# Patient Record
Sex: Female | Born: 1945
Health system: Southern US, Community
[De-identification: ages and names within clinical notes are randomized; demographics above are authoritative.]

## PROBLEM LIST (undated history)

## (undated) DIAGNOSIS — K5790 Diverticulosis of intestine, part unspecified, without perforation or abscess without bleeding: Secondary | ICD-10-CM

## (undated) DIAGNOSIS — I10 Essential (primary) hypertension: Secondary | ICD-10-CM

## (undated) DIAGNOSIS — I219 Acute myocardial infarction, unspecified: Secondary | ICD-10-CM

## (undated) DIAGNOSIS — I639 Cerebral infarction, unspecified: Secondary | ICD-10-CM

## (undated) DIAGNOSIS — I739 Peripheral vascular disease, unspecified: Secondary | ICD-10-CM

## (undated) DIAGNOSIS — D509 Iron deficiency anemia, unspecified: Secondary | ICD-10-CM

## (undated) DIAGNOSIS — E785 Hyperlipidemia, unspecified: Secondary | ICD-10-CM

## (undated) DIAGNOSIS — M549 Dorsalgia, unspecified: Secondary | ICD-10-CM

## (undated) DIAGNOSIS — N182 Chronic kidney disease, stage 2 (mild): Secondary | ICD-10-CM

## (undated) DIAGNOSIS — Z9289 Personal history of other medical treatment: Secondary | ICD-10-CM

## (undated) DIAGNOSIS — G4700 Insomnia, unspecified: Secondary | ICD-10-CM

## (undated) DIAGNOSIS — N289 Disorder of kidney and ureter, unspecified: Secondary | ICD-10-CM

## (undated) DIAGNOSIS — I779 Disorder of arteries and arterioles, unspecified: Secondary | ICD-10-CM

## (undated) DIAGNOSIS — I5032 Chronic diastolic (congestive) heart failure: Secondary | ICD-10-CM

## (undated) DIAGNOSIS — K219 Gastro-esophageal reflux disease without esophagitis: Secondary | ICD-10-CM

## (undated) DIAGNOSIS — Z720 Tobacco use: Secondary | ICD-10-CM

## (undated) DIAGNOSIS — K648 Other hemorrhoids: Secondary | ICD-10-CM

## (undated) DIAGNOSIS — N183 Chronic kidney disease, stage 3 unspecified: Secondary | ICD-10-CM

## (undated) DIAGNOSIS — J45909 Unspecified asthma, uncomplicated: Secondary | ICD-10-CM

## (undated) DIAGNOSIS — K922 Gastrointestinal hemorrhage, unspecified: Secondary | ICD-10-CM

## (undated) DIAGNOSIS — I2510 Atherosclerotic heart disease of native coronary artery without angina pectoris: Secondary | ICD-10-CM

## (undated) DIAGNOSIS — G8929 Other chronic pain: Secondary | ICD-10-CM

## (undated) HISTORY — DX: Other hemorrhoids: K64.8

## (undated) HISTORY — DX: Dorsalgia, unspecified: M54.9

## (undated) HISTORY — DX: Hyperlipidemia, unspecified: E78.5

## (undated) HISTORY — DX: Other chronic pain: G89.29

## (undated) HISTORY — DX: Peripheral vascular disease, unspecified: I73.9

## (undated) HISTORY — DX: Diverticulosis of intestine, part unspecified, without perforation or abscess without bleeding: K57.90

## (undated) HISTORY — DX: Essential (primary) hypertension: I10

## (undated) HISTORY — DX: Iron deficiency anemia, unspecified: D50.9

## (undated) HISTORY — DX: Gastro-esophageal reflux disease without esophagitis: K21.9

## (undated) HISTORY — PX: TONSILLECTOMY: SUR1361

## (undated) HISTORY — DX: Insomnia, unspecified: G47.00

---

## 1973-04-02 DIAGNOSIS — Z87442 Personal history of urinary calculi: Secondary | ICD-10-CM

## 1973-04-02 HISTORY — PX: CHOLECYSTECTOMY: SHX55

## 1973-04-02 HISTORY — DX: Personal history of urinary calculi: Z87.442

## 1997-07-12 ENCOUNTER — Encounter: Admission: RE | Admit: 1997-07-12 | Discharge: 1997-07-12 | Payer: Self-pay | Admitting: Internal Medicine

## 1997-07-27 ENCOUNTER — Ambulatory Visit: Admission: RE | Admit: 1997-07-27 | Discharge: 1997-07-27 | Payer: Self-pay | Admitting: Internal Medicine

## 1997-08-25 ENCOUNTER — Encounter: Admission: RE | Admit: 1997-08-25 | Discharge: 1997-08-25 | Payer: Self-pay | Admitting: Hematology and Oncology

## 1997-09-03 ENCOUNTER — Other Ambulatory Visit: Admission: RE | Admit: 1997-09-03 | Discharge: 1997-09-03 | Payer: Self-pay | Admitting: Internal Medicine

## 2000-06-22 ENCOUNTER — Encounter: Payer: Self-pay | Admitting: Neurosurgery

## 2000-06-22 ENCOUNTER — Ambulatory Visit (HOSPITAL_COMMUNITY): Admission: RE | Admit: 2000-06-22 | Discharge: 2000-06-22 | Payer: Self-pay | Admitting: Neurosurgery

## 2004-05-28 ENCOUNTER — Emergency Department (HOSPITAL_COMMUNITY): Admission: EM | Admit: 2004-05-28 | Discharge: 2004-05-28 | Payer: Self-pay | Admitting: Emergency Medicine

## 2004-06-01 ENCOUNTER — Ambulatory Visit: Payer: Self-pay | Admitting: Family Medicine

## 2004-06-02 ENCOUNTER — Ambulatory Visit: Payer: Self-pay | Admitting: Family Medicine

## 2004-06-05 ENCOUNTER — Ambulatory Visit: Payer: Self-pay | Admitting: Family Medicine

## 2004-06-07 ENCOUNTER — Ambulatory Visit: Payer: Self-pay | Admitting: Family Medicine

## 2004-06-08 ENCOUNTER — Ambulatory Visit: Payer: Self-pay | Admitting: *Deleted

## 2004-06-28 ENCOUNTER — Ambulatory Visit: Payer: Self-pay | Admitting: Family Medicine

## 2004-07-13 ENCOUNTER — Ambulatory Visit: Payer: Self-pay | Admitting: Internal Medicine

## 2004-07-20 ENCOUNTER — Ambulatory Visit: Payer: Self-pay | Admitting: Family Medicine

## 2004-07-24 ENCOUNTER — Ambulatory Visit: Payer: Self-pay | Admitting: Family Medicine

## 2004-07-26 ENCOUNTER — Ambulatory Visit (HOSPITAL_COMMUNITY): Admission: RE | Admit: 2004-07-26 | Discharge: 2004-07-26 | Payer: Self-pay | Admitting: Internal Medicine

## 2004-08-18 ENCOUNTER — Ambulatory Visit: Payer: Self-pay | Admitting: Family Medicine

## 2005-05-17 ENCOUNTER — Ambulatory Visit: Payer: Self-pay | Admitting: Internal Medicine

## 2005-06-14 ENCOUNTER — Ambulatory Visit: Payer: Self-pay | Admitting: Family Medicine

## 2006-05-28 ENCOUNTER — Ambulatory Visit: Payer: Self-pay | Admitting: Internal Medicine

## 2006-06-11 ENCOUNTER — Ambulatory Visit: Payer: Self-pay | Admitting: Internal Medicine

## 2006-12-18 ENCOUNTER — Encounter (INDEPENDENT_AMBULATORY_CARE_PROVIDER_SITE_OTHER): Payer: Self-pay | Admitting: *Deleted

## 2007-02-26 ENCOUNTER — Ambulatory Visit: Payer: Self-pay | Admitting: Internal Medicine

## 2007-04-03 DIAGNOSIS — I219 Acute myocardial infarction, unspecified: Secondary | ICD-10-CM

## 2007-04-03 HISTORY — DX: Acute myocardial infarction, unspecified: I21.9

## 2007-08-05 ENCOUNTER — Inpatient Hospital Stay (HOSPITAL_COMMUNITY): Admission: EM | Admit: 2007-08-05 | Discharge: 2007-08-06 | Payer: Self-pay | Admitting: Emergency Medicine

## 2007-08-05 DIAGNOSIS — Z9861 Coronary angioplasty status: Secondary | ICD-10-CM

## 2007-08-05 DIAGNOSIS — I2510 Atherosclerotic heart disease of native coronary artery without angina pectoris: Secondary | ICD-10-CM

## 2007-08-05 HISTORY — DX: Atherosclerotic heart disease of native coronary artery without angina pectoris: I25.10

## 2007-08-05 HISTORY — PX: CORONARY ANGIOPLASTY WITH STENT PLACEMENT: SHX49

## 2007-09-09 ENCOUNTER — Encounter (INDEPENDENT_AMBULATORY_CARE_PROVIDER_SITE_OTHER): Payer: Self-pay | Admitting: Internal Medicine

## 2007-10-31 ENCOUNTER — Ambulatory Visit: Payer: Self-pay | Admitting: Internal Medicine

## 2007-10-31 DIAGNOSIS — J45909 Unspecified asthma, uncomplicated: Secondary | ICD-10-CM

## 2007-10-31 DIAGNOSIS — I10 Essential (primary) hypertension: Secondary | ICD-10-CM

## 2007-10-31 HISTORY — DX: Unspecified asthma, uncomplicated: J45.909

## 2007-11-11 ENCOUNTER — Encounter (INDEPENDENT_AMBULATORY_CARE_PROVIDER_SITE_OTHER): Payer: Self-pay | Admitting: Internal Medicine

## 2007-12-30 ENCOUNTER — Encounter (INDEPENDENT_AMBULATORY_CARE_PROVIDER_SITE_OTHER): Payer: Self-pay | Admitting: Internal Medicine

## 2007-12-30 ENCOUNTER — Ambulatory Visit: Payer: Self-pay | Admitting: Internal Medicine

## 2007-12-30 DIAGNOSIS — F329 Major depressive disorder, single episode, unspecified: Secondary | ICD-10-CM

## 2007-12-30 LAB — CONVERTED CEMR LAB
AST: 13 units/L (ref 0–37)
Albumin: 4.1 g/dL (ref 3.5–5.2)
BUN: 21 mg/dL (ref 6–23)
Basophils Absolute: 0 10*3/uL (ref 0.0–0.1)
Blood in Urine, dipstick: NEGATIVE
CO2: 20 meq/L (ref 19–32)
Chlamydia, DNA Probe: NEGATIVE
Chloride: 103 meq/L (ref 96–112)
GC Probe Amp, Genital: NEGATIVE
Glucose, Urine, Semiquant: NEGATIVE
HCT: 40.4 % (ref 36.0–46.0)
Hemoglobin: 12.9 g/dL (ref 12.0–15.0)
KOH Prep: NEGATIVE
Ketones, urine, test strip: NEGATIVE
Lymphs Abs: 2.9 10*3/uL (ref 0.7–4.0)
MCHC: 31.9 g/dL (ref 30.0–36.0)
Monocytes Absolute: 0.7 10*3/uL (ref 0.1–1.0)
Monocytes Relative: 8 % (ref 3–12)
Neutrophils Relative %: 59 % (ref 43–77)
RBC: 4.97 M/uL (ref 3.87–5.11)
Total Bilirubin: 0.7 mg/dL (ref 0.3–1.2)
Total CHOL/HDL Ratio: 3.2
Total Protein: 7.9 g/dL (ref 6.0–8.3)
Urobilinogen, UA: 0.2
VLDL: 23 mg/dL (ref 0–40)
pH: 5.5

## 2008-01-07 ENCOUNTER — Ambulatory Visit (HOSPITAL_COMMUNITY): Admission: RE | Admit: 2008-01-07 | Discharge: 2008-01-07 | Payer: Self-pay | Admitting: Internal Medicine

## 2008-01-11 ENCOUNTER — Encounter (INDEPENDENT_AMBULATORY_CARE_PROVIDER_SITE_OTHER): Payer: Self-pay | Admitting: Internal Medicine

## 2008-01-12 ENCOUNTER — Encounter (INDEPENDENT_AMBULATORY_CARE_PROVIDER_SITE_OTHER): Payer: Self-pay | Admitting: Internal Medicine

## 2008-01-26 ENCOUNTER — Encounter (INDEPENDENT_AMBULATORY_CARE_PROVIDER_SITE_OTHER): Payer: Self-pay | Admitting: Internal Medicine

## 2008-02-12 ENCOUNTER — Encounter (INDEPENDENT_AMBULATORY_CARE_PROVIDER_SITE_OTHER): Payer: Self-pay | Admitting: Internal Medicine

## 2008-02-19 ENCOUNTER — Encounter (INDEPENDENT_AMBULATORY_CARE_PROVIDER_SITE_OTHER): Payer: Self-pay | Admitting: Internal Medicine

## 2008-07-20 ENCOUNTER — Ambulatory Visit: Payer: Self-pay | Admitting: Internal Medicine

## 2008-07-20 DIAGNOSIS — R233 Spontaneous ecchymoses: Secondary | ICD-10-CM

## 2008-07-20 DIAGNOSIS — R1013 Epigastric pain: Secondary | ICD-10-CM

## 2008-07-30 LAB — CONVERTED CEMR LAB
ALT: 8 U/L (ref 0–35)
AST: 14 U/L (ref 0–37)
Albumin: 4.2 g/dL (ref 3.5–5.2)
Alkaline Phosphatase: 80 U/L (ref 39–117)
BUN: 29 mg/dL — ABNORMAL HIGH (ref 6–23)
Basophils Absolute: 0 K/uL (ref 0.0–0.1)
Basophils Relative: 0 % (ref 0–1)
CO2: 19 meq/L (ref 19–32)
Calcium: 9.4 mg/dL (ref 8.4–10.5)
Chloride: 109 meq/L (ref 96–112)
Cholesterol: 142 mg/dL (ref 0–200)
Creatinine, Ser: 1.47 mg/dL — ABNORMAL HIGH (ref 0.40–1.20)
Eosinophils Absolute: 0.2 K/uL (ref 0.0–0.7)
Eosinophils Relative: 3 % (ref 0–5)
Glucose, Bld: 95 mg/dL (ref 70–99)
HCT: 36.4 % (ref 36.0–46.0)
HDL: 44 mg/dL (ref >39)
Hemoglobin: 11.7 g/dL — ABNORMAL LOW (ref 12.0–15.0)
INR: 1.1 (ref 0.0–1.5)
LDL Cholesterol: 71 mg/dL (ref 0–99)
Lymphocytes Relative: 31 % (ref 12–46)
Lymphs Abs: 2.6 K/uL (ref 0.7–4.0)
MCHC: 32.1 g/dL (ref 30.0–36.0)
MCV: 82.5 fL (ref 78.0–100.0)
Monocytes Absolute: 0.6 K/uL (ref 0.1–1.0)
Monocytes Relative: 7 % (ref 3–12)
Neutro Abs: 5.1 K/uL (ref 1.7–7.7)
Neutrophils Relative %: 60 % (ref 43–77)
Platelets: 210 K/uL (ref 150–400)
Potassium: 5 meq/L (ref 3.5–5.3)
Prothrombin Time: 14.1 s (ref 11.6–15.2)
RBC: 4.41 M/uL (ref 3.87–5.11)
RDW: 16.7 % — ABNORMAL HIGH (ref 11.5–15.5)
Sodium: 141 meq/L (ref 135–145)
Total Bilirubin: 0.6 mg/dL (ref 0.3–1.2)
Total CHOL/HDL Ratio: 3.2
Total Protein: 7.9 g/dL (ref 6.0–8.3)
Triglycerides: 135 mg/dL (ref <150)
VLDL: 27 mg/dL (ref 0–40)
WBC: 8.5 10*3/microliter (ref 4.0–10.5)
aPTT: 36 s (ref 24–37)

## 2008-08-03 ENCOUNTER — Ambulatory Visit: Payer: Self-pay | Admitting: Internal Medicine

## 2008-08-09 LAB — CONVERTED CEMR LAB
BUN: 41 mg/dL — ABNORMAL HIGH (ref 6–23)
Calcium: 9.2 mg/dL (ref 8.4–10.5)
Creatinine, Ser: 1.72 mg/dL — ABNORMAL HIGH (ref 0.40–1.20)
Ferritin: 289 ng/mL (ref 10–291)
Potassium: 4.5 meq/L (ref 3.5–5.3)
Sodium: 140 meq/L (ref 135–145)
TIBC: 303 ug/dL (ref 250–470)
UIBC: 266 ug/dL

## 2008-08-10 ENCOUNTER — Ambulatory Visit: Payer: Self-pay | Admitting: Internal Medicine

## 2008-08-10 LAB — CONVERTED CEMR LAB
Glucose, Urine, Semiquant: NEGATIVE
Ketones, urine, test strip: NEGATIVE
Nitrite: NEGATIVE
WBC Urine, dipstick: NEGATIVE

## 2008-08-12 ENCOUNTER — Ambulatory Visit: Payer: Self-pay | Admitting: Internal Medicine

## 2008-08-13 ENCOUNTER — Encounter (INDEPENDENT_AMBULATORY_CARE_PROVIDER_SITE_OTHER): Payer: Self-pay | Admitting: Internal Medicine

## 2008-08-15 DIAGNOSIS — N182 Chronic kidney disease, stage 2 (mild): Secondary | ICD-10-CM | POA: Insufficient documentation

## 2008-08-17 ENCOUNTER — Ambulatory Visit: Payer: Self-pay | Admitting: Internal Medicine

## 2008-08-20 ENCOUNTER — Encounter: Payer: Self-pay | Admitting: Internal Medicine

## 2008-08-20 ENCOUNTER — Ambulatory Visit: Admission: RE | Admit: 2008-08-20 | Discharge: 2008-08-20 | Payer: Self-pay | Admitting: Internal Medicine

## 2008-08-20 ENCOUNTER — Ambulatory Visit: Payer: Self-pay | Admitting: Vascular Surgery

## 2008-08-20 ENCOUNTER — Ambulatory Visit (HOSPITAL_COMMUNITY): Admission: RE | Admit: 2008-08-20 | Discharge: 2008-08-20 | Payer: Self-pay | Admitting: Internal Medicine

## 2008-08-23 ENCOUNTER — Telehealth (INDEPENDENT_AMBULATORY_CARE_PROVIDER_SITE_OTHER): Payer: Self-pay | Admitting: Internal Medicine

## 2008-09-07 ENCOUNTER — Encounter (INDEPENDENT_AMBULATORY_CARE_PROVIDER_SITE_OTHER): Payer: Self-pay | Admitting: Internal Medicine

## 2008-09-15 ENCOUNTER — Encounter (INDEPENDENT_AMBULATORY_CARE_PROVIDER_SITE_OTHER): Payer: Self-pay | Admitting: Internal Medicine

## 2008-09-15 ENCOUNTER — Ambulatory Visit: Payer: Self-pay | Admitting: Internal Medicine

## 2008-09-23 ENCOUNTER — Ambulatory Visit: Payer: Self-pay | Admitting: Internal Medicine

## 2008-09-23 DIAGNOSIS — D509 Iron deficiency anemia, unspecified: Secondary | ICD-10-CM | POA: Insufficient documentation

## 2008-09-23 DIAGNOSIS — M549 Dorsalgia, unspecified: Secondary | ICD-10-CM

## 2008-09-23 DIAGNOSIS — G8929 Other chronic pain: Secondary | ICD-10-CM

## 2008-12-03 ENCOUNTER — Encounter (INDEPENDENT_AMBULATORY_CARE_PROVIDER_SITE_OTHER): Payer: Self-pay | Admitting: Family Medicine

## 2008-12-21 ENCOUNTER — Ambulatory Visit: Payer: Self-pay | Admitting: Internal Medicine

## 2008-12-21 DIAGNOSIS — R252 Cramp and spasm: Secondary | ICD-10-CM | POA: Insufficient documentation

## 2008-12-21 LAB — CONVERTED CEMR LAB
ALT: 9 units/L (ref 0–35)
BUN: 27 mg/dL — ABNORMAL HIGH (ref 6–23)
CO2: 22 meq/L (ref 19–32)
Calcium: 9.2 mg/dL (ref 8.4–10.5)
Creatinine, Ser: 1.58 mg/dL — ABNORMAL HIGH (ref 0.40–1.20)
Glucose, Bld: 79 mg/dL (ref 70–99)
Lymphs Abs: 2.7 10*3/uL (ref 0.7–4.0)
MCV: 84.4 fL (ref 78.0–100.0)
Monocytes Absolute: 0.7 10*3/uL (ref 0.1–1.0)
Monocytes Relative: 8 % (ref 3–12)
Neutro Abs: 4.8 10*3/uL (ref 1.7–7.7)
Sodium: 141 meq/L (ref 135–145)
Total Bilirubin: 0.6 mg/dL (ref 0.3–1.2)

## 2008-12-29 ENCOUNTER — Encounter (INDEPENDENT_AMBULATORY_CARE_PROVIDER_SITE_OTHER): Payer: Self-pay | Admitting: Internal Medicine

## 2009-01-11 ENCOUNTER — Encounter (INDEPENDENT_AMBULATORY_CARE_PROVIDER_SITE_OTHER): Payer: Self-pay | Admitting: Internal Medicine

## 2009-02-02 ENCOUNTER — Ambulatory Visit (HOSPITAL_COMMUNITY): Admission: RE | Admit: 2009-02-02 | Discharge: 2009-02-02 | Payer: Self-pay | Admitting: Internal Medicine

## 2009-02-04 ENCOUNTER — Ambulatory Visit: Payer: Self-pay | Admitting: Internal Medicine

## 2009-06-07 ENCOUNTER — Ambulatory Visit: Payer: Self-pay | Admitting: Internal Medicine

## 2009-06-07 DIAGNOSIS — E041 Nontoxic single thyroid nodule: Secondary | ICD-10-CM

## 2009-06-07 LAB — CONVERTED CEMR LAB
AST: 12 units/L (ref 0–37)
Alkaline Phosphatase: 73 units/L (ref 39–117)
Bilirubin Urine: NEGATIVE
Blood in Urine, dipstick: NEGATIVE
Calcium: 9.6 mg/dL (ref 8.4–10.5)
Chlamydia, DNA Probe: NEGATIVE
Cholesterol: 154 mg/dL (ref 0–200)
Eosinophils Absolute: 0.2 10*3/uL (ref 0.0–0.7)
Eosinophils Relative: 2 % (ref 0–5)
Glucose, Bld: 78 mg/dL (ref 70–99)
Glucose, Urine, Semiquant: NEGATIVE
HDL: 53 mg/dL (ref >39)
Hemoglobin: 12.7 g/dL (ref 12.0–15.0)
Ketones, urine, test strip: NEGATIVE
Lymphocytes Relative: 33 % (ref 12–46)
Monocytes Absolute: 0.7 10*3/uL (ref 0.1–1.0)
Monocytes Relative: 9 % (ref 3–12)
Neutro Abs: 4.2 10*3/uL (ref 1.7–7.7)
Neutrophils Relative %: 56 % (ref 43–77)
Platelets: 207 10*3/uL (ref 150–400)
Sodium: 142 meq/L (ref 135–145)
Total Bilirubin: 0.5 mg/dL (ref 0.3–1.2)
Total Protein: 7.7 g/dL (ref 6.0–8.3)
Urobilinogen, UA: 0.2
pH: 5

## 2009-06-10 ENCOUNTER — Ambulatory Visit (HOSPITAL_COMMUNITY): Admission: RE | Admit: 2009-06-10 | Discharge: 2009-06-10 | Payer: Self-pay | Admitting: Internal Medicine

## 2009-06-14 ENCOUNTER — Ambulatory Visit (HOSPITAL_COMMUNITY)
Admission: RE | Admit: 2009-06-14 | Discharge: 2009-06-14 | Payer: Self-pay | Source: Home / Self Care | Admitting: Internal Medicine

## 2009-06-26 ENCOUNTER — Encounter (INDEPENDENT_AMBULATORY_CARE_PROVIDER_SITE_OTHER): Payer: Self-pay | Admitting: Internal Medicine

## 2009-10-07 ENCOUNTER — Ambulatory Visit: Payer: Self-pay | Admitting: Internal Medicine

## 2009-10-07 DIAGNOSIS — G4700 Insomnia, unspecified: Secondary | ICD-10-CM

## 2010-01-10 ENCOUNTER — Ambulatory Visit: Payer: Self-pay | Admitting: Internal Medicine

## 2010-02-04 LAB — CONVERTED CEMR LAB
AST: 15 units/L (ref 0–37)
Albumin: 4.2 g/dL (ref 3.5–5.2)
Alkaline Phosphatase: 72 units/L (ref 39–117)
BUN: 25 mg/dL — ABNORMAL HIGH (ref 6–23)
Basophils Absolute: 0 10*3/uL (ref 0.0–0.1)
Basophils Relative: 0 % (ref 0–1)
CO2: 23 meq/L (ref 19–32)
Calcium: 9.9 mg/dL (ref 8.4–10.5)
Chloride: 106 meq/L (ref 96–112)
Creatinine, Ser: 1.63 mg/dL — ABNORMAL HIGH (ref 0.40–1.20)
Eosinophils Relative: 5 % (ref 0–5)
Glucose, Bld: 101 mg/dL — ABNORMAL HIGH (ref 70–99)
Lipase: 60 units/L (ref 0–75)
Lymphocytes Relative: 37 % (ref 12–46)
MCV: 85.6 fL (ref 78.0–100.0)
Monocytes Relative: 9 % (ref 3–12)
RDW: 16.3 % — ABNORMAL HIGH (ref 11.5–15.5)
Sodium: 141 meq/L (ref 135–145)
Total Bilirubin: 0.6 mg/dL (ref 0.3–1.2)
Total Protein: 7.5 g/dL (ref 6.0–8.3)

## 2010-03-14 ENCOUNTER — Telehealth (INDEPENDENT_AMBULATORY_CARE_PROVIDER_SITE_OTHER): Payer: Self-pay | Admitting: Internal Medicine

## 2010-03-14 ENCOUNTER — Ambulatory Visit: Payer: Self-pay | Admitting: Internal Medicine

## 2010-04-05 ENCOUNTER — Encounter (INDEPENDENT_AMBULATORY_CARE_PROVIDER_SITE_OTHER): Payer: Self-pay | Admitting: Internal Medicine

## 2010-04-11 ENCOUNTER — Ambulatory Visit (HOSPITAL_COMMUNITY)
Admission: RE | Admit: 2010-04-11 | Discharge: 2010-04-11 | Payer: Self-pay | Source: Home / Self Care | Attending: Gastroenterology | Admitting: Gastroenterology

## 2010-04-11 DIAGNOSIS — K648 Other hemorrhoids: Secondary | ICD-10-CM | POA: Insufficient documentation

## 2010-04-11 DIAGNOSIS — K5790 Diverticulosis of intestine, part unspecified, without perforation or abscess without bleeding: Secondary | ICD-10-CM

## 2010-04-11 DIAGNOSIS — K5730 Diverticulosis of large intestine without perforation or abscess without bleeding: Secondary | ICD-10-CM | POA: Insufficient documentation

## 2010-04-11 HISTORY — DX: Diverticulosis of intestine, part unspecified, without perforation or abscess without bleeding: K57.90

## 2010-04-11 HISTORY — DX: Other hemorrhoids: K64.8

## 2010-04-11 HISTORY — PX: COLONOSCOPY: SHX174

## 2010-05-04 NOTE — Assessment & Plan Note (Signed)
Summary: 2 MONTH FU ON EPIGASTIC PAIN//KT   Vital Signs:  Patient profile:   64 year old female Menstrual status:  postmenopausal Height:      60 inches Weight:      165 pounds BMI:     32.34 Temp:     97.0 degrees F oral Pulse rate:   72 / minute Pulse rhythm:   regular Resp:     18 per minute BP sitting:   118 / 80  (left arm) Cuff size:   regular  Vitals Entered By: [place] Shannon (March 14, 2010 8:22 AM) CC: f/u on gastric pain... Is Patient Diabetic? No Pain Assessment Patient in pain? no       Does patient need assistance? Functional Status Self care Ambulation Normal   CC:  f/u on gastric pain....  History of Present Illness: 1.  Epigastric pain:  resolved with increase in Famotidine.  Did stop smoking and elevated HOB.  Otherwise, no other dietary changes.    2.  Mild hyperglycemia:  pt. states she was not fasting at last lab visit.  Did have a piece of peppermint candy today.  3.  Family colon cancer and breast cancer hx:  pt. inquiring about whether she needs to be screened more than once yearly with mammogram.  Extensive family hx of breast ca--pt. not sure if genetic testing has been done.  Pt. has not been able to get colonoscopy secondary to expense.  4.  Cough and chest tightness:  Using Ventolin once daily in recent weeks.  Always seems congested.  Lot of posterior pharyngeal drainage, runny  nose.  No sneezing    Current Medications (verified): 1)  Toprol Xl 100 Mg  Xr24h-Tab (Metoprolol Succinate) .Marland Kitchen.. 1 1/2 Tabs By Mouth Daily 2)  Cozaar 100 Mg  Tabs (Losartan Potassium) .Marland Kitchen.. 1 Tab Daily 3)  Plavix 75 Mg  Tabs (Clopidogrel Bisulfate) .Marland Kitchen.. 1 Tab Daily 4)  Norvasc 5 Mg  Tabs (Amlodipine Besylate) .Marland Kitchen.. 1 Tab Daily 5)  Lipitor 20 Mg  Tabs (Atorvastatin Calcium) .Marland Kitchen.. 1 Tab Daily 6)  Hydrochlorothiazide 25 Mg  Tabs (Hydrochlorothiazide) .Marland Kitchen.. 1 Tab Daily 7)  Bayer Aspirin Ec Low Dose 81 Mg  Tbec (Aspirin) .Marland Kitchen.. 1 Tab Daily 8)  Advair Diskus 100-50  Mcg/dose  Misc (Fluticasone-Salmeterol) .Marland Kitchen.. 1 Inhalation Two Times A Day 9)  Ventolin Hfa 108 (90 Base) Mcg/act Aers (Albuterol Sulfate) .... 2 Puffs Every 6 Hours As Needed For Wheezing 10)  Wellbutrin Sr 150 Mg Xr12h-Tab (Bupropion Hcl) .Marland Kitchen.. 1 Tab By Mouth Q A.m. For 3 Days, Then 1 Tab By Mouth Two Times A Day 11)  Famotidine 40 Mg Tabs (Famotidine) .Marland Kitchen.. 1 Tab By Mouth Two Times A Day -Take 1/2 Hour Before Breakfast and Dinner 12)  Ferrous Sulfate 325 (65 Fe) Mg Tabs (Ferrous Sulfate) .Marland Kitchen.. 1 Tab By Mouth Daily 13)  Oyster Shell Calcium/d 500-200 Mg-Unit Tabs (Calcium-Vitamin D) .Marland Kitchen.. 1 Tab By Mouth Two Times A Day 14)  Trazodone Hcl 50 Mg Tabs (Trazodone Hcl) .Marland Kitchen.. 1-2 Tabs By Mouth At Bedtime For Sleep  Allergies (verified): No Known Drug Allergies  Family History: Mother, 5:  DM, hypertension, CAD, colon cancer at age 50.  Now diagnosed with bilateral breast cancer at 39.   Father, died 67:  pneumonia 7 Sisters:  1 with DM, hypertension, CAD.  Another sister with breast cancer at age 14.  Currently 26 5 Brothers: 1 Brother died from unknown cancer, age 75--last week. Son, 76:  Healthy Breast cancer in  maternal great aunt, age 16 +;  Maternal 1st cousin with breast cancer in 62s (postmenopausal), Sister, in her 59s and another sister at age 76 also with breast cancer.  Sisters and mother still living.  More distant family members died from breast cancer.  Physical Exam  General:  NAD Eyes:  No corneal or conjunctival inflammation noted. EOMI. Perrla. Funduscopic exam benign, without hemorrhages, exudates or papilledema. Vision grossly normal. Ears:  External ear exam shows no significant lesions or deformities.  Otoscopic examination reveals clear canals, tympanic membranes are intact bilaterally without bulging, retraction, inflammation or discharge. Hearing is grossly normal bilaterally. Nose:  Clear discharge, mild turbinate swelling. Mouth:  pharynx pink and moist.   Neck:  No  deformities, masses, or tenderness noted. Lungs:  Decreased BS throughout, but without wheeze or crackles Heart:  Normal rate and regular rhythm. S1 and S2 normal without gallop, murmur, click, rub or other extra sounds. Abdomen:  Bowel sounds positive,abdomen soft and non-tender without masses, organomegaly or hernias noted.   Impression & Recommendations:  Problem # 1:  TOBACCO ABUSE (ICD-305.1) Quit!  Problem # 2:  ADENOCARCINOMA, COLON, FAMILY HX (ICD-V16.0) Will check again with referrals to see if can at least get listed for very low cost colonoscopy. Pt. cannot afford the $1800 bill she states she has been quoted each time. States she tried to work out a Agricultural consultant, but GI office wanted up front.  Problem # 3:  EPIGASTRIC PAIN (ICD-789.06) Resolved with d/c of smoking and increase of Famotidine--continue  Problem # 4:  ASTHMA (ICD-493.90) Probable intermittent URI vs allergies--add Cetirizine. Her updated medication list for this problem includes:    Advair Diskus 100-50 Mcg/dose Misc (Fluticasone-salmeterol) .Marland Kitchen... 1 inhalation two times a day    Ventolin Hfa 108 (90 Base) Mcg/act Aers (Albuterol sulfate) .Marland Kitchen... 2 puffs every 6 hours as needed for wheezing  Problem # 5:  NEOPLASM, MALIGNANT, BREAST, FAMILY HX, MOTHER AND 2 SISTERS (ICD-V16.3) Pt. to check with sisters and mother to see if any genetic testing done.  Complete Medication List: 1)  Toprol Xl 100 Mg Xr24h-tab (Metoprolol succinate) .Marland Kitchen.. 1 1/2 tabs by mouth daily 2)  Cozaar 100 Mg Tabs (Losartan potassium) .Marland Kitchen.. 1 tab daily 3)  Plavix 75 Mg Tabs (Clopidogrel bisulfate) .Marland Kitchen.. 1 tab daily 4)  Norvasc 5 Mg Tabs (Amlodipine besylate) .Marland Kitchen.. 1 tab daily 5)  Lipitor 20 Mg Tabs (Atorvastatin calcium) .Marland Kitchen.. 1 tab daily 6)  Hydrochlorothiazide 25 Mg Tabs (Hydrochlorothiazide) .Marland Kitchen.. 1 tab daily 7)  Bayer Aspirin Ec Low Dose 81 Mg Tbec (Aspirin) .Marland Kitchen.. 1 tab daily 8)  Advair Diskus 100-50 Mcg/dose Misc (Fluticasone-salmeterol)  .Marland Kitchen.. 1 inhalation two times a day 9)  Ventolin Hfa 108 (90 Base) Mcg/act Aers (Albuterol sulfate) .... 2 puffs every 6 hours as needed for wheezing 10)  Wellbutrin Sr 150 Mg Xr12h-tab (Bupropion hcl) .Marland Kitchen.. 1 tab by mouth q a.m. for 3 days, then 1 tab by mouth two times a day 11)  Famotidine 40 Mg Tabs (Famotidine) .Marland Kitchen.. 1 tab by mouth two times a day -take 1/2 hour before breakfast and dinner 12)  Ferrous Sulfate 325 (65 Fe) Mg Tabs (Ferrous sulfate) .Marland Kitchen.. 1 tab by mouth daily 13)  Oyster Shell Calcium/d 500-200 Mg-unit Tabs (Calcium-vitamin d) .Marland Kitchen.. 1 tab by mouth two times a day 14)  Trazodone Hcl 50 Mg Tabs (Trazodone hcl) .Marland Kitchen.. 1-2 tabs by mouth at bedtime for sleep 15)  Cetirizine Hcl 10 Mg Tabs (Cetirizine hcl) .Marland Kitchen.. 1 tab by mouth  daily  Patient Instructions: 1)  CPP with Dr. Amil Amen after 06/08/10 Prescriptions: CETIRIZINE HCL 10 MG TABS (CETIRIZINE HCL) 1 tab by mouth daily  #30 x 11   Entered and Authorized by:   Mack Hook MD   Signed by:   Mack Hook MD on 03/14/2010   Method used:   Faxed to ...       [facility] (retail)       [place], [place]  [address]       Ph: [phone] [place]       Fax: [phone]   RxID:   [id number]    Orders Added: 1)  Est. Patient Level III [id number]  Appended Document: 2 MONTH FU ON EPIGASTIC PAIN//KT    Clinical Lists Changes  Observations: Added new observation of FAMILY HX: Mother, 61:  DM, hypertension, CAD, colon cancer at age 56.  Now diagnosed with bilateral breast cancer at 63.   Father, died 57:  pneumonia 7 Sisters:  1 with DM, hypertension, CAD.  Another sister with breast cancer at age 40.  Currently 58 5 Brothers: 1 Brother died from unknown cancer, age 50--last week. Son, 79:  Healthy Breast cancer in maternal great aunt, age 43 +;  Maternal 1st cousin with breast cancer in 61s (postmenopausal), Sister, in her 76s and another sister at age 61 also with  breast cancer.  Sisters and mother still living.  More distant family members died from breast cancer.  No family history of ovarian cancer per pt.  (03/14/2010 9:20)          Family History: Mother, 74:  DM, hypertension, CAD, colon cancer at age 42.  Now diagnosed with bilateral breast cancer at 64.   Father, died 43:  pneumonia 7 Sisters:  1 with DM, hypertension, CAD.  Another sister with breast cancer at age 50.  Currently 52 5 Brothers: 1 Brother died from unknown cancer, age 47--last week. Son, 31:  Healthy Breast cancer in maternal great aunt, age 12 +;  Maternal 1st cousin with breast cancer in 84s (postmenopausal), Sister, in her 60s and another sister at age 64 also with breast cancer.  Sisters and mother still living.  More distant family members died from breast cancer.  No family history of ovarian cancer per pt.

## 2010-05-04 NOTE — Letter (Signed)
Summary: Lipid Letter  [facility]  [address] [place], [place] [address]   Phone: [phone]  Fax: [phone]    06/26/2009  [address] [place], [place]  [address]  Dear Ms. Mckeen:  We have carefully reviewed your last lipid profile from 06/07/2009 and the results are noted below with a summary of recommendations for lipid management.    Cholesterol:       154     Goal: <200   HDL "good" Cholesterol:   53     Goal: >45   LDL "bad" Cholesterol:   80     Goal: <70   Triglycerides:       107     Goal: <150    With your heart history, would like to see your LDL less than 70--make sure you are doing everything you can with a healthy low fat diet and exercise.  Recommend rechecking your fasting cholesterol again in 4 months--please call to schedule that.  Your kidneys appear to be stable and the rest of you labs, stool cards were all okay.  Your pap showed some changes that cause [place] to check for the virus that can make changes to the cervix--the test for the virus on your pap was negative, so no need to worry about that--just recommend redoing your pap in a year.    TLC Diet (Therapeutic Lifestyle Change): Saturated Fats & Transfatty acids should be kept < 7% of total calories ***Reduce Saturated Fats Polyunstaurated Fat can be up to 10% of total calories Monounsaturated Fat Fat can be up to 20% of total calories Total Fat should be no greater than 25-35% of total calories Carbohydrates should be 50-60% of total calories Protein should be approximately 15% of total calories Fiber should be at least 20-30 grams a day ***Increased fiber may help lower LDL Total Cholesterol should be < 200mg /day Consider adding plant stanol/sterols to diet (example: Benacol spread) ***A higher intake of unsaturated fat may reduce Triglycerides and Increase HDL    Adjunctive Measures (may lower LIPIDS and reduce risk of Heart Attack) include: Aerobic Exercise (20-30  minutes 3-4 times a week) Limit Alcohol Consumption Weight Reduction Aspirin 75-81 mg a day by mouth (if not allergic or contraindicated) Dietary Fiber 20-30 grams a day by mouth     Current Medications: 1)    Toprol Xl 100 Mg  Xr24h-tab (Metoprolol succinate) .Marland Kitchen.. 1 1/2 tabs by mouth daily 2)    Cozaar 100 Mg  Tabs (Losartan potassium) .Marland Kitchen.. 1 tab daily 3)    Plavix 75 Mg  Tabs (Clopidogrel bisulfate) .Marland Kitchen.. 1 tab daily 4)    Norvasc 5 Mg  Tabs (Amlodipine besylate) .Marland Kitchen.. 1 tab daily 5)    Lipitor 20 Mg  Tabs (Atorvastatin calcium) .Marland Kitchen.. 1 tab daily 6)    Hydrochlorothiazide 25 Mg  Tabs (Hydrochlorothiazide) .Marland Kitchen.. 1 tab daily 7)    Bayer Aspirin Ec Low Dose 81 Mg  Tbec (Aspirin) .Marland Kitchen.. 1 tab daily 8)    Advair Diskus 100-50 Mcg/dose  Misc (Fluticasone-salmeterol) .Marland Kitchen.. 1 inhalation two times a day 9)    Ventolin Hfa 108 (90 Base) Mcg/act Aers (Albuterol sulfate) .... 2 puffs every 6 hours as needed for wheezing 10)    Wellbutrin Sr 150 Mg Xr12h-tab (Bupropion hcl) .Marland Kitchen.. 1 tab by mouth q a.m. for 3 days, then 1 tab by mouth two times a day 11)    Famotidine 40 Mg Tabs (Famotidine) .Marland Kitchen.. 1 tab by mouth q hs 12)  Ferrous Sulfate 325 (65 Fe) Mg Tabs (Ferrous sulfate) .Marland Kitchen.. 1 tab by mouth daily 13)    Oyster Shell Calcium/d 500-200 Mg-unit Tabs (Calcium-vitamin d) .Marland Kitchen.. 1 tab by mouth two times a day  If you have any questions, please call. We appreciate being able to work with you.   Sincerely,    [facility] Mack Hook MD

## 2010-05-04 NOTE — Progress Notes (Signed)
Summary: Office Visit//DEPRESSION SCREENING  Office Visit//DEPRESSION SCREENING   Imported By: Roland Earl 08/08/2009 15:01:23  _____________________________________________________________________  External Attachment:    Type:   Image     Comment:   External Document

## 2010-05-04 NOTE — Letter (Signed)
Summary: test order for//ULTRASOUND//APPT DATE & TIME  test order for//ULTRASOUND//APPT DATE & TIME   Imported By: Roland Earl 07/29/2009 15:07:25  _____________________________________________________________________  External Attachment:    Type:   Image     Comment:   External Document

## 2010-05-04 NOTE — Progress Notes (Signed)
Summary: GI referral /genetic counselor  Phone Note Outgoing Call   Summary of Call: Nora--this is the woman who told me we have tried to get her into GI several times for colonoscopy, but they are asking $1800 up front and unwilling to do a payment plan.  Can you see about getting her set up and counsel her as to what she should do if she runs into this again?  Thanks.  Another request:  can you check to see if [facility], [facility], or [facility] has a Dietitian that can see the pt. at low cost to go over her risk for breast cancer--3 first degree relatives and more extended family member.  You might also check with [facility] as they have had a Geneticist in the past and might know best where to look for someone.   Initial call taken by: Mack Hook MD,  March 14, 2010 9:11 AM  Follow-up for Phone Call        GI REFERRAL I SAW THAT SHE WAS REFERRAL TO DR Deatra Ina [facility] .  I SEND THE REFERRAL TO [facility] AND THEY CHARGE THE % THAT IS IN THE ORANGE CARD AND IF SHE RECEIVED A RECEIPT SHE NEED TO CALL AND SAID THAT SHE IS A HEALTH SERVE PT AND IN DECEMBER WE ARE ON CALL .Marland KitchenMaren Reamer  March 15, 2010 5:44 PM * GENETIC COUNSELOR  IN  [place] BUT THE PERSON THAT I NEED TO TALK TO  SHE WILL BE BACK ON MODAY 12-19 HER NAME IS GAILE [place] # [id number] I LVM TO RETURN MY CALL   Follow-up by: Maren Reamer,  March 17, 2010 12:34 PM  Additional Follow-up for Phone Call Additional follow up Details #1::        I TALK TO MS Sunol ME TO FAXED THE INFORMATION @ [phone] AND SHE WILL CONTACT THE PT AND FOR THE COST SHE DONT KNOW  HOW MUCH IT WILL BE.Marland KitchenMaren Reamer  March 20, 2010 9:05 AM

## 2010-05-04 NOTE — Letter (Signed)
Summary: *Referral Letter  [facility]  [address] [place], [place] [address]   Phone: [phone]  Fax: [phone]    06/07/2009  Thank you in advance for agreeing to see my patient:  Kayla Mcclure [address] [place], [place]  [address]  Phone: [phone]  Reason for Referral: colonoscopy--screen.  Pt. has been unable to afford when set up in past.  Mother with history of colon cancer diagnosed in her early 17s  Procedures Requested:   Current Medical Problems: 1)  THYROID NODULE, RIGHT (ICD-241.0) 2)  DEGENERATIVE JOINT DISEASE, SPINE--L/S AND THORACIC (ICD-721.90) 3)  TOBACCO ABUSE (ICD-305.1) 4)  MUSCLE CRAMPS, FOOT (ICD-729.82) 5)  ANEMIA (ICD-285.9) 6)  BACK PAIN, RIGHT (ICD-724.5) 7)  CLAUDICATION (ICD-443.9) 8)  RENAL INSUFFICIENCY (ICD-588.9) 9)  SPONTANEOUS ECCHYMOSES (ICD-782.7) 10)  EPIGASTRIC PAIN (ICD-789.06) 11)  ADENOCARCINOMA, COLON, FAMILY HX (ICD-V16.0) 12)  DEPRESSION (ICD-311) 13)  ENCOUNTER FOR LONG-TERM USE OF OTHER MEDICATIONS (ICD-V58.69) 14)  HEALTH MAINTENANCE EXAM (ICD-V70.0) 15)  ROUTINE GYNECOLOGICAL EXAMINATION (ICD-V72.31) 16)  MI (ICD-410.90) 17)  CAD (ICD-414.00) 18)  ASTHMA (ICD-493.90) 19)  ESSENTIAL HYPERTENSION (ICD-401.9)   Current Medications: 1)  TOPROL XL 100 MG  XR24H-TAB (METOPROLOL SUCCINATE) 1 1/2 tabs by mouth daily 2)  COZAAR 100 MG  TABS (LOSARTAN POTASSIUM) 1 tab daily 3)  PLAVIX 75 MG  TABS (CLOPIDOGREL BISULFATE) 1 tab daily 4)  NORVASC 5 MG  TABS (AMLODIPINE BESYLATE) 1 tab daily 5)  LIPITOR 20 MG  TABS (ATORVASTATIN CALCIUM) 1 tab daily 6)  HYDROCHLOROTHIAZIDE 25 MG  TABS (HYDROCHLOROTHIAZIDE) 1 tab daily 7)  BAYER ASPIRIN EC LOW DOSE 81 MG  TBEC (ASPIRIN) 1 tab daily 8)  ADVAIR DISKUS 100-50 MCG/DOSE  MISC (FLUTICASONE-SALMETEROL) 1 inhalation two times a day 9)  VENTOLIN HFA 108 (90 BASE) MCG/ACT AERS (ALBUTEROL SULFATE) 2 puffs every 6 hours as needed for wheezing 10)  WELLBUTRIN SR 150  MG XR12H-TAB (BUPROPION HCL) 1 tab by mouth q a.m. for 3 days, then 1 tab by mouth two times a day 11)  FAMOTIDINE 40 MG TABS (FAMOTIDINE) 1 tab by mouth q hs 12)  FERROUS SULFATE 325 (65 FE) MG TABS (FERROUS SULFATE) 1 tab by mouth daily 13)  OYSTER SHELL CALCIUM/D 500-200 MG-UNIT TABS (CALCIUM-VITAMIN D) 1 tab by mouth two times a day   Past Medical History: 1)  DEGENERATIVE JOINT DISEASE, SPINE--L/S AND THORACIC (ICD-721.90) 2)  TOBACCO ABUSE (ICD-305.1) 3)  MUSCLE CRAMPS, FOOT (ICD-729.82) 4)  ANEMIA (ICD-285.9) 5)  BACK PAIN, RIGHT (ICD-724.5) 6)  CLAUDICATION (ICD-443.9) 7)  RENAL INSUFFICIENCY (ICD-588.9) 8)  SPONTANEOUS ECCHYMOSES (ICD-782.7) 9)  EPIGASTRIC PAIN (ICD-789.06) 10)  ADENOCARCINOMA, COLON, FAMILY HX (ICD-V16.0) 11)  DEPRESSION (ICD-311) 12)  ENCOUNTER FOR LONG-TERM USE OF OTHER MEDICATIONS (ICD-V58.69) 13)  HEALTH MAINTENANCE EXAM (ICD-V70.0) 14)  ROUTINE GYNECOLOGICAL EXAMINATION (ICD-V72.31) 15)  MI (ICD-410.90) 16)  CAD (ICD-414.00) 17)  ASTHMA (ICD-493.90) 18)  ESSENTIAL HYPERTENSION (ICD-401.9)   Prior History of Blood Transfusions:   Pertinent Labs:    Thank you again for agreeing to see our patient; please contact us if you have any further questions or need additional information.  Sincerely,  Mack Hook MD

## 2010-05-04 NOTE — Assessment & Plan Note (Signed)
Summary: 3 MONTH FU////KT   Vital Signs:  Patient profile:   65 year old female Menstrual status:  postmenopausal Height:      60 inches Weight:      168 pounds BMI:     32.93 Temp:     98.1 degrees F oral Pulse rate:   76 / minute Pulse rhythm:   regular Resp:     18 per minute BP sitting:   118 / 70  (left arm) Cuff size:   regular  Vitals Entered By: [place] Shannon (January 10, 2010 8:49 AM)  O2 Flow:  Room air CC: three month f/u... refill on inhaler.... pt does want flu shot... Is Patient Diabetic? No Pain Assessment Patient in pain? no       Does patient need assistance? Functional Status Self care Ambulation Normal   CC:  three month f/u... refill on inhaler.... pt does want flu shot....  History of Present Illness: 1.  Insomnia:  Trazadone working well for her.  Getting 5-6 hours at night and awakening feeling fairly well rested.   2.  CAD:  no chest pain, staying active watching great grandchildren.    3.  Hypertension:  no problems with meds.  Has been controlleds.  Physically active daily as above  4.  Asthma:  No dyspnea, though does note some nightly congestion with change in weather.  Almost feels like she has the flu sometimes with weather changes. Has not had flu vaccine.  5.  Indigestion:  Has for past 1-2 months noted sharp pains in mid thoracic  back--always finds herself holding her right epigastrium with this.  Having discomfort 3-4 times daily for 10-15 minutes on a good day.  Has had gallbladder removed.  This is nothing like that discomfort.  Is taking Pepcid regularly, but not on empty stomach--takes before lunch each day.  Eats a lot of tomatoes, not much onions.  Smoking 3 cigarettes a day.  Drinks very little soda.  Drinks 1 cup coffee daily.  Taking Ibuprofen 400 mg maybe 4 times monthly.  No alcohol intake.  Does have HOB elevated.  No melena or hematochezia  Current Medications (verified): 1)  Toprol Xl 100 Mg  Xr24h-Tab (Metoprolol  Succinate) .Marland Kitchen.. 1 1/2 Tabs By Mouth Daily 2)  Cozaar 100 Mg  Tabs (Losartan Potassium) .Marland Kitchen.. 1 Tab Daily 3)  Plavix 75 Mg  Tabs (Clopidogrel Bisulfate) .Marland Kitchen.. 1 Tab Daily 4)  Norvasc 5 Mg  Tabs (Amlodipine Besylate) .Marland Kitchen.. 1 Tab Daily 5)  Lipitor 20 Mg  Tabs (Atorvastatin Calcium) .Marland Kitchen.. 1 Tab Daily 6)  Hydrochlorothiazide 25 Mg  Tabs (Hydrochlorothiazide) .Marland Kitchen.. 1 Tab Daily 7)  Bayer Aspirin Ec Low Dose 81 Mg  Tbec (Aspirin) .Marland Kitchen.. 1 Tab Daily 8)  Advair Diskus 100-50 Mcg/dose  Misc (Fluticasone-Salmeterol) .Marland Kitchen.. 1 Inhalation Two Times A Day 9)  Ventolin Hfa 108 (90 Base) Mcg/act Aers (Albuterol Sulfate) .... 2 Puffs Every 6 Hours As Needed For Wheezing 10)  Wellbutrin Sr 150 Mg Xr12h-Tab (Bupropion Hcl) .Marland Kitchen.. 1 Tab By Mouth Q A.m. For 3 Days, Then 1 Tab By Mouth Two Times A Day 11)  Famotidine 40 Mg Tabs (Famotidine) .Marland Kitchen.. 1 Tab By Mouth Q Hs 12)  Ferrous Sulfate 325 (65 Fe) Mg Tabs (Ferrous Sulfate) .Marland Kitchen.. 1 Tab By Mouth Daily 13)  Oyster Shell Calcium/d 500-200 Mg-Unit Tabs (Calcium-Vitamin D) .Marland Kitchen.. 1 Tab By Mouth Two Times A Day 14)  Trazodone Hcl 50 Mg Tabs (Trazodone Hcl) .Marland Kitchen.. 1-2 Tabs By Mouth At Bedtime For  Sleep  Allergies (verified): No Known Drug Allergies  Physical Exam  General:  NAD Lungs:  Normal respiratory effort, chest expands symmetrically. Lungs are clear to auscultation, no crackles or wheezes. Heart:  Normal rate and regular rhythm. S1 and S2 normal without gallop, murmur, click, rub or other extra sounds.  Radial pulses normal and equal. Abdomen:  soft, normal bowel sounds, no masses, no guarding, no rigidity, no rebound tenderness, no hepatomegaly, and no splenomegaly.  Mild to moderate tenderness in epigastrium and right upper quadrant.  Less so in LUQ   Impression & Recommendations:  Problem # 1:  INSOMNIA, CHRONIC (ICD-307.42) Much improved on Trazodone.  Problem # 2:  EPIGASTRIC PAIN (ICD-789.06)  PUD vs GERD vs pancreatic source--the latter unlikely by history. On  Plavix--so will just increase H2 blocker.  Orders: T-Comprehensive Metabolic Panel ([id number]) T-CBC w/Diff [phone]) T-Amylase [phone]) T-Lipase [phone])  Problem # 3:  CAD (ICD-414.00) Stable Her updated medication list for this problem includes:    Toprol Xl 100 Mg Xr24h-tab (Metoprolol succinate) .Marland Kitchen... 1 1/2 tabs by mouth daily    Cozaar 100 Mg Tabs (Losartan potassium) .Marland Kitchen... 1 tab daily    Plavix 75 Mg Tabs (Clopidogrel bisulfate) .Marland Kitchen... 1 tab daily    Norvasc 5 Mg Tabs (Amlodipine besylate) .Marland Kitchen... 1 tab daily    Hydrochlorothiazide 25 Mg Tabs (Hydrochlorothiazide) .Marland Kitchen... 1 tab daily    Bayer Aspirin Ec Low Dose 81 Mg Tbec (Aspirin) .Marland Kitchen... 1 tab daily  Problem # 4:  ASTHMA (ICD-493.90) Stable. Encouraged smoking cessation. Her updated medication list for this problem includes:    Advair Diskus 100-50 Mcg/dose Misc (Fluticasone-salmeterol) .Marland Kitchen... 1 inhalation two times a day    Ventolin Hfa 108 (90 Base) Mcg/act Aers (Albuterol sulfate) .Marland Kitchen... 2 puffs every 6 hours as needed for wheezing  Complete Medication List: 1)  Toprol Xl 100 Mg Xr24h-tab (Metoprolol succinate) .Marland Kitchen.. 1 1/2 tabs by mouth daily 2)  Cozaar 100 Mg Tabs (Losartan potassium) .Marland Kitchen.. 1 tab daily 3)  Plavix 75 Mg Tabs (Clopidogrel bisulfate) .Marland Kitchen.. 1 tab daily 4)  Norvasc 5 Mg Tabs (Amlodipine besylate) .Marland Kitchen.. 1 tab daily 5)  Lipitor 20 Mg Tabs (Atorvastatin calcium) .Marland Kitchen.. 1 tab daily 6)  Hydrochlorothiazide 25 Mg Tabs (Hydrochlorothiazide) .Marland Kitchen.. 1 tab daily 7)  Bayer Aspirin Ec Low Dose 81 Mg Tbec (Aspirin) .Marland Kitchen.. 1 tab daily 8)  Advair Diskus 100-50 Mcg/dose Misc (Fluticasone-salmeterol) .Marland Kitchen.. 1 inhalation two times a day 9)  Ventolin Hfa 108 (90 Base) Mcg/act Aers (Albuterol sulfate) .... 2 puffs every 6 hours as needed for wheezing 10)  Wellbutrin Sr 150 Mg Xr12h-tab (Bupropion hcl) .Marland Kitchen.. 1 tab by mouth q a.m. for 3 days, then 1 tab by mouth two times a day 11)  Famotidine 40 Mg Tabs (Famotidine) .Marland Kitchen.. 1 tab by  mouth two times a day -take 1/2 hour before breakfast and dinner 12)  Ferrous Sulfate 325 (65 Fe) Mg Tabs (Ferrous sulfate) .Marland Kitchen.. 1 tab by mouth daily 13)  Oyster Shell Calcium/d 500-200 Mg-unit Tabs (Calcium-vitamin d) .Marland Kitchen.. 1 tab by mouth two times a day 14)  Trazodone Hcl 50 Mg Tabs (Trazodone hcl) .Marland Kitchen.. 1-2 tabs by mouth at bedtime for sleep  Other Orders: Influenza Vaccine NON MCR [id number])  Patient Instructions: 1)  Avoid smoking and tomatoes, caffeine 2)  Follow up with Dr. Amil Amen in 2 months --epigastric pain. 3)  CPP with Dr. Amil Amen in March 2012 Prescriptions: VENTOLIN HFA 108 (90 BASE) MCG/ACT AERS (ALBUTEROL SULFATE) 2 puffs every 6 hours as needed  for wheezing  #1 x 1   Entered and Authorized by:   Mack Hook MD   Signed by:   Mack Hook MD on 01/10/2010   Method used:   Faxed to ...       [facility] (retail)       [place], [place]  [address]       Ph: [phone] [place]       Fax: [phone]   RxID:   [id number] TRAZODONE HCL 50 MG TABS (TRAZODONE HCL) 1-2 tabs by mouth at bedtime for sleep  #30 x 11   Entered and Authorized by:   Mack Hook MD   Signed by:   Mack Hook MD on 01/10/2010   Method used:   Faxed to ...       [facility] (retail)       [place], [place]  [address]       Ph: [phone]       Fax: [phone]   RxID:   [id number] FAMOTIDINE 40 MG TABS (FAMOTIDINE) 1 tab by mouth two times a day -take 1/2 hour before breakfast and dinner  #60 x 11   Entered and Authorized by:   Mack Hook MD   Signed by:   Mack Hook MD on 01/10/2010   Method used:   Faxed to ...       [facility] (retail)       [place], [place]  [address]       Ph: [phone] [place]       Fax: [phone]   RxID:   [id number]    Tetanus/Td  Immunization History:    Tetanus/Td # 1:  Tdap (12/21/2008)  Influenza Immunization History:    Influenza # 1:  Fluvax Non-MCR (01/10/2010)  Pneumovax Immunization History:    Pneumovax # 1:  Historical (04/02/2005)  Influenza Vaccine    Vaccine Type: Fluvax Non-MCR    Site: right deltoid    Mfr: GlaxoSmithKline    Dose: 0.5 ml    Route: IM    Given by: Rhunette Croft    Exp. Date: 09/30/2010    Lot #: [id number]    VIS given: 10/25/09 version given January 10, 2010.  Flu Vaccine Consent Questions    Do you have a history of severe allergic reactions to this vaccine? no    Any prior history of allergic reactions to egg and/or gelatin? no    Do you have a sensitivity to the preservative Thimersol? no    Do you have a past history of Guillan-Barre Syndrome? no    Do you currently have an acute febrile illness? no    Have you ever had a severe reaction to latex? no    Vaccine information given and explained to patient? yes    Are you currently pregnant? no

## 2010-05-04 NOTE — Letter (Signed)
Summary: [facility] /CONSULTATION  [facility] /CONSULTATION   Imported By: Roland Earl 04/06/2010 15:19:17  _____________________________________________________________________  External Attachment:    Type:   Image     Comment:   External Document

## 2010-05-04 NOTE — Assessment & Plan Note (Signed)
Summary: F/U WITH DR Abid Bolla IN 4 MONTHS//GK   Vital Signs:  Patient profile:   65 year old female Menstrual status:  postmenopausal Weight:      166 pounds Temp:     98.2 degrees F Pulse rate:   76 / minute Pulse rhythm:   regular Resp:     20 per minute BP sitting:   158 / 83  (left arm) Cuff size:   regular  Vitals Entered By: Shellia Carwin CMA (October 07, 2009 9:31 AM) CC: 4 month f/u insomnia, still having trouble with it. Is Patient Diabetic? No Pain Assessment Patient in pain? no       Does patient need assistance? Ambulation Normal   CC:  4 month f/u insomnia and still having trouble with it.Marland Kitchen  History of Present Illness: 1.  INsomnia:  has had problems all of life.  Worse in past 2 weeks.  No history of shift work.  Avoids going to bed before midnight as she awakens around 12:30 and then is up until 4-5 a.m.  Generally in bed a bit after midnight.  Generally is doing housework before bed.  Watches TV prior to that.  If awakens in night, lies in bed and watches TV, works a puzzle--sometimes reads.  Works out with calisthenics for about 30 minutes daily.  Does get outside for 2-3 hours daily watching the kids play--sometimes plays with them.  Coffee every morning.  Not much in way of chocolate.  Allergies (verified): No Known Drug Allergies  Physical Exam  General:  NAD   Impression & Recommendations:  Problem # 1:  INSOMNIA, CHRONIC (ICD-307.42) Start low dose Trazodone--to increase to 100 mg in 1 week if no improvement Discussed at length changes to sleep hygiene  Complete Medication List: 1)  Toprol Xl 100 Mg Xr24h-tab (Metoprolol succinate) .Marland Kitchen.. 1 1/2 tabs by mouth daily 2)  Cozaar 100 Mg Tabs (Losartan potassium) .Marland Kitchen.. 1 tab daily 3)  Plavix 75 Mg Tabs (Clopidogrel bisulfate) .Marland Kitchen.. 1 tab daily 4)  Norvasc 5 Mg Tabs (Amlodipine besylate) .Marland Kitchen.. 1 tab daily 5)  Lipitor 20 Mg Tabs (Atorvastatin calcium) .Marland Kitchen.. 1 tab daily 6)  Hydrochlorothiazide 25 Mg Tabs  (Hydrochlorothiazide) .Marland Kitchen.. 1 tab daily 7)  Bayer Aspirin Ec Low Dose 81 Mg Tbec (Aspirin) .Marland Kitchen.. 1 tab daily 8)  Advair Diskus 100-50 Mcg/dose Misc (Fluticasone-salmeterol) .Marland Kitchen.. 1 inhalation two times a day 9)  Ventolin Hfa 108 (90 Base) Mcg/act Aers (Albuterol sulfate) .... 2 puffs every 6 hours as needed for wheezing 10)  Wellbutrin Sr 150 Mg Xr12h-tab (Bupropion hcl) .Marland Kitchen.. 1 tab by mouth q a.m. for 3 days, then 1 tab by mouth two times a day 11)  Famotidine 40 Mg Tabs (Famotidine) .Marland Kitchen.. 1 tab by mouth q hs 12)  Ferrous Sulfate 325 (65 Fe) Mg Tabs (Ferrous sulfate) .Marland Kitchen.. 1 tab by mouth daily 13)  Oyster Shell Calcium/d 500-200 Mg-unit Tabs (Calcium-vitamin d) .Marland Kitchen.. 1 tab by mouth two times a day 14)  Trazodone Hcl 50 Mg Tabs (Trazodone hcl) .Marland Kitchen.. 1-2 tabs by mouth at bedtime for sleep  Patient Instructions: 1)  Follow up with Dr. Amil Amen in 3 months 2)  Regular bedtime and waking time 3)  Get up and read if unable to fall asleep in 15-20 minutes. 4)  Limit caffeine 5)  Get TV out of bedroom. 6)  No busy work prior to bedtime 7)  No puzzles or TV when cannot sleep. 8)  Go somewhere quiet and comfortable to read--Not bathroom  Prescriptions: TRAZODONE HCL 50 MG TABS (TRAZODONE HCL) 1-2 tabs by mouth at bedtime for sleep  #60 x 3   Entered and Authorized by:   Mack Hook MD   Signed by:   Mack Hook MD on 10/07/2009   Method used:   Faxed to ...       [facility] (retail)       [address] [place], [place]  [address]       Ph: [phone]       Fax: [phone]   RxID:   [id number]

## 2010-05-04 NOTE — Assessment & Plan Note (Signed)
Summary: 4 MONTH FU FOR CPP///KT   Vital Signs:  Patient profile:   64 year old female Menstrual status:  postmenopausal Weight:      163 pounds BMI:     31.95 Temp:     98.1 degrees F Pulse rate:   72 / minute Pulse rhythm:   regular Resp:     18 per minute BP sitting:   141 / 81  (left arm) Cuff size:   regular  Vitals Entered By: Shellia Carwin CMA (June 07, 2009 9:40 AM)  History of Present Illness: 65 yo female here for CPP.  Concerns:None  Habits & Providers  Alcohol-Tobacco-Diet     Alcohol drinks/day: rare     Tobacco Status: current     Cigarette Packs/Day: 3 cigarettes daily     Year Started: age 20  Exercise-Depression-Behavior     Drug Use: never  Allergies (verified): No Known Drug Allergies  Past History:  Past Medical History: DEGENERATIVE JOINT DISEASE, SPINE--L/S AND THORACIC (ICD-721.90) TOBACCO ABUSE (ICD-305.1) MUSCLE CRAMPS, FOOT (ICD-729.82) ANEMIA (ICD-285.9) BACK PAIN, RIGHT (ICD-724.5) CLAUDICATION (ICD-443.9) RENAL INSUFFICIENCY (ICD-588.9) SPONTANEOUS ECCHYMOSES (ICD-782.7) EPIGASTRIC PAIN (ICD-789.06) ADENOCARCINOMA, COLON, FAMILY HX (ICD-V16.0) DEPRESSION (ICD-311) ENCOUNTER FOR LONG-TERM USE OF OTHER MEDICATIONS (ICD-V58.69) HEALTH MAINTENANCE EXAM (ICD-V70.0) ROUTINE GYNECOLOGICAL EXAMINATION (ICD-V72.31) MI (ICD-410.90) CAD (ICD-414.00) ASTHMA (ICD-493.90) ESSENTIAL HYPERTENSION (ICD-401.9)  Past Surgical History: Reviewed history from 12/30/2007 and no changes required. 1. 1975:   Cholecystectomy 2.  08/05/07:  RCA stented with acute MI -- Dr. Melvern Banker  Family History: Mother, 58:  DM, hypertension, CAD, colon cancer at age 68 Father, died 18:  pneumonia 59 Sisters:  1 with DM, hypertension, CAD.  Another sister with breast cancer at age 23.  Currently 3 5 Brothers: 1 Brother died from unknown cancer, age 52--last week. 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 Eyes:  wears glasses.  Goes to Dr. Ricki Miller.. ENT:  Denies decreased hearing. CV:  Denies chest pain or discomfort, palpitations, and shortness of breath with exertion. Resp:  Denies cough and shortness of breath. GI:  Complains of constipation; denies abdominal pain, bloody stools, and diarrhea; Black stools--but takes iron.. GU:  Denies discharge, dysuria, and urinary frequency. MS:  Denies joint pain, joint redness, and joint swelling. Derm:  Denies lesion(s) and rash. Neuro:  Denies numbness, tingling, and weakness. Psych:  Denies anxiety, depression, and suicidal thoughts/plans.  Physical Exam  General:  Well-developed,well-nourished,in no acute distress; alert,appropriate and cooperative throughout examination Head:  Normocephalic and atraumatic without obvious abnormalities. No apparent alopecia or balding. Eyes:  No corneal or conjunctival inflammation noted. EOMI. Perrla. Funduscopic exam benign, without hemorrhages, exudates or papilledema. Vision grossly normal. Ears:  External ear exam shows no significant lesions or deformities.  Otoscopic examination reveals clear canals, tympanic membranes are intact bilaterally without bulging, retraction, inflammation or discharge. Hearing is grossly normal bilaterally. Nose:  External nasal examination shows no deformity or inflammation. Nasal mucosa are pink and moist without lesions or exudates. Mouth:  Oral mucosa and oropharynx without lesions or exudates.  upper bridge Neck:  No deformities,  or tenderness noted.  Right thyroid nodule Breasts:  No mass, nodules, thickening, , bulging, retraction, inflamation, nipple discharge or skin changes noted.    Mild diffuse tenderness of breasts Lungs:  Normal respiratory effort,  chest expands symmetrically. Lungs are clear to auscultation, no crackles or  wheezes. Heart:  Normal rate and regular rhythm. S1 and S2 normal without gallop, murmur, click, rub or other extra sounds. Abdomen:  Bowel sounds positive,abdomen soft and non-tender without masses, organomegaly or hernias noted. Scattered surgical scars Rectal:  No external abnormalities noted. Normal sphincter tone. No rectal masses or tenderness.  Heme negative dark brown-black stool Genitalia:  Pelvic Exam:        External: normal female genitalia without lesions or masses        Vagina: normal without lesions or masses Atrophic mucosa--a bit friable        Cervix: normal without lesions or masses        Adnexa: normal bimanual exam without masses or fullness        Uterus: normal by palpation        Pap smear: performed Msk:  No deformity or scoliosis noted of thoracic or lumbar spine.   Pulses:  R and L carotid,radial,femoral,dorsalis pedis and posterior tibial pulses are full and equal bilaterally Extremities:  No clubbing, cyanosis, edema, or deformity noted with normal full range of motion of all joints.   Neurologic:  No cranial nerve deficits noted. Station and gait are normal. Plantar reflexes are down-going bilaterally. DTRs are symmetrical throughout. Sensory, motor and coordinative functions appear intact. Skin:  Intact without suspicious lesions or rashes Cervical Nodes:  No lymphadenopathy noted Axillary Nodes:  No palpable lymphadenopathy Inguinal Nodes:  No significant adenopathy Psych:  Cognition and judgment appear intact. Alert and cooperative with normal attention span and concentration. No apparent delusions, illusions, hallucinations   Impression & Recommendations:  Problem # 1:  ROUTINE GYNECOLOGICAL EXAMINATION (ICD-V72.31) Mammogram scheduled Orders: [place] [id number]) UA Dipstick w/o Micro (manual) ([id number]) Pap Smear, Thin Prep ( Collection of) [id number]) T-HIV Antibody  (Reflex) [id number]) T- GC Chlamydia [id number]) T-Syphilis Test (RPR)  [id number])  Problem # 2:  HEALTH MAINTENANCE EXAM (ICD-V70.0)  Hemoccult cards x3--to return in 2 weeks. PHQ-9 filled out--scored a 5--this after visit--main concern was just poor sleep--will address at next visit. Needs colonoscopy with mother's history. Pt. will see if can better afford  Orders: Gastroenterology Referral (GI)  Problem # 3:  THYROID NODULE, RIGHT (ICD-241.0)  Orders: T-TSH [id number]) Ultrasound (Ultrasound)  Complete Medication List: 1)  Toprol Xl 100 Mg Xr24h-tab (Metoprolol succinate) .Marland Kitchen.. 1 1/2 tabs by mouth daily 2)  Cozaar 100 Mg Tabs (Losartan potassium) .Marland Kitchen.. 1 tab daily 3)  Plavix 75 Mg Tabs (Clopidogrel bisulfate) .Marland Kitchen.. 1 tab daily 4)  Norvasc 5 Mg Tabs (Amlodipine besylate) .Marland Kitchen.. 1 tab daily 5)  Lipitor 20 Mg Tabs (Atorvastatin calcium) .Marland Kitchen.. 1 tab daily 6)  Hydrochlorothiazide 25 Mg Tabs (Hydrochlorothiazide) .Marland Kitchen.. 1 tab daily 7)  Bayer Aspirin Ec Low Dose 81 Mg Tbec (Aspirin) .Marland Kitchen.. 1 tab daily 8)  Advair Diskus 100-50 Mcg/dose Misc (Fluticasone-salmeterol) .Marland Kitchen.. 1 inhalation two times a day 9)  Ventolin Hfa 108 (90 Base) Mcg/act Aers (Albuterol sulfate) .... 2 puffs every 6 hours as needed for wheezing 10)  Wellbutrin Sr 150 Mg Xr12h-tab (Bupropion hcl) .Marland Kitchen.. 1 tab by mouth q a.m. for 3 days, then 1 tab by mouth two times a day 11)  Famotidine 40 Mg Tabs (Famotidine) .Marland Kitchen.. 1 tab by mouth q hs 12)  Ferrous Sulfate 325 (65 Fe) Mg Tabs (Ferrous sulfate) .Marland Kitchen.. 1 tab by mouth daily 13)  Oyster Shell Calcium/d 500-200 Mg-unit Tabs (Calcium-vitamin d) .Marland Kitchen.. 1 tab by mouth two times  a day  Other Orders: T-Comprehensive Metabolic Panel ([id number]) T-CBC w/Diff [id number]) T-Lipid Profile [id number])  Patient Instructions: 1)  Follow up with Dr. Amil Amen in 4 months --discuss insomnia   Preventive Care Screening  Prior Values:    Pap Smear:  NEGATIVE FOR INTRAEPITHELIAL LESIONS OR MALIGNANCY. (12/30/2007)    Mammogram:  Normal (01/07/2008)    Last  Tetanus Booster:  Tdap (12/21/2008)    Last Flu Shot:  Fluvax 3+ (02/04/2009)    Last Pneumovax:  Historical (04/02/2005)     SBE:  Once monthly.  Last month, her breasts were very sore for about 1 week. Guaiac Cards:  Cannot recall when last done. Colonoscopy:  Has not done as has not had money to pay. Osteoprevention:  No dairy.  Some calisthenics at home with grandchildren.  Has never had a bone density.  Takes calcium with Vitamin D two times a day   Laboratory Results   Urine Tests    Routine Urinalysis   Glucose: negative   (Normal Range: Negative) Bilirubin: negative   (Normal Range: Negative) Ketone: negative   (Normal Range: Negative) Spec. Gravity: 1.025   (Normal Range: 1.003-1.035) Blood: negative   (Normal Range: Negative) pH: 5.0   (Normal Range: 5.0-8.0) Protein: negative   (Normal Range: Negative) Urobilinogen: 0.2   (Normal Range: 0-1) Nitrite: negative   (Normal Range: Negative) Leukocyte Esterace: negative   (Normal Range: Negative)       Appended Document: 4 MONTH FU FOR CPP///KT  Laboratory Results    Other Tests  Rapid HIV: negative    Appended Document: Hemmoccult cards    Clinical Lists Changes  Observations: Added new observation of HEMOCCULT 1: negative (06/21/2009 12:59) Added new observation of HEMOCCULT 3: negative (06/21/2009 12:59) Added new observation of HEMOCCULT 2: negative (06/21/2009 12:59)      Laboratory Results    Stool - Occult Blood Hemmoccult #1: negative Date: 06/21/2009 Hemoccult #2: negative Date: 06/21/2009 Hemoccult #3: negative Date: 06/21/2009  Kit Test Internal QC: Negative   (Normal Range: Negative)

## 2010-05-29 ENCOUNTER — Other Ambulatory Visit (HOSPITAL_COMMUNITY): Payer: Self-pay | Admitting: Internal Medicine

## 2010-05-29 DIAGNOSIS — Z1231 Encounter for screening mammogram for malignant neoplasm of breast: Secondary | ICD-10-CM

## 2010-06-13 ENCOUNTER — Encounter: Payer: Self-pay | Admitting: Internal Medicine

## 2010-06-13 ENCOUNTER — Other Ambulatory Visit: Payer: Self-pay | Admitting: Internal Medicine

## 2010-06-13 ENCOUNTER — Encounter (INDEPENDENT_AMBULATORY_CARE_PROVIDER_SITE_OTHER): Payer: Self-pay | Admitting: Internal Medicine

## 2010-06-13 LAB — CONVERTED CEMR LAB
Albumin: 4.3 g/dL (ref 3.5–5.2)
BUN: 27 mg/dL — ABNORMAL HIGH (ref 6–23)
Calcium: 9.4 mg/dL (ref 8.4–10.5)
Eosinophils Absolute: 0.2 10*3/uL (ref 0.0–0.7)
GC Probe Amp, Genital: NEGATIVE
HDL: 47 mg/dL (ref >39)
Hemoglobin: 12.7 g/dL (ref 12.0–15.0)
KOH Prep: NEGATIVE
MCHC: 31.6 g/dL (ref 30.0–36.0)
MCV: 84.5 fL (ref 78.0–100.0)
Monocytes Absolute: 0.7 10*3/uL (ref 0.1–1.0)
Platelets: 218 10*3/uL (ref 150–400)
Sodium: 138 meq/L (ref 135–145)
Total Protein: 7.8 g/dL (ref 6.0–8.3)
Triglycerides: 98 mg/dL (ref <150)
VLDL: 20 mg/dL (ref 0–40)

## 2010-06-16 ENCOUNTER — Ambulatory Visit (HOSPITAL_COMMUNITY)
Admission: RE | Admit: 2010-06-16 | Discharge: 2010-06-16 | Disposition: A | Discharge status: Home / Self Care | Payer: Self-pay | Source: Ambulatory Visit | Attending: Internal Medicine | Admitting: Internal Medicine

## 2010-06-16 DIAGNOSIS — Z1231 Encounter for screening mammogram for malignant neoplasm of breast: Secondary | ICD-10-CM | POA: Insufficient documentation

## 2010-06-20 NOTE — Progress Notes (Signed)
Summary: Office Visit//DEPRESSION SCREENING  Office Visit//DEPRESSION SCREENING   Imported By: Roland Earl 06/13/2010 15:22:23  _____________________________________________________________________  External Attachment:    Type:   Image     Comment:   External Document

## 2010-06-20 NOTE — Assessment & Plan Note (Signed)
Summary: 65 y/o CPP   Vital Signs:  Patient profile:   65 year old female Menstrual status:  postmenopausal Weight:      164.13 pounds O2 Sat:      96 % on Room air Temp:     98.6 degrees F oral Pulse rate:   82 / minute Pulse rhythm:   regular Resp:     18 per minute BP sitting:   122 / 74  (left arm) Cuff size:   regular  Vitals Entered By: Aquilla Solian CMA (June 13, 2010 9:18 AM)  O2 Flow:  Room air  Serial Vital Signs/Assessments:                                PEF    PreRx  PostRx Time      O2 Sat  O2 Type     L/min  L/min  L/min   By 9:18 AM   96  %   Room air                          Aquilla Solian CMA  Comments: 9:18 AM Peak Flow: 110 - 160 - 180 By: Aquilla Solian CMA   CC: 65 y/o CPP Is Patient Diabetic? No Pain Assessment Patient in pain? no       Does patient need assistance? Functional Status Self care Ambulation Normal  Vision Screening:Left eye w/o correction: 20 / 20-1 Right Eye w/o correction: 20 / 25-1 Both eyes w/o correction:  20/ 20        Vision Entered By: Aquilla Solian CMA (June 13, 2010 9:23 AM)   CC:  65 y/o CPP.  History of Present Illness: 65 yo female here for CPP.  Concerns:  1.  Tobacco Abuse:  Has slipped back to using.  Would like to try Chantix.    2.  Head cold starting yesterday.  Right nose stuffed up.  Sneezing.  Eyes watery as well.  No fever.  Not sure if allergies.  3.  Significant family hx of breast cancer:  pt. did speak with Genetic counseling at [facility] and they are working on getting pts younger sister, who has had breast cancer, in for testing.    Current Medications (verified): 1)  Toprol Xl 100 Mg  Xr24h-Tab (Metoprolol Succinate) .Marland Kitchen.. 1 1/2 Tabs By Mouth Daily 2)  Cozaar 100 Mg  Tabs (Losartan Potassium) .Marland Kitchen.. 1 Tab Daily 3)  Plavix 75 Mg  Tabs (Clopidogrel Bisulfate) .Marland Kitchen.. 1 Tab Daily 4)  Norvasc 5 Mg  Tabs (Amlodipine Besylate) .Marland Kitchen.. 1 Tab Daily 5)  Lipitor 20 Mg  Tabs (Atorvastatin Calcium) .Marland Kitchen.. 1 Tab  Daily 6)  Hydrochlorothiazide 25 Mg  Tabs (Hydrochlorothiazide) .Marland Kitchen.. 1 Tab Daily 7)  Bayer Aspirin Ec Low Dose 81 Mg  Tbec (Aspirin) .Marland Kitchen.. 1 Tab Daily 8)  Advair Diskus 100-50 Mcg/dose  Misc (Fluticasone-Salmeterol) .Marland Kitchen.. 1 Inhalation Two Times A Day 9)  Ventolin Hfa 108 (90 Base) Mcg/act Aers (Albuterol Sulfate) .... 2 Puffs Every 6 Hours As Needed For Wheezing 10)  Wellbutrin Sr 150 Mg Xr12h-Tab (Bupropion Hcl) .Marland Kitchen.. 1 Tab By Mouth Q A.m. For 3 Days, Then 1 Tab By Mouth Two Times A Day 11)  Famotidine 40 Mg Tabs (Famotidine) .Marland Kitchen.. 1 Tab By Mouth Two Times A Day -Take 1/2 Hour Before Breakfast and Dinner 12)  Ferrous Sulfate 325 (65 Fe) Mg Tabs (Ferrous Sulfate) .Marland KitchenMarland KitchenMarland Kitchen  1 Tab By Mouth Daily 13)  Oyster Shell Calcium/d 500-200 Mg-Unit Tabs (Calcium-Vitamin D) .Marland Kitchen.. 1 Tab By Mouth Two Times A Day 14)  Trazodone Hcl 50 Mg Tabs (Trazodone Hcl) .Marland Kitchen.. 1-2 Tabs By Mouth At Bedtime For Sleep 15)  Cetirizine Hcl 10 Mg Tabs (Cetirizine Hcl) .Marland Kitchen.. 1 Tab By Mouth Daily  Allergies (verified): No Known Drug Allergies  Past History:  Past Medical History: DIVERTICULOSIS OF COLON (ICD-562.10) INTERNAL HEMORRHOIDS (ICD-455.0) NEOPLASM, MALIGNANT, BREAST, FAMILY HX, MOTHER AND 2 SISTERS (ICD-V16.3) INSOMNIA, CHRONIC (ICD-307.42) THYROID NODULE, RIGHT (ICD-241.0) DEGENERATIVE JOINT DISEASE, SPINE--L/S AND THORACIC (ICD-721.90) TOBACCO ABUSE (ICD-305.1) MUSCLE CRAMPS, FOOT (ICD-729.82) ANEMIA (ICD-285.9) BACK PAIN, RIGHT (ICD-724.5) CLAUDICATION (ICD-443.9) RENAL INSUFFICIENCY (ICD-588.9) SPONTANEOUS ECCHYMOSES (ICD-782.7) EPIGASTRIC PAIN (ICD-789.06) ADENOCARCINOMA, COLON, FAMILY HX (ICD-V16.0) DEPRESSION (ICD-311) ENCOUNTER FOR LONG-TERM USE OF OTHER MEDICATIONS (ICD-V58.69) HEALTH MAINTENANCE EXAM (ICD-V70.0) ROUTINE GYNECOLOGICAL EXAMINATION (ICD-V72.31) MI (ICD-410.90) CAD (ICD-414.00) ASTHMA (ICD-493.90) ESSENTIAL HYPERTENSION (ICD-401.9)  Past Surgical History: Reviewed history from 12/30/2007  and no changes required. 1. 1975:   Cholecystectomy 2.  08/05/07:  RCA stented with acute MI -- Dr. Melvern Banker  Family History: Mother, 93:  DM, hypertension, CAD, colon cancer at age 35.  Now diagnosed with bilateral breast cancer at 7.   Father, died 36:  pneumonia 7 Sisters:  1 with DM, hypertension, CAD.  Another sister with breast cancer at age 90.  Currently 68.  Younger sister age 2 with hx of breast cancer in mid 27s. 15 Brothers: 23 Brother died from unknown cancer, age 65--last week.  Another brother, Leona Carry with history of aneurysm.   Son, 12:  Healthy Breast cancer in maternal great aunt, age 32 +;  Maternal 1st cousin with breast cancer in 33s (postmenopausal), Sister, in her 90s and another sister at age 86 also with breast cancer.  Sisters and mother still living.  More distant family members died from breast cancer.  No family history of ovarian cancer per pt. Has spoken with genetic counselor at [facility] of her sisters with the actual diagnosis of breast cancer (the counselor recommended the youngest sister)  needs to go so that the testing of rest of family is not so expensive.    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  Currently smoking 5 cigarettes daily Alcohol:  Rare Drugs:  no history  Review of Systems General:  Energy is good.. Eyes:  Denies blurring. ENT:  Denies decreased hearing. CV:  Denies chest pain or discomfort; Occasional dyspnea with physical activity. Resp:  See CV. GI:  Complains of constipation; denies abdominal pain, bloody stools, and dark tarry stools; Occasional constipation. Uses occasional suppository.   Has Miralax, but not using regularly.. GU:  Denies discharge, dysuria, and urinary frequency. MS:  Denies joint pain, joint redness, and joint swelling. Derm:  Denies lesion(s) and  rash. Neuro:  Denies numbness, tingling, and weakness. Psych:  Denies depression and suicidal thoughts/plans; PHQ9 scored 0.  Physical Exam  General:  Well-developed,well-nourished,in no acute distress; alert,appropriate and cooperative throughout examination Head:  Normocephalic and atraumatic without obvious abnormalities. No apparent alopecia or balding. Eyes:  No corneal or conjunctival inflammation noted. EOMI. Perrla. Funduscopic exam benign, without hemorrhages, exudates or papilledema. Vision grossly normal. Ears:  External ear exam shows no significant lesions or deformities.  Otoscopic examination reveals clear canals, tympanic membranes are intact bilaterally without bulging,  retraction, inflammation or discharge. Hearing is grossly normal bilaterally. Nose:  mildly swollen mucosa with clear discharge Mouth:  Oral mucosa and oropharynx without lesions or exudates.  dentures above. Neck:  No deformities, masses, or tenderness noted. Breasts:  No mass, nodules, thickening, tenderness, bulging, retraction, inflamation, nipple discharge or skin changes noted.   Lungs:  Normal respiratory effort, chest expands symmetrically. Lungs are clear to auscultation, no crackles or wheezes. Heart:  Normal rate and regular rhythm. S1 and S2 normal without gallop, murmur, click, rub or other extra sounds. Abdomen:  Bowel sounds positive,abdomen soft and non-tender without masses, organomegaly or hernias noted. Rectal:  Deferred. Genitalia:  Pelvic Exam:        External: normal female genitalia without lesions or masses        Vagina: normal without lesions or masses.  Some atrophy        Cervix: normal without lesions or masses        Adnexa: normal bimanual exam without masses or fullness        Uterus: normal by palpation        Pap smear: performed Msk:  No deformity or scoliosis noted of thoracic or lumbar spine.   Pulses:  R and L carotid,radial,femoral,dorsalis pedis and posterior tibial  pulses are full and equal bilaterally Extremities:  No clubbing, cyanosis, edema, or deformity noted with normal full range of motion of all joints.   Neurologic:  No cranial nerve deficits noted. Station and gait are normal. Plantar reflexes are down-going bilaterally. DTRs are symmetrical throughout. Sensory, motor and coordinative functions appear intact. Skin:  Intact without suspicious lesions or rashes Cervical Nodes:  No lymphadenopathy noted Axillary Nodes:  No palpable lymphadenopathy Inguinal Nodes:  No significant adenopathy Psych:  Cognition and judgment appear intact. Alert and cooperative with normal attention span and concentration. No apparent delusions, illusions, hallucinations   Impression & Recommendations:  Problem # 1:  ROUTINE GYNECOLOGICAL EXAMINATION (ICD-V72.31) Mammogram later in week. Orders: [place] [phone]) Pap Smear, Thin Prep ( Collection of) [id number]) UA Dipstick w/o Micro (automated)  (81003)  Problem # 2:  HEALTH MAINTENANCE EXAM (ICD-V70.0) Immunizations up to date. GI prevention up to date--no guaiac cards as recent colonoscopy without polyps  Problem # 3:  CAD (ICD-414.00) Stable Her updated medication list for this problem includes:    Toprol Xl 100 Mg Xr24h-tab (Metoprolol succinate) .Marland Kitchen... 1 1/2 tabs by mouth daily    Cozaar 100 Mg Tabs (Losartan potassium) .Marland Kitchen... 1 tab daily    Plavix 75 Mg Tabs (Clopidogrel bisulfate) .Marland Kitchen... 1 tab daily    Norvasc 5 Mg Tabs (Amlodipine besylate) .Marland Kitchen... 1 tab daily    Hydrochlorothiazide 25 Mg Tabs (Hydrochlorothiazide) .Marland Kitchen... 1 tab daily    Bayer Aspirin Ec Low Dose 81 Mg Tbec (Aspirin) .Marland Kitchen... 1 tab daily  Orders: T-Lipid Profile [id number])  Problem # 4:  ESSENTIAL HYPERTENSION (ICD-401.9) controlled Her updated medication list for this problem includes:    Toprol Xl 100 Mg Xr24h-tab (Metoprolol succinate) .Marland Kitchen... 1 1/2 tabs by mouth daily    Cozaar 100 Mg Tabs (Losartan potassium) .Marland Kitchen... 1 tab daily     Norvasc 5 Mg Tabs (Amlodipine besylate) .Marland Kitchen... 1 tab daily    Hydrochlorothiazide 25 Mg Tabs (Hydrochlorothiazide) .Marland Kitchen... 1 tab daily  Problem # 5:  TOBACCO ABUSE (ICD-305.1) Start Chantis--discussed to watch for depression, other psych changes and stop/call if noted. Her updated medication list for this problem includes:    Chantix 0.5 Mg Tabs (Varenicline tartrate) .Marland KitchenMarland KitchenMarland KitchenMarland Kitchen  1 tab by mouth daily for 3 days, then increase to 1 tab by mouth two times a day until day #8. day #8, start 1 mg by mouth two times a day    Chantix 1 Mg Tabs (Varenicline tartrate) .Marland Kitchen... 1 tab by mouth two times a day starting on day #8 of treatment--continue this dose  Complete Medication List: 1)  Toprol Xl 100 Mg Xr24h-tab (Metoprolol succinate) .Marland Kitchen.. 1 1/2 tabs by mouth daily 2)  Cozaar 100 Mg Tabs (Losartan potassium) .Marland Kitchen.. 1 tab daily 3)  Plavix 75 Mg Tabs (Clopidogrel bisulfate) .Marland Kitchen.. 1 tab daily 4)  Norvasc 5 Mg Tabs (Amlodipine besylate) .Marland Kitchen.. 1 tab daily 5)  Lipitor 20 Mg Tabs (Atorvastatin calcium) .Marland Kitchen.. 1 tab daily 6)  Hydrochlorothiazide 25 Mg Tabs (Hydrochlorothiazide) .Marland Kitchen.. 1 tab daily 7)  Bayer Aspirin Ec Low Dose 81 Mg Tbec (Aspirin) .Marland Kitchen.. 1 tab daily 8)  Advair Diskus 100-50 Mcg/dose Misc (Fluticasone-salmeterol) .Marland Kitchen.. 1 inhalation two times a day 9)  Ventolin Hfa 108 (90 Base) Mcg/act Aers (Albuterol sulfate) .... 2 puffs every 6 hours as needed for wheezing 10)  Famotidine 40 Mg Tabs (Famotidine) .Marland Kitchen.. 1 tab by mouth two times a day -take 1/2 hour before breakfast and dinner 11)  Ferrous Sulfate 325 (65 Fe) Mg Tabs (Ferrous sulfate) .Marland Kitchen.. 1 tab by mouth daily 12)  Oyster Shell Calcium/d 500-200 Mg-unit Tabs (Calcium-vitamin d) .Marland Kitchen.. 1 tab by mouth two times a day 13)  Trazodone Hcl 50 Mg Tabs (Trazodone hcl) .Marland Kitchen.. 1-2 tabs by mouth at bedtime for sleep 14)  Loratadine 10 Mg Tabs (Loratadine) .Marland Kitchen.. 1 tab by mouth daily 15)  Chantix 0.5 Mg Tabs (Varenicline tartrate) .Marland Kitchen.. 1 tab by mouth daily for 3 days, then increase  to 1 tab by mouth two times a day until day #8. day #8, start 1 mg by mouth two times a day 16)  Chantix 1 Mg Tabs (Varenicline tartrate) .Marland Kitchen.. 1 tab by mouth two times a day starting on day #8 of treatment--continue this dose  Other Orders: Peak Flow Rate (94150) Pulse Oximetry (single measurment) (94760) T- GC Chlamydia [id number]) T-Pap Smear, Thin Prep [id number]) T-Syphilis Test (RPR) [id number]) T-HIV Antibody  (Reflex) [id number]) T-CBC w/Diff LP:9351732) T-Comprehensive Metabolic Panel (A999333)  Patient Instructions: 1)  Follow up with Dr. Amil Amen in 3 months --smoking cessation Prescriptions: TRAZODONE HCL 50 MG TABS (TRAZODONE HCL) 1-2 tabs by mouth at bedtime for sleep  #30 x 11   Entered and Authorized by:   Mack Hook MD   Signed by:   Mack Hook MD on 06/13/2010   Method used:   Faxed to ...       [facility] (retail)       [place], [place]  [address]       Ph: [phone] [place]       Fax: [phone]   RxID:   [id number] OYSTER SHELL CALCIUM/D 500-200 MG-UNIT TABS (CALCIUM-VITAMIN D) 1 tab by mouth two times a day  #60 x 11   Entered and Authorized by:   Mack Hook MD   Signed by:   Mack Hook MD on 06/13/2010   Method used:   Faxed to ...       [facility] (retail)       [place], [place]  [address]       Ph: [phone]  Fax: [phone]   [id number] FERROUS SULFATE 325 (65 FE) MG TABS (FERROUS SULFATE) 1 tab by mouth daily  #30 x 6   Entered and Authorized by:   Mack Hook MD   Signed by:   Mack Hook MD on 06/13/2010   Method used:   Faxed to ...       [facility] (retail)       [place], [place]  [address]       Ph: [phone]       Fax: [phone]   RxID:   [id number] VENTOLIN HFA 108 (90 BASE)  MCG/ACT AERS (ALBUTEROL SULFATE) 2 puffs every 6 hours as needed for wheezing  #1 x 3   Entered and Authorized by:   Mack Hook MD   Signed by:   Mack Hook MD on 06/13/2010   Method used:   Faxed to ...       [facility] (retail)       [place], [place]  [address]       Ph: [phone]       Fax: [phone]   RxID:   [id number] FAMOTIDINE 40 MG TABS (FAMOTIDINE) 1 tab by mouth two times a day -take 1/2 hour before breakfast and dinner  #60 x 11   Entered and Authorized by:   Mack Hook MD   Signed by:   Mack Hook MD on 06/13/2010   Method used:   Faxed to ...       [facility] (retail)       [place], [place]  [address]       Ph: [phone] [place]       Fax: [phone]   RxID:   [id number] ADVAIR DISKUS 100-50 MCG/DOSE  MISC (FLUTICASONE-SALMETEROL) 1 inhalation two times a day  #1 x 11   Entered and Authorized by:   Mack Hook MD   Signed by:   Mack Hook MD on 06/13/2010   Method used:   Faxed to ...       [facility] (retail)       [place], [place]  [address]       Ph: [phone] [place]       Fax: [phone]   RxID:   [id number] HYDROCHLOROTHIAZIDE 25 MG  TABS (HYDROCHLOROTHIAZIDE) 1 tab daily  #30 x 11   Entered and Authorized by:   Mack Hook MD   Signed by:   Mack Hook MD on 06/13/2010   Method used:   Faxed to ...       [facility] (retail)       [place], [place]  [address]       Ph: [phone] [place]       Fax: [phone]   RxID:   [id number] LIPITOR 20 MG  TABS (ATORVASTATIN CALCIUM) 1 tab daily  #30 x 11   Entered and Authorized by:   Mack Hook MD   Signed by:   Mack Hook MD on 06/13/2010   Method used:   Faxed to ...        [facility] (retail)       [address]  [place], [place]  [address]       Ph: [phone] [place]       Fax: [phone]   RxID:   [id number] NORVASC 5 MG  TABS (AMLODIPINE BESYLATE) 1 tab daily  #30 x 11   Entered and Authorized by:   Mack Hook MD   Signed by:   Mack Hook MD on 06/13/2010   Method used:   Faxed to ...       [facility] (retail)       [place], [place]  [address]       Ph: [phone] [place]       Fax: [phone]   RxID:   [id number] PLAVIX 75 MG  TABS (CLOPIDOGREL BISULFATE) 1 tab daily  #30 x 11   Entered and Authorized by:   Mack Hook MD   Signed by:   Mack Hook MD on 06/13/2010   Method used:   Faxed to ...       [facility] (retail)       [place], [place]  [address]       Ph: [phone] [place]       Fax: [phone]   RxID:   [id number] COZAAR 100 MG  TABS (LOSARTAN POTASSIUM) 1 tab daily  #30 x 11   Entered and Authorized by:   Mack Hook MD   Signed by:   Mack Hook MD on 06/13/2010   Method used:   Faxed to ...       [facility] (retail)       [place], [place]  [address]       Ph: [phone] [place]       Fax: [phone]   RxID:   [id number] TOPROL XL 100 MG  XR24H-TAB (METOPROLOL SUCCINATE) 1 1/2 tabs by mouth daily  #45 x 11   Entered and Authorized by:   Mack Hook MD   Signed by:   Mack Hook MD on 06/13/2010   Method used:   Faxed to ...       [facility] (retail)       [place], [place]  [address]       Ph: [phone] [place]       Fax: [phone]   RxID:   [id number] LORATADINE 10 MG TABS (LORATADINE) 1 tab by mouth daily  #30 x 11   Entered and Authorized by:   Mack Hook MD    Signed by:   Mack Hook MD on 06/13/2010   Method used:   Faxed to ...       [facility] (retail)       [place],   [address]       Ph: [phone]       Fax: [phone]   RxID:   [id number] CHANTIX 1 MG TABS (VARENICLINE TARTRATE) 1 tab by mouth two times a day starting on Day #8 of treatment--continue this dose  #60 x 2   Entered and Authorized by:   Mack Hook MD   Signed by:   Mack Hook MD on 06/13/2010   Method used:   Faxed to .Marland KitchenMarland Kitchen  [facility] (retail)       [place], [place]  [address]       Ph: [phone]       Fax: [phone]   RxID:   [id number] CHANTIX 0.5 MG TABS (VARENICLINE TARTRATE) 1 tab by mouth daily for 3 days, then increase to 1 tab by mouth two times a day until day #8. Day #8, start 1 mg by mouth two times a day  #11 x 0   Entered and Authorized by:   Mack Hook MD   Signed by:   Mack Hook MD on 06/13/2010   Method used:   Faxed to ...       [facility] (retail)       [place], [place]  [address]       Ph: [phone] [place]       Fax: [phone]   RxID:   [id number]    Orders Added: 1)  Peak Flow Rate [94150] 2)  Pulse Oximetry (single measurment) [94760] 3)  Est. Patient age 80-64 [78] 4)  [facility] [phone] 5)  Pap Smear, Thin Prep ( Collection of) [id number] 6)  UA Dipstick w/o Micro (automated)  [81003] 7)  T- GC Chlamydia [id number] 8)  T-Pap Smear, Thin Prep [88142] 9)  T-Syphilis Test (RPR) [id number] 10)  T-HIV Antibody  (Reflex) [id number] 11)  T-CBC w/Diff AT:[id number] 12)  T-Comprehensive Metabolic Panel [id number] 13)  T-Lipid Profile [id number]    Preventive Care Screening  Colonoscopy:    Date:  04/11/2010    Results:  Internal Hemorrhoids, diverticulosis.  No polyps:   Dr. Watt Climes, [facility]--recommended repeat in 5 years.   Prior Values:    Pap Smear:  ATYPICAL SQUAMOUS CELLS OF UNDETERMINED SIGNIFICANCE (ASC-US). (06/07/2009)    Mammogram:  ASSESSMENT: Negative - BI-RADS 1^MM DIGITAL SCREENING (06/14/2009)    Last Tetanus Booster:  Tdap (12/21/2008)    Last Flu Shot:  Fluvax Non-MCR (01/10/2010)    Last Pneumovax:  Historical (04/02/2005)     Has appt. for mammogram on 06/15/10 SBE:  Regularly--no changes Guaiac Cards:  Performed last year--not in flow sheet. Osteoprevention:  Has never had a bone density.  No regular dairy intake.  Is taking Calcium with Vitamin D regularly.  No regular physical activity.     Laboratory Results    [place]et [place]/KOH Source: vaginal WBC/hpf: 1-5 Bacteria/hpf: 1+ Clue cells/hpf: none  Negative whiff Yeast/hpf: none Trichomonas/hpf: none    Appended Document: UA     Allergies: No Known Drug Allergies   Complete Medication List: 1)  Toprol Xl 100 Mg Xr24h-tab (Metoprolol succinate) .Marland Kitchen.. 1 1/2 tabs by mouth daily 2)  Cozaar 100 Mg Tabs (Losartan potassium) .Marland Kitchen.. 1 tab daily 3)  Plavix 75 Mg Tabs (Clopidogrel bisulfate) .Marland Kitchen.. 1 tab daily 4)  Norvasc 5 Mg Tabs (Amlodipine besylate) .Marland Kitchen.. 1 tab daily 5)  Lipitor 20 Mg Tabs (Atorvastatin calcium) .Marland Kitchen.. 1 tab daily 6)  Hydrochlorothiazide 25 Mg Tabs (Hydrochlorothiazide) .Marland Kitchen.. 1 tab daily 7)  Bayer Aspirin Ec Low Dose 81 Mg Tbec (Aspirin) .Marland Kitchen.. 1 tab daily 8)  Advair Diskus 100-50 Mcg/dose Misc (Fluticasone-salmeterol) .Marland Kitchen.. 1 inhalation two times a day 9)  Ventolin Hfa 108 (90 Base) Mcg/act Aers (Albuterol sulfate) .... 2 puffs every 6 hours as needed for wheezing 10)  Famotidine 40 Mg Tabs (Famotidine) .Marland Kitchen.. 1 tab by mouth two times a  day -take 1/2 hour before breakfast and dinner 11)  Ferrous Sulfate 325 (65 Fe) Mg Tabs (Ferrous sulfate) .Marland Kitchen.. 1 tab by mouth daily 12)  Oyster Shell Calcium/d 500-200 Mg-unit Tabs (Calcium-vitamin d) .Marland Kitchen.. 1 tab by mouth two times a  day 13)  Trazodone Hcl 50 Mg Tabs (Trazodone hcl) .Marland Kitchen.. 1-2 tabs by mouth at bedtime for sleep 14)  Loratadine 10 Mg Tabs (Loratadine) .Marland Kitchen.. 1 tab by mouth daily 15)  Chantix 0.5 Mg Tabs (Varenicline tartrate) .Marland Kitchen.. 1 tab by mouth daily for 3 days, then increase to 1 tab by mouth two times a day until day #8. day #8, start 1 mg by mouth two times a day 16)  Chantix 1 Mg Tabs (Varenicline tartrate) .Marland Kitchen.. 1 tab by mouth two times a day starting on day #8 of treatment--continue this dose      Laboratory Results   Urine Tests  Date/Time Received: June 13, 2010 10:38 AM   Routine Urinalysis   Color: lt. yellow Glucose: negative   (Normal Range: Negative) Bilirubin: negative   (Normal Range: Negative) Ketone: negative   (Normal Range: Negative) Spec. Gravity: 1.025   (Normal Range: 1.003-1.035) Blood: negative   (Normal Range: Negative) pH: 5.0   (Normal Range: 5.0-8.0) Protein: negative   (Normal Range: Negative) Urobilinogen: 0.2   (Normal Range: 0-1) Nitrite: negative   (Normal Range: Negative) Leukocyte Esterace: negative   (Normal Range: Negative)       Appended Document: Rapid HIV     Allergies: No Known Drug Allergies   Complete Medication List: 1)  Toprol Xl 100 Mg Xr24h-tab (Metoprolol succinate) .Marland Kitchen.. 1 1/2 tabs by mouth daily 2)  Cozaar 100 Mg Tabs (Losartan potassium) .Marland Kitchen.. 1 tab daily 3)  Plavix 75 Mg Tabs (Clopidogrel bisulfate) .Marland Kitchen.. 1 tab daily 4)  Norvasc 5 Mg Tabs (Amlodipine besylate) .Marland Kitchen.. 1 tab daily 5)  Lipitor 20 Mg Tabs (Atorvastatin calcium) .Marland Kitchen.. 1 tab daily 6)  Hydrochlorothiazide 25 Mg Tabs (Hydrochlorothiazide) .Marland Kitchen.. 1 tab daily 7)  Bayer Aspirin Ec Low Dose 81 Mg Tbec (Aspirin) .Marland Kitchen.. 1 tab daily 8)  Advair Diskus 100-50 Mcg/dose Misc (Fluticasone-salmeterol) .Marland Kitchen.. 1 inhalation two times a day 9)  Ventolin Hfa 108 (90 Base) Mcg/act Aers (Albuterol sulfate) .... 2 puffs every 6 hours as needed for wheezing 10)  Famotidine 40 Mg Tabs (Famotidine) .Marland Kitchen.. 1  tab by mouth two times a day -take 1/2 hour before breakfast and dinner 11)  Ferrous Sulfate 325 (65 Fe) Mg Tabs (Ferrous sulfate) .Marland Kitchen.. 1 tab by mouth daily 12)  Oyster Shell Calcium/d 500-200 Mg-unit Tabs (Calcium-vitamin d) .Marland Kitchen.. 1 tab by mouth two times a day 13)  Trazodone Hcl 50 Mg Tabs (Trazodone hcl) .Marland Kitchen.. 1-2 tabs by mouth at bedtime for sleep 14)  Loratadine 10 Mg Tabs (Loratadine) .Marland Kitchen.. 1 tab by mouth daily 15)  Chantix 0.5 Mg Tabs (Varenicline tartrate) .Marland Kitchen.. 1 tab by mouth daily for 3 days, then increase to 1 tab by mouth two times a day until day #8. day #8, start 1 mg by mouth two times a day 16)  Chantix 1 Mg Tabs (Varenicline tartrate) .Marland Kitchen.. 1 tab by mouth two times a day starting on day #8 of treatment--continue this dose      Laboratory Results  Date/Time Received: June 13, 2010 2:20 PM   Other Tests  Rapid HIV: negative

## 2010-06-27 ENCOUNTER — Encounter (INDEPENDENT_AMBULATORY_CARE_PROVIDER_SITE_OTHER): Payer: Self-pay | Admitting: Internal Medicine

## 2010-07-04 NOTE — Letter (Signed)
Summary: Lipid Letter  [facility]  [address] [place], [place] [address]   Phone: [phone]  Fax: [phone]    06/27/2010  Ladawna Radillo [place], [place]  [address]  Dear Ms. Gendreau:  We have carefully reviewed your last lipid profile from 06/13/2010 and the results are noted below with a summary of recommendations for lipid management.    Cholesterol:       149     Goal: <200   HDL "good" Cholesterol:   47     Goal: >45   LDL "bad" Cholesterol:   82     Goal: <70   Triglycerides:       98     Goal: <150    Your LDL or bad cholesterol needs to be below 70--please make sure you are taking your Lipitor regularly and work on diet and exercise if there is room for improvement.  Your STD evaluation was negative (good), your pap and mammogram were normal.  Your other blood tests were okay, except maybe you were a bit dehydrated (possibly from fasting) Would like you to make an appt. to recheck your fasting cholesterol in 4 months.    TLC Diet (Therapeutic Lifestyle Change): Saturated Fats & Transfatty acids should be kept < 7% of total calories ***Reduce Saturated Fats Polyunstaurated Fat can be up to 10% of total calories Monounsaturated Fat Fat can be up to 20% of total calories Total Fat should be no greater than 25-35% of total calories Carbohydrates should be 50-60% of total calories Protein should be approximately 15% of total calories Fiber should be at least 20-30 grams a day ***Increased fiber may help lower LDL Total Cholesterol should be < 200mg /day Consider adding plant stanol/sterols to diet (example: Benacol spread) ***A higher intake of unsaturated fat may reduce Triglycerides and Increase HDL    Adjunctive Measures (may lower LIPIDS and reduce risk of Heart Attack) include: Aerobic Exercise (20-30 minutes 3-4 times a week) Limit Alcohol Consumption Weight Reduction Aspirin 75-81 mg a day by mouth (if not allergic  or contraindicated) Dietary Fiber 20-30 grams a day by mouth     Current Medications: 1)    Toprol Xl 100 Mg  Xr24h-tab (Metoprolol succinate) .Marland Kitchen.. 1 1/2 tabs by mouth daily 2)    Cozaar 100 Mg  Tabs (Losartan potassium) .Marland Kitchen.. 1 tab daily 3)    Plavix 75 Mg  Tabs (Clopidogrel bisulfate) .Marland Kitchen.. 1 tab daily 4)    Norvasc 5 Mg  Tabs (Amlodipine besylate) .Marland Kitchen.. 1 tab daily 5)    Lipitor 20 Mg  Tabs (Atorvastatin calcium) .Marland Kitchen.. 1 tab daily 6)    Hydrochlorothiazide 25 Mg  Tabs (Hydrochlorothiazide) .Marland Kitchen.. 1 tab daily 7)    Bayer Aspirin Ec Low Dose 81 Mg  Tbec (Aspirin) .Marland Kitchen.. 1 tab daily 8)    Advair Diskus 100-50 Mcg/dose  Misc (Fluticasone-salmeterol) .Marland Kitchen.. 1 inhalation two times a day 9)    Ventolin Hfa 108 (90 Base) Mcg/act Aers (Albuterol sulfate) .... 2 puffs every 6 hours as needed for wheezing 10)    Famotidine 40 Mg Tabs (Famotidine) .Marland Kitchen.. 1 tab by mouth two times a day -take 1/2 hour before breakfast and dinner 11)    Ferrous Sulfate 325 (65 Fe) Mg Tabs (Ferrous sulfate) .Marland Kitchen.. 1 tab by mouth daily 12)    Oyster Shell Calcium/d 500-200 Mg-unit Tabs (Calcium-vitamin d) .Marland Kitchen.. 1 tab by mouth two times a day 13)    Trazodone Hcl 50  Mg Tabs (Trazodone hcl) .Marland Kitchen.. 1-2 tabs by mouth at bedtime for sleep 14)    Loratadine 10 Mg Tabs (Loratadine) .Marland Kitchen.. 1 tab by mouth daily 15)    Chantix 0.5 Mg Tabs (Varenicline tartrate) .Marland Kitchen.. 1 tab by mouth daily for 3 days, then increase to 1 tab by mouth two times a day until day #8. day #8, start 1 mg by mouth two times a day 16)    Chantix 1 Mg Tabs (Varenicline tartrate) .Marland Kitchen.. 1 tab by mouth two times a day starting on day #8 of treatment--continue this dose  If you have any questions, please call. We appreciate being able to work with you.   Sincerely,    [facility] Mack Hook MD

## 2010-08-15 NOTE — Discharge Summary (Signed)
NAME:  CALISE, BUTE NO.:  [id number]   MEDICAL RECORD NO.:  [id number]          PATIENT TYPE:  INP   LOCATION:  2912                         FACILITY:  [facility]   PHYSICIAN:  Cyndia Bent, N.P.     DATE OF BIRTH:  02-05-1946   DATE OF ADMISSION:  08/04/2007  DATE OF DISCHARGE:  08/06/2007                               DISCHARGE SUMMARY    [name] is a 65 year old African American female patient with a prior  medical history of hypertension, COPD, tobacco use, and obesity.  She  came to the emergency room with chest pain.  She was admitted to rule  out an MI.  Her initial troponin was elevated at 0.12.  It was decided  she should undergo cardiac catheterization, this was performed on Aug 05, 2007.  She has 60-70% EF.  She had a 75-90% mid-RCA lesion, single-  vessel disease.  Her LAD and circumflex were clear.  She underwent  PROMUS stenting by Dr. Myrtice Lauth with a 2.5 x 23 mm stent.  She was  given Plavix 600 mg in the lab, and the following day, she was doing  well.   Her labs showed a hemoglobin of 10.9, hematocrit 33.8, WBC 7.4, and  platelets 166.  Sodium was 139, potassium 3.9, BUN 7 and creatinine  1.06.  Her CK-MBs were not elevated, she was 227/2.0 and troponin was  0.01, #3 was 180/1.9 and troponin of 0.05.  Blood pressure was 151/61,  heart rate was 72.  She was seen by cardiac rehab and referred for  cardiac rehab phase II.  I did sign a form for Plavix __________  Program.  Other labs, total cholesterol was 170, LDL was 114, HDL was  35, triglycerides were 103.  AST was 14, ALT was 15, BNP was 42,  glycosylated hemoglobin was 6.1.  Her TSH was 2.394.   DISCHARGE MEDICATIONS:  1. Aspirin 81 mg 2 per day.  2. Toprol XL 100 mg daily.  3. Cozaar 100 mg daily.  4. Simvastatin 40 mg at bedtime.  5. Amlodipine 5 mg a day.  6. Plavix 75 mg a day.  She is not to stop that.  7. Nitroglycerin 1/150 under the tongue 5 minutes as needed for chest   pain.  8. Pepcid 20 mg daily.  She can buy that over-the-counter.   She will follow up with Dr. Melvern Banker on Aug 15, 2007, at 10:30.  She was  told not do any strenuous activity, lifting, pushing, pulling, or  extended walking for 1 week.  She should call our office if she has any  problems with her groin.  She will be on a low-sodium diet.  Also, her  chest x-ray showed no acute chest findings.   DISCHARGE DIAGNOSES:  1. Acute coronary syndrome.  2. Status post cardiac catheterization with single-vessel right      coronary artery disease, status post drug-eluting stents stenting      with a PROMUS stent to her mid-left anterior descending 2.5 x 23.  3. Normal ejection fraction of 60-70%.  4. Hypertension.  5. Hyperlipidemia.  6. Tobacco smoking.      Cyndia Bent, N.P.     BB/MEDQ  D:  08/06/2007  T:  08/07/2007  Job:  [id number]

## 2010-09-21 ENCOUNTER — Other Ambulatory Visit (HOSPITAL_COMMUNITY): Payer: Self-pay | Admitting: Radiology

## 2011-05-23 ENCOUNTER — Other Ambulatory Visit: Payer: Self-pay | Admitting: Internal Medicine

## 2011-05-23 DIAGNOSIS — R51 Headache: Secondary | ICD-10-CM

## 2011-05-23 DIAGNOSIS — R110 Nausea: Secondary | ICD-10-CM

## 2011-05-28 ENCOUNTER — Other Ambulatory Visit (HOSPITAL_COMMUNITY): Payer: Self-pay

## 2011-05-30 ENCOUNTER — Ambulatory Visit (HOSPITAL_COMMUNITY)
Admission: RE | Admit: 2011-05-30 | Discharge: 2011-05-30 | Disposition: A | Discharge status: Home / Self Care | Payer: Self-pay | Source: Ambulatory Visit | Attending: Internal Medicine | Admitting: Internal Medicine

## 2011-05-30 DIAGNOSIS — R110 Nausea: Secondary | ICD-10-CM | POA: Insufficient documentation

## 2011-05-30 DIAGNOSIS — R42 Dizziness and giddiness: Secondary | ICD-10-CM | POA: Insufficient documentation

## 2011-05-30 DIAGNOSIS — R51 Headache: Secondary | ICD-10-CM

## 2011-05-30 MED ORDER — IOHEXOL 300 MG/ML  SOLN
75.0000 mL | Freq: Once | INTRAMUSCULAR | Status: AC | PRN
  Administered 2011-05-30: 75 mL via INTRAVENOUS

## 2011-07-02 ENCOUNTER — Other Ambulatory Visit (HOSPITAL_COMMUNITY): Payer: Self-pay | Admitting: Family Medicine

## 2011-07-02 DIAGNOSIS — Z1231 Encounter for screening mammogram for malignant neoplasm of breast: Secondary | ICD-10-CM

## 2011-07-26 ENCOUNTER — Ambulatory Visit (HOSPITAL_COMMUNITY)
Admission: RE | Admit: 2011-07-26 | Discharge: 2011-07-26 | Disposition: A | Discharge status: Home / Self Care | Payer: Self-pay | Source: Ambulatory Visit | Attending: Family Medicine | Admitting: Family Medicine

## 2011-07-26 DIAGNOSIS — Z1231 Encounter for screening mammogram for malignant neoplasm of breast: Secondary | ICD-10-CM | POA: Insufficient documentation

## 2011-09-18 ENCOUNTER — Emergency Department (HOSPITAL_COMMUNITY): Payer: No Typology Code available for payment source

## 2011-09-18 ENCOUNTER — Emergency Department (HOSPITAL_COMMUNITY)
Admission: EM | Admit: 2011-09-18 | Discharge: 2011-09-19 | Disposition: A | Discharge status: Home / Self Care | Payer: No Typology Code available for payment source | Attending: Emergency Medicine | Admitting: Emergency Medicine

## 2011-09-18 ENCOUNTER — Encounter (HOSPITAL_COMMUNITY): Payer: Self-pay

## 2011-09-18 DIAGNOSIS — M542 Cervicalgia: Secondary | ICD-10-CM | POA: Insufficient documentation

## 2011-09-18 DIAGNOSIS — I252 Old myocardial infarction: Secondary | ICD-10-CM | POA: Insufficient documentation

## 2011-09-18 DIAGNOSIS — R51 Headache: Secondary | ICD-10-CM | POA: Insufficient documentation

## 2011-09-18 DIAGNOSIS — I658 Occlusion and stenosis of other precerebral arteries: Secondary | ICD-10-CM | POA: Insufficient documentation

## 2011-09-18 DIAGNOSIS — R079 Chest pain, unspecified: Secondary | ICD-10-CM | POA: Insufficient documentation

## 2011-09-18 DIAGNOSIS — M549 Dorsalgia, unspecified: Secondary | ICD-10-CM | POA: Insufficient documentation

## 2011-09-18 DIAGNOSIS — I6529 Occlusion and stenosis of unspecified carotid artery: Secondary | ICD-10-CM | POA: Insufficient documentation

## 2011-09-18 DIAGNOSIS — T1490XA Injury, unspecified, initial encounter: Secondary | ICD-10-CM | POA: Insufficient documentation

## 2011-09-18 DIAGNOSIS — R109 Unspecified abdominal pain: Secondary | ICD-10-CM | POA: Insufficient documentation

## 2011-09-18 DIAGNOSIS — I10 Essential (primary) hypertension: Secondary | ICD-10-CM | POA: Insufficient documentation

## 2011-09-18 HISTORY — DX: Unspecified asthma, uncomplicated: J45.909

## 2011-09-18 LAB — BASIC METABOLIC PANEL
CO2: 23 mEq/L (ref 19–32)
Calcium: 9.8 mg/dL (ref 8.4–10.5)
Chloride: 101 mEq/L (ref 96–112)
Creatinine, Ser: 1.07 mg/dL (ref 0.50–1.10)
Glucose, Bld: 95 mg/dL (ref 70–99)

## 2011-09-18 LAB — DIFFERENTIAL
Eosinophils Relative: 1 % (ref 0–5)
Lymphocytes Relative: 24 % (ref 12–46)
Lymphs Abs: 2.6 10*3/uL (ref 0.7–4.0)
Monocytes Absolute: 0.8 10*3/uL (ref 0.1–1.0)
Monocytes Relative: 7 % (ref 3–12)
Neutro Abs: 7.5 10*3/uL (ref 1.7–7.7)

## 2011-09-18 LAB — CBC
HCT: 41.9 % (ref 36.0–46.0)
Hemoglobin: 13.8 g/dL (ref 12.0–15.0)
MCV: 80.1 fL (ref 78.0–100.0)
RBC: 5.23 MIL/uL — ABNORMAL HIGH (ref 3.87–5.11)
RDW: 15.4 % (ref 11.5–15.5)
WBC: 11 10*3/uL — ABNORMAL HIGH (ref 4.0–10.5)

## 2011-09-18 LAB — APTT: aPTT: 26 seconds (ref 24–37)

## 2011-09-18 MED ORDER — MORPHINE SULFATE 4 MG/ML IJ SOLN
4.0000 mg | Freq: Once | INTRAMUSCULAR | Status: AC
  Administered 2011-09-18: 4 mg via INTRAVENOUS
  Filled 2011-09-18: qty 1

## 2011-09-18 MED ORDER — ONDANSETRON HCL 4 MG/2ML IJ SOLN
4.0000 mg | Freq: Once | INTRAMUSCULAR | Status: AC
  Administered 2011-09-18: 4 mg via INTRAVENOUS
  Filled 2011-09-18: qty 2

## 2011-09-18 MED ORDER — SODIUM CHLORIDE 0.9 % IV BOLUS (SEPSIS)
500.0000 mL | Freq: Once | INTRAVENOUS | Status: AC
  Administered 2011-09-18: 500 mL via INTRAVENOUS
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
## 2011-09-19 ENCOUNTER — Emergency Department (HOSPITAL_COMMUNITY): Payer: No Typology Code available for payment source

## 2011-09-19 ENCOUNTER — Encounter (HOSPITAL_COMMUNITY): Payer: Self-pay | Admitting: Radiology

## 2011-09-19 MED ORDER — IOHEXOL 300 MG/ML  SOLN
80.0000 mL | Freq: Once | INTRAMUSCULAR | Status: AC | PRN
  Administered 2011-09-19: 80 mL via INTRAVENOUS

## 2011-09-19 MED ORDER — CYCLOBENZAPRINE HCL 10 MG PO TABS: 5.0000 mg | ORAL_TABLET | Freq: Every evening | ORAL | Status: DC | PRN

## 2011-09-19 MED ORDER — OXYCODONE-ACETAMINOPHEN 5-325 MG PO TABS: 1.0000 | ORAL_TABLET | ORAL | Status: AC | PRN

## 2011-09-19 NOTE — ED Notes (Signed)
Pt returns from ct scan. 
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
## 2011-09-19 NOTE — ED Notes (Signed)
Pt waiting for ct scan. Pt informed of delay.

## 2011-09-19 NOTE — ED Notes (Signed)
Pt in ct scan  
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
## 2011-09-26 ENCOUNTER — Encounter (HOSPITAL_COMMUNITY): Payer: Self-pay | Admitting: *Deleted

## 2011-09-26 ENCOUNTER — Emergency Department (HOSPITAL_COMMUNITY)
Admission: EM | Admit: 2011-09-26 | Discharge: 2011-09-26 | Disposition: A | Discharge status: Home / Self Care | Payer: Medicare Other | Attending: Emergency Medicine | Admitting: Emergency Medicine

## 2011-09-26 ENCOUNTER — Emergency Department (HOSPITAL_COMMUNITY): Payer: Medicare Other

## 2011-09-26 DIAGNOSIS — F17200 Nicotine dependence, unspecified, uncomplicated: Secondary | ICD-10-CM | POA: Insufficient documentation

## 2011-09-26 DIAGNOSIS — J45909 Unspecified asthma, uncomplicated: Secondary | ICD-10-CM | POA: Insufficient documentation

## 2011-09-26 DIAGNOSIS — R0789 Other chest pain: Secondary | ICD-10-CM

## 2011-09-26 DIAGNOSIS — I252 Old myocardial infarction: Secondary | ICD-10-CM | POA: Insufficient documentation

## 2011-09-26 DIAGNOSIS — I10 Essential (primary) hypertension: Secondary | ICD-10-CM | POA: Insufficient documentation

## 2011-09-26 DIAGNOSIS — R071 Chest pain on breathing: Secondary | ICD-10-CM | POA: Insufficient documentation

## 2011-09-26 MED ORDER — MORPHINE SULFATE 4 MG/ML IJ SOLN
6.0000 mg | Freq: Once | INTRAMUSCULAR | Status: AC
  Administered 2011-09-26: 6 mg via INTRAMUSCULAR
  Filled 2011-09-26: qty 2

## 2011-09-26 MED ORDER — OXYCODONE-ACETAMINOPHEN 5-325 MG PO TABS: 1.0000 | ORAL_TABLET | ORAL | Status: AC | PRN

## 2011-09-26 MED ORDER — ONDANSETRON 8 MG PO TBDP
8.0000 mg | ORAL_TABLET | Freq: Once | ORAL | Status: AC
  Administered 2011-09-26: 8 mg via ORAL
  Filled 2011-09-26: qty 1

## 2011-09-26 NOTE — ED Provider Notes (Signed)
History/physical exam/procedure(s) were performed by non-physician practitioner and as supervising physician I was immediately available for consultation/collaboration. I have reviewed all notes and am in agreement with care and plan.   Shaune Pollack, MD 09/26/11 [phone]
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
## 2011-09-26 NOTE — Discharge Instructions (Signed)
Ibuprofen for pain. Percocet for severe pain as prescribed as needed. Try heating pads to the area. Incentive spirometer every 2 hrs.  Follow up with a primary care doctor. Return if symptoms are worsening.   Chest Wall Pain Chest wall pain is pain in or around the bones and muscles of your chest. It may take up to 6 weeks to get better. It may take longer if you must stay physically active in your work and activities.  CAUSES  Chest wall pain may happen on its own. However, it may be caused by:  A viral illness like the flu.   Injury.   Coughing.   Exercise.   Arthritis.   Fibromyalgia.   Shingles.  HOME CARE INSTRUCTIONS   Avoid overtiring physical activity. Try not to strain or perform activities that cause pain. This includes any activities using your chest or your abdominal and side muscles, especially if heavy weights are used.   Put ice on the sore area.   Put ice in a plastic bag.   Place a towel between your skin and the bag.   Leave the ice on for 15 to 20 minutes per hour while awake for the first 2 days.   Only take over-the-counter or prescription medicines for pain, discomfort, or fever as directed by your caregiver.  SEEK IMMEDIATE MEDICAL CARE IF:   Your pain increases, or you are very uncomfortable.   You have a fever.   Your chest pain becomes worse.   You have new, unexplained symptoms.   You have nausea or vomiting.   You feel sweaty or lightheaded.   You have a cough with phlegm (sputum), or you cough up blood.  MAKE SURE YOU:   Understand these instructions.   Will watch your condition.   Will get help right away if you are not doing well or get worse.  Document Released: 03/19/2005 Document Revised: 03/08/2011 Document Reviewed: 11/13/2010 [facility] Patient Information 2012 [place].  Rib Contusion A rib contusion (bruise) can occur by a blow to the chest or by a fall against a hard object. Usually these will be much better in a  couple weeks. If X-rays were taken today and there are no broken bones (fractures), the diagnosis of bruising is made. However, broken ribs may not show up for several days, or may be discovered later on a routine X-ray when signs of healing show up. If this happens to you, it does not mean that something was missed on the X-ray, but simply that it did not show up on the first X-rays. Earlier diagnosis will not usually change the treatment. HOME CARE INSTRUCTIONS   Avoid strenuous activity. Be careful during activities and avoid bumping the injured ribs. Activities that pull on the injured ribs and cause pain should be avoided, if possible.   For the first day or two, an ice pack used every 20 minutes while awake may be helpful. Put ice in a plastic bag and put a towel between the bag and the skin.   Eat a normal, well-balanced diet. Drink plenty of fluids to avoid constipation.   Take deep breaths several times a day to keep lungs free of infection. Try to cough several times a day. Splint the injured area with a pillow while coughing to ease pain. Coughing can help prevent pneumonia.   Wear a rib belt or binder only if told to do so by your caregiver. If you are wearing a rib belt or binder, you must  do the breathing exercises as directed by your caregiver. If not used properly, rib belts or binders restrict breathing which can lead to pneumonia.   Only take over-the-counter or prescription medicines for pain, discomfort, or fever as directed by your caregiver.  SEEK MEDICAL CARE IF:   You or your child has an oral temperature above 102 F (38.9 C).   Your baby is older than 3 months with a rectal temperature of 100.5 F (38.1 C) or higher for more than 1 day.   You develop a cough, with thick or bloody sputum.  SEEK IMMEDIATE MEDICAL CARE IF:   You have difficulty breathing.   You feel sick to your stomach (nausea), have vomiting or belly (abdominal) pain.   You have worsening pain,  not controlled with medications, or there is a change in the location of the pain.   You develop sweating or radiation of the pain into the arms, jaw or shoulders, or become light headed or faint.   You or your child has an oral temperature above 102 F (38.9 C), not controlled by medicine.   Your or your baby is older than 3 months with a rectal temperature of 102 F (38.9 C) or higher.   Your baby is 72 months old or younger with a rectal temperature of 100.4 F (38 C) or higher.  MAKE SURE YOU:   Understand these instructions.   Will watch your condition.   Will get help right away if you are not doing well or get worse.  Document Released: 12/12/2000 Document Revised: 03/08/2011 Document Reviewed: 11/05/2007 [facility] Patient Information 2012 [place].
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
## 2012-06-02 ENCOUNTER — Encounter (HOSPITAL_COMMUNITY): Payer: Self-pay | Admitting: *Deleted

## 2012-06-02 ENCOUNTER — Encounter (HOSPITAL_COMMUNITY): Payer: Self-pay | Admitting: Emergency Medicine

## 2012-06-02 ENCOUNTER — Emergency Department (HOSPITAL_COMMUNITY)
Admission: EM | Admit: 2012-06-02 | Discharge: 2012-06-02 | Disposition: A | Discharge status: Home / Self Care | Payer: Medicare Other | Source: Home / Self Care | Attending: Family Medicine | Admitting: Family Medicine

## 2012-06-02 ENCOUNTER — Observation Stay (HOSPITAL_COMMUNITY)
Admission: EM | Admit: 2012-06-02 | Discharge: 2012-06-03 | Disposition: A | Discharge status: Home / Self Care | Payer: Medicare Other | Attending: Internal Medicine | Admitting: Internal Medicine

## 2012-06-02 ENCOUNTER — Emergency Department (HOSPITAL_COMMUNITY): Payer: Medicare Other

## 2012-06-02 DIAGNOSIS — Z87891 Personal history of nicotine dependence: Secondary | ICD-10-CM | POA: Diagnosis present

## 2012-06-02 DIAGNOSIS — I252 Old myocardial infarction: Secondary | ICD-10-CM | POA: Insufficient documentation

## 2012-06-02 DIAGNOSIS — F17200 Nicotine dependence, unspecified, uncomplicated: Secondary | ICD-10-CM | POA: Insufficient documentation

## 2012-06-02 DIAGNOSIS — R110 Nausea: Secondary | ICD-10-CM | POA: Insufficient documentation

## 2012-06-02 DIAGNOSIS — R079 Chest pain, unspecified: Secondary | ICD-10-CM

## 2012-06-02 DIAGNOSIS — R0602 Shortness of breath: Secondary | ICD-10-CM | POA: Insufficient documentation

## 2012-06-02 DIAGNOSIS — I2510 Atherosclerotic heart disease of native coronary artery without angina pectoris: Secondary | ICD-10-CM

## 2012-06-02 DIAGNOSIS — I10 Essential (primary) hypertension: Secondary | ICD-10-CM

## 2012-06-02 DIAGNOSIS — Z9861 Coronary angioplasty status: Secondary | ICD-10-CM | POA: Insufficient documentation

## 2012-06-02 DIAGNOSIS — J45909 Unspecified asthma, uncomplicated: Secondary | ICD-10-CM

## 2012-06-02 HISTORY — DX: Atherosclerotic heart disease of native coronary artery without angina pectoris: I25.10

## 2012-06-02 LAB — COMPREHENSIVE METABOLIC PANEL
ALT: 9 U/L (ref 0–35)
Alkaline Phosphatase: 65 U/L (ref 39–117)
GFR calc Af Amer: 61 mL/min — ABNORMAL LOW (ref >90)
Glucose, Bld: 85 mg/dL (ref 70–99)
Potassium: 3.7 mEq/L (ref 3.5–5.1)
Sodium: 137 mEq/L (ref 135–145)
Total Protein: 7.5 g/dL (ref 6.0–8.3)

## 2012-06-02 LAB — CBC
Hemoglobin: 11.9 g/dL — ABNORMAL LOW (ref 12.0–15.0)
MCHC: 32.7 g/dL (ref 30.0–36.0)
RBC: 4.58 MIL/uL (ref 3.87–5.11)

## 2012-06-02 MED ORDER — ASPIRIN 325 MG PO TABS
325.0000 mg | ORAL_TABLET | ORAL | Status: AC
  Administered 2012-06-02: 325 mg via ORAL

## 2012-06-02 MED ORDER — HEPARIN SODIUM (PORCINE) 5000 UNIT/ML IJ SOLN
5000.0000 [IU] | Freq: Three times a day (TID) | INTRAMUSCULAR | Status: DC
  Administered 2012-06-03 (×2): 5000 [IU] via SUBCUTANEOUS
  Filled 2012-06-02 (×4): qty 1

## 2012-06-02 MED ORDER — NITROGLYCERIN 0.4 MG SL SUBL
0.4000 mg | SUBLINGUAL_TABLET | SUBLINGUAL | Status: AC | PRN
  Administered 2012-06-02 (×3): 0.4 mg via SUBLINGUAL

## 2012-06-02 MED ORDER — PANTOPRAZOLE SODIUM 40 MG IV SOLR
40.0000 mg | Freq: Every day | INTRAVENOUS | Status: DC
  Administered 2012-06-03: 40 mg via INTRAVENOUS
  Filled 2012-06-02 (×4): qty 40

## 2012-06-02 MED ORDER — FERROUS SULFATE 325 (65 FE) MG PO TABS
325.0000 mg | ORAL_TABLET | Freq: Every day | ORAL | Status: DC
  Administered 2012-06-03: 325 mg via ORAL
  Filled 2012-06-02 (×2): qty 1

## 2012-06-02 MED ORDER — MOMETASONE FURO-FORMOTEROL FUM 100-5 MCG/ACT IN AERO
2.0000 | INHALATION_SPRAY | Freq: Two times a day (BID) | RESPIRATORY_TRACT | Status: DC
  Administered 2012-06-03 (×2): 2 via RESPIRATORY_TRACT
  Filled 2012-06-02: qty 8.8

## 2012-06-02 MED ORDER — MORPHINE SULFATE 4 MG/ML IJ SOLN
4.0000 mg | Freq: Once | INTRAMUSCULAR | Status: AC
  Administered 2012-06-02: 4 mg via INTRAVENOUS
  Filled 2012-06-02: qty 1

## 2012-06-02 MED ORDER — SODIUM CHLORIDE 0.9 % IV SOLN
INTRAVENOUS | Status: DC
  Administered 2012-06-03: 01:00:00 via INTRAVENOUS

## 2012-06-02 MED ORDER — METOPROLOL SUCCINATE ER 50 MG PO TB24
150.0000 mg | ORAL_TABLET | Freq: Every day | ORAL | Status: DC
  Administered 2012-06-03: 150 mg via ORAL
  Filled 2012-06-02: qty 1

## 2012-06-02 MED ORDER — HYDROCHLOROTHIAZIDE 12.5 MG PO CAPS
12.5000 mg | ORAL_CAPSULE | Freq: Every day | ORAL | Status: DC
  Administered 2012-06-03: 12.5 mg via ORAL
  Filled 2012-06-02: qty 1

## 2012-06-02 MED ORDER — ASPIRIN 81 MG PO CHEW
CHEWABLE_TABLET | ORAL | Status: AC
  Filled 2012-06-02: qty 4

## 2012-06-02 MED ORDER — NITROGLYCERIN 0.4 MG SL SUBL
SUBLINGUAL_TABLET | SUBLINGUAL | Status: AC
  Filled 2012-06-02: qty 25

## 2012-06-02 MED ORDER — ACETAMINOPHEN 325 MG PO TABS: 650.0000 mg | ORAL_TABLET | Freq: Four times a day (QID) | ORAL | Status: DC | PRN

## 2012-06-02 MED ORDER — ALBUTEROL SULFATE HFA 108 (90 BASE) MCG/ACT IN AERS
2.0000 | INHALATION_SPRAY | RESPIRATORY_TRACT | Status: DC | PRN
  Filled 2012-06-02: qty 6.7

## 2012-06-02 MED ORDER — MORPHINE SULFATE 2 MG/ML IJ SOLN: 2.0000 mg | INTRAMUSCULAR | Status: DC | PRN

## 2012-06-02 MED ORDER — LOSARTAN POTASSIUM 25 MG PO TABS
25.0000 mg | ORAL_TABLET | Freq: Every day | ORAL | Status: DC
  Administered 2012-06-03: 25 mg via ORAL
  Filled 2012-06-02: qty 1

## 2012-06-02 MED ORDER — ATORVASTATIN CALCIUM 20 MG PO TABS
20.0000 mg | ORAL_TABLET | Freq: Every day | ORAL | Status: DC
Start: 2012-06-03 — End: 2012-06-03
  Administered 2012-06-03: 20 mg via ORAL
  Filled 2012-06-02: qty 1

## 2012-06-02 MED ORDER — SODIUM CHLORIDE 0.9 % IJ SOLN
3.0000 mL | Freq: Two times a day (BID) | INTRAMUSCULAR | Status: DC
  Administered 2012-06-03: 3 mL via INTRAVENOUS

## 2012-06-02 MED ORDER — ONDANSETRON HCL 4 MG/2ML IJ SOLN: 4.0000 mg | Freq: Four times a day (QID) | INTRAMUSCULAR | Status: DC | PRN

## 2012-06-02 MED ORDER — SUCRALFATE 1 G PO TABS
1.0000 g | ORAL_TABLET | Freq: Three times a day (TID) | ORAL | Status: DC
  Administered 2012-06-03 (×4): 1 g via ORAL
  Filled 2012-06-02 (×6): qty 1

## 2012-06-02 MED ORDER — ACETAMINOPHEN 650 MG RE SUPP: 650.0000 mg | Freq: Four times a day (QID) | RECTAL | Status: DC | PRN

## 2012-06-02 MED ORDER — NITROGLYCERIN 0.4 MG SL SUBL
0.4000 mg | SUBLINGUAL_TABLET | SUBLINGUAL | Status: DC | PRN
  Administered 2012-06-02: 0.4 mg via SUBLINGUAL

## 2012-06-02 MED ORDER — ASPIRIN EC 81 MG PO TBEC: 81.0000 mg | DELAYED_RELEASE_TABLET | Freq: Every day | ORAL | Status: DC

## 2012-06-02 MED ORDER — ONDANSETRON HCL 4 MG PO TABS: 4.0000 mg | ORAL_TABLET | Freq: Four times a day (QID) | ORAL | Status: DC | PRN

## 2012-06-02 MED ORDER — CLOPIDOGREL BISULFATE 75 MG PO TABS
75.0000 mg | ORAL_TABLET | Freq: Every day | ORAL | Status: DC
Start: 2012-06-03 — End: 2012-06-03
  Administered 2012-06-03: 75 mg via ORAL
  Filled 2012-06-02: qty 1

## 2012-06-02 MED ORDER — AMLODIPINE BESYLATE 10 MG PO TABS
10.0000 mg | ORAL_TABLET | Freq: Every day | ORAL | Status: DC
  Administered 2012-06-03: 10 mg via ORAL
  Filled 2012-06-02: qty 1

## 2012-06-02 MED ORDER — ALUM & MAG HYDROXIDE-SIMETH 200-200-20 MG/5ML PO SUSP: 30.0000 mL | Freq: Four times a day (QID) | ORAL | Status: DC | PRN

## 2012-06-02 MED ORDER — ASPIRIN EC 81 MG PO TBEC
81.0000 mg | DELAYED_RELEASE_TABLET | Freq: Every day | ORAL | Status: DC
  Administered 2012-06-03: 81 mg via ORAL
  Filled 2012-06-02: qty 1

## 2012-06-02 MED ORDER — POTASSIUM CHLORIDE CRYS ER 20 MEQ PO TBCR
20.0000 meq | EXTENDED_RELEASE_TABLET | Freq: Two times a day (BID) | ORAL | Status: DC
  Administered 2012-06-02 – 2012-06-03 (×2): 20 meq via ORAL
  Filled 2012-06-02 (×3): qty 1

## 2012-06-02 NOTE — ED Notes (Signed)
EKG done in Urgent Care given to Dr Wyvonnia Dusky.

## 2012-06-02 NOTE — ED Provider Notes (Signed)
Medical screening examination/treatment/procedure(s) were conducted as a shared visit with non-physician practitioner(s) and myself.  I personally evaluated the patient during the encounter  Intermittent L sided chest pain radiating to axilla and back. Similar to previous MI. Hx stent, has not seen cards in 3 years. Associated with nausea. EKG nonsichemic.   Ezequiel Essex, MD 06/02/12 2140

## 2012-06-02 NOTE — ED Notes (Addendum)
Pt to ED by [facility] from urgent care with c/o chest pain  And back pain onset 4 days. Pt has stent placed. Urgent Care given nitro x1 and ASA 325 mg. Per EMS BP 183/97, O2 98% on 2L.

## 2012-06-02 NOTE — ED Provider Notes (Signed)
Medical screening examination/treatment/procedure(s) were performed by resident physician or non-physician practitioner and as supervising physician I was immediately available for consultation/collaboration.   Pauline Good MD.   Billy Fischer, MD 06/02/12 [phone]

## 2012-06-02 NOTE — ED Notes (Signed)
**Note De-Identified Guinn Delarosa Obfuscation** Placed  On  Cardiac  Monitor  And  Nasal  o2  At  2  l / min

## 2012-06-02 NOTE — ED Provider Notes (Signed)
History     CSN: [id number]  Arrival date & time 06/02/12  1459   None     Chief Complaint  Patient presents with  . Chest Pain    (Consider location/radiation/quality/duration/timing/severity/associated sxs/prior treatment) HPI Comments: Pt with prior cardiac cath with back pain for 4 days, L chest pain last night that has now moved into substernal area. Pain is in different location than previous chest pain but feels similar in quality. Did not take aspirin today.   Patient is a 67 y.o. female presenting with chest pain. The history is provided by the patient.  Chest Pain Pain location:  Substernal area Pain quality: sharp   Pain radiates to:  Upper back and mid back Pain radiates to the back: yes   Pain severity:  Severe Onset quality:  Gradual Timing:  Constant Progression:  Worsening Chronicity:  New Context: at rest   Relieved by:  None tried Ineffective treatments:  None tried Associated symptoms: back pain   Associated symptoms: no diaphoresis, no fever, no palpitations and no shortness of breath   Risk factors: coronary artery disease, hypertension and smoking     Past Medical History  Diagnosis Date  . Asthma   . Hypertension   . MI (myocardial infarction)     Past Surgical History  Procedure Laterality Date  . Cardiac catheterization      History reviewed. No pertinent family history.  History  Substance Use Topics  . Smoking status: Current Every Day Smoker -- 0.50 packs/day  . Smokeless tobacco: Not on file  . Alcohol Use: Yes     Comment: social    OB History   Grav Para Term Preterm Abortions TAB SAB Ect Mult Living   1 1              Review of Systems  Constitutional: Negative for fever, chills and diaphoresis.  Respiratory: Negative for shortness of breath.   Cardiovascular: Positive for chest pain. Negative for palpitations.  Musculoskeletal: Positive for back pain.    Allergies  Review of patient's allergies indicates no known  allergies.  Home Medications   Current Outpatient Rx  Name  Route  Sig  Dispense  Refill  . HYDROCHLOROTHIAZIDE PO   Oral   Take by mouth.         . Losartan Potassium (COZAAR PO)   Oral   Take by mouth.         . Metoprolol Succinate (TOPROL XL PO)   Oral   Take by mouth.         . Potassium (POTASSIMIN PO)   Oral   Take by mouth.         Marland Kitchen albuterol (PROVENTIL HFA;VENTOLIN HFA) 108 (90 BASE) MCG/ACT inhaler   Inhalation   Inhale 2 puffs into the lungs every 4 (four) hours as needed. For shortness of pain         . aspirin EC 81 MG tablet   Oral   Take 81 mg by mouth daily.         . cyclobenzaprine (FLEXERIL) 10 MG tablet   Oral   Take 10 mg by mouth at bedtime.         . Fluticasone-Salmeterol (ADVAIR) 250-50 MCG/DOSE AEPB   Inhalation   Inhale 1 puff into the lungs every 12 (twelve) hours.           BP 182/90  Pulse 82  Temp(Src) 98.6 F (37 C) (Oral)  SpO2 99%  Physical Exam  Constitutional: She appears well-developed and well-nourished. She is cooperative. She does not appear ill. No distress.  Anxious appearing  Cardiovascular: Normal rate and regular rhythm.   Pulmonary/Chest: Effort normal and breath sounds normal. She exhibits no tenderness.  Musculoskeletal:       Thoracic back: She exhibits no tenderness, no bony tenderness, no edema, no deformity and no pain.  Neurological: She is alert.    ED Course  Procedures (including critical care time)  Labs Reviewed - No data to display No results found.   1. Chest pain       MDM  Pt with prior cardiac cath with back pain for 4 days, L chest pain last night that has now moved into substernal area. Pain is in different location than previous chest pain but feels similar in quality. Transfer to ER for further care. Given asa and nitrox1 in [place].   EKG NSR, 76bpm.         Carvel Getting, NP 06/02/12 1556

## 2012-06-02 NOTE — ED Provider Notes (Signed)
History     CSN: [id number]  Arrival date & time 06/02/12  1658   First MD Initiated Contact with Patient 06/02/12 1709      Chief Complaint  Patient presents with  . Chest Pain    (Consider location/radiation/quality/duration/timing/severity/associated sxs/prior treatment) HPI  Kayla Mcclure is a 67 y.o. female c/osubsternal chest pain starting approximally 6 hours ago, she had pain in her back starting 3 days ago this pain has changed in location moving from the left back to the left axilla to the left breast and then today to the left of area. She describes the pain as sharp, it is rated at 8/10 at worst, 5/10 right now. Patient was seen at urgent care and given full dose aspirin and sublingual nitroglycerin which improved the pain. She states that it is different from her prior heart attack in severity and length of symptoms but not in character. Pain is associated with nausea and GI upset. She denies emesis, diaphoresis,shortness of breath, vomiting, fever, syncope, recent immobilization, leg swelling. Patient's the pain is exacerbated when she bends down to pick up her grandchild and at this point she describes it as pressure-like and associated with shortness of breath.  She has no primary care she was a former [facility] patient.  RF: active daily smoker with extensive history, prior MI, hypertension Cath: 2009, 1 Stent RCA Cardiologost: Melvern Banker retired 3 years ago. SE PCP: [facility]  Past Medical History  Diagnosis Date  . Asthma   . Hypertension   . MI (myocardial infarction)     Past Surgical History  Procedure Laterality Date  . Cardiac catheterization      History reviewed. No pertinent family history.  History  Substance Use Topics  . Smoking status: Current Every Day Smoker -- 1.00 packs/day    Types: Cigarettes  . Smokeless tobacco: Not on file  . Alcohol Use: Yes     Comment: social    OB History   Grav Para Term Preterm Abortions TAB SAB Ect  Mult Living   1 1              Review of Systems  Constitutional: Negative for fever.  Respiratory: Positive for shortness of breath.   Cardiovascular: Positive for chest pain.  Gastrointestinal: Negative for nausea, vomiting, abdominal pain and diarrhea.  All other systems reviewed and are negative.    Allergies  Review of patient's allergies indicates no known allergies.  Home Medications   Current Outpatient Rx  Name  Route  Sig  Dispense  Refill  . albuterol (PROVENTIL HFA;VENTOLIN HFA) 108 (90 BASE) MCG/ACT inhaler   Inhalation   Inhale 2 puffs into the lungs every 4 (four) hours as needed. For shortness of pain         . amLODipine (NORVASC) 10 MG tablet   Oral   Take 10 mg by mouth daily.         Marland Kitchen aspirin EC 81 MG tablet   Oral   Take 81 mg by mouth daily.         Marland Kitchen atorvastatin (LIPITOR) 20 MG tablet   Oral   Take 20 mg by mouth daily.         . clopidogrel (PLAVIX) 75 MG tablet   Oral   Take 75 mg by mouth daily.         . ferrous sulfate 325 (65 FE) MG EC tablet   Oral   Take 325 mg by mouth daily with breakfast.         .  Fluticasone-Salmeterol (ADVAIR) 250-50 MCG/DOSE AEPB   Inhalation   Inhale 1 puff into the lungs every 12 (twelve) hours.         Marland Kitchen HYDROCHLOROTHIAZIDE PO   Oral   Take 12.5 mg by mouth daily.          Marland Kitchen losartan (COZAAR) 25 MG tablet   Oral   Take 25 mg by mouth daily.         . Metoprolol Succinate (TOPROL XL PO)   Oral   Take 150 mg by mouth daily.          . potassium chloride SA (K-DUR,KLOR-CON) 20 MEQ tablet   Oral   Take 20 mEq by mouth 2 (two) times daily.           BP 156/80  Pulse 80  Temp(Src) 98.7 F (37.1 C) (Oral)  SpO2 100%  Physical Exam  Nursing note and vitals reviewed. Constitutional: She is oriented to person, place, and time. She appears well-developed and well-nourished. No distress.  HENT:  Head: Normocephalic.  Mouth/Throat: Oropharynx is clear and moist.  Eyes:  Conjunctivae and EOM are normal. Pupils are equal, round, and reactive to light.  Neck: Normal range of motion. Neck supple. No JVD present.  Cardiovascular: Normal rate, regular rhythm and intact distal pulses.   Pulmonary/Chest: Effort normal and breath sounds normal. No stridor. No respiratory distress. She has no wheezes. She has no rales. She exhibits no tenderness.  CP non-reproducible  Abdominal: Soft. Bowel sounds are normal. She exhibits no distension and no mass. There is no tenderness. There is no rebound and no guarding.  Musculoskeletal: Normal range of motion. She exhibits no edema.  Neurological: She is alert and oriented to person, place, and time.  Psychiatric: She has a normal mood and affect.    ED Course  Procedures (including critical care time)  Labs Reviewed  CBC - Abnormal; Notable for the following:    Hemoglobin 11.9 (*)    RDW 16.2 (*)    All other components within normal limits  COMPREHENSIVE METABOLIC PANEL - Abnormal; Notable for the following:    Albumin 3.2 (*)    GFR calc non Af Amer 53 (*)    GFR calc Af Amer 61 (*)    All other components within normal limits  POCT I-STAT TROPONIN I   Dg Chest 2 View  06/02/2012  *RADIOLOGY REPORT*  Clinical Data: Chest pain.  CHEST - 2 VIEW  Comparison: PA and lateral chest 09/26/2011 and CT chest 09/19/2011.  Findings: There is mild scar or atelectasis in the right middle lobe.  Lungs otherwise clear.  Heart size normal.  No pneumothorax or pleural effusion.  IMPRESSION: No acute disease.   Original Report Authenticated By: Orlean Patten, M.D.      Date: 06/02/2012  Rate: 76  Rhythm: normal sinus rhythm  QRS Axis: normal  Intervals: normal  ST/T Wave abnormalities: normal  Conduction Disutrbances:none  Narrative Interpretation:   Old EKG Reviewed: unchanged  6:38 PM patient seen and examined at the bedside. She is resting comfortably. She states that her pain is moderately relieved with 3 sublingual  nitroglycerin to 3/10.  1. Chest pain   2. SOB (shortness of breath)       MDM   Kayla Mcclure is a 67 y.o. female with prior MI complaining of atypical chest pain onset today. EKG is nonischemic, blood work is unremarkable there is no anemia also troponin is negative. Chest x-ray is unremarkable.  I have a low suspicion for cardiac origin however patient has poor outpatient followup. [facility] cardiology will be consulted.  Consult from [facility] cardiologist Dr. Ellyn Hack appreciated: He does not feel at this is likely secondary to coronary issues however he will put the patient on the list for cardiology to evaluate in the a.m.  Internal medicine consult from teaching service Dr Wilhelmina Mcardle Appreciated: patient will be admitted for low-risk rule out to her service.  Monico Blitz, PA-C 06/02/12 2007

## 2012-06-02 NOTE — H&P (Signed)
Hospital Admission Note Date: 06/02/2012  Patient name: Kayla Mcclure Medical record number: [id number] Date of birth: Jun 01, 1945 Age: 67 y.o. Gender: female PCP: Dorna Mai, MD  Medical Service: Internal Medicine teaching Service   Attending physician:  Dr. Daryll Drown    1st Contact: Dr. Alice Rieger   Pager: [phone] 2nd Contact: Dr. Blaine Hamper    Pager: [phone] After 5 pm or weekends: 1st Contact:      Pager: [phone] 2nd Contact:      Pager: [phone]  Chief Complaint: Chest pain  History of Present Illness: 67 y.o. female with PMH HTN, CAD with MI and RCA stenting in '09, and current ~1ppd smoker presents to the ED with left sided chest pain. She c/o substernal chest pain radiating to and from her back, starting approximally 6 hours prior to presenting to the ED. The pain began in her back 2-3 days ago and then moved to the left back, then the left axilla and left breast, and then today to the left chest and mid-sternum. Her pain was exacerbated when she bent down today, and described it as a more pressure-type pain. She describes the pain as sharp, 8/10 at times, but denies pain after receiving IV Morphine in the ED. She was seen at urgent care prior to coming to the ED and was given aspirin 325 and sublingual nitroglycerin which did not improve the pain. She states that this pain is somewhat different from her prior MI in the duration but not in character. The pain is associated with nausea and GI upset. She states that her entire family, including herself, had a 24hr GI bug which began on Friday. She denies emesis, diaphoresis,shortness of breath, fever, syncope, recent immobilization, or peripheral edema.   Meds: Current Outpatient Rx  Name  Route  Sig  Dispense  Refill  . albuterol (PROVENTIL HFA;VENTOLIN HFA) 108 (90 BASE) MCG/ACT inhaler   Inhalation   Inhale 2 puffs into the lungs every 4 (four) hours as needed. For shortness of pain         . amLODipine (NORVASC) 10 MG tablet   Oral  Take 10 mg by mouth daily.         Marland Kitchen aspirin EC 81 MG tablet   Oral   Take 81 mg by mouth daily.         Marland Kitchen atorvastatin (LIPITOR) 20 MG tablet   Oral   Take 20 mg by mouth daily.         . clopidogrel (PLAVIX) 75 MG tablet   Oral   Take 75 mg by mouth daily.         . ferrous sulfate 325 (65 FE) MG EC tablet   Oral   Take 325 mg by mouth daily with breakfast.         . Fluticasone-Salmeterol (ADVAIR) 250-50 MCG/DOSE AEPB   Inhalation   Inhale 1 puff into the lungs every 12 (twelve) hours.         Marland Kitchen HYDROCHLOROTHIAZIDE PO   Oral   Take 12.5 mg by mouth daily.          Marland Kitchen losartan (COZAAR) 25 MG tablet   Oral   Take 25 mg by mouth daily.         . Metoprolol Succinate (TOPROL XL PO)   Oral   Take 150 mg by mouth daily.          . potassium chloride SA (K-DUR,KLOR-CON) 20 MEQ tablet   Oral   Take 20 mEq by mouth  2 (two) times daily.           Allergies: Allergies as of 06/02/2012  . (No Known Allergies)   Past Medical History  Diagnosis Date  . Asthma   . Hypertension   . MI (myocardial infarction)    Past Surgical History  Procedure Laterality Date  . Cardiac catheterization     History reviewed. No pertinent family history. History   Social History  . Marital Status: Married    Spouse Name: N/A    Number of Children: N/A  . Years of Education: N/A   Occupational History  . Not on file.   Social History Main Topics  . Smoking status: Current Every Day Smoker -- 1.00 packs/day    Types: Cigarettes  . Smokeless tobacco: Not on file  . Alcohol Use: Yes     Comment: social  . Drug Use: Not on file  . Sexually Active: Yes    Birth Control/ Protection: None   Other Topics Concern  . Not on file   Social History Narrative  . No narrative on file    Review of Systems: A 10 point ROS was performed; pertinent positives and negatives were noted in the HPI  Physical Exam: Blood pressure 125/67, pulse 70, temperature 98.6 F  (37 C), temperature source Oral, resp. rate 19, SpO2 100.00%. General: Alert, well-developed, and cooperative on examination.  Head: Normocephalic and atraumatic.  Eyes: Vision grossly intact, pupils equal, round, and reactive to light, no injection and anicteric.  Neck: Supple, full ROM Lungs: CTAB, normal respiratory effort, no accessory muscle use, no crackles, and no wheezes. Heart: Regular rate, regular rhythm, no murmur, no gallop, and no rub.  Abdomen: Soft, TTP in mid-epigastrium, non-distended, normal bowel sounds, no guarding, no rebound tenderness, no organomegaly.  Msk: No joint swelling, warmth, or erythema.  Extremities: 2+ pulses bilaterally. No cyanosis, clubbing, edema Neurologic: Alert & oriented X3, non-focal Skin: Turgor normal and no rashes. Chest pain not reproducible. Psych: Memory intact for recent and remote, normally interactive, good eye contact.   Lab results: Basic Metabolic Panel:  Recent Labs  06/02/12 1733  NA 137  K 3.7  CL 102  CO2 26  GLUCOSE 85  BUN 14  CREATININE 1.08  CALCIUM 9.3   Liver Function Tests:  Recent Labs  06/02/12 1733  AST 14  ALT 9  ALKPHOS 65  BILITOT 0.3  PROT 7.5  ALBUMIN 3.2*   CBC:  Recent Labs  06/02/12 1733  WBC 8.3  HGB 11.9*  HCT 36.4  MCV 79.5  PLT 197   Cardiac Enzymes:  Recent Labs  06/02/12 1948  TROPONINI <0.30    Imaging results:  Dg Chest 2 View  06/02/2012  *RADIOLOGY REPORT*  Clinical Data: Chest pain.  CHEST - 2 VIEW  Comparison: PA and lateral chest 09/26/2011 and CT chest 09/19/2011.  Findings: There is mild scar or atelectasis in the right middle lobe.  Lungs otherwise clear.  Heart size normal.  No pneumothorax or pleural effusion.  IMPRESSION: No acute disease.   Original Report Authenticated By: Orlean Patten, M.D.     Other results: EKG: normal EKG, normal sinus rhythm, unchanged from previous tracings.  Assessment & Plan by Problem: 67 y.o. female with PMH HTN, CAD  with MI and RCA stenting in '09, and current ~1ppd smoker presents to the ED with left sided chest pain.   1. Chest pain:  Pain present for 2-3 days, initially beginning in her left back, and now  substernal and in left chest with radiation to and from her back. Troponin negative x1, EKG normal sinus rhythm and unchanged from previous in June '13. Pain not relieved by ASA and Nitroglycerin at urgent Care, but was improved with Morphine. Dr. Ellyn Hack with [facility] was consulted in the ED and did not feel that this was ACS. He is to evaluate the pt in the morning. She does have a recent h/o of gastroenteritis, and is TTP in her mid-epigastrium. She does endorse a remote h/o reflux. At this time her sx seem more GI related than Cardiac.  - Admit to IMTS - Trending Troponin q6h x3 - AM EKG - F/u with Cardiology, appreciate recommendations - Protonix 40 IV daily - Carafate TID - Consider GI consult if symptoms worsen  2. HTN: She is on a number of blood pressure medications at home, and endorses compliance with all of her meds. On arrival to the ED, her pressures were significantly elevated, but have greatly improved. Will resume her home medications.  - Amlodipine 10mg  daily - HCTZ 12.5mg  daily - Losartan 25mg  daily - Toprol-XL 150mg  daily  3. CAD: H/o MI in '09 with RCA stenting. On Plavix and ASA at home. Her Cardiologist, Dr. Melvern Banker retired around 3ys ago, and she has not been followed by a Film/video editor since. Previous episodes of chest pain; last in the ED 09/2011, and her pain was thought to be musculoskeletal. Cardiology on board and will see the pt in the morning. Resuming home meds. - ASA 81mg  daily - Plavix 75mg  daily - F/u with Cards  4. Asthma: Pt taking Advair diskus and Albuterol, both as needed. Will resume the Albuterol PRN, but will hold the Advair as she is not taking it regularly. - Albuterol inhaler 2p q4-6h PRN  5. DVT PPx: Heparin  Dispo: Disposition is deferred at this  time, awaiting improvement of current medical problems. Anticipated discharge in approximately 1-2 day(s).   The patient does not have a current PCP Dorna Mai, MD), therefore will possibly not be requiring OPC follow-up after discharge.   The patient does not have transportation limitations that hinder transportation to clinic appointments.  Signed: Otho Bellows 06/02/2012, 10:03 PM

## 2012-06-02 NOTE — ED Notes (Signed)
Pt  Reports    Chest  /  Back  Pain     -  She reports  The  Back  Pain  For a  Few  Days     She reports    Chest  Pain  Started  Friday   She  Reports  A  History  Of  Cardiac  Stints     And  Heart  Disease       She   Rates  Her  Pain as  A  9  At this  Time    -   She  Ambulated  To room   With a  Steady  Fluid  Gait  Her  Skin is  Warm  And  Dry  She  Is  Alert  And  Oriented

## 2012-06-03 ENCOUNTER — Other Ambulatory Visit: Payer: Self-pay | Admitting: Cardiology

## 2012-06-03 ENCOUNTER — Encounter (HOSPITAL_COMMUNITY): Payer: Self-pay | Admitting: *Deleted

## 2012-06-03 DIAGNOSIS — R0789 Other chest pain: Secondary | ICD-10-CM

## 2012-06-03 LAB — CBC
MCH: 25.3 pg — ABNORMAL LOW (ref 26.0–34.0)
MCV: 80.2 fL (ref 78.0–100.0)
Platelets: 182 10*3/uL (ref 150–400)
RBC: 4.39 MIL/uL (ref 3.87–5.11)
RDW: 16.4 % — ABNORMAL HIGH (ref 11.5–15.5)

## 2012-06-03 LAB — TROPONIN I
Troponin I: <0.3 ng/mL (ref <0.30)
Troponin I: <0.3 ng/mL (ref <0.30)
Troponin I: <0.3 ng/mL (ref <0.30)

## 2012-06-03 LAB — COMPREHENSIVE METABOLIC PANEL
AST: 19 U/L (ref 0–37)
CO2: 24 mEq/L (ref 19–32)
Calcium: 8.6 mg/dL (ref 8.4–10.5)
Creatinine, Ser: 1.02 mg/dL (ref 0.50–1.10)
GFR calc non Af Amer: 56 mL/min — ABNORMAL LOW (ref >90)

## 2012-06-03 NOTE — Consult Note (Signed)
I have seen and evaluated the patient this PM along with Ellen Henri, PA. I agree with her findings, examination as well as impression recommendations.  Very pleasant 67 y/o AAF with h/o CAD- RCA PCI in 2009 (DES) by Dr. Melvern Banker -- no f/u after retirement.   Has been doing well since - but for the alst week or so has had Sx of epigastric-low center chest discomfort (with a bloating feeling - SOB bending over) that got better with walking.  She then noted some sharp pain radiating from the L mid-axillary line to the center of the chest -- also at rest & better with walking.  No Sx of dyspnea @ rest or exertion, no PND/othopnea.  No palpitations, syncope, near-syncope.  No melena, heamtochezia or hematuria.  Exam & ECG benign.  Impression:  Relatively atypical constellation of symptoms for possible cardiac etiology -- I suspect her epigastric Sx were indigestion/GERD related & the midaxillary pain is a different Sx altogether & more MSK in nature.    At this point, she has r/o for MI.  ECG normal.  No further Sx after being given Morphine. I feel that she is fine for d/c home from a cardiac standpoint -- we will arrange for an OP Treadmill Cardiolite ST @ [facility].  She can f/u with either myself or one of our Advanced Practitioners after the [facility].  Her BP & HR are stable -- no need to adjust.    Leonie Man, M.D., M.S. [facility] [place]. [place], [place]  [address]  [phone] Pager # [phone] 06/03/2012 5:12 PM

## 2012-06-03 NOTE — Consult Note (Signed)
Reason for Consult: Chest Pain  Referring Physician: [facility]  Kayla Mcclure is an 67 y.o. female.  HPI: The patient is a 67 y/o AAF with known CAD. She suffered an MI in 2009 and underwent PCI and stenting to her RCA w/ a DES. Her history is also significant for HTN, HLD and tobacco abuse (smokes 1ppd). She is a former patient of Dr. Melvern Banker and has not been followed by a cardiologist since he retired 3 years ago. She presented to the [facility] ER yesterday after 3 days of intermittent chest pain. She reports first noticing the chest pain on Friday while playing with her grandson. The pain was substernal and pressure-like. This pain was different from the angina she experienced with her MI in 2009, which was a sharp pain. The pain was intermittent. The episodes lasted roughly 5 mins each. The pain was non-positional, non-exertional, non-pleuritic. She also noted feeling SOB during episodes, while bending down/leaning forward. She continued to have intermittent pain on Saturday and Sunday. This pain, however was under her left arm/breast, which then radiated inward towards her sternum. She also endorses radiation to her back. This pain was more sharp, stabbing like pain. She reports nausea but no vomiting. No diaphoresis, palpations, syncope/presyncope. At it's worse, the pain has been 8/10. No aggravating/relieving factors. She took ibuprofen at home with no relief. She initially went to urgent care yesterday and was told to come to the ER. Her troponin's are negative x 3. Serial EKG's have been normal with no acute changes. CXR was also normal. She reports that she had been given SL NTG x 3 and morphine, and since then has had no further chest pain. She reports compliance with her home medications, which include ASA + Plavix.  Past Medical History  Diagnosis Date  . Asthma   . Hypertension   . MI (myocardial infarction)   . Smoker   . CAD (coronary artery disease)     Past Surgical History   Procedure Laterality Date  . Cardiac catheterization      History reviewed. No pertinent family history.  Social History:  reports that she has been smoking Cigarettes.  She has been smoking about 1.00 pack per day. She does not have any smokeless tobacco history on file. She reports that  drinks alcohol. Her drug history is not on file.  Allergies: No Known Allergies  Medications:  Prior to Admission:  Prescriptions prior to admission  Medication Sig Dispense Refill  . albuterol (PROVENTIL HFA;VENTOLIN HFA) 108 (90 BASE) MCG/ACT inhaler Inhale 2 puffs into the lungs every 4 (four) hours as needed. For shortness of pain      . amLODipine (NORVASC) 10 MG tablet Take 10 mg by mouth daily.      Marland Kitchen aspirin EC 81 MG tablet Take 81 mg by mouth daily.      Marland Kitchen atorvastatin (LIPITOR) 20 MG tablet Take 20 mg by mouth daily.      . clopidogrel (PLAVIX) 75 MG tablet Take 75 mg by mouth daily.      . ferrous sulfate 325 (65 FE) MG EC tablet Take 325 mg by mouth daily with breakfast.      . Fluticasone-Salmeterol (ADVAIR) 250-50 MCG/DOSE AEPB Inhale 1 puff into the lungs every 12 (twelve) hours.      Marland Kitchen HYDROCHLOROTHIAZIDE PO Take 12.5 mg by mouth daily.       Marland Kitchen losartan (COZAAR) 25 MG tablet Take 25 mg by mouth daily.      Marland Kitchen  Metoprolol Succinate (TOPROL XL PO) Take 150 mg by mouth daily.       . potassium chloride SA (K-DUR,KLOR-CON) 20 MEQ tablet Take 20 mEq by mouth 2 (two) times daily.        Results for orders placed during the hospital encounter of 06/02/12 (from the past 48 hour(s))  CBC     Status: Abnormal   Collection Time    06/02/12  5:33 PM      Result Value Range   WBC 8.3  4.0 - 10.5 K/uL   RBC 4.58  3.87 - 5.11 MIL/uL   Hemoglobin 11.9 (*) 12.0 - 15.0 g/dL   HCT 36.4  36.0 - 46.0 %   MCV 79.5  78.0 - 100.0 fL   MCH 26.0  26.0 - 34.0 pg   MCHC 32.7  30.0 - 36.0 g/dL   RDW 16.2 (*) 11.5 - 15.5 %   Platelets 197  150 - 400 K/uL  COMPREHENSIVE METABOLIC PANEL     Status:  Abnormal   Collection Time    06/02/12  5:33 PM      Result Value Range   Sodium 137  135 - 145 mEq/L   Potassium 3.7  3.5 - 5.1 mEq/L   Chloride 102  96 - 112 mEq/L   CO2 26  19 - 32 mEq/L   Glucose, Bld 85  70 - 99 mg/dL   BUN 14  6 - 23 mg/dL   Creatinine, Ser 1.08  0.50 - 1.10 mg/dL   Calcium 9.3  8.4 - 10.5 mg/dL   Total Protein 7.5  6.0 - 8.3 g/dL   Albumin 3.2 (*) 3.5 - 5.2 g/dL   AST 14  0 - 37 U/L   ALT 9  0 - 35 U/L   Alkaline Phosphatase 65  39 - 117 U/L   Total Bilirubin 0.3  0.3 - 1.2 mg/dL   GFR calc non Af Amer 53 (*) >90 mL/min   GFR calc Af Amer 61 (*) >90 mL/min   Comment:            The eGFR has been calculated     using the CKD EPI equation.     This calculation has not been     validated in all clinical     situations.     eGFR's persistently     <90 mL/min signify     possible Chronic Kidney Disease.  POCT I-STAT TROPONIN I     Status: None   Collection Time    06/02/12  6:02 PM      Result Value Range   Troponin i, poc 0.02  0.00 - 0.08 ng/mL   Comment 3            Comment: Due to the release kinetics of cTnI,     a negative result within the first hours     of the onset of symptoms does not rule out     myocardial infarction with certainty.     If myocardial infarction is still suspected,     repeat the test at appropriate intervals.  TROPONIN I     Status: None   Collection Time    06/02/12  7:48 PM      Result Value Range   Troponin I <0.30  <0.30 ng/mL   Comment:            Due to the release kinetics of cTnI,     a negative result within the first  hours     of the onset of symptoms does not rule out     myocardial infarction with certainty.     If myocardial infarction is still suspected,     repeat the test at appropriate intervals.  POCT I-STAT TROPONIN I     Status: None   Collection Time    06/02/12  9:59 PM      Result Value Range   Troponin i, poc 0.00  0.00 - 0.08 ng/mL   Comment 3            Comment: Due to the release  kinetics of cTnI,     a negative result within the first hours     of the onset of symptoms does not rule out     myocardial infarction with certainty.     If myocardial infarction is still suspected,     repeat the test at appropriate intervals.  TROPONIN I     Status: None   Collection Time    06/02/12 11:46 PM      Result Value Range   Troponin I <0.30  <0.30 ng/mL   Comment:            Due to the release kinetics of cTnI,     a negative result within the first hours     of the onset of symptoms does not rule out     myocardial infarction with certainty.     If myocardial infarction is still suspected,     repeat the test at appropriate intervals.  CBC     Status: Abnormal   Collection Time    06/03/12  5:40 AM      Result Value Range   WBC 6.6  4.0 - 10.5 K/uL   RBC 4.39  3.87 - 5.11 MIL/uL   Hemoglobin 11.1 (*) 12.0 - 15.0 g/dL   HCT 35.2 (*) 36.0 - 46.0 %   MCV 80.2  78.0 - 100.0 fL   MCH 25.3 (*) 26.0 - 34.0 pg   MCHC 31.5  30.0 - 36.0 g/dL   RDW 16.4 (*) 11.5 - 15.5 %   Platelets 182  150 - 400 K/uL  COMPREHENSIVE METABOLIC PANEL     Status: Abnormal   Collection Time    06/03/12  5:40 AM      Result Value Range   Sodium 140  135 - 145 mEq/L   Potassium 4.0  3.5 - 5.1 mEq/L   Chloride 107  96 - 112 mEq/L   CO2 24  19 - 32 mEq/L   Glucose, Bld 90  70 - 99 mg/dL   BUN 14  6 - 23 mg/dL   Creatinine, Ser 1.02  0.50 - 1.10 mg/dL   Calcium 8.6  8.4 - 10.5 mg/dL   Total Protein 6.8  6.0 - 8.3 g/dL   Albumin 2.8 (*) 3.5 - 5.2 g/dL   AST 19  0 - 37 U/L   ALT 12  0 - 35 U/L   Alkaline Phosphatase 65  39 - 117 U/L   Total Bilirubin 0.2 (*) 0.3 - 1.2 mg/dL   GFR calc non Af Amer 56 (*) >90 mL/min   GFR calc Af Amer 65 (*) >90 mL/min   Comment:            The eGFR has been calculated     using the CKD EPI equation.     This calculation has not been     validated in all  clinical     situations.     eGFR's persistently     <90 mL/min signify     possible Chronic  Kidney Disease.  TROPONIN I     Status: None   Collection Time    06/03/12  5:40 AM      Result Value Range   Troponin I <0.30  <0.30 ng/mL   Comment:            Due to the release kinetics of cTnI,     a negative result within the first hours     of the onset of symptoms does not rule out     myocardial infarction with certainty.     If myocardial infarction is still suspected,     repeat the test at appropriate intervals.  TROPONIN I     Status: None   Collection Time    06/03/12 11:01 AM      Result Value Range   Troponin I <0.30  <0.30 ng/mL   Comment:            Due to the release kinetics of cTnI,     a negative result within the first hours     of the onset of symptoms does not rule out     myocardial infarction with certainty.     If myocardial infarction is still suspected,     repeat the test at appropriate intervals.    Dg Chest 2 View  06/02/2012  *RADIOLOGY REPORT*  Clinical Data: Chest pain.  CHEST - 2 VIEW  Comparison: PA and lateral chest 09/26/2011 and CT chest 09/19/2011.  Findings: There is mild scar or atelectasis in the right middle lobe.  Lungs otherwise clear.  Heart size normal.  No pneumothorax or pleural effusion.  IMPRESSION: No acute disease.   Original Report Authenticated By: Orlean Patten, M.D.     Review of Systems  Constitutional: Negative for fever, chills, malaise/fatigue and diaphoresis.  HENT: Negative for congestion, sore throat and neck pain.   Respiratory: Positive for shortness of breath. Negative for cough and wheezing.   Cardiovascular: Positive for chest pain and orthopnea. Negative for palpitations, claudication, leg swelling and PND.  Gastrointestinal: Positive for nausea and abdominal pain. Negative for vomiting, blood in stool and melena.  Genitourinary: Negative for hematuria.  Musculoskeletal: Positive for back pain.  Neurological: Negative for dizziness, loss of consciousness and weakness.   Blood pressure 130/72, pulse 68,  temperature 98 F (36.7 C), temperature source Oral, resp. rate 18, height 4\' 11"  (1.499 m), weight 162 lb 0.6 oz (73.5 kg), SpO2 98.00%. Physical Exam  Constitutional: She is oriented to person, place, and time. She appears well-developed and well-nourished. No distress.  HENT:  Head: Normocephalic and atraumatic.  Eyes: Conjunctivae and EOM are normal. Pupils are equal, round, and reactive to light.  Neck: Normal range of motion. Neck supple. No JVD present.  Cardiovascular: Normal rate, regular rhythm, normal heart sounds and intact distal pulses.  Exam reveals no gallop and no friction rub.   No murmur heard. Respiratory: Effort normal and breath sounds normal. No stridor. No respiratory distress. She has no wheezes. She has no rales.  GI: Soft. Bowel sounds are normal. She exhibits no distension and no mass. There is no tenderness.  Musculoskeletal: She exhibits edema.  Lymphadenopathy:    She has no cervical adenopathy.  Neurological: She is alert and oriented to person, place, and time.  Skin: Skin is warm and dry. She is not diaphoretic.  Psychiatric: She has a normal mood and affect. Her behavior is normal.    Assessment/Plan: Principal Problem:   Chest pain Active Problems:   TOBACCO ABUSE   ESSENTIAL HYPERTENSION   CAD - MI in 2009 S/P PCI & stenting to RCA w/ DES EF of 60-70%   ASTHMA  Plan: She is currently CP free. She ruled out for MI with negative troponins x 3. Serial EKGs have been normal and have shown no acute changes. CXR was normal.  Considering his past history of MI, CAD and tobacco abuse, may need to consider NST, possibly as an OP.  She is on ASA, Plavix, a BB, ARB and statin. She will need to be assigned a new cardiologist as an outpatient. MD to follow with further recommendation.    SIMMONS, BRITTAINY 06/03/2012, 3:59 PM

## 2012-06-03 NOTE — Care Management Note (Unsigned)
    Page 1 of 1   06/03/2012     4:20:14 PM   CARE MANAGEMENT NOTE 06/03/2012  Patient:  Kayla Mcclure, Kayla Mcclure   Account Number:  [id number]  Date Initiated:  06/03/2012  Documentation initiated by:  AMERSON,JULIE  Subjective/Objective Assessment:   PT ADM ON 06/02/12 WITH CHEST PAIN.  PTA, PT RESIDES AT [facility].     Action/Plan:   WILL FOLLOW FOR HOME NEEDS AS PT PROGRESSES.   Anticipated DC Date:  06/04/2012   Anticipated DC Plan:  [place]  CM consult      Choice offered to / List presented to:             Status of service:  In process, will continue to follow Medicare Important Message given?   (If response is "NO", the following Medicare IM given date fields will be blank) Date Medicare IM given:   Date Additional Medicare IM given:    Discharge Disposition:    Per UR Regulation:  Reviewed for med. necessity/level of care/duration of stay  If discussed at [facility] of Stay Meetings, dates discussed:    Comments:

## 2012-06-03 NOTE — Clinical Documentation Improvement (Signed)
CHEST PAIN DOCUMENTATION CLARIFICATION QUERY  THIS DOCUMENT IS NOT A PERMANENT PART OF THE MEDICAL RECORD  TO RESPOND TO THE THIS QUERY, FOLLOW THE INSTRUCTIONS BELOW:  1. If needed, update documentation for the patient's encounter via the notes activity.  2. Access this query again and click edit on the [employer].  3. After updating, or not, click F2 to complete all highlighted (required) fields concerning your review. Select "additional documentation in the medical record" OR "no additional documentation provided".  4. Click Sign note button.  5. The deficiency will fall out of your In Basket *Please let us know if you are not able to complete this workflow by phone or e-mail (listed below).          06/03/12  Dear Dr. Cathren Laine Rolley Sims  In an effort to better capture your patient's severity of illness, reflect appropriate length of stay and utilization of resources, a review of the patient medical record has revealed the following indicators.    Based on your clinical judgment, please clarify and document in a progress note and/or discharge summary the clinical condition associated with the following supporting information:  In responding to this query please exercise your independent judgment.  The fact that a query is asked, does not imply that any particular answer is desired or expected.  Possible Clinical Conditions?   Based on your clinical judgment, please clarify cause of Chest Pain:  _______Unstable Angina  _______Gastroenteritis _______Musculoskeletal Chest Pain _______Pleuritic Chest Pain _______GERD _______Dressler's syndrome _______Chest wall pain _______Other Condition _______Cannot Clinically Determine    Supporting Information:  Signs & Symptoms: Patient with recent gastroenteritis, TTP in her mid-epigastrium, remote history of reflux noted per 3/03 progress notes. Symptoms seem more GI related than cardiac noted per 3/03 progress  notes.   Treatment: Protonix 40mg  IV daily and Carafate po TID noted per 3/03 progress notes.   You may use possible, probable, or suspect with inpatient documentation. possible, probable, suspected diagnoses MUST be documented at the time of discharge  Reviewed: additional documentation in the medical record  Thank You,  Jeannetta Ellis, RN,BSN,  Clinical Documentation Specialist:  Pager: [phone]  Phone: [place]

## 2012-06-03 NOTE — Discharge Summary (Signed)
Internal [facility] Discharge Note  Name: Kayla Mcclure MRN: [id number] DOB: 1945-07-05 67 y.o.  Date of Admission: 06/02/2012  4:58 PM Date of Discharge: 06/03/2012 Attending Physician: Janell Quiet, MD  Discharge Diagnosis: Principal Problem:   Chest pain Active Problems:   TOBACCO ABUSE   ESSENTIAL HYPERTENSION   CAD - MI in 2009 S/P PCI & stenting to RCA w/ DES EF of 60-70%   ASTHMA   Discharge Medications:   Medication List    TAKE these medications       albuterol 108 (90 BASE) MCG/ACT inhaler  Commonly known as:  PROVENTIL HFA;VENTOLIN HFA  Inhale 2 puffs into the lungs every 4 (four) hours as needed. For shortness of pain     amLODipine 10 MG tablet  Commonly known as:  NORVASC  Take 10 mg by mouth daily.     aspirin EC 81 MG tablet  Take 81 mg by mouth daily.     atorvastatin 20 MG tablet  Commonly known as:  LIPITOR  Take 20 mg by mouth daily.     clopidogrel 75 MG tablet  Commonly known as:  PLAVIX  Take 75 mg by mouth daily.     ferrous sulfate 325 (65 FE) MG EC tablet  Take 325 mg by mouth daily with breakfast.     Fluticasone-Salmeterol 250-50 MCG/DOSE Aepb  Commonly known as:  ADVAIR  Inhale 1 puff into the lungs every 12 (twelve) hours.     HYDROCHLOROTHIAZIDE PO  Take 12.5 mg by mouth daily.     losartan 25 MG tablet  Commonly known as:  COZAAR  Take 25 mg by mouth daily.     potassium chloride SA 20 MEQ tablet  Commonly known as:  K-DUR,KLOR-CON  Take 20 mEq by mouth 2 (two) times daily.     TOPROL XL PO  Take 150 mg by mouth daily.        Disposition and follow-up:   [name] was discharged from [facility] in stable condition.  At the hospital follow up visit please address  - Please evaluate patient's his symptoms of chest pain.  - Her blood pressure medications might need adjustment - The patient is unlikely to be following closely with her PCP and therefore, she should be  assisted to establish care at internal medicine clinic.  Follow-up Appointments:     Follow-up Information   Follow up with [name], MD On 06/09/2012. (INTERNAL MEDICINE OUTPATIENT CLINIC - Your appointment is on 06/09/2012 at 2:45PM)    Contact information:   Internal medicine center  [facility] Floor  [place] [address] [phone]       Follow up with [place]. ([place] office will call you to arrange stress test and follow-up )    Contact information:   [address] [place] [address] [phone]      Future Appointments Provider Department Dept Phone   06/09/2012 2:45 PM [name], MD [place] [phone]      Consultations:   cardiology  Procedures Performed:  Dg Chest 2 View  06/02/2012  *RADIOLOGY REPORT*  Clinical Data: Chest pain.  CHEST - 2 VIEW  Comparison: PA and lateral chest 09/26/2011 and CT chest 09/19/2011.  Findings: There is mild scar or atelectasis in the right middle lobe.  Lungs otherwise clear.  Heart size normal.  No pneumothorax or pleural  effusion.  IMPRESSION: No acute disease.   Original Report Authenticated By: Orlean Patten, M.D.      Admission HPI:  Chief Complaint: Chest pain  History of Present Illness:  67 y.o. female with PMH HTN, CAD with MI and RCA stenting in '09, and current ~1ppd smoker presents to the ED with left sided chest pain. She c/o substernal chest pain radiating to and from her back, starting approximally 6 hours prior to presenting to the ED. The pain began in her back 2-3 days ago and then moved to the left back, then the left axilla and left breast, and then today to the left chest and mid-sternum. Her pain was exacerbated when she bent down today, and described it as a more pressure-type pain. She describes the pain as sharp, 8/10 at times, but denies pain after receiving IV Morphine in the  ED. She was seen at urgent care prior to coming to the ED and was given aspirin 325 and sublingual nitroglycerin which did not improve the pain. She states that this pain is somewhat different from her prior MI in the duration but not in character. The pain is associated with nausea and GI upset. She states that her entire family, including herself, had a 24hr GI bug which began on Friday. She denies emesis, diaphoresis,shortness of breath, fever, syncope, recent immobilization, or peripheral edema.  Review of Systems:  A 10 point ROS was performed; pertinent positives and negatives were noted in the HPI  Physical Exam:  Blood pressure 125/67, pulse 70, temperature 98.6 F (37 C), temperature source Oral, resp. rate 19, SpO2 100.00%.  General: Alert, well-developed, and cooperative on examination.  Head: Normocephalic and atraumatic.  Eyes: Vision grossly intact, pupils equal, round, and reactive to light, no injection and anicteric.  Neck: Supple, full ROM Lungs: CTAB, normal respiratory effort, no accessory muscle use, no crackles, and no wheezes. Heart: Regular rate, regular rhythm, no murmur, no gallop, and no rub.  Abdomen: Soft, TTP in mid-epigastrium, non-distended, normal bowel sounds, no guarding, no rebound tenderness, no organomegaly.  Msk: No joint swelling, warmth, or erythema.  Extremities: 2+ pulses bilaterally. No cyanosis, clubbing, edema Neurologic: Alert & oriented X3, non-focal Skin: Turgor normal and no rashes. Chest pain not reproducible. Psych: Memory intact for recent and remote, normally interactive, good eye contact.      Hospital Course by problem list:  GERD Vs Acute Coronary Syndrome: [name] presented with chest pain for 2-3 days, initially beginning in her left back, and later substernal and in left chest with radiation to and from her back. Troponin negative x3, EKG normal sinus rhythm and unchanged from previous in June '13. Initially, her pain was not  relieved by ASA and Nitroglycerin at urgent Care, but was improved with Morphine. She does have a recent h/o of gastroenteritis, and is TTP in her mid-epigastrium, which pointed away from this being cardiac chest pain. Cardiology service was consulted and Dr. Ellyn Hack evaluated the patient. He felt that this chest pain was unlikely to be cardiac and most consistent with GERD/indigestion. It is also possible that this patient experienced a musculoskeletal pain even though she has significant risk factors for cardiovascular disease. He recommended outpatient treadmill stress test at [facility] cardiovascular. The patient was discharged after one day in a stable condition. Her symptoms had completely resolved. She will be contacted by Dr. Allison Quarry office for the cardiac stress test. She'll also have an outpatient hospital followup visit at the internal medicine clinic on 06/09/2012.  Hypertension: She is on a number of blood pressure medications at home, and endorses compliance with all of her meds. Her blood pressure in the ED, was initially elevated, but this improved with her home medications, which include amlodipine 10 mg once daily, HCTZ, 12.5 mg once daily, losartan 25 mg once daily, and Toprol extended release 150 mg once daily. Her blood pressure will be evaluated at her outpatient visit and changes in her medications can be made as needed.  Coronary artery disease: Ms. Proske had an acute MI in '09 with RCA , which required stenting. In the community, she is Plavix and ASA. Her Cardiologist, Dr. Melvern Banker retired in 2011, and she has not been followed by a Film/video editor since. She reported previous episodes of chest pain; last in the ED 09/2011, and her pain was thought to be musculoskeletal. As already noted, this history places, at an increased risk of recurrent acute coronary syndrome. She'll be evaluated with a stress test as outpatient. She was discharged on aspirin 80 mg once daily, Plavix, 75 mg  once daily. She was also encouraged to establish care with [facility] cardiology for outpatient care.  Asthma: She takes Advair diskus and Albuterol, both as needed, and her symptoms remained stable. During her admission, no changes were made in her medications, and she was discharged on the same.    Discharge Vitals:  BP 130/72  Pulse 68  Temp(Src) 98 F (36.7 C) (Oral)  Resp 18  Ht 4\' 11"  (1.499 m)  Wt 162 lb 0.6 oz (73.5 kg)  BMI 32.71 kg/m2  SpO2 98%  Discharge Labs:  Results for orders placed during the hospital encounter of 06/02/12 (from the past 24 hour(s))  POCT I-STAT TROPONIN I     Status: None   Collection Time    06/02/12  6:02 PM      Result Value Range   Troponin i, poc 0.02  0.00 - 0.08 ng/mL   Comment 3           TROPONIN I     Status: None   Collection Time    06/02/12  7:48 PM      Result Value Range   Troponin I <0.30  <0.30 ng/mL  POCT I-STAT TROPONIN I     Status: None   Collection Time    06/02/12  9:59 PM      Result Value Range   Troponin i, poc 0.00  0.00 - 0.08 ng/mL   Comment 3           TROPONIN I     Status: None   Collection Time    06/02/12 11:46 PM      Result Value Range   Troponin I <0.30  <0.30 ng/mL  CBC     Status: Abnormal   Collection Time    06/03/12  5:40 AM      Result Value Range   WBC 6.6  4.0 - 10.5 K/uL   RBC 4.39  3.87 - 5.11 MIL/uL   Hemoglobin 11.1 (*) 12.0 - 15.0 g/dL   HCT 35.2 (*) 36.0 - 46.0 %   MCV 80.2  78.0 - 100.0 fL   MCH 25.3 (*) 26.0 - 34.0 pg   MCHC 31.5  30.0 - 36.0 g/dL   RDW 16.4 (*) 11.5 - 15.5 %   Platelets 182  150 - 400 K/uL  COMPREHENSIVE METABOLIC PANEL     Status: Abnormal   Collection Time    06/03/12  5:40 AM  Result Value Range   Sodium 140  135 - 145 mEq/L   Potassium 4.0  3.5 - 5.1 mEq/L   Chloride 107  96 - 112 mEq/L   CO2 24  19 - 32 mEq/L   Glucose, Bld 90  70 - 99 mg/dL   BUN 14  6 - 23 mg/dL   Creatinine, Ser 1.02  0.50 - 1.10 mg/dL   Calcium 8.6  8.4 - 10.5 mg/dL    Total Protein 6.8  6.0 - 8.3 g/dL   Albumin 2.8 (*) 3.5 - 5.2 g/dL   AST 19  0 - 37 U/L   ALT 12  0 - 35 U/L   Alkaline Phosphatase 65  39 - 117 U/L   Total Bilirubin 0.2 (*) 0.3 - 1.2 mg/dL   GFR calc non Af Amer 56 (*) >90 mL/min   GFR calc Af Amer 65 (*) >90 mL/min  TROPONIN I     Status: None   Collection Time    06/03/12  5:40 AM      Result Value Range   Troponin I <0.30  <0.30 ng/mL  TROPONIN I     Status: None   Collection Time    06/03/12 11:01 AM      Result Value Range   Troponin I <0.30  <0.30 ng/mL    Signed: Jessee Avers 06/03/2012, 5:37 PM   Time Spent on Discharge: 30 minutes  Services Ordered on Discharge: None Equipment Ordered on Discharge: None

## 2012-06-03 NOTE — H&P (Addendum)
Internal Medicine Attending Admission Note Date: 06/03/2012  Patient name: Kayla Mcclure Medical record number: [id number] Date of birth: 1945-09-29 Age: 67 y.o. Gender: female  I saw and evaluated the patient. I reviewed the resident's note and I agree with the resident's findings and plan as documented in the resident's note.  Chief Complaint(s): Chest pain  History - key components related to admission: Patient is a 67 year old female with past medical history most significant for hypertension, coronary artery disease with drug-eluting stent in right coronary artery in 2009, current smoker who presented to [facility] with complaints of chest pain. Patient is fairly active and does her groceries and lawn mowing by herself. Patient was apparently well up until Sunday and did grocery shopping and mowing without any difficulty. Patient bent down to pick up her grandson when she noticed a tightness in the middle of her chest. The pain was like a tightness or pressure present in the middle of the chest, 8/10 at its worse, exacerbated by doing any movement of the arms and relieved with rest by itself. Patient did not try any medications. There were no associated shortness of breath or sweating. Patient denies any fever, chills, cough. She decided to come to urgent care to get herself checked out there she was given sublingual nitroglycerin and aspirin and was moved to [facility] cone. Patient complains of some nausea and GI upset over last few days and said that she has had many contacts in her family who have had stomach upset since last 4 days.  Patient continues to smoke and has not seen a cardiologist since last 5 years. [facility] but does not have a primary care physician at this time since its closure.  15 point review of systems is otherwise negative.     Physical Exam - key components related to admission:  Filed Vitals:   06/03/12 0003 06/03/12 0041 06/03/12 0418 06/03/12  1115  BP:  163/69 138/69   Pulse:  72 66   Temp:  97.8 F (36.6 C) 97.9 F (36.6 C)   TempSrc:  Oral Oral   Resp:  20 20   Height: 4\' 11"  (1.499 m)     Weight: 158 lb 11.7 oz (72 kg)  162 lb 0.6 oz (73.5 kg)   SpO2:  97% 99% 98%  Physical Exam: General: Vital signs reviewed and noted. Well-developed, well-nourished, in no acute distress; alert, appropriate and cooperative throughout examination.  Head: Normocephalic, atraumatic.  Eyes: PERRL, EOMI, No signs of anemia or jaundince.  Nose: Mucous membranes moist, not inflammed, nonerythematous.  Throat: Oropharynx nonerythematous, no exudate appreciated.   Neck: No deformities, masses, or tenderness noted.Supple, No carotid Bruits, no JVD.  Lungs:  Normal respiratory effort. Clear to auscultation BL without crackles or wheezes.  Heart: RRR. S1 and S2 normal without gallop, murmur, or rubs.  Abdomen:  BS normoactive. Soft, Nondistended, non-tender.  No masses or organomegaly.  Extremities: No pretibial edema.  Neurologic: A&O X3, CN II - XII are grossly intact. Motor strength is 5/5 in the all 4 extremities, Sensations intact to light touch, Cerebellar signs negative.  Skin: No visible rashes, scars.     Lab results:   Basic Metabolic Panel:  Recent Labs  06/02/12 1733 06/03/12 0540  NA 137 140  K 3.7 4.0  CL 102 107  CO2 26 24  GLUCOSE 85 90  BUN 14 14  CREATININE 1.08 1.02  CALCIUM 9.3 8.6   Liver Function Tests:  Recent  Labs  06/02/12 1733 06/03/12 0540  AST 14 19  ALT 9 12  ALKPHOS 65 65  BILITOT 0.3 0.2*  PROT 7.5 6.8  ALBUMIN 3.2* 2.8*   No results found for this basename: LIPASE, AMYLASE,  in the last 72 hours No results found for this basename: AMMONIA,  in the last 72 hours CBC:  Recent Labs  06/02/12 1733 06/03/12 0540  WBC 8.3 6.6  HGB 11.9* 11.1*  HCT 36.4 35.2*  MCV 79.5 80.2  PLT 197 182   Cardiac Enzymes:  Recent Labs  06/02/12 2346 06/03/12 0540 06/03/12 1101  TROPONINI <0.30  <0.30 <0.30   Imaging results:  Dg Chest 2 View  06/02/2012  *RADIOLOGY REPORT*  Clinical Data: Chest pain.  CHEST - 2 VIEW  Comparison: PA and lateral chest 09/26/2011 and CT chest 09/19/2011.  Findings: There is mild scar or atelectasis in the right middle lobe.  Lungs otherwise clear.  Heart size normal.  No pneumothorax or pleural effusion.  IMPRESSION: No acute disease.   Original Report Authenticated By: Orlean Patten, M.D.     Other results: EKG: 73 beats per minute, sinus rhythm, normal axis, nonspecific T wave changes noted in the lateral leads which are unchanged as compared to previous EKG done on 09/27/2011.  Assessment & Plan by Problem:  Principal Problem:   Chest pain likely musculoskeletal Active Problems:   TOBACCO ABUSE   ESSENTIAL HYPERTENSION   CAD   ASTHMA  Patient is a 67 year old female with past medical history most significant for hypertension, coronary artery disease status post MI in 2009 and current smoker who comes in with atypical chest pain. Patient's cardiac enzymes and EKG has been negative so far. Given that patient has history of coronary artery disease and other risk factors, she is a good candidate for stress test. Inpatient versus outpatient to be decided by cardiology at this time. Patient also needs a primary care physician to manage her chronic medical problems and I would set up an appointment with internal medicine clinic given [facility] and patient seems appropriate for the residency clinic. Risk stratification with fasting lipid panel, HbA1c, TSH and maximize medical therapy with aspirin, beta blockers, ACE inhibitors, statins and nitrate as needed upon discharge.  Janell Quiet MD Faculty-Internal Medicine Residency Program Pager [phone]

## 2012-06-03 NOTE — Progress Notes (Signed)
Subjective: The patient reports no symptoms. No chest pain, no shortness of breath, no dizziness. Her chest pain on admission, has completely resolved and she is looking forward to be discharged once cardiology has evaluated her.  Objective: Vital signs in last 24 hours: Filed Vitals:   06/03/12 0041 06/03/12 0418 06/03/12 1115 06/03/12 1329  BP: 163/69 138/69  130/72  Pulse: 72 66  68  Temp: 97.8 F (36.6 C) 97.9 F (36.6 C)  98 F (36.7 C)  TempSrc: Oral Oral  Oral  Resp: 20 20  18   Height:      Weight:  162 lb 0.6 oz (73.5 kg)    SpO2: 97% 99% 98% 98%   Weight change:   Intake/Output Summary (Last 24 hours) at 06/03/12 1738 Last data filed at 06/03/12 1100  Gross per 24 hour  Intake    240 ml  Output    300 ml  Net    -60 ml   Physical Exam:  General:  Vital signs reviewed and noted. Well-developed, well-nourished, in no acute distress; alert, appropriate and cooperative throughout examination.   Head:  Normocephalic, atraumatic.   Eyes:  PERRL, EOMI, No signs of anemia or jaundice.   Nose:  Mucous membranes moist, not inflammed, nonerythematous.   Throat:  Oropharynx nonerythematous, no exudate appreciated.   Neck:  No deformities, masses, or tenderness noted.Supple, No carotid Bruits, no JVD.   Lungs:  Normal respiratory effort. Clear to auscultation BL without crackles or wheezes.   Heart:  RRR. S1 and S2 normal without gallop, murmur, or rubs.   Abdomen:  BS normoactive. Soft, Nondistended, non-tender. No masses or organomegaly.   Extremities:  No pretibial edema.   Neurologic:  A&O X3, CN II - XII are grossly intact. Motor strength is 5/5 in the all 4 extremities, Sensations intact to light touch, Cerebellar signs negative.   Skin:  No visible rashes, scars.     Lab Results: Basic Metabolic Panel:  Recent Labs Lab 06/02/12 1733 06/03/12 0540  NA 137 140  K 3.7 4.0  CL 102 107  CO2 26 24  GLUCOSE 85 90  BUN 14 14  CREATININE 1.08 1.02  CALCIUM 9.3 8.6    Liver Function Tests:  Recent Labs Lab 06/02/12 1733 06/03/12 0540  AST 14 19  ALT 9 12  ALKPHOS 65 65  BILITOT 0.3 0.2*  PROT 7.5 6.8  ALBUMIN 3.2* 2.8*   CBC:  Recent Labs Lab 06/02/12 1733 06/03/12 0540  WBC 8.3 6.6  HGB 11.9* 11.1*  HCT 36.4 35.2*  MCV 79.5 80.2  PLT 197 182   Cardiac Enzymes:  Recent Labs Lab 06/02/12 2346 06/03/12 0540 06/03/12 1101  TROPONINI <0.30 <0.30 <0.30   Studies/Results: Dg Chest 2 View  06/02/2012  *RADIOLOGY REPORT*  Clinical Data: Chest pain.  CHEST - 2 VIEW  Comparison: PA and lateral chest 09/26/2011 and CT chest 09/19/2011.  Findings: There is mild scar or atelectasis in the right middle lobe.  Lungs otherwise clear.  Heart size normal.  No pneumothorax or pleural effusion.  IMPRESSION: No acute disease.   Original Report Authenticated By: Orlean Patten, M.D.    Medications: I have reviewed the patient's current medications. Scheduled Meds: . amLODipine  10 mg Oral Daily  . aspirin EC  81 mg Oral Daily  . atorvastatin  20 mg Oral Daily  . clopidogrel  75 mg Oral Daily  . ferrous sulfate  325 mg Oral Q breakfast  . heparin  5,000 Units Subcutaneous  Q8H  . hydrochlorothiazide  12.5 mg Oral Daily  . losartan  25 mg Oral Daily  . metoprolol succinate  150 mg Oral Daily  . mometasone-formoterol  2 puff Inhalation BID  . pantoprazole (PROTONIX) IV  40 mg Intravenous QHS  . potassium chloride SA  20 mEq Oral BID  . sodium chloride  3 mL Intravenous Q12H  . sucralfate  1 g Oral TID WC & HS   Continuous Infusions: . sodium chloride 125 mL/hr at 06/03/12 0046   PRN Meds:.acetaminophen, acetaminophen, albuterol, alum & mag hydroxide-simeth, morphine injection, ondansetron (ZOFRAN) IV, ondansetron Assessment/Plan:  GERD: Ms. Derks presented with chest pain for 2-3 days, initially beginning in her left back, and later substernal and in left chest with radiation to and from her back. Troponin negative x3, EKG normal sinus  rhythm and unchanged from previous in June '13. Initially, her pain was not relieved by ASA and Nitroglycerin at urgent Care, but was improved with Morphine. She does have a recent h/o of gastroenteritis, and is TTP in her mid-epigastrium, which pointed away from this being cardiac chest pain. Cardiology service was consulted and Dr. Ellyn Hack evaluated the patient. He felt that this chest pain was unlikely to be cardiac and most consistent with GERD/indigestion. It is also possible that this patient experienced a musculoskeletal pain even though she has significant risk factors for cardiovascular disease. He recommended outpatient treadmill stress test at [facility] cardiovascular. The patient was discharged after one day in a stable condition. Her symptoms had completely resolved. She will be contacted by Dr. Allison Quarry office for the cardiac stress test. She'll also have an outpatient hospital followup visit at the internal medicine clinic on 06/09/2012.   Hypertension: She is on a number of blood pressure medications at home, and endorses compliance with all of her meds. Her blood pressure in the ED, was initially elevated, but this improved with her home medications, which include amlodipine 10 mg once daily, HCTZ, 12.5 mg once daily, losartan 25 mg once daily, and Toprol extended release 150 mg once daily. Her blood pressure will be evaluated at her outpatient visit and changes in her medications can be made as needed.  Coronary artery disease: Ms. Rabanal had an acute MI in '09 with RCA , which required stenting. In the community, she is Plavix and ASA. Her Cardiologist, Dr. Melvern Banker retired in 2011, and she has not been followed by a Film/video editor since. She reported previous episodes of chest pain; last in the ED 09/2011, and her pain was thought to be musculoskeletal. As already noted, this history places, at an increased risk of recurrent acute coronary syndrome. She'll be evaluated with a stress test as  outpatient. She was discharged on aspirin 80 mg once daily, Plavix, 75 mg once daily. She was also encouraged to establish care with [facility] cardiology for outpatient care.  Asthma: She takes Advair diskus and Albuterol, both as needed, and her symptoms remained stable. During her admission, no changes were made in her medications, and she was discharged on the same.     Dispo: Disposition is deferred at this time, awaiting improvement of current medical problems.  Anticipated discharge in approximately 0 day(s). She'll be discharged today to follow up at outpatient clinic and cardiology outpatient.  The patient does have a current PCP Dorna Mai, MD), but will be requiring OPC follow-up after discharge for HFU.   The patient does not have transportation limitations that hinder transportation to clinic appointments.  .Services Needed at time of  discharge: Y = Yes, Blank = No PT:   OT:   RN:   Equipment:   Other:     LOS: 1 day   Jessee Avers 06/03/2012, 5:38 PM

## 2012-06-09 ENCOUNTER — Encounter: Payer: Self-pay | Admitting: Internal Medicine

## 2012-06-09 ENCOUNTER — Other Ambulatory Visit (HOSPITAL_COMMUNITY)
Admission: RE | Admit: 2012-06-09 | Discharge: 2012-06-09 | Disposition: A | Discharge status: Home / Self Care | Payer: Medicare Other | Source: Ambulatory Visit | Attending: Internal Medicine | Admitting: Internal Medicine

## 2012-06-09 ENCOUNTER — Ambulatory Visit (INDEPENDENT_AMBULATORY_CARE_PROVIDER_SITE_OTHER): Payer: Medicare Other | Admitting: Internal Medicine

## 2012-06-09 VITALS — BP 144/79 | HR 96 | Temp 98.2°F | Ht 60.0 in | Wt 155.7 lb

## 2012-06-09 DIAGNOSIS — Z124 Encounter for screening for malignant neoplasm of cervix: Secondary | ICD-10-CM

## 2012-06-09 DIAGNOSIS — Z1239 Encounter for other screening for malignant neoplasm of breast: Secondary | ICD-10-CM

## 2012-06-09 DIAGNOSIS — I10 Essential (primary) hypertension: Secondary | ICD-10-CM

## 2012-06-09 DIAGNOSIS — Z23 Encounter for immunization: Secondary | ICD-10-CM

## 2012-06-09 DIAGNOSIS — Z1151 Encounter for screening for human papillomavirus (HPV): Secondary | ICD-10-CM | POA: Insufficient documentation

## 2012-06-09 DIAGNOSIS — Z803 Family history of malignant neoplasm of breast: Secondary | ICD-10-CM

## 2012-06-09 DIAGNOSIS — I2510 Atherosclerotic heart disease of native coronary artery without angina pectoris: Secondary | ICD-10-CM

## 2012-06-09 DIAGNOSIS — F17200 Nicotine dependence, unspecified, uncomplicated: Secondary | ICD-10-CM

## 2012-06-09 MED ORDER — ATORVASTATIN CALCIUM 20 MG PO TABS: 40.0000 mg | ORAL_TABLET | Freq: Every day | ORAL | Status: DC

## 2012-06-09 NOTE — Assessment & Plan Note (Signed)
Assessment:  Progress toward smoking cessation:  smoking the same amount  Barriers to progress toward smoking cessation:  withdrawal symptoms  Plan:  Instruction/counseling given:  I counseled patient on the dangers of tobacco use and advised patient to stop smoking.  Medications to assist with smoking cessation:  none Patient agreed to the following self-care plans for smoking cessation:  call [facility] (1-800-QUIT-NOW)

## 2012-06-09 NOTE — Assessment & Plan Note (Addendum)
Stable. On optimal medical management with losartan, metoprolol, aspirin, and atorvastatin. She may benefit from a high intensity statin therapy with atorvastatin 80 mg. For now, I have instructed her to increase atorvastatin to 40 mg (2 tablets) daily. She is scheduled for a nuclear stress test later this week. - Increase atorvastatin 40 mg daily

## 2012-06-09 NOTE — Assessment & Plan Note (Signed)
BP Readings from Last 3 Encounters:  06/09/12 144/79  06/03/12 130/72  06/02/12 182/90    Lab Results  Component Value Date   NA 140 06/03/2012   K 4.0 06/03/2012   CREATININE 1.02 06/03/2012    Assessment:  Blood pressure control: mildly elevated  Progress toward BP goal:  unchanged  Plan:  Medications: continue amlodipine 10 mg daily, hydrochlorothiazide 12.5 mg daily, losartan 25 mg daily, and metoprolol succinate 150 mg daily  Educational resources provided: handout  Other plans: Provided patient with information about the DASH diet. At next appointment, consider increasing losartan from 25 to 50 mg daily.

## 2012-06-09 NOTE — Progress Notes (Signed)
Subjective:    Patient ID: Kayla Mcclure, female    DOB: 04/07/1945, 67 y.o.   MRN: [id number]  HPI:  This is a 67 year old woman with asthma, daily tobacco smoking, hypertension, coronary artery disease, and stage II chronic kidney disease; who presents to the clinic for hospital followup and to establish care. She was discharged from the hospital by the internal medicine teaching service after an admission for ACS rule out. She had presented with substernal chest pain. Her evaluation revealed no acute ischemia. Since leaving the hospital, she has felt well with no pain or anginal symptoms. She does say that for the past week she has been slightly dizzy and nauseated with position changes. She has not passed out or vomited. Review of systems was otherwise negative except for mild chronic back pain.   Review of Systems  Constitutional: Negative for fever, chills and fatigue.  HENT: Negative for hearing loss.   Eyes: Negative for visual disturbance.  Respiratory: Negative for cough and shortness of breath.   Cardiovascular: Negative for chest pain, palpitations and leg swelling.  Gastrointestinal: Positive for nausea. Negative for vomiting, abdominal pain, diarrhea, constipation and blood in stool.  Genitourinary: Negative for dysuria and hematuria.  Musculoskeletal: Positive for back pain. Negative for arthralgias.  Skin: Negative for rash.  Neurological: Positive for dizziness and light-headedness. Negative for syncope and headaches.  Hematological: Does not bruise/bleed easily.  Psychiatric/Behavioral: Negative for dysphoric mood.    Current Medications: 1. Albuterol MDI 2 puffs every 4 hours as needed 2. Amlodipine 10 mg daily 3. Aspirin 81 mg daily 4. Atorvastatin 20 mg daily 5. Clopidogrel 75 mg daily 6. Ferrous sulfate 325 mg with breakfast 7. Fluticasone-salmeterol 250-50 mcg per dose, one puff every 12 hours 8. Hydrochlorothiazide 12.5 mg daily 9. Losartan 25 mg daily 10.  Metoprolol succinate 150 mg daily 11. Potassium chloride 20 mEq twice a day   Allergies No Known Allergies   Past Medical History  Diagnosis Date  . Hypertension   . MI (myocardial infarction) 08/05/2007  . Smoker   . CAD 08/05/2007    MI 2009 - DES to RCA   . CKD, stage 2 08/15/2008  . Internal hemorrhoids 04/11/2010    Colonoscopy 04/11/2010   . Diverticulosis 04/11/2010    Colonoscopy 04/11/2010   . INSOMNIA, CHRONIC 10/07/2009  . ASTHMA 10/31/2007  . BACK PAIN, RIGHT 09/23/2008    Past Surgical History  Procedure Laterality Date  . Cardiac catheterization  08/05/2007    DES to RCA  . Colonoscopy  04/11/2010  . Cholecystectomy  1975    Family History  Problem Relation Age of Onset  . Diabetes Mother   . Heart disease Mother   . Diabetes Sister   . Heart attack Mother 48  . Heart attack Father 64  . Breast cancer Sister 55  . Breast cancer Mother 21  . Colon cancer Mother 48    Social History  . Marital Status: Married    Spouse Name: N/A    Number of Children: 1  . Years of Education: N/A   Social History Main Topics  . Smoking status: Current Every Day Smoker -- 1.00 packs/day    Types: Cigarettes  . Smokeless tobacco: Not on file  . Alcohol Use: Yes     Comment: social  . Drug Use: No  . Sexually Active: Not Currently -- Female partner(s)    Birth Control/ Protection: None   Social History Narrative   Married. Has one child.  Objective:   Physical Exam:  GENERAL: overweight; no acute distress HEAD: atraumatic, normocephalic EYES: pupils equal, round and reactive; sclera anicteric; normal conjunctiva EARS: canals patent and TMs normal bilaterally NOSE: normal nasal mucosa, no erythema or drainage MOUITH/THROAT: oropharynx clear, moist mucous membranes, pink gingiva NECK: supple, thyroid normal in size and without palpable nodules LYMPH: no cervical or supraclavicular lymphadenopathy LUNGS: diminished lung sounds in the lower fields, no rales  or wheezing, normal work of breathing HEART: normal rate and regular rhythm; normal S1 and S2 without S3 or S4; no murmurs, rubs, or clicks PULSES: radial 2+ and symmetric ABDOMEN: soft, non-tender, normal bowel sounds GU: normal external genitalia; normal-appearing vaginal vault and cervix; endocervical & exocervical Papanicolaou sample obtained; bimanual exam without abnormality SKIN: warm, dry, intact, normal turgor, scattered ecchymoses noted on abdomen from heparin injections, no other rashes EXTREMITIES: no peripheral edema, clubbing, or cyanosis PSYCH: patient is alert and oriented, mood and affect are normal and congruent, thought content is normal without delusions, thought process is linear, speech is normal and non-pressured, behavior is normal, judgement and insight are normal   Filed Vitals:   06/09/12 1452  BP: 144/79  Pulse: 96  Temp: 98.2 F (36.8 C)    BP Readings from Last 3 Encounters:  06/09/12 144/79  06/03/12 130/72  06/02/12 182/90        Assessment & Plan:    Cancer screening:  With a history of 2 first degree relatives with breast cancer, I think this patient would benefit from annual mammography rather than every other year. Since these relatives were both in their 110s, I don't think she needs genetic testing at this point; there is no history of ovarian cancer in the family. We are [facility] records from [facility], regarding her colonoscopy last year. Pelvic exam with Papanicolaou smear was performed today.  Vaccination:  She was vaccinated against influenza and pneumococcal today.

## 2012-06-09 NOTE — Patient Instructions (Addendum)
General Instructions:  1. Start taking 2 tablets of your cholesterol medicine ATORVASTATIN each day.  2. Pap smear, pneumonia shot, and flu shot today.  3. You will be due for your mammogram in April.  We will help you set this up.     Treatment Goals:  Goals (1 Years of Data) as of 06/09/12         As of Today 06/03/12 06/03/12 06/03/12 06/02/12     Blood Pressure    . Blood Pressure < 140/90  144/79 130/72 138/69 163/69 144/70     Lifestyle    . Quit smoking / using tobacco  No         Result Component    . LDL CALC < 100            Progress Toward Treatment Goals:  Treatment Goal 06/09/2012  Blood pressure unchanged  Stop smoking smoking the same amount    Self Care Goals & Plans:  Self Care Goal 06/09/2012  Manage my medications take my medicines as prescribed; refill my medications on time  Monitor my health keep track of my blood pressure  Eat healthy foods eat foods that are low in salt; eat smaller portions  Be physically active take a walk every day  Stop smoking call [facility] (1-800-QUIT-NOW)       Care Management & Community Referrals:  Referral 06/09/2012  Referrals made for care management support none needed

## 2012-06-11 ENCOUNTER — Encounter: Payer: Self-pay | Admitting: Internal Medicine

## 2012-06-13 ENCOUNTER — Ambulatory Visit (HOSPITAL_COMMUNITY)
Admission: RE | Admit: 2012-06-13 | Discharge: 2012-06-13 | Disposition: A | Discharge status: Home / Self Care | Payer: Medicare Other | Source: Ambulatory Visit | Attending: Cardiology | Admitting: Cardiology

## 2012-06-13 DIAGNOSIS — R0789 Other chest pain: Secondary | ICD-10-CM

## 2012-06-13 DIAGNOSIS — R079 Chest pain, unspecified: Secondary | ICD-10-CM | POA: Insufficient documentation

## 2012-06-13 MED ORDER — TECHNETIUM TC 99M SESTAMIBI GENERIC - CARDIOLITE
10.0000 | Freq: Once | INTRAVENOUS | Status: AC | PRN
  Administered 2012-06-13: 10 via INTRAVENOUS

## 2012-06-13 MED ORDER — TECHNETIUM TC 99M SESTAMIBI GENERIC - CARDIOLITE
30.0000 | Freq: Once | INTRAVENOUS | Status: AC | PRN
  Administered 2012-06-13: 30 via INTRAVENOUS

## 2012-06-13 NOTE — Procedures (Addendum)
[place] [address] [place] [address] [id number]  Cardiology Nuclear Med Study  Kayla Mcclure is a 67 y.o. female     MRN : [id number]     DOB: 1946-03-31  Procedure Date: 06/13/2012  Nuclear Med Background Indication for Stress Test:  Stent Patency and PTCA Patency History:  Asthma, COPD and CAD;MI-2009;STENT/PTCA-08/2007 Cardiac Risk Factors: Claudication, Hypertension, Lipids, Obesity and Smoker  Symptoms:  Chest Pain and SOB   Nuclear Pre-Procedure Caffeine/Decaff Intake:  10:00pm NPO After: 8:00am   IV Site: R Forearm  IV 0.9% NS with Angio Cath:  22g  Chest Size (in):  N/A IV Started by: Azucena Cecil, RN  Height: 5' (1.524 m)  Cup Size: D  BMI:  Body mass index is 31.64 kg/(m^2). Weight:  162 lb (73.483 kg)   Tech Comments:  N/A    Nuclear Med Study 1 or 2 day study: 1 day  Stress Test Type:  Stress  Order Authorizing Provider:  AMELIA WILSON,MD   Resting Radionuclide: Technetium 92m Sestamibi  Resting Radionuclide Dose: 9.9 mCi   Stress Radionuclide:  Technetium 76m Sestamibi  Stress Radionuclide Dose: 30.3 mCi           Stress Protocol Rest HR: 90 Stress HR:139  Rest BP:134/75 Stress BP: 202/61  Exercise Time (min): 5:40 METS: 6.20          Dose of Adenosine (mg):  n/a Dose of Lexiscan: n/a mg  Dose of Atropine (mg): n/a Dose of Dobutamine: n/a mcg/kg/min (at max HR)  Stress Test Technologist: Mellody Memos, CCT Nuclear Technologist: Otho Perl, CNMT   Rest Procedure:  Myocardial perfusion imaging was performed at rest 45 minutes following the intravenous administration of Technetium 84m Sestamibi. Stress Procedure:  The patient performed treadmill exercise using a Bruce  Protocol for 5 minutes and 40 seconds. The patient stopped due to right leg pain,shortness of breath and fatigue. Patient denied any chest pain.  There were significant ST-T wave changes.  Technetium 49m Sestamibi was  injected at peak exercise and myocardial perfusion imaging was performed after a brief delay.  Transient Ischemic Dilatation (Normal <1.22):  0.72 Lung/Heart Ratio (Normal <0.45):  0.23 QGS EDV:  36 ml QGS ESV:  7 ml LV Ejection Fraction: 81%  Signed by     Rest ECG: NSR with inferolateral ST changes  Stress ECG: More pronounced inferolateral STT changes compared to baseline  QPS Raw Data Images:  Normal; no motion artifact; normal heart/lung ratio. Stress Images:  Normal homogeneous uptake in all areas of the myocardium. Rest Images:  Normal homogeneous uptake in all areas of the myocardium. Subtraction (SDS):  Normal  Impression Exercise Capacity:  Fair exercise capacity. BP Response:  Hypertensive blood pressure response. Clinical Symptoms:  Mild shortness of breath without chest pain. ECG Impression:  0.5 - 1 mm ST depression with more pronunced T wave inversion suggestive of ischemia versus strain in baseline abnormal leads. Comparison with Prior Nuclear Study: No previous nuclear study performed  Overall Impression:  Low risk stress nuclear study demonstrating normal perfusion with ECG abnormalities.  LV Wall Motion:  NL LV Function, EF 81% NL Wall Motion   KELLY,Whitner A, MD  06/13/2012 1:01 PM

## 2012-06-26 ENCOUNTER — Other Ambulatory Visit (HOSPITAL_COMMUNITY): Payer: Self-pay | Admitting: Cardiology

## 2012-06-26 DIAGNOSIS — I70219 Atherosclerosis of native arteries of extremities with intermittent claudication, unspecified extremity: Secondary | ICD-10-CM

## 2012-07-04 ENCOUNTER — Ambulatory Visit (HOSPITAL_COMMUNITY)
Admission: RE | Admit: 2012-07-04 | Discharge: 2012-07-04 | Disposition: A | Discharge status: Home / Self Care | Payer: Medicare Other | Source: Ambulatory Visit | Attending: Cardiology | Admitting: Cardiology

## 2012-07-04 DIAGNOSIS — I70219 Atherosclerosis of native arteries of extremities with intermittent claudication, unspecified extremity: Secondary | ICD-10-CM | POA: Insufficient documentation

## 2012-07-04 NOTE — Progress Notes (Signed)
Lower extremity arterial complete. [place]

## 2012-07-28 ENCOUNTER — Ambulatory Visit (HOSPITAL_COMMUNITY): Payer: Medicare Other | Attending: Internal Medicine

## 2012-08-29 ENCOUNTER — Encounter: Payer: Medicare Other | Admitting: Internal Medicine

## 2012-11-07 ENCOUNTER — Encounter: Payer: Self-pay | Admitting: Cardiology

## 2012-11-24 ENCOUNTER — Encounter (HOSPITAL_COMMUNITY): Payer: Self-pay | Admitting: Emergency Medicine

## 2012-11-24 ENCOUNTER — Emergency Department (HOSPITAL_COMMUNITY): Payer: Medicare Other

## 2012-11-24 ENCOUNTER — Emergency Department (HOSPITAL_COMMUNITY)
Admission: EM | Admit: 2012-11-24 | Discharge: 2012-11-24 | Disposition: A | Discharge status: Home / Self Care | Payer: Medicare Other | Attending: Emergency Medicine | Admitting: Emergency Medicine

## 2012-11-24 DIAGNOSIS — Y929 Unspecified place or not applicable: Secondary | ICD-10-CM | POA: Insufficient documentation

## 2012-11-24 DIAGNOSIS — M25579 Pain in unspecified ankle and joints of unspecified foot: Secondary | ICD-10-CM | POA: Insufficient documentation

## 2012-11-24 DIAGNOSIS — Z8719 Personal history of other diseases of the digestive system: Secondary | ICD-10-CM | POA: Insufficient documentation

## 2012-11-24 DIAGNOSIS — M25519 Pain in unspecified shoulder: Secondary | ICD-10-CM | POA: Insufficient documentation

## 2012-11-24 DIAGNOSIS — I129 Hypertensive chronic kidney disease with stage 1 through stage 4 chronic kidney disease, or unspecified chronic kidney disease: Secondary | ICD-10-CM | POA: Insufficient documentation

## 2012-11-24 DIAGNOSIS — N182 Chronic kidney disease, stage 2 (mild): Secondary | ICD-10-CM | POA: Insufficient documentation

## 2012-11-24 DIAGNOSIS — R296 Repeated falls: Secondary | ICD-10-CM | POA: Insufficient documentation

## 2012-11-24 DIAGNOSIS — Y9301 Activity, walking, marching and hiking: Secondary | ICD-10-CM | POA: Insufficient documentation

## 2012-11-24 DIAGNOSIS — Z7982 Long term (current) use of aspirin: Secondary | ICD-10-CM | POA: Insufficient documentation

## 2012-11-24 DIAGNOSIS — M79609 Pain in unspecified limb: Secondary | ICD-10-CM | POA: Insufficient documentation

## 2012-11-24 DIAGNOSIS — Z7902 Long term (current) use of antithrombotics/antiplatelets: Secondary | ICD-10-CM | POA: Insufficient documentation

## 2012-11-24 DIAGNOSIS — I2510 Atherosclerotic heart disease of native coronary artery without angina pectoris: Secondary | ICD-10-CM | POA: Insufficient documentation

## 2012-11-24 DIAGNOSIS — G4700 Insomnia, unspecified: Secondary | ICD-10-CM | POA: Insufficient documentation

## 2012-11-24 DIAGNOSIS — Z79899 Other long term (current) drug therapy: Secondary | ICD-10-CM | POA: Insufficient documentation

## 2012-11-24 DIAGNOSIS — I252 Old myocardial infarction: Secondary | ICD-10-CM | POA: Insufficient documentation

## 2012-11-24 DIAGNOSIS — F17200 Nicotine dependence, unspecified, uncomplicated: Secondary | ICD-10-CM | POA: Insufficient documentation

## 2012-11-24 DIAGNOSIS — M25569 Pain in unspecified knee: Secondary | ICD-10-CM | POA: Insufficient documentation

## 2012-11-24 DIAGNOSIS — J45909 Unspecified asthma, uncomplicated: Secondary | ICD-10-CM | POA: Insufficient documentation

## 2012-11-24 DIAGNOSIS — M79604 Pain in right leg: Secondary | ICD-10-CM

## 2012-11-24 MED ORDER — HYDROCODONE-ACETAMINOPHEN 5-325 MG PO TABS: 2.0000 | ORAL_TABLET | Freq: Four times a day (QID) | ORAL | Status: DC | PRN

## 2012-11-24 MED ORDER — HYDROCODONE-ACETAMINOPHEN 5-325 MG PO TABS
2.0000 | ORAL_TABLET | Freq: Once | ORAL | Status: AC
  Administered 2012-11-24: 2 via ORAL
  Filled 2012-11-24: qty 2

## 2012-11-24 NOTE — Progress Notes (Signed)
Orthopedic Tech Progress Note Patient Details:  Kayla Mcclure 1946-02-28 [id number]  Ortho Devices Type of Ortho Device: ASO;Knee Sleeve Ortho Device/Splint Location: right lle Ortho Device/Splint Interventions: Application   Kayla Mcclure 11/24/2012, 8:45 PM

## 2012-11-24 NOTE — ED Provider Notes (Signed)
Medical screening examination/treatment/procedure(s) were performed by non-physician practitioner and as supervising physician I was immediately available for consultation/collaboration.   Julianne Rice, MD 11/24/12 2322

## 2012-11-24 NOTE — ED Notes (Signed)
Paged ortho and they are on the way to apply knee sleeve and ASO ankle.

## 2012-11-24 NOTE — ED Notes (Signed)
Pt states around 1p her knee gave out on her and she fell back on her leg and its been hurting since, all the way down to her toes.

## 2012-11-24 NOTE — ED Provider Notes (Signed)
CSN: [id number]     Arrival date & time 11/24/12  1801 History   First MD Initiated Contact with Patient 11/24/12 1936     Chief Complaint  Patient presents with  . Leg Injury   (Consider location/radiation/quality/duration/timing/severity/associated sxs/prior Treatment) HPI Comments: Patient presents emergency department with chief complaint of leg pain. She states that she was walking this morning, and while getting into the car, her knee gave out. She states that she fell forward onto the ground. She denies any head injury, loss of consciousness. She states that she had knee pain and ankle pain following a fall. She has not tried anything to alleviate her symptoms. She states that she has been reluctant to weight-bear on her leg. States the pain is moderate. She also complains of some radicular type pain in the right shoulder. He states that she is being treated with prednisone for this.  The history is provided by the patient. No language interpreter was used.    Past Medical History  Diagnosis Date  . Hypertension   . MI (myocardial infarction) 08/05/2007  . Smoker   . CAD 08/05/2007    MI 2009 - DES to RCA   . CKD, stage 2 08/15/2008  . Internal hemorrhoids 04/11/2010    Colonoscopy 04/11/2010   . Diverticulosis 04/11/2010    Colonoscopy 04/11/2010   . INSOMNIA, CHRONIC 10/07/2009  . ASTHMA 10/31/2007  . BACK PAIN, RIGHT 09/23/2008   Past Surgical History  Procedure Laterality Date  . Cardiac catheterization  08/05/2007    DES to RCA  . Colonoscopy  04/11/2010  . Cholecystectomy  1975   Family History  Problem Relation Age of Onset  . Diabetes Mother   . Heart disease Mother   . Diabetes Sister   . Heart attack Mother 75  . Heart attack Father 3  . Breast cancer Sister 27  . Breast cancer Mother 12  . Colon cancer Mother 57   History  Substance Use Topics  . Smoking status: Current Every Day Smoker -- 1.00 packs/day    Types: Cigarettes  . Smokeless tobacco: Not on  file  . Alcohol Use: Yes     Comment: social   OB History   Grav Para Term Preterm Abortions TAB SAB Ect Mult Living   1 1             Review of Systems  All other systems reviewed and are negative.    Allergies  Review of patient's allergies indicates no known allergies.  Home Medications   Current Outpatient Rx  Name  Route  Sig  Dispense  Refill  . albuterol (PROVENTIL HFA;VENTOLIN HFA) 108 (90 BASE) MCG/ACT inhaler   Inhalation   Inhale 2 puffs into the lungs every 4 (four) hours as needed. For shortness of pain         . amLODipine (NORVASC) 5 MG tablet   Oral   Take 5 mg by mouth daily.         Marland Kitchen aspirin EC 81 MG tablet   Oral   Take 81 mg by mouth daily.         Marland Kitchen atorvastatin (LIPITOR) 40 MG tablet   Oral   Take 40 mg by mouth daily.         . calcium-vitamin D (OSCAL WITH D) 500-200 MG-UNIT per tablet   Oral   Take 1 tablet by mouth daily.         . clopidogrel (PLAVIX) 75 MG tablet  Oral   Take 75 mg by mouth daily.         . famotidine (PEPCID) 40 MG tablet   Oral   Take 40 mg by mouth 2 (two) times daily.         . ferrous sulfate 325 (65 FE) MG EC tablet   Oral   Take 325 mg by mouth daily with breakfast.         . Fluticasone-Salmeterol (ADVAIR) 100-50 MCG/DOSE AEPB   Inhalation   Inhale 1 puff into the lungs 2 (two) times daily as needed (shortness of breath).         . hydrochlorothiazide (HYDRODIURIL) 25 MG tablet   Oral   Take 25 mg by mouth daily.         Marland Kitchen loratadine (CLARITIN) 10 MG tablet   Oral   Take 10 mg by mouth daily as needed for allergies.         Marland Kitchen losartan (COZAAR) 100 MG tablet   Oral   Take 100 mg by mouth daily.         . metoprolol succinate (TOPROL-XL) 100 MG 24 hr tablet   Oral   Take 150 mg by mouth daily. Take with or immediately following a meal.         . traZODone (DESYREL) 50 MG tablet   Oral   Take 50-100 mg by mouth at bedtime as needed for sleep.         Marland Kitchen  HYDROcodone-acetaminophen (NORCO/VICODIN) 5-325 MG per tablet   Oral   Take 2 tablets by mouth every 6 (six) hours as needed for pain.   15 tablet   0    BP 139/74  Pulse 83  Temp(Src) 98.3 F (36.8 C) (Oral)  Resp 18  SpO2 98% Physical Exam  Nursing note and vitals reviewed. Constitutional: She is oriented to person, place, and time. She appears well-developed and well-nourished.  HENT:  Head: Normocephalic and atraumatic.  Eyes: Conjunctivae and EOM are normal. Pupils are equal, round, and reactive to light.  Neck: Normal range of motion. Neck supple.  Cardiovascular: Normal rate, regular rhythm and intact distal pulses.  Exam reveals no gallop and no friction rub.   No murmur heard. Pulmonary/Chest: Effort normal and breath sounds normal. No respiratory distress. She has no wheezes. She has no rales. She exhibits no tenderness.  Abdominal: Soft. Bowel sounds are normal. She exhibits no distension and no mass. There is no tenderness. There is no rebound and no guarding.  Musculoskeletal: Normal range of motion. She exhibits no edema and no tenderness.  Right knee tenderness palpation over the lateral aspect, right ankle tender to palpation over lateral aspect, range of motion and strength deferred secondary to pain, no bony abnormalities or deformities  Neurological: She is alert and oriented to person, place, and time.  Skin: Skin is warm and dry.  Psychiatric: She has a normal mood and affect. Her behavior is normal. Judgment and thought content normal.    ED Course  Procedures (including critical care time) Labs Review Labs Reviewed - No data to display Imaging Review Dg Ankle Complete Right  11/24/2012   *RADIOLOGY REPORT*  Clinical Data: Right ankle pain after fall  RIGHT ANKLE - COMPLETE 3+ VIEW  Comparison: None.  Findings: No fracture or dislocation is noted.  Joint spaces are intact. No soft tissue abnormality is noted.  IMPRESSION: Normal right ankle.   Original  Report Authenticated By: Marijo Conception.,  M.D.   Dg  Knee Complete 4 Views Right  11/24/2012   CLINICAL DATA:  . Right knee pain.  EXAM: RIGHT KNEE - COMPLETE 4+ VIEW  COMPARISON:  None.  FINDINGS: No acute bony abnormality. Specifically, no fracture, subluxation, or dislocation. Soft tissues are intact. Joint spaces are maintained. Normal bone mineralization. No joint effusion.  IMPRESSION: Negative.   Electronically Signed   By: Rolm Baptise   On: 11/24/2012 19:10    MDM   1. Leg pain, right    2, right knee pain 3, right ankle pain  Patient with knee and ankle pain following a fall. No evidence of acute fracture. Will give the patient a knee brace, and ankle brace. Recommend getting a walker at medical supply. Patient understands and agrees with the plan. Plan is to followup with orthopedics or primary care. Patient is stable and ready for discharge. Additionally, will give the patient a short course of pain medicine.   Montine Circle, PA-C 11/24/12 2005

## 2012-11-24 NOTE — ED Notes (Signed)
PT states that "my leg gave out on me this morning". States that she was walking and right leg gave out on her, bent forward, and fell back onto leg. NO LOC. No other issues Hx of leg pain

## 2012-11-24 NOTE — ED Notes (Signed)
Radiology called to take pt to [facility] 7

## 2012-12-15 ENCOUNTER — Encounter: Payer: Self-pay | Admitting: Cardiology

## 2012-12-22 ENCOUNTER — Encounter: Payer: Self-pay | Admitting: Cardiology

## 2012-12-22 ENCOUNTER — Ambulatory Visit (INDEPENDENT_AMBULATORY_CARE_PROVIDER_SITE_OTHER): Payer: Medicare Other | Admitting: Cardiology

## 2012-12-22 VITALS — BP 138/72 | HR 79 | Ht 59.0 in | Wt 150.7 lb

## 2012-12-22 DIAGNOSIS — I739 Peripheral vascular disease, unspecified: Secondary | ICD-10-CM | POA: Insufficient documentation

## 2012-12-22 DIAGNOSIS — E669 Obesity, unspecified: Secondary | ICD-10-CM | POA: Insufficient documentation

## 2012-12-22 DIAGNOSIS — F17200 Nicotine dependence, unspecified, uncomplicated: Secondary | ICD-10-CM

## 2012-12-22 DIAGNOSIS — Z9861 Coronary angioplasty status: Secondary | ICD-10-CM

## 2012-12-22 DIAGNOSIS — I2510 Atherosclerotic heart disease of native coronary artery without angina pectoris: Secondary | ICD-10-CM

## 2012-12-22 DIAGNOSIS — I209 Angina pectoris, unspecified: Secondary | ICD-10-CM | POA: Insufficient documentation

## 2012-12-22 DIAGNOSIS — I10 Essential (primary) hypertension: Secondary | ICD-10-CM

## 2012-12-22 DIAGNOSIS — E785 Hyperlipidemia, unspecified: Secondary | ICD-10-CM

## 2012-12-22 MED ORDER — ISOSORBIDE MONONITRATE ER 30 MG PO TB24: 30.0000 mg | ORAL_TABLET | Freq: Every day | ORAL | Status: DC

## 2012-12-22 MED ORDER — NITROGLYCERIN 0.4 MG SL SUBL: 0.4000 mg | SUBLINGUAL_TABLET | SUBLINGUAL | Status: DC | PRN

## 2012-12-22 NOTE — Patient Instructions (Addendum)
Your physician wants you to follow-up in 2 month Dr Ellyn Hack. You will receive a reminder letter in the mail two months in advance. If you don't receive a letter, please call our office to schedule the follow-up appointment.  Start Imdur (Isosorbide MN) 30 MG one tablet daily

## 2012-12-22 NOTE — Progress Notes (Signed)
PCP: Kevan Ny, MD  Clinic Note: Chief Complaint  Patient presents with  . ROV 6 months    Fatigue, lightheadedness from time to time when bending over. Pt fell ~2 weeks ago and has some back pain.    HPI: Kayla Mcclure is a 67 y.o. female with a PMH below who presents today for six-month followup. She is a former patient of Dr. Melvern Banker, but was not seen since 2009 because she "slipped through the cracks. "She had non-STEMI back in 2009 and underwent PCI with a Promus DES for a severe RCA lesion. I first met her in March of this year when she came in as a followup from a recent hospital stay in March at which presented with chest pain. At that time she was having a tugging sensation in the left side of her chest that sounded relatively atypical in nature. This was evaluated with a Myoview that did not show any evidence of ischemia or infarction, but there were EKG changes consistent with ischemia. She only walked for 5 minutes 40 seconds reaching 6.2 METs.   She also complained of symptoms that were somewhat concerning for possible claudication. This is mostly heaviness in her legs. She was evaluated with Lower Extremity Arterial Dopplers with abnormal results noted below as well.  Interval History: Since that time she did okay, but notes in the last couple weeks to about a month she's been noticing an occasional sensation up underneath her left breast visit in the usual 8 did you say happens at rest after she is done something like mowing the lawn or his come back from taking out the garbage that was very heavy. It'll last for a few minutes, where she'll a sleep shirt hand against her rib cage to try to alleviate it. These episodes last several minutes. They're not necessarily exacerbated with exertion and no necessarily occur with exertion. She says it is somewhat similar to what she had in March but more similar to 2009 symptoms of angina. Did not associate with dyspnea and she does not  have dyspnea with exertion. She has occasional palpitations but they're not associated with these episodes either. Now the palpitations lead to any lightheadedness, dizziness or wooziness. She denies any syncope or near-syncope. No TIA or RCA symptoms. No PND, orthopnea or edema. She denies any melena, hematochezia or hematuria.  She does note a sense of leg heaviness and fatigued when she is walking around the grocery store, but not at rest. This isn't limiting her from walking, but is very uncomfortable.  She also notes that her lipids were recently checked by her PCP.  Past Medical History  Diagnosis Date  . History of Non-STEMI (non-ST elevated myocardial infarction) 08/05/2007    Hx of CAD with a PCI to the RCA Promus DES 2.5 mm x 23 mm (3.0 mm)  . Chest pain with minimal risk for cardiac etiology 06/03/2012    Treadmill Myoview:There were inferolateral ST changes that were more pronounced w/exercise, but study was essentially normal w/no evidence of ischemia, EF = 81%.   Marland Kitchen Dyspnea on exertion November 19,009    Echo: Is that  . Hypertension   . Dyslipidemia, goal LDL below 70   . Dyspnea on exertion 02/19/2008    ECHO 02/19/08   . Hypertension     Treadmill Myoview 06/03/12 There were inferolateral ST changes that were more pronounced w/exercise, but study was essentially normal w/no evidence of ischemia, EF = 81%.   . CKD,  stage 2 08/15/2008  . Chronic leg pain April 2014    Lower extremity arterial Dopplers BILATERAL CIAs: 50-69% diameter reduction.  BILATERAL CFAs: 50-69% diameter reduction.  . Cigarette smoker one half pack a day or less     Down to 7 cigarettes a day  . Internal hemorrhoids  04/11/2010    By colonoscopy  . Diverticulosis   04/11/2010     Colonoscopy  . Insomnia      chronic since 2000 11   . Asthma  10/31/2007  . Chronic back pain   . Iron deficiency anemia   . GERD (gastroesophageal reflux disease)     Prior Cardiac Evaluation and Past Surgical  History: Past Surgical History  Procedure Laterality Date  . Cardiac catheterization  08/05/2007    DES to RCA; EF 60-70%  . Colonoscopy  04/11/2010  . Cholecystectomy  1975  . Coronary angioplasty  08/2007    Promus DES 2.5 mm x 23 mm (3 mm) to RCA    No Known Allergies  Current Outpatient Prescriptions  Medication Sig Dispense Refill  . albuterol (PROVENTIL HFA;VENTOLIN HFA) 108 (90 BASE) MCG/ACT inhaler Inhale 2 puffs into the lungs every 4 (four) hours as needed. For shortness of pain      . amLODipine (NORVASC) 5 MG tablet Take 5 mg by mouth daily.      Marland Kitchen aspirin EC 81 MG tablet Take 81 mg by mouth daily.      Marland Kitchen atorvastatin (LIPITOR) 40 MG tablet Take 40 mg by mouth daily.      . calcium-vitamin D (OYSTER CALCIUM 500 + D) 500-200 MG-UNIT per tablet Take 1 tablet by mouth.      . clopidogrel (PLAVIX) 75 MG tablet Take 75 mg by mouth daily.      . famotidine (PEPCID) 40 MG tablet Take 40 mg by mouth 2 (two) times daily.      . ferrous sulfate 325 (65 FE) MG EC tablet Take 325 mg by mouth daily with breakfast.      . Fluticasone-Salmeterol (ADVAIR) 100-50 MCG/DOSE AEPB Inhale 1 puff into the lungs 2 (two) times daily as needed (shortness of breath).      . hydrochlorothiazide (HYDRODIURIL) 25 MG tablet Take 25 mg by mouth daily.      Marland Kitchen loratadine (CLARITIN) 10 MG tablet Take 10 mg by mouth daily as needed for allergies.      Marland Kitchen losartan (COZAAR) 100 MG tablet Take 100 mg by mouth daily.      . metoprolol succinate (TOPROL-XL) 100 MG 24 hr tablet Take 150 mg by mouth daily. Take with or immediately following a meal.      . traZODone (DESYREL) 50 MG tablet Take 50-100 mg by mouth at bedtime as needed for sleep.      Marland Kitchen HYDROcodone-acetaminophen (NORCO/VICODIN) 5-325 MG per tablet Take 2 tablets by mouth every 6 (six) hours as needed for pain.  15 tablet  0  . isosorbide mononitrate (IMDUR) 30 MG 24 hr tablet Take 1 tablet (30 mg total) by mouth daily.  30 tablet  11  . nitroGLYCERIN  (NITROSTAT) 0.4 MG SL tablet Place 1 tablet (0.4 mg total) under the tongue every 5 (five) minutes as needed for chest pain.  25 tablet  6   No current facility-administered medications for this visit.    History   Social History Narrative   Married. Has one child. Grandmother for a great-grandmother and 1.   No real exercise.   Currently smokes 7  or less alcohol today (sometimes she says they more chest burn out of the ashtray).   Denies alcohol consumption.         ROS: A comprehensive Review of Systems - Negative except Pertinent positives noted above. No notable GI or GU symptoms. No fevers chills or sweats. No significant weight changes. All other systems are negative  PHYSICAL EXAM BP 138/72  Pulse 79  Ht 4\' 11"  (1.499 m)  Wt 150 lb 11.2 oz (68.357 kg)  BMI 30.42 kg/m2 General appearance: alert, cooperative, appears stated age, no distress and mildly obese Neck: no adenopathy, no carotid bruit, no JVD and supple, symmetrical, trachea midline Lungs: clear to auscultation bilaterally, normal percussion bilaterally and Nonlabored, good air movement Heart: regular rate and rhythm, S1, S2 normal, no murmur, click, rub or gallop and normal apical impulse Abdomen: soft, non-tender; bowel sounds normal; no masses,  no organomegaly Extremities: extremities normal, atraumatic, no cyanosis or edema and venous stasis dermatitis noted Pulses: Bilateral radial pulses are 2+. Pedal pulses both plusand posterior tibial pulses are palpable, but trace to 1+. Neurologic: Alert and oriented X 3, normal strength and tone. Normal symmetric reflexes. Normal coordination and gait  [id number] today: Yes Rate: 79 , Rhythm:  NSR, normal ECG  Recent Labs:  she she actually just had her labs checked by her PCP, I do not have the records with me. Her Lipitor dose was increased to 40 mg based on those results.  ASSESSMENT / PLAN: Angina, class I I am somewhat concerned that these symptoms that she  is having is similar to her prior symptoms with a non-STEMI. While they don't occur with exertion, do occur after exertion. They also occur after eating.   She is on a reasonable antianginal regimen with amlodipine statin aspirin/Plavix, ARB, and metoprolol.  Plan: Add Imdur 30 mg daily as well as when necessary sublingual nitroglycerin. We'll reassess in roughly 2 months, if her symptoms are progressively worse than it occluded proceed with cardiac catheterization, if they improve I think continued optimization of her medical therapy is warranted. She had a stress test back in March which was nonischemic, but the symptoms have changed a bit and are more pronounced as well as more similar to her prior anginal equivalent. Out of low risk for referral for cardiac catheterization if her symptoms increase in class I and class 2-3 angina with her being on optimal medical therapy.   CAD S/P percutaneous coronary angioplasty -- Promus DES 2.5 mm x 20 mm postdilated to 3 mm Previously stable.  She remains on dual antiplatelet therapy along with beta blocker and ARB she also has additional antianginal effect of amlodipine. She is additionally on statin and increase dose.  Again I am concerned of her symptoms. Please see above for a process.  Essential hypertension, benign Borderline adequate blood pressure control on multiple medications. If blood pressure becomes more difficult to control, would consider switching from metoprolol succinate to carvedilol or Bystolic.  Dyslipidemia, goal LDL below 70 Recently increased dose of Lipitor from 20-40. She is due for recheck of labs from her primary physician. We await the results.  Obesity (BMI 30-39.9) I did spend a few minutes on direct importance of dietary modification and increased exercise. I also explained to her that with her also claudication symptoms and therefore true disease that this is the best way to increase collateral flow.  Smokes tobacco  daily No real change in her progress. She says she has cravings, to just withdraw. I  talked about the importance of smoking cessation both with cardiovascular disease including coronary disease and peripheral arterial disease which he both suffer from as well as COPD and cancer.  I discussed the potential of using Nicoderm patch or electronic cigarettes to avoid the withdrawal symptoms. I also reiterated to her primary care physician's recommendation to call [facility].   PAD (peripheral artery disease) - bilateral CIA & CFA 50-69%; Claudication For now I think her claudication symptoms will have to be on the back burner until we better address her possible coronary symptoms. It may become an issue with common femoral disease as far as getting access for cardiac catheterization. If we do proceed with cardiac catheterization, I would probably try to go for radial approach and do an abdominal aortic angiogram with bilateral iliofemoral runoff.    Orders Placed This Encounter  Procedures  . EKG 12-Lead   Meds ordered this encounter  Medications  . calcium-vitamin D (OYSTER CALCIUM 500 + D) 500-200 MG-UNIT per tablet    Sig: Take 1 tablet by mouth.  . isosorbide mononitrate (IMDUR) 30 MG 24 hr tablet    Sig: Take 1 tablet (30 mg total) by mouth daily.    Dispense:  30 tablet    Refill:  11  . nitroGLYCERIN (NITROSTAT) 0.4 MG SL tablet    Sig: Place 1 tablet (0.4 mg total) under the tongue every 5 (five) minutes as needed for chest pain.    Dispense:  25 tablet    Refill:  6    Followup: 2 months  Kayla Mcclure, M.D., M.S. [facility] [address] [place], [place]  [address]  [phone] Pager # [phone]

## 2012-12-23 DIAGNOSIS — E785 Hyperlipidemia, unspecified: Secondary | ICD-10-CM | POA: Insufficient documentation

## 2012-12-23 NOTE — Assessment & Plan Note (Signed)
I did spend a few minutes on direct importance of dietary modification and increased exercise. I also explained to her that with her also claudication symptoms and therefore true disease that this is the best way to increase collateral flow.

## 2012-12-23 NOTE — Assessment & Plan Note (Signed)
Borderline adequate blood pressure control on multiple medications. If blood pressure becomes more difficult to control, would consider switching from metoprolol succinate to carvedilol or Bystolic.

## 2012-12-23 NOTE — Assessment & Plan Note (Signed)
Recently increased dose of Lipitor from 20-40. She is due for recheck of labs from her primary physician. We await the results.

## 2012-12-23 NOTE — Assessment & Plan Note (Signed)
For now I think her claudication symptoms will have to be on the back burner until we better address her possible coronary symptoms. It may become an issue with common femoral disease as far as getting access for cardiac catheterization. If we do proceed with cardiac catheterization, I would probably try to go for radial approach and do an abdominal aortic angiogram with bilateral iliofemoral runoff.

## 2012-12-23 NOTE — Assessment & Plan Note (Signed)
I am somewhat concerned that these symptoms that she is having is similar to her prior symptoms with a non-STEMI. While they don't occur with exertion, do occur after exertion. They also occur after eating.   She is on a reasonable antianginal regimen with amlodipine statin aspirin/Plavix, ARB, and metoprolol.  Plan: Add Imdur 30 mg daily as well as when necessary sublingual nitroglycerin. We'll reassess in roughly 2 months, if her symptoms are progressively worse than it occluded proceed with cardiac catheterization, if they improve I think continued optimization of her medical therapy is warranted. She had a stress test back in March which was nonischemic, but the symptoms have changed a bit and are more pronounced as well as more similar to her prior anginal equivalent. Out of low risk for referral for cardiac catheterization if her symptoms increase in class I and class 2-3 angina with her being on optimal medical therapy.

## 2012-12-23 NOTE — Assessment & Plan Note (Signed)
No real change in her progress. She says she has cravings, to just withdraw. I talked about the importance of smoking cessation both with cardiovascular disease including coronary disease and peripheral arterial disease which he both suffer from as well as COPD and cancer.  I discussed the potential of using Nicoderm patch or electronic cigarettes to avoid the withdrawal symptoms. I also reiterated to her primary care physician's recommendation to call [facility].

## 2012-12-23 NOTE — Assessment & Plan Note (Signed)
Previously stable.  She remains on dual antiplatelet therapy along with beta blocker and ARB she also has additional antianginal effect of amlodipine. She is additionally on statin and increase dose.  Again I am concerned of her symptoms. Please see above for a process.

## 2013-03-03 ENCOUNTER — Encounter: Payer: Self-pay | Admitting: Cardiology

## 2013-03-03 ENCOUNTER — Ambulatory Visit (INDEPENDENT_AMBULATORY_CARE_PROVIDER_SITE_OTHER): Payer: Medicare Other | Admitting: Cardiology

## 2013-03-03 VITALS — BP 118/64 | HR 76 | Ht 60.0 in | Wt 150.3 lb

## 2013-03-03 DIAGNOSIS — Z9861 Coronary angioplasty status: Secondary | ICD-10-CM

## 2013-03-03 DIAGNOSIS — I2510 Atherosclerotic heart disease of native coronary artery without angina pectoris: Secondary | ICD-10-CM

## 2013-03-03 DIAGNOSIS — F17200 Nicotine dependence, unspecified, uncomplicated: Secondary | ICD-10-CM

## 2013-03-03 DIAGNOSIS — I739 Peripheral vascular disease, unspecified: Secondary | ICD-10-CM

## 2013-03-03 DIAGNOSIS — I10 Essential (primary) hypertension: Secondary | ICD-10-CM

## 2013-03-03 DIAGNOSIS — E785 Hyperlipidemia, unspecified: Secondary | ICD-10-CM

## 2013-03-03 DIAGNOSIS — N182 Chronic kidney disease, stage 2 (mild): Secondary | ICD-10-CM

## 2013-03-03 DIAGNOSIS — I209 Angina pectoris, unspecified: Secondary | ICD-10-CM

## 2013-03-03 NOTE — Patient Instructions (Signed)
Glad to see your chest pressure has improved.  YOur BP lookds good -- if you do get light headed, it is oK ot hold the Amlodipine or cut the Losartan in 1/2.  With your leg aching getting worse, I would like to repeat your leg artery ultrasounds to see if anything is worse. If stable, I will see you back in 6 months.  If they are worse, I will have your scheduled to see Dr.Berry to discuss possible procedures to open up the leg arteries.  Leonie Man, MD   Your physician has requested that you have a lower extremity arterial exercise duplex. During this test, exercise and ultrasound are used to evaluate arterial blood flow in the legs. Allow one hour for this exam. There are no restrictions or special instructions.  Your physician wants you to follow-up in: 6 months. You will receive a reminder letter in the mail two months in advance. If you don't receive a letter, please call our office to schedule the follow-up appointment.

## 2013-03-03 NOTE — Progress Notes (Signed)
PATIENT: Kayla Mcclure MRN: [id number]  DOB: 01-12-1946   DOV:03/06/2013 PCP: Kevan Ny, MD  Clinic Note: Chief Complaint  Patient presents with  . Follow-up    2 months:  Still c/o fatigue (legs become tired if she stands or walks too long).  No chest pain or edema.  Dr. Marlou Sa diagnosed her w/Diabetes in October along with stage IV kidney disease and referred to Nephrology but does not have an appt yet due to they are going to electronic records and are unable to schedule her until they get all the glitches out of their system.   HPI: Kayla Mcclure is a 67 y.o.  female with a PMH below who presents today for three-month followup.  She is a former patient of Dr. Janene Madeira, the LAD and non-STEMI in 2009 with a severe RCA lesion treated with a Promus DES stent. She was lost to followup until a hospital visit in March of this year. He presented that time the chest discomfort is relatively atypical. She did have a stress test with no scintigraphic evidence of ischemia but did have abnormal ECG changes consistent with ischemia.   She also has moderate peripheral vascular disease by Doppler from April of this year for what sounds like chronic claudication.  Interval History: Last saw her back in September, she was having what sounded like maybe class I angina. Restart her on Imdur with when necessary nitroglycerin. The plan was for return to reevaluate the status of her angina. It first last, she would say that her chest discomfort is much better. But she does still have exertional chest heaviness after walking beyond the point of where her legs bother her enough to stop. Her legs would usually start hurting her earlier than her chest. If she walks "a lot (roughly 1 mile) " she would definitely get some chest pressure and dyspnea. Usually she is not able to do that much walking because the pain in her legs prevents walking beyond about a quarter mile. She did note that this is an improvement of her  exertional chest pressure with the initiation of Imdur. She noticed that the left leg bothers her more than the right. In usually the symptoms go away with rest. She does have some nighttime leg cramping at with a nasal bone non-tingle. This actually improves with walking.  The remainder of Cardiovascular ROS: positive for - dyspnea on exertion, palpitations, rapid heart rate and exertional chest pressure & SOB - but improved negative for - edema, loss of consciousness, murmur, orthopnea, paroxysmal nocturnal dyspnea or shortness of breath: Occasional episodes of rapid HR - not irregular.  Additional cardiac review of systems: 1 episode of light headed & dizzy - near syncope; not associated with rapid HR.  Better with sitting down.  TIA/amaurosis fugax - no Melena - no, hematochezia no; hematuria - no; nosebleeds - no; claudication - no  Down to 6 cig/day -> planning on trying Gum / Patches. She was recently diagnosed by her PCP with early-onset diabetes and what looks like class II CKD   Past Medical History  Diagnosis Date  . History of Non-STEMI (non-ST elevated myocardial infarction) 08/05/2007    Hx of CAD with a PCI to the RCA Promus DES 2.5 mm x 23 mm (3.0 mm)  . Chest pain with minimal risk for cardiac etiology 06/03/2012    Treadmill Myoview:There were inferolateral ST changes that were more pronounced w/exercise, but study was essentially normal w/no evidence of ischemia, EF =  81%.   . Dyspnea on exertion November 19,009    Echo: Is that  . Hypertension   . Dyslipidemia, goal LDL below 70   . Dyspnea on exertion 02/19/2008    ECHO 02/19/08   . Hypertension     Treadmill Myoview 06/03/12 There were inferolateral ST changes that were more pronounced w/exercise, but study was essentially normal w/no evidence of ischemia, EF = 81%.   . CKD, stage 2 08/15/2008  . Chronic leg pain April 2014    Lower extremity arterial Dopplers BILATERAL CIAs: 50-69% diameter reduction.  BILATERAL  CFAs: 50-69% diameter reduction.  . Cigarette smoker one half pack a day or less     Down to 7 cigarettes a day  . Internal hemorrhoids  04/11/2010    By colonoscopy  . Diverticulosis   04/11/2010     Colonoscopy  . Insomnia      chronic since 2000 11   . Asthma  10/31/2007  . Chronic back pain   . Iron deficiency anemia   . GERD (gastroesophageal reflux disease)   DM, CKD 2-3 (Cr 1.56)  Prior Cardiac Evaluation and Past Surgical History: Past Surgical History  Procedure Laterality Date  . Cardiac catheterization  08/05/2007    DES to RCA; EF 60-70%  . Colonoscopy  04/11/2010  . Cholecystectomy  1975  . Coronary angioplasty  08/2007    Promus DES 2.5 mm x 23 mm (3 mm) to RCA     No Known Allergies  Current Outpatient Prescriptions  Medication Sig Dispense Refill  . albuterol (PROVENTIL HFA;VENTOLIN HFA) 108 (90 BASE) MCG/ACT inhaler Inhale 2 puffs into the lungs every 4 (four) hours as needed. For shortness of pain      . amLODipine (NORVASC) 5 MG tablet Take 5 mg by mouth daily.      Marland Kitchen aspirin EC 81 MG tablet Take 81 mg by mouth daily.      Marland Kitchen atorvastatin (LIPITOR) 40 MG tablet Take 40 mg by mouth daily.      . calcium-vitamin D (OYSTER CALCIUM 500 + D) 500-200 MG-UNIT per tablet Take 1 tablet by mouth.      . clopidogrel (PLAVIX) 75 MG tablet Take 75 mg by mouth daily.      . famotidine (PEPCID) 40 MG tablet Take 40 mg by mouth 2 (two) times daily.      . ferrous sulfate 325 (65 FE) MG EC tablet Take 325 mg by mouth daily with breakfast.      . Fluticasone-Salmeterol (ADVAIR) 100-50 MCG/DOSE AEPB Inhale 1 puff into the lungs 2 (two) times daily as needed (shortness of breath).      . hydrochlorothiazide (HYDRODIURIL) 25 MG tablet Take 25 mg by mouth daily.      Marland Kitchen HYDROcodone-acetaminophen (NORCO/VICODIN) 5-325 MG per tablet Take 2 tablets by mouth every 6 (six) hours as needed for pain.  15 tablet  0  . isosorbide mononitrate (IMDUR) 30 MG 24 hr tablet Take 1 tablet (30 mg  total) by mouth daily.  30 tablet  11  . loratadine (CLARITIN) 10 MG tablet Take 10 mg by mouth daily as needed for allergies.      Marland Kitchen losartan (COZAAR) 100 MG tablet Take 100 mg by mouth daily.      . metoprolol succinate (TOPROL-XL) 100 MG 24 hr tablet Take 150 mg by mouth daily. Take with or immediately following a meal.      . nitroGLYCERIN (NITROSTAT) 0.4 MG SL tablet Place 1 tablet (0.4 mg  total) under the tongue every 5 (five) minutes as needed for chest pain.  25 tablet  6  . traZODone (DESYREL) 50 MG tablet Take 50-100 mg by mouth at bedtime as needed for sleep.       No current facility-administered medications for this visit.    History   Social History Narrative   Married. Has one child. Grandmother for a great-grandmother and 1.   No real exercise.   Currently smokes 7 or less alcohol today (sometimes she says they more chest burn out of the ashtray).   Denies alcohol consumption.   ROS: A comprehensive Review of Systems - Negative except Symptoms are noted in history of present illness and intermittent episodes of feeling hot with sweating along with generalized fatigue.  PHYSICAL EXAM BP 118/64  Pulse 76  Ht 5' (1.524 m)  Wt 68.176 kg (150 lb 4.8 oz)  BMI 29.35 kg/m2 General appearance: alert and oriented x3, cooperative, appears stated age, no distress and mildly obese  Neck: no adenopathy, no carotid bruit, no JVD and supple, symmetrical, trachea midline  Lungs: clear to auscultation bilaterally, normal percussion bilaterally and Nonlabored, good air movement  Heart: regular rate and rhythm, S1, S2 normal, no murmur, click, rub or gallop and normal apical impulse  Abdomen: soft, non-tender; bowel sounds normal; no masses, no organomegaly  Extremities: extremities normal, atraumatic, no cyanosis or edema and venous stasis dermatitis noted  Pulses: Bilateral radial pulses are 2+. 1-2+ RLE, 1+ L DP, weaker LPT. Neurologic: Grossly intact  [id number] today:  No  Recent Labs: None  ASSESSMENT / PLAN:  Angina, class I Thankfully, her symptoms seem to have improved with the Imdur. I think for now, as her episodes are only exertional, and less limiting then what sounds like claudication, we'll simply continue to treat medically. She is on beta blocker, ARB along with amlodipine with statins and dual antiplatelet therapy.  Plan: Continue medical therapy for now. If her lower STEMI Dopplers proved to show progression, and there be consideration for lower extremity arterial angiography, I would very much consider coronary angiography at the same time in order to fully delineate her anatomy. If her symptoms progress or worsen now, I would have a low threshold to proceeding with cardiac catheterization.  For now I think she is stable for 6 month followup. If she has worsening symptoms she is to contact us. If her Dopplers show any abnormalities will have her see Dr. Gwenlyn Found in the interim.  CAD S/P percutaneous coronary angioplasty -- Promus DES 2.5 mm x 20 mm postdilated to 3 mm On full dose regimen as noted above. I do think that her overall symptoms have improved. She is on a very good regimen. Her stent was dilated up to 3 mm which would reduce the likelihood of significant in-stent restenosis. She has been on dual antiplatelet ever since was placed.  PAD (peripheral artery disease) - bilateral CIA & CFA 50-69%; Claudication At this point, her angina seems to be less pertinent that her claudication symptoms. He would now appear that her claudication is limiting her ability to exercise more notably than her angina. She did have moderate to moderately severe arterial disease on Dopplers earlier this year. I am concerned that her symptoms got worse especially on the left side. She may need invasive evaluation. Plan: Recheck followup LEA Dopplers, if progression is noted, will schedule to see Dr. Gwenlyn Found. If peripheral angiography is recommended, would do coronary  angiography at the same time.  Smokes tobacco  daily She is not really fully prepared to quit. Again I counseled her about her angina and claudication symptoms as being significant at the affected by her smoking. She understands this and knows she needs to quit, I do still think she has the will power this point in time to do so. Perhaps showing progression of disease in the peripheral vasculature will help pressure the direction to fully stop smoking. I did spend at least 4-5 minutes in direct counseling on the subject.  Dyslipidemia, goal LDL below 70 On increased dose of statin. Being monitored by PCP. Most recent labs from 2012 are noted in the computer and the relatively well-controlled. It unfortunately do not have labs since then. She says they're due to be checked by her PCP soon.  Claudication of left lower extremity > right Rechecking LEA Dopplers.  Anticipate referral to Dr. Gwenlyn Found for peripheral angiography.  I did recommend that she continue walking to the best of her bili. This will help both her cardiac and peripheral vascular standpoint.  Essential hypertension, benign Currently well-controlled on current medications. She is on high-dose ARB and beta blocker along with mid range amlodipine. She has had intermittent spells of dizziness. I told her if she feels a little dizzy, she can cut the amlodipine in half for one to 2 doses.  CKD, stage 2 This would need to be taken into consideration if she were to go for angiography. She would potentially need prolonged hydration pre-and post procedure.    Orders Placed This Encounter  Procedures  . Lower Extremity Arterial Duplex    Standing Status: Future     Number of Occurrences:      Standing Expiration Date: 03/03/2014    Order Specific Question:  Where should this test be performed:    Answer:  [facility]   No orders of the defined types were placed in this encounter.    Followup: ROV 6 months unless LEA dopplers  worse -- then see JB.  DAVID W. Ellyn Hack, M.D., M.S. [facility] [address] [place], [place]  [address]  [phone] Pager # [phone]

## 2013-03-06 ENCOUNTER — Encounter: Payer: Self-pay | Admitting: Cardiology

## 2013-03-06 DIAGNOSIS — I739 Peripheral vascular disease, unspecified: Secondary | ICD-10-CM | POA: Insufficient documentation

## 2013-03-06 NOTE — Assessment & Plan Note (Signed)
At this point, her angina seems to be less pertinent that her claudication symptoms. He would now appear that her claudication is limiting her ability to exercise more notably than her angina. She did have moderate to moderately severe arterial disease on Dopplers earlier this year. I am concerned that her symptoms got worse especially on the left side. She may need invasive evaluation. Plan: Recheck followup LEA Dopplers, if progression is noted, will schedule to see Dr. Gwenlyn Found. If peripheral angiography is recommended, would do coronary angiography at the same time.

## 2013-03-06 NOTE — Assessment & Plan Note (Signed)
She is not really fully prepared to quit. Again I counseled her about her angina and claudication symptoms as being significant at the affected by her smoking. She understands this and knows she needs to quit, I do still think she has the will power this point in time to do so. Perhaps showing progression of disease in the peripheral vasculature will help pressure the direction to fully stop smoking. I did spend at least 4-5 minutes in direct counseling on the subject.

## 2013-03-06 NOTE — Assessment & Plan Note (Signed)
On increased dose of statin. Being monitored by PCP. Most recent labs from 2012 are noted in the computer and the relatively well-controlled. It unfortunately do not have labs since then. She says they're due to be checked by her PCP soon.

## 2013-03-06 NOTE — Assessment & Plan Note (Signed)
On full dose regimen as noted above. I do think that her overall symptoms have improved. She is on a very good regimen. Her stent was dilated up to 3 mm which would reduce the likelihood of significant in-stent restenosis. She has been on dual antiplatelet ever since was placed.

## 2013-03-06 NOTE — Assessment & Plan Note (Addendum)
Rechecking LEA Dopplers.  Anticipate referral to Dr. Gwenlyn Found for peripheral angiography.  I did recommend that she continue walking to the best of her bili. This will help both her cardiac and peripheral vascular standpoint.

## 2013-03-06 NOTE — Assessment & Plan Note (Signed)
Currently well-controlled on current medications. She is on high-dose ARB and beta blocker along with mid range amlodipine. She has had intermittent spells of dizziness. I told her if she feels a little dizzy, she can cut the amlodipine in half for one to 2 doses.

## 2013-03-06 NOTE — Assessment & Plan Note (Signed)
Thankfully, her symptoms seem to have improved with the Imdur. I think for now, as her episodes are only exertional, and less limiting then what sounds like claudication, we'll simply continue to treat medically. She is on beta blocker, ARB along with amlodipine with statins and dual antiplatelet therapy.  Plan: Continue medical therapy for now. If her lower STEMI Dopplers proved to show progression, and there be consideration for lower extremity arterial angiography, I would very much consider coronary angiography at the same time in order to fully delineate her anatomy. If her symptoms progress or worsen now, I would have a low threshold to proceeding with cardiac catheterization.  For now I think she is stable for 6 month followup. If she has worsening symptoms she is to contact us. If her Dopplers show any abnormalities will have her see Dr. Gwenlyn Found in the interim.

## 2013-03-06 NOTE — Assessment & Plan Note (Signed)
This would need to be taken into consideration if she were to go for angiography. She would potentially need prolonged hydration pre-and post procedure.

## 2013-03-11 ENCOUNTER — Ambulatory Visit (HOSPITAL_COMMUNITY)
Admission: RE | Admit: 2013-03-11 | Discharge: 2013-03-11 | Disposition: A | Discharge status: Home / Self Care | Payer: Medicare Other | Source: Ambulatory Visit | Attending: Cardiovascular Disease | Admitting: Cardiovascular Disease

## 2013-03-11 DIAGNOSIS — I739 Peripheral vascular disease, unspecified: Secondary | ICD-10-CM | POA: Insufficient documentation

## 2013-03-11 NOTE — Progress Notes (Signed)
Lower Extremity Arterial Duplex Completed. °Brianna L Mazza,RVT °

## 2013-03-20 ENCOUNTER — Telehealth: Payer: Self-pay | Admitting: *Deleted

## 2013-03-20 NOTE — Telephone Encounter (Signed)
Message copied by Raiford Simmonds on Fri Mar 20, 2013 11:58 AM ------      Message from: Leonie Man      Created: Fri Mar 20, 2013 11:30 AM       Can we arrange an appt with Dr. Gwenlyn Found - to determine +/- LEA Angio.  If so, would consider Coronary Angio as well -- would need pre-hydration.            Leonie Man, MD       ------

## 2013-03-20 NOTE — Telephone Encounter (Signed)
Spoke to patient. Result given . Verbalized understanding Pt is aware an appointment will be made to see Dr Gwenlyn Found -possible [facility] /POSSIBLE CARDIAC CATH. Will route to scheduler -next available

## 2013-04-09 ENCOUNTER — Other Ambulatory Visit: Payer: Self-pay | Admitting: Nephrology

## 2013-04-09 DIAGNOSIS — N183 Chronic kidney disease, stage 3 unspecified: Secondary | ICD-10-CM

## 2013-04-17 ENCOUNTER — Ambulatory Visit (INDEPENDENT_AMBULATORY_CARE_PROVIDER_SITE_OTHER): Payer: Medicare Other | Admitting: Cardiovascular Disease

## 2013-04-17 ENCOUNTER — Encounter: Payer: Self-pay | Admitting: Cardiovascular Disease

## 2013-04-17 ENCOUNTER — Ambulatory Visit
Admission: RE | Admit: 2013-04-17 | Discharge: 2013-04-17 | Disposition: A | Discharge status: Home / Self Care | Payer: Medicare Other | Source: Ambulatory Visit | Attending: Nephrology | Admitting: Nephrology

## 2013-04-17 VITALS — BP 138/82 | HR 78 | Ht 59.0 in | Wt 150.1 lb

## 2013-04-17 DIAGNOSIS — N183 Chronic kidney disease, stage 3 unspecified: Secondary | ICD-10-CM

## 2013-04-17 DIAGNOSIS — I739 Peripheral vascular disease, unspecified: Secondary | ICD-10-CM

## 2013-04-17 DIAGNOSIS — R5383 Other fatigue: Secondary | ICD-10-CM

## 2013-04-17 DIAGNOSIS — D689 Coagulation defect, unspecified: Secondary | ICD-10-CM

## 2013-04-17 DIAGNOSIS — R5381 Other malaise: Secondary | ICD-10-CM

## 2013-04-17 DIAGNOSIS — R0989 Other specified symptoms and signs involving the circulatory and respiratory systems: Secondary | ICD-10-CM

## 2013-04-17 DIAGNOSIS — Z79899 Other long term (current) drug therapy: Secondary | ICD-10-CM

## 2013-04-17 DIAGNOSIS — Z01818 Encounter for other preprocedural examination: Secondary | ICD-10-CM

## 2013-04-17 NOTE — Patient Instructions (Signed)
Dr. Gwenlyn Found has ordered a peripheral angiogram to be done at [facility].  This procedure is going to look at the bloodflow in your lower extremities.  If Dr. Gwenlyn Found is able to open up the arteries, you will have to spend one night in the hospital.  If he is not able to open the arteries, you will be able to go home that same day.    After the procedure, you will not be allowed to drive for 3 days or push, pull, or lift anything greater than 10 lbs for one week.    You will be required to have bloodwork prior to your procedure.  Our scheduler will advise you on when this item needs to be done.    Left groin stick     Dr Gwenlyn Found has also ordered for a carotid doppler to be done.

## 2013-04-17 NOTE — Progress Notes (Signed)
04/17/2013 [name]   December 24, 1945  [id number]  Primary Physician Marlou Sa, ERIC, MD Primary Cardiologist: Lorretta Harp MD Renae Gloss   HPI:  Ms. Neuhart is a 68 year old African American female accompanied by her sister today. She was referred by Dr. Ellyn Hack. She has a history of CAD, hypertension, tobacco abuse and hyperlipidemia. She does have lifestyle limiting claudication with Dopplers that showed stenoses in the right common femoral and left iliac artery. She denies chest pain or shortness of breath.   Current Outpatient Prescriptions  Medication Sig Dispense Refill  . albuterol (PROVENTIL HFA;VENTOLIN HFA) 108 (90 BASE) MCG/ACT inhaler Inhale 2 puffs into the lungs every 4 (four) hours as needed. For shortness of pain      . amLODipine (NORVASC) 5 MG tablet Take 5 mg by mouth daily.      Marland Kitchen aspirin EC 81 MG tablet Take 81 mg by mouth daily.      Marland Kitchen atorvastatin (LIPITOR) 40 MG tablet Take 40 mg by mouth daily.      . calcium-vitamin D (OYSTER CALCIUM 500 + D) 500-200 MG-UNIT per tablet Take 1 tablet by mouth.      . clopidogrel (PLAVIX) 75 MG tablet Take 75 mg by mouth daily.      . famotidine (PEPCID) 40 MG tablet Take 40 mg by mouth 2 (two) times daily.      . ferrous sulfate 325 (65 FE) MG EC tablet Take 325 mg by mouth daily with breakfast.      . Fluticasone-Salmeterol (ADVAIR) 100-50 MCG/DOSE AEPB Inhale 1 puff into the lungs 2 (two) times daily as needed (shortness of breath).      . hydrochlorothiazide (HYDRODIURIL) 25 MG tablet Take 25 mg by mouth daily.      . isosorbide mononitrate (IMDUR) 30 MG 24 hr tablet Take 1 tablet (30 mg total) by mouth daily.  30 tablet  11  . loratadine (CLARITIN) 10 MG tablet Take 10 mg by mouth daily as needed for allergies.      . metoprolol succinate (TOPROL-XL) 100 MG 24 hr tablet Take 150 mg by mouth daily. Take with or immediately following a meal.      . nitroGLYCERIN (NITROSTAT) 0.4 MG SL tablet Place 1 tablet (0.4  mg total) under the tongue every 5 (five) minutes as needed for chest pain.  25 tablet  6  . traZODone (DESYREL) 50 MG tablet Take 50-100 mg by mouth at bedtime as needed for sleep.       No current facility-administered medications for this visit.    No Known Allergies  History   Social History  . Marital Status: Married    Spouse Name: N/A    Number of Children: 1  . Years of Education: N/A   Occupational History  .     Social History Main Topics  . Smoking status: Current Every Day Smoker -- 0.30 packs/day for 35 years    Types: Cigarettes  . Smokeless tobacco: Never Used  . Alcohol Use: No  . Drug Use: No  . Sexual Activity: Not Currently    Partners: Male    Birth Control/ Protection: None   Other Topics Concern  . Not on file   Social History Narrative   Married. Has one child. Grandmother for a great-grandmother and 1.   No real exercise.   Currently smokes 7 or less alcohol today (sometimes she says they more chest burn out of the ashtray).   Denies alcohol consumption.  Review of Systems: General: negative for chills, fever, night sweats or weight changes.  Cardiovascular: negative for chest pain, dyspnea on exertion, edema, orthopnea, palpitations, paroxysmal nocturnal dyspnea or shortness of breath Dermatological: negative for rash Respiratory: negative for cough or wheezing Urologic: negative for hematuria Abdominal: negative for nausea, vomiting, diarrhea, bright red blood per rectum, melena, or hematemesis Neurologic: negative for visual changes, syncope, or dizziness All other systems reviewed and are otherwise negative except as noted above.    Blood pressure 138/82, pulse 78, height 4\' 11"  (1.499 m), weight 150 lb 1.6 oz (68.085 kg).  General appearance: alert and no distress Neck: no adenopathy, no JVD, supple, symmetrical, trachea midline, thyroid not enlarged, symmetric, no tenderness/mass/nodules and soft left carotid bruit Lungs: clear to  auscultation bilaterally Heart: regular rate and rhythm, S1, S2 normal, no murmur, click, rub or gallop Extremities: extremities normal, atraumatic, no cyanosis or edema and 2+ femorals without bruits, 1+ pedal pulses  EKG not performed today  ASSESSMENT AND PLAN:   PAD (peripheral artery disease) - bilateral CIA & CFA 50-69%; Claudication The patient was referred to me by Dr. [name] for lifestyle limiting claudication. She does have a history of CAD status post non-STEMI in 2009 2 with RCA stenting by Dr. Megan Salon with a DES stent. Recent lower extremity arterial Doppler studies performed in our office 03/11/13 revealed a right ABI of 1 with high-frequency signal in her distal right common femoral artery and a left ABI 0.76 with high-grade disease in the left iliac artery. The plan is to perform angiography and potential endovascular therapy.      Lorretta Harp MD FACP,FACC,FAHA, [facility] 04/17/2013 1:59 PM

## 2013-04-17 NOTE — Assessment & Plan Note (Signed)
The patient was referred to me by Dr. [name] for lifestyle limiting claudication. She does have a history of CAD status post non-STEMI in 2009 2 with RCA stenting by Dr. Megan Salon with a DES stent. Recent lower extremity arterial Doppler studies performed in our office 03/11/13 revealed a right ABI of 1 with high-frequency signal in her distal right common femoral artery and a left ABI 0.76 with high-grade disease in the left iliac artery. The plan is to perform angiography and potential endovascular therapy.

## 2013-04-20 ENCOUNTER — Other Ambulatory Visit: Payer: Self-pay | Admitting: Nephrology

## 2013-04-22 ENCOUNTER — Ambulatory Visit (HOSPITAL_COMMUNITY)
Admission: RE | Admit: 2013-04-22 | Discharge: 2013-04-22 | Disposition: A | Discharge status: Home / Self Care | Payer: Medicare Other | Source: Ambulatory Visit | Attending: Cardiovascular Disease | Admitting: Cardiovascular Disease

## 2013-04-22 DIAGNOSIS — R0989 Other specified symptoms and signs involving the circulatory and respiratory systems: Secondary | ICD-10-CM | POA: Insufficient documentation

## 2013-04-22 NOTE — Progress Notes (Signed)
Carotid Duplex Completed. °Brianna L Mazza,RVT °

## 2013-04-24 ENCOUNTER — Encounter (HOSPITAL_COMMUNITY): Payer: Self-pay | Admitting: Pharmacy Technician

## 2013-04-28 LAB — APTT: aPTT: 34 seconds (ref 24–37)

## 2013-04-28 LAB — PROTIME-INR
INR: 1.01 (ref <1.50)
Prothrombin Time: 13.2 seconds (ref 11.6–15.2)

## 2013-04-29 ENCOUNTER — Telehealth: Payer: Self-pay | Admitting: *Deleted

## 2013-04-29 DIAGNOSIS — I6529 Occlusion and stenosis of unspecified carotid artery: Secondary | ICD-10-CM

## 2013-04-29 LAB — BASIC METABOLIC PANEL
BUN: 16 mg/dL (ref 6–23)
CO2: 25 mEq/L (ref 19–32)
CREATININE: 1.11 mg/dL — AB (ref 0.50–1.10)
Calcium: 9.4 mg/dL (ref 8.4–10.5)
Chloride: 105 mEq/L (ref 96–112)
GLUCOSE: 87 mg/dL (ref 70–99)
POTASSIUM: 4.5 meq/L (ref 3.5–5.3)
Sodium: 140 mEq/L (ref 135–145)

## 2013-04-29 LAB — CBC
HEMATOCRIT: 34.8 % — AB (ref 36.0–46.0)
HEMOGLOBIN: 11 g/dL — AB (ref 12.0–15.0)
MCH: 24.7 pg — AB (ref 26.0–34.0)
MCHC: 31.6 g/dL (ref 30.0–36.0)
MCV: 78 fL (ref 78.0–100.0)
Platelets: 267 10*3/uL (ref 150–400)
RBC: 4.46 MIL/uL (ref 3.87–5.11)
RDW: 17.4 % — ABNORMAL HIGH (ref 11.5–15.5)
WBC: 7.2 10*3/uL (ref 4.0–10.5)

## 2013-04-29 LAB — TSH: TSH: 2.161 u[IU]/mL (ref 0.350–4.500)

## 2013-04-29 NOTE — Telephone Encounter (Signed)
Order placed for repeat carotid doppler in 1 year 

## 2013-04-29 NOTE — Telephone Encounter (Signed)
Message copied by Chauncy Lean on Wed Apr 29, 2013 12:40 PM ------      Message from: Lorretta Harp      Created: Wed Apr 29, 2013  5:37 AM       Moderate RICA stenosis. Repeat 12 months ------

## 2013-05-04 ENCOUNTER — Ambulatory Visit (HOSPITAL_COMMUNITY)
Admission: RE | Admit: 2013-05-04 | Discharge: 2013-05-05 | Disposition: A | Discharge status: Home / Self Care | Payer: Medicare Other | Source: Ambulatory Visit | Attending: Cardiovascular Disease | Admitting: Cardiovascular Disease

## 2013-05-04 ENCOUNTER — Encounter (HOSPITAL_COMMUNITY)
Admission: RE | Disposition: A | Discharge status: Home / Self Care | Payer: Self-pay | Source: Ambulatory Visit | Attending: Cardiovascular Disease

## 2013-05-04 ENCOUNTER — Encounter (HOSPITAL_COMMUNITY): Payer: Self-pay | Admitting: General Practice

## 2013-05-04 DIAGNOSIS — D649 Anemia, unspecified: Secondary | ICD-10-CM | POA: Insufficient documentation

## 2013-05-04 DIAGNOSIS — I6529 Occlusion and stenosis of unspecified carotid artery: Secondary | ICD-10-CM | POA: Insufficient documentation

## 2013-05-04 DIAGNOSIS — N179 Acute kidney failure, unspecified: Secondary | ICD-10-CM | POA: Insufficient documentation

## 2013-05-04 DIAGNOSIS — I2510 Atherosclerotic heart disease of native coronary artery without angina pectoris: Secondary | ICD-10-CM | POA: Insufficient documentation

## 2013-05-04 DIAGNOSIS — I708 Atherosclerosis of other arteries: Secondary | ICD-10-CM | POA: Insufficient documentation

## 2013-05-04 DIAGNOSIS — J45909 Unspecified asthma, uncomplicated: Secondary | ICD-10-CM | POA: Insufficient documentation

## 2013-05-04 DIAGNOSIS — K219 Gastro-esophageal reflux disease without esophagitis: Secondary | ICD-10-CM | POA: Insufficient documentation

## 2013-05-04 DIAGNOSIS — Z01818 Encounter for other preprocedural examination: Secondary | ICD-10-CM

## 2013-05-04 DIAGNOSIS — M549 Dorsalgia, unspecified: Secondary | ICD-10-CM | POA: Insufficient documentation

## 2013-05-04 DIAGNOSIS — I10 Essential (primary) hypertension: Secondary | ICD-10-CM

## 2013-05-04 DIAGNOSIS — E785 Hyperlipidemia, unspecified: Secondary | ICD-10-CM | POA: Insufficient documentation

## 2013-05-04 DIAGNOSIS — I129 Hypertensive chronic kidney disease with stage 1 through stage 4 chronic kidney disease, or unspecified chronic kidney disease: Secondary | ICD-10-CM | POA: Insufficient documentation

## 2013-05-04 DIAGNOSIS — G8929 Other chronic pain: Secondary | ICD-10-CM | POA: Insufficient documentation

## 2013-05-04 DIAGNOSIS — I739 Peripheral vascular disease, unspecified: Secondary | ICD-10-CM | POA: Insufficient documentation

## 2013-05-04 DIAGNOSIS — Z9861 Coronary angioplasty status: Secondary | ICD-10-CM | POA: Insufficient documentation

## 2013-05-04 DIAGNOSIS — G4700 Insomnia, unspecified: Secondary | ICD-10-CM | POA: Insufficient documentation

## 2013-05-04 DIAGNOSIS — N182 Chronic kidney disease, stage 2 (mild): Secondary | ICD-10-CM | POA: Insufficient documentation

## 2013-05-04 DIAGNOSIS — I252 Old myocardial infarction: Secondary | ICD-10-CM | POA: Insufficient documentation

## 2013-05-04 DIAGNOSIS — I70219 Atherosclerosis of native arteries of extremities with intermittent claudication, unspecified extremity: Secondary | ICD-10-CM

## 2013-05-04 DIAGNOSIS — F17200 Nicotine dependence, unspecified, uncomplicated: Secondary | ICD-10-CM | POA: Insufficient documentation

## 2013-05-04 HISTORY — PX: ILIAC ARTERY STENT: SHX1786

## 2013-05-04 HISTORY — DX: Peripheral vascular disease, unspecified: I73.9

## 2013-05-04 HISTORY — DX: Disorder of arteries and arterioles, unspecified: I77.9

## 2013-05-04 HISTORY — DX: Tobacco use: Z72.0

## 2013-05-04 HISTORY — DX: Chronic kidney disease, stage 3 unspecified: N18.30

## 2013-05-04 HISTORY — DX: Chronic kidney disease, stage 2 (mild): N18.2

## 2013-05-04 HISTORY — PX: LOWER EXTREMITY ANGIOGRAM: SHX5508

## 2013-05-04 LAB — POCT ACTIVATED CLOTTING TIME
ACTIVATED CLOTTING TIME: 171 s
Activated Clotting Time: 238 seconds

## 2013-05-04 SURGERY — ANGIOGRAM, LOWER EXTREMITY
Anesthesia: LOCAL | Laterality: Left

## 2013-05-04 MED ORDER — METOPROLOL SUCCINATE ER 50 MG PO TB24
150.0000 mg | ORAL_TABLET | Freq: Every day | ORAL | Status: DC
  Administered 2013-05-04 – 2013-05-05 (×2): 150 mg via ORAL
  Filled 2013-05-04 (×2): qty 1

## 2013-05-04 MED ORDER — TRAZODONE HCL 50 MG PO TABS
50.0000 mg | ORAL_TABLET | Freq: Every evening | ORAL | Status: DC | PRN
  Filled 2013-05-04: qty 2

## 2013-05-04 MED ORDER — ISOSORBIDE MONONITRATE ER 30 MG PO TB24
30.0000 mg | ORAL_TABLET | Freq: Every day | ORAL | Status: DC
  Administered 2013-05-04 – 2013-05-05 (×2): 30 mg via ORAL
  Filled 2013-05-04 (×2): qty 1

## 2013-05-04 MED ORDER — CLOPIDOGREL BISULFATE 75 MG PO TABS
75.0000 mg | ORAL_TABLET | Freq: Every day | ORAL | Status: DC
  Administered 2013-05-05: 09:00:00 75 mg via ORAL
  Filled 2013-05-04: qty 1

## 2013-05-04 MED ORDER — MOMETASONE FURO-FORMOTEROL FUM 100-5 MCG/ACT IN AERO
2.0000 | INHALATION_SPRAY | Freq: Two times a day (BID) | RESPIRATORY_TRACT | Status: DC
  Administered 2013-05-04 – 2013-05-05 (×2): 2 via RESPIRATORY_TRACT
  Filled 2013-05-04: qty 8.8

## 2013-05-04 MED ORDER — AMLODIPINE BESYLATE 5 MG PO TABS
5.0000 mg | ORAL_TABLET | Freq: Every day | ORAL | Status: DC
  Administered 2013-05-04 – 2013-05-05 (×2): 5 mg via ORAL
  Filled 2013-05-04 (×2): qty 1

## 2013-05-04 MED ORDER — LIDOCAINE HCL (PF) 1 % IJ SOLN
INTRAMUSCULAR | Status: AC
  Filled 2013-05-04: qty 30

## 2013-05-04 MED ORDER — HYDROCHLOROTHIAZIDE 25 MG PO TABS
25.0000 mg | ORAL_TABLET | Freq: Every day | ORAL | Status: DC
  Administered 2013-05-04: 25 mg via ORAL
  Filled 2013-05-04 (×2): qty 1

## 2013-05-04 MED ORDER — ACETAMINOPHEN 325 MG PO TABS
650.0000 mg | ORAL_TABLET | ORAL | Status: DC | PRN
  Administered 2013-05-05: 04:00:00 650 mg via ORAL
  Filled 2013-05-04: qty 2

## 2013-05-04 MED ORDER — DIAZEPAM 5 MG PO TABS
5.0000 mg | ORAL_TABLET | ORAL | Status: AC
  Administered 2013-05-04: 5 mg via ORAL
  Filled 2013-05-04: qty 1

## 2013-05-04 MED ORDER — NITROGLYCERIN 0.2 MG/ML ON CALL CATH LAB
INTRAVENOUS | Status: AC
Start: 2013-05-04 — End: 2013-05-04
  Filled 2013-05-04: qty 1

## 2013-05-04 MED ORDER — NITROGLYCERIN 0.4 MG SL SUBL: 0.4000 mg | SUBLINGUAL_TABLET | SUBLINGUAL | Status: DC | PRN

## 2013-05-04 MED ORDER — ASPIRIN EC 325 MG PO TBEC: 325.0000 mg | DELAYED_RELEASE_TABLET | Freq: Every day | ORAL | Status: DC

## 2013-05-04 MED ORDER — SODIUM CHLORIDE 0.9 % IV SOLN
INTRAVENOUS | Status: AC
  Administered 2013-05-04: 15:00:00 via INTRAVENOUS

## 2013-05-04 MED ORDER — MIDAZOLAM HCL 2 MG/2ML IJ SOLN
INTRAMUSCULAR | Status: AC
  Filled 2013-05-04: qty 2

## 2013-05-04 MED ORDER — SODIUM CHLORIDE 0.9 % IJ SOLN
3.0000 mL | INTRAMUSCULAR | Status: DC | PRN
  Administered 2013-05-04: 3 mL via INTRAVENOUS

## 2013-05-04 MED ORDER — ALBUTEROL SULFATE (2.5 MG/3ML) 0.083% IN NEBU: 3.0000 mL | INHALATION_SOLUTION | RESPIRATORY_TRACT | Status: DC | PRN

## 2013-05-04 MED ORDER — ONDANSETRON HCL 4 MG/2ML IJ SOLN: 4.0000 mg | Freq: Four times a day (QID) | INTRAMUSCULAR | Status: DC | PRN

## 2013-05-04 MED ORDER — LORATADINE 10 MG PO TABS
10.0000 mg | ORAL_TABLET | Freq: Every day | ORAL | Status: DC | PRN
  Filled 2013-05-04: qty 1

## 2013-05-04 MED ORDER — SODIUM CHLORIDE 0.9 % IV SOLN
INTRAVENOUS | Status: DC
  Administered 2013-05-04: 11:00:00 via INTRAVENOUS

## 2013-05-04 MED ORDER — FAMOTIDINE 20 MG PO TABS
20.0000 mg | ORAL_TABLET | Freq: Two times a day (BID) | ORAL | Status: DC
  Administered 2013-05-04 – 2013-05-05 (×3): 20 mg via ORAL
  Filled 2013-05-04 (×4): qty 1

## 2013-05-04 MED ORDER — HYDRALAZINE HCL 20 MG/ML IJ SOLN
10.0000 mg | INTRAMUSCULAR | Status: DC | PRN
  Administered 2013-05-04: 16:00:00 10 mg via INTRAVENOUS
  Filled 2013-05-04: qty 1

## 2013-05-04 MED ORDER — FENTANYL CITRATE 0.05 MG/ML IJ SOLN
INTRAMUSCULAR | Status: AC
  Filled 2013-05-04: qty 2

## 2013-05-04 MED ORDER — ATORVASTATIN CALCIUM 40 MG PO TABS
40.0000 mg | ORAL_TABLET | Freq: Every evening | ORAL | Status: DC
  Administered 2013-05-04: 18:00:00 40 mg via ORAL
  Filled 2013-05-04 (×2): qty 1

## 2013-05-04 MED ORDER — ASPIRIN 81 MG PO CHEW: 81.0000 mg | CHEWABLE_TABLET | ORAL | Status: DC

## 2013-05-04 MED ORDER — ASPIRIN EC 81 MG PO TBEC
81.0000 mg | DELAYED_RELEASE_TABLET | Freq: Every day | ORAL | Status: DC
  Administered 2013-05-05: 81 mg via ORAL
  Filled 2013-05-04: qty 1

## 2013-05-04 MED ORDER — HEPARIN SODIUM (PORCINE) 1000 UNIT/ML IJ SOLN
INTRAMUSCULAR | Status: AC
  Filled 2013-05-04: qty 1

## 2013-05-04 MED ORDER — HEPARIN (PORCINE) IN NACL 2-0.9 UNIT/ML-% IJ SOLN
INTRAMUSCULAR | Status: AC
Start: 2013-05-04 — End: 2013-05-04
  Filled 2013-05-04: qty 1000

## 2013-05-04 MED ORDER — CLOPIDOGREL BISULFATE 75 MG PO TABS: 75.0000 mg | ORAL_TABLET | Freq: Every day | ORAL | Status: DC

## 2013-05-04 MED ORDER — FAMOTIDINE 40 MG PO TABS: 40.0000 mg | ORAL_TABLET | Freq: Two times a day (BID) | ORAL | Status: DC

## 2013-05-04 NOTE — CV Procedure (Addendum)
Kayla Mcclure is a 68 y.o. female    [id number] LOCATION:  FACILITY: [facility]  PHYSICIAN: Quay Burow, M.D. December 08, 1945   DATE OF PROCEDURE:  05/04/2013  DATE OF DISCHARGE:     PV Angiogram/Intervention    History obtained from chart review.Kayla Mcclure is a 68 year old African American female accompanied by Kayla Mcclure today. She was referred by Dr. Ellyn Hack. She has a history of CAD, hypertension, tobacco abuse and hyperlipidemia. She does have lifestyle limiting claudication with Dopplers that showed stenoses in the right common femoral and left iliac artery. She denies chest pain or shortness of breath.    PROCEDURE DESCRIPTION:   The patient was brought to the second floor [facility] lab in the postabsorptive state. She was premedicated with Valium 5 mg by mouth, IV Versed and fentanyl. Kayla left groin was prepped and shaved in usual sterile fashion. Xylocaine 1% was used for local anesthesia. A 5 French sheath was inserted into the left common femoral artery using standard Seldinger technique.a 5 French pigtail catheters placed in the mid mid abdominal aorta. Abdominal aortography, alert iliac angiography and bifemoral run was performed using bolus chase digital subtraction spelled table technique. Contralateral access obtained with a crossover catheter and then told to perform selective angiography of the distal right SFA profunda SFA trifurcation. Visipaque dye was used to the entirety of the case. Retrograde aortic pressure was monitored during the case.   HEMODYNAMICS:    AO SYSTOLIC/AO DIASTOLIC: [id number]   Angiographic Data:   1: Abdominal aortogram-renal arteries were normal. The distal abdominal aorta was normal as well.  2: Left lower extremity-70% distal left common iliac artery stenosis with a 50 mm gradient demonstrated after administration of intra-arterial nitroglycerin and pullback with an endhole catheter. There was three-vessel runoff.  3: Right lower  extremity-normal with three-vessel runoff  .  IMPRESSION:Kayla Mcclure has a physiologically significant distal left common iliac artery stenosis with lifestyle limiting claudication. We will proceed with PTA and stenting using a Nitinol self expanding stent.  Procedure Description:the 5 French sheath was exchanged over an 035 wire for a 6 French Bright Tip  Sheath.the patient received 5000 units of heparin intravenously with an ACT of 238. Total contrast administered the patient was 185 cc. The iliac artery was primarily stented 10 mm x 6 cm with an Abbott Absolute Pro  Nitinol self-expanding stent.postdilatation was performed with a 7 mm x 4 cm balloon at nominal pressures resulting in reduction of a 75% distal left common iliac artery stenosis to 0% residual. The patient tolerated the procedure well. The long 6 French sheath was then exchanged for a short 6 [place] sheath. The patient left the Cath Lab in stable condition.  Final Impression: successful PTA and stenting of a high-grade left common iliac artery stenosis with a Nitinol self  expanding stent. The patient was already on aspirin and Plavix. The sheath will be removed once the [facility] and 70 pressure will be held on the groin to achieve hemostasis. This will be hydrated overnight, discharged home in the morning. She'll obtain outpatient Doppler studies and will see me back after that.    Kayla Harp MD, [facility] 05/04/2013 2:27 PM

## 2013-05-04 NOTE — Progress Notes (Signed)
Site area: left groin  Site Prior to Removal:  Level 0  Pressure Applied For 20 MINUTES    Minutes Beginning at 1620  Manual:   yes  Patient Status During Pull:  stable  Post Pull Groin Site:  Level 0  Post Pull Instructions Given:  yes  Post Pull Pulses Present:  yes  Dressing Applied:  yes  Comments:

## 2013-05-05 ENCOUNTER — Other Ambulatory Visit: Payer: Self-pay | Admitting: Physician Assistant

## 2013-05-05 ENCOUNTER — Encounter (HOSPITAL_COMMUNITY): Payer: Self-pay | Admitting: Physician Assistant

## 2013-05-05 DIAGNOSIS — D649 Anemia, unspecified: Secondary | ICD-10-CM

## 2013-05-05 DIAGNOSIS — I739 Peripheral vascular disease, unspecified: Secondary | ICD-10-CM

## 2013-05-05 DIAGNOSIS — N182 Chronic kidney disease, stage 2 (mild): Secondary | ICD-10-CM

## 2013-05-05 DIAGNOSIS — F17200 Nicotine dependence, unspecified, uncomplicated: Secondary | ICD-10-CM

## 2013-05-05 DIAGNOSIS — I10 Essential (primary) hypertension: Secondary | ICD-10-CM

## 2013-05-05 DIAGNOSIS — D62 Acute posthemorrhagic anemia: Secondary | ICD-10-CM

## 2013-05-05 LAB — BASIC METABOLIC PANEL
BUN: 17 mg/dL (ref 6–23)
CO2: 19 meq/L (ref 19–32)
CREATININE: 1.4 mg/dL — AB (ref 0.50–1.10)
Calcium: 8.7 mg/dL (ref 8.4–10.5)
Chloride: 101 mEq/L (ref 96–112)
GFR calc Af Amer: 44 mL/min — ABNORMAL LOW (ref >90)
GFR calc non Af Amer: 38 mL/min — ABNORMAL LOW (ref >90)
GLUCOSE: 94 mg/dL (ref 70–99)
Potassium: 4.1 mEq/L (ref 3.7–5.3)
SODIUM: 135 meq/L — AB (ref 137–147)

## 2013-05-05 LAB — CBC
HCT: 29.7 % — ABNORMAL LOW (ref 36.0–46.0)
Hemoglobin: 9.5 g/dL — ABNORMAL LOW (ref 12.0–15.0)
MCH: 24.9 pg — AB (ref 26.0–34.0)
MCHC: 32 g/dL (ref 30.0–36.0)
MCV: 78 fL (ref 78.0–100.0)
Platelets: 196 10*3/uL (ref 150–400)
RBC: 3.81 MIL/uL — ABNORMAL LOW (ref 3.87–5.11)
RDW: 16.1 % — AB (ref 11.5–15.5)
WBC: 6 10*3/uL (ref 4.0–10.5)

## 2013-05-05 NOTE — Discharge Summary (Addendum)
Discharge Summary   Patient ID: Kayla Mcclure MRN: [id number], DOB/AGE: November 29, 1945 68 y.o. Admit date: 05/04/2013 D/C date:     05/05/2013  Primary Care Provider: Kevan Ny, MD Primary Cardiologist: Jenel Lucks - Gwenlyn Found  Primary Discharge Diagnoses:  1. Peripheral arterial disease  - s/p PTA and stenting of L CIA stenosis this admission 2. HTN, controlled 3. Tobacco abuse, ongoing 4. Hyperlipidemia 5. CAD with history of NSTEMI 2009 s/p RCA stenting, nonischemic perfusion by nuc 05/2012 6. Anemia (h/o IDA) 7. CKD stage II with AKI - HCTZ held, DC Cr 1.40 8. Carotid artery disease 50-69% RICA by outpt dopp 1/21, f/u 04/2014  Secondary Discharge Diagnoses:  1. Internal hemorrhoids by colonoscopy 04/2010 2. Diverticulosis by colonoscopy 04/2010 3. Chronic insomnia 4. Asthma 5. Chronic back pain 6. GERD  Hospital Course: Kayla Mcclure is a 68 y/o F with history of CAD (NSTEMI 2009 s/p DES to RCA), ongoing tobacco abuse, HL, HTN, recently dx CKD stage II, carotid artery disease (50-69% by dopp 04/22/13) who was recently referred to [facility] for evaluation of claudication. She was reporting lifestyle-limiting claudication with Dopplers that showed stenoses in the right common femoral and left iliac artery. She underwent PV angio on 05/04/13 which showed physiologically significant distal left common iliac artery stenosis with lifestyle limiting claudication. This was treated with PTA and stenting using a Nitinol self expanding stent. She remains on ASA and Plavix per prior regimen. She was hydrated overnight given CKD. Cr did elevate to 1.40 post-cath and Hgb 9.5 (outpt = 11 range) without any evidence for bleeding. She remains on oral iron. Given that she is feeling well, will recheck these in 2 days as outpatient. We held HCTZ in the meantime. If Cr has improved on Thurs, would consider resuming. BP was 115-124 this morning before medicines. We will plan outpatient dopplers then f/u with Dr. Gwenlyn Found. She  was asked to f/u PCP for her anemia as well. She was repeatedly counseled on tobacco cessation. Dr. Irish Lack has seen and examined the patient today and feels she is stable for discharge.  I have left a message on our office's scheduling voicemail requesting her OP dopplers, labs, & follow-up appointment, and our office will call the patient with these.   Discharge Vitals: Blood pressure 124/53, pulse 73, temperature 98.5 F (36.9 C), temperature source Oral, resp. rate 18, height 4\' 11"  (1.499 m), weight 151 lb 0.2 oz (68.5 kg), SpO2 94.00%.  Labs: Lab Results  Component Value Date   WBC 6.0 05/05/2013   HGB 9.5* 05/05/2013   HCT 29.7* 05/05/2013   MCV 78.0 05/05/2013   PLT 196 05/05/2013     Recent Labs Lab 05/05/13 0545  NA 135*  K 4.1  CL 101  CO2 19  BUN 17  CREATININE 1.40*  CALCIUM 8.7  GLUCOSE 94    Lab Results  Component Value Date   CHOL 149 06/13/2010   HDL 47 06/13/2010   LDLCALC 82 06/13/2010   TRIG 98 06/13/2010    Diagnostic Studies/Procedures   US Renal 04/17/2013   CLINICAL DATA:  Stage III chronic kidney disease.  EXAM: RENAL/URINARY TRACT ULTRASOUND COMPLETE  COMPARISON:  08/20/2008  FINDINGS: Right Kidney:  Length: 8.8 cm. Slight increased echogenicity of the renal parenchyma consistent with renal medical disease. No obstruction.  Left Kidney:  Length: 9.2 cm. Increased echogenicity of the renal parenchyma. 7 mm cyst on the lower pole. No obstruction.  Bladder:  Appears normal for degree of bladder distention.  IMPRESSION:  Echogenic renal parenchyma consistent with renal medical disease.   Electronically Signed   By: Rozetta Nunnery M.D.   On: 04/17/2013 15:45   PV angio 05/04/13 History obtained from chart review.Kayla Mcclure is a 68 year old African American female accompanied by her sister today. She was referred by Dr. Ellyn Hack. She has a history of CAD, hypertension, tobacco abuse and hyperlipidemia. She does have lifestyle limiting claudication with Dopplers that showed  stenoses in the right common femoral and left iliac artery. She denies chest pain or shortness of breath.  PROCEDURE DESCRIPTION:  The patient was brought to the second floor [facility] cath lab in the postabsorptive state. She was premedicated with Valium 5 mg by mouth, IV Versed and fentanyl. Her left groin  was prepped and shaved in usual sterile fashion. Xylocaine 1% was used for local anesthesia. A 5 French sheath was inserted into the left common femoral artery using standard Seldinger technique.a 5 French pigtail catheters placed in the mid mid abdominal aorta. Abdominal aortography, alert iliac angiography and bifemoral run was performed using bolus chase digital subtraction spelled table technique. Contralateral access obtained with a crossover catheter and then told to perform selective angiography of the distal right SFA profunda SFA trifurcation. Visipaque dye was used to the entirety of the case. Retrograde aortic pressure was monitored during the case.  HEMODYNAMICS:  AO SYSTOLIC/AO DIASTOLIC: [id number]  Angiographic Data:  1: Abdominal aortogram-renal arteries were normal. The distal abdominal aorta was normal as well.  2: Left lower extremity-70% distal left common iliac artery stenosis with a 50 mm gradient demonstrated after administration of intra-arterial nitroglycerin and pullback with an endhole catheter. There was three-vessel runoff.  3: Right lower extremity-normal with three-vessel runoff .  IMPRESSION:Ms. Arkell has a physiologically significant distal left common iliac artery stenosis with lifestyle limiting claudication. We will proceed with PTA and stenting using a Nitinol self expanding stent.  Procedure Description:the 5 French sheath was exchanged over an 035 wire for a 6 French Bright Tip Sheath.the patient received 5000 units of heparin intravenously with an ACT of 238. Total contrast administered the patient was 185 cc. The iliac artery was primarily stented 10 mm  x 6 cm with an Abbott Absolute Pro Nitinol self-expanding stent.postdilatation was performed with a 7 mm x 4 cm balloon at nominal pressures resulting in reduction of a 75% distal left common iliac artery stenosis to 0% residual. The patient tolerated the procedure well. The long 6 French sheath was then exchanged for a short 6 [place] sheath. The patient left the Cath Lab in stable condition.  Final Impression: successful PTA and stenting of a high-grade left common iliac artery stenosis with a Nitinol self expanding stent. The patient was already on aspirin and Plavix. The sheath will be removed once the [facility] and 70 pressure will be held on the groin to achieve hemostasis. This will be hydrated overnight, discharged home in the morning. She'll obtain outpatient Doppler studies and will see me back after that.  Lorretta Harp MD, [facility]   Discharge Medications     Medication List    STOP taking these medications       hydrochlorothiazide 25 MG tablet  Commonly known as:  HYDRODIURIL      TAKE these medications       albuterol 108 (90 BASE) MCG/ACT inhaler  Commonly known as:  PROVENTIL HFA;VENTOLIN HFA  Inhale 2 puffs into the lungs every 4 (four) hours as needed. For shortness of pain  amLODipine 5 MG tablet  Commonly known as:  NORVASC  Take 5 mg by mouth daily.     aspirin EC 81 MG tablet  Take 81 mg by mouth daily.     atorvastatin 40 MG tablet  Commonly known as:  LIPITOR  Take 40 mg by mouth daily.     clopidogrel 75 MG tablet  Commonly known as:  PLAVIX  Take 75 mg by mouth daily.     famotidine 40 MG tablet  Commonly known as:  PEPCID  Take 40 mg by mouth 2 (two) times daily.     ferrous sulfate 325 (65 FE) MG EC tablet  Take 325 mg by mouth daily with breakfast.     Fluticasone-Salmeterol 100-50 MCG/DOSE Aepb  Commonly known as:  ADVAIR  Inhale 1 puff into the lungs 2 (two) times daily as needed (shortness of breath).     isosorbide mononitrate 30  MG 24 hr tablet  Commonly known as:  IMDUR  Take 1 tablet (30 mg total) by mouth daily.     loratadine 10 MG tablet  Commonly known as:  CLARITIN  Take 10 mg by mouth daily as needed for allergies.     metoprolol succinate 100 MG 24 hr tablet  Commonly known as:  TOPROL-XL  Take 150 mg by mouth daily. Take with or immediately following a meal.     nitroGLYCERIN 0.4 MG SL tablet  Commonly known as:  NITROSTAT  Place 1 tablet (0.4 mg total) under the tongue every 5 (five) minutes as needed for chest pain.     OYSTER CALCIUM 500 + D 500-200 MG-UNIT per tablet  Generic drug:  calcium-vitamin D  Take 1 tablet by mouth daily with breakfast.     traZODone 50 MG tablet  Commonly known as:  DESYREL  Take 50-100 mg by mouth at bedtime as needed for sleep.        Disposition   The patient will be discharged in stable condition to home. Discharge Orders   Future Orders Complete By Expires   Diet - low sodium heart healthy  As directed    Discharge instructions  As directed    Comments:     Your HCTZ (hydrochlorothiazide) was held because of your kidney function. When you get your labs checked, make sure to ask if you can restart this medicine when they call you with the results.   Increase activity slowly  As directed    Scheduling Instructions:     No driving for 2 days. No lifting over 5 lbs for 1 week. No sexual activity for 1 week. Keep procedure site clean & dry. If you notice increased pain, swelling, bleeding or pus, call/return!  You may shower, but no soaking baths/hot tubs/pools for 1 week.     Follow-up Information   Follow up with Lorretta Harp, MD. (Our office will call you for your followup leg ultrasound and appointment. Please call the office if you have not heard from [place] within 3 days.)    Specialty:  Cardiology   Contact information:   [address] [place] [address] [phone]       Follow up with [facility]. (Please go to [facility] lab  on Thursday 05/07/13 (bottom floor of the building your heart doctor is in) to have kidney function and blood count rechecked. You may visit anytime between 8am-4pm.)    Contact information:   [address], [id number] [place], [place] [address] [phone] (Phone)  Follow up with Marlou Sa, ERIC, MD. (Please schedule an appointment with your primary care doctor to evaluate your anemia.)    Specialty:  Internal Medicine   Contact information:   [facility] [place] [address] [phone]         Duration of Discharge Encounter: 40 minutes including physician and PA time, going over medicines and issues with anemia and renal insufficiency.  Signed, Melina Copa PA-C 05/05/2013, 9:27 AM  I have examined the patient and reviewed assessment and plan and discussed with patient.  Agree with above as stated.   Labs later this week.  PO hydration.  Anemia at baseline.  Dopplers as scheduled.  No groin hematoma.  Palpable PT pulses bilaterally.  Yolandra Habig S.

## 2013-05-05 NOTE — Progress Notes (Signed)
    Patient: Kayla Mcclure / Admit Date: 05/04/2013 / Date of Encounter: 05/05/2013, 7:17 AM   Subjective  Feeling good. No CP, SOB. Denies bleeding including BRBPR, hematemesis, hematuria. No hematoma.  Objective   Telemetry: NSR - two brief runs of atrial tach, asymptomatic  Physical Exam: Blood pressure 115/51, pulse 89, temperature 99.5 F (37.5 C), temperature source Oral, resp. rate 16, height 4\' 11"  (1.499 m), weight 151 lb 0.2 oz (68.5 kg), SpO2 94.00%. General: Well developed, well nourished AAF in no acute distress. Head: Normocephalic, atraumatic, sclera non-icteric, no xanthomas, nares are without discharge. Neck: JVP not elevated. Lungs: Occ exp wheeze, no rales or rhonchi. Breathing is unlabored. Heart: RRR S1 S2 without murmurs, rubs, or gallops.  Abdomen: Soft, non-tender, non-distended with normoactive bowel sounds. No rebound/guarding. Extremities: No clubbing or cyanosis. No edema. Distal pedal pulses are 2+ and equal bilaterally. L groin without ecchymosis, hematoma or bruit Neuro: Alert and oriented X 3. Moves all extremities spontaneously. Psych:  Responds to questions appropriately with a normal affect.   Intake/Output Summary (Last 24 hours) at 05/05/13 0717 Last data filed at 05/05/13 0400  Gross per 24 hour  Intake   1440 ml  Output   1075 ml  Net    365 ml    Inpatient Medications:  . amLODipine  5 mg Oral Daily  . aspirin EC  81 mg Oral Daily  . atorvastatin  40 mg Oral QPM  . clopidogrel  75 mg Oral Q breakfast  . famotidine  20 mg Oral BID  . hydrochlorothiazide  25 mg Oral Daily  . isosorbide mononitrate  30 mg Oral Daily  . metoprolol succinate  150 mg Oral Daily  . mometasone-formoterol  2 puff Inhalation BID   Infusions:    Labs:  Recent Labs  05/05/13 0545  NA 135*  K 4.1  CL 101  CO2 19  GLUCOSE 94  BUN 17  CREATININE 1.40*  CALCIUM 8.7    Recent Labs  05/05/13 0545  WBC 6.0  HGB 9.5*  HCT 29.7*  MCV 78.0  PLT 196     Radiology/Studies:  PV angio this admission   Assessment and Plan  1. PVD - s/p PTA and stenting of L CIA stenosis. ASA & Plavix. Outpt dopplers and f/u with Dr. Gwenlyn Found. 2. HTN - controlled. 3. Tobacco abuse - counseled. 4. Hyperlipidemia - continue statin. 5. CAD with history of NSTEMI 2009 s/p RCA stenting, nonischemic nuc 05/2012 - no ischemic sx. 6. Anemia - downtrending Hgb but denies bleeding. ? Related to CKD. Advised to f/u PCP. Repeat CBC on Thurrs. 7. Acute renal insufficiency - recent outpt Cr's 1.02-1.11, recent renal US with medical renal disease and dx of CKD stage II by PCP - f/u BM[facility] on Thurs. 8. Carotid artery disease 50-69% RICA by outpt dopp 1/21, f/u 04/2014.  Signed, Melina Copa PA-C  I have examined the patient and reviewed assessment and plan and discussed with patient.  Agree with above as stated.  Plan d/c later today.  Malakhi Markwood S.

## 2013-05-05 NOTE — H&P (Deleted)
Note entered in error Emmogene Simson PA-C

## 2013-05-07 ENCOUNTER — Other Ambulatory Visit: Payer: Self-pay | Admitting: *Deleted

## 2013-05-07 ENCOUNTER — Other Ambulatory Visit: Payer: Self-pay | Admitting: Cardiovascular Disease

## 2013-05-07 ENCOUNTER — Telehealth: Payer: Self-pay | Admitting: Cardiovascular Disease

## 2013-05-07 DIAGNOSIS — D649 Anemia, unspecified: Secondary | ICD-10-CM

## 2013-05-07 DIAGNOSIS — Z79899 Other long term (current) drug therapy: Secondary | ICD-10-CM

## 2013-05-07 DIAGNOSIS — R899 Unspecified abnormal finding in specimens from other organs, systems and tissues: Secondary | ICD-10-CM

## 2013-05-07 DIAGNOSIS — N182 Chronic kidney disease, stage 2 (mild): Secondary | ICD-10-CM

## 2013-05-07 LAB — BASIC METABOLIC PANEL
BUN: 16 mg/dL (ref 6–23)
CHLORIDE: 103 meq/L (ref 96–112)
CO2: 23 mEq/L (ref 19–32)
Calcium: 9.1 mg/dL (ref 8.4–10.5)
Creat: 1.31 mg/dL — ABNORMAL HIGH (ref 0.50–1.10)
Glucose, Bld: 97 mg/dL (ref 70–99)
POTASSIUM: 3.9 meq/L (ref 3.5–5.3)
SODIUM: 136 meq/L (ref 135–145)

## 2013-05-07 LAB — CBC
HCT: 34.3 % — ABNORMAL LOW (ref 36.0–46.0)
Hemoglobin: 10.8 g/dL — ABNORMAL LOW (ref 12.0–15.0)
MCH: 24.7 pg — ABNORMAL LOW (ref 26.0–34.0)
MCHC: 31.5 g/dL (ref 30.0–36.0)
MCV: 78.5 fL (ref 78.0–100.0)
PLATELETS: 229 10*3/uL (ref 150–400)
RBC: 4.37 MIL/uL (ref 3.87–5.11)
RDW: 17 % — ABNORMAL HIGH (ref 11.5–15.5)
WBC: 5.8 10*3/uL (ref 4.0–10.5)

## 2013-05-07 NOTE — Telephone Encounter (Signed)
Already handled by Mariann Laster, CMA.

## 2013-05-12 ENCOUNTER — Encounter: Payer: Self-pay | Admitting: *Deleted

## 2013-05-14 ENCOUNTER — Ambulatory Visit (HOSPITAL_COMMUNITY)
Admission: RE | Admit: 2013-05-14 | Discharge: 2013-05-14 | Disposition: A | Discharge status: Home / Self Care | Payer: Medicare Other | Source: Ambulatory Visit | Attending: Internal Medicine | Admitting: Internal Medicine

## 2013-05-14 DIAGNOSIS — Z9889 Other specified postprocedural states: Secondary | ICD-10-CM | POA: Insufficient documentation

## 2013-05-14 DIAGNOSIS — I708 Atherosclerosis of other arteries: Secondary | ICD-10-CM | POA: Insufficient documentation

## 2013-05-14 DIAGNOSIS — I739 Peripheral vascular disease, unspecified: Secondary | ICD-10-CM

## 2013-05-14 NOTE — Progress Notes (Signed)
Left Lower Extremity Arterial Duplex Completed. °Brianna L Mazza,RVT °

## 2013-05-19 ENCOUNTER — Ambulatory Visit: Payer: Medicare Other | Admitting: Cardiovascular Disease

## 2013-05-31 ENCOUNTER — Encounter: Payer: Self-pay | Admitting: *Deleted

## 2013-05-31 ENCOUNTER — Telehealth: Payer: Self-pay | Admitting: *Deleted

## 2013-05-31 DIAGNOSIS — I739 Peripheral vascular disease, unspecified: Secondary | ICD-10-CM

## 2013-05-31 NOTE — Telephone Encounter (Signed)
Order placed for repeat lower extremity arterial doppler in 6 months  

## 2013-05-31 NOTE — Telephone Encounter (Signed)
Message copied by Chauncy Lean on Sun May 31, 2013 10:43 PM ------      Message from: Lorretta Harp      Created: Sat May 30, 2013 11:30 AM       Improved dopplers s/p LCIA intervention. Repeat in 6 months ------

## 2013-06-23 ENCOUNTER — Ambulatory Visit (INDEPENDENT_AMBULATORY_CARE_PROVIDER_SITE_OTHER): Payer: Medicare Other | Admitting: Cardiovascular Disease

## 2013-06-23 ENCOUNTER — Encounter: Payer: Self-pay | Admitting: Cardiovascular Disease

## 2013-06-23 VITALS — BP 169/83 | HR 81 | Ht 59.0 in | Wt 150.5 lb

## 2013-06-23 DIAGNOSIS — I739 Peripheral vascular disease, unspecified: Secondary | ICD-10-CM

## 2013-06-23 NOTE — Assessment & Plan Note (Signed)
Status post PTA and stenting of the left common and external iliac artery segment and also expanding stent (10 mm x 6 cm). Followup Dopplers performed 05/14/13 were entirely normal. The patient is clinically improved. We'll continue to follow this on a semiannual basis and I will see her back when necessary.

## 2013-06-23 NOTE — Progress Notes (Signed)
06/23/2013 Kayla Mcclure   1946/02/14  [id number]  Primary Physician Marlou Sa, ERIC, MD Primary Cardiologist: Lorretta Harp MD Renae Gloss   HPI:  Kayla Mcclure is a 68 year old African American female accompanied by her sister today. She was referred by Dr. Ellyn Hack. She has a history of CAD, hypertension, tobacco abuse and hyperlipidemia. She does have lifestyle limiting claudication with Dopplers that showed stenoses in the right common femoral and left iliac artery. She denies chest pain or shortness of breath. She underwent percutaneous revascularization of her left common and external iliac artery by myself on 05/04/13 with excellent graft and clinical result. For followup Dopplers performed today there were completely normal. She currently denies claudication.    Current Outpatient Prescriptions  Medication Sig Dispense Refill  . albuterol (PROVENTIL HFA;VENTOLIN HFA) 108 (90 BASE) MCG/ACT inhaler Inhale 2 puffs into the lungs every 4 (four) hours as needed. For shortness of pain      . amLODipine (NORVASC) 5 MG tablet Take 5 mg by mouth daily.      Marland Kitchen aspirin EC 81 MG tablet Take 81 mg by mouth daily.      Marland Kitchen atorvastatin (LIPITOR) 40 MG tablet Take 40 mg by mouth daily.      . calcium-vitamin D (OYSTER CALCIUM 500 + D) 500-200 MG-UNIT per tablet Take 1 tablet by mouth daily with breakfast.       . cloNIDine (CATAPRES) 0.1 MG tablet Take 1 tablet by mouth daily.      . clopidogrel (PLAVIX) 75 MG tablet Take 75 mg by mouth daily.      . famotidine (PEPCID) 40 MG tablet Take 40 mg by mouth 2 (two) times daily.      . ferrous sulfate 325 (65 FE) MG EC tablet Take 325 mg by mouth daily with breakfast.      . Fluticasone-Salmeterol (ADVAIR) 100-50 MCG/DOSE AEPB Inhale 1 puff into the lungs 2 (two) times daily as needed (shortness of breath).      . hydrOXYzine (ATARAX/VISTARIL) 50 MG tablet Take 50 mg by mouth at bedtime as needed.      . isosorbide mononitrate (IMDUR) 30 MG 24  hr tablet Take 1 tablet (30 mg total) by mouth daily.  30 tablet  11  . loratadine (CLARITIN) 10 MG tablet Take 10 mg by mouth daily as needed for allergies.      . metoprolol succinate (TOPROL-XL) 100 MG 24 hr tablet Take 150 mg by mouth daily. Take with or immediately following a meal.      . nitroGLYCERIN (NITROSTAT) 0.4 MG SL tablet Place 1 tablet (0.4 mg total) under the tongue every 5 (five) minutes as needed for chest pain.  25 tablet  6  . ranitidine (ZANTAC) 150 MG tablet Take 2 tablets by mouth 2 (two) times daily.       No current facility-administered medications for this visit.    No Known Allergies  History   Social History  . Marital Status: Married    Spouse Name: N/A    Number of Children: 1  . Years of Education: N/A   Occupational History  .     Social History Main Topics  . Smoking status: Current Every Day Smoker -- 0.30 packs/day for 40 years    Types: Cigarettes  . Smokeless tobacco: Never Used  . Alcohol Use: No  . Drug Use: No  . Sexual Activity: No   Other Topics Concern  . Not on file   Social  History Narrative   Married. Has one child. Grandmother for a great-grandmother and 1.   No real exercise.   Currently smokes 7 or less alcohol today (sometimes she says they more chest burn out of the ashtray).   Denies alcohol consumption.     Review of Systems: General: negative for chills, fever, night sweats or weight changes.  Cardiovascular: negative for chest pain, dyspnea on exertion, edema, orthopnea, palpitations, paroxysmal nocturnal dyspnea or shortness of breath Dermatological: negative for rash Respiratory: negative for cough or wheezing Urologic: negative for hematuria Abdominal: negative for nausea, vomiting, diarrhea, bright red blood per rectum, melena, or hematemesis Neurologic: negative for visual changes, syncope, or dizziness All other systems reviewed and are otherwise negative except as noted above.    Blood pressure 169/83,  pulse 81, height 4\' 11"  (1.499 m), weight 150 lb 8 oz (68.266 kg).  General appearance: alert and no distress Neck: no adenopathy, no JVD, supple, symmetrical, trachea midline, thyroid not enlarged, symmetric, no tenderness/mass/nodules and soft right carotid bruit Lungs: clear to auscultation bilaterally  EKG not performed today  ASSESSMENT AND PLAN:   PAD (peripheral artery disease) - bilateral CIA & CFA 50-69%; Claudication Status post PTA and stenting of the left common and external iliac artery segment and also expanding stent (10 mm x 6 cm). Followup Dopplers performed 05/14/13 were entirely normal. The patient is clinically improved. We'll continue to follow this on a semiannual basis and I will see her back when necessary.      Lorretta Harp MD FACP,FACC,FAHA, [facility] 06/23/2013 11:46 AM

## 2013-06-23 NOTE — Patient Instructions (Signed)
Follow up Dr Gwenlyn Found has needed and with Dr Ellyn Hack as routinely scheduled.

## 2013-07-17 ENCOUNTER — Other Ambulatory Visit (HOSPITAL_COMMUNITY): Payer: Self-pay | Admitting: *Deleted

## 2013-07-20 ENCOUNTER — Ambulatory Visit (HOSPITAL_COMMUNITY)
Admission: RE | Admit: 2013-07-20 | Discharge: 2013-07-20 | Disposition: A | Discharge status: Home / Self Care | Payer: Medicare Other | Source: Ambulatory Visit | Attending: Nephrology | Admitting: Nephrology

## 2013-07-20 DIAGNOSIS — Z5181 Encounter for therapeutic drug level monitoring: Secondary | ICD-10-CM | POA: Insufficient documentation

## 2013-07-20 DIAGNOSIS — D509 Iron deficiency anemia, unspecified: Secondary | ICD-10-CM | POA: Insufficient documentation

## 2013-07-20 MED ORDER — SODIUM CHLORIDE 0.9 % IV SOLN
510.0000 mg | INTRAVENOUS | Status: DC
  Administered 2013-07-20: 510 mg via INTRAVENOUS
  Filled 2013-07-20: qty 17

## 2013-07-20 NOTE — Discharge Instructions (Signed)

## 2013-07-21 ENCOUNTER — Other Ambulatory Visit: Payer: Self-pay | Admitting: Gastroenterology

## 2013-07-21 DIAGNOSIS — R109 Unspecified abdominal pain: Secondary | ICD-10-CM

## 2013-07-21 DIAGNOSIS — D649 Anemia, unspecified: Secondary | ICD-10-CM

## 2013-07-27 ENCOUNTER — Encounter (HOSPITAL_COMMUNITY)
Admission: RE | Admit: 2013-07-27 | Discharge: 2013-07-27 | Disposition: A | Discharge status: Home / Self Care | Payer: Medicare Other | Source: Ambulatory Visit | Attending: Nephrology | Admitting: Nephrology

## 2013-07-27 DIAGNOSIS — D631 Anemia in chronic kidney disease: Secondary | ICD-10-CM | POA: Diagnosis present

## 2013-07-27 DIAGNOSIS — N183 Chronic kidney disease, stage 3 unspecified: Secondary | ICD-10-CM | POA: Insufficient documentation

## 2013-07-27 DIAGNOSIS — N039 Chronic nephritic syndrome with unspecified morphologic changes: Principal | ICD-10-CM

## 2013-07-27 MED ORDER — SODIUM CHLORIDE 0.9 % IV SOLN
510.0000 mg | INTRAVENOUS | Status: DC
  Administered 2013-07-27: 510 mg via INTRAVENOUS
  Filled 2013-07-27: qty 17

## 2013-07-29 ENCOUNTER — Ambulatory Visit
Admission: RE | Admit: 2013-07-29 | Discharge: 2013-07-29 | Disposition: A | Discharge status: Home / Self Care | Payer: Medicare Other | Source: Ambulatory Visit | Attending: Gastroenterology | Admitting: Gastroenterology

## 2013-07-29 DIAGNOSIS — R109 Unspecified abdominal pain: Secondary | ICD-10-CM

## 2013-07-29 DIAGNOSIS — D649 Anemia, unspecified: Secondary | ICD-10-CM

## 2013-07-29 MED ORDER — IOHEXOL 300 MG/ML  SOLN
80.0000 mL | Freq: Once | INTRAMUSCULAR | Status: AC | PRN
  Administered 2013-07-29: 80 mL via INTRAVENOUS

## 2013-08-07 ENCOUNTER — Telehealth: Payer: Self-pay | Admitting: *Deleted

## 2013-08-07 NOTE — Telephone Encounter (Signed)
FAXED [facility] FOR ENDOSCOPY 08/14/13 CAN STOP ASA NOW. Plavix 5 days  Prior to procedure; if polyps removed may restart 2-3 days per Dr Ellyn Hack

## 2013-08-14 ENCOUNTER — Other Ambulatory Visit: Payer: Self-pay | Admitting: Gastroenterology

## 2013-09-11 ENCOUNTER — Encounter (HOSPITAL_COMMUNITY): Payer: Self-pay | Admitting: *Deleted

## 2013-09-11 ENCOUNTER — Emergency Department (HOSPITAL_COMMUNITY): Admission: EM | Admit: 2013-09-11 | Payer: Medicare Other | Source: Home / Self Care

## 2013-09-11 ENCOUNTER — Inpatient Hospital Stay (HOSPITAL_COMMUNITY)
Admission: AD | Admit: 2013-09-11 | Discharge: 2013-09-14 | DRG: 378 | Disposition: A | Discharge status: Home / Self Care | Payer: Medicare Other | Source: Ambulatory Visit | Attending: Internal Medicine | Admitting: Internal Medicine

## 2013-09-11 DIAGNOSIS — N182 Chronic kidney disease, stage 2 (mild): Secondary | ICD-10-CM

## 2013-09-11 DIAGNOSIS — N183 Chronic kidney disease, stage 3 unspecified: Secondary | ICD-10-CM | POA: Diagnosis present

## 2013-09-11 DIAGNOSIS — J45909 Unspecified asthma, uncomplicated: Secondary | ICD-10-CM | POA: Diagnosis present

## 2013-09-11 DIAGNOSIS — K648 Other hemorrhoids: Secondary | ICD-10-CM

## 2013-09-11 DIAGNOSIS — G8929 Other chronic pain: Secondary | ICD-10-CM

## 2013-09-11 DIAGNOSIS — Z803 Family history of malignant neoplasm of breast: Secondary | ICD-10-CM

## 2013-09-11 DIAGNOSIS — I129 Hypertensive chronic kidney disease with stage 1 through stage 4 chronic kidney disease, or unspecified chronic kidney disease: Secondary | ICD-10-CM | POA: Diagnosis present

## 2013-09-11 DIAGNOSIS — Z8249 Family history of ischemic heart disease and other diseases of the circulatory system: Secondary | ICD-10-CM

## 2013-09-11 DIAGNOSIS — I209 Angina pectoris, unspecified: Secondary | ICD-10-CM

## 2013-09-11 DIAGNOSIS — K922 Gastrointestinal hemorrhage, unspecified: Secondary | ICD-10-CM

## 2013-09-11 DIAGNOSIS — Z7982 Long term (current) use of aspirin: Secondary | ICD-10-CM

## 2013-09-11 DIAGNOSIS — F17200 Nicotine dependence, unspecified, uncomplicated: Secondary | ICD-10-CM | POA: Diagnosis present

## 2013-09-11 DIAGNOSIS — K31811 Angiodysplasia of stomach and duodenum with bleeding: Principal | ICD-10-CM | POA: Diagnosis present

## 2013-09-11 DIAGNOSIS — Z800 Family history of malignant neoplasm of digestive organs: Secondary | ICD-10-CM

## 2013-09-11 DIAGNOSIS — G4700 Insomnia, unspecified: Secondary | ICD-10-CM

## 2013-09-11 DIAGNOSIS — Z833 Family history of diabetes mellitus: Secondary | ICD-10-CM

## 2013-09-11 DIAGNOSIS — Z23 Encounter for immunization: Secondary | ICD-10-CM

## 2013-09-11 DIAGNOSIS — D509 Iron deficiency anemia, unspecified: Secondary | ICD-10-CM | POA: Diagnosis present

## 2013-09-11 DIAGNOSIS — D649 Anemia, unspecified: Secondary | ICD-10-CM | POA: Diagnosis present

## 2013-09-11 DIAGNOSIS — K2970 Gastritis, unspecified, without bleeding: Secondary | ICD-10-CM | POA: Diagnosis present

## 2013-09-11 DIAGNOSIS — Z9861 Coronary angioplasty status: Secondary | ICD-10-CM

## 2013-09-11 DIAGNOSIS — K219 Gastro-esophageal reflux disease without esophagitis: Secondary | ICD-10-CM | POA: Diagnosis present

## 2013-09-11 DIAGNOSIS — M549 Dorsalgia, unspecified: Secondary | ICD-10-CM

## 2013-09-11 DIAGNOSIS — N179 Acute kidney failure, unspecified: Secondary | ICD-10-CM | POA: Diagnosis present

## 2013-09-11 DIAGNOSIS — K5730 Diverticulosis of large intestine without perforation or abscess without bleeding: Secondary | ICD-10-CM

## 2013-09-11 DIAGNOSIS — I6529 Occlusion and stenosis of unspecified carotid artery: Secondary | ICD-10-CM | POA: Diagnosis present

## 2013-09-11 DIAGNOSIS — E785 Hyperlipidemia, unspecified: Secondary | ICD-10-CM | POA: Diagnosis present

## 2013-09-11 DIAGNOSIS — I252 Old myocardial infarction: Secondary | ICD-10-CM

## 2013-09-11 DIAGNOSIS — I2510 Atherosclerotic heart disease of native coronary artery without angina pectoris: Secondary | ICD-10-CM | POA: Diagnosis present

## 2013-09-11 DIAGNOSIS — D62 Acute posthemorrhagic anemia: Secondary | ICD-10-CM

## 2013-09-11 DIAGNOSIS — K2990 Gastroduodenitis, unspecified, without bleeding: Secondary | ICD-10-CM

## 2013-09-11 DIAGNOSIS — E669 Obesity, unspecified: Secondary | ICD-10-CM

## 2013-09-11 DIAGNOSIS — Z7902 Long term (current) use of antithrombotics/antiplatelets: Secondary | ICD-10-CM

## 2013-09-11 DIAGNOSIS — B3781 Candidal esophagitis: Secondary | ICD-10-CM | POA: Diagnosis present

## 2013-09-11 DIAGNOSIS — I10 Essential (primary) hypertension: Secondary | ICD-10-CM

## 2013-09-11 DIAGNOSIS — I739 Peripheral vascular disease, unspecified: Secondary | ICD-10-CM | POA: Diagnosis present

## 2013-09-11 LAB — CBC
HCT: 23.3 % — ABNORMAL LOW (ref 36.0–46.0)
Hemoglobin: 7.2 g/dL — ABNORMAL LOW (ref 12.0–15.0)
MCH: 27.3 pg (ref 26.0–34.0)
MCHC: 30.9 g/dL (ref 30.0–36.0)
MCV: 88.3 fL (ref 78.0–100.0)
Platelets: 284 10*3/uL (ref 150–400)
RBC: 2.64 MIL/uL — ABNORMAL LOW (ref 3.87–5.11)
RDW: 21.7 % — AB (ref 11.5–15.5)
WBC: 11.2 10*3/uL — ABNORMAL HIGH (ref 4.0–10.5)

## 2013-09-11 LAB — COMPREHENSIVE METABOLIC PANEL
ALBUMIN: 3.8 g/dL (ref 3.5–5.2)
ALK PHOS: 69 U/L (ref 39–117)
ALT: 9 U/L (ref 0–35)
AST: 15 U/L (ref 0–37)
BUN: 33 mg/dL — AB (ref 6–23)
CO2: 21 mEq/L (ref 19–32)
Calcium: 9.2 mg/dL (ref 8.4–10.5)
Chloride: 102 mEq/L (ref 96–112)
Creatinine, Ser: 1.59 mg/dL — ABNORMAL HIGH (ref 0.50–1.10)
GFR calc Af Amer: 38 mL/min — ABNORMAL LOW (ref >90)
GFR calc non Af Amer: 33 mL/min — ABNORMAL LOW (ref >90)
Glucose, Bld: 116 mg/dL — ABNORMAL HIGH (ref 70–99)
POTASSIUM: 4.2 meq/L (ref 3.7–5.3)
SODIUM: 140 meq/L (ref 137–147)
Total Bilirubin: 0.2 mg/dL — ABNORMAL LOW (ref 0.3–1.2)
Total Protein: 7.9 g/dL (ref 6.0–8.3)

## 2013-09-11 LAB — ABO/RH: ABO/RH(D): A POS

## 2013-09-11 LAB — PREPARE RBC (CROSSMATCH)

## 2013-09-11 MED ORDER — MOMETASONE FURO-FORMOTEROL FUM 100-5 MCG/ACT IN AERO
2.0000 | INHALATION_SPRAY | Freq: Two times a day (BID) | RESPIRATORY_TRACT | Status: DC
  Administered 2013-09-11 – 2013-09-13 (×5): 2 via RESPIRATORY_TRACT
  Filled 2013-09-11: qty 8.8

## 2013-09-11 MED ORDER — AMLODIPINE BESYLATE 5 MG PO TABS
5.0000 mg | ORAL_TABLET | Freq: Every day | ORAL | Status: DC
  Administered 2013-09-11 – 2013-09-14 (×4): 5 mg via ORAL
  Filled 2013-09-11 (×4): qty 1

## 2013-09-11 MED ORDER — ONDANSETRON HCL 4 MG PO TABS: 4.0000 mg | ORAL_TABLET | Freq: Four times a day (QID) | ORAL | Status: DC | PRN

## 2013-09-11 MED ORDER — FERROUS SULFATE 325 (65 FE) MG PO TBEC: 325.0000 mg | DELAYED_RELEASE_TABLET | Freq: Every day | ORAL | Status: DC

## 2013-09-11 MED ORDER — MORPHINE SULFATE 2 MG/ML IJ SOLN: 2.0000 mg | INTRAMUSCULAR | Status: DC | PRN

## 2013-09-11 MED ORDER — SODIUM CHLORIDE 0.9 % IJ SOLN
3.0000 mL | Freq: Two times a day (BID) | INTRAMUSCULAR | Status: DC
  Administered 2013-09-11 – 2013-09-13 (×5): 3 mL via INTRAVENOUS

## 2013-09-11 MED ORDER — METOPROLOL SUCCINATE ER 50 MG PO TB24
150.0000 mg | ORAL_TABLET | Freq: Every day | ORAL | Status: DC
  Administered 2013-09-11 – 2013-09-14 (×4): 150 mg via ORAL
  Filled 2013-09-11 (×5): qty 1

## 2013-09-11 MED ORDER — DIPHENHYDRAMINE HCL 50 MG/ML IJ SOLN
12.5000 mg | Freq: Once | INTRAMUSCULAR | Status: AC
  Administered 2013-09-11: 12.5 mg via INTRAVENOUS
  Filled 2013-09-11: qty 0.25

## 2013-09-11 MED ORDER — ACETAMINOPHEN 325 MG PO TABS: 650.0000 mg | ORAL_TABLET | Freq: Four times a day (QID) | ORAL | Status: DC | PRN

## 2013-09-11 MED ORDER — FERROUS SULFATE 325 (65 FE) MG PO TABS
325.0000 mg | ORAL_TABLET | Freq: Every day | ORAL | Status: DC
  Administered 2013-09-12 – 2013-09-14 (×3): 325 mg via ORAL
  Filled 2013-09-11 (×4): qty 1

## 2013-09-11 MED ORDER — PNEUMOCOCCAL VAC POLYVALENT 25 MCG/0.5ML IJ INJ
0.5000 mL | INJECTION | INTRAMUSCULAR | Status: AC
  Administered 2013-09-12: 0.5 mL via INTRAMUSCULAR
  Filled 2013-09-11: qty 0.5

## 2013-09-11 MED ORDER — ACETAMINOPHEN 650 MG RE SUPP: 650.0000 mg | Freq: Four times a day (QID) | RECTAL | Status: DC | PRN

## 2013-09-11 MED ORDER — CLONIDINE HCL 0.1 MG PO TABS
0.1000 mg | ORAL_TABLET | Freq: Every day | ORAL | Status: DC
  Administered 2013-09-11 – 2013-09-14 (×4): 0.1 mg via ORAL
  Filled 2013-09-11 (×4): qty 1

## 2013-09-11 MED ORDER — ATORVASTATIN CALCIUM 40 MG PO TABS
40.0000 mg | ORAL_TABLET | Freq: Every day | ORAL | Status: DC
  Administered 2013-09-11 – 2013-09-13 (×3): 40 mg via ORAL
  Filled 2013-09-11 (×4): qty 1

## 2013-09-11 MED ORDER — SODIUM CHLORIDE 0.9 % IV SOLN
INTRAVENOUS | Status: DC
  Administered 2013-09-12: 01:00:00 via INTRAVENOUS

## 2013-09-11 MED ORDER — ONDANSETRON HCL 4 MG/2ML IJ SOLN: 4.0000 mg | Freq: Four times a day (QID) | INTRAMUSCULAR | Status: DC | PRN

## 2013-09-11 MED ORDER — ISOSORBIDE MONONITRATE ER 30 MG PO TB24
30.0000 mg | ORAL_TABLET | Freq: Every day | ORAL | Status: DC
  Administered 2013-09-11 – 2013-09-14 (×4): 30 mg via ORAL
  Filled 2013-09-11 (×4): qty 1

## 2013-09-11 NOTE — Progress Notes (Signed)
NURSING PROGRESS NOTE  ANNALYN BINZ BY:[id number] Admission Data: 09/11/2013 5:24 PM Attending Provider: Donne Hazel, MD [id number], ERIC, MD Code Status: Full  Santasia D Tao is a 68 y.o. female patient admitted from ED:  -No acute distress noted.  -No complaints of shortness of breath.  -No complaints of chest pain.   Blood pressure 123/74, pulse 95, temperature 99 F (37.2 C), temperature source Oral, resp. rate 18, height 4\' 11"  (1.499 m), weight 68.584 kg (151 lb 3.2 oz), SpO2 100.00%.   Allergies:  Review of patient's allergies indicates no known allergies.  Past Medical History:   has a past medical history of Hypertension; Dyslipidemia, goal LDL below 70; Internal hemorrhoids ( 04/11/2010); Diverticulosis (  04/11/2010 ); Insomnia; Asthma ( 10/31/2007); Chronic back pain; Iron deficiency anemia; GERD (gastroesophageal reflux disease); Peripheral arterial disease; CAD (coronary artery disease) (08/05/2007); CKD (chronic kidney disease), stage II; Tobacco abuse; and Carotid artery disease.  Past Surgical History:   has past surgical history that includes Colonoscopy (04/11/2010); Iliac vein angioplasty / stenting (Left, 05/04/2013); Tonsillectomy (1960's); Cholecystectomy (1975); and Coronary angioplasty with stent (08/05/2007).  Social History:   reports that she has been smoking Cigarettes.  She has a 12 pack-year smoking history. She has never used smokeless tobacco. She reports that she does not drink alcohol or use illicit drugs.  Skin: Intact  Patient/Family orientated to room. Information packet given to patient/family. Admission inpatient armband information verified with patient/family to include name and date of birth and placed on patient arm. Side rails up x 2, fall assessment and education completed with patient/family. Patient/family able to verbalize understanding of risk associated with falls and verbalized understanding to call for assistance before getting out of  bed. Call light within reach. Patient/family able to voice and demonstrate understanding of unit orientation instructions.    Will continue to evaluate and treat per MD orders.

## 2013-09-11 NOTE — Progress Notes (Signed)
Pt to receive one unit of PRBC tonight - pt state she received blood 50 years ago and had hives - on call physician paged to see if they want to pre-medicate prior to blood administration.

## 2013-09-11 NOTE — H&P (Addendum)
[facility] History and Physical  [name] [id number] DOB: 1945-10-23 DOA: 09/11/2013  Referring physician: [facility] PCP: Kevan Ny, MD  Specialists: Dr. Watt Climes (GI)  Chief Complaint: Anemia  HPI: Kayla Mcclure is a 68 y.o. female  With a hx notable for CAD s/p stenting and on chronic asa/plavix, HTN, iron deficiency aniemia, CKD who presents as a direct admission from clinic after being found to be anemic with hgb of 7.5 ( was over 10 four months ago). Pt denies BRBPR. Pt does have black appearing stools, but has been on chronic iron replacement. Pt denies abd pain but does report mild nausea, sweats, and dizziness. Pt has been directly admitted for further work up.  Of note, pt reports having outpt EGD done by Dr. Watt Climes for work up of iron deficiency anemia. Pt reports that "he found something" but could not clarify further.  Review of Systems:  Per above, the remainder of the 10pt ros reviewed and are neg  Past Medical History  Diagnosis Date  . Hypertension     a. Normal renal arteries by PV angio 05/2013.  Marland Kitchen Dyslipidemia, goal LDL below 70   . Internal hemorrhoids  04/11/2010    By colonoscopy  . Diverticulosis   04/11/2010     Colonoscopy  . Insomnia   . Asthma  10/31/2007  . Chronic back pain   . Iron deficiency anemia   . GERD (gastroesophageal reflux disease)   . Peripheral arterial disease     a. s/p PTA and stenting of L CIA stenosis 05/2013.  Marland Kitchen CAD (coronary artery disease) 08/05/2007    a. NSTEMI 2009 s/p PCI to the RCA Promus DES 2.5 mm x 23 mm (3.0 mm). b. Myoview 05/2012: inferolateral ST changes that were more pronounced w/exercise, but study was essentially normal w/no evidence of ischemia, EF = 81%.   . CKD (chronic kidney disease), stage II   . Tobacco abuse   . Carotid artery disease     a. 50-69% RICA by outpt dopp 04/22/13, f/u due 04/2014.   Past Surgical History  Procedure Laterality Date  . Colonoscopy  04/11/2010  . Iliac  vein angioplasty / stenting Left 05/04/2013    common/notes 05/04/2013  . Tonsillectomy  1960's  . Cholecystectomy  1975  . Coronary angioplasty with stent placement  08/05/2007    Promus DES 2.5 mm x 23 mm (3 mm)  to RCA; EF 60-70%   Social History:  reports that she has been smoking Cigarettes.  She has a 12 pack-year smoking history. She has never used smokeless tobacco. She reports that she does not drink alcohol or use illicit drugs.  where does patient live--home, ALF, SNF? and with whom if at home?  Can patient participate in ADLs?  No Known Allergies  Family History  Problem Relation Age of Onset  . Diabetes Mother   . Heart disease Mother   . Diabetes Sister   . Heart attack Mother 51  . Heart attack Father 87  . Breast cancer Sister 17  . Breast cancer Mother 63  . Colon cancer Mother 54    (be sure to complete)  Prior to Admission medications   Medication Sig Start Date End Date Taking? Authorizing Provider  albuterol (PROVENTIL HFA;VENTOLIN HFA) 108 (90 BASE) MCG/ACT inhaler Inhale 2 puffs into the lungs every 4 (four) hours as needed. For shortness of pain    Historical Provider, MD  amLODipine (NORVASC) 5 MG tablet Take 5 mg by mouth daily.  Historical Provider, MD  aspirin EC 81 MG tablet Take 81 mg by mouth daily.    Historical Provider, MD  atorvastatin (LIPITOR) 40 MG tablet Take 40 mg by mouth daily.    Historical Provider, MD  calcium-vitamin D (OYSTER CALCIUM 500 + D) 500-200 MG-UNIT per tablet Take 1 tablet by mouth daily with breakfast.     Historical Provider, MD  cloNIDine (CATAPRES) 0.1 MG tablet Take 1 tablet by mouth daily. 05/22/13   Historical Provider, MD  clopidogrel (PLAVIX) 75 MG tablet Take 75 mg by mouth daily.    Historical Provider, MD  famotidine (PEPCID) 40 MG tablet Take 40 mg by mouth 2 (two) times daily.    Historical Provider, MD  ferrous sulfate 325 (65 FE) MG EC tablet Take 325 mg by mouth daily with breakfast.    Historical Provider, MD   Fluticasone-Salmeterol (ADVAIR) 100-50 MCG/DOSE AEPB Inhale 1 puff into the lungs 2 (two) times daily as needed (shortness of breath).    Historical Provider, MD  hydrOXYzine (ATARAX/VISTARIL) 50 MG tablet Take 50 mg by mouth at bedtime as needed.    Historical Provider, MD  isosorbide mononitrate (IMDUR) 30 MG 24 hr tablet Take 1 tablet (30 mg total) by mouth daily. 12/22/12   Leonie Man, MD  loratadine (CLARITIN) 10 MG tablet Take 10 mg by mouth daily as needed for allergies.    Historical Provider, MD  metoprolol succinate (TOPROL-XL) 100 MG 24 hr tablet Take 150 mg by mouth daily. Take with or immediately following a meal.    Historical Provider, MD  nitroGLYCERIN (NITROSTAT) 0.4 MG SL tablet Place 1 tablet (0.4 mg total) under the tongue every 5 (five) minutes as needed for chest pain. 12/22/12   Leonie Man, MD  ranitidine (ZANTAC) 150 MG tablet Take 2 tablets by mouth 2 (two) times daily. 05/22/13   Historical Provider, MD   Physical Exam: Filed Vitals:   09/11/13 1444  BP: 123/74  Pulse: 95  Temp: 99 F (37.2 C)  TempSrc: Oral  Resp: 18  Height: 4\' 11"  (1.499 m)  Weight: 151 lb 3.2 oz (68.584 kg)  SpO2: 100%     General:  Awake, in nad  Eyes: PERRL B  ENT: membranes moist, dentition fair  Neck: trachea midline, neck supple  Cardiovascular: regular, s1, s2  Respiratory: normal resp effort, no wheezing  Abdomen: soft, obese  Skin: normal skin turgor, no abnormal skin lesions seen  Musculoskeletal: perfused, no clubbing  Psychiatric: mood/affect normal// no auditory/visual hallucinations  Neurologic: cn2-12 grossly intact, strength/sensation intact  Labs on Admission:  Basic Metabolic Panel: No results found for this basename: NA, K, CL, CO2, GLUCOSE, BUN, CREATININE, CALCIUM, MG, PHOS,  in the last 168 hours Liver Function Tests: No results found for this basename: AST, ALT, ALKPHOS, BILITOT, PROT, ALBUMIN,  in the last 168 hours No results found for  this basename: LIPASE, AMYLASE,  in the last 168 hours No results found for this basename: AMMONIA,  in the last 168 hours CBC: No results found for this basename: WBC, NEUTROABS, HGB, HCT, MCV, PLT,  in the last 168 hours Cardiac Enzymes: No results found for this basename: CKTOTAL, CKMB, CKMBINDEX, TROPONINI,  in the last 168 hours  BNP (last 3 results) No results found for this basename: PROBNP,  in the last 8760 hours CBG: No results found for this basename: GLUCAP,  in the last 168 hours  Radiological Exams on Admission: No results found.   Assessment/Plan Active Problems:  Iron deficiency anemia   Essential hypertension, benign   CAD S/P percutaneous coronary angioplasty -- Promus DES 2.5 mm x 20 mm postdilated to 3 mm   Asthma   CKD, stage 2   Anemia   1. Anemia 1. Reportedly hgb 7.5 today in the office 2. Will repeat CBC 3. If hgb remains low, will transfuse 1 unit prbc's especially given hx of heart disease 4. Will heme check stools and if pos, would consider GI consult (Dr. Watt Climes is primary GI) 5. Admit to med/tele 6. Hold asa and plavix for now 7. Will obtain outpt records from EGD that was done one month ago 2. CAD 1. Stable and asymptomatic 2. Holding antiplatelets as per above 3. CKD 1. Will follow renal panel 2. Primary nephrologist is Dr. Justin Mend 4. HTN 1. BP stable 2. Cont home meds 5. DVT prophylaxis 1. scd's  Code Status: Full (must indicate code status--if unknown or must be presumed, indicate so) Family Communication: Pt and family in room (indicate person spoken with, if applicable, with phone number if by telephone) Disposition Plan: pending (indicate anticipated LOS)  Time spent: 76min  Shemeka Wardle, [facility] Pager [phone]  If 7PM-7AM, please contact night-coverage [email or web address] Password [facility] 09/11/2013, 3:49 PM

## 2013-09-12 LAB — RETICULOCYTES
RBC.: 2.95 MIL/uL — ABNORMAL LOW (ref 3.87–5.11)
Retic Count, Absolute: 180 10*3/uL (ref 19.0–186.0)
Retic Ct Pct: 6.1 % — ABNORMAL HIGH (ref 0.4–3.1)

## 2013-09-12 LAB — COMPREHENSIVE METABOLIC PANEL
ALT: 7 U/L (ref 0–35)
AST: 13 U/L (ref 0–37)
Albumin: 3 g/dL — ABNORMAL LOW (ref 3.5–5.2)
Alkaline Phosphatase: 55 U/L (ref 39–117)
BUN: 29 mg/dL — ABNORMAL HIGH (ref 6–23)
CALCIUM: 8.6 mg/dL (ref 8.4–10.5)
CO2: 21 mEq/L (ref 19–32)
Chloride: 105 mEq/L (ref 96–112)
Creatinine, Ser: 1.52 mg/dL — ABNORMAL HIGH (ref 0.50–1.10)
GFR calc non Af Amer: 34 mL/min — ABNORMAL LOW (ref >90)
GFR, EST AFRICAN AMERICAN: 40 mL/min — AB (ref >90)
GLUCOSE: 143 mg/dL — AB (ref 70–99)
Potassium: 4.2 mEq/L (ref 3.7–5.3)
Sodium: 139 mEq/L (ref 137–147)
TOTAL PROTEIN: 6.2 g/dL (ref 6.0–8.3)
Total Bilirubin: 0.4 mg/dL (ref 0.3–1.2)

## 2013-09-12 LAB — CBC
HEMATOCRIT: 22.7 % — AB (ref 36.0–46.0)
Hemoglobin: 7.2 g/dL — ABNORMAL LOW (ref 12.0–15.0)
MCH: 27.8 pg (ref 26.0–34.0)
MCHC: 31.7 g/dL (ref 30.0–36.0)
MCV: 87.6 fL (ref 78.0–100.0)
Platelets: 223 10*3/uL (ref 150–400)
RBC: 2.59 MIL/uL — ABNORMAL LOW (ref 3.87–5.11)
RDW: 19.2 % — AB (ref 11.5–15.5)
WBC: 7.6 10*3/uL (ref 4.0–10.5)

## 2013-09-12 LAB — PREPARE RBC (CROSSMATCH)

## 2013-09-12 LAB — IRON AND TIBC
IRON: 182 ug/dL — AB (ref 42–135)
Saturation Ratios: 59 % — ABNORMAL HIGH (ref 20–55)
TIBC: 308 ug/dL (ref 250–470)
UIBC: 126 ug/dL (ref 125–400)

## 2013-09-12 LAB — HEMOGLOBIN AND HEMATOCRIT, BLOOD
HCT: 30.2 % — ABNORMAL LOW (ref 36.0–46.0)
Hemoglobin: 9.6 g/dL — ABNORMAL LOW (ref 12.0–15.0)

## 2013-09-12 LAB — FERRITIN: Ferritin: 236 ng/mL (ref 10–291)

## 2013-09-12 LAB — LACTATE DEHYDROGENASE: LDH: 229 U/L (ref 94–250)

## 2013-09-12 LAB — VITAMIN B12: Vitamin B-12: 348 pg/mL (ref 211–911)

## 2013-09-12 LAB — FOLATE: Folate: 7.3 ng/mL

## 2013-09-12 MED ORDER — PANTOPRAZOLE SODIUM 40 MG PO TBEC
40.0000 mg | DELAYED_RELEASE_TABLET | Freq: Two times a day (BID) | ORAL | Status: DC
Start: 2013-09-12 — End: 2013-09-14
  Administered 2013-09-12 – 2013-09-14 (×5): 40 mg via ORAL
  Filled 2013-09-12 (×5): qty 1

## 2013-09-12 MED ORDER — FUROSEMIDE 10 MG/ML IJ SOLN
INTRAMUSCULAR | Status: AC
  Administered 2013-09-12: 10 mg via INTRAVENOUS
  Filled 2013-09-12: qty 4

## 2013-09-12 MED ORDER — ADULT MULTIVITAMIN W/MINERALS CH
1.0000 | ORAL_TABLET | Freq: Every day | ORAL | Status: DC
  Administered 2013-09-12 – 2013-09-14 (×3): 1 via ORAL
  Filled 2013-09-12 (×3): qty 1

## 2013-09-12 MED ORDER — FUROSEMIDE 10 MG/ML IJ SOLN
10.0000 mg | Freq: Once | INTRAMUSCULAR | Status: AC
  Administered 2013-09-12: 10 mg via INTRAVENOUS

## 2013-09-12 NOTE — Progress Notes (Addendum)
On call physician notified of pt's hgb 7.2 post transfusion. Hgb 7.2 prior to 1 unit PRBC. On call physician state will let the team know in the AM.

## 2013-09-12 NOTE — Progress Notes (Signed)
Maintenance paged per pt request - toilet seat is not fastened securely.

## 2013-09-12 NOTE — Progress Notes (Signed)
INITIAL NUTRITION ASSESSMENT  DOCUMENTATION CODES Per approved criteria  -Obesity Unspecified   INTERVENTION: Encourage adequate healthful PO intake Provide Multivitamin with minerals daily RD to continue to monitor and provide further intervention as needed  NUTRITION DIAGNOSIS: Inadequate oral intake related to poor appetite as evidenced by pt's report of eating very little for the past month.   Goal: Pt to meet >/= 90% of their estimated nutrition needs   Monitor:  PO intake, weight trend, labs  Reason for Assessment: Malnutrition Screening Tool  68 y.o. female  Admitting Dx: <principal problem not specified>  ASSESSMENT: 68 y.o. female with a hx notable for CAD s/p stenting and on chronic asa/plavix, HTN, iron deficiency aniemia, CKD who presents as a direct admission from clinic after being found to be anemic with hgb of 7.5 ( was over 10 four months ago).  Pt reports that she has had a poor appetite for the past month. She has only been eating a few spoonfuls at each meal and sometimes has gone 3 days without eating. She reports eating better since admission although reports she only ate a few bites at lunch. Per nursing notes pt ate 60% of breakfast and 50% of lunch. Pt reports that her son is bringing her some food/snacks from home. Pt denies weight loss stating she has been weighing 147 lbs. Per weight history below pt's weight has been stable at 150 lbs for the past 9 months. Discussed healthful weight loss and encouraged pt to continue eating 3 balanced meal daily to ensure she is getting adequate nutrition.  Height: Ht Readings from Last 1 Encounters:  09/11/13 4\' 11"  (1.499 m)    Weight: Wt Readings from Last 1 Encounters:  09/11/13 151 lb 3.2 oz (68.584 kg)    Ideal Body Weight: 98 lbs  % Ideal Body Weight: 154%  Wt Readings from Last 10 Encounters:  09/11/13 151 lb 3.2 oz (68.584 kg)  07/27/13 150 lb (68.04 kg)  07/20/13 150 lb (68.04 kg)  06/23/13 150  lb 8 oz (68.266 kg)  05/05/13 151 lb 0.2 oz (68.5 kg)  05/05/13 151 lb 0.2 oz (68.5 kg)  04/17/13 150 lb 1.6 oz (68.085 kg)  03/03/13 150 lb 4.8 oz (68.176 kg)  12/22/12 150 lb 11.2 oz (68.357 kg)  06/13/12 162 lb (73.483 kg)    Usual Body Weight: 147 lbs  % Usual Body Weight: 103%  BMI:  Body mass index is 30.52 kg/(m^2). (obese)  Estimated Nutritional Needs: Kcal: 1350-1600 Protein: 70-80 grams Fluid: 1.4-1.6 L/day  Skin: intact  Diet Order: Cardiac  EDUCATION NEEDS: -No education needs identified at this time   Intake/Output Summary (Last 24 hours) at 09/12/13 1443 Last data filed at 09/12/13 1418  Gross per 24 hour  Intake 1722.5 ml  Output      0 ml  Net 1722.5 ml    Last BM: 6/11  Labs:   Recent Labs Lab 09/11/13 1600 09/12/13 0445  NA 140 139  K 4.2 4.2  CL 102 105  CO2 21 21  BUN 33* 29*  CREATININE 1.59* 1.52*  CALCIUM 9.2 8.6  GLUCOSE 116* 143*    CBG (last 3)  No results found for this basename: GLUCAP,  in the last 72 hours  Scheduled Meds: . amLODipine  5 mg Oral Daily  . atorvastatin  40 mg Oral q1800  . cloNIDine  0.1 mg Oral Daily  . ferrous sulfate  325 mg Oral Q breakfast  . isosorbide mononitrate  30 mg Oral  Daily  . metoprolol succinate  150 mg Oral Q breakfast  . mometasone-formoterol  2 puff Inhalation BID  . pantoprazole  40 mg Oral BID  . sodium chloride  3 mL Intravenous Q12H    Continuous Infusions:   Past Medical History  Diagnosis Date  . Hypertension     a. Normal renal arteries by PV angio 05/2013.  Marland Kitchen Dyslipidemia, goal LDL below 70   . Internal hemorrhoids  04/11/2010    By colonoscopy  . Diverticulosis   04/11/2010     Colonoscopy  . Insomnia   . Asthma  10/31/2007  . Chronic back pain   . Iron deficiency anemia   . GERD (gastroesophageal reflux disease)   . Peripheral arterial disease     a. s/p PTA and stenting of L CIA stenosis 05/2013.  Marland Kitchen CAD (coronary artery disease) 08/05/2007    a. NSTEMI  2009 s/p PCI to the RCA Promus DES 2.5 mm x 23 mm (3.0 mm). b. Myoview 05/2012: inferolateral ST changes that were more pronounced w/exercise, but study was essentially normal w/no evidence of ischemia, EF = 81%.   . CKD (chronic kidney disease), stage II   . Tobacco abuse   . Carotid artery disease     a. 50-69% RICA by outpt dopp 04/22/13, f/u due 04/2014.    Past Surgical History  Procedure Laterality Date  . Colonoscopy  04/11/2010  . Iliac vein angioplasty / stenting Left 05/04/2013    common/notes 05/04/2013  . Tonsillectomy  1960's  . Cholecystectomy  1975  . Coronary angioplasty with stent placement  08/05/2007    Promus DES 2.5 mm x 23 mm (3 mm)  to RCA; EF 60-70%    Pryor Ochoa RD, LDN Inpatient Clinical Dietitian Pager: [phone] After Hours Pager: [phone]

## 2013-09-12 NOTE — Progress Notes (Addendum)
Patient Demographics  Kayla Mcclure, is a 68 y.o. female, DOB - 08/11/1945, [id number]  Admit date - 09/11/2013   Admitting Physician Charlynne Cousins, MD  Outpatient Primary MD for the patient is DEAN, ERIC, MD  LOS - 1   No chief complaint on file.          Subjective:   Kayla Mcclure today has, No headache, No chest pain, mild epigastric discomfort but no pain - No Nausea, No new weakness tingling or numbness, No Cough - SOB.      Assessment & Plan    1. Anemia found on blood work in the clinic. Recent EGD showed gastritis by Dr. Daisey Must done 4 weeks ago, will place her on PPI, check stat anemia panel, transfuse 1 unit of packed RBC, monitor H&H, check stool for occult blood. Discussed with GI physician Dr. Michail Sermon who advises outpatient workup if no acute bleed. Will monitor H&H.     2. Acute renal failure on chronic kidney disease stage III baseline creatinine is 1.2. Due to anemia, transfuse, avoid nephrotoxins and monitor BMP in the morning.     3. CAD. Chest pain-free and stable, antiplatelets held upon admission agree. Monitor. Continue beta blocker, Imdur, statin for secondary prevention. If no acute bleeding is found and H&H remains stable we will resume antiplatelet medications with PPI. She has a recent peripheral stent discussed with Dr. Irish Lack (notolin stent feb 2015), okay to hold antiplatelet agents for now.      4. Hypertension. Continue beta blocker, Imdur, clonidine and Norvasc. Monitor blood pressure and adjust as needed.     5. History of gastritis continue PPI.      Code Status: Full  Family Communication: None present  Disposition Plan: Home   Procedures recent EGD confirmed with Dr. Michail Sermon shows gastritis   Consults  discussed  with either GI physician Dr. Michail Sermon over the phone outpatient workup   Medications  Scheduled Meds: . amLODipine  5 mg Oral Daily  . atorvastatin  40 mg Oral q1800  . cloNIDine  0.1 mg Oral Daily  . ferrous sulfate  325 mg Oral Q breakfast  . furosemide  10 mg Intravenous Once  . isosorbide mononitrate  30 mg Oral Daily  . metoprolol succinate  150 mg Oral Q breakfast  . mometasone-formoterol  2 puff Inhalation BID  . pantoprazole  40 mg Oral BID  . pneumococcal 23 valent vaccine  0.5 mL Intramuscular Tomorrow-1000  . sodium chloride  3 mL Intravenous Q12H   Continuous Infusions:  PRN Meds:.acetaminophen, acetaminophen, morphine injection, ondansetron (ZOFRAN) IV, ondansetron  DVT Prophylaxis    SCDs    Lab Results  Component Value Date   PLT 223 09/12/2013    Antibiotics    Anti-infectives   None          Objective:   Filed Vitals:   09/12/13 0005 09/12/13 0059 09/12/13 0418 09/12/13 0920  BP: 93/52 94/57 97/60  105/64  Pulse: 79 81 63 71  Temp: 98.4 F (36.9 C) 98.4 F (36.9 C) 98.8 F (37.1 C) 99 F (37.2 C)  TempSrc: Oral Oral Oral Oral  Resp: 14 14 14 18   Height:      Weight:      SpO2: 97%  97% 98% 100%    Wt Readings from Last 3 Encounters:  09/11/13 68.584 kg (151 lb 3.2 oz)  07/27/13 68.04 kg (150 lb)  07/20/13 68.04 kg (150 lb)     Intake/Output Summary (Last 24 hours) at 09/12/13 0935 Last data filed at 09/12/13 0600  Gross per 24 hour  Intake    720 ml  Output      0 ml  Net    720 ml     Physical Exam  Awake Alert, Oriented X 3, No new F.N deficits, Normal affect [place].AT,PERRAL Supple Neck,No JVD, No cervical lymphadenopathy appriciated.  Symmetrical Chest wall movement, Good air movement bilaterally, CTAB RRR,No Gallops,Rubs or new Murmurs, No Parasternal Heave +ve B.Sounds, Abd Soft, No tenderness, No organomegaly appriciated, No rebound - guarding or rigidity. No Cyanosis, Clubbing or edema, No new Rash or bruise       Data Review   Micro Results No results found for this or any previous visit (from the past 240 hour(s)).  Radiology Reports No results found.  CBC  Recent Labs Lab 09/11/13 1600 09/12/13 0445  WBC 11.2* 7.6  HGB 7.2* 7.2*  HCT 23.3* 22.7*  PLT 284 223  MCV 88.3 87.6  MCH 27.3 27.8  MCHC 30.9 31.7  RDW 21.7* 19.2*    Chemistries   Recent Labs Lab 09/11/13 1600 09/12/13 0445  NA 140 139  K 4.2 4.2  CL 102 105  CO2 21 21  GLUCOSE 116* 143*  BUN 33* 29*  CREATININE 1.59* 1.52*  CALCIUM 9.2 8.6  AST 15 13  ALT 9 7  ALKPHOS 69 55  BILITOT 0.2* 0.4   ------------------------------------------------------------------------------------------------------------------ estimated creatinine clearance is 30.3 ml/min (by C-G formula based on Cr of 1.52). ------------------------------------------------------------------------------------------------------------------ No results found for this basename: HGBA1C,  in the last 72 hours ------------------------------------------------------------------------------------------------------------------ No results found for this basename: CHOL, HDL, LDLCALC, TRIG, CHOLHDL, LDLDIRECT,  in the last 72 hours ------------------------------------------------------------------------------------------------------------------ No results found for this basename: TSH, T4TOTAL, FREET3, T3FREE, THYROIDAB,  in the last 72 hours ------------------------------------------------------------------------------------------------------------------ No results found for this basename: VITAMINB12, FOLATE, FERRITIN, TIBC, IRON, RETICCTPCT,  in the last 72 hours  Coagulation profile No results found for this basename: INR, PROTIME,  in the last 168 hours  No results found for this basename: DDIMER,  in the last 72 hours  Cardiac Enzymes No results found for this basename: CK, CKMB, TROPONINI, MYOGLOBIN,  in the last 168  hours ------------------------------------------------------------------------------------------------------------------ No components found with this basename: POCBNP,      Time Spent in minutes   35   Lala Lund K M.D on 09/12/2013 at 9:35 AM  Between 7am to 7pm - Pager - [phone]  After 7pm go to [email or web address] - password [email or web address]  And look for the night coverage person covering for me after hours  [facility] Office  [phone]   **Disclaimer: This note may have been dictated with voice recognition software. Similar sounding words can inadvertently be transcribed and this note may contain transcription errors which may not have been corrected upon publication of note.**

## 2013-09-13 ENCOUNTER — Encounter (HOSPITAL_COMMUNITY)
Admission: AD | Disposition: A | Discharge status: Home / Self Care | Payer: Self-pay | Source: Ambulatory Visit | Attending: Internal Medicine

## 2013-09-13 ENCOUNTER — Encounter (HOSPITAL_COMMUNITY): Payer: Self-pay

## 2013-09-13 DIAGNOSIS — I739 Peripheral vascular disease, unspecified: Secondary | ICD-10-CM

## 2013-09-13 DIAGNOSIS — K922 Gastrointestinal hemorrhage, unspecified: Secondary | ICD-10-CM

## 2013-09-13 DIAGNOSIS — D62 Acute posthemorrhagic anemia: Secondary | ICD-10-CM

## 2013-09-13 HISTORY — PX: ESOPHAGOGASTRODUODENOSCOPY: SHX5428

## 2013-09-13 LAB — CBC
HCT: 32.2 % — ABNORMAL LOW (ref 36.0–46.0)
HEMOGLOBIN: 10.3 g/dL — AB (ref 12.0–15.0)
MCH: 27.8 pg (ref 26.0–34.0)
MCHC: 32 g/dL (ref 30.0–36.0)
MCV: 86.8 fL (ref 78.0–100.0)
Platelets: 241 10*3/uL (ref 150–400)
RBC: 3.71 MIL/uL — AB (ref 3.87–5.11)
RDW: 18.5 % — ABNORMAL HIGH (ref 11.5–15.5)
WBC: 7.9 10*3/uL (ref 4.0–10.5)

## 2013-09-13 LAB — TYPE AND SCREEN
ABO/RH(D): A POS
Antibody Screen: NEGATIVE
Unit division: 0
Unit division: 0

## 2013-09-13 LAB — OCCULT BLOOD X 1 CARD TO LAB, STOOL: Fecal Occult Bld: POSITIVE — AB

## 2013-09-13 SURGERY — EGD (ESOPHAGOGASTRODUODENOSCOPY)
Anesthesia: Moderate Sedation

## 2013-09-13 MED ORDER — MIDAZOLAM HCL 5 MG/ML IJ SOLN
INTRAMUSCULAR | Status: AC
Start: 2013-09-13 — End: 2013-09-13
  Filled 2013-09-13: qty 2

## 2013-09-13 MED ORDER — FENTANYL CITRATE 0.05 MG/ML IJ SOLN
INTRAMUSCULAR | Status: AC
  Filled 2013-09-13: qty 2

## 2013-09-13 MED ORDER — MIDAZOLAM HCL 10 MG/2ML IJ SOLN
INTRAMUSCULAR | Status: DC | PRN
  Administered 2013-09-13: 2 mg via INTRAVENOUS

## 2013-09-13 MED ORDER — SODIUM CHLORIDE 0.9 % IV SOLN
INTRAVENOUS | Status: DC
  Administered 2013-09-13: 13:00:00 via INTRAVENOUS

## 2013-09-13 MED ORDER — FENTANYL CITRATE 0.05 MG/ML IJ SOLN
INTRAMUSCULAR | Status: DC | PRN
  Administered 2013-09-13: 25 ug via INTRAVENOUS

## 2013-09-13 MED ORDER — BUTAMBEN-TETRACAINE-BENZOCAINE 2-2-14 % EX AERO
INHALATION_SPRAY | CUTANEOUS | Status: DC | PRN
  Administered 2013-09-13: 2 via TOPICAL

## 2013-09-13 NOTE — H&P (View-Only) (Signed)
Referring Provider: Dr. Sloan Leiter Primary Care Physician:  Dr. Chapman Fitch Primary Gastroenterologist:  Dr. Watt Climes  Reason for Consultation:  Anemia  HPI: Kayla Mcclure is a 68 y.o. female with a history of iron deficiency anemia who last saw Dr. Watt Climes in the office in April and an EGD on 08/14/13 showed showed acute gastritis with biopsies consistent with chronic inflammation and ulcerated gastric mucosa without H. Pylori, dysplasia, or malignancy. She was sent to the ER by her PCP, Dr. Chapman Fitch due to a Hgb of 7.5 (10.2 on 07/16/13). She has been feeling weak, dizzy for the past 2 weeks. Chronically has black stools on iron supplementation. Denies abdominal pain/N/V/heartburn. Denies NSAIDs. Last colonoscopy in 2012 that showed internal and external hemorrhoids and left-sided diverticulosis. Hgb 7.2 on admit has increased to 9.6 after 2 U PRBCs. Denies hematochezia, hematemesis. Prior to admit felt very weak and lightheaded and reports having to hold onto the wall because of her weakness the day she came to the hospital. Was on ASA/Plavix prior to admit.    Past Medical History  Diagnosis Date  . Hypertension     a. Normal renal arteries by PV angio 05/2013.  Marland Kitchen Dyslipidemia, goal LDL below 70   . Internal hemorrhoids  04/11/2010    By colonoscopy  . Diverticulosis   04/11/2010     Colonoscopy  . Insomnia   . Asthma  10/31/2007  . Chronic back pain   . Iron deficiency anemia   . GERD (gastroesophageal reflux disease)   . Peripheral arterial disease     a. s/p PTA and stenting of L CIA stenosis 05/2013.  Marland Kitchen CAD (coronary artery disease) 08/05/2007    a. NSTEMI 2009 s/p PCI to the RCA Promus DES 2.5 mm x 23 mm (3.0 mm). b. Myoview 05/2012: inferolateral ST changes that were more pronounced w/exercise, but study was essentially normal w/no evidence of ischemia, EF = 81%.   . CKD (chronic kidney disease), stage II   . Tobacco abuse   . Carotid artery disease     a. 50-69% RICA by outpt dopp 04/22/13, f/u  due 04/2014.    Past Surgical History  Procedure Laterality Date  . Colonoscopy  04/11/2010  . Iliac vein angioplasty / stenting Left 05/04/2013    common/notes 05/04/2013  . Tonsillectomy  1960's  . Cholecystectomy  1975  . Coronary angioplasty with stent placement  08/05/2007    Promus DES 2.5 mm x 23 mm (3 mm)  to RCA; EF 60-70%    Prior to Admission medications   Medication Sig Start Date End Date Taking? Authorizing Provider  albuterol (PROVENTIL HFA;VENTOLIN HFA) 108 (90 BASE) MCG/ACT inhaler Inhale 2 puffs into the lungs every 4 (four) hours as needed. For shortness of breath   Yes Historical Provider, MD  amLODipine (NORVASC) 5 MG tablet Take 5 mg by mouth daily.   Yes Historical Provider, MD  aspirin EC 81 MG tablet Take 81 mg by mouth daily.   Yes Historical Provider, MD  atorvastatin (LIPITOR) 40 MG tablet Take 40 mg by mouth daily.   Yes Historical Provider, MD  calcium-vitamin D (OYSTER CALCIUM 500 + D) 500-200 MG-UNIT per tablet Take 1 tablet by mouth daily with breakfast.    Yes Historical Provider, MD  cloNIDine (CATAPRES) 0.1 MG tablet Take 1 tablet by mouth daily. 05/22/13  Yes Historical Provider, MD  clopidogrel (PLAVIX) 75 MG tablet Take 75 mg by mouth daily.   Yes Historical Provider, MD  famotidine (PEPCID) 40 MG  tablet Take 40 mg by mouth 2 (two) times daily.   Yes Historical Provider, MD  ferrous sulfate 325 (65 FE) MG EC tablet Take 325 mg by mouth daily with breakfast.   Yes Historical Provider, MD  Fluticasone-Salmeterol (ADVAIR) 100-50 MCG/DOSE AEPB Inhale 1 puff into the lungs 2 (two) times daily as needed (shortness of breath).   Yes Historical Provider, MD  hydrOXYzine (ATARAX/VISTARIL) 50 MG tablet Take 50 mg by mouth at bedtime as needed.   Yes Historical Provider, MD  isosorbide mononitrate (IMDUR) 30 MG 24 hr tablet Take 1 tablet (30 mg total) by mouth daily. 12/22/12  Yes Leonie Man, MD  loratadine (CLARITIN) 10 MG tablet Take 10 mg by mouth daily as  needed for allergies.   Yes Historical Provider, MD  metoprolol succinate (TOPROL-XL) 100 MG 24 hr tablet Take 150 mg by mouth daily. Take with or immediately following a meal.   Yes Historical Provider, MD  nitroGLYCERIN (NITROSTAT) 0.4 MG SL tablet Place 1 tablet (0.4 mg total) under the tongue every 5 (five) minutes as needed for chest pain. 12/22/12  Yes Leonie Man, MD  ranitidine (ZANTAC) 150 MG tablet Take 2 tablets by mouth 2 (two) times daily as needed for heartburn.  05/22/13  Yes Historical Provider, MD    Scheduled Meds: . amLODipine  5 mg Oral Daily  . atorvastatin  40 mg Oral q1800  . cloNIDine  0.1 mg Oral Daily  . ferrous sulfate  325 mg Oral Q breakfast  . isosorbide mononitrate  30 mg Oral Daily  . metoprolol succinate  150 mg Oral Q breakfast  . mometasone-formoterol  2 puff Inhalation BID  . multivitamin with minerals  1 tablet Oral Daily  . pantoprazole  40 mg Oral BID  . sodium chloride  3 mL Intravenous Q12H   Continuous Infusions:  PRN Meds:.acetaminophen, acetaminophen, morphine injection, ondansetron (ZOFRAN) IV, ondansetron  Allergies as of 09/11/2013  . (No Known Allergies)    Family History  Problem Relation Age of Onset  . Diabetes Mother   . Heart disease Mother   . Diabetes Sister   . Heart attack Mother 14  . Heart attack Father 73  . Breast cancer Sister 57  . Breast cancer Mother 60  . Colon cancer Mother 36    History   Social History  . Marital Status: Married    Spouse Name: N/A    Number of Children: 1  . Years of Education: N/A   Occupational History  .     Social History Main Topics  . Smoking status: Former Smoker    Types: Cigarettes    Quit date: 05/04/2013  . Smokeless tobacco: Never Used  . Alcohol Use: Yes     Comment: occasionally 3 times a year  . Drug Use: No  . Sexual Activity: No   Other Topics Concern  . Not on file   Social History Narrative   Married. Has one child. Grandmother for a  great-grandmother and 1.   No real exercise.   Currently smokes 7 or less alcohol today (sometimes she says they more chest burn out of the ashtray).   Denies alcohol consumption.    Review of Systems: All negative except as stated above in HPI.  Physical Exam: Vital signs: Filed Vitals:   09/13/13 1110  BP: 128/65  Pulse: 69  Temp: 98.1  Resp: 16   Last BM Date: 09/13/13 General:   Well-developed, well-nourished, pleasant and cooperative in NAD HEENT:  anicteric, no oral lesions Neck: supple, nontender Lungs:  Clear throughout to auscultation.   No wheezes, crackles, or rhonchi. No acute distress. Heart:  Regular rate and rhythm; no murmurs, clicks, rubs,  or gallops. Abdomen: periumbilical tenderness with minimal guarding, soft, nondistended, +BS  Rectal:  Deferred Ext: no edema Neuro: alert and oriented  GI:  Lab Results:  Recent Labs  09/11/13 1600 09/12/13 0445 09/12/13 1440 09/13/13 0814  WBC 11.2* 7.6  --  7.9  HGB 7.2* 7.2* 9.6* 10.3*  HCT 23.3* 22.7* 30.2* 32.2*  PLT 284 223  --  241   BMET  Recent Labs  09/11/13 1600 09/12/13 0445  NA 140 139  K 4.2 4.2  CL 102 105  CO2 21 21  GLUCOSE 116* 143*  BUN 33* 29*  CREATININE 1.59* 1.52*  CALCIUM 9.2 8.6   LFT  Recent Labs  09/12/13 0445  PROT 6.2  ALBUMIN 3.0*  AST 13  ALT 7  ALKPHOS 55  BILITOT 0.4   PT/INR No results found for this basename: LABPROT, INR,  in the last 72 hours   Studies/Results: No results found.  Impression/Plan: 68 yo with symptomatic anemia and heme positive stools. EGD last month showing gastritis. Her black stools are likely due to her iron pills but due to her 2 weeks of weakness and dizziness in the setting of worsening anemia will do a repeat EGD. If EGD unrevealing and Hgb remains stable, then will recommend outpt f/u with Dr. Watt Climes as previously scheduled (appt already on 09/18/13) and he can decide on the timing of a repeat colonoscopy at that time. NPO. EGD  today.    LOS: 2 days   [place] C.  09/13/2013, 12:07 PM

## 2013-09-13 NOTE — Interval H&P Note (Signed)
History and Physical Interval Note:  09/13/2013 1:26 PM  Kayla Mcclure  has presented today for surgery, with the diagnosis of anemia, possible GI bleed  The various methods of treatment have been discussed with the patient and family. After consideration of risks, benefits and other options for treatment, the patient has consented to  Procedure(s): ESOPHAGOGASTRODUODENOSCOPY (EGD) (N/A) as a surgical intervention .  The patient's history has been reviewed, patient examined, no change in status, stable for surgery.  I have reviewed the patient's chart and labs.  Questions were answered to the patient's satisfaction.     [place] C.

## 2013-09-13 NOTE — Consult Note (Signed)
Referring Provider: Dr. Sloan Leiter Primary Care Physician:  Dr. Chapman Fitch Primary Gastroenterologist:  Dr. Watt Climes  Reason for Consultation:  Anemia  HPI: Kayla Mcclure is a 68 y.o. female with a history of iron deficiency anemia who last saw Dr. Watt Climes in the office in April and an EGD on 08/14/13 showed showed acute gastritis with biopsies consistent with chronic inflammation and ulcerated gastric mucosa without H. Pylori, dysplasia, or malignancy. She was sent to the ER by her PCP, Dr. Chapman Fitch due to a Hgb of 7.5 (10.2 on 07/16/13). She has been feeling weak, dizzy for the past 2 weeks. Chronically has black stools on iron supplementation. Denies abdominal pain/N/V/heartburn. Denies NSAIDs. Last colonoscopy in 2012 that showed internal and external hemorrhoids and left-sided diverticulosis. Hgb 7.2 on admit has increased to 9.6 after 2 U PRBCs. Denies hematochezia, hematemesis. Prior to admit felt very weak and lightheaded and reports having to hold onto the wall because of her weakness the day she came to the hospital. Was on ASA/Plavix prior to admit.    Past Medical History  Diagnosis Date  . Hypertension     a. Normal renal arteries by PV angio 05/2013.  Marland Kitchen Dyslipidemia, goal LDL below 70   . Internal hemorrhoids  04/11/2010    By colonoscopy  . Diverticulosis   04/11/2010     Colonoscopy  . Insomnia   . Asthma  10/31/2007  . Chronic back pain   . Iron deficiency anemia   . GERD (gastroesophageal reflux disease)   . Peripheral arterial disease     a. s/p PTA and stenting of L CIA stenosis 05/2013.  Marland Kitchen CAD (coronary artery disease) 08/05/2007    a. NSTEMI 2009 s/p PCI to the RCA Promus DES 2.5 mm x 23 mm (3.0 mm). b. Myoview 05/2012: inferolateral ST changes that were more pronounced w/exercise, but study was essentially normal w/no evidence of ischemia, EF = 81%.   . CKD (chronic kidney disease), stage II   . Tobacco abuse   . Carotid artery disease     a. 50-69% RICA by outpt dopp 04/22/13, f/u  due 04/2014.    Past Surgical History  Procedure Laterality Date  . Colonoscopy  04/11/2010  . Iliac vein angioplasty / stenting Left 05/04/2013    common/notes 05/04/2013  . Tonsillectomy  1960's  . Cholecystectomy  1975  . Coronary angioplasty with stent placement  08/05/2007    Promus DES 2.5 mm x 23 mm (3 mm)  to RCA; EF 60-70%    Prior to Admission medications   Medication Sig Start Date End Date Taking? Authorizing Provider  albuterol (PROVENTIL HFA;VENTOLIN HFA) 108 (90 BASE) MCG/ACT inhaler Inhale 2 puffs into the lungs every 4 (four) hours as needed. For shortness of breath   Yes Historical Provider, MD  amLODipine (NORVASC) 5 MG tablet Take 5 mg by mouth daily.   Yes Historical Provider, MD  aspirin EC 81 MG tablet Take 81 mg by mouth daily.   Yes Historical Provider, MD  atorvastatin (LIPITOR) 40 MG tablet Take 40 mg by mouth daily.   Yes Historical Provider, MD  calcium-vitamin D (OYSTER CALCIUM 500 + D) 500-200 MG-UNIT per tablet Take 1 tablet by mouth daily with breakfast.    Yes Historical Provider, MD  cloNIDine (CATAPRES) 0.1 MG tablet Take 1 tablet by mouth daily. 05/22/13  Yes Historical Provider, MD  clopidogrel (PLAVIX) 75 MG tablet Take 75 mg by mouth daily.   Yes Historical Provider, MD  famotidine (PEPCID) 40 MG  tablet Take 40 mg by mouth 2 (two) times daily.   Yes Historical Provider, MD  ferrous sulfate 325 (65 FE) MG EC tablet Take 325 mg by mouth daily with breakfast.   Yes Historical Provider, MD  Fluticasone-Salmeterol (ADVAIR) 100-50 MCG/DOSE AEPB Inhale 1 puff into the lungs 2 (two) times daily as needed (shortness of breath).   Yes Historical Provider, MD  hydrOXYzine (ATARAX/VISTARIL) 50 MG tablet Take 50 mg by mouth at bedtime as needed.   Yes Historical Provider, MD  isosorbide mononitrate (IMDUR) 30 MG 24 hr tablet Take 1 tablet (30 mg total) by mouth daily. 12/22/12  Yes Leonie Man, MD  loratadine (CLARITIN) 10 MG tablet Take 10 mg by mouth daily as  needed for allergies.   Yes Historical Provider, MD  metoprolol succinate (TOPROL-XL) 100 MG 24 hr tablet Take 150 mg by mouth daily. Take with or immediately following a meal.   Yes Historical Provider, MD  nitroGLYCERIN (NITROSTAT) 0.4 MG SL tablet Place 1 tablet (0.4 mg total) under the tongue every 5 (five) minutes as needed for chest pain. 12/22/12  Yes Leonie Man, MD  ranitidine (ZANTAC) 150 MG tablet Take 2 tablets by mouth 2 (two) times daily as needed for heartburn.  05/22/13  Yes Historical Provider, MD    Scheduled Meds: . amLODipine  5 mg Oral Daily  . atorvastatin  40 mg Oral q1800  . cloNIDine  0.1 mg Oral Daily  . ferrous sulfate  325 mg Oral Q breakfast  . isosorbide mononitrate  30 mg Oral Daily  . metoprolol succinate  150 mg Oral Q breakfast  . mometasone-formoterol  2 puff Inhalation BID  . multivitamin with minerals  1 tablet Oral Daily  . pantoprazole  40 mg Oral BID  . sodium chloride  3 mL Intravenous Q12H   Continuous Infusions:  PRN Meds:.acetaminophen, acetaminophen, morphine injection, ondansetron (ZOFRAN) IV, ondansetron  Allergies as of 09/11/2013  . (No Known Allergies)    Family History  Problem Relation Age of Onset  . Diabetes Mother   . Heart disease Mother   . Diabetes Sister   . Heart attack Mother 67  . Heart attack Father 41  . Breast cancer Sister 37  . Breast cancer Mother 66  . Colon cancer Mother 52    History   Social History  . Marital Status: Married    Spouse Name: N/A    Number of Children: 1  . Years of Education: N/A   Occupational History  .     Social History Main Topics  . Smoking status: Former Smoker    Types: Cigarettes    Quit date: 05/04/2013  . Smokeless tobacco: Never Used  . Alcohol Use: Yes     Comment: occasionally 3 times a year  . Drug Use: No  . Sexual Activity: No   Other Topics Concern  . Not on file   Social History Narrative   Married. Has one child. Grandmother for a  great-grandmother and 1.   No real exercise.   Currently smokes 7 or less alcohol today (sometimes she says they more chest burn out of the ashtray).   Denies alcohol consumption.    Review of Systems: All negative except as stated above in HPI.  Physical Exam: Vital signs: Filed Vitals:   09/13/13 1110  BP: 128/65  Pulse: 69  Temp: 98.1  Resp: 16   Last BM Date: 09/13/13 General:   Well-developed, well-nourished, pleasant and cooperative in NAD HEENT:  anicteric, no oral lesions Neck: supple, nontender Lungs:  Clear throughout to auscultation.   No wheezes, crackles, or rhonchi. No acute distress. Heart:  Regular rate and rhythm; no murmurs, clicks, rubs,  or gallops. Abdomen: periumbilical tenderness with minimal guarding, soft, nondistended, +BS  Rectal:  Deferred Ext: no edema Neuro: alert and oriented  GI:  Lab Results:  Recent Labs  09/11/13 1600 09/12/13 0445 09/12/13 1440 09/13/13 0814  WBC 11.2* 7.6  --  7.9  HGB 7.2* 7.2* 9.6* 10.3*  HCT 23.3* 22.7* 30.2* 32.2*  PLT 284 223  --  241   BMET  Recent Labs  09/11/13 1600 09/12/13 0445  NA 140 139  K 4.2 4.2  CL 102 105  CO2 21 21  GLUCOSE 116* 143*  BUN 33* 29*  CREATININE 1.59* 1.52*  CALCIUM 9.2 8.6   LFT  Recent Labs  09/12/13 0445  PROT 6.2  ALBUMIN 3.0*  AST 13  ALT 7  ALKPHOS 55  BILITOT 0.4   PT/INR No results found for this basename: LABPROT, INR,  in the last 72 hours   Studies/Results: No results found.  Impression/Plan: 68 yo with symptomatic anemia and heme positive stools. EGD last month showing gastritis. Her black stools are likely due to her iron pills but due to her 2 weeks of weakness and dizziness in the setting of worsening anemia will do a repeat EGD. If EGD unrevealing and Hgb remains stable, then will recommend outpt f/u with Dr. Watt Climes as previously scheduled (appt already on 09/18/13) and he can decide on the timing of a repeat colonoscopy at that time. NPO. EGD  today.    LOS: 2 days   [place] C.  09/13/2013, 12:07 PM

## 2013-09-13 NOTE — Brief Op Note (Signed)
Small nonbleeding gastric AVM noted - fulgurated with argon plasma coagulation. No active bleeding seen. Minimal Candida esophagitis. See endopro note for details. Clears. Watch H/H overnight and if stable consider d/c tomorrow with f/u with Kayla Mcclure.

## 2013-09-13 NOTE — Progress Notes (Signed)
PATIENT DETAILS Name: Kayla Mcclure Age: 68 y.o. Sex: female Date of Birth: 10/03/1945 Admit Date: 09/11/2013 Admitting Physician Charlynne Cousins, MD [id number], ERIC, MD  Subjective: No major issues this am-had "black" stools this am.   Assessment/Plan: Active Problems: Anemia -suspect recent blood loss-subacute blood loss -admitted and transfused 1 unit of PRBC, Hb stable post transfusion -since passing black stools (on Fe supplementation)-that is guiac positive- I have asked Dr Michail Sermon to evaluate for need for endoscopic procedures prior to discharge. Apparently had a outpatient EGD done 4 weeks back  Suspected UGI bleed -see above -c/w PPI -await GI eval  Mild Acute on Chronic Renal Failure stage 3 -likely secondary to above -monitor lytes periodically  CAD - Chest pain-free and stable, antiplatelets held upon admission agree. Monitor. Continue beta blocker, Imdur, statin for secondary prevention. If no acute bleeding is found and H&H remains stable we will resume antiplatelet medications with PPI. She has a recent peripheral stent, Dr Candiss Norse discussed with Dr. Irish Lack (notolin stent feb 2015), okay to hold antiplatelet agents for now.   Hypertension -Continue beta blocker, Imdur, clonidine and Norvasc. Monitor blood pressure and adjust as needed.    History of gastritis  -continue PPI.  Disposition: Remain inpatient  DVT Prophylaxis:  SCD's  Code Status: Full code   Family Communication None  Procedures:  None  CONSULTS:  GI  Time spent 40 minutes-which includes 50% of the time with face-to-face with patient/ family and coordinating care related to the above assessment and plan.    MEDICATIONS: Scheduled Meds: . amLODipine  5 mg Oral Daily  . atorvastatin  40 mg Oral q1800  . cloNIDine  0.1 mg Oral Daily  . ferrous sulfate  325 mg Oral Q breakfast  . isosorbide mononitrate  30 mg Oral Daily  . metoprolol succinate  150 mg Oral Q  breakfast  . mometasone-formoterol  2 puff Inhalation BID  . multivitamin with minerals  1 tablet Oral Daily  . pantoprazole  40 mg Oral BID  . sodium chloride  3 mL Intravenous Q12H   Continuous Infusions:  PRN Meds:.acetaminophen, acetaminophen, morphine injection, ondansetron (ZOFRAN) IV, ondansetron  Antibiotics: Anti-infectives   None       PHYSICAL EXAM: Vital signs in last 24 hours: Filed Vitals:   09/13/13 0558 09/13/13 0830 09/13/13 1033 09/13/13 1110  BP: 119/69 134/71  128/65  Pulse: 66 69    Temp: 98.1 F (36.7 C)     TempSrc: Oral     Resp: 16     Height:      Weight:      SpO2: 98%  100%     Weight change:  Filed Weights   09/11/13 1444  Weight: 68.584 kg (151 lb 3.2 oz)   Body mass index is 30.52 kg/(m^2).   Gen Exam: Awake and alert with clear speech.   Neck: Supple, No JVD.   Chest: B/L Clear.   CVS: S1 S2 Regular, no murmurs.  Abdomen: soft, BS +, non tender, non distended.  Extremities: no edema, lower extremities warm to touch. Neurologic: Non Focal.   Skin: No Rash.   Wounds: N/A.   Intake/Output from previous day:  Intake/Output Summary (Last 24 hours) at 09/13/13 1124 Last data filed at 09/13/13 0500  Gross per 24 hour  Intake 1256.5 ml  Output      0 ml  Net 1256.5 ml     LAB RESULTS: CBC  Recent Labs Lab  09/11/13 1600 09/12/13 0445 09/12/13 1440 09/13/13 0814  WBC 11.2* 7.6  --  7.9  HGB 7.2* 7.2* 9.6* 10.3*  HCT 23.3* 22.7* 30.2* 32.2*  PLT 284 223  --  241  MCV 88.3 87.6  --  86.8  MCH 27.3 27.8  --  27.8  MCHC 30.9 31.7  --  32.0  RDW 21.7* 19.2*  --  18.5*    Chemistries   Recent Labs Lab 09/11/13 1600 09/12/13 0445  NA 140 139  K 4.2 4.2  CL 102 105  CO2 21 21  GLUCOSE 116* 143*  BUN 33* 29*  CREATININE 1.59* 1.52*  CALCIUM 9.2 8.6    CBG: No results found for this basename: GLUCAP,  in the last 168 hours  GFR Estimated Creatinine Clearance: 30.3 ml/min (by C-G formula based on Cr of  1.52).  Coagulation profile No results found for this basename: INR, PROTIME,  in the last 168 hours  Cardiac Enzymes No results found for this basename: CK, CKMB, TROPONINI, MYOGLOBIN,  in the last 168 hours  No components found with this basename: POCBNP,  No results found for this basename: DDIMER,  in the last 72 hours No results found for this basename: HGBA1C,  in the last 72 hours No results found for this basename: CHOL, HDL, LDLCALC, TRIG, CHOLHDL, LDLDIRECT,  in the last 72 hours No results found for this basename: TSH, T4TOTAL, FREET3, T3FREE, THYROIDAB,  in the last 72 hours  Recent Labs  09/12/13 1015  VITAMINB12 348  FOLATE 7.3  FERRITIN 236  TIBC 308  IRON 182*  RETICCTPCT 6.1*   No results found for this basename: LIPASE, AMYLASE,  in the last 72 hours  Urine Studies No results found for this basename: UACOL, UAPR, USPG, UPH, UTP, UGL, UKET, UBIL, UHGB, UNIT, UROB, ULEU, UEPI, UWBC, URBC, UBAC, CAST, CRYS, UCOM, BILUA,  in the last 72 hours  MICROBIOLOGY: No results found for this or any previous visit (from the past 240 hour(s)).  RADIOLOGY STUDIES/RESULTS: No results found.  Oren Binet, MD  [facility] Pager:[phone]  If 7PM-7AM, please contact night-coverage [email or web address] Password [email or web address] 09/13/2013, 11:24 AM   LOS: 2 days   **Disclaimer: This note may have been dictated with voice recognition software. Similar sounding words can inadvertently be transcribed and this note may contain transcription errors which may not have been corrected upon publication of note.**

## 2013-09-13 NOTE — Progress Notes (Signed)
Please note-rectal exam-black stool in rectal vault, guiac positive. Per patient, she had frequent black stools a few days prior to admission.Spoke with Dr Michail Sermon, he will evaluate, keep NPO.

## 2013-09-13 NOTE — Op Note (Signed)
[facility] [place], [address]   ENDOSCOPY PROCEDURE REPORT  PATIENT: Kayla Mcclure, Kayla Mcclure  MR#: [id number] BIRTHDATE: [birth year] , 68  yrs. old GENDER: Female  ENDOSCOPIST: Wilford Corner, MD REFERRED [id number] team  PROCEDURE DATE:  09/13/2013 PROCEDURE:   EGD w/ ablation ASA CLASS:   Class III INDICATIONS:Anemia.   Heme positive stool. MEDICATIONS: Fentanyl 37.5 mcg IV, Versed 3 mg IV, and Cetacaine spray x 2  TOPICAL ANESTHETIC:  DESCRIPTION OF PROCEDURE:   After the risks benefits and alternatives of the procedure were thoroughly explained, informed consent was obtained.  The Pentax Gastroscope [id number]  endoscope was introduced through the mouth and advanced to the second portion of the duodenum , limited by Without limitations.   The instrument was slowly withdrawn as the mucosa was fully examined.     FINDINGS: The endoscope was inserted into the oropharynx and esophagus was intubated.  The gastroesophageal junction was noted to be 38 cm from the incisors.   Endoscope was advanced into the stomach, which revealed clear fluid. Mild edema noted in the antrum. A small nonbleeding AVM was seen in the antrum.  The endoscope was advanced to the duodenal bulb and second portion of duodenum which were unremarkable.  The endoscope was withdrawn back into the stomach and retroflexion revealed a normal proximal stomach. Argon plasma coagulation successfully used to fulgurate the AVM. Minimal white plaques scattered in the mid and proximal esophagus consistent with Candida esophagitis.  COMPLICATIONS: None  ENDOSCOPIC IMPRESSION:     Gastric AVM - s/p APC (see above) Minimal Candida esophagitis (treat as outpt with Nystatin)  RECOMMENDATIONS: Clears and advance; Watch H/H and if stable f/u as outpt   REPEAT EXAM: N/A  _______________________________ Wilford Corner, MD eSigned:  Wilford Corner, MD 09/13/2013 4:45  PM    CC:  PATIENT NAME:  Shizu, Sterr MR#: [id number]

## 2013-09-14 ENCOUNTER — Encounter (HOSPITAL_COMMUNITY): Payer: Self-pay | Admitting: Gastroenterology

## 2013-09-14 DIAGNOSIS — E785 Hyperlipidemia, unspecified: Secondary | ICD-10-CM

## 2013-09-14 LAB — CBC
HCT: 30.7 % — ABNORMAL LOW (ref 36.0–46.0)
Hemoglobin: 9.8 g/dL — ABNORMAL LOW (ref 12.0–15.0)
MCH: 28.1 pg (ref 26.0–34.0)
MCHC: 31.9 g/dL (ref 30.0–36.0)
MCV: 88 fL (ref 78.0–100.0)
PLATELETS: 228 10*3/uL (ref 150–400)
RBC: 3.49 MIL/uL — ABNORMAL LOW (ref 3.87–5.11)
RDW: 18.5 % — ABNORMAL HIGH (ref 11.5–15.5)
WBC: 7.1 10*3/uL (ref 4.0–10.5)

## 2013-09-14 LAB — HAPTOGLOBIN: Haptoglobin: 226 mg/dL — ABNORMAL HIGH (ref 45–215)

## 2013-09-14 MED ORDER — FLUCONAZOLE 100 MG PO TABS: 100.0000 mg | ORAL_TABLET | Freq: Every day | ORAL | Status: DC

## 2013-09-14 MED ORDER — PANTOPRAZOLE SODIUM 40 MG PO TBEC: 40.0000 mg | DELAYED_RELEASE_TABLET | Freq: Every day | ORAL | Status: DC

## 2013-09-14 NOTE — Discharge Summary (Addendum)
PATIENT DETAILS Name: Kayla Mcclure Age: 68 y.o. Sex: female Date of Birth: 1945/10/24 MRN: [id number]. Admit Date: 09/11/2013 Admitting Physician: Charlynne Cousins, MD [id number], ERIC, MD  Recommendations for Outpatient Follow-up:  1. Keep appt with Dr Watt Climes on 6/19-for further evaluation and consideration of Colonoscopy 2. Please recheck CBC in 2 weeks.  PRIMARY DISCHARGE DIAGNOSIS:  Active Problems:   Iron deficiency anemia   Essential hypertension, benign   CAD S/P percutaneous coronary angioplasty -- Promus DES 2.5 mm x 20 mm postdilated to 3 mm   Asthma   CKD, stage 2   Anemia      PAST MEDICAL HISTORY: Past Medical History  Diagnosis Date  . Hypertension     a. Normal renal arteries by PV angio 05/2013.  Marland Kitchen Dyslipidemia, goal LDL below 70   . Internal hemorrhoids  04/11/2010    By colonoscopy  . Diverticulosis   04/11/2010     Colonoscopy  . Insomnia   . Asthma  10/31/2007  . Chronic back pain   . Iron deficiency anemia   . GERD (gastroesophageal reflux disease)   . Peripheral arterial disease     a. s/p PTA and stenting of L CIA stenosis 05/2013.  Marland Kitchen CAD (coronary artery disease) 08/05/2007    a. NSTEMI 2009 s/p PCI to the RCA Promus DES 2.5 mm x 23 mm (3.0 mm). b. Myoview 05/2012: inferolateral ST changes that were more pronounced w/exercise, but study was essentially normal w/no evidence of ischemia, EF = 81%.   . CKD (chronic kidney disease), stage II   . Tobacco abuse   . Carotid artery disease     a. 50-69% RICA by outpt dopp 04/22/13, f/u due 04/2014.    DISCHARGE MEDICATIONS:   Medication List    STOP taking these medications       aspirin EC 81 MG tablet     clopidogrel 75 MG tablet  Commonly known as:  PLAVIX     famotidine 40 MG tablet  Commonly known as:  PEPCID      TAKE these medications       albuterol 108 (90 BASE) MCG/ACT inhaler  Commonly known as:  PROVENTIL HFA;VENTOLIN HFA  Inhale 2 puffs into the lungs every 4 (four) hours  as needed. For shortness of breath     amLODipine 5 MG tablet  Commonly known as:  NORVASC  Take 5 mg by mouth daily.     atorvastatin 40 MG tablet  Commonly known as:  LIPITOR  Take 40 mg by mouth daily.     cloNIDine 0.1 MG tablet  Commonly known as:  CATAPRES  Take 1 tablet by mouth daily.     ferrous sulfate 325 (65 FE) MG EC tablet  Take 325 mg by mouth daily with breakfast.     fluconazole 100 MG tablet  Commonly known as:  DIFLUCAN  Take 1 tablet (100 mg total) by mouth daily.     Fluticasone-Salmeterol 100-50 MCG/DOSE Aepb  Commonly known as:  ADVAIR  Inhale 1 puff into the lungs 2 (two) times daily as needed (shortness of breath).     hydrOXYzine 50 MG tablet  Commonly known as:  ATARAX/VISTARIL  Take 50 mg by mouth at bedtime as needed.     isosorbide mononitrate 30 MG 24 hr tablet  Commonly known as:  IMDUR  Take 1 tablet (30 mg total) by mouth daily.     loratadine 10 MG tablet  Commonly known as:  CLARITIN  Take  10 mg by mouth daily as needed for allergies.     metoprolol succinate 100 MG 24 hr tablet  Commonly known as:  TOPROL-XL  Take 150 mg by mouth daily. Take with or immediately following a meal.     nitroGLYCERIN 0.4 MG SL tablet  Commonly known as:  NITROSTAT  Place 1 tablet (0.4 mg total) under the tongue every 5 (five) minutes as needed for chest pain.     OYSTER CALCIUM 500 + D 500-200 MG-UNIT per tablet  Generic drug:  calcium-vitamin D  Take 1 tablet by mouth daily with breakfast.     pantoprazole 40 MG tablet  Commonly known as:  PROTONIX  Take 1 tablet (40 mg total) by mouth daily.     ranitidine 150 MG tablet  Commonly known as:  ZANTAC  Take 2 tablets by mouth 2 (two) times daily as needed for heartburn.        ALLERGIES:  No Known Allergies  BRIEF HPI:  See H&P, Labs, Consult and Test reports for all details in brief, patient was admitted for evaluation of anemia  CONSULTATIONS:   GI  PERTINENT RADIOLOGIC STUDIES: No  results found.   PERTINENT LAB RESULTS: CBC:  Recent Labs  09/13/13 0814 09/14/13 0558  WBC 7.9 7.1  HGB 10.3* 9.8*  HCT 32.2* 30.7*  PLT 241 228   CMET CMP     Component Value Date/Time   NA 139 09/12/2013 0445   K 4.2 09/12/2013 0445   CL 105 09/12/2013 0445   CO2 21 09/12/2013 0445   GLUCOSE 143* 09/12/2013 0445   BUN 29* 09/12/2013 0445   CREATININE 1.52* 09/12/2013 0445   CREATININE 1.31* 05/07/2013 0947   CALCIUM 8.6 09/12/2013 0445   PROT 6.2 09/12/2013 0445   ALBUMIN 3.0* 09/12/2013 0445   AST 13 09/12/2013 0445   ALT 7 09/12/2013 0445   ALKPHOS 55 09/12/2013 0445   BILITOT 0.4 09/12/2013 0445   GFRNONAA 34* 09/12/2013 0445   GFRAA 40* 09/12/2013 0445    GFR Estimated Creatinine Clearance: 30.3 ml/min (by C-G formula based on Cr of 1.52). No results found for this basename: LIPASE, AMYLASE,  in the last 72 hours No results found for this basename: CKTOTAL, CKMB, CKMBINDEX, TROPONINI,  in the last 72 hours No components found with this basename: POCBNP,  No results found for this basename: DDIMER,  in the last 72 hours No results found for this basename: HGBA1C,  in the last 72 hours No results found for this basename: CHOL, HDL, LDLCALC, TRIG, CHOLHDL, LDLDIRECT,  in the last 72 hours No results found for this basename: TSH, T4TOTAL, FREET3, T3FREE, THYROIDAB,  in the last 72 hours  Recent Labs  09/12/13 1015  VITAMINB12 348  FOLATE 7.3  FERRITIN 236  TIBC 308  IRON 182*  RETICCTPCT 6.1*   Coags: No results found for this basename: PT, INR,  in the last 72 hours Microbiology: No results found for this or any previous visit (from the past 240 hour(s)).   BRIEF HOSPITAL COURSE:  Anemia  -suspect recent blood loss-subacute blood loss  -admitted and transfused 1 unit of PRBC, Hb stable post transfusion  -since passing black stools (on Fe supplementation)-that was guiac positive- seen by GI, EGD done on 6/14 which showed non bleeding AVM's that was  fulgarated.Patient has appt with Dr Watt Climes on 09/18/13 for further evaluation, she has been asked to keep this appt.  Suspected UGI bleed  -see above  -c/w PPI on discharge  Mild Acute on Chronic Renal Failure stage 3  -likely secondary to above  -monitor lytes periodically   CAD  - Chest pain-free and stable, antiplatelets held upon admission agree. Monitor. Continue beta blocker, Imdur, statin for secondary prevention. If no acute bleeding is found and H&H remains stable we will resume antiplatelet medications with PPI. She has a recent peripheral stent, Dr Candiss Norse discussed with Dr. Irish Lack (notolin stent feb 2015), okay to hold antiplatelet agents for now. Spoke with Dr Michail Sermon, ok to resume Aspirin 81 mg, but to hold Plavix for a week or so. Have asked Cardiology about recommendations since the PCI was done just 4 months back.  Hypertension  -Continue beta blocker, Imdur, clonidine and Norvasc.    History of gastritis  -continue PPI.  Candida Esophagitis -will treat with Fluconazole-this was seen on EGD.   TODAY-DAY OF DISCHARGE:  Subjective:   Kayla Mcclure today has no headache,no chest abdominal pain,no new weakness tingling or numbness, feels much better wants to go home today.   Objective:   Blood pressure 121/66, pulse 61, temperature 98.3 F (36.8 C), temperature source Oral, resp. rate 18, height 4\' 11"  (1.499 m), weight 68.584 kg (151 lb 3.2 oz), SpO2 100.00%.  Intake/Output Summary (Last 24 hours) at 09/14/13 1101 Last data filed at 09/14/13 0959  Gross per 24 hour  Intake    593 ml  Output      0 ml  Net    593 ml   Filed Weights   09/11/13 1444  Weight: 68.584 kg (151 lb 3.2 oz)    Exam Awake Alert, Oriented *3, No new F.N deficits, Normal affect [place].AT,PERRAL Supple Neck,No JVD, No cervical lymphadenopathy appriciated.  Symmetrical Chest wall movement, Good air movement bilaterally, CTAB RRR,No Gallops,Rubs or new Murmurs, No Parasternal Heave +ve  B.Sounds, Abd Soft, Non tender, No organomegaly appriciated, No rebound -guarding or rigidity. No Cyanosis, Clubbing or edema, No new Rash or bruise  DISCHARGE CONDITION: Stable  DISPOSITION: Home  DISCHARGE INSTRUCTIONS:    Activity:  As tolerated   Diet recommendation: Heart Healthy diet  Discharge Instructions   Call MD for:  difficulty breathing, headache or visual disturbances    Complete by:  As directed      Call MD for:  extreme fatigue    Complete by:  As directed      Diet - low sodium heart healthy    Complete by:  As directed      Increase activity slowly    Complete by:  As directed            Follow-up Information   Follow up with Marlou Sa, ERIC, MD. Schedule an appointment as soon as possible for a visit in 1 week.   Specialty:  Internal Medicine   Contact information:   [facility] [place] [address] [phone]       Follow up with [facility], MD On 09/18/2013. (keep your appt)    Specialty:  Gastroenterology   Contact information:   [id number] [place] [address] [phone]       Follow up with Lorretta Harp, MD. Schedule an appointment as soon as possible for a visit in 2 weeks.   Specialty:  Cardiology   Contact information:   [address] [place] [address] [place] [address] [phone]         Total Time spent on discharge equals 45 minutes.  SignedOren Binet 09/14/2013 11:01 AM  **Disclaimer:  This note may have been dictated with voice recognition software. Similar sounding words can inadvertently be transcribed and this note may contain transcription errors which may not have been corrected upon publication of note.**

## 2013-09-14 NOTE — Progress Notes (Signed)
Patient ID: Kayla Mcclure, female   DOB: 08/04/1945, 68 y.o.   MRN: [id number] [facility] Gastroenterology Progress Note  Kayla Mcclure 68 y.o. 15-Apr-1945   Subjective: Feels good. Sitting in chair. Sister in room. Tolerating diet.  Objective: Vital signs in last 24 hours: Filed Vitals:   09/14/13 0758  BP: 121/66  Pulse: 61  Temp: 98.3  Resp: 18    Physical Exam: Gen: alert, no acute distress  Lab Results:  Recent Labs  09/11/13 1600 09/12/13 0445  NA 140 139  K 4.2 4.2  CL 102 105  CO2 21 21  GLUCOSE 116* 143*  BUN 33* 29*  CREATININE 1.59* 1.52*  CALCIUM 9.2 8.6    Recent Labs  09/11/13 1600 09/12/13 0445  AST 15 13  ALT 9 7  ALKPHOS 69 55  BILITOT 0.2* 0.4  PROT 7.9 6.2  ALBUMIN 3.8 3.0*    Recent Labs  09/13/13 0814 09/14/13 0558  WBC 7.9 7.1  HGB 10.3* 9.8*  HCT 32.2* 30.7*  MCV 86.8 88.0  PLT 241 228   No results found for this basename: LABPROT, INR,  in the last 72 hours    Assessment/Plan: 68 yo with anemia and black stools on iron but gastric AVM likely contributed to anemia. S/P APC of AVM. Doing well. Would recommend holding Plavix 1 week if ok with cardiology and ok to continue baby Aspirin. Patient should keep f/u with Dr. Watt Climes for Friday and he can decide on timing of a repeat colonoscopy. Ok to go home from GI standpoint. Will sign off. Call if questions.   [place] C. 09/14/2013, 11:00 AM

## 2013-09-14 NOTE — Consult Note (Signed)
CARDIOLOGY CONSULT NOTE       Patient ID: Kayla Mcclure MRN: [id number] DOB/AGE: 11/07/1945 68 y.o.  Admit date: 09/11/2013 Referring Physician:  Lovena Neighbours Primary Physician: Kevan Ny, MD Primary Cardiologist:  Harding/ Berr Reason for Consultation:  Management of anticoagulation  Active Problems:   Iron deficiency anemia   Essential hypertension, benign   CAD S/P percutaneous coronary angioplasty -- Promus DES 2.5 mm x 20 mm postdilated to 3 mm   Asthma   CKD, stage 2   Anemia   HPI:   68 yo black female with history of PVD and CAD.  CAD has been stable.  DES to LAD back in 2009.  Nonischemic myovue 3/14 with EF 81%   Not clear why she has been maintained on DAT.  She has not had any chest pain.  She was admitted  With tarry stools and GI bleed.  Endoscopy showed small gastric AVM that was fulgarated and mild esophagitis.  Hct is stable around 30.  Stools have cleared.  She also has PVD with previous  Stenting of the left iliac 2/15 by Dr Gwenlyn Found.  Successful with relief of claudication and normal ABI;s  CRF;s  HTN and elevated lipids on Rx  Has carotid disease with 50-60% RICA due for f/u duplex 1/16  No history of TIA or CVA.  She indicates being ready for d/c with outpatient GI f/u Dr Watt Climes.    ROS All other systems reviewed and negative except as noted above  Past Medical History  Diagnosis Date  . Hypertension     a. Normal renal arteries by PV angio 05/2013.  Marland Kitchen Dyslipidemia, goal LDL below 70   . Internal hemorrhoids  04/11/2010    By colonoscopy  . Diverticulosis   04/11/2010     Colonoscopy  . Insomnia   . Asthma  10/31/2007  . Chronic back pain   . Iron deficiency anemia   . GERD (gastroesophageal reflux disease)   . Peripheral arterial disease     a. s/p PTA and stenting of L CIA stenosis 05/2013.  Marland Kitchen CAD (coronary artery disease) 08/05/2007    a. NSTEMI 2009 s/p PCI to the RCA Promus DES 2.5 mm x 23 mm (3.0 mm). b. Myoview 05/2012: inferolateral ST changes that  were more pronounced w/exercise, but study was essentially normal w/no evidence of ischemia, EF = 81%.   . CKD (chronic kidney disease), stage II   . Tobacco abuse   . Carotid artery disease     a. 50-69% RICA by outpt dopp 04/22/13, f/u due 04/2014.    Family History  Problem Relation Age of Onset  . Diabetes Mother   . Heart disease Mother   . Diabetes Sister   . Heart attack Mother 2  . Heart attack Father 41  . Breast cancer Sister 44  . Breast cancer Mother 82  . Colon cancer Mother 73    History   Social History  . Marital Status: Married    Spouse Name: N/A    Number of Children: 1  . Years of Education: N/A   Occupational History  .     Social History Main Topics  . Smoking status: Former Smoker    Types: Cigarettes    Quit date: 05/04/2013  . Smokeless tobacco: Never Used  . Alcohol Use: Yes     Comment: occasionally 3 times a year  . Drug Use: No  . Sexual Activity: No   Other Topics Concern  . Not on file  Social History Narrative   Married. Has one child. Grandmother for a great-grandmother and 1.   No real exercise.   Currently smokes 7 or less alcohol today (sometimes she says they more chest burn out of the ashtray).   Denies alcohol consumption.    Past Surgical History  Procedure Laterality Date  . Colonoscopy  04/11/2010  . Iliac vein angioplasty / stenting Left 05/04/2013    common/notes 05/04/2013  . Tonsillectomy  1960's  . Cholecystectomy  1975  . Coronary angioplasty with stent placement  08/05/2007    Promus DES 2.5 mm x 23 mm (3 mm)  to RCA; EF 60-70%  . Esophagogastroduodenoscopy N/A 09/13/2013    Procedure: ESOPHAGOGASTRODUODENOSCOPY (EGD);  Surgeon: Lear Ng, MD;  Location: [facility] ENDOSCOPY;  Service: Endoscopy;  Laterality: N/A;     . amLODipine  5 mg Oral Daily  . atorvastatin  40 mg Oral q1800  . cloNIDine  0.1 mg Oral Daily  . ferrous sulfate  325 mg Oral Q breakfast  . isosorbide mononitrate  30 mg Oral Daily  .  metoprolol succinate  150 mg Oral Q breakfast  . mometasone-formoterol  2 puff Inhalation BID  . multivitamin with minerals  1 tablet Oral Daily  . pantoprazole  40 mg Oral BID  . sodium chloride  3 mL Intravenous Q12H      Physical Exam: Blood pressure 120/72, pulse 61, temperature 98.3 F (36.8 C), temperature source Oral, resp. rate 18, height 4\' 11"  (1.499 m), weight 151 lb 3.2 oz (68.584 kg), SpO2 100.00%.    Affect appropriate Elderly black female HEENT: normal Neck supple with no adenopathy JVP normal no bruits no thyromegaly Lungs clear with no wheezing and good diaphragmatic motion Heart:  S1/S2 no murmur, no rub, gallop or click PMI normal Abdomen: benighn, BS positve, no tenderness, no AAA no bruit.  No HSM or HJR Distal pulses intact with left femoral  bruits No edema Neuro non-focal Skin warm and dry No muscular weakness   Labs:   Lab Results  Component Value Date   WBC 7.1 09/14/2013   HGB 9.8* 09/14/2013   HCT 30.7* 09/14/2013   MCV 88.0 09/14/2013   PLT 228 09/14/2013    Recent Labs Lab 09/12/13 0445  NA 139  K 4.2  CL 105  CO2 21  BUN 29*  CREATININE 1.52*  CALCIUM 8.6  PROT 6.2  BILITOT 0.4  ALKPHOS 55  ALT 7  AST 13  GLUCOSE 143*   Lab Results  Component Value Date   TROPONINI <0.30 06/03/2012    Lab Results  Component Value Date   CHOL 149 06/13/2010   CHOL 154 06/07/2009   CHOL 142 07/20/2008   Lab Results  Component Value Date   HDL 47 06/13/2010   HDL 53 06/07/2009   HDL 44 07/20/2008   Lab Results  Component Value Date   LDLCALC 82 06/13/2010   LDLCALC 80 06/07/2009   LDLCALC 71 07/20/2008   Lab Results  Component Value Date   TRIG 98 06/13/2010   TRIG 107 06/07/2009   TRIG 135 07/20/2008   Lab Results  Component Value Date   CHOLHDL 3.2 Ratio 06/13/2010   CHOLHDL 2.9 Ratio 06/07/2009   CHOLHDL 3.2 Ratio 07/20/2008   No results found for this basename: LDLDIRECT      Radiology: No results found.  EKG:  SR rate 79  normal   ASSESSMENT AND PLAN:  CAD:  No angina  Large DES stent to LAD in  2009  Non-ischemic myovue 3/14  D/C with ASA 81mg   And ok to stop plavix  Would hold until  Sees Dr Ellyn Hack in f/u.  Continue beta blocker and nitrates PVD:  Stable relief of claudication with stenting left iliac.  ABI's ok  F/u Dr Gwenlyn Found Carotid:  50-60%  RICA  No bruit on exam ASA  F/u duplex 1/16 HTN:  Stable continue current meds Chol:  Continue statin    Signed: Jenkins Rouge 09/14/2013, 12:38 PM

## 2013-09-14 NOTE — Care Management Note (Signed)
    Page 1 of 1   09/14/2013     3:08:33 PM CARE MANAGEMENT NOTE 09/14/2013  Patient:  Kayla Mcclure, Kayla Mcclure   Account Number:  [id number]  Date Initiated:  09/14/2013  Documentation initiated by:  Tomi Bamberger  Subjective/Objective Assessment:   dx anemia, poss gib  admit- lives with spouse, pta indep.     Action/Plan:   Anticipated DC Date:  09/14/2013   Anticipated DC Plan:  [place]  CM consult      Choice offered to / List presented to:             Status of service:  Completed, signed off Medicare Important Message given?  YES (If response is "NO", the following Medicare IM given date fields will be blank) Date Medicare IM given:  09/14/2013 Date Additional Medicare IM given:    Discharge Disposition:  HOME/SELF CARE  Per UR Regulation:  Reviewed for med. necessity/level of care/duration of stay  If discussed at [place] of Stay Meetings, dates discussed:    Comments:  09/14/13 [place], BSN [phone] patient for dc today, pta indep.  Additional IM given.

## 2013-09-14 NOTE — Progress Notes (Signed)
NURSING PROGRESS NOTE  Kayla Mcclure [id number] Discharge Data: 09/14/2013 2:14 PM Attending Provider: Jonetta Osgood, MD [id number], ERIC, MD     Kayla Mcclure to be D/C'd Home per MD order.  Discussed with the patient the After Visit Summary and all questions fully answered. All IV's discontinued with no bleeding noted. All belongings returned to patient for patient to take home.   Last Vital Signs:  Blood pressure 101/64, pulse 57, temperature 97.6 F (36.4 C), temperature source Oral, resp. rate 16, height 4\' 11"  (1.499 m), weight 68.584 kg (151 lb 3.2 oz), SpO2 95.00%.  Discharge Medication List   Medication List    STOP taking these medications       aspirin EC 81 MG tablet     clopidogrel 75 MG tablet  Commonly known as:  PLAVIX     famotidine 40 MG tablet  Commonly known as:  PEPCID      TAKE these medications       albuterol 108 (90 BASE) MCG/ACT inhaler  Commonly known as:  PROVENTIL HFA;VENTOLIN HFA  Inhale 2 puffs into the lungs every 4 (four) hours as needed. For shortness of breath     amLODipine 5 MG tablet  Commonly known as:  NORVASC  Take 5 mg by mouth daily.     atorvastatin 40 MG tablet  Commonly known as:  LIPITOR  Take 40 mg by mouth daily.     cloNIDine 0.1 MG tablet  Commonly known as:  CATAPRES  Take 1 tablet by mouth daily.     ferrous sulfate 325 (65 FE) MG EC tablet  Take 325 mg by mouth daily with breakfast.     fluconazole 100 MG tablet  Commonly known as:  DIFLUCAN  Take 1 tablet (100 mg total) by mouth daily.     Fluticasone-Salmeterol 100-50 MCG/DOSE Aepb  Commonly known as:  ADVAIR  Inhale 1 puff into the lungs 2 (two) times daily as needed (shortness of breath).     hydrOXYzine 50 MG tablet  Commonly known as:  ATARAX/VISTARIL  Take 50 mg by mouth at bedtime as needed.     isosorbide mononitrate 30 MG 24 hr tablet  Commonly known as:  IMDUR  Take 1 tablet (30 mg total) by mouth daily.     loratadine 10 MG tablet   Commonly known as:  CLARITIN  Take 10 mg by mouth daily as needed for allergies.     metoprolol succinate 100 MG 24 hr tablet  Commonly known as:  TOPROL-XL  Take 150 mg by mouth daily. Take with or immediately following a meal.     nitroGLYCERIN 0.4 MG SL tablet  Commonly known as:  NITROSTAT  Place 1 tablet (0.4 mg total) under the tongue every 5 (five) minutes as needed for chest pain.     OYSTER CALCIUM 500 + D 500-200 MG-UNIT per tablet  Generic drug:  calcium-vitamin D  Take 1 tablet by mouth daily with breakfast.     pantoprazole 40 MG tablet  Commonly known as:  PROTONIX  Take 1 tablet (40 mg total) by mouth daily.     ranitidine 150 MG tablet  Commonly known as:  ZANTAC  Take 2 tablets by mouth 2 (two) times daily as needed for heartburn.         Kayla Renshaw, RN

## 2013-09-23 ENCOUNTER — Encounter: Payer: Self-pay | Admitting: Cardiology

## 2013-09-23 ENCOUNTER — Ambulatory Visit (INDEPENDENT_AMBULATORY_CARE_PROVIDER_SITE_OTHER): Payer: Medicare Other | Admitting: Cardiology

## 2013-09-23 VITALS — BP 114/70 | HR 64 | Ht 59.0 in

## 2013-09-23 DIAGNOSIS — F17200 Nicotine dependence, unspecified, uncomplicated: Secondary | ICD-10-CM

## 2013-09-23 DIAGNOSIS — I739 Peripheral vascular disease, unspecified: Secondary | ICD-10-CM

## 2013-09-23 DIAGNOSIS — N182 Chronic kidney disease, stage 2 (mild): Secondary | ICD-10-CM

## 2013-09-23 DIAGNOSIS — E785 Hyperlipidemia, unspecified: Secondary | ICD-10-CM

## 2013-09-23 DIAGNOSIS — K5730 Diverticulosis of large intestine without perforation or abscess without bleeding: Secondary | ICD-10-CM

## 2013-09-23 DIAGNOSIS — I209 Angina pectoris, unspecified: Secondary | ICD-10-CM

## 2013-09-23 DIAGNOSIS — D500 Iron deficiency anemia secondary to blood loss (chronic): Secondary | ICD-10-CM

## 2013-09-23 DIAGNOSIS — Z9861 Coronary angioplasty status: Secondary | ICD-10-CM

## 2013-09-23 DIAGNOSIS — E669 Obesity, unspecified: Secondary | ICD-10-CM

## 2013-09-23 DIAGNOSIS — I10 Essential (primary) hypertension: Secondary | ICD-10-CM

## 2013-09-23 DIAGNOSIS — K2901 Acute gastritis with bleeding: Secondary | ICD-10-CM

## 2013-09-23 DIAGNOSIS — D509 Iron deficiency anemia, unspecified: Secondary | ICD-10-CM

## 2013-09-23 DIAGNOSIS — R0989 Other specified symptoms and signs involving the circulatory and respiratory systems: Secondary | ICD-10-CM

## 2013-09-23 DIAGNOSIS — I2510 Atherosclerotic heart disease of native coronary artery without angina pectoris: Secondary | ICD-10-CM

## 2013-09-23 NOTE — Patient Instructions (Addendum)
Your physician has requested that you have a carotid duplex. This test is an ultrasound of the carotid arteries in your neck. It looks at blood flow through these arteries that supply the brain with blood. Allow one hour for this exam. There are no restrictions or special instructions. Schedule appointment will be in Jan 2016     Your physician wants you to follow-up in [place]. You will receive a reminder letter in the mail two months in advance. If you don't receive a letter, please call our office to schedule the follow-up appointment.

## 2013-09-27 ENCOUNTER — Encounter: Payer: Self-pay | Admitting: Cardiology

## 2013-09-27 DIAGNOSIS — K922 Gastrointestinal hemorrhage, unspecified: Secondary | ICD-10-CM | POA: Insufficient documentation

## 2013-09-27 NOTE — Assessment & Plan Note (Signed)
I think the addition of PAD to her CAD finaly drove her to quit smoking.  She is now ~4 months out from quitting. I congratulated her.Marland Kitchen

## 2013-09-27 NOTE — Assessment & Plan Note (Signed)
S/p vessel sclerosing.  Hgb has been stable -- due for follow up labs.  If OK with GI - would restart 81mg  ASA (if not - that is fine). Wait 6 weeks - if stable,restart Plavx alone -- is on DAPT for PAD, CAD; with mild recurrence in L leg pain - would defer to Dr. Gwenlyn Found re need for Plavix long term.

## 2013-09-27 NOTE — Assessment & Plan Note (Signed)
No angina or CHF symptoms.   On stable regimen of high dose Toprol along with Imdur.  On statin. Recent Myoview was Normal

## 2013-09-27 NOTE — Assessment & Plan Note (Signed)
From GI Bleed -- if Hgb continues to fall - stop Plavix for good.

## 2013-09-27 NOTE — Assessment & Plan Note (Signed)
Again re-iterated the importance of increasing exercise activities & dietary modification.

## 2013-09-27 NOTE — Assessment & Plan Note (Signed)
Stable on Imdur. Has not needed PRN NTG.

## 2013-09-27 NOTE — Assessment & Plan Note (Signed)
Stable. Also monitored by Nephrologist.

## 2013-09-27 NOTE — Progress Notes (Signed)
PCP: Antony Blackbird, MD  Clinic Note: Chief Complaint  Patient presents with  . 6 month visit    post hosp 09/11/13- stopped plavix at that time ?if she need to restart,--no chest pain , no sob , slight edema, right leg numbness, left leg pain   HPI: Kayla Mcclure is a 68 y.o. female with a Cardiovascular Problem List below who presents today for post hospital follow-up for GI bleed ~09/11/2013 --- 2 vessels sclerosed by Dr. Michail Sermon (GI). Per Cardiology c/s -- Plavix & ASA stopped. She has CAD s/p NSTEMI in 2009 - PCI to RCA as well as PAD s/p L CIA-EIA stent in 05/2013. EGD in 07/2013 - Dr. Watt Climes - acute gastritis; PCP noted drop in Hgb to 7.5 --> to ER (noted dark stools & profound weakness / dizziness). 2 Units PRBC. D/c ASA/Plavix.  Interval History: She presents today relatively stable from a Cardiac standpoint -- no rest or exertional angina or dyspnea; no palpitations, syncope/near-syncope or TIA/Amaurosis Fugax symptoms.  She has not had any further dizziness since her transfusion.  She can tell the difference from supplemental Iron caused dark stool & her previous melenotic stools.  No hematochezia, hematemesis, or epistaxis.  She dose note L sided leg pain since discharge & R foot numbness. No real claudication besides intermterittent L calf pain, but more of a resting pain in the L foot.  Past Medical History  Diagnosis Date  . Essential hypertension     a. Normal renal arteries by PV angio 05/2013.  Marland Kitchen Dyslipidemia, goal LDL below 70   . Internal hemorrhoids  04/11/2010    By colonoscopy  . Diverticulosis   04/11/2010     Colonoscopy  . Insomnia   . Asthma  10/31/2007  . Chronic back pain   . Iron deficiency anemia   . GERD (gastroesophageal reflux disease)   . Peripheral arterial disease     a. s/p PTA and stenting of L CIA stenosis 05/2013.  Marland Kitchen CAD (coronary artery disease) 08/05/2007    a) NSTEMI 2009: PCI to the RCA Promus DES 2.5 mm x 23 mm (3.0 mm). b) Myoview 05/2012:  inferolateral ST changes that were more pronounced w/exercise, Imaging w/o Infarct or Ischemia, EF = 81%.   . CKD (chronic kidney disease), stage II   . Tobacco abuse     Quit in 05/2013  . Carotid artery disease     a. 50-69% RICA by outpt dopp 04/22/13, f/u due 04/2014.    Prior Cardiac Evaluation and Past Surgical History: Past Surgical History  Procedure Laterality Date  . Colonoscopy  04/11/2010  . Iliac artery stent Left 05/04/2013    L CIA-EIA -- Dr. Gwenlyn Found  . Coronary angioplasty with stent placement  08/05/2007    Promus DES 2.5 mm x 23 mm (3 mm)  to RCA; EF 60-70%  . Esophagogastroduodenoscopy N/A 09/13/2013    Procedure: ESOPHAGOGASTRODUODENOSCOPY (EGD);  Surgeon: Lear Ng, MD;  Location: [facility] ENDOSCOPY;  Service: Endoscopy;  Laterality: N/A;   MEDICATIONS AND ALLERGIES REVIEWED IN EPIC No Change in Social and Family History  ROS: A comprehensive Review of Systems - Negative except symptoms noted in HPI  PHYSICAL EXAM BP 114/70  Pulse 64  Ht 4\' 11"  (1.499 m) General appearance: alert and oriented x3, cooperative, appears stated age, no distress and mildly obese  Neck: no adenopathy, Soft R Carotid bruit, no JVD and supple, symmetrical, trachea midline  Lungs: CTAB, normal percussion bilaterally and Nonlabored, good air movement  Heart: regular rate and rhythm, S1, S2 normal, no murmur, click, rub or gallop and normal apical impulse  Abdomen: soft, non-tender; bowel sounds normal; no masses, no organomegaly  Extremities: extremities normal, atraumatic, no cyanosis or edema and venous stasis dermatitis noted  Pulses: Bilateral radial pulses are 2+. 1-2+ BLE DP & PT.  Neurologic: Grossly intact   Adult ECG Report  Rate: 64 ;  Rhythm: normal sinus rhythm - Normal EKG   Recent Labs 6/15:   Hgb 9.8  ASSESSMENT / PLAN: GI bleed S/p vessel sclerosing.  Hgb has been stable -- due for follow up labs.  If OK with GI - would restart 81mg  ASA (if not - that is  fine). Wait 6 weeks - if stable,restart Plavx alone -- is on DAPT for PAD, CAD; with mild recurrence in L leg pain - would defer to Dr. Gwenlyn Found re need for Plavix long term.    Iron deficiency anemia From GI Bleed -- if Hgb continues to fall - stop Plavix for good.  CAD S/P percutaneous coronary angioplasty -- Promus DES 2.5 mm x 20 mm postdilated to 3 mm No angina or CHF symptoms.   On stable regimen of high dose Toprol along with Imdur.  On statin. Recent Myoview was Normal   Angina, class I Stable on Imdur. Has not needed PRN NTG.  PAD (peripheral artery disease) - bilateral CIA & CFA 50-69%; Claudication Post PTA dopplers with excellent flow.  I doubt that her leg pain is related to re-stenosis.   I am not sure of the duration ofd Plavix recommended  Essential hypertension, benign Stable. Also monitored by Nephrologist.  Dyslipidemia, goal LDL below 70 Followed by her PCP.  Would like to have results for records  Smokes tobacco daily I think the addition of PAD to her CAD finaly drove her to quit smoking.  She is now ~4 months out from quitting. I congratulated her..  Obesity (BMI 30-39.9) Again re-iterated the importance of increasing exercise activities & dietary modification.    Orders Placed This Encounter  Procedures  . EKG 12-Lead   Meds ordered this encounter  Medications  . hydrochlorothiazide (HYDRODIURIL) 25 MG tablet    Sig: Take 25 mg by mouth daily.  Marland Kitchen DISCONTD: traZODone (DESYREL) 50 MG tablet    Sig:     Followup: 7 months  DAVID W. Ellyn Hack, M.D., M.S. Interventional Cardiologist [facility]

## 2013-09-27 NOTE — Assessment & Plan Note (Signed)
Post PTA dopplers with excellent flow.  I doubt that her leg pain is related to re-stenosis.   I am not sure of the duration ofd Plavix recommended

## 2013-09-27 NOTE — Assessment & Plan Note (Signed)
Followed by her PCP.  Would like to have results for records

## 2013-12-01 ENCOUNTER — Telehealth (HOSPITAL_COMMUNITY): Payer: Self-pay | Admitting: *Deleted

## 2013-12-14 ENCOUNTER — Other Ambulatory Visit: Payer: Self-pay | Admitting: Gastroenterology

## 2013-12-30 ENCOUNTER — Other Ambulatory Visit: Payer: Self-pay | Admitting: Family Medicine

## 2013-12-30 DIAGNOSIS — R1031 Right lower quadrant pain: Secondary | ICD-10-CM

## 2014-02-01 ENCOUNTER — Encounter: Payer: Self-pay | Admitting: Cardiology

## 2014-02-04 DIAGNOSIS — Z87891 Personal history of nicotine dependence: Secondary | ICD-10-CM | POA: Insufficient documentation

## 2014-03-11 ENCOUNTER — Encounter (HOSPITAL_COMMUNITY): Payer: Self-pay | Admitting: Cardiovascular Disease

## 2014-03-15 ENCOUNTER — Other Ambulatory Visit: Payer: Self-pay | Admitting: Family Medicine

## 2014-03-15 DIAGNOSIS — E2839 Other primary ovarian failure: Secondary | ICD-10-CM

## 2014-03-15 DIAGNOSIS — Z1231 Encounter for screening mammogram for malignant neoplasm of breast: Secondary | ICD-10-CM

## 2014-04-07 ENCOUNTER — Telehealth (HOSPITAL_COMMUNITY): Payer: Self-pay | Admitting: *Deleted

## 2014-04-16 ENCOUNTER — Ambulatory Visit
Admission: RE | Admit: 2014-04-16 | Discharge: 2014-04-16 | Disposition: A | Discharge status: Home / Self Care | Payer: Medicare Other | Source: Ambulatory Visit | Attending: Family Medicine | Admitting: Family Medicine

## 2014-04-16 DIAGNOSIS — E2839 Other primary ovarian failure: Secondary | ICD-10-CM

## 2014-04-16 DIAGNOSIS — Z1231 Encounter for screening mammogram for malignant neoplasm of breast: Secondary | ICD-10-CM

## 2014-04-30 ENCOUNTER — Other Ambulatory Visit (HOSPITAL_COMMUNITY): Payer: Self-pay | Admitting: Cardiovascular Disease

## 2014-04-30 DIAGNOSIS — I739 Peripheral vascular disease, unspecified: Secondary | ICD-10-CM

## 2014-04-30 DIAGNOSIS — R0989 Other specified symptoms and signs involving the circulatory and respiratory systems: Secondary | ICD-10-CM

## 2014-05-04 ENCOUNTER — Encounter (HOSPITAL_COMMUNITY): Payer: Medicare Other

## 2014-05-06 ENCOUNTER — Ambulatory Visit (HOSPITAL_COMMUNITY)
Admission: RE | Admit: 2014-05-06 | Discharge: 2014-05-06 | Disposition: A | Discharge status: Home / Self Care | Payer: Medicare Other | Source: Ambulatory Visit | Attending: Internal Medicine | Admitting: Internal Medicine

## 2014-05-06 DIAGNOSIS — I739 Peripheral vascular disease, unspecified: Secondary | ICD-10-CM | POA: Diagnosis not present

## 2014-05-06 DIAGNOSIS — R0989 Other specified symptoms and signs involving the circulatory and respiratory systems: Secondary | ICD-10-CM | POA: Insufficient documentation

## 2014-05-06 NOTE — Progress Notes (Signed)
Carotid Duplex Completed. °Brianna L Mazza,RVT °

## 2014-05-06 NOTE — Progress Notes (Signed)
Lower Extremity Arterial Duplex Completed. °Brianna L Mazza,RVT °

## 2014-05-12 ENCOUNTER — Encounter: Payer: Self-pay | Admitting: *Deleted

## 2014-09-16 ENCOUNTER — Encounter: Payer: Self-pay | Admitting: Cardiology

## 2014-09-27 ENCOUNTER — Encounter: Payer: Self-pay | Admitting: Cardiology

## 2014-09-27 NOTE — Progress Notes (Signed)
Quick Note:  Recent Labs: 6/10 Na+ 138, K+ 4.3, Cl- 104, HCO3- 28 , BUN 14, Cr 1.07, Glu 93, Ca2+ 9.4;  CBC: H/H 10.7/33.7,  TC 188, TG 70, HDL 63, LDL 111  - Hgb stable from last year - HDL looks good, but LDL went up --> will need more aggressive Rx given CAD-CABG.  DH HARDING, DAVID W, MD  Please fwd to PCP: Antony Blackbird, MD   ______

## 2015-07-02 HISTORY — PX: OTHER SURGICAL HISTORY: SHX169

## 2015-07-08 ENCOUNTER — Ambulatory Visit (INDEPENDENT_AMBULATORY_CARE_PROVIDER_SITE_OTHER): Payer: Medicare Other | Admitting: Cardiovascular Disease

## 2015-07-08 ENCOUNTER — Encounter: Payer: Self-pay | Admitting: Cardiovascular Disease

## 2015-07-08 VITALS — BP 142/76 | HR 69 | Ht 59.0 in | Wt 142.0 lb

## 2015-07-08 DIAGNOSIS — I779 Disorder of arteries and arterioles, unspecified: Secondary | ICD-10-CM | POA: Diagnosis not present

## 2015-07-08 DIAGNOSIS — I209 Angina pectoris, unspecified: Secondary | ICD-10-CM

## 2015-07-08 DIAGNOSIS — I739 Peripheral vascular disease, unspecified: Secondary | ICD-10-CM

## 2015-07-08 MED ORDER — ISOSORBIDE MONONITRATE ER 60 MG PO TB24: 60.0000 mg | ORAL_TABLET | Freq: Every day | ORAL | Status: DC

## 2015-07-08 NOTE — Patient Instructions (Signed)
Medication Instructions:  Your physician has recommended you make the following change in your medication:  1) INCREASE Imdur to 60 mg by mouth ONCE daily   Labwork: none  Testing/Procedures: Your physician has requested that you have a lower extremity arterial doppler- During this test, ultrasound is used to evaluate arterial blood flow in the legs. Allow approximately one hour for this exam.   Your physician has requested that you have a carotid duplex. This test is an ultrasound of the carotid arteries in your neck. It looks at blood flow through these arteries that supply the brain with blood. Allow one hour for this exam. There are no restrictions or special instructions.   Follow-Up: Your physician wants you to follow-up in: 12 months with Dr. Gwenlyn Found. You will receive a reminder letter in the mail two months in advance. If you don't receive a letter, please call our office to schedule the follow-up appointment.   Any Other Special Instructions Will Be Listed Below (If Applicable).     If you need a refill on your cardiac medications before your next appointment, please call your pharmacy.

## 2015-07-08 NOTE — Progress Notes (Signed)
07/08/2015 Kayla Mcclure   15-Apr-1945  [id number]  Primary Physician FULP, CAMMIE, MD Primary Cardiologist: Lorretta Harp MD Renae Gloss   HPI:  Kayla Mcclure is a 70 year old African American female accompanied by her sister today. She was referred by Dr. Ellyn Hack. I last saw her in the office 06/23/13. She has a history of CAD, hypertension, tobacco abuse and hyperlipidemia. She does have lifestyle limiting claudication with Dopplers that showed stenoses in the right common femoral and left iliac artery. She denies chest pain or shortness of breath. She underwent percutaneous revascularization of her left common and external iliac artery by myself on 05/04/13 with excellent angiographic and clinical result. Her postprocedure Dopplers performed several days later were entirely normal. She also has moderate bilateral internal carotid artery stenosis. She stopped smoking a year ago. She's had recurrent claudication bilaterally as well as chest pain radiating to her jaw.  Current Outpatient Prescriptions  Medication Sig Dispense Refill  . albuterol (PROVENTIL HFA;VENTOLIN HFA) 108 (90 BASE) MCG/ACT inhaler Inhale 2 puffs into the lungs every 4 (four) hours as needed. For shortness of breath    . amLODipine (NORVASC) 5 MG tablet Take 5 mg by mouth daily.    Marland Kitchen atorvastatin (LIPITOR) 40 MG tablet Take 40 mg by mouth daily.    . ferrous sulfate 325 (65 FE) MG EC tablet Take 325 mg by mouth daily with breakfast.    . Fluticasone-Salmeterol (ADVAIR) 100-50 MCG/DOSE AEPB Inhale 1 puff into the lungs 2 (two) times daily as needed (shortness of breath).    . hydrochlorothiazide (HYDRODIURIL) 25 MG tablet Take 25 mg by mouth daily.    . isosorbide mononitrate (IMDUR) 30 MG 24 hr tablet Take 1 tablet (30 mg total) by mouth daily. 30 tablet 11  . loratadine (CLARITIN) 10 MG tablet Take 10 mg by mouth daily as needed for allergies.    . metoprolol succinate (TOPROL-XL) 100 MG 24 hr tablet Take 150  mg by mouth daily. Take with or immediately following a meal.    . nitroGLYCERIN (NITROSTAT) 0.4 MG SL tablet Place 1 tablet (0.4 mg total) under the tongue every 5 (five) minutes as needed for chest pain. 25 tablet 6  . pantoprazole (PROTONIX) 40 MG tablet Take 1 tablet (40 mg total) by mouth daily. 30 tablet 0   No current facility-administered medications for this visit.    No Known Allergies  Social History   Social History  . Marital Status: Married    Spouse Name: N/A  . Number of Children: 1  . Years of Education: N/A   Occupational History  .     Social History Main Topics  . Smoking status: Former Smoker    Types: Cigarettes    Quit date: 05/04/2013  . Smokeless tobacco: Never Used  . Alcohol Use: Yes     Comment: occasionally 3 times a year  . Drug Use: No  . Sexual Activity: No   Other Topics Concern  . Not on file   Social History Narrative   Married. Has one child. Grandmother for a great-grandmother and 1.   No real exercise.   Denies alcohol consumption.   Quit smoking inf Feb 2015 -- after L Iliac Stent     Review of Systems: General: negative for chills, fever, night sweats or weight changes.  Cardiovascular: negative for chest pain, dyspnea on exertion, edema, orthopnea, palpitations, paroxysmal nocturnal dyspnea or shortness of breath Dermatological: negative for rash Respiratory: negative for cough or  wheezing Urologic: negative for hematuria Abdominal: negative for nausea, vomiting, diarrhea, bright red blood per rectum, melena, or hematemesis Neurologic: negative for visual changes, syncope, or dizziness All other systems reviewed and are otherwise negative except as noted above.    Blood pressure 142/76, pulse 69, height 4\' 11"  (1.499 m), weight 142 lb (64.411 kg).  General appearance: alert and no distress Neck: no adenopathy, no JVD, supple, symmetrical, trachea midline, thyroid not enlarged, symmetric, no tenderness/mass/nodules and  bilateral carotid bruits Lungs: clear to auscultation bilaterally Heart: regular rate and rhythm, S1, S2 normal, no murmur, click, rub or gallop Extremities: extremities normal, atraumatic, no cyanosis or edema  EKG normal sinus rhythm of 69 without ST or T-wave changes. I personally reviewed his EKG  ASSESSMENT AND PLAN:   PAD (peripheral artery disease) - bilateral CIA & CFA 50-69%; Claudication History of peripheral arterial disease status post left common and external iliac artery stenting by myself 05/04/13 with lower extremity arterial Doppler studies performed 2 days later that were entirely normal. She enjoyed symptomatically for approximately urinalysis had recurrent symptoms in both legs. I'm going to get lower extremity arterial Doppler studies on her to further evaluate  Bilateral carotid artery disease (HCC) History of bilateral internal carotid artery stenosis by duplex ultrasound last checked 05/06/14. She does have loud bruits bilaterally. We will recheck carotid Doppler studies      Lorretta Harp MD [facility], [facility] 07/08/2015 4:27 PM

## 2015-07-08 NOTE — Addendum Note (Signed)
Addended by: Newt Minion on: 07/08/2015 04:55 PM   Modules accepted: Orders

## 2015-07-08 NOTE — Assessment & Plan Note (Signed)
History of peripheral arterial disease status post left common and external iliac artery stenting by myself 05/04/13 with lower extremity arterial Doppler studies performed 2 days later that were entirely normal. She enjoyed symptomatically for approximately urinalysis had recurrent symptoms in both legs. I'm going to get lower extremity arterial Doppler studies on her to further evaluate

## 2015-07-08 NOTE — Assessment & Plan Note (Signed)
History of bilateral internal carotid artery stenosis by duplex ultrasound last checked 05/06/14. She does have loud bruits bilaterally. We will recheck carotid Doppler studies

## 2015-07-12 ENCOUNTER — Other Ambulatory Visit: Payer: Self-pay | Admitting: Cardiovascular Disease

## 2015-07-12 DIAGNOSIS — I209 Angina pectoris, unspecified: Secondary | ICD-10-CM

## 2015-07-12 DIAGNOSIS — I779 Disorder of arteries and arterioles, unspecified: Secondary | ICD-10-CM

## 2015-07-12 DIAGNOSIS — I739 Peripheral vascular disease, unspecified: Secondary | ICD-10-CM

## 2015-07-13 ENCOUNTER — Ambulatory Visit (INDEPENDENT_AMBULATORY_CARE_PROVIDER_SITE_OTHER): Payer: Medicare Other | Admitting: Cardiology

## 2015-07-13 ENCOUNTER — Encounter: Payer: Self-pay | Admitting: Cardiology

## 2015-07-13 ENCOUNTER — Other Ambulatory Visit: Payer: Self-pay | Admitting: *Deleted

## 2015-07-13 VITALS — BP 162/72 | HR 85 | Ht 59.0 in | Wt 140.6 lb

## 2015-07-13 DIAGNOSIS — I2510 Atherosclerotic heart disease of native coronary artery without angina pectoris: Secondary | ICD-10-CM

## 2015-07-13 DIAGNOSIS — I10 Essential (primary) hypertension: Secondary | ICD-10-CM | POA: Diagnosis not present

## 2015-07-13 DIAGNOSIS — E785 Hyperlipidemia, unspecified: Secondary | ICD-10-CM

## 2015-07-13 DIAGNOSIS — I209 Angina pectoris, unspecified: Secondary | ICD-10-CM | POA: Diagnosis not present

## 2015-07-13 DIAGNOSIS — I779 Disorder of arteries and arterioles, unspecified: Secondary | ICD-10-CM

## 2015-07-13 DIAGNOSIS — Z9861 Coronary angioplasty status: Secondary | ICD-10-CM

## 2015-07-13 DIAGNOSIS — R42 Dizziness and giddiness: Secondary | ICD-10-CM | POA: Insufficient documentation

## 2015-07-13 DIAGNOSIS — I739 Peripheral vascular disease, unspecified: Secondary | ICD-10-CM

## 2015-07-13 LAB — CBC WITH DIFFERENTIAL/PLATELET
BASOS PCT: 0 %
Basophils Absolute: 0 cells/uL (ref 0–200)
EOS ABS: 148 {cells}/uL (ref 15–500)
Eosinophils Relative: 2 %
HEMATOCRIT: 22.2 % — AB (ref 35.0–45.0)
Hemoglobin: 6.4 g/dL — CL (ref 11.7–15.5)
LYMPHS PCT: 23 %
Lymphs Abs: 1702 cells/uL (ref 850–3900)
MCH: 19.9 pg — ABNORMAL LOW (ref 27.0–33.0)
MCHC: 28.8 g/dL — ABNORMAL LOW (ref 32.0–36.0)
MCV: 69.2 fL — ABNORMAL LOW (ref 80.0–100.0)
Monocytes Absolute: 888 cells/uL (ref 200–950)
Monocytes Relative: 12 %
Neutro Abs: 4662 cells/uL (ref 1500–7800)
Neutrophils Relative %: 63 %
Platelets: 310 10*3/uL (ref 140–400)
RBC: 3.21 MIL/uL — AB (ref 3.80–5.10)
RDW: 19.2 % — AB (ref 11.0–15.0)
WBC: 7.4 10*3/uL (ref 3.8–10.8)

## 2015-07-13 NOTE — Assessment & Plan Note (Signed)
She does have a little release symptoms, therefore allow her blood pressure stays stable until we see her cardiac catheterization. She has some carotid disease which would leave her susceptible to orthostatic symptoms.

## 2015-07-13 NOTE — Assessment & Plan Note (Signed)
Blood pressure is elevated today. I think she is under a lot of stress. On my check it was little bit lower. Would likely increase amlodipine to 10 mg if her pressures are still elevated in the Cath Lab.

## 2015-07-13 NOTE — Assessment & Plan Note (Signed)
History of DES to the RCA. Now with recurrent anginal symptoms that is progressive in nature at least class IV current.  She had a Myoview in the past that was negative, now symptomatic. On high-dose Toprol, Imdur, statin, calcium channel blocker.  With persistent symptoms, will plan is to schedule urgent cardiac catheterization with possible PCI.

## 2015-07-13 NOTE — Assessment & Plan Note (Signed)
Now having anginal type chest pains with minimal exertion simply walking across the room. She has known coronary disease status post PCI - I'm concerned with the progressive nature of her symptoms No improvement with increased dose of Imdur. She is on nitrate on beta blocker and calcium channel blocker.  Plan: Schedule left heart catheterization with coronary angiography and possible PCI.

## 2015-07-13 NOTE — Patient Instructions (Addendum)
Medication Instructions:  Your physician recommends that you continue on your current medications as directed. Please refer to the Current Medication list given to you today.  Labwork: Pt/inr/bmet/cbc/ptt AT [facility] ON FIRST FLOOR  Testing/Procedures: Your physician has requested that you have a cardiac catheterization. Cardiac catheterization is used to diagnose and/or treat various heart conditions. Doctors may recommend this procedure for a number of different reasons. The most common reason is to evaluate chest pain. Chest pain can be a symptom of coronary artery disease (CAD), and cardiac catheterization can show whether plaque is narrowing or blocking your heart's arteries. This procedure is also used to evaluate the valves, as well as measure the blood flow and oxygen levels in different parts of your heart. For further information please visit [email or web address]. Please follow instruction sheet, as given. WITH DR Ellyn Hack  Follow-Up: Your physician recommends that you schedule a follow-up appointment in: [place] physician wants you to follow-up in: [place] will receive a reminder letter in the mail two months in advance. If you don't receive a letter, please call our office to schedule the follow-up appointment.  If you need a refill on your cardiac medications before your next appointment, please call your pharmacy.

## 2015-07-13 NOTE — Progress Notes (Signed)
PCP: Antony Blackbird, MD  Clinic Note: Chief Complaint  Patient presents with  . Follow-up  . Shortness of Breath    LIGHTHEADED/SOB  . Edema    EDEMA IN RIGHT LEG/PAIN AND CRAMPING IN LEGS  . Chest Pain    PRESSURE    HPI: JOSSETTE RISSO is a 70 y.o. female with a PMH below who presents today for ~2 yr f/u for CAD-PCI. She has CAD s/p NSTEMI in 2009 - PCI to RCA as well as PAD s/p L CIA-EIA stent in 05/2013.  Now back on Plavix following GI Bleed.   Jakelin D Leazer was last seen in June 2015. She quit smoking last year. Has successfully made it through multiple year.  Recent Hospitalizations: none  Studies Reviewed: LEA dopplers - followed by Dr. Gwenlyn Found (just seen on 4/7)  Interval History: Darlene presents today for much delayed follow-up. She actually just saw Dr. Gwenlyn Found, and really has not had much lately claudication symptoms, however the main concern she has a day is an over the last couple months she's been having progressively worsening episodes of exertional chest discomfort radiating to her jaw. These episodes last maybe 2-4 minutes and are relieved with rest. They're now happening with simply walking from the waiting area back to the clinic room. Is associated with dyspnea. She describes it as a 6 pressure and squeezing type sensation along with a sharp component. It does go to her jaw and left arm a little bit. She doesn't have any sensation of nausea, but she does have a sensation of rapid heartbeat or hard heartbeat when this occurs. It usually resolves spontaneously, she has not taken nitroglycerin. Caryl Comes which he saw Dr. Gwenlyn Found last week, he increased her Imdur from 30 mg to 60 mg, and she is still not noted any benefit. She does state that when her heart feels like is beating really hard and fast that she feels a little bit dizzy and lightheaded, but has not actually had a syncopal episode.  She really has not been doing any exercise over the last several weeks because of  this worsening symptom. He has not had any symptoms at rest.  He denies any heart failure symptoms of PND, orthopnea or edema. She has had some near syncope type symptoms, but no syncope or TIA/amaurosis fugax symptoms. No melena, hematochezia, hematuria, or epstaxis. Not able to assess claudication because of her limited activity level. Had not had claudication 3 months ago.  ROS: A comprehensive was performed. Review of Systems  Constitutional: Negative for fever, chills and malaise/fatigue.  HENT: Negative for congestion and nosebleeds.   Respiratory: Positive for shortness of breath (On exertion). Negative for cough and wheezing.   Cardiovascular: Positive for chest pain and palpitations. Negative for claudication and leg swelling.  Gastrointestinal: Negative for blood in stool and melena.  Genitourinary: Negative for hematuria.  Musculoskeletal: Negative for myalgias, joint pain (Right knee has been swelling and stiff, painful) and falls.  Neurological: Positive for dizziness (Some positional dizziness). Negative for headaches.  Endo/Heme/Allergies: Does not bruise/bleed easily.    Past Medical History  Diagnosis Date  . Essential hypertension     a. Normal renal arteries by PV angio 05/2013.  Marland Kitchen Dyslipidemia, goal LDL below 70   . Internal hemorrhoids  04/11/2010    By colonoscopy  . Diverticulosis   04/11/2010     Colonoscopy  . Insomnia   . Asthma  10/31/2007  . Chronic back pain   . Iron deficiency anemia   .  GERD (gastroesophageal reflux disease)   . Peripheral arterial disease ([place])     a. s/p PTA and stenting of L CIA stenosis 05/2013.  Marland Kitchen CAD (coronary artery disease) 08/05/2007    a) NSTEMI 2009: PCI to the RCA Promus DES 2.5 mm x 23 mm (3.0 mm). b) Myoview 05/2012: inferolateral ST changes that were more pronounced w/exercise, Imaging w/o Infarct or Ischemia, EF = 81%.   . CKD (chronic kidney disease), stage II   . Tobacco abuse     Quit in 05/2013  . Carotid artery  disease ([place])     a. 50-69% RICA by outpt dopp 04/22/13, f/u due 04/2014.    Past Surgical History  Procedure Laterality Date  . Colonoscopy  04/11/2010  . Iliac artery stent Left 05/04/2013    L CIA-EIA -- Dr. Gwenlyn Found  . Tonsillectomy  1960's  . Cholecystectomy  1975  . Coronary angioplasty with stent placement  08/05/2007    Promus DES 2.5 mm x 23 mm (3 mm)  to RCA; EF 60-70%  . Esophagogastroduodenoscopy N/A 09/13/2013    Procedure: ESOPHAGOGASTRODUODENOSCOPY (EGD);  Surgeon: Lear Ng, MD;  Location: [facility] ENDOSCOPY;  Service: Endoscopy;  Laterality: N/A;  . Lower extremity angiogram Bilateral 05/04/2013    Procedure: LOWER EXTREMITY ANGIOGRAM;  Surgeon: Lorretta Harp, MD;  Location: [facility] CATH LAB;  Service: Cardiovascular;  Laterality: Bilateral;    Prior to Admission medications   Medication Sig Start Date End Date Taking? Authorizing Provider  albuterol (PROVENTIL HFA;VENTOLIN HFA) 108 (90 BASE) MCG/ACT inhaler Inhale 2 puffs into the lungs every 4 (four) hours as needed. For shortness of breath   Yes Historical Provider, MD  amLODipine (NORVASC) 5 MG tablet Take 5 mg by mouth daily.   Yes Historical Provider, MD  atorvastatin (LIPITOR) 40 MG tablet Take 40 mg by mouth daily.   Yes Historical Provider, MD  ferrous sulfate 325 (65 FE) MG EC tablet Take 325 mg by mouth daily with breakfast.   Yes Historical Provider, MD  Fluticasone-Salmeterol (ADVAIR) 100-50 MCG/DOSE AEPB Inhale 1 puff into the lungs 2 (two) times daily as needed (shortness of breath).   Yes Historical Provider, MD  hydrochlorothiazide (HYDRODIURIL) 25 MG tablet Take 25 mg by mouth daily. 08/17/13  Yes Historical Provider, MD  isosorbide mononitrate (IMDUR) 60 MG 24 hr tablet Take 1 tablet (60 mg total) by mouth daily. 07/08/15  Yes Lorretta Harp, MD  loratadine (CLARITIN) 10 MG tablet Take 10 mg by mouth daily as needed for allergies.   Yes Historical Provider, MD  metoprolol succinate (TOPROL-XL) 100 MG 24 hr  tablet Take 150 mg by mouth daily. Take with or immediately following a meal.   Yes Historical Provider, MD  nitroGLYCERIN (NITROSTAT) 0.4 MG SL tablet Place 1 tablet (0.4 mg total) under the tongue every 5 (five) minutes as needed for chest pain. 12/22/12  Yes Leonie Man, MD  pantoprazole (PROTONIX) 40 MG tablet Take 1 tablet (40 mg total) by mouth daily. 09/14/13  Yes Shanker Kristeen Mans, MD    No Known Allergies   Social History   Social History  . Marital Status: Married    Spouse Name: N/A  . Number of Children: 1  . Years of Education: N/A   Occupational History  .     Social History Main Topics  . Smoking status: Former Smoker    Types: Cigarettes    Quit date: 05/04/2013  . Smokeless tobacco: Never Used  . Alcohol Use: Yes  Comment: occasionally 3 times a year  . Drug Use: No  . Sexual Activity: No   Other Topics Concern  . None   Social History Narrative   Married. Has one child. Grandmother for a great-grandmother and 1.   No real exercise.   Denies alcohol consumption.   Quit smoking inf Feb 2015 -- after L Iliac Stent   family history includes Breast cancer (age of onset: 80) in her sister; Breast cancer (age of onset: 37) in her mother; Colon cancer (age of onset: 48) in her mother; Diabetes in her mother and sister; Heart attack (age of onset: 59) in her father; Heart attack (age of onset: 27) in her mother; Heart disease in her mother.   Wt Readings from Last 3 Encounters:  07/13/15 140 lb 9.6 oz (63.776 kg)  07/08/15 142 lb (64.411 kg)  09/11/13 151 lb 3.2 oz (68.584 kg)    PHYSICAL EXAM BP 162/72 mmHg  Pulse 85  Ht 4\' 11"  (1.499 m)  Wt 140 lb 9.6 oz (63.776 kg)  BMI 28.38 kg/m2 General appearance: alert, cooperative, appears stated age, no distress and Borderline obese HEENT: [place]/AT, EOMI, MMM, anicteric sclera Neck: no adenopathy, R >L carotid bruit and no JVD Lungs: clear to auscultation bilaterally, normal percussion bilaterally and  non-labored Heart: regular rate and rhythm, S1, S2 normal, no murmur, click, rub or gallop; nondisplaced PMI Abdomen: soft, non-tender; bowel sounds normal; no masses,  no organomegaly;  Extremities: extremities normal, atraumatic, no cyanosis, and edema is minimal Pulses: 2+ and symmetric; Skin: mobility and turgor normal  Neurologic: Mental status: Alert, oriented, thought content appropriate Cranial nerves: normal (II-XII grossly intact)    Adult ECG Report  Rate: 71 ;  Rhythm: normal sinus rhythm and Normal axis, intervals and durations. Borderline LVH voltage. Nonspecific ST-T wave changes. Otherwise stable;   Narrative Interpretation: Stable EKG   Other studies Reviewed: Additional studies/ records that were reviewed today include:  Recent Labs:  None available  ASSESSMENT / PLAN: Problem List Items Addressed This Visit    Orthostatic dizziness (Chronic)    She does have a little release symptoms, therefore allow her blood pressure stays stable until we see her cardiac catheterization. She has some carotid disease which would leave her susceptible to orthostatic symptoms.      Essential hypertension, benign (Chronic)    Blood pressure is elevated today. I think she is under a lot of stress. On my check it was little bit lower. Would likely increase amlodipine to 10 mg if her pressures are still elevated in the Cath Lab.      Relevant Medications   aspirin 81 MG tablet   Dyslipidemia, goal LDL below 70 (Chronic)    On statin. Followed by PCP. She says last check was good.      Relevant Medications   aspirin 81 MG tablet   CAD S/P percutaneous coronary angioplasty -- Promus DES 2.5 mm x 20 mm postdilated to 3 mm (Chronic)    History of DES to the RCA. Now with recurrent anginal symptoms that is progressive in nature at least class IV current.  She had a Myoview in the past that was negative, now symptomatic. On high-dose Toprol, Imdur, statin, calcium channel  blocker.  With persistent symptoms, will plan is to schedule urgent cardiac catheterization with possible PCI.      Relevant Medications   aspirin 81 MG tablet   Bilateral carotid artery disease (HCC)   Relevant Medications   aspirin 81 MG  tablet   Angina, class IV ([place]) - Primary    Now having anginal type chest pains with minimal exertion simply walking across the room. She has known coronary disease status post PCI - I'm concerned with the progressive nature of her symptoms No improvement with increased dose of Imdur. She is on nitrate on beta blocker and calcium channel blocker.  Plan: Schedule left heart catheterization with coronary angiography and possible PCI.      Relevant Medications   aspirin 81 MG tablet   Other Relevant Orders   EKG 12-Lead (Completed)   PTT   INR/PT   CBC with Differential/Platelet   Basic metabolic panel      Current medicines are reviewed at length with the patient today. (+/- concerns) n/a The following changes have been made: none at present. Use PRN NTG if able  Studies Ordered:   Orders Placed This Encounter  Procedures  . PTT  . INR/PT  . CBC with Differential/Platelet  . Basic metabolic panel  . EKG 12-Lead   Follow-Up: Your physician recommends that you schedule a follow-up appointment in: [place] physician wants you to follow-up in: 3 MONTH OV    Joren Rehm, Leonie Green, M.D., M.S. Interventional Cardiologist   Pager # [phone] Phone # [phone] [address] [place], [place] [address]

## 2015-07-13 NOTE — Assessment & Plan Note (Signed)
On statin. Followed by PCP. She says last check was good.

## 2015-07-14 ENCOUNTER — Observation Stay (HOSPITAL_COMMUNITY)
Admission: EM | Admit: 2015-07-14 | Discharge: 2015-07-15 | Disposition: A | Discharge status: Home / Self Care | Payer: Medicare Other | Attending: Internal Medicine | Admitting: Internal Medicine

## 2015-07-14 ENCOUNTER — Other Ambulatory Visit: Payer: Self-pay

## 2015-07-14 ENCOUNTER — Telehealth: Payer: Self-pay | Admitting: Cardiology

## 2015-07-14 ENCOUNTER — Emergency Department (HOSPITAL_COMMUNITY): Payer: Medicare Other

## 2015-07-14 ENCOUNTER — Encounter (HOSPITAL_COMMUNITY): Payer: Self-pay | Admitting: *Deleted

## 2015-07-14 ENCOUNTER — Encounter (HOSPITAL_COMMUNITY)
Admission: EM | Disposition: A | Discharge status: Home / Self Care | Payer: Self-pay | Source: Home / Self Care | Attending: Emergency Medicine

## 2015-07-14 DIAGNOSIS — N183 Chronic kidney disease, stage 3 (moderate): Secondary | ICD-10-CM | POA: Diagnosis not present

## 2015-07-14 DIAGNOSIS — Z7902 Long term (current) use of antithrombotics/antiplatelets: Secondary | ICD-10-CM | POA: Insufficient documentation

## 2015-07-14 DIAGNOSIS — K922 Gastrointestinal hemorrhage, unspecified: Secondary | ICD-10-CM | POA: Diagnosis present

## 2015-07-14 DIAGNOSIS — I998 Other disorder of circulatory system: Secondary | ICD-10-CM | POA: Insufficient documentation

## 2015-07-14 DIAGNOSIS — Z7982 Long term (current) use of aspirin: Secondary | ICD-10-CM | POA: Diagnosis not present

## 2015-07-14 DIAGNOSIS — J45909 Unspecified asthma, uncomplicated: Secondary | ICD-10-CM | POA: Diagnosis not present

## 2015-07-14 DIAGNOSIS — Z87891 Personal history of nicotine dependence: Secondary | ICD-10-CM | POA: Diagnosis not present

## 2015-07-14 DIAGNOSIS — I2510 Atherosclerotic heart disease of native coronary artery without angina pectoris: Secondary | ICD-10-CM | POA: Diagnosis not present

## 2015-07-14 DIAGNOSIS — K449 Diaphragmatic hernia without obstruction or gangrene: Secondary | ICD-10-CM | POA: Diagnosis not present

## 2015-07-14 DIAGNOSIS — D649 Anemia, unspecified: Secondary | ICD-10-CM

## 2015-07-14 DIAGNOSIS — I10 Essential (primary) hypertension: Secondary | ICD-10-CM | POA: Diagnosis not present

## 2015-07-14 DIAGNOSIS — D500 Iron deficiency anemia secondary to blood loss (chronic): Principal | ICD-10-CM | POA: Insufficient documentation

## 2015-07-14 DIAGNOSIS — K31811 Angiodysplasia of stomach and duodenum with bleeding: Secondary | ICD-10-CM | POA: Insufficient documentation

## 2015-07-14 DIAGNOSIS — D509 Iron deficiency anemia, unspecified: Secondary | ICD-10-CM | POA: Diagnosis present

## 2015-07-14 DIAGNOSIS — Z955 Presence of coronary angioplasty implant and graft: Secondary | ICD-10-CM | POA: Diagnosis not present

## 2015-07-14 DIAGNOSIS — I129 Hypertensive chronic kidney disease with stage 1 through stage 4 chronic kidney disease, or unspecified chronic kidney disease: Secondary | ICD-10-CM | POA: Diagnosis not present

## 2015-07-14 DIAGNOSIS — E785 Hyperlipidemia, unspecified: Secondary | ICD-10-CM | POA: Diagnosis not present

## 2015-07-14 DIAGNOSIS — Z79899 Other long term (current) drug therapy: Secondary | ICD-10-CM | POA: Insufficient documentation

## 2015-07-14 DIAGNOSIS — Z9861 Coronary angioplasty status: Secondary | ICD-10-CM

## 2015-07-14 HISTORY — PX: ESOPHAGOGASTRODUODENOSCOPY: SHX5428

## 2015-07-14 HISTORY — DX: Disorder of kidney and ureter, unspecified: N28.9

## 2015-07-14 LAB — IRON AND TIBC
IRON: 13 ug/dL — AB (ref 28–170)
Saturation Ratios: 3 % — ABNORMAL LOW (ref 10.4–31.8)
TIBC: 514 ug/dL — ABNORMAL HIGH (ref 250–450)
UIBC: 501 ug/dL

## 2015-07-14 LAB — CBC
HCT: 22.2 % — ABNORMAL LOW (ref 36.0–46.0)
HEMATOCRIT: 27.6 % — AB (ref 36.0–46.0)
HEMOGLOBIN: 8.1 g/dL — AB (ref 12.0–15.0)
Hemoglobin: 6.2 g/dL — CL (ref 12.0–15.0)
MCH: 19.6 pg — ABNORMAL LOW (ref 26.0–34.0)
MCH: 22 pg — AB (ref 26.0–34.0)
MCHC: 27.9 g/dL — ABNORMAL LOW (ref 30.0–36.0)
MCHC: 29.3 g/dL — AB (ref 30.0–36.0)
MCV: 70 fL — AB (ref 78.0–100.0)
MCV: 75 fL — AB (ref 78.0–100.0)
PLATELETS: 292 10*3/uL (ref 150–400)
Platelets: 235 10*3/uL (ref 150–400)
RBC: 3.17 MIL/uL — AB (ref 3.87–5.11)
RBC: 3.68 MIL/uL — AB (ref 3.87–5.11)
RDW: 17.7 % — ABNORMAL HIGH (ref 11.5–15.5)
RDW: 18.1 % — ABNORMAL HIGH (ref 11.5–15.5)
WBC: 7.8 10*3/uL (ref 4.0–10.5)
WBC: 7.1 10*3/uL (ref 4.0–10.5)

## 2015-07-14 LAB — RETICULOCYTES
RBC.: 3.2 MIL/uL — AB (ref 3.87–5.11)
RETIC COUNT ABSOLUTE: 86.4 10*3/uL (ref 19.0–186.0)
Retic Ct Pct: 2.7 % (ref 0.4–3.1)

## 2015-07-14 LAB — PROTIME-INR
INR: 1.05 (ref <1.50)
INR: 1.1 (ref 0.00–1.49)
Prothrombin Time: 13.8 seconds (ref 11.6–15.2)
Prothrombin Time: 14.4 seconds (ref 11.6–15.2)

## 2015-07-14 LAB — FERRITIN: Ferritin: 3 ng/mL — ABNORMAL LOW (ref 11–307)

## 2015-07-14 LAB — COMPREHENSIVE METABOLIC PANEL
ALT: 11 U/L — AB (ref 14–54)
AST: 20 U/L (ref 15–41)
Albumin: 3.5 g/dL (ref 3.5–5.0)
Alkaline Phosphatase: 72 U/L (ref 38–126)
Anion gap: 12 (ref 5–15)
BUN: 17 mg/dL (ref 6–20)
CALCIUM: 9.1 mg/dL (ref 8.9–10.3)
CO2: 21 mmol/L — ABNORMAL LOW (ref 22–32)
CREATININE: 1.31 mg/dL — AB (ref 0.44–1.00)
Chloride: 107 mmol/L (ref 101–111)
GFR, EST AFRICAN AMERICAN: 47 mL/min — AB (ref >60)
GFR, EST NON AFRICAN AMERICAN: 41 mL/min — AB (ref >60)
Glucose, Bld: 102 mg/dL — ABNORMAL HIGH (ref 65–99)
Potassium: 4.5 mmol/L (ref 3.5–5.1)
Sodium: 140 mmol/L (ref 135–145)
Total Bilirubin: 0.5 mg/dL (ref 0.3–1.2)
Total Protein: 7.8 g/dL (ref 6.5–8.1)

## 2015-07-14 LAB — VITAMIN B12: Vitamin B-12: 258 pg/mL (ref 180–914)

## 2015-07-14 LAB — POC OCCULT BLOOD, ED: Fecal Occult Bld: POSITIVE — AB

## 2015-07-14 LAB — TROPONIN I
Troponin I: <0.03 ng/mL (ref <0.031)
Troponin I: <0.03 ng/mL (ref <0.031)

## 2015-07-14 LAB — PREPARE RBC (CROSSMATCH)

## 2015-07-14 LAB — FOLATE: Folate: 9.7 ng/mL (ref >5.9)

## 2015-07-14 LAB — APTT: APTT: 27 s (ref 24–37)

## 2015-07-14 SURGERY — EGD (ESOPHAGOGASTRODUODENOSCOPY)
Anesthesia: Moderate Sedation

## 2015-07-14 MED ORDER — IOPAMIDOL (ISOVUE-300) INJECTION 61%
INTRAVENOUS | Status: AC
  Administered 2015-07-15: 100 mL
  Filled 2015-07-14: qty 100

## 2015-07-14 MED ORDER — FENTANYL CITRATE (PF) 100 MCG/2ML IJ SOLN
INTRAMUSCULAR | Status: DC | PRN
  Administered 2015-07-14: 25 ug via INTRAVENOUS
  Administered 2015-07-14: 12.5 ug via INTRAVENOUS
  Administered 2015-07-14 (×2): 25 ug via INTRAVENOUS

## 2015-07-14 MED ORDER — ALBUTEROL SULFATE (2.5 MG/3ML) 0.083% IN NEBU: 2.5000 mg | INHALATION_SOLUTION | RESPIRATORY_TRACT | Status: DC | PRN

## 2015-07-14 MED ORDER — ONDANSETRON HCL 4 MG/2ML IJ SOLN
4.0000 mg | Freq: Four times a day (QID) | INTRAMUSCULAR | Status: DC | PRN
  Administered 2015-07-14: 4 mg via INTRAVENOUS
  Filled 2015-07-14: qty 2

## 2015-07-14 MED ORDER — DIPHENHYDRAMINE HCL 50 MG/ML IJ SOLN
INTRAMUSCULAR | Status: AC
  Filled 2015-07-14: qty 1

## 2015-07-14 MED ORDER — PANTOPRAZOLE SODIUM 40 MG IV SOLR
40.0000 mg | Freq: Once | INTRAVENOUS | Status: AC
  Administered 2015-07-14: 40 mg via INTRAVENOUS
  Filled 2015-07-14: qty 40

## 2015-07-14 MED ORDER — AMLODIPINE BESYLATE 5 MG PO TABS
5.0000 mg | ORAL_TABLET | Freq: Every day | ORAL | Status: DC
  Administered 2015-07-14 – 2015-07-15 (×2): 5 mg via ORAL
  Filled 2015-07-14 (×2): qty 1

## 2015-07-14 MED ORDER — MIDAZOLAM HCL 10 MG/2ML IJ SOLN
INTRAMUSCULAR | Status: DC | PRN
  Administered 2015-07-14: 1 mg via INTRAVENOUS
  Administered 2015-07-14 (×2): 2 mg via INTRAVENOUS
  Administered 2015-07-14: 1 mg via INTRAVENOUS

## 2015-07-14 MED ORDER — SODIUM CHLORIDE 0.9 % IV SOLN
Freq: Once | INTRAVENOUS | Status: AC
  Administered 2015-07-14: 12:00:00 via INTRAVENOUS

## 2015-07-14 MED ORDER — FENTANYL CITRATE (PF) 100 MCG/2ML IJ SOLN
INTRAMUSCULAR | Status: AC
  Filled 2015-07-14: qty 2

## 2015-07-14 MED ORDER — METOPROLOL SUCCINATE ER 50 MG PO TB24
150.0000 mg | ORAL_TABLET | Freq: Every day | ORAL | Status: DC
  Administered 2015-07-14 – 2015-07-15 (×2): 150 mg via ORAL
  Filled 2015-07-14 (×2): qty 1

## 2015-07-14 MED ORDER — DIATRIZOATE MEGLUMINE & SODIUM 66-10 % PO SOLN
ORAL | Status: AC
  Administered 2015-07-14: 21:00:00
  Filled 2015-07-14: qty 30

## 2015-07-14 MED ORDER — BOOST / RESOURCE BREEZE PO LIQD
1.0000 | Freq: Three times a day (TID) | ORAL | Status: DC
  Administered 2015-07-14 – 2015-07-15 (×3): 1 via ORAL
  Filled 2015-07-14: qty 1

## 2015-07-14 MED ORDER — ATORVASTATIN CALCIUM 40 MG PO TABS
40.0000 mg | ORAL_TABLET | Freq: Every day | ORAL | Status: DC
  Administered 2015-07-14: 40 mg via ORAL
  Filled 2015-07-14: qty 1

## 2015-07-14 MED ORDER — SODIUM CHLORIDE 0.9 % IV SOLN: INTRAVENOUS | Status: DC

## 2015-07-14 MED ORDER — ISOSORBIDE MONONITRATE ER 60 MG PO TB24
60.0000 mg | ORAL_TABLET | Freq: Every day | ORAL | Status: DC
  Administered 2015-07-14 – 2015-07-15 (×2): 60 mg via ORAL
  Filled 2015-07-14 (×2): qty 1

## 2015-07-14 MED ORDER — PANTOPRAZOLE SODIUM 40 MG IV SOLR
40.0000 mg | Freq: Two times a day (BID) | INTRAVENOUS | Status: DC
  Administered 2015-07-14 – 2015-07-15 (×2): 40 mg via INTRAVENOUS
  Filled 2015-07-14 (×2): qty 40

## 2015-07-14 MED ORDER — BUTAMBEN-TETRACAINE-BENZOCAINE 2-2-14 % EX AERO
INHALATION_SPRAY | CUTANEOUS | Status: DC | PRN
  Administered 2015-07-14: 2 via TOPICAL

## 2015-07-14 MED ORDER — ALBUTEROL SULFATE HFA 108 (90 BASE) MCG/ACT IN AERS: 2.0000 | INHALATION_SPRAY | RESPIRATORY_TRACT | Status: DC | PRN

## 2015-07-14 MED ORDER — MOMETASONE FURO-FORMOTEROL FUM 100-5 MCG/ACT IN AERO
2.0000 | INHALATION_SPRAY | Freq: Two times a day (BID) | RESPIRATORY_TRACT | Status: DC
  Administered 2015-07-15: 2 via RESPIRATORY_TRACT
  Filled 2015-07-14: qty 8.8

## 2015-07-14 MED ORDER — MIDAZOLAM HCL 5 MG/ML IJ SOLN
INTRAMUSCULAR | Status: AC
  Filled 2015-07-14: qty 2

## 2015-07-14 MED ORDER — LORATADINE 10 MG PO TABS: 10.0000 mg | ORAL_TABLET | Freq: Every day | ORAL | Status: DC | PRN

## 2015-07-14 NOTE — H&P (Signed)
[facility] History and Physical  Kayla Mcclure [id number] DOB: 08/19/1945 DOA: 07/14/2015  Referring physician: Emergency Department PCP: Antony Blackbird, MD   CHIEF COMPLAINT:  anemia                 HPI: Kayla Mcclure is a 70 y.o. female with a PMH not limited to asthma ,CAD / stenting 2009, CKD, HTN, carotid artery disease, and PAD s/p revascularization of left common and external iliac artery February 2015. Patient seen by Cardiologist  Yesterday for progressive exertional chest discomfort. Cardiac cath was scheduled but then labs returned showing hgb of 6.2.. She is on plavix and asa. No blood in stools / black stools. Cardiology sent her to ED for evaluation. In 2015 patient had APC of a gastric AVM (EGD for heme positive stool). No abdominal pain or nausea. Patient was involuntarily lost about 30 pounds over last 6 months. States she is up to date on colonoscopy.   Patient complains of heavy feeling in RLE when she walks. Scheduled for doppler study of RLE and carotids later this month.   ED workup:   brown, heme positive stool.  hgb 6.2, MCV 70 Trop <0.03 Ferritin 3 TIBC 514  EKG:    Sinus rhythm Atrial premature complex Borderline right axis deviation Baseline wander in lead(s) V2 V3 V4 V5 V6 Partial missing lead(s): V2 V3 V4 V5 V6                    Medications  pantoprazole (PROTONIX) injection 40 mg (40 mg Intravenous Given 07/14/15 1104)  0.9 %  sodium chloride infusion ( Intravenous New Bag/Given 07/14/15 1150)    Review of Systems  Constitutional: Positive for weight loss.  HENT: Negative.   Eyes: Negative.   Respiratory: Negative.   Cardiovascular: Positive for chest pain and claudication.  Gastrointestinal: Negative.   Genitourinary: Negative.   Musculoskeletal: Negative.   Skin: Negative.   Neurological: Negative.   Endo/Heme/Allergies: Negative.   Psychiatric/Behavioral: Negative.     Past Medical History  Diagnosis Date  . Essential  hypertension     a. Normal renal arteries by PV angio 05/2013.  Marland Kitchen Dyslipidemia, goal LDL below 70   . Internal hemorrhoids  04/11/2010    By colonoscopy  . Diverticulosis   04/11/2010     Colonoscopy  . Insomnia   . Asthma  10/31/2007  . Chronic back pain   . Iron deficiency anemia   . GERD (gastroesophageal reflux disease)   . Peripheral arterial disease ([place])     a. s/p PTA and stenting of L CIA stenosis 05/2013.  Marland Kitchen CAD (coronary artery disease) 08/05/2007    a) NSTEMI 2009: PCI to the RCA Promus DES 2.5 mm x 23 mm (3.0 mm). b) Myoview 05/2012: inferolateral ST changes that were more pronounced w/exercise, Imaging w/o Infarct or Ischemia, EF = 81%.   . CKD (chronic kidney disease), stage II   . Tobacco abuse     Quit in 05/2013  . Carotid artery disease ([place])     a. 50-69% RICA by outpt dopp 04/22/13, f/u due 04/2014.   Past Surgical History  Procedure Laterality Date  . Colonoscopy  04/11/2010  . Iliac artery stent Left 05/04/2013    L CIA-EIA -- Dr. Gwenlyn Found  . Tonsillectomy  1960's  . Cholecystectomy  1975  . Coronary angioplasty with stent placement  08/05/2007    Promus DES 2.5 mm x 23 mm (3 mm)  to RCA; EF 60-70%  .  Esophagogastroduodenoscopy N/A 09/13/2013    Procedure: ESOPHAGOGASTRODUODENOSCOPY (EGD);  Surgeon: Lear Ng, MD;  Location: [facility] ENDOSCOPY;  Service: Endoscopy;  Laterality: N/A;  . Lower extremity angiogram Bilateral 05/04/2013    Procedure: LOWER EXTREMITY ANGIOGRAM;  Surgeon: Lorretta Harp, MD;  Location: [facility] CATH LAB;  Service: Cardiovascular;  Laterality: Bilateral;    SOCIAL HISTORY:  reports that she quit smoking about 2 years ago. Her smoking use included Cigarettes. She has never used smokeless tobacco. She reports that she drinks alcohol. She reports that she does not use illicit drugs. Lives:   With husband      Assistive devices:   None needed for ambulation.   No Known Allergies  Family History  Problem Relation Age of Onset  . Diabetes  Mother   . Heart disease Mother   . Heart attack Mother 85  . Breast cancer Mother 25  . Colon cancer Mother 52  . Diabetes Sister   . Heart attack Father 63  . Breast cancer Sister 39    Prior to Admission medications   Medication Sig Start Date End Date Taking? Authorizing Provider  albuterol (PROVENTIL HFA;VENTOLIN HFA) 108 (90 BASE) MCG/ACT inhaler Inhale 2 puffs into the lungs every 4 (four) hours as needed. For shortness of breath   Yes Historical Provider, MD  amLODipine (NORVASC) 5 MG tablet Take 5 mg by mouth daily.   Yes Historical Provider, MD  aspirin 81 MG tablet Take 81 mg by mouth daily.   Yes Historical Provider, MD  atorvastatin (LIPITOR) 40 MG tablet Take 40 mg by mouth daily.   Yes Historical Provider, MD  clopidogrel (PLAVIX) 75 MG tablet Take 75 mg by mouth daily.   Yes Historical Provider, MD  ferrous sulfate 325 (65 FE) MG EC tablet Take 325 mg by mouth daily with breakfast.   Yes Historical Provider, MD  Fluticasone-Salmeterol (ADVAIR) 100-50 MCG/DOSE AEPB Inhale 1 puff into the lungs 2 (two) times daily as needed (shortness of breath).   Yes Historical Provider, MD  hydrochlorothiazide (HYDRODIURIL) 25 MG tablet Take 25 mg by mouth daily. 08/17/13  Yes Historical Provider, MD  isosorbide mononitrate (IMDUR) 60 MG 24 hr tablet Take 1 tablet (60 mg total) by mouth daily. 07/08/15  Yes Lorretta Harp, MD  loratadine (CLARITIN) 10 MG tablet Take 10 mg by mouth daily as needed for allergies.   Yes Historical Provider, MD  metoprolol succinate (TOPROL-XL) 100 MG 24 hr tablet Take 150 mg by mouth daily. Take with or immediately following a meal.   Yes Historical Provider, MD  nitroGLYCERIN (NITROSTAT) 0.4 MG SL tablet Place 1 tablet (0.4 mg total) under the tongue every 5 (five) minutes as needed for chest pain. 12/22/12  Yes Leonie Man, MD  pantoprazole (PROTONIX) 40 MG tablet Take 1 tablet (40 mg total) by mouth daily. 09/14/13  Yes Shanker Kristeen Mans, MD   PHYSICAL  EXAM: Filed Vitals:   07/14/15 1145 07/14/15 1150 07/14/15 1209 07/14/15 1215  BP: 118/57 118/57 140/63 140/63  Pulse: 75 81 81 84  Temp:  99 F (37.2 C) 99 F (37.2 C)   TempSrc:  Oral Oral   Resp: 13 17 17 19   Height:      Weight:      SpO2: 100% 100% 100% 100%    Wt Readings from Last 3 Encounters:  07/14/15 63.504 kg (140 lb)  07/13/15 63.776 kg (140 lb 9.6 oz)  07/08/15 64.411 kg (142 lb)    General:  Pleasant  black  female. Appears calm and comfortable Eyes: PER, normal lids, irises & conjunctiva ENT: grossly normal hearing, lips & tongue Neck: no LAD, no masses. Right carotid bruit. Cardiovascular: RRR, no murmurs. No LE edema.  Respiratory: Respirations even and unlabored. Normal respiratory effort. Lungs CTA bilaterally, no wheezes / rales .   Abdomen: soft, non-distended, non-tender, active bowel sounds. No obvious masses.  Skin: no rash seen on limited exam Musculoskeletal: grossly normal tone BUE/BLE Psychiatric: grossly normal mood and affect, speech fluent and appropriate Neurologic: grossly non-focal.         LABS ON ADMISSION:    Basic Metabolic Panel:  Recent Labs Lab 07/14/15 0944  NA 140  K 4.5  CL 107  CO2 21*  GLUCOSE 102*  BUN 17  CREATININE 1.31*  CALCIUM 9.1   Liver Function Tests:  Recent Labs Lab 07/14/15 0944  AST 20  ALT 11*  ALKPHOS 72  BILITOT 0.5  PROT 7.8  ALBUMIN 3.5    CBC:  Recent Labs Lab 07/13/15 1135 07/14/15 0944  WBC 7.4 7.8  NEUTROABS 4662  --   HGB 6.4* 6.2*  HCT 22.2* 22.2*  MCV 69.2* 70.0*  PLT 310 292   CREATININE: 1.31 mg/dL ABNORMAL (07/14/15 0944) Estimated creatinine clearance - 33.3 mL/min  Radiological Exams on Admission: Dg Chest 2 View  07/14/2015  CLINICAL DATA:  Shortness of breath, dizziness EXAM: CHEST  2 VIEW COMPARISON:  06/02/2012 FINDINGS: Cardiomediastinal silhouette is stable. No acute infiltrate or pleural effusion. No pulmonary edema. Stable degenerative changes mid and  lower thoracic spine. IMPRESSION: No active cardiopulmonary disease. Electronically Signed   By: Lahoma Crocker M.D.   On: 07/14/2015 11:22    ASSESSMENT / PLAN    Severe iron deficiency, ferritin of 3. Brown hemoccult positive stools on plavix / asa. Hgb 6.2. Baseline not really known as last hgb in Epic in 2015 was 9.8 and that was following a blood transfusion for an upper GI bleed. . -admit to Observation - telemetry  -Getting PRBC now x 2 units -Holding plavix / Asa for now -EDP has consulted [facility] - patient known to them. Hx of gastric AVM 2015  -BID PPI -Keep NPO until seen by GI.  -Repeat CBC this evening following transfusion and in am. -b12, folate normal    Exertional chest pain, relieved with rest. Some radiation into jaw. Most likely secondary to profound anemia. HEART score 4. Trop < 0.03. -monitor on telemetry -cycle troponin -continue Imdur, toprol, Norvasc   Hypertension. Controlled.  -Continue anti-hypertensives as listed above.  CAD / stenting 2009.  -holding plavix and asa for now for anemia / heme + stools -continue Imdur, Toprol, Norvasc, statin.   CKD, stage 3. Cr 1.31, baseline 1.5  PAD and carotid artery disease, s/p PTA and stenting of left common iliac artery 2015.  Now with heaviness in RLE with ambulation. Patient scheduled for carotid dopplers and RLE ABIs later this month -hold plavix / asa for now given anemia / heme+ stools  Hyperlipidemia -continue home statin  Asthma.Stable -continue home inhalers   CONSULTANTS:    [facility] - Patient known to them.   Code Status: full code DVT Prophylaxis: SCDs  Family Communication:  Patient alert, oriented and understands plan of care.  Disposition Plan: Discharge to home in 24-48 hours   Time spent: 60 minutes Tye Savoy  NP [facility] Pager [phone]

## 2015-07-14 NOTE — Progress Notes (Signed)
Patient went for for a upper endoscopy today.  First unit of blood has been completed but patient went down for endo.  Will give second unit when patient returns

## 2015-07-14 NOTE — ED Notes (Signed)
Patient here with increasing weakness for the past month. Here for low hemoglobin, no pain, no shortness of breath, history of anemia

## 2015-07-14 NOTE — ED Notes (Signed)
Patient transported to X-ray 

## 2015-07-14 NOTE — Consult Note (Signed)
Reason for Consult:Guaiac positive anemia Referring Physician: hospital team  Kayla Mcclure is an 70 y.o. female.  HPI: Patient well-known to me from previous workup and her hospital computer chart and our office computer chart was reviewed and she does not have any GI complaints and has not seen any blood but has not had a hemoglobin some time but she's been having increased dyspnea on exertion for about a month and is on aspirin and Plavix. No additional nonsteroidals and her mother had colon cancer but no other GI problems run in the family and she has no other complaints  Past Medical History  Diagnosis Date  . Essential hypertension     a. Normal renal arteries by PV angio 05/2013.  Marland Kitchen Dyslipidemia, goal LDL below 70   . Internal hemorrhoids  04/11/2010    By colonoscopy  . Diverticulosis   04/11/2010     Colonoscopy  . Insomnia   . Asthma  10/31/2007  . Chronic back pain   . Iron deficiency anemia   . GERD (gastroesophageal reflux disease)   . Peripheral arterial disease ([place])     a. s/p PTA and stenting of L CIA stenosis 05/2013.  Marland Kitchen CAD (coronary artery disease) 08/05/2007    a) NSTEMI 2009: PCI to the RCA Promus DES 2.5 mm x 23 mm (3.0 mm). b) Myoview 05/2012: inferolateral ST changes that were more pronounced w/exercise, Imaging w/o Infarct or Ischemia, EF = 81%.   . CKD (chronic kidney disease), stage II   . Tobacco abuse     Quit in 05/2013  . Carotid artery disease ([place])     a. 50-69% RICA by outpt dopp 04/22/13, f/u due 04/2014.    Past Surgical History  Procedure Laterality Date  . Colonoscopy  04/11/2010  . Iliac artery stent Left 05/04/2013    L CIA-EIA -- Dr. Gwenlyn Found  . Tonsillectomy  1960's  . Cholecystectomy  1975  . Coronary angioplasty with stent placement  08/05/2007    Promus DES 2.5 mm x 23 mm (3 mm)  to RCA; EF 60-70%  . Esophagogastroduodenoscopy N/A 09/13/2013    Procedure: ESOPHAGOGASTRODUODENOSCOPY (EGD);  Surgeon: Lear Ng, MD;  Location: [facility]  ENDOSCOPY;  Service: Endoscopy;  Laterality: N/A;  . Lower extremity angiogram Bilateral 05/04/2013    Procedure: LOWER EXTREMITY ANGIOGRAM;  Surgeon: Lorretta Harp, MD;  Location: [facility] CATH LAB;  Service: Cardiovascular;  Laterality: Bilateral;    Family History  Problem Relation Age of Onset  . Diabetes Mother   . Heart disease Mother   . Heart attack Mother 74  . Breast cancer Mother 33  . Colon cancer Mother 40  . Diabetes Sister   . Heart attack Father 44  . Breast cancer Sister 60    Social History:  reports that she quit smoking about 2 years ago. Her smoking use included Cigarettes. She has never used smokeless tobacco. She reports that she drinks alcohol. She reports that she does not use illicit drugs.  Allergies: No Known Allergies  Medications: I have reviewed the patient's current medications.  Results for orders placed or performed during the hospital encounter of 07/14/15 (from the past 48 hour(s))  Comprehensive metabolic panel     Status: Abnormal   Collection Time: 07/14/15  9:44 AM  Result Value Ref Range   Sodium 140 135 - 145 mmol/L   Potassium 4.5 3.5 - 5.1 mmol/L   Chloride 107 101 - 111 mmol/L   CO2 21 (L) 22 - 32  mmol/L   Glucose, Bld 102 (H) 65 - 99 mg/dL   BUN 17 6 - 20 mg/dL   Creatinine, Ser 1.31 (H) 0.44 - 1.00 mg/dL   Calcium 9.1 8.9 - 10.3 mg/dL   Total Protein 7.8 6.5 - 8.1 g/dL   Albumin 3.5 3.5 - 5.0 g/dL   AST 20 15 - 41 U/L   ALT 11 (L) 14 - 54 U/L   Alkaline Phosphatase 72 38 - 126 U/L   Total Bilirubin 0.5 0.3 - 1.2 mg/dL   GFR calc non Af Amer 41 (L) >60 mL/min   GFR calc Af Amer 47 (L) >60 mL/min    Comment: (NOTE) The eGFR has been calculated using the CKD EPI equation. This calculation has not been validated in all clinical situations. eGFR's persistently <60 mL/min signify possible Chronic Kidney Disease.    Anion gap 12 5 - 15  CBC     Status: Abnormal   Collection Time: 07/14/15  9:44 AM  Result Value Ref Range   WBC  7.8 4.0 - 10.5 K/uL   RBC 3.17 (L) 3.87 - 5.11 MIL/uL   Hemoglobin 6.2 (LL) 12.0 - 15.0 g/dL    Comment: REPEATED TO VERIFY SPECIMEN CHECKED FOR CLOTS CRITICAL RESULT CALLED TO, READ BACK BY AND VERIFIED WITH: CCARLAN,RN AT 1032 07/14/15 BY K BARR    HCT 22.2 (L) 36.0 - 46.0 %   MCV 70.0 (L) 78.0 - 100.0 fL   MCH 19.6 (L) 26.0 - 34.0 pg   MCHC 27.9 (L) 30.0 - 36.0 g/dL   RDW 17.7 (H) 11.5 - 15.5 %   Platelets 292 150 - 400 K/uL  Protime-INR - (order if Patient is taking Coumadin / Warfarin)     Status: None   Collection Time: 07/14/15  9:44 AM  Result Value Ref Range   Prothrombin Time 14.4 11.6 - 15.2 seconds   INR 1.10 0.00 - 1.49  Type and screen [place]     Status: None (Preliminary result)   Collection Time: 07/14/15  9:46 AM  Result Value Ref Range   ABO/RH(D) A POS    Antibody Screen NEG    Sample Expiration 07/17/2015    Unit Number [id number]    Blood Component Type RED CELLS,LR    Unit division 00    Status of Unit ISSUED    Transfusion Status OK TO TRANSFUSE    Crossmatch Result Compatible    Unit Number [id number]    Blood Component Type RED CELLS,LR    Unit division 00    Status of Unit ALLOCATED    Transfusion Status OK TO TRANSFUSE    Crossmatch Result Compatible   Troponin I     Status: None   Collection Time: 07/14/15 10:22 AM  Result Value Ref Range   Troponin I <0.03 <0.031 ng/mL    Comment:        NO INDICATION OF MYOCARDIAL INJURY.   Vitamin B12     Status: None   Collection Time: 07/14/15 10:22 AM  Result Value Ref Range   Vitamin B-12 258 180 - 914 pg/mL    Comment: (NOTE) This assay is not validated for testing neonatal or myeloproliferative syndrome specimens for Vitamin B12 levels.   Folate     Status: None   Collection Time: 07/14/15 10:22 AM  Result Value Ref Range   Folate 9.7 >5.9 ng/mL  Iron and TIBC     Status: Abnormal   Collection Time: 07/14/15 10:22 AM  Result  Value Ref Range   Iron 13 (L) 28 -  170 ug/dL   TIBC 514 (H) 250 - 450 ug/dL   Saturation Ratios 3 (L) 10.4 - 31.8 %   UIBC 501 ug/dL  Ferritin     Status: Abnormal   Collection Time: 07/14/15 10:22 AM  Result Value Ref Range   Ferritin 3 (L) 11 - 307 ng/mL  Reticulocytes     Status: Abnormal   Collection Time: 07/14/15 10:22 AM  Result Value Ref Range   Retic Ct Pct 2.7 0.4 - 3.1 %   RBC. 3.20 (L) 3.87 - 5.11 MIL/uL   Retic Count, Manual 86.4 19.0 - 186.0 K/uL  POC occult blood, ED Provider will collect     Status: Abnormal   Collection Time: 07/14/15 10:46 AM  Result Value Ref Range   Fecal Occult Bld POSITIVE (A) NEGATIVE  Prepare RBC     Status: None   Collection Time: 07/14/15 11:01 AM  Result Value Ref Range   Order Confirmation ORDER PROCESSED BY BLOOD BANK     Dg Chest 2 View  07/14/2015  CLINICAL DATA:  Shortness of breath, dizziness EXAM: CHEST  2 VIEW COMPARISON:  06/02/2012 FINDINGS: Cardiomediastinal silhouette is stable. No acute infiltrate or pleural effusion. No pulmonary edema. Stable degenerative changes mid and lower thoracic spine. IMPRESSION: No active cardiopulmonary disease. Electronically Signed   By: Lahoma Crocker M.D.   On: 07/14/2015 11:22    ROSnegative except above Blood pressure 191/68, pulse 81, temperature 98 F (36.7 C), temperature source Oral, resp. rate 17, height '4\' 11"'  (1.499 m), weight 63.1 kg (139 lb 1.8 oz), SpO2 100 %. Physical ExamVital signs stable afebrile no acute distress exam please see preassessment evaluationlabs reviewed  Assessment/Plan: Iron deficiency guaiac positivity anemia in patient on aspirin and Plavix  Plan: We'll proceed with an endoscopy which was discussed with the patient with further workup and plans pending those findings  [place] E 07/14/2015, 3:35 PM

## 2015-07-14 NOTE — Op Note (Signed)
[facility] Patient Name: Kayla Mcclure Procedure Date : 07/14/2015 MRN: [id number] Attending MD: Clarene Essex , MD Date of Birth: Jan 21, 1946 CSN: [id number] Age: 70 Admit Type: Inpatient Procedure:                Upper GI endoscopy Indications:              Iron deficiency anemia secondary to chronic blood                            loss Providers:                Clarene Essex, MD, Dortha Schwalbe, RN, Cherylynn Ridges, Technician, Ralene Bathe, Technician Referring MD:              Medicines:                Fentanyl 80 micrograms IV, Midazolam 6 mg IV Complications:            No immediate complications. Estimated Blood Loss:     Estimated blood loss: none. Procedure:                Pre-Anesthesia Assessment:                           - Prior to the procedure, a History and Physical                            was performed, and patient medications and                            allergies were reviewed. The patient's tolerance of                            previous anesthesia was also reviewed. The risks                            and benefits of the procedure and the sedation                            options and risks were discussed with the patient.                            All questions were answered, and informed consent                            was obtained. Prior Anticoagulants: The patient has                            taken Plavix (clopidogrel), last dose was 1 day                            prior to procedure. ASA Grade Assessment: II - A  patient with mild systemic disease. After reviewing                            the risks and benefits, the patient was deemed in                            satisfactory condition to undergo the procedure.                           After obtaining informed consent, the endoscope was                            passed under direct vision. Throughout the      procedure, the patient's blood pressure, pulse, and                            oxygen saturations were monitored continuously. The                            [id number] scope was introduced through the                            mouth, and advanced to the third part of duodenum.                            The upper GI endoscopy was accomplished without                            difficulty. The patient tolerated the procedure                            fairly well. Scope In: Scope Out: Findings:      The larynx was normal.      A small hiatal hernia was present.      A single small no bleeding angioectasia was found in the gastric antrum.       Fulguration to ablate the lesion to prevent bleeding by argon beam at 1       liter/minute and 30 watts was successful. Estimated blood loss: none.      A few small angioectasias with stigmata of recent bleeding were found in       the third portion of the duodenum. Coagulation for hemostasis using       argon plasma at 1 liter/minute and 30 watts was successful. Estimated       blood loss: none.      The exam was otherwise without abnormality.the picture mechanism was       broken and although the pictures were normal on the endoscopy screening       they had a abnormal red hue on the report Impression:               - Normal larynx.                           - Small hiatal hernia.                           -  A single non-bleeding angioectasia in the                            stomach. Treated with argon beam coagulation.                           - A few recently bleeding angioectasias in the                            duodenum. Treated with argon plasma coagulation                            (APC).                           - The examination was otherwise normal.                           - No specimens collected. Moderate Sedation:      Moderate (conscious) sedation was administered by the endoscopy nurse       and supervised by  the endoscopist. The following parameters were       monitored: oxygen saturation, heart rate, blood pressure, respiratory       rate, EKG, adequacy of pulmonary ventilation, and response to care. Recommendation:           - Patient has a contact number available for                            emergencies. The signs and symptoms of potential                            delayed complications were discussed with the                            patient. Return to normal activities tomorrow.                            Written discharge instructions were provided to the                            patient.                           - Clear liquid diet today.                           - No ibuprofen, naproxen, or other non-steroidal                            anti-inflammatory drugs ong-term- No aspirin plavix                            for 3 days if okay with cardiology.                           - Return to GI  clinic PRN.                           - Telephone GI clinic if symptomatic PRN.                           - Perform CT scan (computed tomography) of the                            abdomen/pelvis at appointment to be scheduled. Procedure Code(s):        --- Professional ---                           [phone], Esophagogastroduodenoscopy, flexible,                            transoral; with control of bleeding, any method Diagnosis Code(s):        --- Professional ---                           K44.9, Diaphragmatic hernia without obstruction or                            gangrene                           K31.811, Angiodysplasia of stomach and duodenum                            with bleeding                           D50.0, Iron deficiency anemia secondary to blood                            loss (chronic) CPT copyright 2016 [employer]. All rights reserved. The codes documented in this report are preliminary and upon coder review may  be revised to meet current compliance  requirements. Clarene Essex, MD Clarene Essex, MD 07/14/2015 4:14:42 PM This report has been signed electronically. Number of Addenda: 0

## 2015-07-14 NOTE — Telephone Encounter (Signed)
Received a call from [facility] at [facility] Patients Hgb was 6.4 Discussed with Dr Ellyn Hack and will have patient go to ED at [facility], cancel cardiac cath for now Advised patient, verbalized understanding Called and spoke with Hassan Rowan RN at ED and notified patient would be coming, cancelled cath

## 2015-07-14 NOTE — ED Provider Notes (Signed)
CSN: [id number]     Arrival date & time 07/14/15  0935 History   First MD Initiated Contact with Patient 07/14/15 1006     Chief Complaint  Patient presents with  . low hemoglobin      (Consider location/radiation/quality/duration/timing/severity/associated sxs/prior Treatment) HPI Comments: Patient presents with a one-month history of worsening weakness, fatigue, dyspnea on exertion with chest pain on exertion. She saw her cardiologist yesterday and was being scheduled for another catheterization. She was found to have a hemoglobin of 6.4. She denies any ongoing rectal bleeding or dark stools. Denies any nausea or vomiting. There is some left-sided crampy abdominal pain. Reports history of ulcers in the past but states this has resolved. She takes aspirin and Plavix but no other blood thinners. She is on iron. She endorses generalized fatigue and weakness with lightheadedness and dyspnea with exertion and feels her heart pounding when she gets up and walks around.  The history is provided by the patient.    Past Medical History  Diagnosis Date  . Essential hypertension     a. Normal renal arteries by PV angio 05/2013.  Marland Kitchen Dyslipidemia, goal LDL below 70   . Internal hemorrhoids  04/11/2010    By colonoscopy  . Diverticulosis   04/11/2010     Colonoscopy  . Insomnia   . Asthma  10/31/2007  . Chronic back pain   . Iron deficiency anemia   . GERD (gastroesophageal reflux disease)   . Peripheral arterial disease ([place])     a. s/p PTA and stenting of L CIA stenosis 05/2013.  Marland Kitchen CAD (coronary artery disease) 08/05/2007    a) NSTEMI 2009: PCI to the RCA Promus DES 2.5 mm x 23 mm (3.0 mm). b) Myoview 05/2012: inferolateral ST changes that were more pronounced w/exercise, Imaging w/o Infarct or Ischemia, EF = 81%.   . CKD (chronic kidney disease), stage II   . Tobacco abuse     Quit in 05/2013  . Carotid artery disease ([place])     a. 50-69% RICA by outpt dopp 04/22/13, f/u due 04/2014.   Past  Surgical History  Procedure Laterality Date  . Colonoscopy  04/11/2010  . Iliac artery stent Left 05/04/2013    L CIA-EIA -- Dr. Gwenlyn Found  . Tonsillectomy  1960's  . Cholecystectomy  1975  . Coronary angioplasty with stent placement  08/05/2007    Promus DES 2.5 mm x 23 mm (3 mm)  to RCA; EF 60-70%  . Esophagogastroduodenoscopy N/A 09/13/2013    Procedure: ESOPHAGOGASTRODUODENOSCOPY (EGD);  Surgeon: Lear Ng, MD;  Location: [facility] ENDOSCOPY;  Service: Endoscopy;  Laterality: N/A;  . Lower extremity angiogram Bilateral 05/04/2013    Procedure: LOWER EXTREMITY ANGIOGRAM;  Surgeon: Lorretta Harp, MD;  Location: [facility] CATH LAB;  Service: Cardiovascular;  Laterality: Bilateral;   Family History  Problem Relation Age of Onset  . Diabetes Mother   . Heart disease Mother   . Heart attack Mother 2  . Breast cancer Mother 52  . Colon cancer Mother 52  . Diabetes Sister   . Heart attack Father 110  . Breast cancer Sister 64   Social History  Substance Use Topics  . Smoking status: Former Smoker    Types: Cigarettes    Quit date: 05/04/2013  . Smokeless tobacco: Never Used  . Alcohol Use: Yes     Comment: occasionally 3 times a year   OB History    Gravida Para Term Preterm AB TAB SAB Ectopic Multiple Living  1 1             Review of Systems  Constitutional: Positive for activity change, appetite change and fatigue. Negative for fever.  HENT: Positive for congestion. Negative for rhinorrhea.   Eyes: Negative for visual disturbance.  Respiratory: Positive for chest tightness and shortness of breath. Negative for cough.   Cardiovascular: Negative for chest pain.  Gastrointestinal: Negative for nausea, vomiting, abdominal pain and blood in stool.  Genitourinary: Negative for dysuria, hematuria, vaginal bleeding and vaginal discharge.  Musculoskeletal: Negative for myalgias and arthralgias.  Skin: Negative for rash.  Neurological: Positive for dizziness, weakness and  light-headedness. Negative for headaches.   A complete 10 system review of systems was obtained and all systems are negative except as noted in the HPI and PMH.     Allergies  Review of patient's allergies indicates no known allergies.  Home Medications   Prior to Admission medications   Medication Sig Start Date End Date Taking? Authorizing Provider  albuterol (PROVENTIL HFA;VENTOLIN HFA) 108 (90 BASE) MCG/ACT inhaler Inhale 2 puffs into the lungs every 4 (four) hours as needed. For shortness of breath   Yes Historical Provider, MD  amLODipine (NORVASC) 5 MG tablet Take 5 mg by mouth daily.   Yes Historical Provider, MD  aspirin 81 MG tablet Take 81 mg by mouth daily.   Yes Historical Provider, MD  atorvastatin (LIPITOR) 40 MG tablet Take 40 mg by mouth daily.   Yes Historical Provider, MD  clopidogrel (PLAVIX) 75 MG tablet Take 75 mg by mouth daily.   Yes Historical Provider, MD  ferrous sulfate 325 (65 FE) MG EC tablet Take 325 mg by mouth daily with breakfast.   Yes Historical Provider, MD  Fluticasone-Salmeterol (ADVAIR) 100-50 MCG/DOSE AEPB Inhale 1 puff into the lungs 2 (two) times daily as needed (shortness of breath).   Yes Historical Provider, MD  hydrochlorothiazide (HYDRODIURIL) 25 MG tablet Take 25 mg by mouth daily. 08/17/13  Yes Historical Provider, MD  isosorbide mononitrate (IMDUR) 60 MG 24 hr tablet Take 1 tablet (60 mg total) by mouth daily. 07/08/15  Yes Lorretta Harp, MD  loratadine (CLARITIN) 10 MG tablet Take 10 mg by mouth daily as needed for allergies.   Yes Historical Provider, MD  metoprolol succinate (TOPROL-XL) 100 MG 24 hr tablet Take 150 mg by mouth daily. Take with or immediately following a meal.   Yes Historical Provider, MD  nitroGLYCERIN (NITROSTAT) 0.4 MG SL tablet Place 1 tablet (0.4 mg total) under the tongue every 5 (five) minutes as needed for chest pain. 12/22/12  Yes Leonie Man, MD  pantoprazole (PROTONIX) 40 MG tablet Take 1 tablet (40 mg  total) by mouth daily. 09/14/13  Yes Shanker Kristeen Mans, MD   BP 110/68 mmHg  Pulse 115  Temp(Src) 98 F (36.7 C) (Oral)  Resp 12  Ht 4\' 11"  (1.499 m)  Wt 139 lb 1.8 oz (63.1 kg)  BMI 28.08 kg/m2  SpO2 95% Physical Exam  Constitutional: She is oriented to person, place, and time. She appears well-developed and well-nourished. No distress.  Pale appearing  HENT:  Head: Normocephalic and atraumatic.  Mouth/Throat: Oropharynx is clear and moist. No oropharyngeal exudate.  Eyes: Conjunctivae and EOM are normal. Pupils are equal, round, and reactive to light.  pallor  Neck: Normal range of motion. Neck supple.  No meningismus.  Cardiovascular: Normal rate, regular rhythm, normal heart sounds and intact distal pulses.   No murmur heard. Pulmonary/Chest: Effort normal and breath  sounds normal. No respiratory distress.  Abdominal: Soft. There is tenderness. There is no rebound and no guarding.  Mild LLQ tenderness  Genitourinary:  No gross blood, no hemorrhoids  Musculoskeletal: Normal range of motion. She exhibits no edema or tenderness.  Neurological: She is alert and oriented to person, place, and time. No cranial nerve deficit. She exhibits normal muscle tone. Coordination normal.  No ataxia on finger to nose bilaterally. No pronator drift. 5/5 strength throughout. CN 2-12 intact.Equal grip strength. Sensation intact.   Skin: Skin is warm.  Psychiatric: She has a normal mood and affect. Her behavior is normal.  Nursing note and vitals reviewed.   ED Course  Procedures (including critical care time) Labs Review Labs Reviewed  COMPREHENSIVE METABOLIC PANEL - Abnormal; Notable for the following:    CO2 21 (*)    Glucose, Bld 102 (*)    Creatinine, Ser 1.31 (*)    ALT 11 (*)    GFR calc non Af Amer 41 (*)    GFR calc Af Amer 47 (*)    All other components within normal limits  CBC - Abnormal; Notable for the following:    RBC 3.17 (*)    Hemoglobin 6.2 (*)    HCT 22.2 (*)     MCV 70.0 (*)    MCH 19.6 (*)    MCHC 27.9 (*)    RDW 17.7 (*)    All other components within normal limits  IRON AND TIBC - Abnormal; Notable for the following:    Iron 13 (*)    TIBC 514 (*)    Saturation Ratios 3 (*)    All other components within normal limits  FERRITIN - Abnormal; Notable for the following:    Ferritin 3 (*)    All other components within normal limits  RETICULOCYTES - Abnormal; Notable for the following:    RBC. 3.20 (*)    All other components within normal limits  POC OCCULT BLOOD, ED - Abnormal; Notable for the following:    Fecal Occult Bld POSITIVE (*)    All other components within normal limits  PROTIME-INR  TROPONIN I  VITAMIN B12  FOLATE  TROPONIN I  TROPONIN I  TYPE AND SCREEN  PREPARE RBC (CROSSMATCH)    Imaging Review Dg Chest 2 View  07/14/2015  CLINICAL DATA:  Shortness of breath, dizziness EXAM: CHEST  2 VIEW COMPARISON:  06/02/2012 FINDINGS: Cardiomediastinal silhouette is stable. No acute infiltrate or pleural effusion. No pulmonary edema. Stable degenerative changes mid and lower thoracic spine. IMPRESSION: No active cardiopulmonary disease. Electronically Signed   By: Lahoma Crocker M.D.   On: 07/14/2015 11:22   I have personally reviewed and evaluated these images and lab results as part of my medical decision-making.   EKG Interpretation   Date/Time:  Thursday July 14 2015 12:16:19 EDT Ventricular Rate:  79 PR Interval:  138 QRS Duration: 87 QT Interval:  384 QTC Calculation: 440 R Axis:   84 Text Interpretation:  Sinus rhythm Atrial premature complex Borderline  right axis deviation Baseline wander in lead(s) V2 V3 V4 V5 V6 Partial  missing lead(s): V2 V3 V4 V5 V6 Artifact T waves now upright Confirmed by  Hasty [id number]) on 07/14/2015 12:19:01 PM      MDM   Final diagnoses:  Symptomatic anemia   Patient sent by cardiologist for anemia. Denies any blood in the stool. She endorses generalized weakness with  dyspnea on exertion, dizziness and intermittent chest pain.  Hemoglobin  6.2. This is significantly decreased from her previous values in the 10 range. Hemoccult is positive.  IV PPI, blood transfusion  D/w Dr. Watt Climes of [facility], he will consult.  Admission d/w Nevin Bloodgood NP.  CRITICAL CARE Performed by: Ezequiel Essex Total critical care time: 45 minutes Critical care time was exclusive of separately billable procedures and treating other patients. Critical care was necessary to treat or prevent imminent or life-threatening deterioration. Critical care was time spent personally by me on the following activities: development of treatment plan with patient and/or surrogate as well as nursing, discussions with consultants, evaluation of patient's response to treatment, examination of patient, obtaining history from patient or surrogate, ordering and performing treatments and interventions, ordering and review of laboratory studies, ordering and review of radiographic studies, pulse oximetry and re-evaluation of patient's condition.  Ezequiel Essex, MD 07/14/15 [phone]

## 2015-07-14 NOTE — Progress Notes (Signed)
Quick Note:  Hgb is very low - will ask her to go to Hospital for transfusion & evaluation.. May need to post-pone cath.  Leonie Man, MD  ______

## 2015-07-14 NOTE — ED Notes (Signed)
MD at bedside. 

## 2015-07-15 ENCOUNTER — Encounter (HOSPITAL_COMMUNITY): Payer: Self-pay | Admitting: Radiology

## 2015-07-15 ENCOUNTER — Observation Stay (HOSPITAL_COMMUNITY): Payer: Medicare Other

## 2015-07-15 DIAGNOSIS — I10 Essential (primary) hypertension: Secondary | ICD-10-CM

## 2015-07-15 DIAGNOSIS — D509 Iron deficiency anemia, unspecified: Secondary | ICD-10-CM

## 2015-07-15 DIAGNOSIS — J45909 Unspecified asthma, uncomplicated: Secondary | ICD-10-CM | POA: Diagnosis not present

## 2015-07-15 DIAGNOSIS — I2510 Atherosclerotic heart disease of native coronary artery without angina pectoris: Secondary | ICD-10-CM | POA: Diagnosis not present

## 2015-07-15 DIAGNOSIS — Z9861 Coronary angioplasty status: Secondary | ICD-10-CM

## 2015-07-15 DIAGNOSIS — K2901 Acute gastritis with bleeding: Secondary | ICD-10-CM | POA: Diagnosis not present

## 2015-07-15 LAB — TROPONIN I: Troponin I: <0.03 ng/mL (ref <0.031)

## 2015-07-15 LAB — CBC
HEMATOCRIT: 25.4 % — AB (ref 36.0–46.0)
Hemoglobin: 7.7 g/dL — ABNORMAL LOW (ref 12.0–15.0)
MCH: 22.3 pg — ABNORMAL LOW (ref 26.0–34.0)
MCHC: 30.3 g/dL (ref 30.0–36.0)
MCV: 73.4 fL — AB (ref 78.0–100.0)
Platelets: 238 10*3/uL (ref 150–400)
RBC: 3.46 MIL/uL — ABNORMAL LOW (ref 3.87–5.11)
RDW: 18.1 % — AB (ref 11.5–15.5)
WBC: 7 10*3/uL (ref 4.0–10.5)

## 2015-07-15 LAB — HEMOGLOBIN AND HEMATOCRIT, BLOOD
HEMATOCRIT: 29.4 % — AB (ref 36.0–46.0)
Hemoglobin: 8.8 g/dL — ABNORMAL LOW (ref 12.0–15.0)

## 2015-07-15 LAB — PREPARE RBC (CROSSMATCH)

## 2015-07-15 MED ORDER — FERROUS GLUCONATE 324 (38 FE) MG PO TABS: 324.0000 mg | ORAL_TABLET | Freq: Two times a day (BID) | ORAL | Status: DC

## 2015-07-15 MED ORDER — FERROUS GLUCONATE 324 (38 FE) MG PO TABS
324.0000 mg | ORAL_TABLET | Freq: Two times a day (BID) | ORAL | Status: DC
  Administered 2015-07-15: 324 mg via ORAL
  Filled 2015-07-15: qty 1

## 2015-07-15 MED ORDER — SODIUM CHLORIDE 0.9 % IV SOLN
Freq: Once | INTRAVENOUS | Status: AC
  Administered 2015-07-15: 09:00:00 via INTRAVENOUS

## 2015-07-15 NOTE — Care Management Obs Status (Signed)
[place] NOTIFICATION   Patient Details  Name: AILETH FOO MRN: [id number] Date of Birth: [birth year]   Medicare Observation Status Notification Given:       Sharin Mons, RN 07/15/2015, 2:12 PM

## 2015-07-15 NOTE — Progress Notes (Signed)
Kayla Mcclure to be D/C'd Home per MD order.  Discussed with the patient and all questions fully answered.  VSS, Skin clean, dry and intact without evidence of skin break down, no evidence of skin tears noted. IV catheter discontinued intact. Site without signs and symptoms of complications. Dressing and pressure applied.  An After Visit Summary was printed and given to the patient. Patient received prescription.  D/c education completed with patient/family including follow up instructions, medication list, d/c activities limitations if indicated, with other d/c instructions as indicated by MD - patient able to verbalize understanding, all questions fully answered.   Patient instructed to return to ED, call 911, or call MD for any changes in condition.   Patient escorted via [place], and D/C home via private auto.  Malcolm Metro 07/15/2015 2:16 PM

## 2015-07-15 NOTE — Progress Notes (Signed)
Kayla Mcclure 9:47 AM  Subjective: Patient without signs of bleeding and no post endoscopy problems and no new complaints and we reviewed her CT and endoscopic findings with her  Objective: Vital signs stable afebrile no acute distress abdomen is soft nontender hemoglobin stable CT okay  Assessment: Probable bleeding from AVMs and aspirin and Plavix  Plan: Okay to advance diet and I will see back in the office in a week or 2 to probably set up a capsule endoscopy if okay with insurance if not may need a colonoscopy first which we discussed and will need to discuss with cardiology the bare minimum of blood thinners she requires and I have recommended continuing iron and more frequent hemoglobin checks  Kayla Mcclure E  Pager [phone] After 5PM or if no answer call [phone]

## 2015-07-15 NOTE — Discharge Summary (Signed)
Physician Discharge Summary   Patient ID: Kayla Mcclure MRN: [id number] DOB/AGE: 1945-11-25 70 y.o.  Admit date: 07/14/2015 Discharge date: 07/15/2015  Primary Care Physician:  Antony Blackbird, MD  Discharge Diagnoses:    . GI bleed Secondary to gastric and duodenal angiectasia . Asthma . Iron deficiency anemia . Essential hypertension, benign  Consults:   Gastroenterology, Dr Watt Climes  Recommendations for Outpatient Follow-up:  1. Please note aspirin and Plavix are currently on hold until further recommendations from patient's cardiology and gastroenterology follow-ups recommendations. 2. Please repeat CBC/BMET at next visit   DIET: Heart healthy diet    Allergies:  No Known Allergies   DISCHARGE MEDICATIONS: Current Discharge Medication List    START taking these medications   Details  ferrous gluconate (FERGON) 324 MG tablet Take 1 tablet (324 mg total) by mouth 2 (two) times daily with a meal. Qty: 60 tablet, Refills: 3      CONTINUE these medications which have NOT CHANGED   Details  albuterol (PROVENTIL HFA;VENTOLIN HFA) 108 (90 BASE) MCG/ACT inhaler Inhale 2 puffs into the lungs every 4 (four) hours as needed. For shortness of breath    amLODipine (NORVASC) 5 MG tablet Take 5 mg by mouth daily.    atorvastatin (LIPITOR) 40 MG tablet Take 40 mg by mouth daily.    Fluticasone-Salmeterol (ADVAIR) 100-50 MCG/DOSE AEPB Inhale 1 puff into the lungs 2 (two) times daily as needed (shortness of breath).    isosorbide mononitrate (IMDUR) 60 MG 24 hr tablet Take 1 tablet (60 mg total) by mouth daily. Qty: 30 tablet, Refills: 11   Associated Diagnoses: Angina, class I (HCC)    loratadine (CLARITIN) 10 MG tablet Take 10 mg by mouth daily as needed for allergies.    metoprolol succinate (TOPROL-XL) 100 MG 24 hr tablet Take 150 mg by mouth daily. Take with or immediately following a meal.    nitroGLYCERIN (NITROSTAT) 0.4 MG SL tablet Place 1 tablet (0.4 mg total) under  the tongue every 5 (five) minutes as needed for chest pain. Qty: 25 tablet, Refills: 6   Associated Diagnoses: Angina, class I (HCC)    pantoprazole (PROTONIX) 40 MG tablet Take 1 tablet (40 mg total) by mouth daily. Qty: 30 tablet, Refills: 0      STOP taking these medications     aspirin 81 MG tablet      clopidogrel (PLAVIX) 75 MG tablet      ferrous sulfate 325 (65 FE) MG EC tablet      hydrochlorothiazide (HYDRODIURIL) 25 MG tablet          Brief H and P: For complete details please refer to admission H and P, but in briefDarline D Mcclure is a 70 y.o. female with a PMH not limited to asthma ,CAD / stenting 2009, CKD, HTN, carotid artery disease, and PAD s/p revascularization of left common and external iliac artery February 2015. Patient was seen by her cardiologist a day before the admission for  progressive exertional chest discomfort. Cardiac cath was scheduled but then labs returned showing hgb of 6.2. She is on plavix and asa. Patient was sent by cardiology to ED for further evaluation of anemia. In 2015 patient had APC of a gastric AVM (EGD for heme positive stool). No abdominal pain or nausea.   Hospital Course:  Severe iron deficiency, upper GI bleed hemoglobin 6.2 at the time of admission with known history of gastric AVM - Anemia panel showed severe iron deficiency with ferritin of 3, FOBT positive. -  Patient was transfused 3 packed RBCs during this admission - Aspirin and Plavix were held and GI was consulted. - Patient underwent endoscopy which showed a single nonbleeding angiectasia in the stomach and a few recently bleeding angiectasias is in the duodenum, treated with argon plasma coagulation -Aspirin and Plavix have currently been placed on hold until follow-up appointment with cardiology and GI. Repeat hemoglobin after transfusion 8.8 at the time of discharge  Exertional chest pain, relieved with rest. Some radiation into jaw. Most likely secondary to profound  anemia. -Troponins remained negative, patient was continued on Imdur, Toprol, Norvasc.  Hypertension. Controlled.  -Continue anti-hypertensives as listed above.  CAD / stenting 2009.  -holding plavix and asa for now for upper GI bleed/ anemia / heme + stools -continue Imdur, Toprol, Norvasc, statin.   CKD, stage 3. Cr 1.31, baseline 1.5  PAD and carotid artery disease, s/p PTA and stenting of left common iliac artery 2015. Now with heaviness in RLE with ambulation. Patient scheduled for carotid dopplers and RLE ABIs later this month -hold plavix / asa for now given anemia / heme+ stools. Patient to follow with cardiology for further recommendations.  Hyperlipidemia -continue home statin  Asthma.Stable -continue home inhalers   Day of Discharge BP 105/49 mmHg  Pulse 88  Temp(Src) 98.3 F (36.8 C) (Oral)  Resp 20  Ht 4\' 11"  (1.499 m)  Wt 63.1 kg (139 lb 1.8 oz)  BMI 28.08 kg/m2  SpO2 95%  Physical Exam: General: Alert and awake oriented x3 not in any acute distress. HEENT: anicteric sclera, pupils reactive to light and accommodation CVS: S1-S2 clear no murmur rubs or gallops Chest: clear to auscultation bilaterally, no wheezing rales or rhonchi Abdomen: soft nontender, nondistended, normal bowel sounds Extremities: no cyanosis, clubbing or edema noted bilaterally Neuro: Cranial nerves II-XII intact, no focal neurological deficits   The results of significant diagnostics from this hospitalization (including imaging, microbiology, ancillary and laboratory) are listed below for reference.    LAB RESULTS: Basic Metabolic Panel:  Recent Labs Lab 07/14/15 0944  NA 140  K 4.5  CL 107  CO2 21*  GLUCOSE 102*  BUN 17  CREATININE 1.31*  CALCIUM 9.1   Liver Function Tests:  Recent Labs Lab 07/14/15 0944  AST 20  ALT 11*  ALKPHOS 72  BILITOT 0.5  PROT 7.8  ALBUMIN 3.5   No results for input(s): LIPASE, AMYLASE in the last 168 hours. No results for  input(s): AMMONIA in the last 168 hours. CBC:  Recent Labs Lab 07/13/15 1135  07/14/15 2226 07/15/15 0417  WBC 7.4  < > 7.1 7.0  NEUTROABS 4662  --   --   --   HGB 6.4*  < > 8.1* 7.7*  HCT 22.2*  < > 27.6* 25.4*  MCV 69.2*  < > 75.0* 73.4*  PLT 310  < > 235 238  < > = values in this interval not displayed. Cardiac Enzymes:  Recent Labs Lab 07/14/15 1651 07/14/15 2226  TROPONINI <0.03 <0.03   BNP: Invalid input(s): POCBNP CBG: No results for input(s): GLUCAP in the last 168 hours.  Significant Diagnostic Studies:  Dg Chest 2 View  07/14/2015  CLINICAL DATA:  Shortness of breath, dizziness EXAM: CHEST  2 VIEW COMPARISON:  06/02/2012 FINDINGS: Cardiomediastinal silhouette is stable. No acute infiltrate or pleural effusion. No pulmonary edema. Stable degenerative changes mid and lower thoracic spine. IMPRESSION: No active cardiopulmonary disease. Electronically Signed   By: Lahoma Crocker M.D.   On: 07/14/2015 11:22  Ct Abdomen Pelvis W Contrast  07/15/2015  CLINICAL DATA:  Anemia. EXAM: CT ABDOMEN AND PELVIS WITH CONTRAST TECHNIQUE: Multidetector CT imaging of the abdomen and pelvis was performed using the standard protocol following bolus administration of intravenous contrast. CONTRAST:  100 ISOVUE-300 IOPAMIDOL (ISOVUE-300) INJECTION 61% COMPARISON:  07/29/2013 FINDINGS: Lower chest:  Unremarkable. Hepatobiliary: No focal abnormality within the liver parenchyma. Gallbladder surgically absent. No intrahepatic or extrahepatic biliary dilation. Pancreas: No focal mass lesion. No dilatation of the main duct. No intraparenchymal cyst. No peripancreatic edema. Spleen: No splenomegaly. No focal mass lesion. Adrenals/Urinary Tract: No adrenal nodule or mass. 6 mm low-density lesion upper pole left kidney was about 4 mm previously and is probably a tiny cortical cyst. Right kidney is unremarkable. No evidence for hydroureter. The urinary bladder appears normal for the degree of distention.  Stomach/Bowel: Stomach is nondistended. No gastric wall thickening. No evidence of outlet obstruction. Duodenum is normally positioned as is the ligament of Treitz. No small bowel wall thickening. No small bowel dilatation. The terminal ileum is normal. The appendix is normal. Diverticular changes are noted in the left colon without evidence of diverticulitis. Vascular/Lymphatic: There is abdominal aortic atherosclerosis without aneurysm. All small lymph nodes are seen in the retroperitoneal space, as before. No abdominal lymphadenopathy. No pelvic sidewall lymphadenopathy. Reproductive: Calcified fibroids are seen in the uterus. There is no adnexal mass. Other: No intraperitoneal free fluid. Musculoskeletal: Bone windows reveal no worrisome lytic or sclerotic osseous lesions. IMPRESSION: 1. No definite CT findings to explain the patient's history of anemia. Minimal left colonic diverticulosis without acute abnormality in the abdomen or pelvis. 2. Status post cholecystectomy. 3. Uterine fibroid changes. 4. Abdominal aortic atherosclerosis. Electronically Signed   By: Misty Stanley M.D.   On: 07/15/2015 08:27    2D ECHO:   Disposition and Follow-up: Discharge Instructions    Diet - low sodium heart healthy    Complete by:  As directed      Discharge instructions    Complete by:  As directed   Please continue to hold aspirin and Plavix until further conditions from your  gastroenterologist and cardiologist     Increase activity slowly    Complete by:  As directed             DISPOSITION: HOME    DISCHARGE FOLLOW-UP Follow-up Information    Follow up with FULP, CAMMIE, MD. Schedule an appointment as soon as possible for a visit in 2 weeks.   Specialty:  Family Medicine   Why:  for hospital follow-up   Contact information:   [id number] [place] [address] [phone]       Follow up with [facility] E, MD. Schedule an appointment as soon as possible for a visit in 2 weeks.    Specialty:  Gastroenterology   Why:  for hospital follow-up   Contact information:   [address] [place] [address] [phone]       Follow up with [facility], Leonie Green, MD. Schedule an appointment as soon as possible for a visit in 10 days.   Specialty:  Cardiology   Why:  for hospital follow-up   Contact information:   [address] [place] [address] [phone]        Time spent on Discharge: 28MINS   Signed:   Willena Jeancharles M.D. [facility] 07/15/2015, 1:13 PM Pager: [phone]

## 2015-07-15 NOTE — Progress Notes (Signed)
BRIEF NUTRITION NOTE;  Pt seen for MST. Pt BMI is categorized as overweight. Pt weight has been stable for past few years. Pt  Has a good appetite, no N/V, and does not take nutritional supplements at home. Pt seems to be meeting her nutritional needs and has no underlying issues that warrant a nutrition intervention at this time. Please consult if further nutritional issues arise.  Geoffery Lyons, [place] [facility] Dietetic Intern Pager [phone]

## 2015-07-16 LAB — TYPE AND SCREEN
ABO/RH(D): A POS
Antibody Screen: NEGATIVE
UNIT DIVISION: 0
UNIT DIVISION: 0
Unit division: 0

## 2015-07-18 ENCOUNTER — Ambulatory Visit (HOSPITAL_COMMUNITY): Admission: RE | Admit: 2015-07-18 | Payer: Medicare Other | Source: Ambulatory Visit | Admitting: Cardiology

## 2015-07-18 ENCOUNTER — Encounter (HOSPITAL_COMMUNITY): Admission: RE | Payer: Self-pay | Source: Ambulatory Visit

## 2015-07-18 SURGERY — LEFT HEART CATH AND CORONARY ANGIOGRAPHY

## 2015-07-19 ENCOUNTER — Encounter (HOSPITAL_COMMUNITY): Payer: Self-pay | Admitting: Gastroenterology

## 2015-07-22 ENCOUNTER — Other Ambulatory Visit: Payer: Self-pay | Admitting: Cardiovascular Disease

## 2015-07-22 DIAGNOSIS — I739 Peripheral vascular disease, unspecified: Secondary | ICD-10-CM

## 2015-07-22 DIAGNOSIS — I209 Angina pectoris, unspecified: Secondary | ICD-10-CM

## 2015-07-22 DIAGNOSIS — I779 Disorder of arteries and arterioles, unspecified: Secondary | ICD-10-CM

## 2015-07-25 ENCOUNTER — Ambulatory Visit (HOSPITAL_COMMUNITY)
Admission: RE | Admit: 2015-07-25 | Discharge: 2015-07-25 | Disposition: A | Discharge status: Home / Self Care | Payer: Medicare Other | Source: Ambulatory Visit | Attending: Cardiovascular Disease | Admitting: Cardiovascular Disease

## 2015-07-25 DIAGNOSIS — I6523 Occlusion and stenosis of bilateral carotid arteries: Secondary | ICD-10-CM | POA: Diagnosis not present

## 2015-07-25 DIAGNOSIS — I779 Disorder of arteries and arterioles, unspecified: Secondary | ICD-10-CM | POA: Diagnosis not present

## 2015-07-25 DIAGNOSIS — Z720 Tobacco use: Secondary | ICD-10-CM | POA: Insufficient documentation

## 2015-07-25 DIAGNOSIS — I2510 Atherosclerotic heart disease of native coronary artery without angina pectoris: Secondary | ICD-10-CM | POA: Diagnosis not present

## 2015-07-25 DIAGNOSIS — K219 Gastro-esophageal reflux disease without esophagitis: Secondary | ICD-10-CM | POA: Diagnosis not present

## 2015-07-25 DIAGNOSIS — I708 Atherosclerosis of other arteries: Secondary | ICD-10-CM | POA: Diagnosis not present

## 2015-07-25 DIAGNOSIS — I209 Angina pectoris, unspecified: Secondary | ICD-10-CM | POA: Insufficient documentation

## 2015-07-25 DIAGNOSIS — I700 Atherosclerosis of aorta: Secondary | ICD-10-CM | POA: Diagnosis not present

## 2015-07-25 DIAGNOSIS — I739 Peripheral vascular disease, unspecified: Secondary | ICD-10-CM

## 2015-07-29 ENCOUNTER — Telehealth: Payer: Self-pay | Admitting: *Deleted

## 2015-07-29 DIAGNOSIS — I779 Disorder of arteries and arterioles, unspecified: Secondary | ICD-10-CM

## 2015-07-29 DIAGNOSIS — I739 Peripheral vascular disease, unspecified: Secondary | ICD-10-CM

## 2015-07-29 NOTE — Telephone Encounter (Signed)
-----   Message from Lorretta Harp, MD sent at 07/28/2015  9:55 AM EDT ----- No change from prior study. Repeat in 12 months.

## 2015-08-01 ENCOUNTER — Ambulatory Visit (INDEPENDENT_AMBULATORY_CARE_PROVIDER_SITE_OTHER): Payer: Medicare Other | Admitting: Physician Assistant

## 2015-08-01 ENCOUNTER — Encounter: Payer: Self-pay | Admitting: Physician Assistant

## 2015-08-01 VITALS — BP 150/68 | HR 76 | Ht 59.0 in | Wt 139.0 lb

## 2015-08-01 DIAGNOSIS — I209 Angina pectoris, unspecified: Secondary | ICD-10-CM

## 2015-08-01 DIAGNOSIS — I2510 Atherosclerotic heart disease of native coronary artery without angina pectoris: Secondary | ICD-10-CM

## 2015-08-01 DIAGNOSIS — I10 Essential (primary) hypertension: Secondary | ICD-10-CM | POA: Diagnosis not present

## 2015-08-01 DIAGNOSIS — I739 Peripheral vascular disease, unspecified: Secondary | ICD-10-CM | POA: Diagnosis not present

## 2015-08-01 DIAGNOSIS — Z9861 Coronary angioplasty status: Secondary | ICD-10-CM

## 2015-08-01 DIAGNOSIS — E785 Hyperlipidemia, unspecified: Secondary | ICD-10-CM

## 2015-08-01 MED ORDER — ISOSORBIDE MONONITRATE ER 60 MG PO TB24: 90.0000 mg | ORAL_TABLET | Freq: Every day | ORAL | Status: DC

## 2015-08-01 NOTE — Patient Instructions (Signed)
Your physician recommends that you schedule a follow-up appointment in: 1 Month with Dr Ellyn Hack  Your physician has recommended you make the following change in your medication: Increase Isosorbide to 90 mg daily

## 2015-08-01 NOTE — Progress Notes (Signed)
Patient ID: Kayla Mcclure, female   DOB: [birth year], 70 y.o.   MRN: [id number]    Date:  08/01/2015   ID:  Kayla Mcclure, DOB Aug 11, 1945, MRN [id number]  PCP:  Antony Blackbird, MD  Primary Cardiologist:  Ellyn Hack   Chief Complaint  Patient presents with  . Follow-up    Cramping in feet and legs, occ dizziness & lightheadedness last week     History of Present Illness: Kayla Mcclure is a 70 y.o. female with past medical history listed below. She was seen 07/13/2015 for follow-up of progressively worsening episodes of exertional chest discomfort radiating to her jaw. These episodes last maybe 2-4 minutes and are relieved with rest. They're now happening with simply walking from the waiting area back to the clinic room. Is associated with dyspnea. She describes it as a 6 pressure and squeezing type sensation along with a sharp component. It does go to her jaw and left arm a little bit. She doesn't have any sensation of nausea, but she does have a sensation of rapid heartbeat or hard heartbeat when this occurs. It usually resolves spontaneously, she has not taken nitroglycerin. She saw Dr. Gwenlyn Found the week before seeing Dr. Ellyn Hack and he increased her Imdur from 30 mg to 60 mg, and she is still not noted any benefit.   Patient presents today for follow-up. She was supposed to undergo left heart catheterization however, her hemoglobin was as low as 6.2 she was admitted for evaluation.  She had an EGD by Dr. Marcellina Millin on that she had probable bleeding from AVMs. Aspirin Plavix was discontinued. He is potentially going to follow-up with a capsule endoscopy and/or colonoscopy.  Patient reports about a month ago she had an episode of more severe chest pain under her left breast with diaphoresis and tingling in her fingers and in her left arm. At that time she was going to take some nitroglycerin however she was worried about moving and the pain resolved on its own. Since then she's only had very short sharp  episodes upon her left breast. At the most is 3 out of 10 in intensity. They just last a few seconds.  The patient currently denies nausea, vomiting, fever, chest pain, shortness of breath, orthopnea, dizziness, PND, cough, congestion, abdominal pain, hematochezia, melena, lower extremity edema, claudication.  Wt Readings from Last 3 Encounters:  08/01/15 139 lb (63.05 kg)  07/14/15 139 lb 1.8 oz (63.1 kg)  07/13/15 140 lb 9.6 oz (63.776 kg)     Past Medical History  Diagnosis Date  . Essential hypertension     a. Normal renal arteries by PV angio 05/2013.  Marland Kitchen Dyslipidemia, goal LDL below 70   . Internal hemorrhoids  04/11/2010    By colonoscopy  . Diverticulosis   04/11/2010     Colonoscopy  . Insomnia   . Asthma  10/31/2007  . Chronic back pain   . Iron deficiency anemia   . GERD (gastroesophageal reflux disease)   . Peripheral arterial disease ([place])     a. s/p PTA and stenting of L CIA stenosis 05/2013.  Marland Kitchen CAD (coronary artery disease) 08/05/2007    a) NSTEMI 2009: PCI to the RCA Promus DES 2.5 mm x 23 mm (3.0 mm). b) Myoview 05/2012: inferolateral ST changes that were more pronounced w/exercise, Imaging w/o Infarct or Ischemia, EF = 81%.   . CKD (chronic kidney disease), stage II   . Tobacco abuse     Quit in 05/2013  . Carotid  artery disease ([place])     a. 50-69% RICA by outpt dopp 04/22/13, f/u due 04/2014.  Marland Kitchen Renal insufficiency     Current Outpatient Prescriptions  Medication Sig Dispense Refill  . albuterol (PROVENTIL HFA;VENTOLIN HFA) 108 (90 BASE) MCG/ACT inhaler Inhale 2 puffs into the lungs every 4 (four) hours as needed. For shortness of breath    . amLODipine (NORVASC) 5 MG tablet Take 5 mg by mouth daily.    Marland Kitchen atorvastatin (LIPITOR) 40 MG tablet Take 40 mg by mouth daily.    . ferrous gluconate (FERGON) 324 MG tablet Take 1 tablet (324 mg total) by mouth 2 (two) times daily with a meal. 60 tablet 3  . Fluticasone-Salmeterol (ADVAIR) 100-50 MCG/DOSE AEPB Inhale 1 puff  into the lungs 2 (two) times daily as needed (shortness of breath).    . isosorbide mononitrate (IMDUR) 60 MG 24 hr tablet Take 1 tablet (60 mg total) by mouth daily. 30 tablet 11  . loratadine (CLARITIN) 10 MG tablet Take 10 mg by mouth daily as needed for allergies.    . metoprolol succinate (TOPROL-XL) 100 MG 24 hr tablet Take 150 mg by mouth daily. Take with or immediately following a meal.    . nitroGLYCERIN (NITROSTAT) 0.4 MG SL tablet Place 1 tablet (0.4 mg total) under the tongue every 5 (five) minutes as needed for chest pain. 25 tablet 6  . pantoprazole (PROTONIX) 40 MG tablet Take 1 tablet (40 mg total) by mouth daily. 30 tablet 0   No current facility-administered medications for this visit.    Allergies:   No Known Allergies  Social History:  The patient  reports that she quit smoking about 2 years ago. Her smoking use included Cigarettes. She has never used smokeless tobacco. She reports that she drinks alcohol. She reports that she does not use illicit drugs.   Family history:   Family History  Problem Relation Age of Onset  . Diabetes Mother   . Heart disease Mother   . Heart attack Mother 30  . Breast cancer Mother 17  . Colon cancer Mother 48  . Diabetes Sister   . Heart attack Father 83  . Breast cancer Sister 31    ROS:  Please see the history of present illness.  All other systems reviewed and negative.   PHYSICAL EXAM: VS:  BP 150/68 mmHg  Pulse 76  Ht 4\' 11"  (1.499 m)  Wt 139 lb (63.05 kg)  BMI 28.06 kg/m2 Well nourished, well developed, in no acute distress HEENT: Pupils are equal round react to light accommodation extraocular movements are intact. Conjunctiva are pale Neck: no JVDNo cervical lymphadenopathy. Cardiac: Regular rate and rhythm without murmurs rubs or gallops. Lungs:  clear to auscultation bilaterally, no wheezing, rhonchi or rales Abd: soft, nontender, positive bowel sounds all quadrants, no hepatosplenomegaly Ext: Slow capillary refill.   no lower extremity edema.  2+ radial and dorsalis pedis pulses. Skin: warm and dry Neuro:  Grossly normal   ASSESSMENT AND PLAN:  Problem List Items Addressed This Visit    PAD (peripheral artery disease) - bilateral CIA & CFA 50-69%; Claudication (Chronic)   Essential hypertension, benign (Chronic)   Dyslipidemia, goal LDL below 70 (Chronic)   CAD S/P percutaneous coronary angioplasty -- Promus DES 2.5 mm x 20 mm postdilated to 3 mm (Chronic)   Angina, class IV ([place])    Other Visit Diagnoses    Angina, class I ([place])    -  Primary  Frequency of angina seems to have diminished however, she still having some a few times per week. I have increase her Imdur further to 90 mg. Her blood pressure will tolerate it. She is still off of her aspirin and Plavix. She'll follow-up with Dr. Francene Castle May 10. I'll arrange for follow-up with Dr. Ellyn Hack in one month and hopefully will have some information regarding her GI issues. Continue her on Lipitor metoprolol 150 mg daily protonic's and Norvasc 5 mg.

## 2015-08-08 ENCOUNTER — Other Ambulatory Visit: Payer: Self-pay

## 2015-08-08 DIAGNOSIS — I209 Angina pectoris, unspecified: Secondary | ICD-10-CM

## 2015-08-08 MED ORDER — ISOSORBIDE MONONITRATE ER 60 MG PO TB24: 90.0000 mg | ORAL_TABLET | Freq: Every day | ORAL | Status: DC

## 2015-09-01 ENCOUNTER — Encounter: Payer: Self-pay | Admitting: Cardiology

## 2015-09-01 ENCOUNTER — Ambulatory Visit (INDEPENDENT_AMBULATORY_CARE_PROVIDER_SITE_OTHER): Payer: Medicare Other | Admitting: Cardiology

## 2015-09-01 VITALS — BP 130/70 | HR 70 | Ht 59.0 in | Wt 137.8 lb

## 2015-09-01 DIAGNOSIS — R0609 Other forms of dyspnea: Secondary | ICD-10-CM

## 2015-09-01 DIAGNOSIS — E785 Hyperlipidemia, unspecified: Secondary | ICD-10-CM

## 2015-09-01 DIAGNOSIS — I739 Peripheral vascular disease, unspecified: Secondary | ICD-10-CM

## 2015-09-01 DIAGNOSIS — I2510 Atherosclerotic heart disease of native coronary artery without angina pectoris: Secondary | ICD-10-CM

## 2015-09-01 DIAGNOSIS — D509 Iron deficiency anemia, unspecified: Secondary | ICD-10-CM

## 2015-09-01 DIAGNOSIS — R079 Chest pain, unspecified: Secondary | ICD-10-CM | POA: Diagnosis not present

## 2015-09-01 DIAGNOSIS — Z9861 Coronary angioplasty status: Secondary | ICD-10-CM

## 2015-09-01 DIAGNOSIS — I10 Essential (primary) hypertension: Secondary | ICD-10-CM

## 2015-09-01 LAB — CBC
HEMATOCRIT: 37.1 % (ref 35.0–45.0)
HEMOGLOBIN: 11.3 g/dL — AB (ref 11.7–15.5)
MCH: 23.7 pg — AB (ref 27.0–33.0)
MCHC: 30.5 g/dL — ABNORMAL LOW (ref 32.0–36.0)
MCV: 77.8 fL — AB (ref 80.0–100.0)
PLATELETS: 256 10*3/uL (ref 140–400)
RBC: 4.77 MIL/uL (ref 3.80–5.10)
RDW: 22.9 % — ABNORMAL HIGH (ref 11.0–15.0)
WBC: 7.7 10*3/uL (ref 3.8–10.8)

## 2015-09-01 NOTE — Patient Instructions (Signed)
Your physician has requested that you have an echocardiogram at [facility]. Echocardiography is a painless test that uses sound waves to create images of your heart. It provides your doctor with information about the size and shape of your heart and how well your heart's chambers and valves are working. This procedure takes approximately one hour. There are no restrictions for this procedure.    Your physician has requested that you have en exercise stress myoview AT [place]. [address]. For further information please visit [email or web address]. Please follow instruction sheet, as given.  PLEASE HAVE LABS DONE-CBC  Your physician recommends that you schedule a follow-up appointment in July 2017 WITH DR [facility].  If you need a refill on your cardiac medications before your next appointment, please call your pharmacy.

## 2015-09-01 NOTE — Progress Notes (Signed)
PCP: Antony Blackbird, MD  Clinic Note: Chief Complaint  Patient presents with  . Follow-up    Plan catheterization was discontinued secondary to anemia.  . Coronary Artery Disease    Atypical anginal type symptoms    HPI: Kayla Mcclure is a 70 y.o. female with a PMH below who presents today for 1 month follow-up of CAD, angina and anemia.  Kateline D Zoltowski was last seen on May 1 by Tarri Fuller, PA-C. That touches action noticing cramping in her legs and feet. When I saw her back in April she was noticing progressively worsening episodes of exertional chest pain radiating to the jaw, relieved with strep rest. The plan was for heart catheterization, but this was postponed due to anemia and requiring transfusion. She had an EGD that showed possible bleeding AVMs and aspirin/Plavix were discontinued. The plan was next to do a capsule endoscopy. When she saw Gaspar Bidding she had noted another episode of severe left-sided chest pain that resolved prior to taking nitroglycerin. Is very short sharp pain episodes on the left wrist since.  Recent Hospitalizations: April 13-14, 2017 admitted for GI bleed secondary to gastric and duodenal angiectasia..  This hospitalization was in lieu of her coming in for catheterization. She was transfused. Aspirin and Plavix were stopped.  Studies Reviewed:  Carotid Dopplers and lower extremity arterial Dopplers - per Dr. Gwenlyn Found, both are stable with plans for annual follow-up  Interval History: [name] presents today really not noticing the same amount of discomfort she was before. She describes it as sharp pain underneath her left breast as well as left arm pain with numbness. This can happen at rest and with exertion. She also notes that she gets lightheaded and dizzy at times oftentimes with certain motion and movement. This is not however associated with chest discomfort. She also denies any significant rapid irregular heartbeats. She is not having heart failure  symptoms of PND, orthopnea or edema. She really doesn't describe having the chest discomfort with exertion either. She does however note some exertional dyspnea.   No palpitations, syncope/near syncope, or TIA/amaurosis fugax symptoms. No melena, hematochezia, hematuria, or epstaxis. No claudication.  She is scheduled to have capsule endoscopy done by GI medicine. Continues to be off of aspirin and Plavix until this is done.  ROS: A comprehensive was performed. Review of Systems  Constitutional: Negative for malaise/fatigue.  HENT: Negative for congestion and nosebleeds.   Respiratory: Negative for cough, shortness of breath and wheezing.   Gastrointestinal: Negative for blood in stool and melena.  Genitourinary: Negative for hematuria.  Musculoskeletal: Positive for joint pain. Negative for myalgias and falls.  Neurological: Positive for dizziness and headaches.  Endo/Heme/Allergies: Does not bruise/bleed easily.  Psychiatric/Behavioral: Negative for depression and memory loss. The patient is nervous/anxious (She is anxious about all these issues with her anemia and chest discomfort etc.). The patient does not have insomnia.   All other systems reviewed and are negative.    Past Medical History  Diagnosis Date  . Essential hypertension     a. Normal renal arteries by PV angio 05/2013.  Marland Kitchen Dyslipidemia, goal LDL below 70   . Internal hemorrhoids  04/11/2010    By colonoscopy  . Diverticulosis   04/11/2010     Colonoscopy  . Insomnia   . Asthma  10/31/2007  . Chronic back pain   . Iron deficiency anemia   . GERD (gastroesophageal reflux disease)   . Peripheral arterial disease ([place])     a. s/p  PTA and stenting of L CIA stenosis 05/2013.  Marland Kitchen CAD (coronary artery disease) 08/05/2007    a) NSTEMI 2009: PCI to the RCA Promus DES 2.5 mm x 23 mm (3.0 mm). b) Myoview 05/2012: inferolateral ST changes that were more pronounced w/exercise, Imaging w/o Infarct or Ischemia, EF = 81%.   . CKD  (chronic kidney disease), stage II   . Tobacco abuse     Quit in 05/2013  . Carotid artery disease ([place])     a. 50-69% RICA by outpt dopp 04/22/13, f/u due 04/2014.  Marland Kitchen Renal insufficiency     Past Surgical History  Procedure Laterality Date  . Colonoscopy  04/11/2010  . Iliac artery stent Left 05/04/2013    L CIA-EIA -- Dr. Gwenlyn Found  . Tonsillectomy  1960's  . Cholecystectomy  1975  . Coronary angioplasty with stent placement  08/05/2007    Promus DES 2.5 mm x 23 mm (3 mm)  to RCA; EF 60-70%  . Esophagogastroduodenoscopy N/A 09/13/2013    Procedure: ESOPHAGOGASTRODUODENOSCOPY (EGD);  Surgeon: Lear Ng, MD;  Location: [facility] ENDOSCOPY;  Service: Endoscopy;  Laterality: N/A;  . Lower extremity angiogram Bilateral 05/04/2013    Procedure: LOWER EXTREMITY ANGIOGRAM;  Surgeon: Lorretta Harp, MD;  Location[facility] CATH LAB;  Service: Cardiovascular;  Laterality: Bilateral;  . Esophagogastroduodenoscopy N/A 07/14/2015    Procedure: ESOPHAGOGASTRODUODENOSCOPY (EGD);  Surgeon: Clarene Essex, MD;  Location: [facility] ENDOSCOPY;  Service: Endoscopy;  Laterality: N/A;   Prior to Admission medications   Medication Sig Start Date End Date Taking? Authorizing Provider  albuterol (PROVENTIL HFA;VENTOLIN HFA) 108 (90 BASE) MCG/ACT inhaler Inhale 2 puffs into the lungs every 4 (four) hours as needed. For shortness of breath   Yes Historical Provider, MD  amLODipine (NORVASC) 5 MG tablet Take 5 mg by mouth daily.   Yes Historical Provider, MD  atorvastatin (LIPITOR) 40 MG tablet Take 40 mg by mouth daily.   Yes Historical Provider, MD  ferrous gluconate (FERGON) 324 MG tablet Take 1 tablet (324 mg total) by mouth 2 (two) times daily with a meal. 07/15/15  Yes Ripudeep K Rai, MD  Fluticasone-Salmeterol (ADVAIR) 100-50 MCG/DOSE AEPB Inhale 1 puff into the lungs 2 (two) times daily as needed (shortness of breath).   Yes Historical Provider, MD  isosorbide mononitrate (IMDUR) 60 MG 24 hr tablet Take 1.5 tablets (90 mg total)  by mouth daily. 08/08/15  Yes Brett Canales, PA-C  loratadine (CLARITIN) 10 MG tablet Take 10 mg by mouth daily as needed for allergies.   Yes Historical Provider, MD  metoprolol succinate (TOPROL-XL) 100 MG 24 hr tablet Take 150 mg by mouth daily. Take with or immediately following a meal.   Yes Historical Provider, MD  nitroGLYCERIN (NITROSTAT) 0.4 MG SL tablet Place 1 tablet (0.4 mg total) under the tongue every 5 (five) minutes as needed for chest pain. 12/22/12  Yes Leonie Man, MD  pantoprazole (PROTONIX) 40 MG tablet Take 1 tablet (40 mg total) by mouth daily. 09/14/13  Yes Shanker Kristeen Mans, MD   No Known Allergies   Social History   Social History  . Marital Status: Married    Spouse Name: N/A  . Number of Children: 1  . Years of Education: N/A   Occupational History  .     Social History Main Topics  . Smoking status: Former Smoker    Types: Cigarettes    Quit date: 05/04/2013  . Smokeless tobacco: Never Used  . Alcohol Use: Yes  Comment: occasionally 3 times a year  . Drug Use: No  . Sexual Activity: No   Other Topics Concern  . None   Social History Narrative   Married. Has one child. Grandmother for a great-grandmother and 1.   No real exercise.   Denies alcohol consumption.   Quit smoking inf Feb 2015 -- after L Iliac Stent    Family History  Problem Relation Age of Onset  . Diabetes Mother   . Heart disease Mother   . Heart attack Mother 64  . Breast cancer Mother 49  . Colon cancer Mother 27  . Diabetes Sister   . Heart attack Father 75  . Breast cancer Sister 14    Wt Readings from Last 3 Encounters:  09/01/15 137 lb 12.8 oz (62.506 kg)  08/01/15 139 lb (63.05 kg)  07/14/15 139 lb 1.8 oz (63.1 kg)    PHYSICAL EXAM BP 130/70 mmHg  Pulse 70  Ht 4\' 11"  (1.499 m)  Wt 137 lb 12.8 oz (62.506 kg)  BMI 27.82 kg/m2  SpO2 99% General appearance: alert, cooperative, appears stated age, no distress and Borderline obese HEENT: [place]/AT, EOMI, MMM,  anicteric sclera Neck: no adenopathy, R >L carotid bruit and no JVD Lungs: clear to auscultation bilaterally, normal percussion bilaterally and non-labored Heart: regular rate and rhythm, S1, S2 normal, no murmur, click, rub or gallop; nondisplaced PMI Abdomen: soft, non-tender; bowel sounds normal; no masses, no organomegaly;  Extremities: extremities normal, atraumatic, no cyanosis, and edema is minimal Pulses: 2+ and symmetric; Skin: mobility and turgor normal  Neurologic: Mental status: Alert, oriented, thought content appropriate Cranial nerves: normal (II-XII grossly intact)   Adult ECG Report Not checked  Other studies Reviewed: Additional studies/ records that were reviewed today include:  Recent Labs:    -- Most recent hemoglobin was 8.8   ASSESSMENT / PLAN: Problem List Items Addressed This Visit    PAD (peripheral artery disease) - bilateral CIA & CFA 50-69%; Claudication (Chronic)   Relevant Orders   Myocardial Perfusion Imaging   CBC (Completed)   ECHOCARDIOGRAM COMPLETE   Iron deficiency anemia    From GI bleed. No longer on aspirin or Plavix. Has capsule endoscopy pending. Check CBC to ensure that her hemoglobin is not dropping having been on iron.  Would try to avoid invasive treatment with PCI until we can be assured that her bleeding has stopped.      Essential hypertension, benign (Chronic)   Relevant Orders   Myocardial Perfusion Imaging   CBC (Completed)   ECHOCARDIOGRAM COMPLETE   Dyslipidemia, goal LDL below 70 (Chronic)    On stable dose of Lipitor. Labs are followed by PCP.      Relevant Orders   Myocardial Perfusion Imaging   CBC (Completed)   ECHOCARDIOGRAM COMPLETE   DOE (dyspnea on exertion)    With the anemia and chest discomfort episodes, I'm concerned that some of her symptoms may relate to potentially heart failure. He has not had an evaluation of her cardiac function and valvular function in some time. Plan: 2-D echocardiogram and  Myoview stress test      Relevant Orders   Myocardial Perfusion Imaging   CBC (Completed)   ECHOCARDIOGRAM COMPLETE   Chest pain with moderate risk for cardiac etiology - Primary    She still having these strange chest discomfort symptoms. It's not clear to me that these are significant for a cardiac standpoint. However when I had seen her previously she was clearly having symptoms that  sound like last for angina. This seems to have improved since her transfusion, and may have been related to anemia.  As it stands, with her recent GI bleed she is currently off of aspirin and Plavix. This would potentially preclude any stents until this is resolved. As result, I'm reluctant to do anything invasive to evaluate her discomfort until she can be back on aspirin and Plavix.  Plan for now will be to check a Lexiscan Myoview to assess if there is indeed significant ischemia that would warrant catheterization.      Relevant Orders   Myocardial Perfusion Imaging   CBC (Completed)   ECHOCARDIOGRAM COMPLETE   CAD S/P percutaneous coronary angioplasty -- Promus DES 2.5 mm x 20 mm postdilated to 3 mm (Chronic)    I was very concerned about recurrent anginal symptoms when I last saw her and plan catheterization. She was then found to have significant anemia requiring transfer lesion. Now she seems to be doing better. Plan for now would be to do noninvasive evaluation in order to steer [place] the right direction. She is on stable dose of beta blocker, amlodipine and Imdur along with statin.       Relevant Orders   Myocardial Perfusion Imaging   CBC (Completed)   ECHOCARDIOGRAM COMPLETE      Current medicines are reviewed at length with the patient today. (+/- concerns) None The following changes have been made: None Studies Ordered:   Orders Placed This Encounter  Procedures  . CBC  . Myocardial Perfusion Imaging  . ECHOCARDIOGRAM COMPLETE   Follow-up - July 2017 WITH DR [facility].   [name], M.D., M.S. Interventional Cardiologist   Pager # [phone] Phone # [phone] [address] [place], [place] [address]

## 2015-09-03 ENCOUNTER — Encounter: Payer: Self-pay | Admitting: Cardiology

## 2015-09-03 DIAGNOSIS — R0609 Other forms of dyspnea: Secondary | ICD-10-CM | POA: Insufficient documentation

## 2015-09-03 DIAGNOSIS — R0600 Dyspnea, unspecified: Secondary | ICD-10-CM | POA: Insufficient documentation

## 2015-09-03 DIAGNOSIS — R079 Chest pain, unspecified: Secondary | ICD-10-CM | POA: Insufficient documentation

## 2015-09-03 NOTE — Assessment & Plan Note (Addendum)
She still having these strange chest discomfort symptoms. It's not clear to me that these are significant for a cardiac standpoint. However when I had seen her previously she was clearly having symptoms that sound like last for angina. This seems to have improved since her transfusion, and may have been related to anemia. - She is on beta blocker and calcium channel blocker and Imdur.  As it stands, with her recent GI bleed she is currently off of aspirin and Plavix. This would potentially preclude any stents until this is resolved. As result, I'm reluctant to do anything invasive to evaluate her discomfort until she can be back on aspirin and Plavix.  Plan for now will be to check a Lexiscan Myoview to assess if there is indeed significant ischemia that would warrant catheterization.

## 2015-09-03 NOTE — Assessment & Plan Note (Signed)
On stable dose of Lipitor. Labs are followed by PCP.

## 2015-09-03 NOTE — Assessment & Plan Note (Signed)
From GI bleed. No longer on aspirin or Plavix. Has capsule endoscopy pending. Check CBC to ensure that her hemoglobin is not dropping having been on iron.  Would try to avoid invasive treatment with PCI until we can be assured that her bleeding has stopped.

## 2015-09-03 NOTE — Assessment & Plan Note (Signed)
I was very concerned about recurrent anginal symptoms when I last saw her and plan catheterization. She was then found to have significant anemia requiring transfer lesion. Now she seems to be doing better. Plan for now would be to do noninvasive evaluation in order to steer [place] the right direction. She is on stable dose of beta blocker, amlodipine and Imdur along with statin.

## 2015-09-03 NOTE — Assessment & Plan Note (Signed)
With the anemia and chest discomfort episodes, I'm concerned that some of her symptoms may relate to potentially heart failure. He has not had an evaluation of her cardiac function and valvular function in some time. Plan: 2-D echocardiogram and Myoview stress test

## 2015-09-06 ENCOUNTER — Telehealth (HOSPITAL_COMMUNITY): Payer: Self-pay

## 2015-09-06 NOTE — Telephone Encounter (Signed)
Encounter complete. 

## 2015-09-07 ENCOUNTER — Ambulatory Visit (HOSPITAL_COMMUNITY)
Admission: RE | Admit: 2015-09-07 | Discharge: 2015-09-07 | Disposition: A | Discharge status: Home / Self Care | Payer: Medicare Other | Source: Ambulatory Visit | Attending: Cardiovascular Disease | Admitting: Cardiovascular Disease

## 2015-09-07 DIAGNOSIS — I10 Essential (primary) hypertension: Secondary | ICD-10-CM | POA: Diagnosis not present

## 2015-09-07 DIAGNOSIS — E785 Hyperlipidemia, unspecified: Secondary | ICD-10-CM | POA: Diagnosis not present

## 2015-09-07 DIAGNOSIS — I779 Disorder of arteries and arterioles, unspecified: Secondary | ICD-10-CM | POA: Diagnosis not present

## 2015-09-07 DIAGNOSIS — E663 Overweight: Secondary | ICD-10-CM | POA: Insufficient documentation

## 2015-09-07 DIAGNOSIS — I2510 Atherosclerotic heart disease of native coronary artery without angina pectoris: Secondary | ICD-10-CM | POA: Diagnosis not present

## 2015-09-07 DIAGNOSIS — R079 Chest pain, unspecified: Secondary | ICD-10-CM | POA: Diagnosis not present

## 2015-09-07 DIAGNOSIS — Z6827 Body mass index (BMI) 27.0-27.9, adult: Secondary | ICD-10-CM | POA: Insufficient documentation

## 2015-09-07 DIAGNOSIS — I739 Peripheral vascular disease, unspecified: Secondary | ICD-10-CM | POA: Diagnosis not present

## 2015-09-07 DIAGNOSIS — Z8249 Family history of ischemic heart disease and other diseases of the circulatory system: Secondary | ICD-10-CM | POA: Insufficient documentation

## 2015-09-07 DIAGNOSIS — Z9861 Coronary angioplasty status: Secondary | ICD-10-CM | POA: Insufficient documentation

## 2015-09-07 DIAGNOSIS — Z87891 Personal history of nicotine dependence: Secondary | ICD-10-CM | POA: Insufficient documentation

## 2015-09-07 DIAGNOSIS — R0609 Other forms of dyspnea: Secondary | ICD-10-CM | POA: Diagnosis not present

## 2015-09-07 DIAGNOSIS — R42 Dizziness and giddiness: Secondary | ICD-10-CM | POA: Diagnosis not present

## 2015-09-07 HISTORY — PX: NM MYOVIEW LTD: HXRAD82

## 2015-09-07 LAB — MYOCARDIAL PERFUSION IMAGING
CHL CUP MPHR: 152 {beats}/min
CHL CUP NUCLEAR SDS: 2
CHL CUP RESTING HR STRESS: 71 {beats}/min
CHL RATE OF PERCEIVED EXERTION: 17
CSEPED: 4 min
CSEPEDS: 53 s
Estimated workload: 6.8 METS
LV sys vol: 20 mL
LVDIAVOL: 61 mL (ref 46–106)
Peak HR: 131 {beats}/min
Percent HR: 86 %
SRS: 0
SSS: 2
TID: 1.13

## 2015-09-07 MED ORDER — TECHNETIUM TC 99M TETROFOSMIN IV KIT
31.5000 | PACK | Freq: Once | INTRAVENOUS | Status: AC | PRN
  Administered 2015-09-07: 31.5 via INTRAVENOUS
  Filled 2015-09-07: qty 32

## 2015-09-07 MED ORDER — TECHNETIUM TC 99M TETROFOSMIN IV KIT
10.6000 | PACK | Freq: Once | INTRAVENOUS | Status: AC | PRN
  Administered 2015-09-07: 10.6 via INTRAVENOUS
  Filled 2015-09-07: qty 11

## 2015-09-21 ENCOUNTER — Ambulatory Visit (HOSPITAL_COMMUNITY): Payer: Medicare Other | Attending: Cardiovascular Disease

## 2015-09-21 ENCOUNTER — Other Ambulatory Visit: Payer: Self-pay

## 2015-09-21 DIAGNOSIS — R079 Chest pain, unspecified: Secondary | ICD-10-CM | POA: Diagnosis not present

## 2015-09-21 DIAGNOSIS — I739 Peripheral vascular disease, unspecified: Secondary | ICD-10-CM | POA: Insufficient documentation

## 2015-09-21 DIAGNOSIS — I2510 Atherosclerotic heart disease of native coronary artery without angina pectoris: Secondary | ICD-10-CM | POA: Insufficient documentation

## 2015-09-21 DIAGNOSIS — R0609 Other forms of dyspnea: Secondary | ICD-10-CM | POA: Insufficient documentation

## 2015-09-21 DIAGNOSIS — I129 Hypertensive chronic kidney disease with stage 1 through stage 4 chronic kidney disease, or unspecified chronic kidney disease: Secondary | ICD-10-CM | POA: Insufficient documentation

## 2015-09-21 DIAGNOSIS — I351 Nonrheumatic aortic (valve) insufficiency: Secondary | ICD-10-CM | POA: Insufficient documentation

## 2015-09-21 DIAGNOSIS — I059 Rheumatic mitral valve disease, unspecified: Secondary | ICD-10-CM | POA: Insufficient documentation

## 2015-09-21 DIAGNOSIS — Z9861 Coronary angioplasty status: Secondary | ICD-10-CM | POA: Diagnosis not present

## 2015-09-21 DIAGNOSIS — N182 Chronic kidney disease, stage 2 (mild): Secondary | ICD-10-CM | POA: Insufficient documentation

## 2015-09-21 DIAGNOSIS — E785 Hyperlipidemia, unspecified: Secondary | ICD-10-CM | POA: Diagnosis not present

## 2015-09-21 DIAGNOSIS — Z87891 Personal history of nicotine dependence: Secondary | ICD-10-CM | POA: Insufficient documentation

## 2015-09-21 DIAGNOSIS — I10 Essential (primary) hypertension: Secondary | ICD-10-CM

## 2015-09-21 HISTORY — PX: TRANSTHORACIC ECHOCARDIOGRAM: SHX275

## 2015-09-21 LAB — ECHOCARDIOGRAM COMPLETE
AVPHT: 370 ms
CHL CUP MV DEC (S): 197
E/e' ratio: 13.46
EWDT: 197 ms
FS: 37 % (ref 28–44)
IV/PV OW: 1.15
LA ID, A-P, ES: 28 mm
LA vol index: 19.8 mL/m2
LA vol: 32.2 mL
LADIAMINDEX: 1.72 cm/m2
LAVOLA4C: 38.4 mL
LEFT ATRIUM END SYS DIAM: 28 mm
LV PW d: 7.79 mm — AB (ref 0.6–1.1)
LVEEAVG: 13.46
LVEEMED: 13.46
LVELAT: 7.8 cm/s
LVOT VTI: 26 cm
LVOT area: 2.84 cm2
LVOT diameter: 19 mm
LVOT peak grad rest: 6 mmHg
LVOTPV: 123 cm/s
LVOTSV: 74 mL
MV Peak grad: 4 mmHg
MV pk A vel: 113 m/s
MVPKEVEL: 105 m/s
RV LATERAL S' VELOCITY: 13.4 cm/s
TAPSE: 15.7 mm
TDI e' lateral: 7.8
TDI e' medial: 6.3

## 2015-09-22 NOTE — Progress Notes (Signed)
Quick Note:  Echo results: Good news: Essentially normal echocardiogram and normal pump function and normal valve function.  EF: 60-65 %. Only grade 1 diastolic dysfunction. Insetting of significant hypertension, this could contribute to dyspnea. - Goal in treatment there is reducing blood pressure. No regional wall motion abnormalities = No signs to suggest heart attack.[name], MD  Please forward [phone] Chapman Fitch, MD ______

## 2015-11-03 ENCOUNTER — Encounter: Payer: Self-pay | Admitting: Cardiology

## 2015-11-03 ENCOUNTER — Ambulatory Visit (INDEPENDENT_AMBULATORY_CARE_PROVIDER_SITE_OTHER): Payer: Medicare Other | Admitting: Cardiology

## 2015-11-03 VITALS — BP 122/67 | HR 88 | Ht 59.0 in | Wt 142.6 lb

## 2015-11-03 DIAGNOSIS — I10 Essential (primary) hypertension: Secondary | ICD-10-CM | POA: Diagnosis not present

## 2015-11-03 DIAGNOSIS — I739 Peripheral vascular disease, unspecified: Secondary | ICD-10-CM | POA: Diagnosis not present

## 2015-11-03 DIAGNOSIS — E785 Hyperlipidemia, unspecified: Secondary | ICD-10-CM

## 2015-11-03 DIAGNOSIS — I2510 Atherosclerotic heart disease of native coronary artery without angina pectoris: Secondary | ICD-10-CM

## 2015-11-03 DIAGNOSIS — E669 Obesity, unspecified: Secondary | ICD-10-CM

## 2015-11-03 DIAGNOSIS — R079 Chest pain, unspecified: Secondary | ICD-10-CM

## 2015-11-03 DIAGNOSIS — Z9861 Coronary angioplasty status: Secondary | ICD-10-CM

## 2015-11-03 NOTE — Progress Notes (Signed)
PCP: Antony Blackbird, MD  Clinic Note: Chief Complaint  Patient presents with  . Follow-up    July 28th patient experience sob, chest pulling and tightness. along with tightness in the neck . lightheaded and dizziness, with a lost of balance.  Edema in ankles, no other Sx sense epsiode occured.  . Coronary Artery Disease    Reevaluation of chest pain    HPI: Kayla Mcclure is a 70 y.o. female with a PMH below who presents today for 1 month follow-up of CAD, angina and anemia.  Kayla Mcclure was last seen on May 1 by Tarri Fuller, PA-C. -- cramping in her legs and feet. When I saw her back in April she was noticing progressively worsening episodes of exertional chest pain radiating to the jaw, relieved with rest. The plan was for heart catheterization, but this was postponed due to anemia and requiring transfusion. She had an EGD that showed possible bleeding AVMs and asepirin/Plavix wre discontinued. The plan was next to do a capsule endoscopy. When she saw Gaspar Bidding she had noted another episode of severe left-sided chest pain that resolved prior to taking nitroglycerin. Is very short sharp pain episodes on the left wrist since.  Recent Hospitalizations: April 13-14, 2017 admitted for GI bleed secondary to gastric and duodenal angiectasia..  This hospitalization was in lieu of her coming in for catheterization. She was transfused. Aspirin and Plavix were stopped.We therefore decided to check a Myoview stress test to evaluate for potential ischemia instead of cardiac catheterization.  Studies Reviewed: -- Myoview and echo updated in [facility] Carotid Dopplers and lower extremity arterial Dopplers - per Dr. Gwenlyn Found, both are stable with plans for annual follow-up  Myoview 6/7  The left ventricular ejection fraction is hyperdynamic (>65%).  Nuclear stress EF: 67%.  There was no ST segment deviation noted during stress.  The study is normal.  This is a low risk study.    Echo 09/21/15: EF:  60-65 %.  Gr 1 DD. No regional wall motion abnormalities, normal valves  Interval History: Kayla Mcclure presents today Doing fairly well. She had one episode of sensation of pulling and tightness in her chest and neck back on July 28. This is associated with some dyspnea. But since then she really has not had any further symptoms. Nothing that was like her symptoms from April timeframe. She does have a little bit unsteady bat balance and gait has had some lightheadedness and dizziness but nothing suggestive of syncope or near syncope. This is all associated with a single episode but it only lasted maybe a minute.. Occasionally she has some pain in her back that radiates to the front, but it doesn't feel similar to her angina. This one episode was a little bit concerning for her, and she just felt like she was hot and flushed, also little bit dazed during this episode. She was concerned about potentially being a TIA. But she is not had any further episodes.   She denies any significant rapid irregular heartbeats /palpitations.. She is not having heart failure symptoms of PND, orthopnea. She does have some mild ankle edema. She really doesn't describe having the chest discomfort with exertion either. She does however note some exertional dyspnea.   Otherwise she has some dizziness, she denied any syncope/near syncope, or amaurosis fugax symptoms. No melena, hematochezia, hematuria, or epstaxis. No claudication.  She supposedly had a capsule endoscopy done by GI medicine. Continues to be off of aspirin and Plavix until this is has been  reviewed with her. I've asked her to ask GI if she can restart at least aspirin if not Plavix. I don't need both.  ROS: A comprehensive was performed. Review of Systems  Constitutional: Negative for malaise/fatigue.  HENT: Negative for congestion and nosebleeds.   Eyes: Negative.   Respiratory: Negative for cough, shortness of breath and wheezing.   Cardiovascular:        Per history of present illness  Gastrointestinal: Negative for blood in stool, heartburn, melena and vomiting.  Genitourinary: Negative for hematuria.  Musculoskeletal: Positive for joint pain. Negative for falls and myalgias.  Neurological: Positive for dizziness and headaches. Negative for weakness.  Endo/Heme/Allergies: Does not bruise/bleed easily.  Psychiatric/Behavioral: Negative for depression and memory loss. The patient is nervous/anxious (Happy to hear about the results the stress test, still anxious about this recent episode.). The patient does not have insomnia.   All other systems reviewed and are negative.   Past Medical History:  Diagnosis Date  . Asthma  10/31/2007  . CAD (coronary artery disease) 08/05/2007   a) NSTEMI 2009: PCI to the RCA Promus DES 2.5 mm x 23 mm (3.0 mm). b) Myoview June 2017: Nonspecific ST changes. EF greater than 65%. LOW RISK, normal study. No ischemia or infarction.  . Carotid artery disease ([place])    a. 50-69% RICA by outpt dopp 04/22/13, f/u due 04/2014.  Marland Kitchen Chronic back pain   . CKD (chronic kidney disease), stage II   . Diverticulosis   04/11/2010    Colonoscopy  . Dyslipidemia, goal LDL below 70   . Essential hypertension    a. Normal renal arteries by PV angio 05/2013.  Marland Kitchen GERD (gastroesophageal reflux disease)   . Insomnia   . Internal hemorrhoids  04/11/2010   By colonoscopy  . Iron deficiency anemia   . Peripheral arterial disease ([place])    a. s/p PTA and stenting of L CIA stenosis 05/2013.  Marland Kitchen Renal insufficiency   . Tobacco abuse    Quit in 05/2013    Past Surgical History:  Procedure Laterality Date  . CHOLECYSTECTOMY  1975  . COLONOSCOPY  04/11/2010  . CORONARY ANGIOPLASTY WITH STENT PLACEMENT  08/05/2007   Promus DES 2.5 mm x 23 mm (3 mm)  to RCA; EF 60-70%  . ESOPHAGOGASTRODUODENOSCOPY N/A 09/13/2013   Procedure: ESOPHAGOGASTRODUODENOSCOPY (EGD);  Surgeon: Lear Ng, MD;  Location: [facility] ENDOSCOPY;  Service: Endoscopy;   Laterality: N/A;  . ESOPHAGOGASTRODUODENOSCOPY N/A 07/14/2015   Procedure: ESOPHAGOGASTRODUODENOSCOPY (EGD);  Surgeon: Clarene Essex, MD;  Location: [facility] ENDOSCOPY;  Service: Endoscopy;  Laterality: N/A;  . ILIAC ARTERY STENT Left 05/04/2013   L CIA-EIA -- Dr. Gwenlyn Found  . LOWER EXTREMITY ANGIOGRAM Bilateral 05/04/2013   Procedure: LOWER EXTREMITY ANGIOGRAM;  Surgeon: Lorretta Harp, MD;  Location: [facility] CATH LAB;  Service: Cardiovascular;  Laterality: Bilateral;  . NM MYOVIEW LTD  09/07/2015   EF > 65%. Nonspecific ST changes but no ischemic changes. No ischemia or infarction. LOW RISK  . TONSILLECTOMY  1960's  . TRANSTHORACIC ECHOCARDIOGRAM  09/21/2015   EF 60 deficits 5%. GR 1 DD. No regional wall motion analysis. No valve disease.    Prior to Admission medications   Medication Sig Start Date End Date Taking? Authorizing Provider  albuterol (PROVENTIL HFA;VENTOLIN HFA) 108 (90 BASE) MCG/ACT inhaler Inhale 2 puffs into the lungs every 4 (four) hours as needed. For shortness of breath   Yes Historical Provider, MD  amLODipine (NORVASC) 5 MG tablet Take 5 mg by mouth daily.  Yes Historical Provider, MD  atorvastatin (LIPITOR) 40 MG tablet Take 40 mg by mouth daily.   Yes Historical Provider, MD  ferrous gluconate (FERGON) 324 MG tablet Take 1 tablet (324 mg total) by mouth 2 (two) times daily with a meal. 07/15/15  Yes Ripudeep K Rai, MD  Fluticasone-Salmeterol (ADVAIR) 100-50 MCG/DOSE AEPB Inhale 1 puff into the lungs 2 (two) times daily as needed (shortness of breath).   Yes Historical Provider, MD  isosorbide mononitrate (IMDUR) 60 MG 24 hr tablet Take 1.5 tablets (90 mg total) by mouth daily. 08/08/15  Yes Brett Canales, PA-C  loratadine (CLARITIN) 10 MG tablet Take 10 mg by mouth daily as needed for allergies.   Yes Historical Provider, MD  metoprolol succinate (TOPROL-XL) 100 MG 24 hr tablet Take 150 mg by mouth daily. Take with or immediately following a meal.   Yes Historical Provider, MD    nitroGLYCERIN (NITROSTAT) 0.4 MG SL tablet Place 1 tablet (0.4 mg total) under the tongue every 5 (five) minutes as needed for chest pain. 12/22/12  Yes Leonie Man, MD  pantoprazole (PROTONIX) 40 MG tablet Take 1 tablet (40 mg total) by mouth daily. 09/14/13  Yes Shanker Kristeen Mans, MD   No Known Allergies   Social History   Social History  . Marital status: Married    Spouse name: N/A  . Number of children: 1  . Years of education: N/A   Occupational History  .  Retired   Social History Main Topics  . Smoking status: Former Smoker    Types: Cigarettes    Quit date: 05/04/2013  . Smokeless tobacco: Never Used  . Alcohol use Yes     Comment: occasionally 3 times a year  . Drug use: No  . Sexual activity: No   Other Topics Concern  . None   Social History Narrative   Married. Has one child. Grandmother for a great-grandmother and 1.   No real exercise.   Denies alcohol consumption.   Quit smoking inf Feb 2015 -- after L Iliac Stent    Family History  Problem Relation Age of Onset  . Diabetes Mother   . Heart disease Mother   . Heart attack Mother 12  . Breast cancer Mother 90  . Colon cancer Mother 72  . Heart attack Father 49  . Diabetes Sister   . Breast cancer Sister 77    Wt Readings from Last 3 Encounters:  11/03/15 142 lb 9.6 oz (64.7 kg)  09/07/15 137 lb (62.1 kg)  09/01/15 137 lb 12.8 oz (62.5 kg)    PHYSICAL EXAM BP 122/67   Pulse 88   Ht 4\' 11"  (1.499 m)   Wt 142 lb 9.6 oz (64.7 kg)   BMI 28.80 kg/m  General appearance: alert, cooperative, appears stated age, no distress and Borderline obese HEENT: [place]/AT, EOMI, MMM, anicteric sclera Neck: no adenopathy, R >L carotid bruit and no JVD Lungs: clear to auscultation bilaterally, normal percussion bilaterally and non-labored Heart: regular rate and rhythm, S1, S2 normal, no murmur, click, rub or gallop; nondisplaced PMI Abdomen: soft, non-tender; bowel sounds normal; no masses, no organomegaly;   Extremities: extremities normal, atraumatic, no cyanosis, and edema is minimal Pulses: 2+ and symmetric; Skin: mobility and turgor normal  Neurologic: Mental status: Alert, oriented, thought content appropriate Cranial nerves: normal (II-XII grossly intact)   Adult ECG Report Not checked  Other studies Reviewed: Additional studies/ records that were reviewed today include:  Recent Labs:    --  Most recent hemoglobin was 11.3    ASSESSMENT / PLAN:  Her unusual episode several days ago did not sound like a classic anginal episode, but there was concern for possible TIA from her, and I think is not unreasonable. I talked about it with her in detail and suggested that she should go to the hospital of these episodes recur. This is another reason that I would like for her to discuss hopefully getting her back on either aspirin and/or Plavix after discussing with her GI doctor.  Problem List Items Addressed This Visit    PAD (peripheral artery disease) - bilateral CIA & CFA 50-69%; Claudication (Chronic)    Followed by Dr. Gwenlyn Found. Recent Dopplers are stable.      Obesity (BMI 30-39.9) (Chronic)    The patient understands the need to lose weight with diet and exercise. We have discussed specific strategies for this.      Essential hypertension, benign (Chronic)    Overall, much better today. We plan to increase her amlodipine to 10 mg, but she never did. Otherwise she is on her high dose Toprol and Imdur. Not on ACE inhibitor/ARB, as I chose to use amlodipine for antianginal effect.      Dyslipidemia, goal LDL below 70 (Chronic)    On stable dose of statin. Monitored by PCP.      Chest pain with low risk for cardiac etiology    Thankfully, her Myoview is pretty much nonischemic I wonder if the discomfort she was having back in April was simply related to demand ischemia from anemia.  She had at one other episode which is still concerning. Continue to follow those symptoms. If she  still has symptoms in the future, would potentially consider skipping stress testing or cardiac catheterization. Continue Imdur for now.      CAD S/P percutaneous coronary angioplasty -- Promus DES 2.5 mm x 20 mm postdilated to 3 mm (Chronic)    A little less concerned about anginal symptoms now. I had planned cardiac catheterization in the past, but symptoms have significantly improved after transfusion. Her hemoglobin is now stable. She is on beta blocker, amlodipine and Imdur along with statin. However she is not on aspirin and Plavix now. I would like for her to at least be on aspirin if not Plavix. I think Plavix may be preferable. Vascular her to review this with her Neurologist When She Sees an Shortly. Hopefully We Can Reinitiate Antiplatelet Therapy.       Other Visit Diagnoses    Coronary artery disease involving native coronary artery of native heart without angina pectoris    -  Primary   Relevant Orders   EKG 12-Lead (Completed)      Current medicines are reviewed at length with the patient today. (+/- concerns) None The following changes have been made: None Studies Ordered:   Orders Placed This Encounter  Procedures  . EKG 12-Lead   Follow-up - 6 months WITH DR HARDING.  DISCUSS WITH YOUR GI DOCTOR ABOUT STARTING ASPIRIN OR PLAVIX FOR POSSIBLE TIA SYMPTOMS   [name], M.D., M.S. Interventional Cardiologist   Pager # [phone] Phone # [phone] [address] [place], [place] [address]

## 2015-11-03 NOTE — Patient Instructions (Signed)
Your physician wants you to follow-up in: [place] will receive a reminder letter in the mail two months in advance. If you don't receive a letter, please call our office to schedule the follow-up appointment.   DISCUSS WITH YOUR GI DOCTOR ABOUT STARTING ASPIRIN OR PLAVIX FOR POSSIBLE TIA SYMPTOMS

## 2015-11-05 ENCOUNTER — Encounter: Payer: Self-pay | Admitting: Cardiology

## 2015-11-05 NOTE — Assessment & Plan Note (Signed)
Overall, much better today. We plan to increase her amlodipine to 10 mg, but she never did. Otherwise she is on her high dose Toprol and Imdur. Not on ACE inhibitor/ARB, as I chose to use amlodipine for antianginal effect.

## 2015-11-05 NOTE — Assessment & Plan Note (Signed)
Thankfully, her Myoview is pretty much nonischemic I wonder if the discomfort she was having back in April was simply related to demand ischemia from anemia.  She had at one other episode which is still concerning. Continue to follow those symptoms. If she still has symptoms in the future, would potentially consider skipping stress testing or cardiac catheterization. Continue Imdur for now.

## 2015-11-05 NOTE — Assessment & Plan Note (Signed)
A little less concerned about anginal symptoms now. I had planned cardiac catheterization in the past, but symptoms have significantly improved after transfusion. Her hemoglobin is now stable. She is on beta blocker, amlodipine and Imdur along with statin. However she is not on aspirin and Plavix now. I would like for her to at least be on aspirin if not Plavix. I think Plavix may be preferable. Vascular her to review this with her Neurologist When She Sees an Shortly. Hopefully We Can Reinitiate Antiplatelet Therapy.

## 2015-11-05 NOTE — Assessment & Plan Note (Signed)
On stable dose of statin. Monitored by PCP.

## 2015-11-05 NOTE — Assessment & Plan Note (Signed)
The patient understands the need to lose weight with diet and exercise. We have discussed specific strategies for this.  

## 2015-11-05 NOTE — Assessment & Plan Note (Signed)
Followed by Dr. Gwenlyn Found. Recent Dopplers are stable.

## 2015-11-30 HISTORY — PX: OTHER SURGICAL HISTORY: SHX169

## 2015-11-30 HISTORY — PX: COLONOSCOPY: SHX174

## 2015-12-02 DIAGNOSIS — K922 Gastrointestinal hemorrhage, unspecified: Secondary | ICD-10-CM

## 2015-12-02 DIAGNOSIS — Z9289 Personal history of other medical treatment: Secondary | ICD-10-CM

## 2015-12-02 HISTORY — DX: Gastrointestinal hemorrhage, unspecified: K92.2

## 2015-12-02 HISTORY — DX: Personal history of other medical treatment: Z92.89

## 2015-12-14 HISTORY — PX: UPPER GI ENDOSCOPY: SHX6162

## 2015-12-19 ENCOUNTER — Inpatient Hospital Stay (HOSPITAL_COMMUNITY)
Admission: EM | Admit: 2015-12-19 | Discharge: 2015-12-21 | DRG: 378 | Disposition: A | Discharge status: Home / Self Care | Payer: Medicare Other | Attending: Internal Medicine | Admitting: Internal Medicine

## 2015-12-19 ENCOUNTER — Encounter (HOSPITAL_COMMUNITY): Payer: Self-pay

## 2015-12-19 DIAGNOSIS — Z79899 Other long term (current) drug therapy: Secondary | ICD-10-CM | POA: Diagnosis not present

## 2015-12-19 DIAGNOSIS — N183 Chronic kidney disease, stage 3 (moderate): Secondary | ICD-10-CM | POA: Diagnosis present

## 2015-12-19 DIAGNOSIS — I252 Old myocardial infarction: Secondary | ICD-10-CM

## 2015-12-19 DIAGNOSIS — D509 Iron deficiency anemia, unspecified: Secondary | ICD-10-CM | POA: Diagnosis present

## 2015-12-19 DIAGNOSIS — Z833 Family history of diabetes mellitus: Secondary | ICD-10-CM | POA: Diagnosis not present

## 2015-12-19 DIAGNOSIS — D649 Anemia, unspecified: Secondary | ICD-10-CM | POA: Diagnosis present

## 2015-12-19 DIAGNOSIS — I129 Hypertensive chronic kidney disease with stage 1 through stage 4 chronic kidney disease, or unspecified chronic kidney disease: Secondary | ICD-10-CM | POA: Diagnosis present

## 2015-12-19 DIAGNOSIS — Z955 Presence of coronary angioplasty implant and graft: Secondary | ICD-10-CM

## 2015-12-19 DIAGNOSIS — M549 Dorsalgia, unspecified: Secondary | ICD-10-CM | POA: Diagnosis present

## 2015-12-19 DIAGNOSIS — J4520 Mild intermittent asthma, uncomplicated: Secondary | ICD-10-CM | POA: Diagnosis not present

## 2015-12-19 DIAGNOSIS — D62 Acute posthemorrhagic anemia: Secondary | ICD-10-CM | POA: Diagnosis present

## 2015-12-19 DIAGNOSIS — K2901 Acute gastritis with bleeding: Secondary | ICD-10-CM | POA: Diagnosis not present

## 2015-12-19 DIAGNOSIS — K922 Gastrointestinal hemorrhage, unspecified: Secondary | ICD-10-CM | POA: Diagnosis present

## 2015-12-19 DIAGNOSIS — J45909 Unspecified asthma, uncomplicated: Secondary | ICD-10-CM | POA: Diagnosis present

## 2015-12-19 DIAGNOSIS — E785 Hyperlipidemia, unspecified: Secondary | ICD-10-CM | POA: Diagnosis present

## 2015-12-19 DIAGNOSIS — I2510 Atherosclerotic heart disease of native coronary artery without angina pectoris: Secondary | ICD-10-CM | POA: Diagnosis present

## 2015-12-19 DIAGNOSIS — K5520 Angiodysplasia of colon without hemorrhage: Secondary | ICD-10-CM | POA: Diagnosis present

## 2015-12-19 DIAGNOSIS — Z87891 Personal history of nicotine dependence: Secondary | ICD-10-CM | POA: Diagnosis not present

## 2015-12-19 DIAGNOSIS — Z6828 Body mass index (BMI) 28.0-28.9, adult: Secondary | ICD-10-CM

## 2015-12-19 DIAGNOSIS — I739 Peripheral vascular disease, unspecified: Secondary | ICD-10-CM

## 2015-12-19 DIAGNOSIS — Z8249 Family history of ischemic heart disease and other diseases of the circulatory system: Secondary | ICD-10-CM | POA: Diagnosis not present

## 2015-12-19 DIAGNOSIS — G8929 Other chronic pain: Secondary | ICD-10-CM | POA: Diagnosis present

## 2015-12-19 DIAGNOSIS — R0602 Shortness of breath: Secondary | ICD-10-CM | POA: Diagnosis present

## 2015-12-19 DIAGNOSIS — K219 Gastro-esophageal reflux disease without esophagitis: Secondary | ICD-10-CM | POA: Diagnosis present

## 2015-12-19 DIAGNOSIS — Z9049 Acquired absence of other specified parts of digestive tract: Secondary | ICD-10-CM | POA: Diagnosis not present

## 2015-12-19 DIAGNOSIS — Z7902 Long term (current) use of antithrombotics/antiplatelets: Secondary | ICD-10-CM

## 2015-12-19 DIAGNOSIS — Z9861 Coronary angioplasty status: Secondary | ICD-10-CM

## 2015-12-19 HISTORY — DX: Gastrointestinal hemorrhage, unspecified: K92.2

## 2015-12-19 HISTORY — DX: Acute myocardial infarction, unspecified: I21.9

## 2015-12-19 LAB — COMPREHENSIVE METABOLIC PANEL
ALBUMIN: 3.5 g/dL (ref 3.5–5.0)
ALK PHOS: 47 U/L (ref 38–126)
ALT: 10 U/L — AB (ref 14–54)
AST: 16 U/L (ref 15–41)
Anion gap: 7 (ref 5–15)
BILIRUBIN TOTAL: 0.6 mg/dL (ref 0.3–1.2)
BUN: 15 mg/dL (ref 6–20)
CALCIUM: 8.9 mg/dL (ref 8.9–10.3)
CO2: 21 mmol/L — AB (ref 22–32)
Chloride: 110 mmol/L (ref 101–111)
Creatinine, Ser: 1.29 mg/dL — ABNORMAL HIGH (ref 0.44–1.00)
GFR calc Af Amer: 48 mL/min — ABNORMAL LOW (ref >60)
GFR calc non Af Amer: 41 mL/min — ABNORMAL LOW (ref >60)
GLUCOSE: 99 mg/dL (ref 65–99)
Potassium: 4.5 mmol/L (ref 3.5–5.1)
SODIUM: 138 mmol/L (ref 135–145)
TOTAL PROTEIN: 6.9 g/dL (ref 6.5–8.1)

## 2015-12-19 LAB — RETICULOCYTES
RBC.: 1.87 MIL/uL — ABNORMAL LOW (ref 3.87–5.11)
Retic Count, Absolute: 228.1 10*3/uL — ABNORMAL HIGH (ref 19.0–186.0)
Retic Ct Pct: 12.2 % — ABNORMAL HIGH (ref 0.4–3.1)

## 2015-12-19 LAB — IRON AND TIBC
Iron: 16 ug/dL — ABNORMAL LOW (ref 28–170)
SATURATION RATIOS: 4 % — AB (ref 10.4–31.8)
TIBC: 413 ug/dL (ref 250–450)
UIBC: 397 ug/dL

## 2015-12-19 LAB — CBC
HCT: 19.4 % — ABNORMAL LOW (ref 36.0–46.0)
Hemoglobin: 5.4 g/dL — CL (ref 12.0–15.0)
MCH: 24.1 pg — AB (ref 26.0–34.0)
MCHC: 27.8 g/dL — AB (ref 30.0–36.0)
MCV: 86.6 fL (ref 78.0–100.0)
PLATELETS: 261 10*3/uL (ref 150–400)
RBC: 2.24 MIL/uL — AB (ref 3.87–5.11)
RDW: 21.3 % — ABNORMAL HIGH (ref 11.5–15.5)
WBC: 7.6 10*3/uL (ref 4.0–10.5)

## 2015-12-19 LAB — FERRITIN: Ferritin: 19 ng/mL (ref 11–307)

## 2015-12-19 LAB — PREPARE RBC (CROSSMATCH)

## 2015-12-19 LAB — FOLATE: FOLATE: 9.1 ng/mL (ref >5.9)

## 2015-12-19 LAB — VITAMIN B12: VITAMIN B 12: 230 pg/mL (ref 180–914)

## 2015-12-19 LAB — POC OCCULT BLOOD, ED: Fecal Occult Bld: POSITIVE — AB

## 2015-12-19 MED ORDER — PANTOPRAZOLE SODIUM 40 MG PO TBEC
40.0000 mg | DELAYED_RELEASE_TABLET | Freq: Every day | ORAL | Status: DC
  Administered 2015-12-19 – 2015-12-21 (×3): 40 mg via ORAL
  Filled 2015-12-19 (×3): qty 1

## 2015-12-19 MED ORDER — PROMETHAZINE HCL 25 MG PO TABS: 12.5000 mg | ORAL_TABLET | Freq: Four times a day (QID) | ORAL | Status: DC | PRN

## 2015-12-19 MED ORDER — MOMETASONE FURO-FORMOTEROL FUM 100-5 MCG/ACT IN AERO
2.0000 | INHALATION_SPRAY | Freq: Two times a day (BID) | RESPIRATORY_TRACT | Status: DC
  Administered 2015-12-20 – 2015-12-21 (×3): 2 via RESPIRATORY_TRACT
  Filled 2015-12-19: qty 8.8

## 2015-12-19 MED ORDER — SODIUM CHLORIDE 0.9 % IV SOLN
250.0000 mL | INTRAVENOUS | Status: DC | PRN
  Administered 2015-12-20: 250 mL via INTRAVENOUS

## 2015-12-19 MED ORDER — ACETAMINOPHEN 650 MG RE SUPP: 650.0000 mg | Freq: Four times a day (QID) | RECTAL | Status: DC | PRN

## 2015-12-19 MED ORDER — AMLODIPINE BESYLATE 5 MG PO TABS
5.0000 mg | ORAL_TABLET | Freq: Every day | ORAL | Status: DC
  Administered 2015-12-20 – 2015-12-21 (×2): 5 mg via ORAL
  Filled 2015-12-19 (×2): qty 1

## 2015-12-19 MED ORDER — CLOPIDOGREL BISULFATE 75 MG PO TABS
75.0000 mg | ORAL_TABLET | Freq: Every day | ORAL | Status: DC
  Filled 2015-12-19: qty 1

## 2015-12-19 MED ORDER — SODIUM CHLORIDE 0.9% FLUSH: 3.0000 mL | INTRAVENOUS | Status: DC | PRN

## 2015-12-19 MED ORDER — ISOSORBIDE MONONITRATE ER 60 MG PO TB24
90.0000 mg | ORAL_TABLET | Freq: Every day | ORAL | Status: DC
  Administered 2015-12-20 – 2015-12-21 (×2): 90 mg via ORAL
  Filled 2015-12-19 (×2): qty 1

## 2015-12-19 MED ORDER — ACETAMINOPHEN 325 MG PO TABS
650.0000 mg | ORAL_TABLET | Freq: Four times a day (QID) | ORAL | Status: DC | PRN
  Administered 2015-12-19 – 2015-12-21 (×2): 650 mg via ORAL
  Filled 2015-12-19 (×2): qty 2

## 2015-12-19 MED ORDER — SODIUM CHLORIDE 0.9 % IV SOLN
Freq: Once | INTRAVENOUS | Status: AC
  Administered 2015-12-19: 20:00:00 via INTRAVENOUS

## 2015-12-19 MED ORDER — FERROUS GLUCONATE 324 (38 FE) MG PO TABS
324.0000 mg | ORAL_TABLET | Freq: Three times a day (TID) | ORAL | Status: DC
  Administered 2015-12-19 – 2015-12-20 (×3): 324 mg via ORAL
  Filled 2015-12-19 (×7): qty 1

## 2015-12-19 MED ORDER — SODIUM CHLORIDE 0.9% FLUSH
3.0000 mL | Freq: Two times a day (BID) | INTRAVENOUS | Status: DC
  Administered 2015-12-20 (×2): 3 mL via INTRAVENOUS

## 2015-12-19 MED ORDER — NITROGLYCERIN 0.4 MG SL SUBL: 0.4000 mg | SUBLINGUAL_TABLET | SUBLINGUAL | Status: DC | PRN

## 2015-12-19 MED ORDER — SODIUM CHLORIDE 0.9% FLUSH
3.0000 mL | Freq: Two times a day (BID) | INTRAVENOUS | Status: DC
  Administered 2015-12-20: 3 mL via INTRAVENOUS

## 2015-12-19 MED ORDER — ATORVASTATIN CALCIUM 40 MG PO TABS
40.0000 mg | ORAL_TABLET | Freq: Every day | ORAL | Status: DC
  Administered 2015-12-19 – 2015-12-21 (×3): 40 mg via ORAL
  Filled 2015-12-19 (×4): qty 1

## 2015-12-19 MED ORDER — METOPROLOL SUCCINATE ER 50 MG PO TB24
150.0000 mg | ORAL_TABLET | Freq: Every day | ORAL | Status: DC
  Administered 2015-12-20 – 2015-12-21 (×2): 150 mg via ORAL
  Filled 2015-12-19 (×2): qty 1

## 2015-12-19 NOTE — ED Triage Notes (Signed)
Pt c/o SOB and dizziness since last Thursday pt noticed dark stools yesterday has a history of needing a blood transfusion due to a lower GI bleed

## 2015-12-19 NOTE — Progress Notes (Signed)
Pt arrived to unit via stretcher. VSS. Pt alert and oriented x4. Placed on tele with no issues. Husband at bedside. Waiting for blood bank to transfuse blood. Will report to night shift and continue to monitor pt closely. Leanne Chang, RN

## 2015-12-19 NOTE — ED Notes (Signed)
Attempted to call report to 6E. Phone number left for accepting nurse to call back

## 2015-12-19 NOTE — ED Notes (Signed)
PT signs blood administration consent at this time. This nurse witnessed Dr. Dayna Barker discuss risks and benefits of procedure with PT.

## 2015-12-19 NOTE — ED Notes (Signed)
Linda in blood bank confirms that they are still working on blood testing. PT taken an additional warm blanket.

## 2015-12-19 NOTE — ED Notes (Signed)
Accepting nurse is in a contact room and will call back when she exits.

## 2015-12-19 NOTE — ED Notes (Signed)
Lab calls to report that blood will be delayed due to additional testing. Dr. Dayna Barker made aware.

## 2015-12-19 NOTE — ED Notes (Signed)
Busy signal on 6E phone

## 2015-12-19 NOTE — H&P (Signed)
History and Physical    Kayla Mcclure LPF:[id number] DOB: [birth year] DOA: 12/19/2015  PCP: Antony Blackbird, MD Patient coming from: home  Chief Complaint: sob and dizziness.   HPI: [name] is a 70 y.o. female with medical history significant of asthma, CAD, CKD, HLD, HTN, GERD, anemia, PAD, presenting w/ 4 day h/o SOB, intermittent dizziness and dark stools. Dark stool started after patient had GI procedure 5 days ago. Patient initially stated this was an EGD however in discussing the case with patient's gastroenterologist, the equal [facility], it was noted that patient had a capsule endoscopy performed. Patient's symptoms were initially intermittent but are now constant and getting worse. Denies any LOC, vertigo, neck stiffness, headache, fever, chest pain, palpitations, diarrhea, hematochezia, bright red blood per rectum, hematemesis. Patient tried anything for her symptoms. Denies any NSAID use.    ED Course: objective findings outlined below.   Review of Systems: As per HPI otherwise 10 point review of systems negative.   Ambulatory Status:No restrictions  Past Medical History:  Diagnosis Date  . Asthma  10/31/2007  . CAD (coronary artery disease) 08/05/2007   a) NSTEMI 2009: PCI to the RCA Promus DES 2.5 mm x 23 mm (3.0 mm). b) Myoview June 2017: Nonspecific ST changes. EF greater than 65%. LOW RISK, normal study. No ischemia or infarction.  . Carotid artery disease ([place])    a. 50-69% RICA by outpt dopp 04/22/13, f/u due 04/2014.  Marland Kitchen Chronic back pain   . CKD (chronic kidney disease), stage II   . Diverticulosis   04/11/2010    Colonoscopy  . Dyslipidemia, goal LDL below 70   . Essential hypertension    a. Normal renal arteries by PV angio 05/2013.  Marland Kitchen GERD (gastroesophageal reflux disease)   . Insomnia   . Internal hemorrhoids  04/11/2010   By colonoscopy  . Iron deficiency anemia   . Peripheral arterial disease ([place])    a. s/p PTA and stenting of L CIA stenosis 05/2013.   Marland Kitchen Renal insufficiency   . Tobacco abuse    Quit in 05/2013    Past Surgical History:  Procedure Laterality Date  . CHOLECYSTECTOMY  1975  . COLONOSCOPY  04/11/2010  . CORONARY ANGIOPLASTY WITH STENT PLACEMENT  08/05/2007   Promus DES 2.5 mm x 23 mm (3 mm)  to RCA; EF 60-70%  . ESOPHAGOGASTRODUODENOSCOPY N/A 09/13/2013   Procedure: ESOPHAGOGASTRODUODENOSCOPY (EGD);  Surgeon: Lear Ng, MD;  Location: [facility] ENDOSCOPY;  Service: Endoscopy;  Laterality: N/A;  . ESOPHAGOGASTRODUODENOSCOPY N/A 07/14/2015   Procedure: ESOPHAGOGASTRODUODENOSCOPY (EGD);  Surgeon: Clarene Essex, MD;  Location: [facility] ENDOSCOPY;  Service: Endoscopy;  Laterality: N/A;  . ILIAC ARTERY STENT Left 05/04/2013   L CIA-EIA -- Dr. Gwenlyn Found  . LOWER EXTREMITY ANGIOGRAM Bilateral 05/04/2013   Procedure: LOWER EXTREMITY ANGIOGRAM;  Surgeon: Lorretta Harp, MD;  Location: [facility] CATH LAB;  Service: Cardiovascular;  Laterality: Bilateral;  . NM MYOVIEW LTD  09/07/2015   EF > 65%. Nonspecific ST changes but no ischemic changes. No ischemia or infarction. LOW RISK  . TONSILLECTOMY  1960's  . TRANSTHORACIC ECHOCARDIOGRAM  09/21/2015   EF 60 deficits 5%. GR 1 DD. No regional wall motion analysis. No valve disease.    Social History   Social History  . Marital status: Married    Spouse name: N/A  . Number of children: 1  . Years of education: N/A   Occupational History  .  Retired   Social History Main Topics  . Smoking  status: Former Smoker    Types: Cigarettes    Quit date: 05/04/2013  . Smokeless tobacco: Never Used  . Alcohol use Yes     Comment: occasionally 3 times a year  . Drug use: No  . Sexual activity: No   Other Topics Concern  . Not on file   Social History Narrative   Married. Has one child. Grandmother for a great-grandmother and 1.   No real exercise.   Denies alcohol consumption.   Quit smoking inf Feb 2015 -- after L Iliac Stent    No Known Allergies  Family History  Problem Relation Age of Onset   . Diabetes Mother   . Heart disease Mother   . Heart attack Mother 83  . Breast cancer Mother 70  . Colon cancer Mother 61  . Heart attack Father 71  . Diabetes Sister   . Breast cancer Sister 57    Prior to Admission medications   Medication Sig Start Date End Date Taking? Authorizing Provider  albuterol (PROVENTIL HFA;VENTOLIN HFA) 108 (90 BASE) MCG/ACT inhaler Inhale 2 puffs into the lungs every 4 (four) hours as needed. For shortness of breath   Yes Historical Provider, MD  amLODipine (NORVASC) 5 MG tablet Take 5 mg by mouth daily.   Yes Historical Provider, MD  atorvastatin (LIPITOR) 40 MG tablet Take 40 mg by mouth daily.   Yes Historical Provider, MD  clopidogrel (PLAVIX) 75 MG tablet Take 75 mg by mouth daily.   Yes Historical Provider, MD  ferrous gluconate (FERGON) 324 MG tablet Take 1 tablet (324 mg total) by mouth 2 (two) times daily with a meal. Patient taking differently: Take 324 mg by mouth 3 (three) times daily.  07/15/15  Yes Ripudeep Krystal Eaton, MD  Fluticasone-Salmeterol (ADVAIR) 100-50 MCG/DOSE AEPB Inhale 1 puff into the lungs 2 (two) times daily as needed (shortness of breath).   Yes Historical Provider, MD  isosorbide mononitrate (IMDUR) 60 MG 24 hr tablet Take 1.5 tablets (90 mg total) by mouth daily. 08/08/15  Yes Brett Canales, PA-C  loratadine (CLARITIN) 10 MG tablet Take 10 mg by mouth daily as needed for allergies.   Yes Historical Provider, MD  metoprolol succinate (TOPROL-XL) 100 MG 24 hr tablet Take 150 mg by mouth daily. Take with or immediately following a meal.   Yes Historical Provider, MD  nitroGLYCERIN (NITROSTAT) 0.4 MG SL tablet Place 1 tablet (0.4 mg total) under the tongue every 5 (five) minutes as needed for chest pain. 12/22/12  Yes Leonie Man, MD  pantoprazole (PROTONIX) 40 MG tablet Take 1 tablet (40 mg total) by mouth daily. 09/14/13  Yes Jonetta Osgood, MD    Physical Exam: Vitals:   12/19/15 1515 12/19/15 1545 12/19/15 1600 12/19/15 1630    BP: (!) 122/51 131/61 (!) 115/52 (!) 108/49  Pulse: 85 86 84 86  Resp: 20 20 17 13   Temp:      TempSrc:      SpO2: 100% 97% 100% 99%  Weight:      Height:         General:  Appears calm and comfortable Eyes:  PERRL, EOMI, normal lids, iris ENT:  grossly normal hearing, lips & tongue, mmm Neck:  no LAD, masses or thyromegaly Cardiovascular:  RRR, no m/r/g. No LE edema.  Respiratory:  CTA bilaterally, no w/r/r. Normal respiratory effort. Abdomen:  soft, ntnd, NABS Skin:  no rash or induration seen on limited exam Musculoskeletal:  grossly normal tone BUE/BLE, good  ROM, no bony abnormality Psychiatric:  grossly normal mood and affect, speech fluent and appropriate, AOx3 Neurologic:  CN 2-12 grossly intact, moves all extremities in coordinated fashion, sensation intact  Labs on Admission: I have personally reviewed following labs and imaging studies  CBC:  Recent Labs Lab 12/19/15 1051  WBC 7.6  HGB 5.4*  HCT 19.4*  MCV 86.6  PLT 793   Basic Metabolic Panel:  Recent Labs Lab 12/19/15 1051  NA 138  K 4.5  CL 110  CO2 21*  GLUCOSE 99  BUN 15  CREATININE 1.29*  CALCIUM 8.9   GFR: Estimated Creatinine Clearance: 33.3 mL/min (by C-G formula based on SCr of 1.29 mg/dL (H)). Liver Function Tests:  Recent Labs Lab 12/19/15 1051  AST 16  ALT 10*  ALKPHOS 47  BILITOT 0.6  PROT 6.9  ALBUMIN 3.5   No results for input(s): LIPASE, AMYLASE in the last 168 hours. No results for input(s): AMMONIA in the last 168 hours. Coagulation Profile: No results for input(s): INR, PROTIME in the last 168 hours. Cardiac Enzymes: No results for input(s): CKTOTAL, CKMB, CKMBINDEX, TROPONINI in the last 168 hours. BNP (last 3 results) No results for input(s): PROBNP in the last 8760 hours. HbA1C: No results for input(s): HGBA1C in the last 72 hours. CBG: No results for input(s): GLUCAP in the last 168 hours. Lipid Profile: No results for input(s): CHOL, HDL, LDLCALC, TRIG,  CHOLHDL, LDLDIRECT in the last 72 hours. Thyroid Function Tests: No results for input(s): TSH, T4TOTAL, FREET4, T3FREE, THYROIDAB in the last 72 hours. Anemia Panel:  Recent Labs  12/19/15 1235  VITAMINB12 230  FOLATE 9.1  FERRITIN 19  TIBC 413  IRON 16*  RETICCTPCT 12.2*   Urine analysis:    Component Value Date/Time   COLORURINE lt. yellow 12/30/2007 1014   APPEARANCEUR Clear 12/30/2007 1014   LABSPEC 1.025 06/07/2009 0925   PHURINE 5.0 06/07/2009 0925   HGBUR negative 06/07/2009 0925   BILIRUBINUR negative 06/07/2009 0925   UROBILINOGEN 0.2 06/07/2009 0925   NITRITE negative 06/07/2009 0925    Creatinine Clearance: Estimated Creatinine Clearance: 33.3 mL/min (by C-G formula based on SCr of 1.29 mg/dL (H)).  Sepsis Labs: @LABRCNTIP (procalcitonin:4,lacticidven:4) )No results found for this or any previous visit (from the past 240 hour(s)).   Radiological Exams on Admission: No results found.  EKG: Independently reviewed.   Assessment/Plan Active Problems:   CAD S/P percutaneous coronary angioplasty -- Promus DES 2.5 mm x 20 mm postdilated to 3 mm   Asthma   PAD (peripheral artery disease) - bilateral CIA & CFA 50-69%; Claudication   GI bleed   Symptomatic anemia   GI bleed: chronic intermittent problem for pt. Hgb 5.4. FOBT +. Discussed case with [facility] who has followed the patient for some time. Of note patient had colonoscopy in August 2017 showing diverticulosis, upper endoscopy in April 2017 showing angiodysplasia, capsular endoscopy showing jejunal AVM. Recommending small bowel enteroscopy. - Transfusion as below - Regular diet until 08/19/15 then change to clear liquid diet then NPO after midnight on 12/20/15 - Follow-up recommendations per GI - small bowel enteroscopy planned for 12/21/2015  Symptomatic anemia: likely from GI bleed. Hgb 5.4. 2 units PRBC ordered by EDP. Hemodynamically stable.  - 2 Units PRBC - f/u Post transfusion labs.  - CBC in  am  CAD/Stent/PVD:  - continue ASA, Plavix, Statin - Defer stopping plavix to GI  HTN: - Imdur, norvasc, Metop  GERD; - continue ppi  asthma: - continue Advair  DVT prophylaxis: SCD  Code Status: full  Family Communication: son  Disposition Plan: pending improvement and possible enteroscopy by GI  Consults called: GI - [facility]  Admission status: inpt    Tamer Baughman J MD [facility]  If 7PM-7AM, please contact night-coverage [email or web address] Password [email or web address]  12/19/2015, 5:13 PM

## 2015-12-19 NOTE — ED Notes (Signed)
MD at bedside. 

## 2015-12-19 NOTE — ED Provider Notes (Signed)
[facility] DEPT Provider Note   CSN: [id number] Arrival date & time: 12/19/15  1029     History   Chief Complaint Chief Complaint  Patient presents with  . Shortness of Breath    pt c/o SOB and dissiness since last Thursday     HPI Kayla Mcclure is a 70 y.o. female.   Shortness of Breath  This is a recurrent problem. The current episode started more than 2 days ago. The problem has not changed since onset.Associated symptoms include chest pain. Pertinent negatives include no fever, no rhinorrhea, no cough and no syncope. It is unknown what precipitated the problem. She has tried nothing for the symptoms. The treatment provided no relief. She has had prior ED visits.    Past Medical History:  Diagnosis Date  . Asthma  10/31/2007  . CAD (coronary artery disease) 08/05/2007   a) NSTEMI 2009: PCI to the RCA Promus DES 2.5 mm x 23 mm (3.0 mm). b) Myoview June 2017: Nonspecific ST changes. EF greater than 65%. LOW RISK, normal study. No ischemia or infarction.  . Carotid artery disease ([place])    a. 50-69% RICA by outpt dopp 04/22/13, f/u due 04/2014.  Marland Kitchen Chronic back pain   . CKD (chronic kidney disease), stage II   . Diverticulosis   04/11/2010    Colonoscopy  . Dyslipidemia, goal LDL below 70   . Essential hypertension    a. Normal renal arteries by PV angio 05/2013.  Marland Kitchen GERD (gastroesophageal reflux disease)   . Insomnia   . Internal hemorrhoids  04/11/2010   By colonoscopy  . Iron deficiency anemia   . Peripheral arterial disease ([place])    a. s/p PTA and stenting of L CIA stenosis 05/2013.  Marland Kitchen Renal insufficiency   . Tobacco abuse    Quit in 05/2013    Patient Active Problem List   Diagnosis Date Noted  . Chest pain with low risk for cardiac etiology 09/03/2015  . DOE (dyspnea on exertion) 09/03/2015  . Orthostatic dizziness 07/13/2015  . Bilateral carotid artery disease ([place]) 07/08/2015  . GI bleed 09/27/2013  . Anemia 09/11/2013  . Claudication ([place]) 05/04/2013    . Claudication of left lower extremity > right 03/06/2013  . Dyslipidemia, goal LDL below 70 12/23/2012  . PAD (peripheral artery disease) - bilateral CIA & CFA 50-69%; Claudication 12/22/2012  . Obesity (BMI 30-39.9) 12/22/2012  . Internal hemorrhoids 04/11/2010  . Diverticulosis 04/11/2010  . Chronic insomnia 10/07/2009  . Smokes tobacco daily 02/04/2009  . Iron deficiency anemia 09/23/2008  . Chronic back pain 09/23/2008  . CKD, stage 2 08/15/2008  . Essential hypertension, benign 10/31/2007  . Asthma 10/31/2007  . CAD S/P percutaneous coronary angioplasty -- Promus DES 2.5 mm x 20 mm postdilated to 3 mm 08/05/2007    Past Surgical History:  Procedure Laterality Date  . CHOLECYSTECTOMY  1975  . COLONOSCOPY  04/11/2010  . CORONARY ANGIOPLASTY WITH STENT PLACEMENT  08/05/2007   Promus DES 2.5 mm x 23 mm (3 mm)  to RCA; EF 60-70%  . ESOPHAGOGASTRODUODENOSCOPY N/A 09/13/2013   Procedure: ESOPHAGOGASTRODUODENOSCOPY (EGD);  Surgeon: Lear Ng, MD;  Location: [facility] ENDOSCOPY;  Service: Endoscopy;  Laterality: N/A;  . ESOPHAGOGASTRODUODENOSCOPY N/A 07/14/2015   Procedure: ESOPHAGOGASTRODUODENOSCOPY (EGD);  Surgeon: Clarene Essex, MD;  Location[facility] ENDOSCOPY;  Service: Endoscopy;  Laterality: N/A;  . ILIAC ARTERY STENT Left 05/04/2013   L CIA-EIA -- Dr. Gwenlyn Found  . LOWER EXTREMITY ANGIOGRAM Bilateral 05/04/2013   Procedure: LOWER EXTREMITY  ANGIOGRAM;  Surgeon: Lorretta Harp, MD;  Location: [facility] CATH LAB;  Service: Cardiovascular;  Laterality: Bilateral;  . NM MYOVIEW LTD  09/07/2015   EF > 65%. Nonspecific ST changes but no ischemic changes. No ischemia or infarction. LOW RISK  . TONSILLECTOMY  1960's  . TRANSTHORACIC ECHOCARDIOGRAM  09/21/2015   EF 60 deficits 5%. GR 1 DD. No regional wall motion analysis. No valve disease.    OB History    Gravida Para Term Preterm AB Living   1 1           SAB TAB Ectopic Multiple Live Births                   Home Medications    Prior to  Admission medications   Medication Sig Start Date End Date Taking? Authorizing Provider  albuterol (PROVENTIL HFA;VENTOLIN HFA) 108 (90 BASE) MCG/ACT inhaler Inhale 2 puffs into the lungs every 4 (four) hours as needed. For shortness of breath    Historical Provider, MD  amLODipine (NORVASC) 5 MG tablet Take 5 mg by mouth daily.    Historical Provider, MD  atorvastatin (LIPITOR) 40 MG tablet Take 40 mg by mouth daily.    Historical Provider, MD  ferrous gluconate (FERGON) 324 MG tablet Take 1 tablet (324 mg total) by mouth 2 (two) times daily with a meal. 07/15/15   Ripudeep K Rai, MD  Fluticasone-Salmeterol (ADVAIR) 100-50 MCG/DOSE AEPB Inhale 1 puff into the lungs 2 (two) times daily as needed (shortness of breath).    Historical Provider, MD  isosorbide mononitrate (IMDUR) 60 MG 24 hr tablet Take 1.5 tablets (90 mg total) by mouth daily. 08/08/15   Brett Canales, PA-C  loratadine (CLARITIN) 10 MG tablet Take 10 mg by mouth daily as needed for allergies.    Historical Provider, MD  metoprolol succinate (TOPROL-XL) 100 MG 24 hr tablet Take 150 mg by mouth daily. Take with or immediately following a meal.    Historical Provider, MD  nitroGLYCERIN (NITROSTAT) 0.4 MG SL tablet Place 1 tablet (0.4 mg total) under the tongue every 5 (five) minutes as needed for chest pain. 12/22/12   Leonie Man, MD  pantoprazole (PROTONIX) 40 MG tablet Take 1 tablet (40 mg total) by mouth daily. 09/14/13   Shanker Kristeen Mans, MD    Family History Family History  Problem Relation Age of Onset  . Diabetes Mother   . Heart disease Mother   . Heart attack Mother 6  . Breast cancer Mother 47  . Colon cancer Mother 69  . Heart attack Father 80  . Diabetes Sister   . Breast cancer Sister 19    Social History Social History  Substance Use Topics  . Smoking status: Former Smoker    Types: Cigarettes    Quit date: 05/04/2013  . Smokeless tobacco: Never Used  . Alcohol use Yes     Comment: occasionally 3 times a  year     Allergies   Review of patient's allergies indicates no known allergies.   Review of Systems Review of Systems  Constitutional: Negative for fever.  HENT: Negative for rhinorrhea.   Respiratory: Positive for shortness of breath. Negative for cough.   Cardiovascular: Positive for chest pain. Negative for syncope.  Neurological: Positive for dizziness and light-headedness.  All other systems reviewed and are negative.    Physical Exam Updated Vital Signs BP 139/59 (BP Location: Right Arm)   Pulse 88   Temp 98 F (36.7 C) (  Oral)   Resp 18   Ht 4\' 11"  (1.499 m)   Wt 139 lb (63 kg)   SpO2 100%   BMI 28.07 kg/m   Physical Exam  Constitutional: She is oriented to person, place, and time. She appears well-developed and well-nourished. No distress.  HENT:  Head: Normocephalic and atraumatic.  Eyes: Conjunctivae are normal.  Neck: Neck supple.  Cardiovascular: Normal rate and regular rhythm.   No murmur heard. Pulmonary/Chest: Effort normal and breath sounds normal. No respiratory distress.  Abdominal: Soft. There is no tenderness.  Musculoskeletal: She exhibits no edema or deformity.  Neurological: She is alert and oriented to person, place, and time.  Skin: Skin is warm and dry.  Psychiatric: She has a normal mood and affect.  Nursing note and vitals reviewed.    ED Treatments / Results  Labs (all labs ordered are listed, but only abnormal results are displayed) Labs Reviewed  CBC - Abnormal; Notable for the following:       Result Value   RBC 2.24 (*)    Hemoglobin 5.4 (*)    HCT 19.4 (*)    MCH 24.1 (*)    MCHC 27.8 (*)    RDW 21.3 (*)    All other components within normal limits  COMPREHENSIVE METABOLIC PANEL  OCCULT BLOOD X 1 CARD TO LAB, STOOL  POC OCCULT BLOOD, ED  TYPE AND SCREEN  PREPARE RBC (CROSSMATCH)    EKG  EKG Interpretation None       Radiology No results found.  Procedures Procedures (including critical care  time)  CRITICAL CARE Performed by: Merrily Pew Total critical care time: 30 minutes Critical care time was exclusive of separately billable procedures and treating other patients. Critical care was necessary to treat or prevent imminent or life-threatening deterioration. Critical care was time spent personally by me on the following activities: development of treatment plan with patient and/or surrogate as well as nursing, discussions with consultants, evaluation of patient's response to treatment, examination of patient, obtaining history from patient or surrogate, ordering and performing treatments and interventions, ordering and review of laboratory studies, ordering and review of radiographic studies, pulse oximetry and re-evaluation of patient's condition.  33Medications Ordered in ED Medications  clopidogrel (PLAVIX) tablet 75 mg (not administered)  isosorbide mononitrate (IMDUR) 24 hr tablet 90 mg (not administered)  ferrous gluconate (FERGON) tablet 324 mg (324 mg Oral Given 12/19/15 2124)  pantoprazole (PROTONIX) EC tablet 40 mg (40 mg Oral Given 12/19/15 2125)  amLODipine (NORVASC) tablet 5 mg (not administered)  atorvastatin (LIPITOR) tablet 40 mg (40 mg Oral Given 12/19/15 2125)  nitroGLYCERIN (NITROSTAT) SL tablet 0.4 mg (not administered)  mometasone-formoterol (DULERA) 100-5 MCG/ACT inhaler 2 puff (2 puffs Inhalation Given 12/20/15 0702)  metoprolol succinate (TOPROL-XL) 24 hr tablet 150 mg (not administered)  sodium chloride flush (NS) 0.9 % injection 3 mL (3 mLs Intravenous Not Given 12/19/15 2200)  sodium chloride flush (NS) 0.9 % injection 3 mL (3 mLs Intravenous Not Given 12/19/15 2200)  sodium chloride flush (NS) 0.9 % injection 3 mL (not administered)  0.9 %  sodium chloride infusion (250 mLs Intravenous New Bag/Given 12/20/15 0312)  acetaminophen (TYLENOL) tablet 650 mg (650 mg Oral Given 12/19/15 2037)    Or  acetaminophen (TYLENOL) suppository 650 mg ( Rectal See  Alternative 12/19/15 2037)  promethazine (PHENERGAN) tablet 12.5 mg (not administered)  0.9 %  sodium chloride infusion ( Intravenous New Bag/Given 12/19/15 2025)     Initial Impression / Assessment and Plan /  ED Course  I have reviewed the triage vital signs and the nursing notes.  Pertinent labs & imaging results that were available during my care of the patient were reviewed by me and considered in my medical decision making (see chart for details).  Clinical Course    70 yo F w/ symptomatic acute blood loss anemia likely 2/2 GI bleed. History of same without a known cause. Exam otherwise benign. Just in case, anemia panel added on. Patient consented and blood ordered, will admit for further management.   Final Clinical Impressions(s) / ED Diagnoses   Final diagnoses:  Acute blood loss anemia    New Prescriptions New Prescriptions   No medications on file     Merrily Pew, MD 12/20/15 [phone]

## 2015-12-19 NOTE — ED Notes (Signed)
Nira Conn, RN accepts report. She reports PT is going to bed 11 and not bed 15

## 2015-12-20 ENCOUNTER — Encounter (HOSPITAL_COMMUNITY): Payer: Self-pay | Admitting: General Practice

## 2015-12-20 DIAGNOSIS — D62 Acute posthemorrhagic anemia: Secondary | ICD-10-CM | POA: Diagnosis present

## 2015-12-20 DIAGNOSIS — K2901 Acute gastritis with bleeding: Secondary | ICD-10-CM

## 2015-12-20 DIAGNOSIS — J4520 Mild intermittent asthma, uncomplicated: Secondary | ICD-10-CM

## 2015-12-20 LAB — COMPREHENSIVE METABOLIC PANEL
ALK PHOS: 41 U/L (ref 38–126)
ALT: 8 U/L — AB (ref 14–54)
AST: 15 U/L (ref 15–41)
Albumin: 2.9 g/dL — ABNORMAL LOW (ref 3.5–5.0)
Anion gap: 6 (ref 5–15)
BUN: 17 mg/dL (ref 6–20)
CALCIUM: 8.5 mg/dL — AB (ref 8.9–10.3)
CHLORIDE: 110 mmol/L (ref 101–111)
CO2: 23 mmol/L (ref 22–32)
CREATININE: 1.26 mg/dL — AB (ref 0.44–1.00)
GFR calc non Af Amer: 42 mL/min — ABNORMAL LOW (ref >60)
GFR, EST AFRICAN AMERICAN: 49 mL/min — AB (ref >60)
Glucose, Bld: 95 mg/dL (ref 65–99)
Potassium: 4.3 mmol/L (ref 3.5–5.1)
SODIUM: 139 mmol/L (ref 135–145)
Total Bilirubin: 1 mg/dL (ref 0.3–1.2)
Total Protein: 5.9 g/dL — ABNORMAL LOW (ref 6.5–8.1)

## 2015-12-20 LAB — CBC
HCT: 22.2 % — ABNORMAL LOW (ref 36.0–46.0)
HCT: 30.6 % — ABNORMAL LOW (ref 36.0–46.0)
HEMOGLOBIN: 6.7 g/dL — AB (ref 12.0–15.0)
Hemoglobin: 9.2 g/dL — ABNORMAL LOW (ref 12.0–15.0)
MCH: 26.1 pg (ref 26.0–34.0)
MCH: 24.3 pg — AB (ref 26.0–34.0)
MCHC: 30.2 g/dL (ref 30.0–36.0)
MCHC: 30.1 g/dL (ref 30.0–36.0)
MCV: 86.4 fL (ref 78.0–100.0)
MCV: 81 fL (ref 78.0–100.0)
PLATELETS: 213 10*3/uL (ref 150–400)
PLATELETS: 232 10*3/uL (ref 150–400)
RBC: 2.57 MIL/uL — AB (ref 3.87–5.11)
RBC: 3.78 MIL/uL — ABNORMAL LOW (ref 3.87–5.11)
RDW: 18.8 % — ABNORMAL HIGH (ref 11.5–15.5)
RDW: 21.8 % — AB (ref 11.5–15.5)
WBC: 6.3 10*3/uL (ref 4.0–10.5)
WBC: 5.5 10*3/uL (ref 4.0–10.5)

## 2015-12-20 LAB — PREPARE RBC (CROSSMATCH)

## 2015-12-20 MED ORDER — SODIUM CHLORIDE 0.9 % IV SOLN: Freq: Once | INTRAVENOUS | Status: DC

## 2015-12-20 NOTE — Progress Notes (Signed)
[facility] PROGRESS NOTE  Kayla Mcclure SWN:[id number] DOB: 12-Nov-1945 DOA: 12/19/2015   PCP: Antony Blackbird, MD     Assessment/Plan: Active Problems:   CAD S/P percutaneous coronary angioplasty -- Promus DES 2.5 mm x 20 mm postdilated to 3 mm   Asthma   PAD (peripheral artery disease) - bilateral CIA & CFA 50-69%; Claudication   GI bleed   Symptomatic anemia   Acute blood loss anemia    70 year old female who was admitted to the hospital with severe anemia. She has been undergoing evaluation for anemia as an outpatient by Dr. Watt Climes. She had an EGD in April of this year which showed a small hiatal hernia and a small nonbleeding angiodysplasia in the stomach and duodenum.  Colonoscopy in August of this year showing diverticulosis of the sigmoid and descending colon. She had a capsule endoscopy on 12/14/2015 which was read as evidence of active bleeding in the jejunum presumably from AVM.  Her hemoglobin 3 months ago was 11.3. She was admitted to the hospital yesterday with a hemoglobin of 5.4. She has been transfused 2 unit of blood and it came up to 6.7. According to Dr.Magod  she is scheduled for small bowel enteroscopy with APC of any angiodysplastic lesions that were seen or might be bleeding.   Assessment and plan GI bleed: chronic intermittent problem for pt. Hgb 5.4. FOBT +.seen by [facility]  , Dr. Penelope Coop -Continue transfusions, try to get hemoglobin above 8.0. Discontinue Plavix Soft diet Needs to be off Plavix for 5-7 days, - small bowel enteroscopy as outpatient Discharge tomorrow if hemoglobin greater than 8.0   Symptomatic anemia: likely from GI bleed. Hgb 5.4. 2 units PRBC ordered by EDP. Hemodynamically stable.  - 2 Units PRBC We'll order another unit of packed red blood cells     CAD / stenting 2009. Peripheral vascular disease -holding plavix and asa for now for upper GI bleed/ anemia / heme + stools May be resumed after GI procedure as an  outpatient  PAD and carotid artery disease, s/p PTA and stenting of left common iliac artery 2015.   HTN: - Imdur, norvasc, Metop  GERD; - continue ppi  asthma: - continue Advair   DVT prophylaxsis SCDs  Code Status:  Full code     Family Communication: Discussed in detail with the patient, all imaging results, lab results explained to the patient   Disposition Plan:   Anticipate discharge tomorrow      Consultants:  None  Procedures:  None  Antibiotics: Anti-infectives    None         HPI/Subjective: Denies any active GI bleeding   Objective: Vitals:   12/20/15 0331 12/20/15 0407 12/20/15 0727 12/20/15 1016  BP: (!) 125/58 (!) 124/54 (!) 142/56 (!) 136/58  Pulse: 73 74 78 78  Resp:      Temp: 98.6 F (37 C) 98.3 F (36.8 C) 98.7 F (37.1 C) 98.4 F (36.9 C)  TempSrc: Oral Oral Oral Oral  SpO2: 97% 99% 98% 98%  Weight:      Height:        Intake/Output Summary (Last 24 hours) at 12/20/15 1031 Last data filed at 12/20/15 0830  Gross per 24 hour  Intake             1174 ml  Output              650 ml  Net  524 ml    Exam:  Examination:  General exam: Appears calm and comfortable  Respiratory system: Clear to auscultation. Respiratory effort normal. Cardiovascular system: S1 & S2 heard, RRR. No JVD, murmurs, rubs, gallops or clicks. No pedal edema. Gastrointestinal system: Abdomen is nondistended, soft and nontender. No organomegaly or masses felt. Normal bowel sounds heard. Central nervous system: Alert and oriented. No focal neurological deficits. Extremities: Symmetric 5 x 5 power. Skin: No rashes, lesions or ulcers Psychiatry: Judgement and insight appear normal. Mood & affect appropriate.     Data Reviewed: I have personally reviewed following labs and imaging studies  Micro Results No results found for this or any previous visit (from the past 240 hour(s)).  Radiology Reports No results found.    CBC  Recent Labs Lab 12/19/15 1051 12/20/15 0333  WBC 7.6 6.3  HGB 5.4* 6.7*  HCT 19.4* 22.2*  PLT 261 213  MCV 86.6 86.4  MCH 24.1* 26.1  MCHC 27.8* 30.2  RDW 21.3* 18.8*    Chemistries   Recent Labs Lab 12/19/15 1051 12/20/15 0333  NA 138 139  K 4.5 4.3  CL 110 110  CO2 21* 23  GLUCOSE 99 95  BUN 15 17  CREATININE 1.29* 1.26*  CALCIUM 8.9 8.5*  AST 16 15  ALT 10* 8*  ALKPHOS 47 41  BILITOT 0.6 1.0   ------------------------------------------------------------------------------------------------------------------ estimated creatinine clearance is 34.1 mL/min (by C-G formula based on SCr of 1.26 mg/dL (H)). ------------------------------------------------------------------------------------------------------------------ No results for input(s): HGBA1C in the last 72 hours. ------------------------------------------------------------------------------------------------------------------ No results for input(s): CHOL, HDL, LDLCALC, TRIG, CHOLHDL, LDLDIRECT in the last 72 hours. ------------------------------------------------------------------------------------------------------------------ No results for input(s): TSH, T4TOTAL, T3FREE, THYROIDAB in the last 72 hours.  Invalid input(s): FREET3 ------------------------------------------------------------------------------------------------------------------  Recent Labs  12/19/15 1235  VITAMINB12 230  FOLATE 9.1  FERRITIN 19  TIBC 413  IRON 16*  RETICCTPCT 12.2*    Coagulation profile No results for input(s): INR, PROTIME in the last 168 hours.  No results for input(s): DDIMER in the last 72 hours.  Cardiac Enzymes No results for input(s): CKMB, TROPONINI, MYOGLOBIN in the last 168 hours.  Invalid input(s): CK ------------------------------------------------------------------------------------------------------------------ Invalid input(s): POCBNP   CBG: No results for input(s): GLUCAP in the  last 168 hours.     Studies: No results found.    No results found for: HGBA1C Lab Results  Component Value Date   LDLCALC 82 06/13/2010   CREATININE 1.26 (H) 12/20/2015       Scheduled Meds: . amLODipine  5 mg Oral Daily  . atorvastatin  40 mg Oral Daily  . ferrous gluconate  324 mg Oral TID WC  . isosorbide mononitrate  90 mg Oral Daily  . metoprolol succinate  150 mg Oral Daily  . mometasone-formoterol  2 puff Inhalation BID  . pantoprazole  40 mg Oral Daily  . sodium chloride flush  3 mL Intravenous Q12H  . sodium chloride flush  3 mL Intravenous Q12H   Continuous Infusions:    LOS: 1 day    Time spent: >30 MINS    [facility]  [facility] Pager [phone]. If 7PM-7AM, please contact night-coverage at [email or web address], password [facility] 12/20/2015, 10:31 AM  LOS: 1 day

## 2015-12-20 NOTE — Progress Notes (Signed)
Pt second unit of blood has arrived from [place]. To be admin as previously ordered. Meryl Crutch, RN

## 2015-12-20 NOTE — Consult Note (Signed)
Subjective:   HPI  The patient is a 70 year old female who was admitted to the hospital with severe anemia. She has been undergoing evaluation for anemia as an outpatient by Dr. Watt Climes. She had an EGD in April of this year which showed a small hiatal hernia and a small nonbleeding angiodysplasia in the stomach and duodenum. Showed a colonoscopy in August of this year showing diverticulosis of the sigmoid and descending colon. She had a capsule endoscopy on 12/14/2015 which was read as evidence of active bleeding in the jejunum presumably from AVM. Her hemoglobin 3 months ago was 11.3. She was admitted to the hospital yesterday with a hemoglobin of 5.4. She has been transfused 2 unit of blood and it came up to 6.7. In speaking to Dr.Magod the next 5 was to do a small bowel enteroscopy with APC of any angiodysplastic lesions that were seen or might be bleeding. The patient is on Plavix. She is having to get her blood from [place] according to what she tells me because of antibodies.   Review of Systems Feels weak. No chest pain today  Past Medical History:  Diagnosis Date  . Asthma  10/31/2007  . CAD (coronary artery disease) 08/05/2007   a) NSTEMI 2009: PCI to the RCA Promus DES 2.5 mm x 23 mm (3.0 mm). b) Myoview June 2017: Nonspecific ST changes. EF greater than 65%. LOW RISK, normal study. No ischemia or infarction.  . Carotid artery disease ([place])    a. 50-69% RICA by outpt dopp 04/22/13, f/u due 04/2014.  Marland Kitchen Chronic back pain   . CKD (chronic kidney disease), stage II   . Diverticulosis   04/11/2010    Colonoscopy  . Dyslipidemia, goal LDL below 70   . Essential hypertension    a. Normal renal arteries by PV angio 05/2013.  Marland Kitchen GERD (gastroesophageal reflux disease)   . Insomnia   . Internal hemorrhoids  04/11/2010   By colonoscopy  . Iron deficiency anemia   . Peripheral arterial disease ([place])    a. s/p PTA and stenting of L CIA stenosis 05/2013.  Marland Kitchen Renal insufficiency   . Tobacco abuse     Quit in 05/2013   Past Surgical History:  Procedure Laterality Date  . CHOLECYSTECTOMY  1975  . COLONOSCOPY  04/11/2010  . CORONARY ANGIOPLASTY WITH STENT PLACEMENT  08/05/2007   Promus DES 2.5 mm x 23 mm (3 mm)  to RCA; EF 60-70%  . ESOPHAGOGASTRODUODENOSCOPY N/A 09/13/2013   Procedure: ESOPHAGOGASTRODUODENOSCOPY (EGD);  Surgeon: Lear Ng, MD;  Location[facility] ENDOSCOPY;  Service: Endoscopy;  Laterality: N/A;  . ESOPHAGOGASTRODUODENOSCOPY N/A 07/14/2015   Procedure: ESOPHAGOGASTRODUODENOSCOPY (EGD);  Surgeon: Clarene Essex, MD;  Location: [facility] ENDOSCOPY;  Service: Endoscopy;  Laterality: N/A;  . ILIAC ARTERY STENT Left 05/04/2013   L CIA-EIA -- Dr. Gwenlyn Found  . LOWER EXTREMITY ANGIOGRAM Bilateral 05/04/2013   Procedure: LOWER EXTREMITY ANGIOGRAM;  Surgeon: Lorretta Harp, MD;  Location: [facility] CATH LAB;  Service: Cardiovascular;  Laterality: Bilateral;  . NM MYOVIEW LTD  09/07/2015   EF > 65%. Nonspecific ST changes but no ischemic changes. No ischemia or infarction. LOW RISK  . TONSILLECTOMY  1960's  . TRANSTHORACIC ECHOCARDIOGRAM  09/21/2015   EF 60 deficits 5%. GR 1 DD. No regional wall motion analysis. No valve disease.   Social History   Social History  . Marital status: Married    Spouse name: N/A  . Number of children: 1  . Years of education: N/A   Occupational  History  .  Retired   Social History Main Topics  . Smoking status: Former Smoker    Types: Cigarettes    Quit date: 05/04/2013  . Smokeless tobacco: Never Used  . Alcohol use Yes     Comment: occasionally 3 times a year  . Drug use: No  . Sexual activity: No   Other Topics Concern  . Not on file   Social History Narrative   Married. Has one child. Grandmother for a great-grandmother and 1.   No real exercise.   Denies alcohol consumption.   Quit smoking inf Feb 2015 -- after L Iliac Stent   family history includes Breast cancer (age of onset: 76) in her sister; Breast cancer (age of onset: 52) in her  mother; Colon cancer (age of onset: 94) in her mother; Diabetes in her mother and sister; Heart attack (age of onset: 43) in her father; Heart attack (age of onset: 80) in her mother; Heart disease in her mother.  Current Facility-Administered Medications:  .  0.9 %  sodium chloride infusion, 250 mL, Intravenous, PRN, Waldemar Dickens, MD, Last Rate: 10 mL/hr at 12/20/15 0312, 250 mL at 12/20/15 6812 .  acetaminophen (TYLENOL) tablet 650 mg, 650 mg, Oral, Q6H PRN, 650 mg at 12/19/15 2037 **OR** acetaminophen (TYLENOL) suppository 650 mg, 650 mg, Rectal, Q6H PRN, Waldemar Dickens, MD .  amLODipine (NORVASC) tablet 5 mg, 5 mg, Oral, Daily, Waldemar Dickens, MD .  atorvastatin (LIPITOR) tablet 40 mg, 40 mg, Oral, Daily, Waldemar Dickens, MD, 40 mg at 12/19/15 2125 .  clopidogrel (PLAVIX) tablet 75 mg, 75 mg, Oral, Daily, Waldemar Dickens, MD .  ferrous gluconate [facility]) tablet 324 mg, 324 mg, Oral, TID WC, Waldemar Dickens, MD, 324 mg at 12/19/15 2124 .  isosorbide mononitrate (IMDUR) 24 hr tablet 90 mg, 90 mg, Oral, Daily, Waldemar Dickens, MD .  metoprolol succinate (TOPROL-XL) 24 hr tablet 150 mg, 150 mg, Oral, Daily, Waldemar Dickens, MD .  mometasone-formoterol [facility]) 100-5 MCG/ACT inhaler 2 puff, 2 puff, Inhalation, BID, Waldemar Dickens, MD, 2 puff at 12/20/15 [phone] .  nitroGLYCERIN (NITROSTAT) SL tablet 0.4 mg, 0.4 mg, Sublingual, Q5 min PRN, Waldemar Dickens, MD .  pantoprazole (PROTONIX) EC tablet 40 mg, 40 mg, Oral, Daily, Waldemar Dickens, MD, 40 mg at 12/19/15 2125 .  promethazine (PHENERGAN) tablet 12.5 mg, 12.5 mg, Oral, Q6H PRN, Waldemar Dickens, MD .  sodium chloride flush (NS) 0.9 % injection 3 mL, 3 mL, Intravenous, Q12H, Waldemar Dickens, MD .  sodium chloride flush (NS) 0.9 % injection 3 mL, 3 mL, Intravenous, Q12H, Waldemar Dickens, MD .  sodium chloride flush (NS) 0.9 % injection 3 mL, 3 mL, Intravenous, PRN, Waldemar Dickens, MD No Known Allergies   Objective:     BP (!) 142/56   Pulse  78   Temp 98.7 F (37.1 C) (Oral)   Resp 16   Ht 4\' 11"  (1.499 m)   Wt 63 kg (139 lb)   SpO2 98%   BMI 28.07 kg/m   No acute distress  Heart regular rhythm  Lungs clear  Abdomen soft and nontender  Laboratory No components found for: D1    Assessment:     Chronic GI blood loss presumably from angiodysplasia in the small bowel.      Plan:     Transfuse blood as needed to get her hemoglobin up to an acceptable level. She will need a  small bowel enteroscopy with argon plasma coagulation of any lesions that are seen. I would recommend however before doing this that she be off Plavix for 5-7 days. In discussing this with Dr Watt Climes he had planned on doing this as an outpatient next week. Provided that she remains stable I think this would be reasonable. I would recommend stopping her Plavix now, getting her blood count up to an acceptable level, and if she remains stable discharging her in having her follow-up for outpatient small bowel enteroscopy with APC. Lab Results  Component Value Date   HGB 6.7 (LL) 12/20/2015   HGB 5.4 (LL) 12/19/2015   HGB 11.3 (L) 09/01/2015   HCT 22.2 (L) 12/20/2015   HCT 19.4 (L) 12/19/2015   HCT 37.1 09/01/2015   ALKPHOS 41 12/20/2015   ALKPHOS 47 12/19/2015   ALKPHOS 72 07/14/2015   AST 15 12/20/2015   AST 16 12/19/2015   AST 20 07/14/2015   ALT 8 (L) 12/20/2015   ALT 10 (L) 12/19/2015   ALT 11 (L) 07/14/2015   AMYLASE 51 01/10/2010

## 2015-12-21 ENCOUNTER — Encounter (HOSPITAL_COMMUNITY): Payer: Self-pay

## 2015-12-21 ENCOUNTER — Other Ambulatory Visit: Payer: Self-pay | Admitting: Gastroenterology

## 2015-12-21 DIAGNOSIS — K922 Gastrointestinal hemorrhage, unspecified: Principal | ICD-10-CM

## 2015-12-21 DIAGNOSIS — D62 Acute posthemorrhagic anemia: Secondary | ICD-10-CM

## 2015-12-21 DIAGNOSIS — D649 Anemia, unspecified: Secondary | ICD-10-CM

## 2015-12-21 DIAGNOSIS — Z9861 Coronary angioplasty status: Secondary | ICD-10-CM

## 2015-12-21 DIAGNOSIS — I2510 Atherosclerotic heart disease of native coronary artery without angina pectoris: Secondary | ICD-10-CM

## 2015-12-21 LAB — COMPREHENSIVE METABOLIC PANEL
ALBUMIN: 3 g/dL — AB (ref 3.5–5.0)
ALT: 9 U/L — ABNORMAL LOW (ref 14–54)
ANION GAP: 6 (ref 5–15)
AST: 17 U/L (ref 15–41)
Alkaline Phosphatase: 46 U/L (ref 38–126)
BUN: 15 mg/dL (ref 6–20)
CALCIUM: 8.8 mg/dL — AB (ref 8.9–10.3)
CHLORIDE: 111 mmol/L (ref 101–111)
CO2: 22 mmol/L (ref 22–32)
Creatinine, Ser: 1.32 mg/dL — ABNORMAL HIGH (ref 0.44–1.00)
GFR calc non Af Amer: 40 mL/min — ABNORMAL LOW (ref >60)
GFR, EST AFRICAN AMERICAN: 47 mL/min — AB (ref >60)
GLUCOSE: 87 mg/dL (ref 65–99)
POTASSIUM: 4.5 mmol/L (ref 3.5–5.1)
SODIUM: 139 mmol/L (ref 135–145)
Total Bilirubin: 0.9 mg/dL (ref 0.3–1.2)
Total Protein: 5.9 g/dL — ABNORMAL LOW (ref 6.5–8.1)

## 2015-12-21 LAB — CBC
HCT: 32.8 % — ABNORMAL LOW (ref 36.0–46.0)
Hemoglobin: 10 g/dL — ABNORMAL LOW (ref 12.0–15.0)
MCH: 25 pg — AB (ref 26.0–34.0)
MCHC: 30.5 g/dL (ref 30.0–36.0)
MCV: 82 fL (ref 78.0–100.0)
PLATELETS: 202 10*3/uL (ref 150–400)
RBC: 4 MIL/uL (ref 3.87–5.11)
RDW: 21.7 % — AB (ref 11.5–15.5)
WBC: 5.7 10*3/uL (ref 4.0–10.5)

## 2015-12-21 NOTE — Progress Notes (Signed)
No specific complaints. Discussed the plan with the patient. She will be set up for outpatient enteroscopy with APC probably next week with Dr. Watt Climes. She will hold Plavix and aspirin. Dr. Watt Climes office will contact

## 2015-12-21 NOTE — Discharge Summary (Signed)
Physician Discharge Summary  Kayla Mcclure ZOX:[id number] DOB: 10-13-1945 DOA: 12/19/2015  PCP: Antony Blackbird, MD  Admit date: 12/19/2015 Discharge date: 12/21/2015  Admitted From: Home Disposition:  Home  Recommendations for Outpatient Follow-up:  1. Follow up with PCP and GI ( Dr Watt Climes ) in 1-2 weeks. Please repeat hemoglobin in next 2-3 days. 2. Patient is off both aspirin and Plavix until workup completed as outpatient (GI enteroscopy)   Home Health: None Equipment/Devices:None   Discharge Condition: Stable CODE STATUS: Full code Diet recommendation: Heart healthy    Discharge Diagnoses:  Principal Problem:   Acute blood loss anemia   Active Problems:   CAD S/P percutaneous coronary angioplasty -- Promus DES 2.5 mm x 20 mm postdilated to 3 mm   Asthma   PAD (peripheral artery disease) - bilateral CIA & CFA 50-69%; Claudication   GI bleed   Symptomatic anemia  Brief narrative/history of present illness Please refer to admission H&P for details, in brief, 70 year old female with history of coronary artery disease, peripheral arterial disease, history of anemia with workup done as outpatient by Dr. Watt Climes (EGD in April 2017 showing small hiatal hernia and small nonbleeding angiodysplasia in the stomach and duodenum; colonoscopy in August 2017 showed diverticulosis of the sigmoid and descending colon). She then had capsule endoscopy on 9/13 which was read as evidence of acute bleeding in the jejunum presumably from AVM. Patient's hemoglobin 3 months back was 11.3. Patient presented with symptomatically anemia with hemoglobin of 5.4. Admitted to hospitalist service for blood transfusion and GI consulted.  Hospital course Symptomatic anemia due to GI bleed No definite source of GI bleed. Recent capsule endoscopy findings as above. She was transfused total 4 units PRBC with improvement in hemoglobin to 9.7. Patient is asymptomatic. Seen by [facility]. She is scheduled for small  bowel enteroscopy with APC next week with Dr. Watt Climes. Patient taken off aspirin and Plavix until workup done as outpatient. Patient instructed to avoid NSAIDs at all times. Needs outpatient hemoglobin monitored in the next 2-3 days.  Coronary artery disease with stenting in 2009 Aspirin and Plavix will be held given symptomatically anemia with GI bleed and pending outpatient procedure next week. Continue statin.  PAD and carotid artery disease History of PTA and stenting of left common iliac artery in 2015  Hypertension Stable. Resume metoprolol, Imdur and Norvasc.  GERD Continue PPI  Asthma Continue Advair  Chronic kidney disease stage II-III Stable at baseline.   Stable for discharge home with outpatient follow-up.  Family communication: None at bedside  Consults: [facility]   Discharge Instructions     Medication List    STOP taking these medications   clopidogrel 75 MG tablet Commonly known as:  PLAVIX     TAKE these medications   albuterol 108 (90 Base) MCG/ACT inhaler Commonly known as:  PROVENTIL HFA;VENTOLIN HFA Inhale 2 puffs into the lungs every 4 (four) hours as needed. For shortness of breath   amLODipine 5 MG tablet Commonly known as:  NORVASC Take 5 mg by mouth daily.   atorvastatin 40 MG tablet Commonly known as:  LIPITOR Take 40 mg by mouth daily.   ferrous gluconate 324 MG tablet Commonly known as:  FERGON Take 1 tablet (324 mg total) by mouth 2 (two) times daily with a meal. What changed:  when to take this   Fluticasone-Salmeterol 100-50 MCG/DOSE Aepb Commonly known as:  ADVAIR Inhale 1 puff into the lungs 2 (two) times daily as needed (shortness of breath).  isosorbide mononitrate 60 MG 24 hr tablet Commonly known as:  IMDUR Take 1.5 tablets (90 mg total) by mouth daily.   loratadine 10 MG tablet Commonly known as:  CLARITIN Take 10 mg by mouth daily as needed for allergies.   metoprolol succinate 100 MG 24 hr tablet Commonly  known as:  TOPROL-XL Take 150 mg by mouth daily. Take with or immediately following a meal.   nitroGLYCERIN 0.4 MG SL tablet Commonly known as:  NITROSTAT Place 1 tablet (0.4 mg total) under the tongue every 5 (five) minutes as needed for chest pain.   pantoprazole 40 MG tablet Commonly known as:  PROTONIX Take 1 tablet (40 mg total) by mouth daily.       No Known Allergies      Procedures/Studies: None  Subjective: Seen and examined. Reports small amount of dark stool last night. Denies headache, dizziness, dyspnea or chest discomfort.  Discharge Exam: Vitals:   12/20/15 2014 12/21/15 0537  BP: (!) 115/55 127/60  Pulse: 78 79  Resp: 17 16  Temp: 97.8 F (36.6 C) 98.7 F (37.1 C)   Vitals:   12/20/15 1705 12/20/15 1910 12/20/15 2014 12/21/15 0537  BP: (!) 105/57 (!) 104/50 (!) 115/55 127/60  Pulse: 85 72 78 79  Resp: 20 18 17 16   Temp: 98.9 F (37.2 C) 97.7 F (36.5 C) 97.8 F (36.6 C) 98.7 F (37.1 C)  TempSrc: Oral Oral Oral Oral  SpO2: 98% 97% 97% 96%  Weight:   66.1 kg (145 lb 12.8 oz)   Height:   4\' 11"  (1.499 m)     General: Elderly female not in distress HEENT: Pallor present, moist mucosa, supple neck Chest: Clear to auscultation bilaterally Cardiovascular: Normal S1 and S2, no murmurs rub or gallop GI: Soft, nondistended, nontender, bowel sounds present Musculoskeletal: Warm, no edema    The results of significant diagnostics from this hospitalization (including imaging, microbiology, ancillary and laboratory) are listed below for reference.     Microbiology: No results found for this or any previous visit (from the past 240 hour(s)).   Labs: BNP (last 3 results) No results for input(s): BNP in the last 8760 hours. Basic Metabolic Panel:  Recent Labs Lab 12/19/15 1051 12/20/15 0333 12/21/15 0707  NA 138 139 139  K 4.5 4.3 4.5  CL 110 110 111  CO2 21* 23 22  GLUCOSE 99 95 87  BUN 15 17 15   CREATININE 1.29* 1.26* 1.32*   CALCIUM 8.9 8.5* 8.8*   Liver Function Tests:  Recent Labs Lab 12/19/15 1051 12/20/15 0333 12/21/15 0707  AST 16 15 17   ALT 10* 8* 9*  ALKPHOS 47 41 46  BILITOT 0.6 1.0 0.9  PROT 6.9 5.9* 5.9*  ALBUMIN 3.5 2.9* 3.0*   No results for input(s): LIPASE, AMYLASE in the last 168 hours. No results for input(s): AMMONIA in the last 168 hours. CBC:  Recent Labs Lab 12/19/15 1051 12/20/15 0333 12/20/15 1113 12/21/15 0707  WBC 7.6 6.3 5.5 5.7  HGB 5.4* 6.7* 9.2* 10.0*  HCT 19.4* 22.2* 30.6* 32.8*  MCV 86.6 86.4 81.0 82.0  PLT 261 213 232 202   Cardiac Enzymes: No results for input(s): CKTOTAL, CKMB, CKMBINDEX, TROPONINI in the last 168 hours. BNP: Invalid input(s): POCBNP CBG: No results for input(s): GLUCAP in the last 168 hours. D-Dimer No results for input(s): DDIMER in the last 72 hours. Hgb A1c No results for input(s): HGBA1C in the last 72 hours. Lipid Profile No results for input(s): CHOL,  HDL, LDLCALC, TRIG, CHOLHDL, LDLDIRECT in the last 72 hours. Thyroid function studies No results for input(s): TSH, T4TOTAL, T3FREE, THYROIDAB in the last 72 hours.  Invalid input(s): FREET3 Anemia work up  Recent Labs  12/19/15 1235  VITAMINB12 230  FOLATE 9.1  FERRITIN 19  TIBC 413  IRON 16*  RETICCTPCT 12.2*   Urinalysis    Component Value Date/Time   COLORURINE lt. yellow 12/30/2007 1014   APPEARANCEUR Clear 12/30/2007 1014   LABSPEC 1.025 06/07/2009 0925   PHURINE 5.0 06/07/2009 0925   HGBUR negative 06/07/2009 0925   BILIRUBINUR negative 06/07/2009 0925   UROBILINOGEN 0.2 06/07/2009 0925   NITRITE negative 06/07/2009 0925   Sepsis Labs Invalid input(s): PROCALCITONIN,  WBC,  LACTICIDVEN Microbiology No results found for this or any previous visit (from the past 240 hour(s)).   Time coordinating discharge: Over 30 minutes  SIGNED:   Louellen Molder, MD  [facility] 12/21/2015, 9:00 AM Pager   If 7PM-7AM, please contact  night-coverage [email or web address] Password [email or web address]

## 2015-12-21 NOTE — Discharge Instructions (Signed)
Gastrointestinal Bleeding °Gastrointestinal bleeding is bleeding somewhere along the path that food travels through the body (digestive tract). This path is anywhere between the mouth and the opening of the butt (anus). You may have blood in your throw up (vomit) or in your poop (stools). If there is a lot of bleeding, you may need to stay in the hospital. °HOME CARE °· Only take medicine as told by your doctor. °· Eat foods with fiber such as whole grains, fruits, and vegetables. You can also try eating 1 to 3 prunes a day. °· Drink enough fluids to keep your pee (urine) clear or pale yellow. °GET HELP RIGHT AWAY IF:  °· Your bleeding gets worse. °· You feel dizzy, weak, or you pass out (faint). °· You have bad cramps in your back or belly (abdomen). °· You have large blood clumps (clots) in your poop. °· Your problems are getting worse. °MAKE SURE YOU:  °· Understand these instructions. °· Will watch your condition. °· Will get help right away if you are not doing well or get worse. °  °This information is not intended to replace advice given to you by your health care provider. Make sure you discuss any questions you have with your health care provider. °  °Document Released: 12/27/2007 Document Revised: 03/05/2012 Document Reviewed: 09/06/2014 °Elsevier Interactive Patient Education ©2016 [employer] ° °

## 2015-12-22 ENCOUNTER — Encounter (HOSPITAL_COMMUNITY): Payer: Self-pay | Admitting: *Deleted

## 2015-12-23 LAB — TYPE AND SCREEN
ABO/RH(D): A POS
ANTIBODY SCREEN: POSITIVE
DAT, IGG: NEGATIVE
DAT, complement: NEGATIVE
UNIT DIVISION: 0
UNIT DIVISION: 0
Unit division: 0
Unit division: 0

## 2015-12-27 ENCOUNTER — Telehealth: Payer: Self-pay | Admitting: Cardiology

## 2015-12-27 ENCOUNTER — Encounter (HOSPITAL_COMMUNITY)
Admission: RE | Disposition: A | Discharge status: Home / Self Care | Payer: Self-pay | Source: Ambulatory Visit | Attending: Gastroenterology

## 2015-12-27 ENCOUNTER — Ambulatory Visit (HOSPITAL_COMMUNITY): Payer: Medicare Other | Admitting: Anesthesiology

## 2015-12-27 ENCOUNTER — Ambulatory Visit (HOSPITAL_COMMUNITY)
Admission: RE | Admit: 2015-12-27 | Discharge: 2015-12-27 | Disposition: A | Discharge status: Home / Self Care | Payer: Medicare Other | Source: Ambulatory Visit | Attending: Gastroenterology | Admitting: Gastroenterology

## 2015-12-27 ENCOUNTER — Encounter (HOSPITAL_COMMUNITY): Payer: Self-pay | Admitting: *Deleted

## 2015-12-27 DIAGNOSIS — I10 Essential (primary) hypertension: Secondary | ICD-10-CM | POA: Insufficient documentation

## 2015-12-27 DIAGNOSIS — Y838 Other surgical procedures as the cause of abnormal reaction of the patient, or of later complication, without mention of misadventure at the time of the procedure: Secondary | ICD-10-CM | POA: Insufficient documentation

## 2015-12-27 DIAGNOSIS — I2510 Atherosclerotic heart disease of native coronary artery without angina pectoris: Secondary | ICD-10-CM | POA: Insufficient documentation

## 2015-12-27 DIAGNOSIS — N182 Chronic kidney disease, stage 2 (mild): Secondary | ICD-10-CM

## 2015-12-27 DIAGNOSIS — Y92538 Other ambulatory health services establishments as the place of occurrence of the external cause: Secondary | ICD-10-CM | POA: Diagnosis not present

## 2015-12-27 DIAGNOSIS — Z87891 Personal history of nicotine dependence: Secondary | ICD-10-CM | POA: Diagnosis not present

## 2015-12-27 DIAGNOSIS — K449 Diaphragmatic hernia without obstruction or gangrene: Secondary | ICD-10-CM | POA: Diagnosis not present

## 2015-12-27 DIAGNOSIS — D649 Anemia, unspecified: Secondary | ICD-10-CM | POA: Diagnosis not present

## 2015-12-27 DIAGNOSIS — K9161 Intraoperative hemorrhage and hematoma of a digestive system organ or structure complicating a digestive sytem procedure: Secondary | ICD-10-CM | POA: Diagnosis not present

## 2015-12-27 DIAGNOSIS — K31819 Angiodysplasia of stomach and duodenum without bleeding: Secondary | ICD-10-CM | POA: Diagnosis not present

## 2015-12-27 DIAGNOSIS — N289 Disorder of kidney and ureter, unspecified: Secondary | ICD-10-CM | POA: Diagnosis not present

## 2015-12-27 DIAGNOSIS — I739 Peripheral vascular disease, unspecified: Secondary | ICD-10-CM | POA: Diagnosis not present

## 2015-12-27 DIAGNOSIS — K922 Gastrointestinal hemorrhage, unspecified: Secondary | ICD-10-CM | POA: Diagnosis present

## 2015-12-27 DIAGNOSIS — K219 Gastro-esophageal reflux disease without esophagitis: Secondary | ICD-10-CM | POA: Diagnosis not present

## 2015-12-27 DIAGNOSIS — D509 Iron deficiency anemia, unspecified: Secondary | ICD-10-CM

## 2015-12-27 DIAGNOSIS — Z955 Presence of coronary angioplasty implant and graft: Secondary | ICD-10-CM | POA: Insufficient documentation

## 2015-12-27 DIAGNOSIS — Z79899 Other long term (current) drug therapy: Secondary | ICD-10-CM | POA: Insufficient documentation

## 2015-12-27 DIAGNOSIS — K2940 Chronic atrophic gastritis without bleeding: Secondary | ICD-10-CM | POA: Insufficient documentation

## 2015-12-27 DIAGNOSIS — I252 Old myocardial infarction: Secondary | ICD-10-CM | POA: Diagnosis not present

## 2015-12-27 DIAGNOSIS — J45909 Unspecified asthma, uncomplicated: Secondary | ICD-10-CM | POA: Diagnosis not present

## 2015-12-27 HISTORY — PX: HOT HEMOSTASIS: SHX5433

## 2015-12-27 HISTORY — DX: Personal history of other medical treatment: Z92.89

## 2015-12-27 HISTORY — PX: ENTEROSCOPY: SHX5533

## 2015-12-27 LAB — BASIC METABOLIC PANEL
ANION GAP: 5 (ref 5–15)
BUN: 22 mg/dL — ABNORMAL HIGH (ref 6–20)
CHLORIDE: 110 mmol/L (ref 101–111)
CO2: 24 mmol/L (ref 22–32)
Calcium: 8.6 mg/dL — ABNORMAL LOW (ref 8.9–10.3)
Creatinine, Ser: 1.06 mg/dL — ABNORMAL HIGH (ref 0.44–1.00)
GFR calc Af Amer: >60 mL/min (ref >60)
GFR calc non Af Amer: 52 mL/min — ABNORMAL LOW (ref >60)
GLUCOSE: 95 mg/dL (ref 65–99)
POTASSIUM: 5.3 mmol/L — AB (ref 3.5–5.1)
Sodium: 139 mmol/L (ref 135–145)

## 2015-12-27 LAB — CBC
HEMATOCRIT: 32.8 % — AB (ref 36.0–46.0)
HEMOGLOBIN: 10.2 g/dL — AB (ref 12.0–15.0)
MCH: 25.8 pg — ABNORMAL LOW (ref 26.0–34.0)
MCHC: 31.1 g/dL (ref 30.0–36.0)
MCV: 83 fL (ref 78.0–100.0)
Platelets: 142 10*3/uL — ABNORMAL LOW (ref 150–400)
RBC: 3.95 MIL/uL (ref 3.87–5.11)
RDW: 20.5 % — AB (ref 11.5–15.5)
WBC: 4.8 10*3/uL (ref 4.0–10.5)

## 2015-12-27 SURGERY — ENTEROSCOPY
Anesthesia: Monitor Anesthesia Care

## 2015-12-27 MED ORDER — SODIUM CHLORIDE 0.9 % IV SOLN: INTRAVENOUS | Status: DC

## 2015-12-27 MED ORDER — PROPOFOL 10 MG/ML IV BOLUS
INTRAVENOUS | Status: DC | PRN
  Administered 2015-12-27 (×3): 20 mg via INTRAVENOUS

## 2015-12-27 MED ORDER — LIDOCAINE 2% (20 MG/ML) 5 ML SYRINGE
INTRAMUSCULAR | Status: DC | PRN
  Administered 2015-12-27: 75 mg via INTRAVENOUS

## 2015-12-27 MED ORDER — PROPOFOL 500 MG/50ML IV EMUL
INTRAVENOUS | Status: DC | PRN
  Administered 2015-12-27: 140 ug/kg/min via INTRAVENOUS

## 2015-12-27 MED ORDER — PROPOFOL 10 MG/ML IV BOLUS
INTRAVENOUS | Status: AC
  Filled 2015-12-27: qty 40

## 2015-12-27 MED ORDER — LIDOCAINE 2% (20 MG/ML) 5 ML SYRINGE
INTRAMUSCULAR | Status: AC
  Filled 2015-12-27: qty 5

## 2015-12-27 MED ORDER — LACTATED RINGERS IV SOLN
INTRAVENOUS | Status: DC
  Administered 2015-12-27: 11:00:00 via INTRAVENOUS

## 2015-12-27 NOTE — Anesthesia Postprocedure Evaluation (Signed)
Anesthesia Post Note  Patient: Kayla Mcclure  Procedure(s) Performed: Procedure(s) (LRB): ENTEROSCOPY (N/A) HOT HEMOSTASIS (ARGON PLASMA COAGULATION/BICAP) (N/A)  Patient location during evaluation: PACU Anesthesia Type: MAC Level of consciousness: awake and alert Pain management: pain level controlled Vital Signs Assessment: post-procedure vital signs reviewed and stable Respiratory status: spontaneous breathing, nonlabored ventilation, respiratory function stable and patient connected to nasal cannula oxygen Cardiovascular status: stable and blood pressure returned to baseline Anesthetic complications: no    Last Vitals:  Vitals:   12/27/15 1350 12/27/15 1400  BP: (!) 127/57 (!) 128/53  Pulse: 66 70  Resp: 17 15  Temp:      Last Pain:  Vitals:   12/27/15 1336  TempSrc: Oral                 Davine Sweney J

## 2015-12-27 NOTE — Anesthesia Preprocedure Evaluation (Addendum)
Anesthesia Evaluation  Patient identified by MRN, date of birth, ID band Patient awake    Reviewed: Allergy & Precautions, NPO status , Patient's Chart, lab work & pertinent test results  Airway Mallampati: II  TM Distance: >3 FB Neck ROM: Full    Dental no notable dental hx.    Pulmonary shortness of breath, asthma , former smoker,    Pulmonary exam normal breath sounds clear to auscultation       Cardiovascular hypertension, + CAD, + Past MI, + Peripheral Vascular Disease and + DOE  Normal cardiovascular exam Rhythm:Regular Rate:Normal  ECHO 09-21-15: Study Conclusions  - Left ventricle: The cavity size was normal. Systolic function was   normal. The estimated ejection fraction was in the range of 60%   to 65%. Wall motion was normal; there were no regional wall   motion abnormalities. Doppler parameters are consistent with   abnormal left ventricular relaxation (grade 1 diastolic   dysfunction). Doppler parameters are consistent with elevated   ventricular end-diastolic filling pressure. - Aortic valve: There was mild regurgitation. - Mitral valve: Calcified annulus.   Neuro/Psych negative neurological ROS  negative psych ROS   GI/Hepatic Neg liver ROS, GERD  ,  Endo/Other  negative endocrine ROS  Renal/GU Renal InsufficiencyRenal diseaseCr 1.32 K 4.5  negative genitourinary   Musculoskeletal negative musculoskeletal ROS (+)   Abdominal   Peds negative pediatric ROS (+)  Hematology  (+) anemia ,   Anesthesia Other Findings   Reproductive/Obstetrics negative OB ROS                            Anesthesia Physical Anesthesia Plan  ASA: III  Anesthesia Plan: MAC   Post-op Pain Management:    Induction: Intravenous  Airway Management Planned: Natural Airway  Additional Equipment:   Intra-op Plan:   Post-operative Plan:   Informed Consent: I have reviewed the patients  History and Physical, chart, labs and discussed the procedure including the risks, benefits and alternatives for the proposed anesthesia with the patient or authorized representative who has indicated his/her understanding and acceptance.   Dental advisory given  Plan Discussed with: CRNA  Anesthesia Plan Comments:         Anesthesia Quick Evaluation

## 2015-12-27 NOTE — Telephone Encounter (Signed)
Forward to DR [facility]

## 2015-12-27 NOTE — Telephone Encounter (Signed)
New message    Pamala Hurry calling stating that the pt is on a blood thinner and an aspirin and that she has GI bleed. Furthermore, when they are able to take her off Plavix she does better.   They are looking for a happy medium so she does not bleed out or have a stroke. So the doc wants to know the lowest does from a cardiology stand point that the pt can take so she won't keep bleeding?   Feel free to call the nurse or the doc. She expressed that Dr. Ellyn Hack can call Dr. Watt Climes at [phone].

## 2015-12-27 NOTE — Discharge Instructions (Signed)
YOU HAD AN ENDOSCOPIC PROCEDURE TODAY: Refer to the procedure report and other information in the discharge instructions given to you for any specific questions about what was found during the examination. If this information does not answer your questions, please call [facility] office at [phone] to clarify.   YOU SHOULD EXPECT: Some feelings of bloating in the abdomen. Passage of more gas than usual. Walking can help get rid of the air that was put into your GI tract during the procedure and reduce the bloating. If you had a lower endoscopy (such as a colonoscopy or flexible sigmoidoscopy) you may notice spotting of blood in your stool or on the toilet paper. Some abdominal soreness may be present for a day or two, also.  DIET: Your first meal following the procedure should be a light meal and then it is ok to progress to your normal diet. A half-sandwich or bowl of soup is an example of a good first meal. Heavy or fried foods are harder to digest and may make you feel nauseous or bloated. Drink plenty of fluids but you should avoid alcoholic beverages for 24 hours. If you had a esophageal dilation, please see attached instructions for diet.   ACTIVITY: Your care partner should take you home directly after the procedure. You should plan to take it easy, moving slowly for the rest of the day. You can resume normal activity the day after the procedure however YOU SHOULD NOT DRIVE, use power tools, machinery or perform tasks that involve climbing or major physical exertion for 24 hours (because of the sedation medicines used during the test).   SYMPTOMS TO REPORT IMMEDIATELY: A gastroenterologist can be reached at any hour. Please call [phone]  for any of the following symptoms:  Following lower endoscopy (colonoscopy, flexible sigmoidoscopy) Excessive amounts of blood in the stool  Significant tenderness, worsening of abdominal pains  Swelling of the abdomen that is new, acute  Fever of 100 or  higher  Following upper endoscopy (EGD, EUS, ERCP, esophageal dilation) Vomiting of blood or coffee ground material  New, significant abdominal pain  New, significant chest pain or pain under the shoulder blades  Painful or persistently difficult swallowing  New shortness of breath  Black, tarry-looking or red, bloody stools  FOLLOW UP:  If any biopsies were taken you will be contacted by phone or by letter within the next 1-3 weeks. Call [phone]  if you have not heard about the biopsies in 3 weeks.  Please also call with any specific questions about appointments or follow up tests.   Clear liquids only until 5 PM then may have soft solids and then may advance diet if doing well and call if question or problem otherwise follow-up in one month and check with kidney Dr. primary doctor and cardiologist regarding lowest dose of blood thinner needs

## 2015-12-27 NOTE — Transfer of Care (Signed)
Immediate Anesthesia Transfer of Care Note  Patient: Kayla Mcclure  Procedure(s) Performed: Procedure(s) with comments: ENTEROSCOPY (N/A) - also needs slim egd scope HOT HEMOSTASIS (ARGON PLASMA COAGULATION/BICAP) (N/A)  Patient Location: Endoscopy Unit  Anesthesia Type:MAC  Level of Consciousness: awake  Airway & Oxygen Therapy: Patient Spontanous Breathing and Patient connected to nasal cannula oxygen  Post-op Assessment: Report given to RN and Post -op Vital signs reviewed and stable  Post vital signs: Reviewed and stable  Last Vitals:  Vitals:   12/27/15 1107  BP: (!) 120/53  Pulse: 67  Resp: (!) 22  Temp: 36.4 C    Last Pain:  Vitals:   12/27/15 1107  TempSrc: Oral         Complications: No apparent anesthesia complications

## 2015-12-27 NOTE — Op Note (Signed)
[facility] Patient Name: Kayla Mcclure Procedure Date: 12/27/2015 MRN: [id number] Attending MD: Clarene Essex , MD Date of Birth: April 11, 1945 CSN: [id number] Age: 70 Admit Type: Outpatient Procedure:                Small bowel enteroscopy Indications:              Obscure gastrointestinal bleeding seen on capsule                            endoscopy Providers:                Clarene Essex, MD, Cleda Daub, RN, Elspeth Cho                            Tech., Technician, Danley Danker, CRNA Referring MD:              Medicines:                Propofol total dose 225 mg IV75 mg IV lidocaine Complications:            Minor hemorrhage - stopped spontaneously from scope                            trauma to the stomach Estimated Blood Loss:     Estimated blood loss was minimal. Procedure:                Pre-Anesthesia Assessment:                           - Prior to the procedure, a History and Physical                            was performed, and patient medications and                            allergies were reviewed. The patient's tolerance of                            previous anesthesia was also reviewed. The risks                            and benefits of the procedure and the sedation                            options and risks were discussed with the patient.                            All questions were answered, and informed consent                            was obtained. Prior Anticoagulants: The patient has                            taken no previous anticoagulant or antiplatelet  agents. ASA Grade Assessment: II - A patient with                            mild systemic disease. After reviewing the risks                            and benefits, the patient was deemed in                            satisfactory condition to undergo the procedure.                           After obtaining informed consent, the endoscope was                  passed under direct vision. Throughout the                            procedure, the patient's blood pressure, pulse, and                            oxygen saturations were monitored continuously. The                            EC-[id number] ([id number]) scope was introduced through                            the mouth and advanced to the jejunum, to the 160                            cm mark (from the incisors). The small bowel                            enteroscopy was somewhat difficult. The patient                            tolerated the procedure well. Scope In: Scope Out: Findings:      A small hiatal hernia was present.      Diffuse mild inflammation characterized by congestion (edema) and       friability was found in the entire examined stomach.      There was no evidence of significant pathology in the duodenal bulb, in       the first portion of the duodenum, in the second portion of the       duodenum, in the third portion of the duodenum and in the fourth portion       of the duodenum.      Three angiodysplastic lesions with no bleeding were found in the       proximal jejunum, in the mid-jejunum and in the distal jejunum.       Fulguration to ablate the lesion to prevent bleeding by argon plasma at       1 liter/minute and 20 watts was successful. otherwise within normal       limits to 160 cm Impression:               - Small  hiatal hernia.                           - Atrophic gastritis.                           - Normal duodenal bulb, first portion of the                            duodenum, second portion of the duodenum, third                            portion of the duodenum and fourth portion of the                            duodenum.                           - Three non-bleeding angiodysplastic lesions in the                            duodenum. Treated with argon plasma coagulation                            (APC).                           - No  specimens collected. Moderate Sedation:      Moderate (conscious) sedation was [Sedation Professional]. [Parameters       Monitored]. [Procedure Duration Time].      N/A- Per Anesthesia Care Recommendation:           - Clear liquid diet for 4 hours. then soft solids                           - Continue present medications.                           - Return to GI clinic in 4 weeks.                           - Telephone GI clinic if symptomatic PRN.                           - Check hemogram with white blood cell count and                            platelets today as well as metabolic profile.                            discuss with cardiology the lowest dose of blood                            thinners required by patient to decrease risk of  future events Procedure Code(s):        --- Professional ---                           [phone], Small intestinal endoscopy, enteroscopy                            beyond second portion of duodenum, not including                            ileum; with control of bleeding (eg, injection,                            bipolar cautery, unipolar cautery, laser, heater                            probe, stapler, plasma coagulator) Diagnosis Code(s):        --- Professional ---                           K44.9, Diaphragmatic hernia without obstruction or                            gangrene                           K29.40, Chronic atrophic gastritis without bleeding                           K31.819, Angiodysplasia of stomach and duodenum                            without bleeding CPT copyright 2016 [employer]. All rights reserved. The codes documented in this report are preliminary and upon coder review may  be revised to meet current compliance requirements. Clarene Essex, MD 12/27/2015 1:35:48 PM This report has been signed electronically. Number of Addenda: 0

## 2015-12-27 NOTE — Progress Notes (Signed)
Kayla Mcclure 12:54 PM  Subjective: The patient has no signs of bleeding we discussed her recent hospital stay discussed MiraLAX handed and answered all of her husband's questions as well and reviewed her capsule endoscopy  Objective: Vital signs stable afebrile no acute distress exam please see preassessment evaluation  Assessment: Recurrent GI bleeding secondary to small bowel AVMs secondary to aspirin and Plavix  Plan: Okay to proceed with enteroscope with anesthesia assistance in the future we will need to decide for bare minimum of blood thinners she needs to prevent CNS or cardiac issues and that will hopefully decrease GI blood loss  Jonatha Gagen E  Pager [phone] After 5PM or if no answer call [phone]

## 2015-12-27 NOTE — Telephone Encounter (Signed)
When I last saw her, she was still not on aspirin plus Plavix.  At this point, I would prefer to just have her on aspirin once safe from a GI standpoint. If she is not having any more active anginal symptoms, we can just stay off Plavix for now.  FYI: She is not on a blood thinner. Plavix as antiplatelet and not blood thinner   [name], MD

## 2015-12-28 ENCOUNTER — Encounter (HOSPITAL_COMMUNITY): Payer: Self-pay | Admitting: Gastroenterology

## 2015-12-28 NOTE — Telephone Encounter (Signed)
Spoke to [place]. Informed information is in epicto review

## 2016-06-14 NOTE — Progress Notes (Signed)
[facility]    Provider:  Dr Jaynee Eagles Referring Provider: Antony Blackbird, MD Primary Care Physician:  Antony Blackbird, MD  CC:  Right hand weakness and arm pain  HPI:  Kayla Mcclure is a 71 y.o. female here as a referral from Dr. Chapman Fitch for right hand weakness and arm pain, CTS versus cervical radiculopathy. Past medical history of asthma, hypertension, hypercholesterolemia, chronic kidney disease, peripheral vascular disease status post stent in the left leg, femoral artery stent, cardiac stent placement 2009.Right hand pain and weakness for a month. Weakness has been a lot longer than that, couldn't open a bag of chips. Also neck pain and pain into the upper arm. Digits 1-3 tingle and are numb. If she takes the wrist brace off it starts to hurt and get weak and numb and she tries to shake it out. Worse without the wrist splint. Better with the splint. Can be severe pain.Worsening in the last month.  No other focal neurologic deficits, associated symptoms, inciting events or modifiable factors. Left hand is fine  Reviewed notes, labs and imaging from outside physicians, which showed:  Reviewed notes from primary care. She mainly complains of some pain that goes down the right arm and some weakness and clumsiness with her right hand and she's noticed over the past week at least. The pain does start of the neck and she complained of some what last week. She denies any definitive numbness but does feel numbness  CBC showed hemoglobin 11.5, hematocrit 36.2, MCV 77.9.  Personally reviewed images and agree with the following 2013:  CT HEAD  Findings: There is no evidence of acute infarction, mass lesion, or intra- or extra-axial hemorrhage on CT.  Minimal periventricular and subcortical white matter change likely reflects small vessel ischemic microangiopathy.  The posterior fossa, including the cerebellum, brainstem and fourth ventricle, is within normal limits.  The third  and lateral ventricles, and basal ganglia are unremarkable in appearance.  The cerebral hemispheres are symmetric in appearance, with normal gray- white differentiation.  No mass effect or midline shift is seen.  There is no evidence of fracture; visualized osseous structures are unremarkable in appearance.  The orbits are within normal limits. The paranasal sinuses and mastoid air cells are well-aerated.  No significant soft tissue abnormalities are seen.  IMPRESSION:  1.  No evidence of traumatic intracranial injury or fracture. 2.  Mild small vessel ischemic microangiopathy.  CT CERVICAL SPINE  Findings: There is no evidence of fracture or subluxation. Vertebral bodies demonstrate normal height and alignment.  Mild multilevel disc space narrowing is noted along the cervical spine, with associated scattered anterior and posterior disc osteophyte complexes.  Prevertebral soft tissues are within normal limits.  The thyroid gland is unremarkable in appearance.  The visualized lung apices are clear.  Calcification is noted at the carotid bifurcations bilaterally; this is slightly more prominent on the right.  Carotid ultrasound would be helpful for further evaluation, when and as deemed clinically appropriate.  IMPRESSION:  1.  No evidence of fracture or subluxation along the cervical spine.  No acute mild degenerative change noted along the cervical spine. 3.  Calcification at the carotid bifurcations bilaterally, slightly more prominent on the right.  Carotid ultrasound would be helpful for further evaluation, when and as deemed clinically appropriate.   Review of Systems: Patient complains of symptoms per HPI as well as the following symptoms. Pertinent negatives per HPI. All others negative.   Social History   Social History  .  Marital status: Married    Spouse name: N/A  . Number of children: 1  . Years of education: N/A   Occupational History  .   Retired   Social History Main Topics  . Smoking status: Former Smoker    Types: Cigarettes    Quit date: 05/04/2013  . Smokeless tobacco: Never Used  . Alcohol use Yes     Comment: occasionally 3 times a year  . Drug use: No  . Sexual activity: No   Other Topics Concern  . Not on file   Social History Narrative   Married. Has one child. Grandmother for a great-grandmother and 1.   No real exercise.   Occasional alcohol consumption.   Quit smoking inf Feb 2015 -- after L Iliac Stent   Right-handed   Caffeine: 1 cup every morning    Family History  Problem Relation Age of Onset  . Diabetes Mother   . Heart disease Mother   . Heart attack Mother 67  . Breast cancer Mother 68  . Colon cancer Mother 46  . Heart attack Father 67  . Pneumonia Father   . Diabetes Sister   . Breast cancer Sister 102    Past Medical History:  Diagnosis Date  . Asthma  10/31/2007  . CAD (coronary artery disease) 08/05/2007   a) NSTEMI 2009: PCI to the RCA Promus DES 2.5 mm x 23 mm (3.0 mm). b) Myoview June 2017: Nonspecific ST changes. EF greater than 65%. LOW RISK, normal study. No ischemia or infarction.  . Carotid artery disease ([place])    a. 50-69% RICA by outpt dopp 04/22/13, f/u due 04/2014.  Marland Kitchen Chronic back pain   . CKD (chronic kidney disease), stage II   . Diverticulosis   04/11/2010    Colonoscopy  . Dyslipidemia, goal LDL below 70   . Essential hypertension    a. Normal renal arteries by PV angio 05/2013.  Marland Kitchen GERD (gastroesophageal reflux disease)   . GI bleed 12/2015  . History of recent blood transfusion 12-19-15, 12-20-15 and 12-21-15  . Insomnia   . Internal hemorrhoids  04/11/2010   By colonoscopy  . Iron deficiency anemia   . Myocardial infarction 2009  . Peripheral arterial disease ([place])    a. s/p PTA and stenting of L CIA stenosis 05/2013.  Marland Kitchen Renal insufficiency   . Tobacco abuse    Quit in 05/2013    Past Surgical History:  Procedure Laterality Date  . CHOLECYSTECTOMY   1975  . COLONOSCOPY  04/11/2010  . colonscopy  11/30/2015  . CORONARY ANGIOPLASTY WITH STENT PLACEMENT  08/05/2007   Promus DES 2.5 mm x 23 mm (3 mm)  to RCA; EF 60-70%  . ENTEROSCOPY N/A 12/27/2015   Procedure: ENTEROSCOPY;  Surgeon: Clarene Essex, MD;  Location: [facility] ENDOSCOPY;  Service: Endoscopy;  Laterality: N/A;  also needs slim egd scope  . ESOPHAGOGASTRODUODENOSCOPY N/A 09/13/2013   Procedure: ESOPHAGOGASTRODUODENOSCOPY (EGD);  Surgeon: Lear Ng, MD;  Location: [facility] ENDOSCOPY;  Service: Endoscopy;  Laterality: N/A;  . ESOPHAGOGASTRODUODENOSCOPY N/A 07/14/2015   Procedure: ESOPHAGOGASTRODUODENOSCOPY (EGD);  Surgeon: Clarene Essex, MD;  Location: [facility] ENDOSCOPY;  Service: Endoscopy;  Laterality: N/A;  . gi bleed  07/2015   blood given  . HOT HEMOSTASIS N/A 12/27/2015   Procedure: HOT HEMOSTASIS (ARGON PLASMA COAGULATION/BICAP);  Surgeon: Clarene Essex, MD;  Location: [facility] ENDOSCOPY;  Service: Endoscopy;  Laterality: N/A;  . ILIAC ARTERY STENT Left 05/04/2013   L CIA-EIA -- Dr. Gwenlyn Found  . LOWER  EXTREMITY ANGIOGRAM Bilateral 05/04/2013   Procedure: LOWER EXTREMITY ANGIOGRAM;  Surgeon: Lorretta Harp, MD;  Location: [facility] CATH LAB;  Service: Cardiovascular;  Laterality: Bilateral;  . NM MYOVIEW LTD  09/07/2015   EF > 65%. Nonspecific ST changes but no ischemic changes. No ischemia or infarction. LOW RISK  . TONSILLECTOMY  1960's  . TRANSTHORACIC ECHOCARDIOGRAM  09/21/2015   EF 60 deficits 5%. GR 1 DD. No regional wall motion analysis. No valve disease.  Marland Kitchen UPPER GI ENDOSCOPY  12/14/2015    Current Outpatient Prescriptions  Medication Sig Dispense Refill  . albuterol (PROVENTIL HFA;VENTOLIN HFA) 108 (90 BASE) MCG/ACT inhaler Inhale 2 puffs into the lungs every 4 (four) hours as needed. For shortness of breath    . amLODipine (NORVASC) 5 MG tablet Take 5 mg by mouth daily.    Marland Kitchen aspirin EC 81 MG tablet Take 81 mg by mouth daily.    Marland Kitchen atorvastatin (LIPITOR) 40 MG tablet Take 40 mg by mouth daily.    .  clopidogrel (PLAVIX) 75 MG tablet Take 75 mg by mouth daily.    . ferrous gluconate (FERGON) 324 MG tablet Take 1 tablet (324 mg total) by mouth 2 (two) times daily with a meal. (Patient taking differently: Take 324 mg by mouth 3 (three) times daily. ) 60 tablet 3  . Fluticasone-Salmeterol (ADVAIR) 100-50 MCG/DOSE AEPB Inhale 1 puff into the lungs 2 (two) times daily as needed (shortness of breath).    . hydrochlorothiazide (HYDRODIURIL) 25 MG tablet Take 25 mg by mouth daily.    . isosorbide mononitrate (IMDUR) 60 MG 24 hr tablet Take 1.5 tablets (90 mg total) by mouth daily. 135 tablet 3  . metoprolol succinate (TOPROL-XL) 100 MG 24 hr tablet Take 150 mg by mouth daily. Take with or immediately following a meal.    . pantoprazole (PROTONIX) 40 MG tablet Take 1 tablet (40 mg total) by mouth daily. 30 tablet 0  . gabapentin (NEURONTIN) 300 MG capsule Take 1 capsule (300 mg total) by mouth 3 (three) times daily. 90 capsule 11  . loratadine (CLARITIN) 10 MG tablet Take 10 mg by mouth daily as needed for allergies.    . nitroGLYCERIN (NITROSTAT) 0.4 MG SL tablet Place 1 tablet (0.4 mg total) under the tongue every 5 (five) minutes as needed for chest pain. (Patient not taking: Reported on 06/15/2016) 25 tablet 6   No current facility-administered medications for this visit.     Allergies as of 06/15/2016  . (No Known Allergies)    Vitals: BP 120/76   Pulse 86   Ht 4\' 11"  (1.499 m)   Wt 147 lb 6.4 oz (66.9 kg)   BMI 29.77 kg/m  Last Weight:  Wt Readings from Last 1 Encounters:  06/15/16 147 lb 6.4 oz (66.9 kg)   Last Height:   Ht Readings from Last 1 Encounters:  06/15/16 4\' 11"  (1.499 m)   Physical exam: Exam: Gen: NAD, conversant, well nourised, obese, well groomed                     CV: RRR, no MRG. No Carotid Bruits. No peripheral edema, warm, nontender Eyes: Conjunctivae clear without exudates or hemorrhage  Neuro: Detailed Neurologic Exam  Speech:    Speech is normal;  fluent and spontaneous with normal comprehension.  Cognition:    The patient is oriented to person, place, and time;     recent and remote memory intact;     language fluent;  normal attention, concentration,     fund of knowledge Cranial Nerves:    The pupils are equal, round, and reactive to light. The fundi are normal and spontaneous venous pulsations are present. Visual fields are full to finger confrontation. Extraocular movements are intact. Trigeminal sensation is intact and the muscles of mastication are normal. The face is symmetric. The palate elevates in the midline. Hearing intact. Voice is normal. Shoulder shrug is normal. The tongue has normal motion without fasciculations.   Coordination:    Normal finger to nose and heel to shin. Normal rapid alternating movements.   Gait:    Heel-toe and tandem gait are normal.   Motor Observation:    No asymmetry, no atrophy, and no involuntary movements noted. Tone:    Normal muscle tone.    Posture:    Posture is normal. normal erect    Strength:    Strength is V/V in the upper and lower limbs.      Sensation: Decreased sensation in the right hand in a median distribution     Reflex Exam:  DTR's:    Deep tendon reflexes in the upper and lower extremities are normal bilaterally.   Toes:    The toes are downgoing bilaterally.   Clonus:    Clonus is absent.  +Tinel's sign right wrist    Assessment/Plan:  71 year old with likely right carpal tunnel syndrome but also need to eval for cervical radiculopathy. EMG/NCS of the bilateral upper extremities.   Cc: Dr. Vanetta Shawl, MD  [facility] [address] [place], [place] [address]  Phone [phone] Fax [phone]

## 2016-06-15 ENCOUNTER — Ambulatory Visit (INDEPENDENT_AMBULATORY_CARE_PROVIDER_SITE_OTHER): Payer: Medicare Other | Admitting: Neurology

## 2016-06-15 ENCOUNTER — Encounter: Payer: Self-pay | Admitting: Neurology

## 2016-06-15 VITALS — BP 120/76 | HR 86 | Ht 59.0 in | Wt 147.4 lb

## 2016-06-15 DIAGNOSIS — G5601 Carpal tunnel syndrome, right upper limb: Secondary | ICD-10-CM | POA: Diagnosis not present

## 2016-06-15 MED ORDER — GABAPENTIN 300 MG PO CAPS: 300.0000 mg | ORAL_CAPSULE | Freq: Three times a day (TID) | ORAL | 11 refills | Status: DC

## 2016-06-15 NOTE — Patient Instructions (Signed)
Remember to drink plenty of fluid, eat healthy meals and do not skip any meals. Try to eat protein with a every meal and eat a healthy snack such as fruit or nuts in between meals. Try to keep a regular sleep-wake schedule and try to exercise daily, particularly in the form of walking, 20-30 minutes a day, if you can.   As far as your medications are concerned, I would like to suggest: Gabapentin 3x a day  As far as diagnostic testing: emg/ncs  Our phone number is [phone]. We also have an after hours call service for urgent matters and there is a physician on-call for urgent questions. For any emergencies you know to call 911 or go to the nearest emergency room  Gabapentin capsules or tablets What is this medicine? GABAPENTIN (GA ba pen tin) is used to control partial seizures in adults with epilepsy. It is also used to treat certain types of nerve pain. This medicine may be used for other purposes; ask your health care provider or pharmacist if you have questions. COMMON BRAND NAME(S): Active-PAC with Gabapentin, Gabarone, Neurontin What should I tell my health care provider before I take this medicine? They need to know if you have any of these conditions: -kidney disease -suicidal thoughts, plans, or attempt; a previous suicide attempt by you or a family member -an unusual or allergic reaction to gabapentin, other medicines, foods, dyes, or preservatives -pregnant or trying to get pregnant -breast-feeding How should I use this medicine? Take this medicine by mouth with a glass of water. Follow the directions on the prescription label. You can take it with or without food. If it upsets your stomach, take it with food.Take your medicine at regular intervals. Do not take it more often than directed. Do not stop taking except on your doctor's advice. If you are directed to break the 600 or 800 mg tablets in half as part of your dose, the extra half tablet should be used for the next dose.  If you have not used the extra half tablet within 28 days, it should be thrown away. A special MedGuide will be given to you by the pharmacist with each prescription and refill. Be sure to read this information carefully each time. Talk to your pediatrician regarding the use of this medicine in children. Special care may be needed. Overdosage: If you think you have taken too much of this medicine contact a poison control center or emergency room at once. NOTE: This medicine is only for you. Do not share this medicine with others. What if I miss a dose? If you miss a dose, take it as soon as you can. If it is almost time for your next dose, take only that dose. Do not take double or extra doses. What may interact with this medicine? Do not take this medicine with any of the following medications: -other gabapentin products This medicine may also interact with the following medications: -alcohol -antacids -antihistamines for allergy, cough and cold -certain medicines for anxiety or sleep -certain medicines for depression or psychotic disturbances -homatropine; hydrocodone -naproxen -narcotic medicines (opiates) for pain -phenothiazines like chlorpromazine, mesoridazine, prochlorperazine, thioridazine This list may not describe all possible interactions. Give your health care provider a list of all the medicines, herbs, non-prescription drugs, or dietary supplements you use. Also tell them if you smoke, drink alcohol, or use illegal drugs. Some items may interact with your medicine. What should I watch for while using this medicine? Visit your doctor or health  care professional for regular checks on your progress. You may want to keep a record at home of how you feel your condition is responding to treatment. You may want to share this information with your doctor or health care professional at each visit. You should contact your doctor or health care professional if your seizures get worse or if  you have any new types of seizures. Do not stop taking this medicine or any of your seizure medicines unless instructed by your doctor or health care professional. Stopping your medicine suddenly can increase your seizures or their severity. Wear a medical identification bracelet or chain if you are taking this medicine for seizures, and carry a card that lists all your medications. You may get drowsy, dizzy, or have blurred vision. Do not drive, use machinery, or do anything that needs mental alertness until you know how this medicine affects you. To reduce dizzy or fainting spells, do not sit or stand up quickly, especially if you are an older patient. Alcohol can increase drowsiness and dizziness. Avoid alcoholic drinks. Your mouth may get dry. Chewing sugarless gum or sucking hard candy, and drinking plenty of water will help. The use of this medicine may increase the chance of suicidal thoughts or actions. Pay special attention to how you are responding while on this medicine. Any worsening of mood, or thoughts of suicide or dying should be reported to your health care professional right away. Women who become pregnant while using this medicine may enroll in the [place] Pregnancy Registry by calling [phone]. This registry collects information about the safety of antiepileptic drug use during pregnancy. What side effects may I notice from receiving this medicine? Side effects that you should report to your doctor or health care professional as soon as possible: -allergic reactions like skin rash, itching or hives, swelling of the face, lips, or tongue -worsening of mood, thoughts or actions of suicide or dying Side effects that usually do not require medical attention (report to your doctor or health care professional if they continue or are bothersome): -constipation -difficulty walking or controlling muscle movements -dizziness -nausea -slurred  speech -tiredness -tremors -weight gain This list may not describe all possible side effects. Call your doctor for medical advice about side effects. You may report side effects to FDA at 1-800-FDA-1088. Where should I keep my medicine? Keep out of reach of children. This medicine may cause accidental overdose and death if it taken by other adults, children, or pets. Mix any unused medicine with a substance like cat litter or coffee grounds. Then throw the medicine away in a sealed container like a sealed bag or a coffee can with a lid. Do not use the medicine after the expiration date. Store at room temperature between 15 and 30 degrees C (59 and 86 degrees F). NOTE: This sheet is a summary. It may not cover all possible information. If you have questions about this medicine, talk to your doctor, pharmacist, or health care provider.  2018 Elsevier/Gold Standard (2013-05-15 15:26:50)

## 2016-06-20 ENCOUNTER — Telehealth: Payer: Self-pay | Admitting: Neurology

## 2016-06-20 MED ORDER — GABAPENTIN 300 MG PO CAPS: 300.0000 mg | ORAL_CAPSULE | Freq: Two times a day (BID) | ORAL | 11 refills | Status: DC

## 2016-06-20 NOTE — Telephone Encounter (Signed)
Pt was seen yesterday for symptoms of carpal tunnel, c/o R hand weakness and arm pain. Gabapentin was ordered. She has NCV/EMG scheduled 06/27/16.

## 2016-06-20 NOTE — Addendum Note (Signed)
Addended by: Monte Fantasia on: 06/20/2016 05:16 PM   Modules accepted: Orders

## 2016-06-20 NOTE — Telephone Encounter (Signed)
Pt called said she cannot take gabapentin (NEURONTIN) 300 MG capsule because she is in stage 3 renal failure. She was reading the pt information.

## 2016-06-20 NOTE — Telephone Encounter (Signed)
Actually she is fine to take it at that dose thanks

## 2016-06-20 NOTE — Telephone Encounter (Signed)
Discussed w/ Dr. Jaynee Eagles. Last BUN was 22 and Creat was 1.06 w/ a GFR of 52. Pt can safely take up to 700 mg of gabapentin per day. Rx changed to 300 mg BID. Called and discussed w/ pt who verbalized understanding and appreciation for call.

## 2016-06-21 ENCOUNTER — Encounter: Payer: Self-pay | Admitting: Cardiology

## 2016-06-21 ENCOUNTER — Ambulatory Visit (INDEPENDENT_AMBULATORY_CARE_PROVIDER_SITE_OTHER): Payer: Medicare Other | Admitting: Cardiology

## 2016-06-21 VITALS — BP 95/53 | HR 78 | Ht 59.0 in | Wt 147.4 lb

## 2016-06-21 DIAGNOSIS — I779 Disorder of arteries and arterioles, unspecified: Secondary | ICD-10-CM

## 2016-06-21 DIAGNOSIS — I252 Old myocardial infarction: Secondary | ICD-10-CM

## 2016-06-21 DIAGNOSIS — E785 Hyperlipidemia, unspecified: Secondary | ICD-10-CM

## 2016-06-21 DIAGNOSIS — R079 Chest pain, unspecified: Secondary | ICD-10-CM

## 2016-06-21 DIAGNOSIS — Z87891 Personal history of nicotine dependence: Secondary | ICD-10-CM

## 2016-06-21 DIAGNOSIS — R0609 Other forms of dyspnea: Secondary | ICD-10-CM

## 2016-06-21 DIAGNOSIS — I10 Essential (primary) hypertension: Secondary | ICD-10-CM | POA: Diagnosis not present

## 2016-06-21 DIAGNOSIS — I739 Peripheral vascular disease, unspecified: Secondary | ICD-10-CM | POA: Diagnosis not present

## 2016-06-21 DIAGNOSIS — D649 Anemia, unspecified: Secondary | ICD-10-CM

## 2016-06-21 DIAGNOSIS — I2510 Atherosclerotic heart disease of native coronary artery without angina pectoris: Secondary | ICD-10-CM | POA: Diagnosis not present

## 2016-06-21 DIAGNOSIS — Z9861 Coronary angioplasty status: Secondary | ICD-10-CM

## 2016-06-21 DIAGNOSIS — E669 Obesity, unspecified: Secondary | ICD-10-CM

## 2016-06-21 DIAGNOSIS — D500 Iron deficiency anemia secondary to blood loss (chronic): Secondary | ICD-10-CM

## 2016-06-21 NOTE — Progress Notes (Signed)
PCP: Antony Blackbird, MD  Clinic Note: Chief Complaint  Patient presents with  . Follow-up    Pt states no Sx. ; CAD-PCI, GI Bld.  . Near Syncope   -- more "dizziness & fatigue"  HPI: Kayla Mcclure is a 71 y.o. female with a PMH below who presents today for Delayed six-month follow-up for CAD/angina with anemia.[name] was last seen in August 2017. Follow-up of a Myoview done in June showed low risk study. Initial plan had been to perceive a cardiac catheterization, but this apparently was delayed due to anemia. We then subsequently followed up with a stress test.  Recent Hospitalizations: - anemia Sept 2017; thought to be related to small bowel AVMs. -- She was seen by Dr. Elta Guadeloupe may guide from GI. She was noted to have recurrent bleeding secondary to small bowel AVMs. Her after Plavix were temporarily held and has subsequently was restarted. - He had outpatient EGD.  Studies Reviewed: n/a  Interval History: [name] presents today overall doing fairly well. She still notes that she will get short of breath if she goes up steps of her goes long distance, but not with routine activity. This is been stable for her. Or she has noted his be more tired and fatigued and dizzy of late. No CP or near syncope, but just feels dizzy when getting up in the morning and sometimes start course the day. She's not sure if this shortness of breath of the dizziness is related to her anemia. She has only had one episode where she had some pain across her left chest about a week ago. This was a very brief sharp pain and not necessarily anginal type in nature. She does occasionally note some discomfort in her legs with walking, but this is been stable and not all the time. She has not had any PND, orthopnea or edema. No rapid irregular heartbeats or palpitations. No sig. Near-syncope, TIA or amaurosis fugax. She has not had any further episodes of blood or stool since September 2017   ROS: A  comprehensive was performed. Review of Systems  Constitutional: Positive for malaise/fatigue (Secondary to anemia). Negative for chills, fever and weight loss.  Respiratory: Positive for cough and shortness of breath.   Gastrointestinal: Negative for blood in stool, constipation and melena.       Nothing since her last hospital stay  Musculoskeletal: Positive for joint pain.  Neurological: Positive for dizziness.  Psychiatric/Behavioral: Positive for memory loss. Negative for depression. The patient does not have insomnia.   All other systems reviewed and are negative.    Past Medical History:  Diagnosis Date  . Asthma  10/31/2007  . CAD (coronary artery disease) 08/05/2007   a) NSTEMI 2009: PCI to the RCA Promus DES 2.5 mm x 23 mm (3.0 mm). b) Myoview June 2017: Nonspecific ST changes. EF greater than 65%. LOW RISK, normal study. No ischemia or infarction.  . Carotid artery disease ([place])    a. 50-69% RICA by outpt dopp 04/22/13, f/u due 04/2014.  Marland Kitchen Chronic back pain   . CKD (chronic kidney disease), stage II   . Diverticulosis   04/11/2010    Colonoscopy  . Dyslipidemia, goal LDL below 70   . Essential hypertension    a. Normal renal arteries by PV angio 05/2013.  Marland Kitchen GERD (gastroesophageal reflux disease)   . GI bleed 12/2015  . History of recent blood transfusion 12-19-15, 12-20-15 and 12-21-15  . Insomnia   . Internal hemorrhoids  04/11/2010   By colonoscopy  . Iron deficiency anemia   . Myocardial infarction 2009  . Peripheral arterial disease ([place])    a. s/p PTA and stenting of L CIA stenosis 05/2013.  Marland Kitchen Renal insufficiency   . Tobacco abuse    Quit in 05/2013    Past Surgical History:  Procedure Laterality Date  . CHOLECYSTECTOMY  1975  . COLONOSCOPY  04/11/2010  . colonscopy  11/30/2015  . CORONARY ANGIOPLASTY WITH STENT PLACEMENT  08/05/2007   Promus DES 2.5 mm x 23 mm (3 mm)  to RCA; EF 60-70%  . ENTEROSCOPY N/A 12/27/2015   Procedure: ENTEROSCOPY;  Surgeon: Clarene Essex, MD;  Location: [facility] ENDOSCOPY;  Service: Endoscopy;  Laterality: N/A;  also needs slim egd scope  . ESOPHAGOGASTRODUODENOSCOPY N/A 09/13/2013   Procedure: ESOPHAGOGASTRODUODENOSCOPY (EGD);  Surgeon: Lear Ng, MD;  Location: [facility] ENDOSCOPY;  Service: Endoscopy;  Laterality: N/A;  . ESOPHAGOGASTRODUODENOSCOPY N/A 07/14/2015   Procedure: ESOPHAGOGASTRODUODENOSCOPY (EGD);  Surgeon: Clarene Essex, MD;  Location: [facility] ENDOSCOPY;  Service: Endoscopy;  Laterality: N/A;  . gi bleed  07/2015   blood given  . HOT HEMOSTASIS N/A 12/27/2015   Procedure: HOT HEMOSTASIS (ARGON PLASMA COAGULATION/BICAP);  Surgeon: Clarene Essex, MD;  Location: [facility] ENDOSCOPY;  Service: Endoscopy;  Laterality: N/A;  . ILIAC ARTERY STENT Left 05/04/2013   L CIA-EIA -- Dr. Gwenlyn Found  . LOWER EXTREMITY ANGIOGRAM Bilateral 05/04/2013   Procedure: LOWER EXTREMITY ANGIOGRAM;  Surgeon: Lorretta Harp, MD;  Location: [facility] C[facility] LAB;  Service: Cardiovascular;  Laterality: Bilateral;  . NM MYOVIEW LTD  09/07/2015   EF > 65%. Nonspecific ST changes but no ischemic changes. No ischemia or infarction. LOW RISK  . TONSILLECTOMY  1960's  . TRANSTHORACIC ECHOCARDIOGRAM  09/21/2015   EF 60 deficits 5%. GR 1 DD. No regional wall motion analysis. No valve disease.  Marland Kitchen UPPER GI ENDOSCOPY  12/14/2015    Current Meds  Medication Sig  . albuterol (PROVENTIL HFA;VENTOLIN HFA) 108 (90 BASE) MCG/ACT inhaler Inhale 2 puffs into the lungs every 4 (four) hours as needed. For shortness of breath  . amLODipine (NORVASC) 5 MG tablet Take 5 mg by mouth daily.  Marland Kitchen aspirin EC 81 MG tablet Take 81 mg by mouth daily.  Marland Kitchen atorvastatin (LIPITOR) 40 MG tablet Take 40 mg by mouth daily.  . clopidogrel (PLAVIX) 75 MG tablet Take 75 mg by mouth daily.  . ferrous gluconate (FERGON) 324 MG tablet Take 1 tablet (324 mg total) by mouth 2 (two) times daily with a meal. (Patient taking differently: Take 324 mg by mouth 3 (three) times daily. )  . Fluticasone-Salmeterol (ADVAIR)  100-50 MCG/DOSE AEPB Inhale 1 puff into the lungs 2 (two) times daily as needed (shortness of breath).  . gabapentin (NEURONTIN) 300 MG capsule Take 1 capsule (300 mg total) by mouth 2 (two) times daily.  . hydrochlorothiazide (HYDRODIURIL) 25 MG tablet Take 25 mg by mouth daily.  . isosorbide mononitrate (IMDUR) 60 MG 24 hr tablet Take 1.5 tablets (90 mg total) by mouth daily.  Marland Kitchen loratadine (CLARITIN) 10 MG tablet Take 10 mg by mouth daily as needed for allergies.  . metoprolol succinate (TOPROL-XL) 100 MG 24 hr tablet Take 150 mg by mouth daily. Take with or immediately following a meal.  . nitroGLYCERIN (NITROSTAT) 0.4 MG SL tablet Place 1 tablet (0.4 mg total) under the tongue every 5 (five) minutes as needed for chest pain.  . pantoprazole (PROTONIX) 40 MG tablet Take 1 tablet (40 mg  total) by mouth daily.    No Known Allergies  Social History   Social History  . Marital status: Married    Spouse name: N/A  . Number of children: 1  . Years of education: N/A   Occupational History  .  Retired   Social History Main Topics  . Smoking status: Former Smoker    Types: Cigarettes    Quit date: 05/04/2013  . Smokeless tobacco: Never Used  . Alcohol use Yes     Comment: occasionally 3 times a year  . Drug use: No  . Sexual activity: No   Other Topics Concern  . None   Social History Narrative   Married. Has one child. Grandmother for a great-grandmother and 1.   No real exercise.   Occasional alcohol consumption.   Quit smoking inf Feb 2015 -- after L Iliac Stent   Right-handed   Caffeine: 1 cup every morning    family history includes Breast cancer (age of onset: 37) in her sister; Breast cancer (age of onset: 48) in her mother; Colon cancer (age of onset: 51) in her mother; Diabetes in her mother and sister; Heart attack (age of onset: 31) in her father; Heart attack (age of onset: 65) in her mother; Heart disease in her mother; Pneumonia in her father.  Wt Readings from  Last 3 Encounters:  06/21/16 66.9 kg (147 lb 6.4 oz)  06/15/16 66.9 kg (147 lb 6.4 oz)  12/27/15 63 kg (139 lb)    PHYSICAL EXAM BP (!) 95/53   Pulse 78   Ht 4\' 11"  (1.499 m)   Wt 66.9 kg (147 lb 6.4 oz)   BMI 29.77 kg/m  General appearance: alert, cooperative, appears stated age, no distress and bordrline obese HEENT: /AT, EOMI, MMM, anicteric sclera Neck: no adenopathy, R >L carotid bruit and no JVD Lungs: clear to auscultation bilaterally, normal percussion bilaterally and non-labored Heart: regular rate and rhythm, S1, S2 normal, no murmur, click, rub or gallop; nondisplaced PMI Abdomen: soft, non-tender; bowel sounds normal; no masses, no organomegaly;  Extremities: extremities normal, atraumatic, no cyanosis, and edema is minimal Pulses: 2+ and symmetric; Skin: mobility and turgor normal  Neurologic: Mental status: Alert, oriented, thought content appropriate Cranial nerves: normal (II-XII grossly intact)    Adult ECG Report  Rate: 78 ;  Rhythm: normal sinus rhythm and Nonspecific anterolateral T-wave inversions, cannot exclude ischemia. Otherwise normal axis, intervals and durations;   Narrative Interpretation: Relatively stable EKG   Other studies Reviewed: Additional studies/ records that were reviewed today include:  Recent Labs:   Lab Results  Component Value Date   HGB 10.2 (L) 12/27/2015   Lab Results  Component Value Date   CREATININE 1.06 (H) 12/27/2015   BUN 22 (H) 12/27/2015   NA 139 12/27/2015   K 5.3 (H) 12/27/2015   CL 110 12/27/2015   CO2 24 12/27/2015     ASSESSMENT / PLAN: Problem List Items Addressed This Visit    Bilateral carotid artery disease ([place])    As routine carotid Dopplers ordered. Monitored and followed by Dr. Gwenlyn Found      CAD S/P percutaneous coronary angioplasty -- Promus DES 2.5 mm x 20 mm postdilated to 3 mm (Chronic)    We had planned cardiac catheterization last year, but with GI bleeding issues this was stopped. We  then had a Myoview last year which did not show any signs of significant ischemia. As result he simply continue medical management.  Given her history of  GI bleed, I would be inclined to have her stop either aspirin or Plavix. She is concerned about stent thrombosis and therefore wants to stay on them. So we compromised that she would take Plavix one day and aspirin the other day and alternating fashion.      Chest pain with low risk for cardiac etiology    Thankfully, nonischemic Myoview. Potentially ischemic from anemia. For now with medical management using Imdur and amlodipine along with metoprolol for antianginal effect.      Relevant Orders   EKG 12-Lead (Completed)   Claudication (HCC)   Relevant Orders   VAS [place] LOWER EXTREMITY ARTERIAL DUPLEX   VAS [place] ABI WITH/WO TBI   DOE (dyspnea on exertion)   Relevant Orders   EKG 12-Lead (Completed)   Dyslipidemia, goal LDL below 70 (Chronic)    On stable dose of statin. Labs monitored by PCP. Unfortunately, I do not have recent labs. -- Need aggressive LDL lowering.      Essential hypertension, benign (Chronic)    She now actually has had issues with hypotension. Her blood pressure today is quite low. I recommended that she hold her HCTZ for couple days and then reduce it down to one half tablet daily. I also instructed her to take metoprolol in the morning and amlodipine the evening to split them. If her pressures continue to be low, I would then probably simply discontinue HCTZ as amlodipine was added for antianginal effect.      Relevant Orders   EKG 12-Lead (Completed)   Former heavy cigarette smoker (20-39 per day) (Chronic)    With CAD and PAD, she finally quit smoking a few years ago. I congratulated her efforts.      History of MI (myocardial infarction)  - NSTEMI with PCI of RCA. (Chronic)    Thankfully, this is a non-STEMI without significant scar tissue noted on Myoview or echo. She has not had a relook catheterization  since, but has definitely had a relatively recent stress test that was negative.      Iron deficiency anemia - Primary   Relevant Orders   CBC   Obesity (BMI 30-39.9) (Chronic)    The patient understands the need to lose weight with diet and exercise. We have discussed specific strategies for this.      PAD (peripheral artery disease) - bilateral CIA & CFA 50-69%; Claudication (Chronic)    This is followed by Dr. Gwenlyn Found. He has repeat Dopplers ordered along with carotid Dopplers.      Relevant Orders   VAS [place] LOWER EXTREMITY ARTERIAL DUPLEX   VAS [place] ABI WITH/WO TBI   Symptomatic anemia    Because of her recurrent GI bleeding issues, I would be fine with stopping aspirin or Plavix. She has indicated that what she would prefer to do is make aspirin and Plavix every other day. She has recurrent bleeds, I would just recommend stopping aspirin.         Current medicines are reviewed at length with the patient today. (+/- concerns) n/a The following changes have been made: see below  Patient Instructions  Medication Instructions: HOLD HCTZ (Hydrochlorothiazide) for 2 days. Then take 1/2 tablet daily.  Take Metoprolol dose in the morning and Amlodipine in the evening.   If you need a refill on your cardiac medications before your next appointment, please call your pharmacy.   Labwork: Your physician recommends that you return for lab work: CBC for iron deficiency anemia   Testing/Procedures: Schedule Carotid Duplex,  LEA/ABI, and Aorta/IVC/Iliac dopplers for April 2018  Follow-Up: Your physician recommends that you schedule a follow-up appointment in: 3-4 months with Dr. Ellyn Hack.   Any Other Special Instructions will be listed below:  Your physician has requested that you have a carotid duplex. This test is an ultrasound of the carotid arteries in your neck. It looks at blood flow through these arteries that supply the brain with blood. Allow one hour for this exam. There  are no restrictions or special instructions.  Your physician has requested that you have a lower extremity arterial duplex. During this test, ultrasound is used to evaluate arterial blood flow in the legs. Allow one hour for this exam. There are no restrictions or special instructions.  Your physician has requested that you have an ankle brachial index (ABI). During this test an ultrasound and blood pressure cuff are used to evaluate the arteries that supply the arms and legs with blood. Allow thirty minutes for this exam. There are no restrictions or special instructions.  Your physician has requested that you have a aorta and iliac duplex. During this test, an ultrasound is used to evaluate blood flow to the aorta and iliac arteries. Allow one hour for this exam. Do not eat after midnight the day before and avoid carbonated beverages.       Studies Ordered:   Orders Placed This Encounter  Procedures  . CBC  . EKG 12-Lead      [name], M.D., M.S. Interventional Cardiologist   Pager # [phone] Phone # [phone] [address] [place], [place] [address]

## 2016-06-21 NOTE — Patient Instructions (Signed)
Medication Instructions: HOLD HCTZ (Hydrochlorothiazide) for 2 days. Then take 1/2 tablet daily.  Take Metoprolol dose in the morning and Amlodipine in the evening.   If you need a refill on your cardiac medications before your next appointment, please call your pharmacy.   Labwork: Your physician recommends that you return for lab work: CBC for iron deficiency anemia   Testing/Procedures: Schedule Carotid Duplex, LEA/ABI, and Aorta/IVC/Iliac dopplers for April 2018  Follow-Up: Your physician recommends that you schedule a follow-up appointment in: 3-4 months with Dr. Ellyn Hack.   Any Other Special Instructions will be listed below:  Your physician has requested that you have a carotid duplex. This test is an ultrasound of the carotid arteries in your neck. It looks at blood flow through these arteries that supply the brain with blood. Allow one hour for this exam. There are no restrictions or special instructions.  Your physician has requested that you have a lower extremity arterial duplex. During this test, ultrasound is used to evaluate arterial blood flow in the legs. Allow one hour for this exam. There are no restrictions or special instructions.  Your physician has requested that you have an ankle brachial index (ABI). During this test an ultrasound and blood pressure cuff are used to evaluate the arteries that supply the arms and legs with blood. Allow thirty minutes for this exam. There are no restrictions or special instructions.  Your physician has requested that you have a aorta and iliac duplex. During this test, an ultrasound is used to evaluate blood flow to the aorta and iliac arteries. Allow one hour for this exam. Do not eat after midnight the day before and avoid carbonated beverages.

## 2016-06-23 ENCOUNTER — Encounter: Payer: Self-pay | Admitting: Cardiology

## 2016-06-23 NOTE — Assessment & Plan Note (Signed)
With CAD and PAD, she finally quit smoking a few years ago. I congratulated her efforts.

## 2016-06-23 NOTE — Assessment & Plan Note (Signed)
On stable dose of statin. Labs monitored by PCP. Unfortunately, I do not have recent labs. -- Need aggressive LDL lowering.

## 2016-06-23 NOTE — Assessment & Plan Note (Signed)
Thankfully, this is a non-STEMI without significant scar tissue noted on Myoview or echo. She has not had a relook catheterization since, but has definitely had a relatively recent stress test that was negative.

## 2016-06-23 NOTE — Assessment & Plan Note (Signed)
Because of her recurrent GI bleeding issues, I would be fine with stopping aspirin or Plavix. She has indicated that what she would prefer to do is make aspirin and Plavix every other day. She has recurrent bleeds, I would just recommend stopping aspirin.

## 2016-06-23 NOTE — Assessment & Plan Note (Signed)
The patient understands the need to lose weight with diet and exercise. We have discussed specific strategies for this.  

## 2016-06-23 NOTE — Assessment & Plan Note (Signed)
We had planned cardiac catheterization last year, but with GI bleeding issues this was stopped. We then had a Myoview last year which did not show any signs of significant ischemia. As result he simply continue medical management.  Given her history of GI bleed, I would be inclined to have her stop either aspirin or Plavix. She is concerned about stent thrombosis and therefore wants to stay on them. So we compromised that she would take Plavix one day and aspirin the other day and alternating fashion.

## 2016-06-23 NOTE — Assessment & Plan Note (Signed)
Thankfully, nonischemic Myoview. Potentially ischemic from anemia. For now with medical management using Imdur and amlodipine along with metoprolol for antianginal effect.

## 2016-06-23 NOTE — Assessment & Plan Note (Signed)
This is followed by Dr. Gwenlyn Found. He has repeat Dopplers ordered along with carotid Dopplers.

## 2016-06-23 NOTE — Assessment & Plan Note (Signed)
She now actually has had issues with hypotension. Her blood pressure today is quite low. I recommended that she hold her HCTZ for couple days and then reduce it down to one half tablet daily. I also instructed her to take metoprolol in the morning and amlodipine the evening to split them. If her pressures continue to be low, I would then probably simply discontinue HCTZ as amlodipine was added for antianginal effect.

## 2016-06-23 NOTE — Assessment & Plan Note (Signed)
As routine carotid Dopplers ordered. Monitored and followed by Dr. Gwenlyn Found

## 2016-06-25 LAB — CBC
HEMATOCRIT: 38.9 % (ref 35.0–45.0)
HEMOGLOBIN: 12.1 g/dL (ref 11.7–15.5)
MCH: 25.1 pg — ABNORMAL LOW (ref 27.0–33.0)
MCHC: 31.1 g/dL — ABNORMAL LOW (ref 32.0–36.0)
MCV: 80.5 fL (ref 80.0–100.0)
Platelets: 200 10*3/uL (ref 140–400)
RBC: 4.83 MIL/uL (ref 3.80–5.10)
RDW: 19.1 % — ABNORMAL HIGH (ref 11.0–15.0)
WBC: 6.8 10*3/uL (ref 3.8–10.8)

## 2016-06-27 ENCOUNTER — Encounter: Payer: Medicare Other | Admitting: Neurology

## 2016-06-27 ENCOUNTER — Ambulatory Visit: Payer: Medicare Other

## 2016-07-25 ENCOUNTER — Other Ambulatory Visit: Payer: Self-pay | Admitting: Cardiovascular Disease

## 2016-07-25 ENCOUNTER — Ambulatory Visit (HOSPITAL_COMMUNITY)
Admission: RE | Admit: 2016-07-25 | Discharge: 2016-07-25 | Disposition: A | Discharge status: Home / Self Care | Payer: Medicare Other | Source: Ambulatory Visit | Attending: Cardiovascular Disease | Admitting: Cardiovascular Disease

## 2016-07-25 DIAGNOSIS — I739 Peripheral vascular disease, unspecified: Secondary | ICD-10-CM

## 2016-07-25 DIAGNOSIS — I700 Atherosclerosis of aorta: Secondary | ICD-10-CM | POA: Insufficient documentation

## 2016-07-25 DIAGNOSIS — I779 Disorder of arteries and arterioles, unspecified: Secondary | ICD-10-CM | POA: Insufficient documentation

## 2016-07-26 ENCOUNTER — Other Ambulatory Visit: Payer: Self-pay | Admitting: Cardiovascular Disease

## 2016-07-26 DIAGNOSIS — I739 Peripheral vascular disease, unspecified: Secondary | ICD-10-CM

## 2016-08-16 ENCOUNTER — Encounter: Payer: Medicare Other | Admitting: Neurology

## 2016-09-21 ENCOUNTER — Encounter: Payer: Self-pay | Admitting: Cardiology

## 2016-09-21 ENCOUNTER — Ambulatory Visit (INDEPENDENT_AMBULATORY_CARE_PROVIDER_SITE_OTHER): Payer: Medicare Other | Admitting: Cardiology

## 2016-09-21 VITALS — BP 154/90 | HR 84 | Ht 59.0 in | Wt 148.0 lb

## 2016-09-21 DIAGNOSIS — E669 Obesity, unspecified: Secondary | ICD-10-CM | POA: Diagnosis not present

## 2016-09-21 DIAGNOSIS — I10 Essential (primary) hypertension: Secondary | ICD-10-CM

## 2016-09-21 DIAGNOSIS — I2510 Atherosclerotic heart disease of native coronary artery without angina pectoris: Secondary | ICD-10-CM | POA: Diagnosis not present

## 2016-09-21 DIAGNOSIS — I779 Disorder of arteries and arterioles, unspecified: Secondary | ICD-10-CM

## 2016-09-21 DIAGNOSIS — I739 Peripheral vascular disease, unspecified: Secondary | ICD-10-CM | POA: Diagnosis not present

## 2016-09-21 DIAGNOSIS — D649 Anemia, unspecified: Secondary | ICD-10-CM | POA: Diagnosis not present

## 2016-09-21 DIAGNOSIS — Z9861 Coronary angioplasty status: Secondary | ICD-10-CM | POA: Diagnosis not present

## 2016-09-21 DIAGNOSIS — I252 Old myocardial infarction: Secondary | ICD-10-CM

## 2016-09-21 DIAGNOSIS — R0609 Other forms of dyspnea: Secondary | ICD-10-CM | POA: Diagnosis not present

## 2016-09-21 DIAGNOSIS — R42 Dizziness and giddiness: Secondary | ICD-10-CM

## 2016-09-21 DIAGNOSIS — E785 Hyperlipidemia, unspecified: Secondary | ICD-10-CM | POA: Diagnosis not present

## 2016-09-21 NOTE — Patient Instructions (Signed)
NO CHANGE IN CURRENT TREATMENT.   Your physician wants you to follow-up in [place]. You will receive a reminder letter in the mail two months in advance. If you don't receive a letter, please call our office to schedule the follow-up appointment.

## 2016-09-21 NOTE — Progress Notes (Signed)
PCP: Antony Blackbird, MD  Clinic Note: Chief Complaint  Patient presents with  . Follow-up    no chest pain, no shortness of breath, edema in ankle,no lightheaded or dizziness,No pain or cramping in legs,   . Coronary Artery Disease  . PAD    HPI: Kayla Mcclure is a 71 y.o. female with a PMH below who presents today for 3 month follow-up for CAD-PCI with angina. Her last Myoview in June 2017 -read as low risk.  Kayla Mcclure was last seen on 06/21/2016 -> she was still noticing some exertional dyspnea going up stairs or going long distances. She is also noting some more fatigue and dizziness. Some leg discomfort with walking. We evaluated this with aortoiliac Dopplers and ABIs.  Recent Hospitalizations: None  Studies Personally Reviewed - (if available, images/films reviewed: From Epic Chart or Care Everywhere)  AO-Iliac Dopplers - stable; 12 month f/u   ABIs - normal  CBC noted below  Interval History: Kayla Mcclure returns today overall doing fairly well. She indicates her PCP recently increased her atorvastatin 80 mg, because she is not at goal. Otherwise besides some mild end of day ankle edema, she denies any significant cardiac symptoms. No chest tightness pressure with rest or exertion. No PND or orthopnea. Usually the swelling goes down when she puts her feet up, and is not there during the day.   No palpitations, lightheadedness, dizziness, weakness or syncope/near syncope. -except  rare dizziness (fleeting  - non-descript; 1 episode of near syncope ~1 month ago - no syncope).  No TIA/amaurosis fugax symptoms. No melena, hematochezia, hematuria, or epstaxis. No claudication - her leg fatigue seems to be getting better..  ROS: A comprehensive was performed. Review of Systems  Constitutional: Negative for fever and malaise/fatigue (Improved).  HENT: Negative for congestion.   Respiratory: Negative for shortness of breath.   Gastrointestinal: Negative for heartburn,  nausea and vomiting.  Genitourinary: Negative for dysuria.  Musculoskeletal: Positive for joint pain.  Neurological: Positive for dizziness (See history of present illness).  Endo/Heme/Allergies: Negative for environmental allergies.  Psychiatric/Behavioral: Positive for memory loss. Negative for depression. The patient is not nervous/anxious and does not have insomnia.   All other systems reviewed and are negative.   I have reviewed and (if needed) personally updated the patient's problem list, medications, allergies, past medical and surgical history, social and family history.   Past Medical History:  Diagnosis Date  . Asthma  10/31/2007  . CAD (coronary artery disease) 08/05/2007   a) NSTEMI 2009: PCI to the RCA Promus DES 2.5 mm x 23 mm (3.0 mm). b) Myoview June 2017: Nonspecific ST changes. EF greater than 65%. LOW RISK, normal study. No ischemia or infarction.  . Carotid artery disease ([place])    a. 50-69% RICA by outpt dopp 04/22/13, f/u due 04/2014.  Marland Kitchen Chronic back pain   . CKD (chronic kidney disease), stage II   . Diverticulosis   04/11/2010    Colonoscopy  . Dyslipidemia, goal LDL below 70   . Essential hypertension    a. Normal renal arteries by PV angio 05/2013.  Marland Kitchen GERD (gastroesophageal reflux disease)   . GI bleed 12/2015  . History of recent blood transfusion 12-19-15, 12-20-15 and 12-21-15  . Insomnia   . Internal hemorrhoids  04/11/2010   By colonoscopy  . Iron deficiency anemia   . Myocardial infarction ([place]) 2009  . Peripheral arterial disease ([place])    a. s/p PTA and stenting of L CIA stenosis 05/2013.  Marland Kitchen  Renal insufficiency   . Tobacco abuse    Quit in 05/2013    Past Surgical History:  Procedure Laterality Date  . CHOLECYSTECTOMY  1975  . COLONOSCOPY  04/11/2010  . colonscopy  11/30/2015  . CORONARY ANGIOPLASTY WITH STENT PLACEMENT  08/05/2007   Promus DES 2.5 mm x 23 mm (3 mm)  to RCA; EF 60-70%  . ENTEROSCOPY N/A 12/27/2015   Procedure: ENTEROSCOPY;   Surgeon: Clarene Essex, MD;  Location: [facility] ENDOSCOPY;  Service: Endoscopy;  Laterality: N/A;  also needs slim egd scope  . ESOPHAGOGASTRODUODENOSCOPY N/A 09/13/2013   Procedure: ESOPHAGOGASTRODUODENOSCOPY (EGD);  Surgeon: Lear Ng, MD;  Location[facility] ENDOSCOPY;  Service: Endoscopy;  Laterality: N/A;  . ESOPHAGOGASTRODUODENOSCOPY N/A 07/14/2015   Procedure: ESOPHAGOGASTRODUODENOSCOPY (EGD);  Surgeon: Clarene Essex, MD;  Location: [facility] ENDOSCOPY;  Service: Endoscopy;  Laterality: N/A;  . gi bleed  07/2015   blood given  . HOT HEMOSTASIS N/A 12/27/2015   Procedure: HOT HEMOSTASIS (ARGON PLASMA COAGULATION/BICAP);  Surgeon: Clarene Essex, MD;  Location: [facility] ENDOSCOPY;  Service: Endoscopy;  Laterality: N/A;  . ILIAC ARTERY STENT Left 05/04/2013   L CIA-EIA -- Dr. Gwenlyn Found  . LOWER EXTREMITY ANGIOGRAM Bilateral 05/04/2013   Procedure: LOWER EXTREMITY ANGIOGRAM;  Surgeon: Lorretta Harp, MD;  Location[facility] CATH LAB;  Service: Cardiovascular;  Laterality: Bilateral;  . NM MYOVIEW LTD  09/07/2015   EF > 65%. Nonspecific ST changes but no ischemic changes. No ischemia or infarction. LOW RISK  . TONSILLECTOMY  1960's  . TRANSTHORACIC ECHOCARDIOGRAM  09/21/2015   EF 60-65%. GR 1 DD. No regional wall motion analysis. No valve disease.  Marland Kitchen UPPER GI ENDOSCOPY  12/14/2015    Current Meds  Medication Sig  . albuterol (PROVENTIL HFA;VENTOLIN HFA) 108 (90 BASE) MCG/ACT inhaler Inhale 2 puffs into the lungs every 4 (four) hours as needed. For shortness of breath  . amLODipine (NORVASC) 5 MG tablet Take 5 mg by mouth daily.  Marland Kitchen aspirin EC 81 MG tablet Take 81 mg by mouth daily.  Marland Kitchen atorvastatin (LIPITOR) 80 MG tablet Take 80 mg by mouth daily.   . clopidogrel (PLAVIX) 75 MG tablet Take 75 mg by mouth daily.  . ferrous gluconate (FERGON) 324 MG tablet Take 1 tablet (324 mg total) by mouth 2 (two) times daily with a meal. (Patient taking differently: Take 324 mg by mouth 3 (three) times daily. )  . Fluticasone-Salmeterol  (ADVAIR) 100-50 MCG/DOSE AEPB Inhale 1 puff into the lungs 2 (two) times daily as needed (shortness of breath).  . gabapentin (NEURONTIN) 300 MG capsule Take 1 capsule (300 mg total) by mouth 2 (two) times daily.  . hydrochlorothiazide (HYDRODIURIL) 25 MG tablet Take 25 mg by mouth daily.  . isosorbide mononitrate (IMDUR) 60 MG 24 hr tablet Take 1.5 tablets (90 mg total) by mouth daily.  Marland Kitchen loratadine (CLARITIN) 10 MG tablet Take 10 mg by mouth daily as needed for allergies.  . metoprolol succinate (TOPROL-XL) 100 MG 24 hr tablet Take 150 mg by mouth daily. Take with or immediately following a meal.    No Known Allergies  Social History   Social History  . Marital status: Married    Spouse name: N/A  . Number of children: 1  . Years of education: N/A   Occupational History  .  Retired   Social History Main Topics  . Smoking status: Former Smoker    Types: Cigarettes    Quit date: 05/04/2013  . Smokeless tobacco: Never Used  . Alcohol  use Yes     Comment: occasionally 3 times a year  . Drug use: No  . Sexual activity: No   Other Topics Concern  . None   Social History Narrative   Married. Has one child. Grandmother for a great-grandmother and 1.   No real exercise.   Occasional alcohol consumption.   Quit smoking inf Feb 2015 -- after L Iliac Stent   Right-handed   Caffeine: 1 cup every morning    family history includes Breast cancer (age of onset: 81) in her sister; Breast cancer (age of onset: 73) in her mother; Colon cancer (age of onset: 69) in her mother; Diabetes in her mother and sister; Heart attack (age of onset: 30) in her father; Heart attack (age of onset: 51) in her mother; Heart disease in her mother; Pneumonia in her father.  Wt Readings from Last 3 Encounters:  09/21/16 148 lb (67.1 kg)  06/21/16 147 lb 6.4 oz (66.9 kg)  06/15/16 147 lb 6.4 oz (66.9 kg)    PHYSICAL EXAM BP (!) 154/90   Pulse 84   Ht 4\' 11"  (1.499 m)   Wt 148 lb (67.1 kg)   BMI  29.89 kg/m  General appearance: alert, cooperative, appears stated age, no distress. borderline obese HEENT: [place]/AT, EOMI, MMM, anicteric sclera Neck: no adenopathy, no carotid bruit and no JVD Lungs: clear to auscultation bilaterally, normal percussion bilaterally and non-labored Heart: regular rate and rhythm, S1 &S2 normal, no murmur, click, rub or gallop; non-displced PMI Abdomen: soft, non-tender; bowel sounds normal; no masses,  no organomegaly; no HJr Extremities: extremities normal, atraumatic, no cyanosis, and NO edema Pulses: 2+ and symmetric;  Skin: mobility and turgor normal, no evidence of bleeding or bruising and no lesions noted  Neurologic: Mental status: Alert & oriented x 3, thought content appropriate; non-focal exam.  Pleasant mood & affect.    Adult ECG Report n/a  Other studies Reviewed: Additional studies/ records that were reviewed today include:  Recent Labs:  PCP follows Lipids --> recently increased Atorvastatin)  Lab Results  Component Value Date   WBC 6.8 06/25/2016   HGB 12.1 06/25/2016   HCT 38.9 06/25/2016   MCV 80.5 06/25/2016   PLT 200 06/25/2016    ASSESSMENT / PLAN: Problem List Items Addressed This Visit    Bilateral carotid artery disease ([place])    The plan last April was for annual follow-up carotid Dopplers, however these were not done. I think they can probably be done every couple years if they're stable. I don't hear a bruit      CAD S/P percutaneous coronary angioplasty -- Promus DES 2.5 mm x 20 mm postdilated to 3 mm - Primary (Chronic)    She continues to do relatively well. No further symptoms to be concerned about since her anemia resolved. Last Myoview was negative for ischemia. In the absence of further anginal symptoms, I would not consider invasive evaluation at this time. Plan: Continue current dose of Imdur and amlodipine for possible spasm an antianginal effect. She is also on high-dose Toprol. She remains on aspirin and  Plavix, although I think she probably would be okay stopping aspirin.      DOE (dyspnea on exertion)    Stable now that her anemia has resolved. Negative ischemic evaluation last year.      Dyslipidemia, goal LDL below 70 (Chronic)   Essential hypertension, benign (Chronic)    My last saw her she was hypotensive so we backed off on. Now she  has higher blood pressures. She states that she just took her blood pressure medicines and usually her blood pressure is better controlled. For now continue current dose of amlodipine, HCTZ and Toprol.  Potentially increase amlodipine if blood pressures continue to be high, for now I would simply monitor      History of MI (myocardial infarction)  - NSTEMI with PCI of RCA. (Chronic)    No significant scar seen on Myoview. Negative for ischemia. No relook catheterization since her MI      Obesity (BMI 30-39.9) (Chronic)    She actually is now below the "obesity threshold ". Her weight has not changed too much, but she is trying to be more active and adjust her diet.      Orthostatic dizziness (Chronic)    Because she has had this issue with orthostatic dizziness, I'm reluctant to be too aggressive with hypertension management.      PAD (peripheral artery disease) - bilateral CIA & CFA 50-69%; Claudication (Chronic)    Stable aortoiliac Dopplers and relatively normal ABIs. Followed by Dr. Gwenlyn Found. Does not say that she is having symptoms consistent with active claudication.  Continue cardiovascular risk modification as noted elsewhere      Symptomatic anemia    Stable hemoglobin. She is taking her aspirin and Plavix all day. I would prefer simply to stopping aspirin         Current medicines are reviewed at length with the patient today. (+/- concerns) n/a The following changes have been made: n/a  Patient Instructions  NO CHANGE IN CURRENT TREATMENT.   Your physician wants you to follow-up in [place]. You will  receive a reminder letter in the mail two months in advance. If you don't receive a letter, please call our office to schedule the follow-up appointment.    Studies Ordered:   No orders of the defined types were placed in this encounter.     [name], M.D., M.S. Interventional Cardiologist   Pager # [phone] Phone # [phone] [address] [place], [place] [address]

## 2016-09-23 ENCOUNTER — Encounter: Payer: Self-pay | Admitting: Cardiology

## 2016-09-23 NOTE — Assessment & Plan Note (Signed)
Because she has had this issue with orthostatic dizziness, I'm reluctant to be too aggressive with hypertension management.

## 2016-09-23 NOTE — Assessment & Plan Note (Signed)
My last saw her she was hypotensive so we backed off on. Now she has higher blood pressures. She states that she just took her blood pressure medicines and usually her blood pressure is better controlled. For now continue current dose of amlodipine, HCTZ and Toprol.  Potentially increase amlodipine if blood pressures continue to be high, for now I would simply monitor

## 2016-09-23 NOTE — Assessment & Plan Note (Addendum)
Stable aortoiliac Dopplers and relatively normal ABIs. Followed by Dr. Gwenlyn Found. Does not say that she is having symptoms consistent with active claudication.  Continue cardiovascular risk modification as noted elsewhere

## 2016-09-23 NOTE — Assessment & Plan Note (Signed)
She actually is now below the "obesity threshold ". Her weight has not changed too much, but she is trying to be more active and adjust her diet.

## 2016-09-23 NOTE — Assessment & Plan Note (Signed)
The plan last April was for annual follow-up carotid Dopplers, however these were not done. I think they can probably be done every couple years if they're stable. I don't hear a bruit

## 2016-09-23 NOTE — Assessment & Plan Note (Signed)
No significant scar seen on Myoview. Negative for ischemia. No relook catheterization since her MI

## 2016-09-23 NOTE — Assessment & Plan Note (Signed)
She continues to do relatively well. No further symptoms to be concerned about since her anemia resolved. Last Myoview was negative for ischemia. In the absence of further anginal symptoms, I would not consider invasive evaluation at this time. Plan: Continue current dose of Imdur and amlodipine for possible spasm an antianginal effect. She is also on high-dose Toprol. She remains on aspirin and Plavix, although I think she probably would be okay stopping aspirin.

## 2016-09-23 NOTE — Assessment & Plan Note (Signed)
Stable now that her anemia has resolved. Negative ischemic evaluation last year.

## 2016-09-23 NOTE — Assessment & Plan Note (Signed)
Stable hemoglobin. She is taking her aspirin and Plavix all day. I would prefer simply to stopping aspirin

## 2016-10-16 ENCOUNTER — Other Ambulatory Visit: Payer: Self-pay | Admitting: Family Medicine

## 2016-10-16 DIAGNOSIS — Z1231 Encounter for screening mammogram for malignant neoplasm of breast: Secondary | ICD-10-CM

## 2016-11-02 ENCOUNTER — Ambulatory Visit
Admission: RE | Admit: 2016-11-02 | Discharge: 2016-11-02 | Disposition: A | Discharge status: Home / Self Care | Payer: Medicare Other | Source: Ambulatory Visit | Attending: Family Medicine | Admitting: Family Medicine

## 2016-11-02 DIAGNOSIS — Z1231 Encounter for screening mammogram for malignant neoplasm of breast: Secondary | ICD-10-CM

## 2017-03-21 ENCOUNTER — Ambulatory Visit: Payer: Medicare Other | Admitting: Cardiology

## 2017-05-01 ENCOUNTER — Ambulatory Visit: Payer: Medicare Other | Admitting: Cardiology

## 2017-05-14 ENCOUNTER — Encounter: Payer: Self-pay | Admitting: Cardiology

## 2017-05-14 ENCOUNTER — Ambulatory Visit: Payer: Medicare Other | Admitting: Cardiology

## 2017-05-14 VITALS — BP 94/60 | HR 90 | Ht 59.0 in | Wt 146.0 lb

## 2017-05-14 DIAGNOSIS — R42 Dizziness and giddiness: Secondary | ICD-10-CM

## 2017-05-14 DIAGNOSIS — I2510 Atherosclerotic heart disease of native coronary artery without angina pectoris: Secondary | ICD-10-CM

## 2017-05-14 DIAGNOSIS — I779 Disorder of arteries and arterioles, unspecified: Secondary | ICD-10-CM | POA: Diagnosis not present

## 2017-05-14 DIAGNOSIS — Z9861 Coronary angioplasty status: Secondary | ICD-10-CM

## 2017-05-14 DIAGNOSIS — E669 Obesity, unspecified: Secondary | ICD-10-CM | POA: Diagnosis not present

## 2017-05-14 DIAGNOSIS — I739 Peripheral vascular disease, unspecified: Secondary | ICD-10-CM | POA: Diagnosis not present

## 2017-05-14 DIAGNOSIS — I10 Essential (primary) hypertension: Secondary | ICD-10-CM

## 2017-05-14 DIAGNOSIS — I252 Old myocardial infarction: Secondary | ICD-10-CM | POA: Diagnosis not present

## 2017-05-14 DIAGNOSIS — E785 Hyperlipidemia, unspecified: Secondary | ICD-10-CM

## 2017-05-14 MED ORDER — METOPROLOL SUCCINATE ER 100 MG PO TB24: 150.0000 mg | ORAL_TABLET | Freq: Every day | ORAL | 11 refills | Status: DC

## 2017-05-14 NOTE — Progress Notes (Signed)
PCP: Lois Huxley, PA  Clinic Note: Chief Complaint  Patient presents with  . Follow-up    6 month  . Coronary Artery Disease  . PAD    HPI: Kayla Mcclure is a 72 y.o. female with a PMH below who presents today for 8 month follow-up for CAD-PCI identified presenting with non-STEMI in 2009.Marland Kitchen Her last Myoview in June 2017 -read as low risk.  06/21/2016 -> she was still noticing some exertional dyspnea going up stairs or going long distances. She is also noting some more fatigue and dizziness. Some leg discomfort with walking. We evaluated this with aortoiliac Dopplers and ABIs   Kayla Mcclure was last seen on June 22,2018 -No chest tightness pressure with rest or exertion. No PND or orthopnea. Usually the swelling goes down when she puts her feet up, and is not there during the day.  No palpitations, lightheadedness, dizziness, weakness or syncope/near syncope. -except  rare dizziness (fleeting  - non-descript; 1 episode of near syncope ~1 month ago - no syncope).  Recent Hospitalizations: None  Studies Personally Reviewed - (if available, images/films reviewed: From Epic Chart or Care Everywhere)  Lower extremity ABIs and aortoiliac (April 2018): Normal ABIs.  Status post left common and external iliac stents.  Interval History: Kayla Mcclure returns today overall doing fairly well.  From a cardiac standpoint really the only thing if she does along amount of walking she will have some discomfort in her right thigh, but it seems to be more related to her hip and knee and she does have a somewhat antalgic gait when she is walking after a while.  Otherwise she really has not had any cardiac symptoms to speak of.  She seems to be tolerating the increased dose of atorvastatin without significant myalgias.  She really denies any chest tightness or pressure with rest or exertion.  No resting or exertional dyspnea.  No PND, orthopnea with only mild end of day edema.  No rapid irregular  heartbeats palpitations, TIA/amaurosis fugax or syncope/near syncope.  Still has mild stable claudication, but not limiting.  She had the flu not that long ago, but is recovering.  She had a headache yesterday.  ROS: A comprehensive was performed. Review of Systems  Constitutional: Negative for fever and malaise/fatigue (Improved).  HENT: Negative for congestion and nosebleeds.   Respiratory: Negative for shortness of breath.        Not coughing or wheezing anymore from the flu.  Gastrointestinal: Negative for heartburn, melena, nausea and vomiting.  Genitourinary: Negative for dysuria and hematuria.  Musculoskeletal: Positive for joint pain. Negative for myalgias (No longer having the flu related myalgias).  Neurological: Positive for dizziness (Sometimes positional most noted when she had flu type symptoms. - ).  Endo/Heme/Allergies: Negative for environmental allergies.  Psychiatric/Behavioral: Positive for memory loss. Negative for depression. The patient is not nervous/anxious and does not have insomnia.   All other systems reviewed and are negative.   I have reviewed and (if needed) personally updated the patient's problem list, medications, allergies, past medical and surgical history, social and family history.   Past Medical History:  Diagnosis Date  . Asthma  10/31/2007  . CAD (coronary artery disease) 08/05/2007   a) NSTEMI 2009: PCI to the RCA Promus DES 2.5 mm x 23 mm (3.0 mm). b) Myoview June 2017: Nonspecific ST changes. EF greater than 65%. LOW RISK, normal study. No ischemia or infarction.  . Carotid artery disease (HCC)    a. 50-69% RICA  by outpt dopp 04/22/13, f/u due 04/2014.  Marland Kitchen Chronic back pain   . CKD (chronic kidney disease), stage II   . Diverticulosis   04/11/2010    Colonoscopy  . Dyslipidemia, goal LDL below 70   . Essential hypertension    a. Normal renal arteries by PV angio 05/2013.  Marland Kitchen GERD (gastroesophageal reflux disease)   . GI bleed 12/2015  .  History of recent blood transfusion 12-19-15, 12-20-15 and 12-21-15  . Insomnia   . Internal hemorrhoids  04/11/2010   By colonoscopy  . Iron deficiency anemia   . Myocardial infarction ([place]) 2009  . Peripheral arterial disease ([place])    a. s/p PTA and stenting of L CIA stenosis 05/2013.  Marland Kitchen Renal insufficiency   . Tobacco abuse    Quit in 05/2013    Past Surgical History:  Procedure Laterality Date  . CHOLECYSTECTOMY  1975  . COLONOSCOPY  04/11/2010  . colonscopy  11/30/2015  . CORONARY ANGIOPLASTY WITH STENT PLACEMENT  08/05/2007   Promus DES 2.5 mm x 23 mm (3 mm)  to RCA; EF 60-70%  . ENTEROSCOPY N/A 12/27/2015   Procedure: ENTEROSCOPY;  Surgeon: Clarene Essex, MD;  Location: [facility] ENDOSCOPY;  Service: Endoscopy;  Laterality: N/A;  also needs slim egd scope  . ESOPHAGOGASTRODUODENOSCOPY N/A 09/13/2013   Procedure: ESOPHAGOGASTRODUODENOSCOPY (EGD);  Surgeon: Lear Ng, MD;  Location: [facility] ENDOSCOPY;  Service: Endoscopy;  Laterality: N/A;  . ESOPHAGOGASTRODUODENOSCOPY N/A 07/14/2015   Procedure: ESOPHAGOGASTRODUODENOSCOPY (EGD);  Surgeon: Clarene Essex, MD;  Location: [facility] ENDOSCOPY;  Service: Endoscopy;  Laterality: N/A;  . gi bleed  07/2015   blood given  . HOT HEMOSTASIS N/A 12/27/2015   Procedure: HOT HEMOSTASIS (ARGON PLASMA COAGULATION/BICAP);  Surgeon: Clarene Essex, MD;  Location: [facility] ENDOSCOPY;  Service: Endoscopy;  Laterality: N/A;  . ILIAC ARTERY STENT Left 05/04/2013   L CIA-EIA -- Dr. Gwenlyn Found  . LOWER EXTREMITY ANGIOGRAM Bilateral 05/04/2013   Procedure: LOWER EXTREMITY ANGIOGRAM;  Surgeon: Lorretta Harp, MD;  Location: [facility] CATH LAB;  Service: Cardiovascular;  Laterality: Bilateral;  . NM MYOVIEW LTD  09/07/2015   EF > 65%. Nonspecific ST changes but no ischemic changes. No ischemia or infarction. LOW RISK  . TONSILLECTOMY  1960's  . TRANSTHORACIC ECHOCARDIOGRAM  09/21/2015   EF 60-65%. GR 1 DD. No regional wall motion analysis. No valve disease.  Marland Kitchen UPPER GI ENDOSCOPY  12/14/2015     Current Meds  Medication Sig  . amLODipine (NORVASC) 5 MG tablet Take 5 mg by mouth daily.  Marland Kitchen aspirin EC 81 MG tablet Take 81 mg by mouth daily.  Marland Kitchen atorvastatin (LIPITOR) 80 MG tablet Take 80 mg by mouth daily.   . ferrous gluconate (FERGON) 324 MG tablet Take 1 tablet (324 mg total) by mouth 2 (two) times daily with a meal. (Patient taking differently: Take 324 mg by mouth 3 (three) times daily. )  . gabapentin (NEURONTIN) 300 MG capsule Take 1 capsule (300 mg total) by mouth 2 (two) times daily.  . hydrochlorothiazide (HYDRODIURIL) 25 MG tablet Take 25 mg by mouth daily.  . isosorbide mononitrate (IMDUR) 60 MG 24 hr tablet Take 1.5 tablets (90 mg total) by mouth daily.  Marland Kitchen loratadine (CLARITIN) 10 MG tablet Take 10 mg by mouth daily as needed for allergies.  . metoprolol succinate (TOPROL-XL) 100 MG 24 hr tablet Take 2 tablets (200 mg total) by mouth at bedtime. Take with or immediately following a meal.  . nitroGLYCERIN (NITROSTAT) 0.4 MG SL tablet Place  1 tablet (0.4 mg total) under the tongue every 5 (five) minutes as needed for chest pain.  . pantoprazole (PROTONIX) 40 MG tablet Take 1 tablet (40 mg total) by mouth daily.  . [DISCONTINUED] metoprolol succinate (TOPROL-XL) 100 MG 24 hr tablet Take 150 mg by mouth daily. Take with or immediately following a meal.    No Known Allergies  Social History   Socioeconomic History  . Marital status: Married    Spouse name: None  . Number of children: 1  . Years of education: None  . Highest education level: None  Social Needs  . Financial resource strain: None  . Food insecurity - worry: None  . Food insecurity - inability: None  . Transportation needs - medical: None  . Transportation needs - non-medical: None  Occupational History    Employer: RETIRED  Tobacco Use  . Smoking status: Former Smoker    Types: Cigarettes    Last attempt to quit: 05/04/2013    Years since quitting: 4.0  . Smokeless tobacco: Never Used  Substance  and Sexual Activity  . Alcohol use: Yes    Comment: occasionally 3 times a year  . Drug use: No  . Sexual activity: No    Partners: Male    Birth control/protection: None  Other Topics Concern  . None  Social History Narrative   Married. Has one child. Grandmother for a great-grandmother and 1.   No real exercise.   Occasional alcohol consumption.   Quit smoking inf Feb 2015 -- after L Iliac Stent   Right-handed   Caffeine: 1 cup every morning    family history includes Breast cancer in her sister; Breast cancer (age of onset: 21) in her sister; Breast cancer (age of onset: 74) in her mother; Colon cancer (age of onset: 80) in her mother; Diabetes in her mother and sister; Heart attack (age of onset: 50) in her father; Heart attack (age of onset: 49) in her mother; Heart disease in her mother; Pneumonia in her father.  Wt Readings from Last 3 Encounters:  05/14/17 146 lb (66.2 kg)  09/21/16 148 lb (67.1 kg)  06/21/16 147 lb 6.4 oz (66.9 kg)    PHYSICAL EXAM BP 94/60   Pulse 90   Ht 4\' 11"  (1.499 m)   Wt 146 lb (66.2 kg)   BMI 29.49 kg/m   Physical Exam  Constitutional: She is oriented to person, place, and time. She appears well-developed and well-nourished. No distress.  Borderline obese.  Otherwise healthy appearing.  Well-groomed  HENT:  Head: Normocephalic and atraumatic.  Eyes: EOM are normal.  Neck: No hepatojugular reflux and no JVD present. Carotid bruit is not present.  Cardiovascular: Normal rate, regular rhythm, normal heart sounds and normal pulses.  No extrasystoles are present. PMI is not displaced. Exam reveals no gallop.  No murmur heard. Pulmonary/Chest: Effort normal and breath sounds normal. No respiratory distress. She has no wheezes. She has no rales.  Abdominal: Soft. Bowel sounds are normal. She exhibits no distension. There is no tenderness.  Musculoskeletal: Normal range of motion. She exhibits no edema.  Neurological: She is alert and oriented to  person, place, and time.  Psychiatric: She has a normal mood and affect. Her behavior is normal. Judgment and thought content normal.  Nursing note and vitals reviewed. Distant history of PCI  Adult ECG Report n/a  Other studies Reviewed: Additional studies/ records that were reviewed today include:  Recent Labs:  PCP follows Lipids --> recently increased Atorvastatin)  ASSESSMENT / PLAN: Problem List Items Addressed This Visit    Bilateral carotid artery disease (HCC)   Relevant Medications   metoprolol succinate (TOPROL-XL) 100 MG 24 hr tablet   Other Relevant Orders   VAS US CAROTID   CAD S/P percutaneous coronary angioplasty -- Promus DES 2.5 mm x 20 mm postdilated to 3 mm - Primary (Chronic)    Distant history of PCI to the RCA.  Negative Myoview in 2017. No recurrent anginal symptoms with rest or exertion.  No need for further investigation. Continue aspirin, statin as well as beta-blocker and amlodipine.  Because of her dizziness, we will change metoprolol to nightly. She is also on Imdur.  Based on my previous note, the intention was for her to  Stop aspirin and continue Plavix, however the opposite occurred.      Relevant Medications   metoprolol succinate (TOPROL-XL) 100 MG 24 hr tablet   Dyslipidemia, goal LDL below 70 (Chronic)    Remains on stable dose of atorvastatin.  Due to have labs checked by PCP.  Target LDL is definitely less than 70.  If we need to do additional management.  I would ask the PCP sent with a copy of the lab values so that we can arrange lipid clinic follow-up ([facility] -Cardiovascular Risk Reduction Clinic -here at our [facility])      Relevant Medications   metoprolol succinate (TOPROL-XL) 100 MG 24 hr tablet   Other Relevant Orders   VAS US AORTA/IVC/ILIACS   VAS US CAROTID   VAS [place] ABI WITH/WO TBI   Essential hypertension, benign (Chronic)    Actually borderline hypotensive again today.  We have backed up on quite a bit of medication  and she is now only on 5 mg of amlodipine and 100 mg Toprol with HCTZ.  I have recommended that she check her blood pressures in the morning if she feels dizzy, and if she is dizzy hold either HCTZ or amlodipine for the day. We have also changed the Toprol dose to nightly.      Relevant Medications   metoprolol succinate (TOPROL-XL) 100 MG 24 hr tablet   History of MI (myocardial infarction)  - NSTEMI with PCI of RCA. (Chronic)    No notable scar on Myoview.  No evidence of ischemia.  No symptomatic evidence of ischemia either on stable regimen.   No requirement for further ischemic evaluation in the absence of symptoms.      Obesity (BMI 30-39.9) (Chronic)    Remains below the obesity threshold.  Trying to stay active and adjusting her diet.  Hopefully her lipids will be better on follow-up evaluation.      Orthostatic dizziness (Chronic)    Aggressive hydration.  Adjust timing of blood pressure medications with Toprol at night and amlodipine in the morning.  Also if she is somewhat symptomatic with dizziness, hold either one or both of amlodipine and HCTZ in the morning.      PAD (peripheral artery disease) - bilateral CIA & CFA 50-69%; Claudication (Chronic)    Stable claudication.  Due to have routine follow-up evaluation of iliacs as well as blood from the ABIs.  Also has a history of some carotid artery stenosis and so we will therefore check carotid Dopplers as well.  Otherwise continue CAD related risk factor reducing medications.      Relevant Medications   metoprolol succinate (TOPROL-XL) 100 MG 24 hr tablet   Other Relevant Orders   VAS US AORTA/IVC/ILIACS   VAS  US CAROTID   VAS [place] ABI WITH/WO TBI      Current medicines are reviewed at length with the patient today. (+/- concerns) n/a The following changes have been made: n/a  Patient Instructions  MEDICATION INSTRUCTIONS   CHANGE TAKING METOPROLOL ( TOPROL 0 AT BEDTIME.  IF YOUR ARE DIZZY IN THE MORNING -  CHECK BLOOD PRESSURE IF TOP NUMBER IS GREATER THAN 100  DO NOT TAKE AMLODIPINE THAT DAY.    HAVE  PRIMARY SEND A COPY OF LABS - CHOLESTEROL TO OFFICE   Your physician recommends that you schedule a follow-up appointment in CVRR - 2 MONTHS - CHOLESTEROL .      SCHEDULE AT [place] Your physician has requested that you have a carotid duplex. This test is an ultrasound of the carotid arteries in your neck. It looks at blood flow through these arteries that supply the brain with blood. Allow one hour for this exam. There are no restrictions or special instructions. AND Your physician has requested that you have a /AOTR /IVC/ILLIAC lower extremity . This test is an ultrasound of the arteries in the legs. It looks at arterial blood flow in the legs. Allow one hour for Lower Arterial scans. There are no restrictions or special instructions AND  Your physician has requested that you have an ankle brachial index (ABI). During this test an ultrasound and blood pressure cuff are used to evaluate the arteries that supply the arms and legs with blood. Allow thirty minutes for this exam. There are no restrictions or special instructions.        Your physician wants you to follow-up in [place].You will receive a reminder letter in the mail two months in advance. If you don't receive a letter, please call our office to schedule the follow-up appointment.    If you need a refill on your cardiac medications before your next appointment, please call your pharmacy.     Studies Ordered:   No orders of the defined types were placed in this encounter.     [name], M.D., M.S. Interventional Cardiologist   Pager # [phone] Phone # [phone] [address] [place], [place] [address]

## 2017-05-14 NOTE — Patient Instructions (Addendum)
MEDICATION INSTRUCTIONS   CHANGE TAKING METOPROLOL ( TOPROL 0 AT BEDTIME.  IF YOUR ARE DIZZY IN THE MORNING - CHECK BLOOD PRESSURE IF TOP NUMBER IS GREATER THAN 100  DO NOT TAKE AMLODIPINE THAT DAY.    HAVE  PRIMARY SEND A COPY OF LABS - CHOLESTEROL TO OFFICE   Your physician recommends that you schedule a follow-up appointment in CVRR - 2 MONTHS - CHOLESTEROL .      SCHEDULE AT [place] Your physician has requested that you have a carotid duplex. This test is an ultrasound of the carotid arteries in your neck. It looks at blood flow through these arteries that supply the brain with blood. Allow one hour for this exam. There are no restrictions or special instructions. AND Your physician has requested that you have a /AOTR /IVC/ILLIAC lower extremity . This test is an ultrasound of the arteries in the legs. It looks at arterial blood flow in the legs. Allow one hour for Lower Arterial scans. There are no restrictions or special instructions AND  Your physician has requested that you have an ankle brachial index (ABI). During this test an ultrasound and blood pressure cuff are used to evaluate the arteries that supply the arms and legs with blood. Allow thirty minutes for this exam. There are no restrictions or special instructions.        Your physician wants you to follow-up in [place].You will receive a reminder letter in the mail two months in advance. If you don't receive a letter, please call our office to schedule the follow-up appointment.    If you need a refill on your cardiac medications before your next appointment, please call your pharmacy.

## 2017-05-16 ENCOUNTER — Encounter (HOSPITAL_COMMUNITY): Payer: Medicare Other

## 2017-05-16 NOTE — Assessment & Plan Note (Signed)
Distant history of PCI to the RCA.  Negative Myoview in 2017. No recurrent anginal symptoms with rest or exertion.  No need for further investigation. Continue aspirin, statin as well as beta-blocker and amlodipine.  Because of her dizziness, we will change metoprolol to nightly. She is also on Imdur.  Based on my previous note, the intention was for her to  Stop aspirin and continue Plavix, however the opposite occurred.

## 2017-05-16 NOTE — Assessment & Plan Note (Signed)
Aggressive hydration.  Adjust timing of blood pressure medications with Toprol at night and amlodipine in the morning.  Also if she is somewhat symptomatic with dizziness, hold either one or both of amlodipine and HCTZ in the morning.

## 2017-05-16 NOTE — Assessment & Plan Note (Addendum)
Remains on stable dose of atorvastatin.  Due to have labs checked by PCP.  Target LDL is definitely less than 70.  If we need to do additional management.  I would ask the PCP sent with a copy of the lab values so that we can arrange lipid clinic follow-up ([facility] -Cardiovascular Risk Reduction Clinic -here at our [facility])

## 2017-05-16 NOTE — Assessment & Plan Note (Signed)
Remains below the obesity threshold.  Trying to stay active and adjusting her diet.  Hopefully her lipids will be better on follow-up evaluation.

## 2017-05-16 NOTE — Assessment & Plan Note (Addendum)
Actually borderline hypotensive again today.  We have backed up on quite a bit of medication and she is now only on 5 mg of amlodipine and 100 mg Toprol with HCTZ.  I have recommended that she check her blood pressures in the morning if she feels dizzy, and if she is dizzy hold either HCTZ or amlodipine for the day. We have also changed the Toprol dose to nightly.

## 2017-05-16 NOTE — Assessment & Plan Note (Addendum)
Stable claudication.  Due to have routine follow-up evaluation of iliacs as well as blood from the ABIs.  Also has a history of some carotid artery stenosis and so we will therefore check carotid Dopplers as well.  Otherwise continue CAD related risk factor reducing medications.

## 2017-05-16 NOTE — Assessment & Plan Note (Signed)
No notable scar on Myoview.  No evidence of ischemia.  No symptomatic evidence of ischemia either on stable regimen.   No requirement for further ischemic evaluation in the absence of symptoms.

## 2017-05-30 ENCOUNTER — Encounter (HOSPITAL_COMMUNITY): Payer: Medicare Other

## 2017-06-11 ENCOUNTER — Other Ambulatory Visit: Payer: Self-pay | Admitting: Cardiology

## 2017-06-11 DIAGNOSIS — I739 Peripheral vascular disease, unspecified: Principal | ICD-10-CM

## 2017-06-11 DIAGNOSIS — I779 Disorder of arteries and arterioles, unspecified: Secondary | ICD-10-CM

## 2017-06-24 ENCOUNTER — Other Ambulatory Visit: Payer: Self-pay | Admitting: Cardiology

## 2017-06-24 DIAGNOSIS — I6523 Occlusion and stenosis of bilateral carotid arteries: Secondary | ICD-10-CM

## 2017-07-10 ENCOUNTER — Ambulatory Visit (HOSPITAL_COMMUNITY)
Admission: RE | Admit: 2017-07-10 | Discharge: 2017-07-10 | Disposition: A | Discharge status: Home / Self Care | Payer: Medicare Other | Source: Ambulatory Visit | Attending: Cardiovascular Disease | Admitting: Cardiovascular Disease

## 2017-07-10 ENCOUNTER — Encounter (HOSPITAL_COMMUNITY): Payer: Medicare Other

## 2017-07-10 DIAGNOSIS — I6523 Occlusion and stenosis of bilateral carotid arteries: Secondary | ICD-10-CM | POA: Diagnosis not present

## 2017-07-10 DIAGNOSIS — I2510 Atherosclerotic heart disease of native coronary artery without angina pectoris: Secondary | ICD-10-CM | POA: Insufficient documentation

## 2017-07-10 DIAGNOSIS — I10 Essential (primary) hypertension: Secondary | ICD-10-CM | POA: Insufficient documentation

## 2017-07-10 DIAGNOSIS — E785 Hyperlipidemia, unspecified: Secondary | ICD-10-CM | POA: Diagnosis not present

## 2017-07-12 ENCOUNTER — Telehealth: Payer: Self-pay | Admitting: *Deleted

## 2017-07-12 DIAGNOSIS — I6523 Occlusion and stenosis of bilateral carotid arteries: Secondary | ICD-10-CM

## 2017-07-12 DIAGNOSIS — I739 Peripheral vascular disease, unspecified: Secondary | ICD-10-CM

## 2017-07-12 NOTE — Telephone Encounter (Signed)
-----   Message from Leonie Man, MD sent at 07/10/2017  2:28 PM EDT ----- Carotid Doppler results: Right internal carotid 60-79% with less than 50% common carotid. Left internal carotid 40-59%.   Bilateral vertebral and subclavian arteries normal.  Follow-up in 12 months. -->  We need to keep a close eye on the right internal carotid  [name], MD  pls fwd to PCP: Lois Huxley, PA

## 2017-07-12 NOTE — Telephone Encounter (Signed)
Spoke to patient. Result given . Verbalized understanding Order placed  Routed to primary  A. Deneise Lever PA

## 2017-07-16 ENCOUNTER — Encounter (HOSPITAL_COMMUNITY): Payer: Medicare Other

## 2017-07-16 ENCOUNTER — Ambulatory Visit: Payer: Medicare Other

## 2017-07-16 ENCOUNTER — Other Ambulatory Visit: Payer: Self-pay | Admitting: *Deleted

## 2017-07-16 DIAGNOSIS — E669 Obesity, unspecified: Secondary | ICD-10-CM

## 2017-07-16 DIAGNOSIS — Z9861 Coronary angioplasty status: Secondary | ICD-10-CM

## 2017-07-16 DIAGNOSIS — E785 Hyperlipidemia, unspecified: Secondary | ICD-10-CM

## 2017-07-16 DIAGNOSIS — I739 Peripheral vascular disease, unspecified: Secondary | ICD-10-CM

## 2017-07-16 DIAGNOSIS — I2510 Atherosclerotic heart disease of native coronary artery without angina pectoris: Secondary | ICD-10-CM

## 2017-07-16 LAB — COMPREHENSIVE METABOLIC PANEL
A/G RATIO: 1.3 (ref 1.2–2.2)
ALK PHOS: 75 IU/L (ref 39–117)
ALT: 9 IU/L (ref 0–32)
AST: 15 IU/L (ref 0–40)
Albumin: 4.4 g/dL (ref 3.5–4.8)
BILIRUBIN TOTAL: 0.4 mg/dL (ref 0.0–1.2)
BUN/Creatinine Ratio: 11 — ABNORMAL LOW (ref 12–28)
BUN: 12 mg/dL (ref 8–27)
CHLORIDE: 101 mmol/L (ref 96–106)
CO2: 25 mmol/L (ref 20–29)
Calcium: 9.6 mg/dL (ref 8.7–10.3)
Creatinine, Ser: 1.1 mg/dL — ABNORMAL HIGH (ref 0.57–1.00)
GFR calc non Af Amer: 51 mL/min/{1.73_m2} — ABNORMAL LOW (ref >59)
GFR, EST AFRICAN AMERICAN: 59 mL/min/{1.73_m2} — AB (ref >59)
GLUCOSE: 85 mg/dL (ref 65–99)
Globulin, Total: 3.3 g/dL (ref 1.5–4.5)
POTASSIUM: 4.5 mmol/L (ref 3.5–5.2)
Sodium: 139 mmol/L (ref 134–144)
TOTAL PROTEIN: 7.7 g/dL (ref 6.0–8.5)

## 2017-07-16 LAB — LIPID PANEL
CHOLESTEROL TOTAL: 190 mg/dL (ref 100–199)
Chol/HDL Ratio: 2.8 ratio (ref 0.0–4.4)
HDL: 67 mg/dL (ref >39)
LDL Calculated: 102 mg/dL — ABNORMAL HIGH (ref 0–99)
Triglycerides: 104 mg/dL (ref 0–149)
VLDL Cholesterol Cal: 21 mg/dL (ref 5–40)

## 2017-07-18 ENCOUNTER — Encounter: Payer: Self-pay | Admitting: Pharmacist

## 2017-07-18 ENCOUNTER — Ambulatory Visit: Payer: Medicare Other | Admitting: Pharmacist

## 2017-07-18 DIAGNOSIS — E785 Hyperlipidemia, unspecified: Secondary | ICD-10-CM

## 2017-07-18 NOTE — Progress Notes (Signed)
Patient ID: Kayla Mcclure                 DOB: 16-Aug-1945                    MRN: [id number]     HPI: Kayla Mcclure is a 72 y.o. female patient referred to lipid clinic by [facility]. PMH is significant for NSTEMI in 2009 s/p angioplasty, hypertension, hyperlipidemia, claudication, and PAD.  Patient presents today for potential PCSK9 inhibitor initiation.  She reports feeling well and denies problems with current therapy.   Current Medications:  Atorvastatin 80mg  daily  Intolerances: none  LDL goal: < 70mg /mL  Diet: eats very little, mainly home cooked meals, like ice-cream but otherwise "healthy"   Exercise: activities of daily leaving, take scare of 5 gandkids & great-grandkids  Family History: Breast cancer (age of onset: 62) in her sister; Breast cancer (age of onset: 60) in her mother; Colon cancer (age of onset: 42) in her mother; Diabetes in her mother and sister; Heart attack (age of onset: 2) in her father; Heart attack (age of onset: 70) in her mother  Social History:  Former smoker, occasional alcohol intake (~ 3x per year)  Labs: 07/16/17: CHO 190; TG 104; HDL 67; LDL-c 102 (atorvastatin 80mg  daily)  Past Medical History:  Diagnosis Date  . Asthma  10/31/2007  . CAD (coronary artery disease) 08/05/2007   a) NSTEMI 2009: PCI to the RCA Promus DES 2.5 mm x 23 mm (3.0 mm). b) Myoview June 2017: Nonspecific ST changes. EF greater than 65%. LOW RISK, normal study. No ischemia or infarction.  . Carotid artery disease ([place])    a. 50-69% RICA by outpt dopp 04/22/13, f/u due 04/2014.  Marland Kitchen Chronic back pain   . CKD (chronic kidney disease), stage II   . Diverticulosis   04/11/2010    Colonoscopy  . Dyslipidemia, goal LDL below 70   . Essential hypertension    a. Normal renal arteries by PV angio 05/2013.  Marland Kitchen GERD (gastroesophageal reflux disease)   . GI bleed 12/2015  . History of recent blood transfusion 12-19-15, 12-20-15 and 12-21-15  . Insomnia   . Internal hemorrhoids   04/11/2010   By colonoscopy  . Iron deficiency anemia   . Myocardial infarction ([place]) 2009  . Peripheral arterial disease ([place])    a. s/p PTA and stenting of L CIA stenosis 05/2013.  Marland Kitchen Renal insufficiency   . Tobacco abuse    Quit in 05/2013    Current Outpatient Medications on File Prior to Visit  Medication Sig Dispense Refill  . amLODipine (NORVASC) 5 MG tablet Take 5 mg by mouth daily.    Marland Kitchen aspirin EC 81 MG tablet Take 81 mg by mouth daily.    Marland Kitchen atorvastatin (LIPITOR) 80 MG tablet Take 80 mg by mouth daily.     . ferrous gluconate (FERGON) 324 MG tablet Take 1 tablet (324 mg total) by mouth 2 (two) times daily with a meal. (Patient taking differently: Take 324 mg by mouth 3 (three) times daily. ) 60 tablet 3  . gabapentin (NEURONTIN) 300 MG capsule Take 1 capsule (300 mg total) by mouth 2 (two) times daily. 60 capsule 11  . hydrochlorothiazide (HYDRODIURIL) 25 MG tablet Take 25 mg by mouth daily.    . isosorbide mononitrate (IMDUR) 60 MG 24 hr tablet Take 1.5 tablets (90 mg total) by mouth daily. 135 tablet 3  . loratadine (CLARITIN) 10 MG tablet Take 10 mg  by mouth daily as needed for allergies.    . metoprolol succinate (TOPROL-XL) 100 MG 24 hr tablet Take 2 tablets (200 mg total) by mouth at bedtime. Take with or immediately following a meal. 30 tablet 11  . nitroGLYCERIN (NITROSTAT) 0.4 MG SL tablet Place 1 tablet (0.4 mg total) under the tongue every 5 (five) minutes as needed for chest pain. 25 tablet 6  . pantoprazole (PROTONIX) 40 MG tablet Take 1 tablet (40 mg total) by mouth daily. 30 tablet 0   No current facility-administered medications on file prior to visit.     No Known Allergies  Dyslipidemia, goal LDL below 70 LDL remains above goal for secondary prevention but patient reports starting ezetimibe only 3 week ago, per PCP orders.  We discussed Repatha/Praluent injections. Patient preference is to wait and see how much effect ezetimibe will have on her cholesterol  prior to initiating PCSK9i.   Repatha/Praluent dose, indication, storage, administration, common side effects, prior-authorization process, financial responsibility, and patient assistance program were discussed during counseling.  Will continue ezetimibe 10mg  and atorvastatin 80mg  daily, repeat lipid panel in 6 weeks, then consider PCSK9i if LDL remains above 70mg /dL.   Debby Clyne Rodriguez-Guzman PharmD, BCPS, [place] [id number] 07/18/2017 4:52 PM

## 2017-07-18 NOTE — Patient Instructions (Signed)
Lipid Clinic (pharmacist) Ameriah Lint/Kristin [phone]  *Continue current medication as prescribed*  *Repeat fasting blood work in 6 weeks*  *Will add Repatha/Praleunt to therapy if cholesterol remains above 70mg  in 6 weeks*  Cholesterol Cholesterol is a fat. Your body needs a small amount of cholesterol. Cholesterol (plaque) may build up in your blood vessels (arteries). That makes you more likely to have a heart attack or stroke. You cannot feel your cholesterol level. Having a blood test is the only way to find out if your level is high. Keep your test results. Work with your doctor to keep your cholesterol at a good level. What do the results mean?  Total cholesterol is how much cholesterol is in your blood.  LDL is bad cholesterol. This is the type that can build up. Try to have low LDL.  HDL is good cholesterol. It cleans your blood vessels and carries LDL away. Try to have high HDL.  Triglycerides are fat that the body can store or burn for energy. What are good levels of cholesterol?  Total cholesterol below 200.  LDL below 100 is good for people who have health risks. LDL below 70 is good for people who have very high risks.  HDL above 40 is good. It is best to have HDL of 60 or higher.  Triglycerides below 150. How can I lower my cholesterol? Diet Follow your diet program as told by your doctor.  Choose fish, white meat chicken, or [place] that is roasted or baked. Try not to eat red meat, fried foods, sausage, or lunch meats.  Eat lots of fresh fruits and vegetables.  Choose whole grains, beans, pasta, potatoes, and cereals.  Choose olive oil, corn oil, or canola oil. Only use small amounts.  Try not to eat butter, mayonnaise, shortening, or palm kernel oils.  Try not to eat foods with trans fats.  Choose low-fat or nonfat dairy foods. ? Drink skim or nonfat milk. ? Eat low-fat or nonfat yogurt and cheeses. ? Try not to drink whole milk or cream. ? Try not  to eat ice cream, egg yolks, or full-fat cheeses.  Healthy desserts include angel food cake, ginger snaps, animal crackers, hard candy, popsicles, and low-fat or nonfat frozen yogurt. Try not to eat pastries, cakes, pies, and cookies.  Exercise Follow your exercise program as told by your doctor.  Be more active. Try gardening, walking, and taking the stairs.  Ask your doctor about ways that you can be more active.  Medicine  Take over-the-counter and prescription medicines only as told by your doctor. This information is not intended to replace advice given to you by your health care provider. Make sure you discuss any questions you have with your health care provider. Document Released: 06/15/2008 Document Revised: 10/19/2015 Document Reviewed: 09/29/2015 Elsevier Interactive Patient Education  [employer].

## 2017-07-18 NOTE — Assessment & Plan Note (Signed)
LDL remains above goal for secondary prevention but patient reports starting ezetimibe only 3 week ago, per PCP orders.  We discussed Repatha/Praluent injections. Patient preference is to wait and see how much effect ezetimibe will have on her cholesterol prior to initiating PCSK9i.   Repatha/Praluent dose, indication, storage, administration, common side effects, prior-authorization process, financial responsibility, and patient assistance program were discussed during counseling.  Will continue ezetimibe 10mg  and atorvastatin 80mg  daily, repeat lipid panel in 6 weeks, then consider PCSK9i if LDL remains above 70mg /dL.

## 2017-07-25 ENCOUNTER — Ambulatory Visit (HOSPITAL_COMMUNITY)
Admission: RE | Admit: 2017-07-25 | Discharge: 2017-07-25 | Disposition: A | Discharge status: Home / Self Care | Payer: Medicare Other | Source: Ambulatory Visit | Attending: Cardiology | Admitting: Cardiology

## 2017-07-25 DIAGNOSIS — E785 Hyperlipidemia, unspecified: Secondary | ICD-10-CM | POA: Diagnosis not present

## 2017-07-25 DIAGNOSIS — I2510 Atherosclerotic heart disease of native coronary artery without angina pectoris: Secondary | ICD-10-CM | POA: Diagnosis not present

## 2017-07-25 DIAGNOSIS — I708 Atherosclerosis of other arteries: Secondary | ICD-10-CM | POA: Diagnosis not present

## 2017-07-25 DIAGNOSIS — Z87891 Personal history of nicotine dependence: Secondary | ICD-10-CM | POA: Diagnosis not present

## 2017-07-25 DIAGNOSIS — I739 Peripheral vascular disease, unspecified: Secondary | ICD-10-CM

## 2017-07-25 DIAGNOSIS — I10 Essential (primary) hypertension: Secondary | ICD-10-CM | POA: Diagnosis not present

## 2017-08-22 ENCOUNTER — Telehealth: Payer: Self-pay | Admitting: Pharmacist

## 2017-08-22 DIAGNOSIS — E785 Hyperlipidemia, unspecified: Secondary | ICD-10-CM

## 2017-08-22 NOTE — Telephone Encounter (Signed)
Patient taking lipitor and zetia without problems. Will repeat fasting lipids next week.   Order in [place]

## 2017-08-27 LAB — LIPID PANEL
CHOLESTEROL TOTAL: 106 mg/dL (ref 100–199)
Chol/HDL Ratio: 2 ratio (ref 0.0–4.4)
HDL: 52 mg/dL (ref >39)
LDL Calculated: 39 mg/dL (ref 0–99)
Triglycerides: 74 mg/dL (ref 0–149)
VLDL Cholesterol Cal: 15 mg/dL (ref 5–40)

## 2017-09-11 ENCOUNTER — Telehealth: Payer: Self-pay | Admitting: Pharmacist

## 2017-09-11 NOTE — Telephone Encounter (Signed)
-----   Message from Leonie Man, MD sent at 09/10/2017  2:42 PM EDT ----- Cholesterol panel looks really good.  Total cholesterol 106, HDL 52, LDL 39 and triglycerides 74.  This is a significant improvement. For now, would simply continue atorvastatin and Zetia if we can keep this up.  thnx  DH [name], MD   pls fwd to PCP: Lois Huxley, PA

## 2017-09-11 NOTE — Telephone Encounter (Signed)
Results and recommendations given to patient.

## 2017-09-20 ENCOUNTER — Ambulatory Visit
Admission: RE | Admit: 2017-09-20 | Discharge: 2017-09-20 | Disposition: A | Discharge status: Home / Self Care | Payer: Medicare Other | Source: Ambulatory Visit | Attending: Family Medicine | Admitting: Family Medicine

## 2017-09-20 ENCOUNTER — Other Ambulatory Visit: Payer: Self-pay | Admitting: Family Medicine

## 2017-09-20 DIAGNOSIS — M79604 Pain in right leg: Secondary | ICD-10-CM

## 2017-11-22 ENCOUNTER — Ambulatory Visit: Payer: Medicare Other | Admitting: Cardiovascular Disease

## 2017-12-09 ENCOUNTER — Other Ambulatory Visit: Payer: Self-pay | Admitting: Family Medicine

## 2017-12-09 DIAGNOSIS — Z1231 Encounter for screening mammogram for malignant neoplasm of breast: Secondary | ICD-10-CM

## 2017-12-13 ENCOUNTER — Ambulatory Visit
Admission: RE | Admit: 2017-12-13 | Discharge: 2017-12-13 | Disposition: A | Discharge status: Home / Self Care | Payer: Medicare Other | Source: Ambulatory Visit | Attending: Family Medicine | Admitting: Family Medicine

## 2017-12-13 DIAGNOSIS — Z1231 Encounter for screening mammogram for malignant neoplasm of breast: Secondary | ICD-10-CM

## 2017-12-31 ENCOUNTER — Ambulatory Visit (INDEPENDENT_AMBULATORY_CARE_PROVIDER_SITE_OTHER): Payer: Medicare Other | Admitting: Cardiovascular Disease

## 2017-12-31 ENCOUNTER — Encounter: Payer: Self-pay | Admitting: Cardiovascular Disease

## 2017-12-31 VITALS — BP 118/78 | HR 72 | Ht 59.0 in | Wt 146.2 lb

## 2017-12-31 DIAGNOSIS — I6523 Occlusion and stenosis of bilateral carotid arteries: Secondary | ICD-10-CM

## 2017-12-31 DIAGNOSIS — I739 Peripheral vascular disease, unspecified: Secondary | ICD-10-CM | POA: Diagnosis not present

## 2017-12-31 NOTE — Assessment & Plan Note (Signed)
History of moderate bilateral ICA stenosis by duplex ultrasound recently performed 07/10/2017 which have remained stable and will be followed on an annual basis.

## 2017-12-31 NOTE — Patient Instructions (Signed)
Medication Instructions:  Your physician recommends that you continue on your current medications as directed. Please refer to the Current Medication list given to you today.   Labwork: none  Testing/Procedures: Your physician has requested that you have an ankle brachial index (ABI). During this test an ultrasound and blood pressure cuff are used to evaluate the arteries that supply the arms and legs with blood. Allow thirty minutes for this exam. There are no restrictions or special instructions.  Your physician has requested that you have an aorta/iliac duplex. During this test, an ultrasound is used to evaluate blood flow to the aorta and iliac arteries. Allow one hour for this exam. Do not eat after midnight the day before and avoid carbonated beverages.    Your physician has requested that you have a carotid duplex. This test is an ultrasound of the carotid arteries in your neck. It looks at blood flow through these arteries that supply the brain with blood. Allow one hour for this exam. There are no restrictions or special instructions.  Schedule all for April 2020  Follow-Up: Your physician wants you to follow-up in: 12 months with Dr. Gwenlyn Found. You will receive a reminder letter in the mail two months in advance. If you don't receive a letter, please call our office to schedule the follow-up appointment.   Any Other Special Instructions Will Be Listed Below (If Applicable).     If you need a refill on your cardiac medications before your next appointment, please call your pharmacy.

## 2017-12-31 NOTE — Assessment & Plan Note (Signed)
History of peripheral arterial disease post left common and external iliac stenting by myself 05/04/2013 with a nitinol self-expanding stent.  Her left leg claudication resolved at that time.  Recent lower extremity arterial Doppler studies performed 07/25/2017 revealed normal ABIs and a patent left iliac stent.  She does complain of symptoms symptoms on the right side which do not sound vascular.

## 2017-12-31 NOTE — Progress Notes (Signed)
12/31/2017 Kayla Mcclure   Jun 15, 1945  [id number]  Primary Physician Lois Huxley, PA Primary Cardiologist: Lorretta Harp MD FACP, [place], [place], [place]  HPI:  Kayla Mcclure is a 72 y.o. mild to moderately overweight African-American female who I last saw her in the office 07/08/2015.Marland Kitchen She has a history of CAD, hypertension, tobacco abuse and hyperlipidemia. She does have lifestyle limiting claudication with Dopplers that showed stenoses in the right common femoral and left iliac artery. She denies chest pain or shortness of breath. She underwent percutaneous revascularization of her left common and external iliac artery by myself on 05/04/13 with excellent angiographic and clinical result. Her postprocedure Dopplers performed several days later were entirely normal. She also has moderate bilateral internal carotid artery stenosis. She stopped smoking a year ago. Since I saw her a year ago she has remained stable.  Her left lower extremity claudication resolved at the time of stenting.  She has some atypical right-sided symptoms.  Recent lower extremity Dopplers performed in April of this year revealed normal ABIs with a patent iliac stent and a carotid Dopplers show stable moderate bilateral ICA stenosis.    Current Meds  Medication Sig  . amLODipine (NORVASC) 5 MG tablet Take 5 mg by mouth daily.  Marland Kitchen aspirin EC 81 MG tablet Take 81 mg by mouth daily.  Marland Kitchen atorvastatin (LIPITOR) 80 MG tablet Take 80 mg by mouth daily.   Marland Kitchen ezetimibe (ZETIA) 10 MG tablet Take 10 mg by mouth daily.  . ferrous gluconate (FERGON) 324 MG tablet Take 1 tablet (324 mg total) by mouth 2 (two) times daily with a meal. (Patient taking differently: Take 324 mg by mouth 3 (three) times daily. )  . gabapentin (NEURONTIN) 300 MG capsule Take 1 capsule (300 mg total) by mouth 2 (two) times daily.  . hydrochlorothiazide (HYDRODIURIL) 25 MG tablet Take 25 mg by mouth daily.  . isosorbide mononitrate (IMDUR) 60 MG 24 hr  tablet Take 1.5 tablets (90 mg total) by mouth daily.  Marland Kitchen loratadine (CLARITIN) 10 MG tablet Take 10 mg by mouth daily as needed for allergies.  . metoprolol succinate (TOPROL-XL) 100 MG 24 hr tablet Take 2 tablets (200 mg total) by mouth at bedtime. Take with or immediately following a meal.  . nitroGLYCERIN (NITROSTAT) 0.4 MG SL tablet Place 1 tablet (0.4 mg total) under the tongue every 5 (five) minutes as needed for chest pain.  . pantoprazole (PROTONIX) 40 MG tablet Take 1 tablet (40 mg total) by mouth daily.     No Known Allergies  Social History   Socioeconomic History  . Marital status: Married    Spouse name: Not on file  . Number of children: 1  . Years of education: Not on file  . Highest education level: Not on file  Occupational History    Employer: RETIRED  Social Needs  . Financial resource strain: Not on file  . Food insecurity:    Worry: Not on file    Inability: Not on file  . Transportation needs:    Medical: Not on file    Non-medical: Not on file  Tobacco Use  . Smoking status: Former Smoker    Types: Cigarettes    Last attempt to quit: 05/04/2013    Years since quitting: 4.6  . Smokeless tobacco: Never Used  Substance and Sexual Activity  . Alcohol use: Yes    Comment: occasionally 3 times a year  . Drug use: No  . Sexual  activity: Never    Partners: Male    Birth control/protection: None  Lifestyle  . Physical activity:    Days per week: Not on file    Minutes per session: Not on file  . Stress: Not on file  Relationships  . Social connections:    Talks on phone: Not on file    Gets together: Not on file    Attends religious service: Not on file    Active member of club or organization: Not on file    Attends meetings of clubs or organizations: Not on file    Relationship status: Not on file  . Intimate partner violence:    Fear of current or ex partner: Not on file    Emotionally abused: Not on file    Physically abused: Not on file     Forced sexual activity: Not on file  Other Topics Concern  . Not on file  Social History Narrative   Married. Has one child. Grandmother for a great-grandmother and 1.   No real exercise.   Occasional alcohol consumption.   Quit smoking inf Feb 2015 -- after L Iliac Stent   Right-handed   Caffeine: 1 cup every morning     Review of Systems: General: negative for chills, fever, night sweats or weight changes.  Cardiovascular: negative for chest pain, dyspnea on exertion, edema, orthopnea, palpitations, paroxysmal nocturnal dyspnea or shortness of breath Dermatological: negative for rash Respiratory: negative for cough or wheezing Urologic: negative for hematuria Abdominal: negative for nausea, vomiting, diarrhea, bright red blood per rectum, melena, or hematemesis Neurologic: negative for visual changes, syncope, or dizziness All other systems reviewed and are otherwise negative except as noted above.    Blood pressure 118/78, pulse 72, height 4\' 11"  (1.499 m), weight 146 lb 3.2 oz (66.3 kg).  General appearance: alert and no distress Neck: no adenopathy, no carotid bruit, no JVD, supple, symmetrical, trachea midline and thyroid not enlarged, symmetric, no tenderness/mass/nodules Lungs: clear to auscultation bilaterally Heart: regular rate and rhythm, S1, S2 normal, no murmur, click, rub or gallop Extremities: extremities normal, atraumatic, no cyanosis or edema Pulses: 2+ and symmetric Skin: Skin color, texture, turgor normal. No rashes or lesions Neurologic: Alert and oriented X 3, normal strength and tone. Normal symmetric reflexes. Normal coordination and gait  EKG sinus rhythm at 72 with nonspecific ST and T wave changes.  Personally reviewed this EKG.  ASSESSMENT AND PLAN:   PAD (peripheral artery disease) - bilateral CIA & CFA 50-69%; Claudication History of peripheral arterial disease post left common and external iliac stenting by myself 05/04/2013 with a nitinol  self-expanding stent.  Her left leg claudication resolved at that time.  Recent lower extremity arterial Doppler studies performed 07/25/2017 revealed normal ABIs and a patent left iliac stent.  She does complain of symptoms symptoms on the right side which do not sound vascular.  Bilateral carotid artery disease ([facility]) History of moderate bilateral ICA stenosis by duplex ultrasound recently performed 07/10/2017 which have remained stable and will be followed on an annual basis.      Lorretta Harp MD FACP,FACC,FAHA, [facility] 12/31/2017 9:22 AM

## 2018-01-17 ENCOUNTER — Other Ambulatory Visit: Payer: Self-pay | Admitting: Nephrology

## 2018-01-17 DIAGNOSIS — I739 Peripheral vascular disease, unspecified: Secondary | ICD-10-CM

## 2018-01-17 DIAGNOSIS — N2581 Secondary hyperparathyroidism of renal origin: Secondary | ICD-10-CM

## 2018-01-17 DIAGNOSIS — I2510 Atherosclerotic heart disease of native coronary artery without angina pectoris: Secondary | ICD-10-CM

## 2018-01-17 DIAGNOSIS — I129 Hypertensive chronic kidney disease with stage 1 through stage 4 chronic kidney disease, or unspecified chronic kidney disease: Secondary | ICD-10-CM

## 2018-01-17 DIAGNOSIS — N183 Chronic kidney disease, stage 3 unspecified: Secondary | ICD-10-CM

## 2018-01-17 DIAGNOSIS — Z720 Tobacco use: Secondary | ICD-10-CM

## 2018-01-17 DIAGNOSIS — R7303 Prediabetes: Secondary | ICD-10-CM

## 2018-01-17 DIAGNOSIS — E785 Hyperlipidemia, unspecified: Secondary | ICD-10-CM

## 2018-01-17 DIAGNOSIS — D649 Anemia, unspecified: Secondary | ICD-10-CM

## 2018-01-17 DIAGNOSIS — K274 Chronic or unspecified peptic ulcer, site unspecified, with hemorrhage: Secondary | ICD-10-CM

## 2018-01-17 DIAGNOSIS — J449 Chronic obstructive pulmonary disease, unspecified: Secondary | ICD-10-CM

## 2018-01-23 ENCOUNTER — Other Ambulatory Visit: Payer: Medicare Other

## 2018-01-24 ENCOUNTER — Ambulatory Visit
Admission: RE | Admit: 2018-01-24 | Discharge: 2018-01-24 | Disposition: A | Discharge status: Home / Self Care | Payer: Medicare Other | Source: Ambulatory Visit | Attending: Nephrology | Admitting: Nephrology

## 2018-01-24 DIAGNOSIS — I739 Peripheral vascular disease, unspecified: Secondary | ICD-10-CM

## 2018-01-24 DIAGNOSIS — I129 Hypertensive chronic kidney disease with stage 1 through stage 4 chronic kidney disease, or unspecified chronic kidney disease: Secondary | ICD-10-CM

## 2018-01-24 DIAGNOSIS — I2510 Atherosclerotic heart disease of native coronary artery without angina pectoris: Secondary | ICD-10-CM

## 2018-01-24 DIAGNOSIS — E785 Hyperlipidemia, unspecified: Secondary | ICD-10-CM

## 2018-01-24 DIAGNOSIS — Z720 Tobacco use: Secondary | ICD-10-CM

## 2018-01-24 DIAGNOSIS — N183 Chronic kidney disease, stage 3 unspecified: Secondary | ICD-10-CM

## 2018-01-24 DIAGNOSIS — R7303 Prediabetes: Secondary | ICD-10-CM

## 2018-01-24 DIAGNOSIS — K274 Chronic or unspecified peptic ulcer, site unspecified, with hemorrhage: Secondary | ICD-10-CM

## 2018-01-24 DIAGNOSIS — N2581 Secondary hyperparathyroidism of renal origin: Secondary | ICD-10-CM

## 2018-01-24 DIAGNOSIS — J449 Chronic obstructive pulmonary disease, unspecified: Secondary | ICD-10-CM

## 2018-01-24 DIAGNOSIS — D649 Anemia, unspecified: Secondary | ICD-10-CM

## 2018-07-10 ENCOUNTER — Encounter (HOSPITAL_COMMUNITY): Payer: Medicare Other

## 2018-07-10 ENCOUNTER — Inpatient Hospital Stay (HOSPITAL_COMMUNITY): Admission: RE | Admit: 2018-07-10 | Payer: Medicare Other | Source: Ambulatory Visit

## 2018-08-13 ENCOUNTER — Other Ambulatory Visit: Payer: Self-pay | Admitting: Cardiovascular Disease

## 2018-08-13 DIAGNOSIS — Z95820 Peripheral vascular angioplasty status with implants and grafts: Secondary | ICD-10-CM

## 2018-08-13 DIAGNOSIS — I739 Peripheral vascular disease, unspecified: Secondary | ICD-10-CM

## 2018-08-18 ENCOUNTER — Other Ambulatory Visit (HOSPITAL_COMMUNITY): Payer: Self-pay | Admitting: Cardiovascular Disease

## 2018-08-18 ENCOUNTER — Ambulatory Visit (HOSPITAL_BASED_OUTPATIENT_CLINIC_OR_DEPARTMENT_OTHER)
Admission: RE | Admit: 2018-08-18 | Discharge: 2018-08-18 | Disposition: A | Discharge status: Home / Self Care | Payer: Medicare Other | Source: Ambulatory Visit | Attending: Cardiovascular Disease | Admitting: Cardiovascular Disease

## 2018-08-18 ENCOUNTER — Other Ambulatory Visit: Payer: Self-pay

## 2018-08-18 ENCOUNTER — Ambulatory Visit (HOSPITAL_COMMUNITY)
Admission: RE | Admit: 2018-08-18 | Discharge: 2018-08-18 | Disposition: A | Discharge status: Home / Self Care | Payer: Medicare Other | Source: Ambulatory Visit | Attending: Cardiology | Admitting: Cardiology

## 2018-08-18 DIAGNOSIS — I739 Peripheral vascular disease, unspecified: Secondary | ICD-10-CM | POA: Diagnosis present

## 2018-08-18 DIAGNOSIS — I6523 Occlusion and stenosis of bilateral carotid arteries: Secondary | ICD-10-CM | POA: Diagnosis present

## 2018-08-18 DIAGNOSIS — Z95820 Peripheral vascular angioplasty status with implants and grafts: Secondary | ICD-10-CM | POA: Diagnosis not present

## 2018-08-18 DIAGNOSIS — Z95828 Presence of other vascular implants and grafts: Secondary | ICD-10-CM

## 2018-08-20 ENCOUNTER — Other Ambulatory Visit: Payer: Self-pay

## 2018-08-20 DIAGNOSIS — I739 Peripheral vascular disease, unspecified: Secondary | ICD-10-CM

## 2018-08-20 DIAGNOSIS — I6523 Occlusion and stenosis of bilateral carotid arteries: Secondary | ICD-10-CM

## 2018-12-31 ENCOUNTER — Other Ambulatory Visit: Payer: Self-pay

## 2018-12-31 ENCOUNTER — Ambulatory Visit (INDEPENDENT_AMBULATORY_CARE_PROVIDER_SITE_OTHER): Payer: Medicare Other | Admitting: Cardiovascular Disease

## 2018-12-31 ENCOUNTER — Encounter: Payer: Self-pay | Admitting: Cardiovascular Disease

## 2018-12-31 DIAGNOSIS — I10 Essential (primary) hypertension: Secondary | ICD-10-CM

## 2018-12-31 DIAGNOSIS — I2510 Atherosclerotic heart disease of native coronary artery without angina pectoris: Secondary | ICD-10-CM | POA: Diagnosis not present

## 2018-12-31 DIAGNOSIS — I739 Peripheral vascular disease, unspecified: Secondary | ICD-10-CM | POA: Diagnosis not present

## 2018-12-31 DIAGNOSIS — Z87891 Personal history of nicotine dependence: Secondary | ICD-10-CM

## 2018-12-31 DIAGNOSIS — I6523 Occlusion and stenosis of bilateral carotid arteries: Secondary | ICD-10-CM

## 2018-12-31 DIAGNOSIS — E785 Hyperlipidemia, unspecified: Secondary | ICD-10-CM

## 2018-12-31 DIAGNOSIS — Z9861 Coronary angioplasty status: Secondary | ICD-10-CM

## 2018-12-31 NOTE — Assessment & Plan Note (Signed)
History of bilateral carotid disease by duplex ultrasound 08/18/2018 with moderately severe right and moderate left ICA stenosis.  This will be repeated in 1 year.

## 2018-12-31 NOTE — Assessment & Plan Note (Signed)
History of PAD status post right common iliac artery PTA and stenting by myself 05/04/2013.  Recent Dopplers performed 08/18/2018 revealed normal ABIs bilaterally and normal velocities.  Patient denies claudication.

## 2018-12-31 NOTE — Assessment & Plan Note (Signed)
History of CAD status post RCA PCI and drug-eluting stenting by Dr. Ellyn Hack in 2009.  She is been asymptomatic since.

## 2018-12-31 NOTE — Assessment & Plan Note (Signed)
History of essential hypertension with blood pressure measured today 134/76.  She is on amlodipine and metoprolol as well as hydrochlorothiazide.

## 2018-12-31 NOTE — Patient Instructions (Addendum)
Medication Instructions:  Your physician recommends that you continue on your current medications as directed. Please refer to the Current Medication list given to you today.  If you need a refill on your cardiac medications before your next appointment, please call your pharmacy.   Lab work: NONE If you have labs (blood work) drawn today and your tests are completely normal, you will receive your results only by: Marland Kitchen MyChart Message (if you have MyChart) OR . A paper copy in the mail If you have any lab test that is abnormal or we need to change your treatment, we will call you to review the results.  Testing/Procedures: Your physician has requested that you have a carotid duplex. This test is an ultrasound of the carotid arteries in your neck. It looks at blood flow through these arteries that supply the brain with blood. Allow one hour for this exam. There are no restrictions or special instructions. DUE MAY 2021  Your physician has requested that you have an aorta/iliac duplex. During this test, an ultrasound is used to evaluate blood flow to the aorta and iliac arteries. Allow one hour for this exam. Do not eat after midnight the day before and avoid carbonated beverages. DUE MAY 2021  Your physician has requested that you have an ankle brachial index (ABI). During this test an ultrasound and blood pressure cuff are used to evaluate the arteries that supply the arms and legs with blood. Allow thirty minutes for this exam. There are no restrictions or special instructions. DUE MAY 2021   Follow-Up: At [facility], you and your health needs are our priority.  As part of our continuing mission to provide you with exceptional heart care, we have created designated Provider Care Teams.  These Care Teams include your primary Cardiologist (physician) and Advanced Practice Providers (APPs -  Physician Assistants and Nurse Practitioners) who all work together to provide you with the care you  need, when you need it. You will need a follow up appointment in 12 months with Kayla Mcclure.  Please call our office 2 months in advance to schedule this/each appointment.

## 2018-12-31 NOTE — Assessment & Plan Note (Signed)
Patient stopped smoking several years ago but restarted recently now smoking 1/2 pack/day.

## 2018-12-31 NOTE — Assessment & Plan Note (Signed)
History of dyslipidemia on high-dose statin therapy and Zetia with lipid profile performed 01/09/2018 revealing total cholesterol 145, LDL 67 and HDL of 63.

## 2018-12-31 NOTE — Progress Notes (Signed)
12/31/2018 Kayla Mcclure   [birth year]  [id number]  Primary Physician Lois Huxley, PA Primary Cardiologist: Lorretta Harp MD FACP, [place], [place], [place]  HPI:  Kayla Mcclure is a 73 y.o.  mild to moderately overweight African-American female who I last saw her in the office 07/08/2015.Marland Kitchen She has a history of CAD, hypertension, tobacco abuse and hyperlipidemia. She does have lifestyle limiting claudication with Dopplers that showed stenoses in the right common femoral and left iliac artery. She denies chest pain or shortness of breath. She underwent percutaneous revascularization of her left common and external iliac artery by myself on 05/04/13 with excellent angiographic and clinical result. Her postprocedure Dopplers performed several days later were entirely normal. She also has moderate bilateral internal carotid artery stenosis. She stopped smoking a year ago. Since I saw her a year ago she has remained stable.  Her left lower extremity claudication resolved at the time of stenting.  She has some atypical right-sided symptoms.  Recent lower extremity Dopplers performed in April of this year revealed normal ABIs with a patent iliac stent and a carotid Dopplers show stable moderate bilateral ICA stenosis.  Since I saw her a year ago she is remained stable.  She still has some atypical right-sided chest pain.  She denies claudication.  She has unfortunately started smoking again a half a pack a day.   Current Meds  Medication Sig  . amLODipine (NORVASC) 5 MG tablet Take 5 mg by mouth daily.  Marland Kitchen aspirin EC 81 MG tablet Take 81 mg by mouth daily.  Marland Kitchen atorvastatin (LIPITOR) 80 MG tablet Take 80 mg by mouth daily.   Marland Kitchen ezetimibe (ZETIA) 10 MG tablet Take 10 mg by mouth daily.  . ferrous gluconate (FERGON) 324 MG tablet Take 1 tablet (324 mg total) by mouth 2 (two) times daily with a meal. (Patient taking differently: Take 324 mg by mouth 3 (three) times daily. )  . gabapentin (NEURONTIN) 300 MG  capsule Take 1 capsule (300 mg total) by mouth 2 (two) times daily.  . hydrochlorothiazide (HYDRODIURIL) 25 MG tablet Take 25 mg by mouth daily.  . isosorbide mononitrate (IMDUR) 60 MG 24 hr tablet Take 1.5 tablets (90 mg total) by mouth daily.  Marland Kitchen loratadine (CLARITIN) 10 MG tablet Take 10 mg by mouth daily as needed for allergies.  . metoprolol succinate (TOPROL-XL) 100 MG 24 hr tablet Take 2 tablets (200 mg total) by mouth at bedtime. Take with or immediately following a meal.  . nitroGLYCERIN (NITROSTAT) 0.4 MG SL tablet Place 1 tablet (0.4 mg total) under the tongue every 5 (five) minutes as needed for chest pain.  . pantoprazole (PROTONIX) 40 MG tablet Take 1 tablet (40 mg total) by mouth daily.     No Known Allergies  Social History   Socioeconomic History  . Marital status: Married    Spouse name: Not on file  . Number of children: 1  . Years of education: Not on file  . Highest education level: Not on file  Occupational History    Employer: RETIRED  Social Needs  . Financial resource strain: Not on file  . Food insecurity    Worry: Not on file    Inability: Not on file  . Transportation needs    Medical: Not on file    Non-medical: Not on file  Tobacco Use  . Smoking status: Former Smoker    Types: Cigarettes    Quit date: 05/04/2013    Years  since quitting: 5.6  . Smokeless tobacco: Never Used  Substance and Sexual Activity  . Alcohol use: Yes    Comment: occasionally 3 times a year  . Drug use: No  . Sexual activity: Never    Partners: Male    Birth control/protection: None  Lifestyle  . Physical activity    Days per week: Not on file    Minutes per session: Not on file  . Stress: Not on file  Relationships  . Social Herbalist on phone: Not on file    Gets together: Not on file    Attends religious service: Not on file    Active member of club or organization: Not on file    Attends meetings of clubs or organizations: Not on file     Relationship status: Not on file  . Intimate partner violence    Fear of current or ex partner: Not on file    Emotionally abused: Not on file    Physically abused: Not on file    Forced sexual activity: Not on file  Other Topics Concern  . Not on file  Social History Narrative   Married. Has one child. Grandmother for a great-grandmother and 1.   No real exercise.   Occasional alcohol consumption.   Quit smoking inf Feb 2015 -- after L Iliac Stent   Right-handed   Caffeine: 1 cup every morning     Review of Systems: General: negative for chills, fever, night sweats or weight changes.  Cardiovascular: negative for chest pain, dyspnea on exertion, edema, orthopnea, palpitations, paroxysmal nocturnal dyspnea or shortness of breath Dermatological: negative for rash Respiratory: negative for cough or wheezing Urologic: negative for hematuria Abdominal: negative for nausea, vomiting, diarrhea, bright red blood per rectum, melena, or hematemesis Neurologic: negative for visual changes, syncope, or dizziness All other systems reviewed and are otherwise negative except as noted above.    Blood pressure 134/76, pulse 87, temperature (!) 97.5 F (36.4 C), weight 137 lb (62.1 kg).  General appearance: alert and no distress Neck: no adenopathy, no JVD, supple, symmetrical, trachea midline, thyroid not enlarged, symmetric, no tenderness/mass/nodules and Soft bilateral carotid bruits Lungs: clear to auscultation bilaterally Heart: regular rate and rhythm, S1, S2 normal, no murmur, click, rub or gallop Extremities: extremities normal, atraumatic, no cyanosis or edema Pulses: 2+ and symmetric Skin: Skin color, texture, turgor normal. No rashes or lesions Neurologic: Alert and oriented X 3, normal strength and tone. Normal symmetric reflexes. Normal coordination and gait  EKG sinus rhythm 87 with nonspecific ST and T wave changes.  I personally reviewed this EKG.  ASSESSMENT AND PLAN:    Former heavy cigarette smoker (20-39 per day) Patient stopped smoking several years ago but restarted recently now smoking 1/2 pack/day.  Essential hypertension, benign History of essential hypertension with blood pressure measured today 134/76.  She is on amlodipine and metoprolol as well as hydrochlorothiazide.  CAD S/P percutaneous coronary angioplasty -- Promus DES 2.5 mm x 20 mm postdilated to 3 mm History of CAD status post RCA PCI and drug-eluting stenting by Dr. Ellyn Hack in 2009.  She is been asymptomatic since.  PAD (peripheral artery disease) - bilateral CIA & CFA 50-69%; Claudication History of PAD status post right common iliac artery PTA and stenting by myself 05/04/2013.  Recent Dopplers performed 08/18/2018 revealed normal ABIs bilaterally and normal velocities.  Patient denies claudication.  Dyslipidemia, goal LDL below 70 History of dyslipidemia on high-dose statin therapy and Zetia with lipid  profile performed 01/09/2018 revealing total cholesterol 145, LDL 67 and HDL of 63.  Bilateral carotid artery disease (HCC) History of bilateral carotid disease by duplex ultrasound 08/18/2018 with moderately severe right and moderate left ICA stenosis.  This will be repeated in 1 year.      Lorretta Harp MD FACP,FACC,FAHA, [facility] 12/31/2018 4:49 PM

## 2019-01-02 ENCOUNTER — Ambulatory Visit: Payer: Medicare Other | Admitting: Cardiovascular Disease

## 2019-01-15 ENCOUNTER — Other Ambulatory Visit: Payer: Self-pay | Admitting: Family Medicine

## 2019-01-15 DIAGNOSIS — Z1231 Encounter for screening mammogram for malignant neoplasm of breast: Secondary | ICD-10-CM

## 2019-01-15 DIAGNOSIS — E2839 Other primary ovarian failure: Secondary | ICD-10-CM

## 2019-02-04 ENCOUNTER — Telehealth (INDEPENDENT_AMBULATORY_CARE_PROVIDER_SITE_OTHER): Payer: Medicare Other | Admitting: Cardiology

## 2019-02-04 ENCOUNTER — Encounter: Payer: Self-pay | Admitting: Cardiology

## 2019-02-04 ENCOUNTER — Telehealth: Payer: Self-pay | Admitting: *Deleted

## 2019-02-04 VITALS — BP 186/85 | HR 79 | Ht 59.0 in | Wt 136.0 lb

## 2019-02-04 DIAGNOSIS — N182 Chronic kidney disease, stage 2 (mild): Secondary | ICD-10-CM

## 2019-02-04 DIAGNOSIS — I200 Unstable angina: Secondary | ICD-10-CM | POA: Insufficient documentation

## 2019-02-04 DIAGNOSIS — I209 Angina pectoris, unspecified: Secondary | ICD-10-CM

## 2019-02-04 DIAGNOSIS — I10 Essential (primary) hypertension: Secondary | ICD-10-CM

## 2019-02-04 DIAGNOSIS — R0600 Dyspnea, unspecified: Secondary | ICD-10-CM

## 2019-02-04 DIAGNOSIS — I25119 Atherosclerotic heart disease of native coronary artery with unspecified angina pectoris: Secondary | ICD-10-CM

## 2019-02-04 DIAGNOSIS — I2510 Atherosclerotic heart disease of native coronary artery without angina pectoris: Secondary | ICD-10-CM

## 2019-02-04 DIAGNOSIS — Z9861 Coronary angioplasty status: Secondary | ICD-10-CM

## 2019-02-04 DIAGNOSIS — E785 Hyperlipidemia, unspecified: Secondary | ICD-10-CM

## 2019-02-04 DIAGNOSIS — R0609 Other forms of dyspnea: Secondary | ICD-10-CM

## 2019-02-04 MED ORDER — NITROGLYCERIN 0.4 MG SL SUBL: 0.4000 mg | SUBLINGUAL_TABLET | SUBLINGUAL | 6 refills | Status: DC | PRN

## 2019-02-04 MED ORDER — AMLODIPINE BESYLATE 10 MG PO TABS: 10.0000 mg | ORAL_TABLET | Freq: Every day | ORAL | 3 refills | Status: DC

## 2019-02-04 MED ORDER — CLOPIDOGREL BISULFATE 75 MG PO TABS: 75.0000 mg | ORAL_TABLET | Freq: Every day | ORAL | 3 refills | Status: DC

## 2019-02-04 MED ORDER — FUROSEMIDE 20 MG PO TABS: 20.0000 mg | ORAL_TABLET | Freq: Every day | ORAL | 3 refills | Status: DC

## 2019-02-04 NOTE — H&P (View-Only) (Signed)
Virtual Visit via Telephone Note   This visit type was conducted due to national recommendations for restrictions regarding the COVID-19 Pandemic (e.g. social distancing) in an effort to limit this patient's exposure and mitigate transmission in our community.  Due to her co-morbid illnesses, this patient is at least at moderate risk for complications without adequate follow up.  This format is felt to be most appropriate for this patient at this time.  The patient did not have access to video technology/had technical difficulties with video requiring transitioning to audio format only (telephone).  All issues noted in this document were discussed and addressed.  No physical exam could be performed with this format.  Please refer to the patient's chart for her  consent to telehealth for [facility].   Patient has given verbal permission to conduct this visit via virtual appointment and to bill insurance 02/04/2019 12:22 PM     Evaluation Performed:  Follow-up visit  Date:  02/04/2019   ID:  TRENACE COUGHLIN, DOB Dec 12, 1945, MRN [id number]  Patient Location: Home Provider Location: Home  PCP:  Lois Huxley, PA  Cardiologist:   [name], MD  Electrophysiologist:  None   Chief Complaint:   Chief Complaint  Patient presents with  . Follow-up    Multiple complaints  . Shortness of Breath    With less and less amount of exertion  . Chest Pain    Chest tightness with exertion, also left-sided "breast pain "  . Coronary Artery Disease    History of Present Illness:    YARELLY KUBA is a 73 y.o. female with PMH notable for CAD having PCI in the setting of non-STEMI back in 2009 (negative Myoview most recently in June 2017), as well as PAD and carotid disease (followed by Dr. Gwenlyn Found) who presents via audio/video conferencing for a telehealth visit today. -- Annual f/u delayed due to [place].  Tola D Barren was last seen by me in February 2019.  Stable from a cardiac standpoint.   Did note some possible claudication type symptoms and was referred back to Dr. Gwenlyn Found.  Last seen by Dr. Gwenlyn Found in October 2019 - following PAD & Carotids.   Hospitalizations:  . none   Prior CV studies:   The following studies were reviewed today: . none:  Inerval History   Saoirse D Brodowski notes that she was doing OK until the last months or so - now having multiple issues.  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   Notes getting SOB doing Mowing -- was able to do both front & back yard, but not any more. Occasionally notes CP.  Can also get SOB doing housework. If she does lots of walking or grocery shopping - can get winded - occasionally chest hurting.  Can also get lightheaded -- has to take a rest.   L arm numbness - comes & goes t/o the day (not with activity, but after activity).   + notes SOB & tightness when laying down @ night.  1 episode of PND.   Some ankle swelling / puffiness - not every day.  Goes down in AM.   + L Breast pains - had some last week, occurred while sitting down.  No associated with exertion.   PCP added Zetia & instructed her to adjust her diet (Lipids are much worse than last year) -- rechecking in Dec.   Cardiovascular ROS: positive for -  chest pain, dyspnea on exertion, edema, orthopnea, paroxysmal nocturnal dyspnea and shortness of breath negative for - irregular heartbeat, palpitations, rapid heart rate or TIA/amaurosis fugax  ROS:  Please see the history of present illness.    The patient does not have symptoms concerning for COVID-19 infection (fever, chills, cough, or new shortness of breath).  Review of Systems  Constitutional: Positive for malaise/fatigue (exercise intolerance).  HENT: Negative for nosebleeds.   Respiratory: Positive for shortness of breath. Negative for cough and wheezing.   Cardiovascular: Positive for orthopnea. Negative for palpitations and PND.   Gastrointestinal: Positive for nausea (after taking hydralazine). Negative for blood in stool, heartburn and melena.  Genitourinary: Negative for hematuria.  Musculoskeletal: Positive for back pain. Negative for falls, joint pain and myalgias.  Neurological: Positive for dizziness and tingling (& off & on numbness in L arm (over last few weeks) - happens when sitting down.  Not with activity; comes on after doing activity & sits down). Negative for seizures, weakness and headaches.       Noted in HPI  Psychiatric/Behavioral: Negative for memory loss. The patient is not nervous/anxious and does not have insomnia.   All other systems reviewed and are negative.   The patient is practicing social distancing.  Past Medical History:  Diagnosis Date  . Asthma  10/31/2007  . CAD (coronary artery disease) 08/05/2007   a) NSTEMI 2009: PCI to the RCA Promus DES 2.5 mm x 23 mm (3.0 mm). b) Myoview June 2017: Nonspecific ST changes. EF greater than 65%. LOW RISK, normal study. No ischemia or infarction.  . Carotid artery disease ([place])    a. 50-69% RICA by outpt dopp 04/22/13, f/u due 04/2014.  Marland Kitchen Chronic back pain   . CKD (chronic kidney disease), stage II   . Diverticulosis   04/11/2010    Colonoscopy  . Dyslipidemia, goal LDL below 70   . Essential hypertension    a. Normal renal arteries by PV angio 05/2013.  Marland Kitchen GERD (gastroesophageal reflux disease)   . GI bleed 12/2015  . History of recent blood transfusion 12-19-15, 12-20-15 and 12-21-15  . Insomnia   . Internal hemorrhoids  04/11/2010   By colonoscopy  . Iron deficiency anemia   . Myocardial infarction ([place]) 2009  . Peripheral arterial disease ([place])    a. s/p PTA and stenting of L CIA stenosis 05/2013.  Marland Kitchen Renal insufficiency   . Tobacco abuse    Quit in 05/2013    Past Surgical History:  Procedure Laterality Date  . CHOLECYSTECTOMY  1975  . COLONOSCOPY  04/11/2010  . colonscopy  11/30/2015  . CORONARY ANGIOPLASTY WITH STENT PLACEMENT   08/05/2007   Promus DES 2.5 mm x 23 mm (3 mm)  to RCA; EF 60-70%  . ENTEROSCOPY N/A 12/27/2015   Procedure: ENTEROSCOPY;  Surgeon: Clarene Essex, MD;  Location: [facility] ENDOSCOPY;  Service: Endoscopy;  Laterality: N/A;  also needs slim egd scope  . ESOPHAGOGASTRODUODENOSCOPY N/A 09/13/2013   Procedure: ESOPHAGOGASTRODUODENOSCOPY (EGD);  Surgeon: Lear Ng, MD;  Location: [facility] ENDOSCOPY;  Service: Endoscopy;  Laterality: N/A;  . ESOPHAGOGASTRODUODENOSCOPY N/A 07/14/2015   Procedure: ESOPHAGOGASTRODUODENOSCOPY (EGD);  Surgeon: Clarene Essex, MD;  Location: [facility] ENDOSCOPY;  Service: Endoscopy;  Laterality: N/A;  . gi bleed  07/2015   blood given  . HOT HEMOSTASIS N/A 12/27/2015   Procedure: HOT HEMOSTASIS (ARGON PLASMA COAGULATION/BICAP);  Surgeon: Clarene Essex, MD;  Location: [facility] ENDOSCOPY;  Service: Endoscopy;  Laterality: N/A;  . ILIAC ARTERY STENT Left 05/04/2013  L CIA-EIA -- Dr. Gwenlyn Found  . LOWER EXTREMITY ANGIOGRAM Bilateral 05/04/2013   Procedure: LOWER EXTREMITY ANGIOGRAM;  Surgeon: Lorretta Harp, MD;  Location: [facility] CATH LAB;  Service: Cardiovascular;  Laterality: Bilateral;  . NM MYOVIEW LTD  09/07/2015   EF > 65%. Nonspecific ST changes but no ischemic changes. No ischemia or infarction. LOW RISK  . TONSILLECTOMY  1960's  . TRANSTHORACIC ECHOCARDIOGRAM  09/21/2015   EF 60-65%. GR 1 DD. No regional wall motion analysis. No valve disease.  Marland Kitchen UPPER GI ENDOSCOPY  12/14/2015     Current Meds  Medication Sig  . aspirin EC 81 MG tablet Take 81 mg by mouth daily.  Marland Kitchen atorvastatin (LIPITOR) 80 MG tablet Take 80 mg by mouth daily.   Marland Kitchen ezetimibe (ZETIA) 10 MG tablet Take 10 mg by mouth daily.  . ferrous gluconate (FERGON) 324 MG tablet Take 1 tablet (324 mg total) by mouth 2 (two) times daily with a meal. (Patient taking differently: Take 324 mg by mouth 3 (three) times daily. )  . fluticasone furoate-vilanterol (BREO ELLIPTA) 100-25 MCG/INH AEPB Inhale 1 puff into the lungs daily.  Marland Kitchen gabapentin  (NEURONTIN) 300 MG capsule Take 1 capsule (300 mg total) by mouth 2 (two) times daily.  . hydrALAZINE (APRESOLINE) 50 MG tablet Take 25 mg by mouth 3 (three) times daily. Take 1/2 tablet 3 times a day  . hydrochlorothiazide (HYDRODIURIL) 25 MG tablet Take 25 mg by mouth daily.  . isosorbide mononitrate (IMDUR) 60 MG 24 hr tablet Take 1.5 tablets (90 mg total) by mouth daily.  Marland Kitchen loratadine (CLARITIN) 10 MG tablet Take 10 mg by mouth daily as needed for allergies.  . metoprolol succinate (TOPROL-XL) 100 MG 24 hr tablet Take 2 tablets (200 mg total) by mouth at bedtime. Take with or immediately following a meal.  . nitroGLYCERIN (NITROSTAT) 0.4 MG SL tablet Place 1 tablet (0.4 mg total) under the tongue every 5 (five) minutes as needed for chest pain.  . pantoprazole (PROTONIX) 40 MG tablet Take 1 tablet (40 mg total) by mouth daily.  Marland Kitchen  amLODipine (NORVASC) 5 MG tablet Take 5 mg by mouth daily.   Taking Hydralazine 50 mg tab - 1/2 tab TID -- notes some dizziness & nauseated in AM.   Allergies:   Patient has no known allergies.   Social History   Tobacco Use  . Smoking status: Former Smoker    Types: Cigarettes    Quit date: 05/04/2013    Years since quitting: 5.7  . Smokeless tobacco: Never Used  Substance Use Topics  . Alcohol use: Yes    Comment: occasionally 3 times a year  . Drug use: No     Family Hx: The patient's family history includes Breast cancer in her sister; Breast cancer (age of onset: 69) in her sister; Breast cancer (age of onset: 50) in her mother; Colon cancer (age of onset: 69) in her mother; Diabetes in her mother and sister; Heart attack (age of onset: 52) in her father; Heart attack (age of onset: 37) in her mother; Heart disease in her mother; Pneumonia in her father.   Labs/Other Tests and Data Reviewed:    EKG:  No ECG reviewed.  Recent Labs: No results found for requested labs within last 8760 hours.   Recent Lipid Panel  Oct 2020:  TC 244, HDL 75, LDL  147, TG 113 (? -- does eat lots of cheese etc). Lab Results  Component Value Date/Time   CHOL 106 08/27/2017  09:48 AM   TRIG 74 08/27/2017 09:48 AM   HDL 52 08/27/2017 09:48 AM   CHOLHDL 2.0 08/27/2017 09:48 AM   CHOLHDL 3.2 Ratio 06/13/2010 10:54 PM   LDLCALC 39 08/27/2017 09:48 AM    Wt Readings from Last 3 Encounters:  02/04/19 136 lb (61.7 kg)  12/31/18 137 lb (62.1 kg)  12/31/17 146 lb 3.2 oz (66.3 kg)     Objective:    Vital Signs:  BP (!) 186/85   Pulse 79   Ht 4\' 11"  (1.499 m)   Wt 136 lb (61.7 kg)   BMI 27.47 kg/m   - last PM 153/83 mmHg VITAL SIGNS:  reviewed RESPIRATORY:  normal respiratory effort, symmetric expansion NEURO:  A&O x 3   ASSESSMENT & PLAN:    Problem List Items Addressed This Visit    CAD S/P percutaneous coronary angioplasty -- Promus DES 2.5 mm x 20 mm postdilated to 3 mm (Chronic)    History of RCA stent back in 2009.  No longer on either aspirin or Plavix because if she had some bleeding issues in the past.  My last note as of March 2019 indicated that she was still taking Plavix.  She tells me that she stopped taking both aspirin and Plavix based on my recommendations which seems unusual.  At a minimum I want her to restart the aspirin. Concern for now progressive angina symptoms with worsening exertional dyspnea and chest tightness.  Plan is to proceed with cardiac catheterization and we will address antiplatelet agents at that time.      Relevant Medications   nitroGLYCERIN (NITROSTAT) 0.4 MG SL tablet   amLODipine (NORVASC) 10 MG tablet   furosemide (LASIX) 20 MG tablet   hydrALAZINE (APRESOLINE) 50 MG tablet   Other Relevant Orders   Comprehensive metabolic panel   Coronary artery disease involving native coronary artery of native heart with angina pectoris (HCC) (Chronic)    It took about 10 minutes of talking for her to get into the symptoms of having pretty significant exertional dyspnea and worsening over the last couple months.   She is now also having chest tightness with exertion and then this left "breast pain "that occurs when she has completed exercise or activity.  She also complains of the left arm numbness.  The symptoms are definitely concerning for progressive angina in a patient who is now totally lost control of her lipid management and is no longer on antiplatelet agent.  She is currently taking combination of hydralazine and Estroven mononitrate for blood pressure and afterload reduction (as prescribed by her nephrologist).  She seems to be having some nausea and dizziness associated with her morning dose of hydralazine.  She is on 150 mg of Toprol, along with 5 mg of amlodipine. She claims to be on atorvastatin (currently listed as 80 mg) and has been started on Zetia.  Plan: With symptoms concerning for progressive angina/exertional dyspnea and known history of CAD, we discussed options, and I feel that the symptoms are concerning enough for progressive angina.   Schedule for LEFT HEART CATHETERIZATION WITH CORONARY ANGIOGRAPHY AND POSSIBLE PERCUTANEOUS CORONARY MENTION  Continue Imdur, increase amlodipine to 10 mg daily.  Restart aspirin 81 mg daily -> with first day being 4 tablets for loading.    Restart Plavix 75 mg (first 2 days take 2 tabs daily then 1 tab daily)  Provide prescription for as needed nitroglycerin.      Relevant Medications   nitroGLYCERIN (NITROSTAT) 0.4 MG SL tablet  amLODipine (NORVASC) 10 MG tablet   furosemide (LASIX) 20 MG tablet   hydrALAZINE (APRESOLINE) 50 MG tablet   Other Relevant Orders   CBC   Comprehensive metabolic panel   Essential hypertension, benign (Chronic)   Relevant Medications   nitroGLYCERIN (NITROSTAT) 0.4 MG SL tablet   amLODipine (NORVASC) 10 MG tablet   furosemide (LASIX) 20 MG tablet   hydrALAZINE (APRESOLINE) 50 MG tablet   Dyslipidemia, goal LDL below 70 (Chronic)    Dramatic worsening of lipids from 20 19-20 20. As of May 2019, LDL was  39, was up to 67 by October, now it is up to 147.  This makes me question whether she is truly taking atorvastatin as prescribed.  She says that she is, and her PCP has added Zetia.  Plan is to recheck labs in a few months.  We will recheck fasting the panel as part of her precath labs just to confirm.  Low threshold to consider switching from atorvastatin to rosuvastatin 40 mg and potential referral to CVRR to consider PCSK9 inhibitor if her labs are truly this bad.      Relevant Medications   nitroGLYCERIN (NITROSTAT) 0.4 MG SL tablet   amLODipine (NORVASC) 10 MG tablet   furosemide (LASIX) 20 MG tablet   hydrALAZINE (APRESOLINE) 50 MG tablet   Other Relevant Orders   Lipid panel   Comprehensive metabolic panel   CKD, stage 2 (Chronic)    Listed as having CKD-2.  We will do checking her chemistry panel as part of preop/precath labs.  Most recent creatinine I see from October is 1.07.  This would suggest that CKD 1-2 was more likely.  Precath and post cath hydration.      DOE (dyspnea on exertion)    Most notably worse symptom that she has been complaining of is exertional dyspnea.  May be related to hypertension with diastolic dysfunction, could also be coronary ischemia.  Plan:  2D echo and cardiac catheterization with possible PCI  Improved blood pressure control -> increase amlodipine to 10 mg daily  As needed nitroglycerin  Add Lasix 20 mg daily.      Relevant Medications   nitroGLYCERIN (NITROSTAT) 0.4 MG SL tablet   amLODipine (NORVASC) 10 MG tablet   furosemide (LASIX) 20 MG tablet   Other Relevant Orders   CBC   LEFT HEART CATHETERIZATION WITH CORONARY ANGIOGRAM   Comprehensive metabolic panel   Progressive angina ([place]) - Primary    Very concerning symptoms now for progressively worsening exertional dyspnea and chest tightness.  This could just be related to hypertension and some diastolic heart failure, however with known CAD, and significant worsening lipid  panel with her being off of both aspirin and Plavix, we need to exclude coronary ischemia.   With progressive nature of symptoms, would prefer definitive evaluation with cardiac catheterization.  Plan:   Schedule for LEFT HEART CATHETERIZATION WITH CORONARY ANGIOGRAPHY AND POSSIBLE PERCUTANEOUS CORONARY INTERVENTION  Restart aspirin and Plavix -> with loading doses for both.  Increase amlodipine to 10 mg for additional antianginal effect and blood pressure control  Prescribe as needed nitroglycerin      Relevant Medications   nitroGLYCERIN (NITROSTAT) 0.4 MG SL tablet   amLODipine (NORVASC) 10 MG tablet   furosemide (LASIX) 20 MG tablet   hydrALAZINE (APRESOLINE) 50 MG tablet   Other Relevant Orders   CBC   LEFT HEART CATHETERIZATION WITH CORONARY ANGIOGRAM   Comprehensive metabolic panel    Other Visit Diagnoses  Angina, class III (HCC)       Relevant Medications   nitroGLYCERIN (NITROSTAT) 0.4 MG SL tablet   amLODipine (NORVASC) 10 MG tablet   furosemide (LASIX) 20 MG tablet   hydrALAZINE (APRESOLINE) 50 MG tablet   Other Relevant Orders   Comprehensive metabolic panel      Performing MD:  [name], M.D., M.S.  Procedure: LEFT HEART CATHETERIZATION WITH CORONARY ANGIOGRAPHY and possible PERCUTANEOUS CORONARY INTERVENTION  The procedure with Risks/Benefits/Alternatives and Indications was reviewed with the patient .  All questions were answered.    Risks / Complications include, but not limited to: Death, MI, CVA/TIA, VF/VT (with defibrillation), Bradycardia (need for temporary pacer placement), contrast induced nephropathy, bleeding / bruising / hematoma / pseudoaneurysm, vascular or coronary injury (with possible emergent CT or Vascular Surgery), adverse medication reactions, infection.  Additional risks involving the use of radiation with the possibility of radiation burns and cancer were explained in detail.  The patient voices understanding and agree to  proceed.     COVID-19 Education: The signs and symptoms of COVID-19 were discussed with the patient and how to seek care for testing (follow up with PCP or arrange E-visit).   The importance of social distancing was discussed today.  Time:   Today, I have spent 38 minutes with the patient with telehealth technology discussing the above problems.  Additional 10 minutes and charting.   Medication Adjustments/Labs and Tests Ordered: Current medicines are reviewed at length with the patient today.  Concerns regarding medicines are outlined above.    Patient Instructions   Medication Instructions:   Increase Amlodipine to 10 mg daily -- > change Rx to 10 mg daily (#90, 3 refills)   New:   Furosemide 20 mg PO daily; Disp #90, 3 refills  NTG 0.4 mg - 1 tab SL q 5 min x 3 for prolonged CP (if taking 2nd tab - call 911)  Plavix 75 mg daily -take 2 tablets a day for the first 2 days then 1 tab daily after; Rx 75 mg PO daily, disp 90, 3 refills  Restart ASA 81 mg - 1st day loading dose 4 tab   *If you need a refill on your cardiac medications before your next appointment, please call your pharmacy*  Lab Work:  [place], CBC, LIPID   [place]   If you have labs (blood work) drawn today and your tests are completely normal, you will receive your results only by: Marland Kitchen MyChart Message (if you have MyChart) OR . A paper copy in the mail If you have any lab test that is abnormal or we need to change your treatment, we will call you to review the results.  Testing/Procedures: Will be schedule at [address] 2D Echo- Your physician has requested that you have an echocardiogram. Echocardiography is a painless test that uses sound waves to create images of your heart. It provides your doctor with information about the size and shape of your heart and how well your heart's chambers and valves are working. This procedure  takes approximately one hour. There are no restrictions for this procedure.   Will be scehdule at [place] Left Heart Catheterization with possible Percutaneous Coronary Intervention- Your physician has requested that you have a cardiac catheterization.    Follow-Up: At [facility], you and your health needs are our priority.  As part of our continuing mission to provide you with  exceptional heart care, we have created designated Provider Care Teams.  Your next appointment:   3-4 weeks  The format for your next appointment:   In Person  Provider:   You may see [name], MD or one of the following Advanced Practice Providers on your designated Care Team:    Rosaria Ferries, PA-C  Jory Sims, DNP, ANP  Cadence Kathlen Mody, NP   Other Instructions Cardiac Cath Instructions.   Signed, [name], MD  02/04/2019 12:22 PM    [facility]

## 2019-02-04 NOTE — Assessment & Plan Note (Signed)
History of RCA stent back in 2009.  No longer on either aspirin or Plavix because if she had some bleeding issues in the past.  My last note as of March 2019 indicated that she was still taking Plavix.  She tells me that she stopped taking both aspirin and Plavix based on my recommendations which seems unusual.  At a minimum I want her to restart the aspirin. Concern for now progressive angina symptoms with worsening exertional dyspnea and chest tightness.  Plan is to proceed with cardiac catheterization and we will address antiplatelet agents at that time.

## 2019-02-04 NOTE — Assessment & Plan Note (Signed)
Listed as having CKD-2.  We will do checking her chemistry panel as part of preop/precath labs.  Most recent creatinine I see from October is 1.07.  This would suggest that CKD 1-2 was more likely.  Precath and post cath hydration.

## 2019-02-04 NOTE — Assessment & Plan Note (Signed)
Most notably worse symptom that she has been complaining of is exertional dyspnea.  May be related to hypertension with diastolic dysfunction, could also be coronary ischemia.  Plan:  2D echo and cardiac catheterization with possible PCI  Improved blood pressure control -> increase amlodipine to 10 mg daily  As needed nitroglycerin  Add Lasix 20 mg daily.

## 2019-02-04 NOTE — Assessment & Plan Note (Signed)
Distant history of non-STEMI.  Now having concerning cardiac symptoms.  Has had relatively normal/nonischemic evaluation 3 years ago with normal EF.  Plan for now is 2D echocardiogram and cardiac catheterization.

## 2019-02-04 NOTE — Telephone Encounter (Signed)
SPOKE TO PATIENT. INSTRUCTION WAS GIVEN FROM TODAY'S VIRTUAL VISIT.  AVS SUMMARY WILL GIVEN TO PATIENT TOMORROW WHEN SHE COME TO HAVE LABS DRAWN.   VERBAL INSTRUCTION GIVEN FOR UPCOMING CATH AND COVID TEST .  FOLLOW UP APPOINTMENT SCHEDULE.   PATIENT VERBALIZED UNDERSTANDING.

## 2019-02-04 NOTE — Assessment & Plan Note (Addendum)
It took about 10 minutes of talking for her to get into the symptoms of having pretty significant exertional dyspnea and worsening over the last couple months.  She is now also having chest tightness with exertion and then this left "breast pain "that occurs when she has completed exercise or activity.  She also complains of the left arm numbness.  The symptoms are definitely concerning for progressive angina in a patient who is now totally lost control of her lipid management and is no longer on antiplatelet agent.  She is currently taking combination of hydralazine and Estroven mononitrate for blood pressure and afterload reduction (as prescribed by her nephrologist).  She seems to be having some nausea and dizziness associated with her morning dose of hydralazine.  She is on 150 mg of Toprol, along with 5 mg of amlodipine. She claims to be on atorvastatin (currently listed as 80 mg) and has been started on Zetia.  Plan: With symptoms concerning for progressive angina/exertional dyspnea and known history of CAD, we discussed options, and I feel that the symptoms are concerning enough for progressive angina.   Schedule for LEFT HEART CATHETERIZATION WITH CORONARY ANGIOGRAPHY AND POSSIBLE PERCUTANEOUS CORONARY MENTION  Continue Imdur, increase amlodipine to 10 mg daily.  Restart aspirin 81 mg daily -> with first day being 4 tablets for loading.    Restart Plavix 75 mg (first 2 days take 2 tabs daily then 1 tab daily)  Provide prescription for as needed nitroglycerin.

## 2019-02-04 NOTE — Patient Instructions (Addendum)
Medication Instructions:   Increase Amlodipine to 10 mg daily -- > change Rx to 10 mg daily (#90, 3 refills)   New:   Furosemide 20 mg PO daily; Disp #90, 3 refills  NTG 0.4 mg - 1 tab SL q 5 min x 3 for prolonged CP (if taking 2nd tab - call 911)  Plavix 75 mg daily -take 2 tablets a day for the first 2 days then 1 tab daily after; Rx 75 mg PO daily, disp 90, 3 refills  Restart ASA 81 mg - 1st day loading dose 4 tab   *If you need a refill on your cardiac medications before your next appointment, please call your pharmacy*  Lab Work:  [place], CBC, LIPID   [place]   If you have labs (blood work) drawn today and your tests are completely normal, you will receive your results only by: Marland Kitchen MyChart Message (if you have MyChart) OR . A paper copy in the mail If you have any lab test that is abnormal or we need to change your treatment, we will call you to review the results.  Testing/Procedures: Will be schedule at [address] 2D Echo- Your physician has requested that you have an echocardiogram. Echocardiography is a painless test that uses sound waves to create images of your heart. It provides your doctor with information about the size and shape of your heart and how well your heart's chambers and valves are working. This procedure takes approximately one hour. There are no restrictions for this procedure.   Will be scehdule at [place] Left Heart Catheterization with possible Percutaneous Coronary Intervention- Your physician has requested that you have a cardiac catheterization. Cardiac catheterization is used to diagnose and/or treat various heart conditions. Doctors may recommend this procedure for a number of different reasons. The most common reason is to evaluate chest pain. Chest pain can be a symptom of coronary artery disease (CAD), and cardiac catheterization can  show whether plaque is narrowing or blocking your heart's arteries. This procedure is also used to evaluate the valves, as well as measure the blood flow and oxygen levels in different parts of your heart. For further information please visit [email or web address]. Please follow instruction sheet, as given.   Follow-Up: At [facility], you and your health needs are our priority.  As part of our continuing mission to provide you with exceptional heart care, we have created designated Provider Care Teams.  These Care Teams include your primary Cardiologist (physician) and Advanced Practice Providers (APPs -  Physician Assistants and Nurse Practitioners) who all work together to provide you with the care you need, when you need it.  Your next appointment:   3-4 weeks  The format for your next appointment:   In Person  Provider:   You may see [name], MD or one of the following Advanced Practice Providers on your designated Care Team:    Rosaria Ferries, PA-C  Jory Sims, DNP, ANP  Cadence Kathlen Mody, NP   Other Instructions Cardiac Cath Instructions.        [address] [place] [address] Dept: [phone] Loc: [phone]  Kayla Mcclure  02/04/2019  You are scheduled for a Cardiac Catheterization on Wednesday, November 11 with Dr. [name].  1. Please arrive at the [facility] (Main Entrance A) at [facility]: [address], [place] [address] at 8:00 AM (This time is two hours before your procedure to ensure your preparation). Free valet parking service is available.   Special note: Every effort is made to have your procedure done on time. Please understand that emergencies sometimes delay scheduled procedures.  2. Diet: Do not eat solid foods after midnight.  The patient may have clear liquids until 5am upon the day of the procedure.  3.  Labs: You will need to have blood drawn ( CMP , LIPID , CBC)  on , November 5 or 6 at [place]  Open: 8am - 5pm (Lunch 12:30 - 1:30)   Phone: [phone]. You do not need to be fasting.  February 07, 2019 AT 10 :[place] ( [place] [address] ) FOR COVD TEST THEN SELF ISOLATE UNTIL Wednesday November THE 11TH  4. Medication instructions in preparation for your procedure  Do not take, HTCZ (Hydrochlorothiazide) Wednesday, November 11, On the morning of your procedure, take your Aspirin 81 mg and Plavix 75 mg and any morning medicines NOT listed above.  You may use sips of water.  5. Plan for one night stay--bring personal belongings. 6. Bring a current list of your medications and current insurance cards. 7. You MUST have a responsible person to drive you home. 8. Someone MUST be with you the first 24 hours after you arrive home or your discharge will be delayed. 9. Please wear clothes that are easy to get on and off and wear slip-on shoes.  Thank you for allowing [place] to care for you!   -- [facility]

## 2019-02-04 NOTE — Progress Notes (Signed)
Virtual Visit via Telephone Note   This visit type was conducted due to national recommendations for restrictions regarding the COVID-19 Pandemic (e.g. social distancing) in an effort to limit this patient's exposure and mitigate transmission in our community.  Due to her co-morbid illnesses, this patient is at least at moderate risk for complications without adequate follow up.  This format is felt to be most appropriate for this patient at this time.  The patient did not have access to video technology/had technical difficulties with video requiring transitioning to audio format only (telephone).  All issues noted in this document were discussed and addressed.  No physical exam could be performed with this format.  Please refer to the patient's chart for her  consent to telehealth for [facility].   Patient has given verbal permission to conduct this visit via virtual appointment and to bill insurance 02/04/2019 12:22 PM     Evaluation Performed:  Follow-up visit  Date:  02/04/2019   ID:  Kayla Mcclure, DOB 1945-06-04, MRN [id number]  Patient Location: Home Provider Location: Home  PCP:  Lois Huxley, PA  Cardiologist:   [name], MD  Electrophysiologist:  None   Chief Complaint:   Chief Complaint  Patient presents with  . Follow-up    Multiple complaints  . Shortness of Breath    With less and less amount of exertion  . Chest Pain    Chest tightness with exertion, also left-sided "breast pain "  . Coronary Artery Disease    History of Present Illness:    Kayla Mcclure is a 73 y.o. female with PMH notable for CAD having PCI in the setting of non-STEMI back in 2009 (negative Myoview most recently in June 2017), as well as PAD and carotid disease (followed by Dr. Gwenlyn Found) who presents via audio/video conferencing for a telehealth visit today. -- Annual f/u delayed due to [place].  Kayla Mcclure was last seen by me in February 2019.  Stable from a cardiac standpoint.   Did note some possible claudication type symptoms and was referred back to Dr. Gwenlyn Found.  Last seen by Dr. Gwenlyn Found in October 2019 - following PAD & Carotids.   Hospitalizations:  . none   Prior CV studies:   The following studies were reviewed today: . none:  Inerval History   Kayla Mcclure notes that she was doing OK until the last months or so - now having multiple issues.  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   Notes getting SOB doing Mowing -- was able to do both front & back yard, but not any more. Occasionally notes CP.  Can also get SOB doing housework. If she does lots of walking or grocery shopping - can get winded - occasionally chest hurting.  Can also get lightheaded -- has to take a rest.   L arm numbness - comes & goes t/o the day (not with activity, but after activity).   + notes SOB & tightness when laying down @ night.  1 episode of PND.   Some ankle swelling / puffiness - not every day.  Goes down in AM.   + L Breast pains - had some last week, occurred while sitting down.  No associated with exertion.   PCP added Zetia & instructed her to adjust her diet (Lipids are much worse than last year) -- rechecking in Dec.   Cardiovascular ROS: positive for -  chest pain, dyspnea on exertion, edema, orthopnea, paroxysmal nocturnal dyspnea and shortness of breath negative for - irregular heartbeat, palpitations, rapid heart rate or TIA/amaurosis fugax  ROS:  Please see the history of present illness.    The patient does not have symptoms concerning for COVID-19 infection (fever, chills, cough, or new shortness of breath).  Review of Systems  Constitutional: Positive for malaise/fatigue (exercise intolerance).  HENT: Negative for nosebleeds.   Respiratory: Positive for shortness of breath. Negative for cough and wheezing.   Cardiovascular: Positive for orthopnea. Negative for palpitations and PND.   Gastrointestinal: Positive for nausea (after taking hydralazine). Negative for blood in stool, heartburn and melena.  Genitourinary: Negative for hematuria.  Musculoskeletal: Positive for back pain. Negative for falls, joint pain and myalgias.  Neurological: Positive for dizziness and tingling (& off & on numbness in L arm (over last few weeks) - happens when sitting down.  Not with activity; comes on after doing activity & sits down). Negative for seizures, weakness and headaches.       Noted in HPI  Psychiatric/Behavioral: Negative for memory loss. The patient is not nervous/anxious and does not have insomnia.   All other systems reviewed and are negative.   The patient is practicing social distancing.  Past Medical History:  Diagnosis Date  . Asthma  10/31/2007  . CAD (coronary artery disease) 08/05/2007   a) NSTEMI 2009: PCI to the RCA Promus DES 2.5 mm x 23 mm (3.0 mm). b) Myoview June 2017: Nonspecific ST changes. EF greater than 65%. LOW RISK, normal study. No ischemia or infarction.  . Carotid artery disease ([place])    a. 50-69% RICA by outpt dopp 04/22/13, f/u due 04/2014.  Marland Kitchen Chronic back pain   . CKD (chronic kidney disease), stage II   . Diverticulosis   04/11/2010    Colonoscopy  . Dyslipidemia, goal LDL below 70   . Essential hypertension    a. Normal renal arteries by PV angio 05/2013.  Marland Kitchen GERD (gastroesophageal reflux disease)   . GI bleed 12/2015  . History of recent blood transfusion 12-19-15, 12-20-15 and 12-21-15  . Insomnia   . Internal hemorrhoids  04/11/2010   By colonoscopy  . Iron deficiency anemia   . Myocardial infarction ([place]) 2009  . Peripheral arterial disease ([place])    a. s/p PTA and stenting of L CIA stenosis 05/2013.  Marland Kitchen Renal insufficiency   . Tobacco abuse    Quit in 05/2013    Past Surgical History:  Procedure Laterality Date  . CHOLECYSTECTOMY  1975  . COLONOSCOPY  04/11/2010  . colonscopy  11/30/2015  . CORONARY ANGIOPLASTY WITH STENT PLACEMENT   08/05/2007   Promus DES 2.5 mm x 23 mm (3 mm)  to RCA; EF 60-70%  . ENTEROSCOPY N/A 12/27/2015   Procedure: ENTEROSCOPY;  Surgeon: Clarene Essex, MD;  Location: [facility] ENDOSCOPY;  Service: Endoscopy;  Laterality: N/A;  also needs slim egd scope  . ESOPHAGOGASTRODUODENOSCOPY N/A 09/13/2013   Procedure: ESOPHAGOGASTRODUODENOSCOPY (EGD);  Surgeon: Lear Ng, MD;  Location[facility] ENDOSCOPY;  Service: Endoscopy;  Laterality: N/A;  . ESOPHAGOGASTRODUODENOSCOPY N/A 07/14/2015   Procedure: ESOPHAGOGASTRODUODENOSCOPY (EGD);  Surgeon: Clarene Essex, MD;  Location: [facility] ENDOSCOPY;  Service: Endoscopy;  Laterality: N/A;  . gi bleed  07/2015   blood given  . HOT HEMOSTASIS N/A 12/27/2015   Procedure: HOT HEMOSTASIS (ARGON PLASMA COAGULATION/BICAP);  Surgeon: Clarene Essex, MD;  Location: [facility] ENDOSCOPY;  Service: Endoscopy;  Laterality: N/A;  . ILIAC ARTERY STENT Left 05/04/2013  L CIA-EIA -- Dr. Gwenlyn Found  . LOWER EXTREMITY ANGIOGRAM Bilateral 05/04/2013   Procedure: LOWER EXTREMITY ANGIOGRAM;  Surgeon: Lorretta Harp, MD;  Location: [facility] CATH LAB;  Service: Cardiovascular;  Laterality: Bilateral;  . NM MYOVIEW LTD  09/07/2015   EF > 65%. Nonspecific ST changes but no ischemic changes. No ischemia or infarction. LOW RISK  . TONSILLECTOMY  1960's  . TRANSTHORACIC ECHOCARDIOGRAM  09/21/2015   EF 60-65%. GR 1 DD. No regional wall motion analysis. No valve disease.  Marland Kitchen UPPER GI ENDOSCOPY  12/14/2015     Current Meds  Medication Sig  . aspirin EC 81 MG tablet Take 81 mg by mouth daily.  Marland Kitchen atorvastatin (LIPITOR) 80 MG tablet Take 80 mg by mouth daily.   Marland Kitchen ezetimibe (ZETIA) 10 MG tablet Take 10 mg by mouth daily.  . ferrous gluconate (FERGON) 324 MG tablet Take 1 tablet (324 mg total) by mouth 2 (two) times daily with a meal. (Patient taking differently: Take 324 mg by mouth 3 (three) times daily. )  . fluticasone furoate-vilanterol (BREO ELLIPTA) 100-25 MCG/INH AEPB Inhale 1 puff into the lungs daily.  Marland Kitchen gabapentin  (NEURONTIN) 300 MG capsule Take 1 capsule (300 mg total) by mouth 2 (two) times daily.  . hydrALAZINE (APRESOLINE) 50 MG tablet Take 25 mg by mouth 3 (three) times daily. Take 1/2 tablet 3 times a day  . hydrochlorothiazide (HYDRODIURIL) 25 MG tablet Take 25 mg by mouth daily.  . isosorbide mononitrate (IMDUR) 60 MG 24 hr tablet Take 1.5 tablets (90 mg total) by mouth daily.  Marland Kitchen loratadine (CLARITIN) 10 MG tablet Take 10 mg by mouth daily as needed for allergies.  . metoprolol succinate (TOPROL-XL) 100 MG 24 hr tablet Take 2 tablets (200 mg total) by mouth at bedtime. Take with or immediately following a meal.  . nitroGLYCERIN (NITROSTAT) 0.4 MG SL tablet Place 1 tablet (0.4 mg total) under the tongue every 5 (five) minutes as needed for chest pain.  . pantoprazole (PROTONIX) 40 MG tablet Take 1 tablet (40 mg total) by mouth daily.  Marland Kitchen  amLODipine (NORVASC) 5 MG tablet Take 5 mg by mouth daily.   Taking Hydralazine 50 mg tab - 1/2 tab TID -- notes some dizziness & nauseated in AM.   Allergies:   Patient has no known allergies.   Social History   Tobacco Use  . Smoking status: Former Smoker    Types: Cigarettes    Quit date: 05/04/2013    Years since quitting: 5.7  . Smokeless tobacco: Never Used  Substance Use Topics  . Alcohol use: Yes    Comment: occasionally 3 times a year  . Drug use: No     Family Hx: The patient's family history includes Breast cancer in her sister; Breast cancer (age of onset: 7) in her sister; Breast cancer (age of onset: 64) in her mother; Colon cancer (age of onset: 71) in her mother; Diabetes in her mother and sister; Heart attack (age of onset: 7) in her father; Heart attack (age of onset: 30) in her mother; Heart disease in her mother; Pneumonia in her father.   Labs/Other Tests and Data Reviewed:    EKG:  No ECG reviewed.  Recent Labs: No results found for requested labs within last 8760 hours.   Recent Lipid Panel  Oct 2020:  TC 244, HDL 75, LDL  147, TG 113 (? -- does eat lots of cheese etc). Lab Results  Component Value Date/Time   CHOL 106 08/27/2017  09:48 AM   TRIG 74 08/27/2017 09:48 AM   HDL 52 08/27/2017 09:48 AM   CHOLHDL 2.0 08/27/2017 09:48 AM   CHOLHDL 3.2 Ratio 06/13/2010 10:54 PM   LDLCALC 39 08/27/2017 09:48 AM    Wt Readings from Last 3 Encounters:  02/04/19 136 lb (61.7 kg)  12/31/18 137 lb (62.1 kg)  12/31/17 146 lb 3.2 oz (66.3 kg)     Objective:    Vital Signs:  BP (!) 186/85   Pulse 79   Ht 4\' 11"  (1.499 m)   Wt 136 lb (61.7 kg)   BMI 27.47 kg/m   - last PM 153/83 mmHg VITAL SIGNS:  reviewed RESPIRATORY:  normal respiratory effort, symmetric expansion NEURO:  A&O x 3   ASSESSMENT & PLAN:    Problem List Items Addressed This Visit    CAD S/P percutaneous coronary angioplasty -- Promus DES 2.5 mm x 20 mm postdilated to 3 mm (Chronic)    History of RCA stent back in 2009.  No longer on either aspirin or Plavix because if she had some bleeding issues in the past.  My last note as of March 2019 indicated that she was still taking Plavix.  She tells me that she stopped taking both aspirin and Plavix based on my recommendations which seems unusual.  At a minimum I want her to restart the aspirin. Concern for now progressive angina symptoms with worsening exertional dyspnea and chest tightness.  Plan is to proceed with cardiac catheterization and we will address antiplatelet agents at that time.      Relevant Medications   nitroGLYCERIN (NITROSTAT) 0.4 MG SL tablet   amLODipine (NORVASC) 10 MG tablet   furosemide (LASIX) 20 MG tablet   hydrALAZINE (APRESOLINE) 50 MG tablet   Other Relevant Orders   Comprehensive metabolic panel   Coronary artery disease involving native coronary artery of native heart with angina pectoris (HCC) (Chronic)    It took about 10 minutes of talking for her to get into the symptoms of having pretty significant exertional dyspnea and worsening over the last couple months.   She is now also having chest tightness with exertion and then this left "breast pain "that occurs when she has completed exercise or activity.  She also complains of the left arm numbness.  The symptoms are definitely concerning for progressive angina in a patient who is now totally lost control of her lipid management and is no longer on antiplatelet agent.  She is currently taking combination of hydralazine and [place] mononitrate for blood pressure and afterload reduction (as prescribed by her nephrologist).  She seems to be having some nausea and dizziness associated with her morning dose of hydralazine.  She is on 150 mg of Toprol, along with 5 mg of amlodipine. She claims to be on atorvastatin (currently listed as 80 mg) and has been started on Zetia.  Plan: With symptoms concerning for progressive angina/exertional dyspnea and known history of CAD, we discussed options, and I feel that the symptoms are concerning enough for progressive angina.   Schedule for LEFT HEART CATHETERIZATION WITH CORONARY ANGIOGRAPHY AND POSSIBLE PERCUTANEOUS CORONARY MENTION  Continue Imdur, increase amlodipine to 10 mg daily.  Restart aspirin 81 mg daily -> with first day being 4 tablets for loading.    Restart Plavix 75 mg (first 2 days take 2 tabs daily then 1 tab daily)  Provide prescription for as needed nitroglycerin.      Relevant Medications   nitroGLYCERIN (NITROSTAT) 0.4 MG SL tablet  amLODipine (NORVASC) 10 MG tablet   furosemide (LASIX) 20 MG tablet   hydrALAZINE (APRESOLINE) 50 MG tablet   Other Relevant Orders   CBC   Comprehensive metabolic panel   Essential hypertension, benign (Chronic)   Relevant Medications   nitroGLYCERIN (NITROSTAT) 0.4 MG SL tablet   amLODipine (NORVASC) 10 MG tablet   furosemide (LASIX) 20 MG tablet   hydrALAZINE (APRESOLINE) 50 MG tablet   Dyslipidemia, goal LDL below 70 (Chronic)    Dramatic worsening of lipids from 20 19-20 20. As of May 2019, LDL was  39, was up to 67 by October, now it is up to 147.  This makes me question whether she is truly taking atorvastatin as prescribed.  She says that she is, and her PCP has added Zetia.  Plan is to recheck labs in a few months.  We will recheck fasting the panel as part of her precath labs just to confirm.  Low threshold to consider switching from atorvastatin to rosuvastatin 40 mg and potential referral to CVRR to consider PCSK9 inhibitor if her labs are truly this bad.      Relevant Medications   nitroGLYCERIN (NITROSTAT) 0.4 MG SL tablet   amLODipine (NORVASC) 10 MG tablet   furosemide (LASIX) 20 MG tablet   hydrALAZINE (APRESOLINE) 50 MG tablet   Other Relevant Orders   Lipid panel   Comprehensive metabolic panel   CKD, stage 2 (Chronic)    Listed as having CKD-2.  We will do checking her chemistry panel as part of preop/precath labs.  Most recent creatinine I see from October is 1.07.  This would suggest that CKD 1-2 was more likely.  Precath and post cath hydration.      DOE (dyspnea on exertion)    Most notably worse symptom that she has been complaining of is exertional dyspnea.  May be related to hypertension with diastolic dysfunction, could also be coronary ischemia.  Plan:  2D echo and cardiac catheterization with possible PCI  Improved blood pressure control -> increase amlodipine to 10 mg daily  As needed nitroglycerin  Add Lasix 20 mg daily.      Relevant Medications   nitroGLYCERIN (NITROSTAT) 0.4 MG SL tablet   amLODipine (NORVASC) 10 MG tablet   furosemide (LASIX) 20 MG tablet   Other Relevant Orders   CBC   LEFT HEART CATHETERIZATION WITH CORONARY ANGIOGRAM   Comprehensive metabolic panel   Progressive angina ([place]) - Primary    Very concerning symptoms now for progressively worsening exertional dyspnea and chest tightness.  This could just be related to hypertension and some diastolic heart failure, however with known CAD, and significant worsening lipid  panel with her being off of both aspirin and Plavix, we need to exclude coronary ischemia.   With progressive nature of symptoms, would prefer definitive evaluation with cardiac catheterization.  Plan:   Schedule for LEFT HEART CATHETERIZATION WITH CORONARY ANGIOGRAPHY AND POSSIBLE PERCUTANEOUS CORONARY INTERVENTION  Restart aspirin and Plavix -> with loading doses for both.  Increase amlodipine to 10 mg for additional antianginal effect and blood pressure control  Prescribe as needed nitroglycerin      Relevant Medications   nitroGLYCERIN (NITROSTAT) 0.4 MG SL tablet   amLODipine (NORVASC) 10 MG tablet   furosemide (LASIX) 20 MG tablet   hydrALAZINE (APRESOLINE) 50 MG tablet   Other Relevant Orders   CBC   LEFT HEART CATHETERIZATION WITH CORONARY ANGIOGRAM   Comprehensive metabolic panel    Other Visit Diagnoses  Angina, class III (HCC)       Relevant Medications   nitroGLYCERIN (NITROSTAT) 0.4 MG SL tablet   amLODipine (NORVASC) 10 MG tablet   furosemide (LASIX) 20 MG tablet   hydrALAZINE (APRESOLINE) 50 MG tablet   Other Relevant Orders   Comprehensive metabolic panel      Performing MD:  [name], M.D., M.S.  Procedure: LEFT HEART CATHETERIZATION WITH CORONARY ANGIOGRAPHY and possible PERCUTANEOUS CORONARY INTERVENTION  The procedure with Risks/Benefits/Alternatives and Indications was reviewed with the patient .  All questions were answered.    Risks / Complications include, but not limited to: Death, MI, CVA/TIA, VF/VT (with defibrillation), Bradycardia (need for temporary pacer placement), contrast induced nephropathy, bleeding / bruising / hematoma / pseudoaneurysm, vascular or coronary injury (with possible emergent CT or Vascular Surgery), adverse medication reactions, infection.  Additional risks involving the use of radiation with the possibility of radiation burns and cancer were explained in detail.  The patient voices understanding and agree to  proceed.     COVID-19 Education: The signs and symptoms of COVID-19 were discussed with the patient and how to seek care for testing (follow up with PCP or arrange E-visit).   The importance of social distancing was discussed today.  Time:   Today, I have spent 38 minutes with the patient with telehealth technology discussing the above problems.  Additional 10 minutes and charting.   Medication Adjustments/Labs and Tests Ordered: Current medicines are reviewed at length with the patient today.  Concerns regarding medicines are outlined above.    Patient Instructions   Medication Instructions:   Increase Amlodipine to 10 mg daily -- > change Rx to 10 mg daily (#90, 3 refills)   New:   Furosemide 20 mg PO daily; Disp #90, 3 refills  NTG 0.4 mg - 1 tab SL q 5 min x 3 for prolonged CP (if taking 2nd tab - call 911)  Plavix 75 mg daily -take 2 tablets a day for the first 2 days then 1 tab daily after; Rx 75 mg PO daily, disp 90, 3 refills  Restart ASA 81 mg - 1st day loading dose 4 tab   *If you need a refill on your cardiac medications before your next appointment, please call your pharmacy*  Lab Work:  [place], CBC, LIPID   [place]   If you have labs (blood work) drawn today and your tests are completely normal, you will receive your results only by: Marland Kitchen MyChart Message (if you have MyChart) OR . A paper copy in the mail If you have any lab test that is abnormal or we need to change your treatment, we will call you to review the results.  Testing/Procedures: Will be schedule at [address] 2D Echo- Your physician has requested that you have an echocardiogram. Echocardiography is a painless test that uses sound waves to create images of your heart. It provides your doctor with information about the size and shape of your heart and how well your heart's chambers and valves are working. This procedure  takes approximately one hour. There are no restrictions for this procedure.   Will be scehdule at [place] Left Heart Catheterization with possible Percutaneous Coronary Intervention- Your physician has requested that you have a cardiac catheterization.    Follow-Up: At [facility], you and your health needs are our priority.  As part of our continuing mission to provide you with  exceptional heart care, we have created designated Provider Care Teams.  Your next appointment:   3-4 weeks  The format for your next appointment:   In Person  Provider:   You may see [name], MD or one of the following Advanced Practice Providers on your designated Care Team:    Rosaria Ferries, PA-C  Jory Sims, DNP, ANP  Cadence Kathlen Mody, NP   Other Instructions Cardiac Cath Instructions.   Signed, [name], MD  02/04/2019 12:22 PM    [facility]

## 2019-02-04 NOTE — Assessment & Plan Note (Signed)
Very concerning symptoms now for progressively worsening exertional dyspnea and chest tightness.  This could just be related to hypertension and some diastolic heart failure, however with known CAD, and significant worsening lipid panel with her being off of both aspirin and Plavix, we need to exclude coronary ischemia.   With progressive nature of symptoms, would prefer definitive evaluation with cardiac catheterization.  Plan:   Schedule for LEFT HEART CATHETERIZATION WITH CORONARY ANGIOGRAPHY AND POSSIBLE PERCUTANEOUS CORONARY INTERVENTION  Restart aspirin and Plavix -> with loading doses for both.  Increase amlodipine to 10 mg for additional antianginal effect and blood pressure control  Prescribe as needed nitroglycerin

## 2019-02-04 NOTE — Assessment & Plan Note (Signed)
Actually seems to have lost weight, and is below the "obesity "criteria. This may in part be related to some dietary changes, but continue to encourage exercise once her dyspnea and concerning symptoms for progressive angina are resolved.

## 2019-02-04 NOTE — Assessment & Plan Note (Signed)
Dramatic worsening of lipids from 20 19-20 20. As of May 2019, LDL was 39, was up to 67 by October, now it is up to 147.  This makes me question whether she is truly taking atorvastatin as prescribed.  She says that she is, and her PCP has added Zetia.  Plan is to recheck labs in a few months.  We will recheck fasting the panel as part of her precath labs just to confirm.  Low threshold to consider switching from atorvastatin to rosuvastatin 40 mg and potential referral to CVRR to consider PCSK9 inhibitor if her labs are truly this bad.

## 2019-02-06 LAB — COMPREHENSIVE METABOLIC PANEL
ALT: 24 IU/L (ref 0–32)
AST: 19 IU/L (ref 0–40)
Albumin/Globulin Ratio: 1.3 (ref 1.2–2.2)
Albumin: 4 g/dL (ref 3.7–4.7)
Alkaline Phosphatase: 82 IU/L (ref 39–117)
BUN/Creatinine Ratio: 13 (ref 12–28)
BUN: 12 mg/dL (ref 8–27)
Bilirubin Total: 0.8 mg/dL (ref 0.0–1.2)
CO2: 23 mmol/L (ref 20–29)
Calcium: 9.4 mg/dL (ref 8.7–10.3)
Chloride: 104 mmol/L (ref 96–106)
Creatinine, Ser: 0.96 mg/dL (ref 0.57–1.00)
GFR calc Af Amer: 68 mL/min/{1.73_m2} (ref >59)
GFR calc non Af Amer: 59 mL/min/{1.73_m2} — ABNORMAL LOW (ref >59)
Globulin, Total: 3.1 g/dL (ref 1.5–4.5)
Glucose: 86 mg/dL (ref 65–99)
Potassium: 4.7 mmol/L (ref 3.5–5.2)
Sodium: 140 mmol/L (ref 134–144)
Total Protein: 7.1 g/dL (ref 6.0–8.5)

## 2019-02-06 LAB — LIPID PANEL
Chol/HDL Ratio: 1.9 ratio (ref 0.0–4.4)
Cholesterol, Total: 111 mg/dL (ref 100–199)
HDL: 57 mg/dL (ref >39)
LDL Chol Calc (NIH): 39 mg/dL (ref 0–99)
Triglycerides: 69 mg/dL (ref 0–149)
VLDL Cholesterol Cal: 15 mg/dL (ref 5–40)

## 2019-02-06 LAB — CBC
Hematocrit: 38.5 % (ref 34.0–46.6)
Hemoglobin: 12 g/dL (ref 11.1–15.9)
MCH: 25.3 pg — ABNORMAL LOW (ref 26.6–33.0)
MCHC: 31.2 g/dL — ABNORMAL LOW (ref 31.5–35.7)
MCV: 81 fL (ref 79–97)
Platelets: 212 10*3/uL (ref 150–450)
RBC: 4.74 x10E6/uL (ref 3.77–5.28)
RDW: 17.3 % — ABNORMAL HIGH (ref 11.7–15.4)
WBC: 8.1 10*3/uL (ref 3.4–10.8)

## 2019-02-07 ENCOUNTER — Other Ambulatory Visit (HOSPITAL_COMMUNITY)
Admission: RE | Admit: 2019-02-07 | Discharge: 2019-02-07 | Disposition: A | Discharge status: Home / Self Care | Payer: Medicare Other | Source: Ambulatory Visit | Attending: Cardiology | Admitting: Cardiology

## 2019-02-07 DIAGNOSIS — Z20828 Contact with and (suspected) exposure to other viral communicable diseases: Secondary | ICD-10-CM | POA: Insufficient documentation

## 2019-02-07 DIAGNOSIS — Z01812 Encounter for preprocedural laboratory examination: Secondary | ICD-10-CM | POA: Insufficient documentation

## 2019-02-08 LAB — NOVEL CORONAVIRUS, NAA (HOSP ORDER, SEND-OUT TO REF LAB; TAT 18-24 HRS): SARS-CoV-2, NAA: NOT DETECTED

## 2019-02-09 ENCOUNTER — Other Ambulatory Visit: Payer: Self-pay | Admitting: *Deleted

## 2019-02-09 DIAGNOSIS — Z01818 Encounter for other preprocedural examination: Secondary | ICD-10-CM

## 2019-02-09 DIAGNOSIS — I200 Unstable angina: Secondary | ICD-10-CM

## 2019-02-10 ENCOUNTER — Telehealth: Payer: Self-pay | Admitting: *Deleted

## 2019-02-10 NOTE — Telephone Encounter (Signed)
Pt contacted pre-catheterization scheduled at [facility] for: Wednesday February 11, 2019 10 AM Verified arrival time and place: [place] [facility]) at: 8 AM   No solid food after midnight prior to cath, clear liquids until 5 AM day of procedure. Contrast allergy: no  Hold: Lasix-AM of procedure HCTZ-pt states she does not take.   Except hold medications AM meds can be  taken pre-cath with sip of water including: ASA 81 mg Plavix 75 mg  Confirmed patient has responsible adult to drive home post procedure and observe 24 hours after arriving home: yes  Currently, due to Covid-19 pandemic, only one support person will be allowed with patient. Must be the same support person for that patient's entire stay, will be screened and required to wear a mask. They will be asked to wait in the waiting room for the duration of the patient's stay.  Patients are required to wear a mask when they enter the hospital.      COVID-19 Pre-Screening Questions:  . In the past 7 to 10 days have you had a cough,  shortness of breath, headache, congestion, fever (100 or greater) body aches, chills, sore throat, or sudden loss of taste or sense of smell? no . Have you been around anyone with known Covid 19? no . Have you been around anyone who is awaiting Covid 19 test results in the past 7 to 10 days? no . Have you been around anyone who has been exposed to Covid 19, or has mentioned symptoms of Covid 19 within the past 7 to 10 days? no  I reviewed procedure/mask/visitor instructions, Covid-19 screening questions with patient, she verbalized understanding, thanked me for call.

## 2019-02-11 ENCOUNTER — Encounter (HOSPITAL_COMMUNITY): Payer: Self-pay | Admitting: Cardiology

## 2019-02-11 ENCOUNTER — Encounter (HOSPITAL_COMMUNITY)
Admission: RE | Disposition: A | Discharge status: Home / Self Care | Payer: Self-pay | Source: Home / Self Care | Attending: Cardiology

## 2019-02-11 ENCOUNTER — Other Ambulatory Visit: Payer: Self-pay

## 2019-02-11 ENCOUNTER — Ambulatory Visit (HOSPITAL_COMMUNITY)
Admission: RE | Admit: 2019-02-11 | Discharge: 2019-02-11 | Disposition: A | Discharge status: Home / Self Care | Payer: Medicare Other | Attending: Cardiology | Admitting: Cardiology

## 2019-02-11 DIAGNOSIS — K219 Gastro-esophageal reflux disease without esophagitis: Secondary | ICD-10-CM | POA: Insufficient documentation

## 2019-02-11 DIAGNOSIS — Z7982 Long term (current) use of aspirin: Secondary | ICD-10-CM | POA: Insufficient documentation

## 2019-02-11 DIAGNOSIS — I200 Unstable angina: Secondary | ICD-10-CM | POA: Diagnosis present

## 2019-02-11 DIAGNOSIS — I25119 Atherosclerotic heart disease of native coronary artery with unspecified angina pectoris: Secondary | ICD-10-CM

## 2019-02-11 DIAGNOSIS — Z9861 Coronary angioplasty status: Secondary | ICD-10-CM

## 2019-02-11 DIAGNOSIS — I129 Hypertensive chronic kidney disease with stage 1 through stage 4 chronic kidney disease, or unspecified chronic kidney disease: Secondary | ICD-10-CM | POA: Diagnosis not present

## 2019-02-11 DIAGNOSIS — Z79899 Other long term (current) drug therapy: Secondary | ICD-10-CM | POA: Insufficient documentation

## 2019-02-11 DIAGNOSIS — I739 Peripheral vascular disease, unspecified: Secondary | ICD-10-CM | POA: Insufficient documentation

## 2019-02-11 DIAGNOSIS — Z01818 Encounter for other preprocedural examination: Secondary | ICD-10-CM

## 2019-02-11 DIAGNOSIS — I2510 Atherosclerotic heart disease of native coronary artery without angina pectoris: Secondary | ICD-10-CM

## 2019-02-11 DIAGNOSIS — N182 Chronic kidney disease, stage 2 (mild): Secondary | ICD-10-CM | POA: Insufficient documentation

## 2019-02-11 DIAGNOSIS — Z87891 Personal history of nicotine dependence: Secondary | ICD-10-CM | POA: Insufficient documentation

## 2019-02-11 DIAGNOSIS — E785 Hyperlipidemia, unspecified: Secondary | ICD-10-CM | POA: Diagnosis not present

## 2019-02-11 DIAGNOSIS — I252 Old myocardial infarction: Secondary | ICD-10-CM | POA: Diagnosis not present

## 2019-02-11 HISTORY — PX: LEFT HEART CATH AND CORONARY ANGIOGRAPHY: CATH118249

## 2019-02-11 SURGERY — LEFT HEART CATH AND CORONARY ANGIOGRAPHY
Anesthesia: LOCAL

## 2019-02-11 MED ORDER — HEPARIN SODIUM (PORCINE) 1000 UNIT/ML IJ SOLN
INTRAMUSCULAR | Status: AC
  Filled 2019-02-11: qty 1

## 2019-02-11 MED ORDER — SODIUM CHLORIDE 0.9 % IV SOLN: 250.0000 mL | INTRAVENOUS | Status: DC | PRN

## 2019-02-11 MED ORDER — ONDANSETRON HCL 4 MG/2ML IJ SOLN: 4.0000 mg | Freq: Four times a day (QID) | INTRAMUSCULAR | Status: DC | PRN

## 2019-02-11 MED ORDER — SODIUM CHLORIDE 0.9 % IV SOLN: INTRAVENOUS | Status: DC

## 2019-02-11 MED ORDER — SODIUM CHLORIDE 0.9% FLUSH: 3.0000 mL | Freq: Two times a day (BID) | INTRAVENOUS | Status: DC

## 2019-02-11 MED ORDER — VERAPAMIL HCL 2.5 MG/ML IV SOLN
INTRAVENOUS | Status: AC
  Filled 2019-02-11: qty 2

## 2019-02-11 MED ORDER — HEPARIN (PORCINE) IN NACL 1000-0.9 UT/500ML-% IV SOLN
INTRAVENOUS | Status: AC
  Filled 2019-02-11: qty 1000

## 2019-02-11 MED ORDER — HEPARIN SODIUM (PORCINE) 1000 UNIT/ML IJ SOLN
INTRAMUSCULAR | Status: DC | PRN
  Administered 2019-02-11: 3000 [IU] via INTRAVENOUS

## 2019-02-11 MED ORDER — FENTANYL CITRATE (PF) 100 MCG/2ML IJ SOLN
INTRAMUSCULAR | Status: DC | PRN
  Administered 2019-02-11: 25 ug via INTRAVENOUS

## 2019-02-11 MED ORDER — MIDAZOLAM HCL 2 MG/2ML IJ SOLN
INTRAMUSCULAR | Status: DC | PRN
  Administered 2019-02-11: 1 mg via INTRAVENOUS

## 2019-02-11 MED ORDER — SODIUM CHLORIDE 0.9% FLUSH: 3.0000 mL | INTRAVENOUS | Status: DC | PRN

## 2019-02-11 MED ORDER — VERAPAMIL HCL 2.5 MG/ML IV SOLN
INTRAVENOUS | Status: DC | PRN
  Administered 2019-02-11: 10 mL via INTRA_ARTERIAL

## 2019-02-11 MED ORDER — MIDAZOLAM HCL 2 MG/2ML IJ SOLN
INTRAMUSCULAR | Status: AC
  Filled 2019-02-11: qty 2

## 2019-02-11 MED ORDER — SODIUM CHLORIDE 0.9 % IV SOLN
INTRAVENOUS | Status: DC
  Administered 2019-02-11: 09:00:00 via INTRAVENOUS

## 2019-02-11 MED ORDER — FENTANYL CITRATE (PF) 100 MCG/2ML IJ SOLN
INTRAMUSCULAR | Status: AC
  Filled 2019-02-11: qty 2

## 2019-02-11 MED ORDER — IOHEXOL 350 MG/ML SOLN
INTRAVENOUS | Status: DC | PRN
  Administered 2019-02-11: 60 mL

## 2019-02-11 MED ORDER — ACETAMINOPHEN 325 MG PO TABS: 650.0000 mg | ORAL_TABLET | ORAL | Status: DC | PRN

## 2019-02-11 MED ORDER — HEPARIN (PORCINE) IN NACL 1000-0.9 UT/500ML-% IV SOLN
INTRAVENOUS | Status: DC | PRN
  Administered 2019-02-11 (×2): 500 mL

## 2019-02-11 MED ORDER — LIDOCAINE HCL (PF) 1 % IJ SOLN
INTRAMUSCULAR | Status: AC
  Filled 2019-02-11: qty 30

## 2019-02-11 MED ORDER — LIDOCAINE HCL (PF) 1 % IJ SOLN
INTRAMUSCULAR | Status: DC | PRN
  Administered 2019-02-11: 2 mL

## 2019-02-11 SURGICAL SUPPLY — 10 items
CATH OPTITORQUE TIG 4.0 5F (CATHETERS) ×1 IMPLANT
DEVICE RAD COMP TR BAND LRG (VASCULAR PRODUCTS) ×1 IMPLANT
GLIDESHEATH SLEND SS 6F .021 (SHEATH) ×1 IMPLANT
GUIDEWIRE INQWIRE 1.5J.035X260 (WIRE) IMPLANT
INQWIRE 1.5J .035X260CM (WIRE) ×2
KIT HEART LEFT (KITS) ×2 IMPLANT
PACK CARDIAC CATHETERIZATION (CUSTOM PROCEDURE TRAY) ×2 IMPLANT
SHEATH PROBE COVER 6X72 (BAG) ×1 IMPLANT
TRANSDUCER W/STOPCOCK (MISCELLANEOUS) ×2 IMPLANT
TUBING CIL FLEX 10 FLL-RA (TUBING) ×2 IMPLANT

## 2019-02-11 NOTE — Interval H&P Note (Signed)
History and Physical Interval Note:  02/11/2019 9:24 AM  Kayla Mcclure  has presented today for surgery, with the diagnosis of Angina.  The various methods of treatment have been discussed with the patient and family. After consideration of risks, benefits and other options for treatment, the patient has consented to  Procedure(s): LEFT HEART CATH AND CORONARY ANGIOGRAPHY (N/A)  PERCUTANEOUS CORONARY INTERVENTION   as a surgical intervention.  The patient's history has been reviewed, patient examined, no change in status, stable for surgery.  I have reviewed the patient's chart and labs.  Questions were answered to the patient's satisfaction.    Cath Lab Visit (complete for each Cath Lab visit)  Clinical Evaluation Leading to the Procedure:   ACS: No.  Non-ACS:    Anginal Classification: CCS III  Anti-ischemic medical therapy: Maximal Therapy (2 or more classes of medications)  Non-Invasive Test Results: No non-invasive testing performed  Prior CABG: No previous CABG    Kayla Mcclure

## 2019-02-11 NOTE — Discharge Instructions (Signed)
Radial Site Care ° °This sheet gives you information about how to care for yourself after your procedure. Your health care provider may also give you more specific instructions. If you have problems or questions, contact your health care provider. °What can I expect after the procedure? °After the procedure, it is common to have: °· Bruising and tenderness at the catheter insertion area. °Follow these instructions at home: °Medicines °· Take over-the-counter and prescription medicines only as told by your health care provider. °Insertion site care °· Follow instructions from your health care provider about how to take care of your insertion site. Make sure you: °? Wash your hands with soap and water before you change your bandage (dressing). If soap and water are not available, use hand sanitizer. °? Change your dressing as told by your health care provider. °? Leave stitches (sutures), skin glue, or adhesive strips in place. These skin closures may need to stay in place for 2 weeks or longer. If adhesive strip edges start to loosen and curl up, you may trim the loose edges. Do not remove adhesive strips completely unless your health care provider tells you to do that. °· Check your insertion site every day for signs of infection. Check for: °? Redness, swelling, or pain. °? Fluid or blood. °? Pus or a bad smell. °? Warmth. °· Do not take baths, swim, or use a hot tub until your health care provider approves. °· You may shower 24-48 hours after the procedure, or as directed by your health care provider. °? Remove the dressing and gently wash the site with plain soap and water. °? Pat the area dry with a clean towel. °? Do not rub the site. That could cause bleeding. °· Do not apply powder or lotion to the site. °Activity ° °· For 24 hours after the procedure, or as directed by your health care provider: °? Do not flex or bend the affected arm. °? Do not push or pull heavy objects with the affected arm. °? Do not  drive yourself home from the hospital or clinic. You may drive 24 hours after the procedure unless your health care provider tells you not to. °? Do not operate machinery or power tools. °· Do not lift anything that is heavier than 10 lb (4.5 kg), or the limit that you are told, until your health care provider says that it is safe. °· Ask your health care provider when it is okay to: °? Return to work or school. °? Resume usual physical activities or sports. °? Resume sexual activity. °General instructions °· If the catheter site starts to bleed, raise your arm and put firm pressure on the site. If the bleeding does not stop, get help right away. This is a medical emergency. °· If you went home on the same day as your procedure, a responsible adult should be with you for the first 24 hours after you arrive home. °· Keep all follow-up visits as told by your health care provider. This is important. °Contact a health care provider if: °· You have a fever. °· You have redness, swelling, or yellow drainage around your insertion site. °Get help right away if: °· You have unusual pain at the radial site. °· The catheter insertion area swells very fast. °· The insertion area is bleeding, and the bleeding does not stop when you hold steady pressure on the area. °· Your arm or hand becomes pale, cool, tingly, or numb. °These symptoms may represent a serious problem   that is an emergency. Do not wait to see if the symptoms will go away. Get medical help right away. Call your local emergency services (911 in the [place] Do not drive yourself to the hospital. °Summary °· After the procedure, it is common to have bruising and tenderness at the site. °· Follow instructions from your health care provider about how to take care of your radial site wound. Check the wound every day for signs of infection. °· Do not lift anything that is heavier than 10 lb (4.5 kg), or the limit that you are told, until your health care provider says  that it is safe. °This information is not intended to replace advice given to you by your health care provider. Make sure you discuss any questions you have with your health care provider. °Document Released: 04/21/2010 Document Revised: 04/24/2017 Document Reviewed: 04/24/2017 °Elsevier Patient Education © 2020 [employer] ° °

## 2019-02-12 ENCOUNTER — Telehealth: Payer: Self-pay | Admitting: Cardiology

## 2019-02-12 NOTE — Telephone Encounter (Signed)
Will advise with MD on how long to elevate. Thank you!

## 2019-02-12 NOTE — Telephone Encounter (Signed)
Patient wanted to know how long she needed to keep her wrist elevated. She is currently 24 hrs post cath. She thought she only needed to elevate for 24 hrs, but her son said 25. She wants to confirm so she does not mess up recovery

## 2019-02-12 NOTE — Telephone Encounter (Signed)
24 hr is enough - the 48 hr is limited motion.  Should be on her d/c paperwork.  [name], MD

## 2019-02-13 NOTE — Telephone Encounter (Signed)
Patient is called to check on status of her call from yesterday. She would like a call back.

## 2019-02-13 NOTE — Telephone Encounter (Signed)
Pt updated and voiced understanding.  

## 2019-02-24 ENCOUNTER — Telehealth: Payer: Self-pay | Admitting: *Deleted

## 2019-02-24 ENCOUNTER — Ambulatory Visit: Payer: Medicare Other | Admitting: Cardiology

## 2019-02-24 ENCOUNTER — Other Ambulatory Visit: Payer: Self-pay

## 2019-02-24 ENCOUNTER — Encounter: Payer: Self-pay | Admitting: Cardiology

## 2019-02-24 VITALS — BP 90/54 | HR 74 | Temp 96.8°F | Ht 59.0 in | Wt 136.0 lb

## 2019-02-24 DIAGNOSIS — I10 Essential (primary) hypertension: Secondary | ICD-10-CM | POA: Diagnosis not present

## 2019-02-24 DIAGNOSIS — E785 Hyperlipidemia, unspecified: Secondary | ICD-10-CM | POA: Diagnosis not present

## 2019-02-24 DIAGNOSIS — E669 Obesity, unspecified: Secondary | ICD-10-CM

## 2019-02-24 DIAGNOSIS — I2510 Atherosclerotic heart disease of native coronary artery without angina pectoris: Secondary | ICD-10-CM | POA: Diagnosis not present

## 2019-02-24 DIAGNOSIS — Z9861 Coronary angioplasty status: Secondary | ICD-10-CM

## 2019-02-24 DIAGNOSIS — R0609 Other forms of dyspnea: Secondary | ICD-10-CM

## 2019-02-24 DIAGNOSIS — I25119 Atherosclerotic heart disease of native coronary artery with unspecified angina pectoris: Secondary | ICD-10-CM | POA: Diagnosis not present

## 2019-02-24 DIAGNOSIS — I200 Unstable angina: Secondary | ICD-10-CM

## 2019-02-24 DIAGNOSIS — R0600 Dyspnea, unspecified: Secondary | ICD-10-CM

## 2019-02-24 DIAGNOSIS — R42 Dizziness and giddiness: Secondary | ICD-10-CM

## 2019-02-24 MED ORDER — AMLODIPINE BESYLATE 10 MG PO TABS: ORAL_TABLET | ORAL | 3 refills | Status: DC

## 2019-02-24 MED ORDER — HYDRALAZINE HCL 50 MG PO TABS: ORAL_TABLET | ORAL | 3 refills | Status: DC

## 2019-02-24 NOTE — Patient Instructions (Addendum)
Medication Instructions:   HYDRALAZINE  TWICE A DAY THEN  USE THE THIRD TABLET IF BLOOD PRESSURE  GREATER THAN 160/100  DECREASE AMLODIPINE  1/2 TABLET  OF 10 MG , IF BLOOD PRESSURE IS ABOVE 180 / TAKE THE OTHER 1/2 TABLET.  *If you need a refill on your cardiac medications before your next appointment, please call your pharmacy*  Lab Work: NOT NEEDED   Testing/Procedures: WILL BE SCHEDULE AT [facility] TO  TEST-- Your physician has recommended that you have a pulmonary function test. Pulmonary Function Tests are a group of tests that measure how well air moves in and out of your lungs.   Follow-Up: At [facility], you and your health needs are our priority.  As part of our continuing mission to provide you with exceptional heart care, we have created designated Provider Care Teams.  These Care Teams include your primary Cardiologist (physician) and Advanced Practice Providers (APPs -  Physician Assistants and Nurse Practitioners) who all work together to provide you with the care you need, when you need it.  Your next appointment:   3 month(s)- FEB 2021  The format for your next appointment:   In Person  Provider:   Jory Sims, DNP, ANP  DR [facility] IN 12 MONTHS - NOV 2021   Other Instructions  --> Also change Lasix to as needed only

## 2019-02-24 NOTE — Progress Notes (Signed)
Primary Care Provider: Lois Huxley, PA Cardiologist: [name], MD Electrophysiologist:   Clinic Note: Chief Complaint  Patient presents with  . Hospitalization Follow-up    3 weeks-post-cath.    HPI:    Kayla Mcclure is a 73 y.o. female (former heavy smoker-who recently restarted) with a PMH notable for CAD, PAD, hypertension, hyperlipidemia who presents today for post cath follow-up.   CAD: NSTEMI (2009) and DES PCI to RCA; - June 2017 nonischemic Myoview, normal EF  In November 2020: Patent RCA stent with mild ISR.  RPAV 75%, small caliber vessel, minimal LAD/LCx disease.  PAD: February 2015-lifestyle limiting claudication PTCA left common and external iliac iliac.-LAA Dopplers ABIs.  No claudication  Moderate bilateral internal carotid disease: Carotid Dopplers May 2020 moderate-severe right and moderate left ICA disease.  Seen in September by Dr. Gwenlyn Found ([place]) -she remained stable from a PV standpoint.  Stable Dopplers noted.  Kayla Mcclure was seen by me on February 04, 2019 via telemedicine.  At that time, she was noted to be profoundly hypertensive and was having profound exertional dyspnea also associated with occasional chest pain/pressure.  Symptoms were concerning for unstable angina she was therefore scheduled for cardiac catheterization on November 11. --> Was noted that her PCP had just added Zetia for worsening lipids, and plans to recheck lipids in December.  She was given prescription for furosemide 20 mg daily along with NTG.  Started back on Plavix and aspirin.  Recent Hospitalizations: November 11-cardiac cath  Reviewed  CV studies:    The following studies were reviewed today: (if available, images/films reviewed: From Epic Chart or Care Everywhere) . Cardiac Cath 02/11/2019: Patent RCA stent with minimal ISR after pRCA 50%).  Small caliber RPAV with ~75%.  pLAD 30%, m-dLAD 40%, D2 40%.  Normal LVEDP and LVEF.    Interval History:   Kayla Mcclure turns here today for post cath follow-up stating that her shortness of breath seems to have improved.  In fact it seems now that her blood pressures are somewhat lower than they had been.  She denies any PND orthopnea.  She says that she has been using her Breo inhaler more than then she used to thinks that that may be helping.  She does still get short of breath easier than she has before, no chest pain or pressure.  Oftentimes helped by inhaler.  She has not been able to fully quit smoking.   She denies any irregular heartbeats or palpitations, but notes that she can feel her heart beating forcefully.  Has been noting dizziness -her blood pressures been running lower than usual.  Still using Breo more than usual.  CV Review of Symptoms (Summary)  positive for - dyspnea on exertion, irregular heartbeat, shortness of breath and Some dizziness, mostly orthostatic negative for - chest pain, edema, orthopnea, paroxysmal nocturnal dyspnea, rapid heart rate or -Edema better after starting Lasix.  No syncope/near syncope although she has been dizzy.  No TIA or amaurosis fugax.  No claudication.   The patient does not have symptoms concerning for COVID-19 infection (fever, chills, cough, or new shortness of breath).  The patient is practicing social distancing. ++ Masking.  Not really going out for groceries/shopping.    REVIEWED OF SYSTEMS   A comprehensive ROS was performed. Review of Systems  Constitutional: Negative for malaise/fatigue and weight loss.  HENT: Negative for congestion and nosebleeds.   Respiratory: Positive for cough, shortness of breath and wheezing. Negative for sputum  production.   Cardiovascular: Negative for leg swelling.  Gastrointestinal: Positive for constipation. Negative for blood in stool, heartburn and melena.       She has noted some dark stool since starting aspirin and Plavix, but no true melena.  Genitourinary: Negative for hematuria.  Musculoskeletal:  Negative for joint pain.  Neurological: Positive for dizziness. Negative for focal weakness, weakness and headaches.  Psychiatric/Behavioral: Negative for depression and memory loss. The patient is not nervous/anxious and does not have insomnia.   All other systems reviewed and are negative.  I have reviewed and (if needed) personally updated the patient's problem list, medications, allergies, past medical and surgical history, social and family history.   PAST MEDICAL HISTORY   Past Medical History:  Diagnosis Date  . Asthma  10/31/2007  . CAD (coronary artery disease) 08/05/2007   a) NSTEMI 2009: PCI to the RCA Promus DES 2.5 mm x 23 mm (3.0 mm). b) Myoview June 2017: Nonspecific ST changes. EF greater than 65%. LOW RISK, normal study. No ischemia or infarction.  . Carotid artery disease ([place])    a. 50-69% RICA by outpt dopp 04/22/13, f/u due 04/2014.  Marland Kitchen Chronic back pain   . CKD (chronic kidney disease), stage II   . Diverticulosis   04/11/2010    Colonoscopy  . Dyslipidemia, goal LDL below 70   . Essential hypertension    a. Normal renal arteries by PV angio 05/2013.  Marland Kitchen GERD (gastroesophageal reflux disease)   . GI bleed 12/2015  . History of recent blood transfusion 12/2015   12-19-15, 12-20-15 and 12-21-15  . Insomnia   . Internal hemorrhoids  04/11/2010   By colonoscopy  . Iron deficiency anemia   . Myocardial infarction ([place]) 2009  . Peripheral arterial disease ([place])    a. s/p PTA and stenting of L CIA stenosis 05/2013.  Marland Kitchen Renal insufficiency   . Tobacco abuse    Quit in 05/2013    PAST SURGICAL HISTORY   Past Surgical History:  Procedure Laterality Date  . CHOLECYSTECTOMY  1975  . COLONOSCOPY  04/11/2010  . colonscopy  11/30/2015  . CORONARY ANGIOPLASTY WITH STENT PLACEMENT  08/05/2007   Promus DES 2.5 mm x 23 mm (3 mm)  to RCA; EF 60-70%  . ENTEROSCOPY N/A 12/27/2015   Procedure: ENTEROSCOPY;  Surgeon: Clarene Essex, MD;  Location: [facility] ENDOSCOPY;  Service: Endoscopy;   Laterality: N/A;  also needs slim egd scope  . ESOPHAGOGASTRODUODENOSCOPY N/A 09/13/2013   Procedure: ESOPHAGOGASTRODUODENOSCOPY (EGD);  Surgeon: Lear Ng, MD;  Location: [facility] ENDOSCOPY;  Service: Endoscopy;  Laterality: N/A;  . ESOPHAGOGASTRODUODENOSCOPY N/A 07/14/2015   Procedure: ESOPHAGOGASTRODUODENOSCOPY (EGD);  Surgeon: Clarene Essex, MD;  Location: [facility] ENDOSCOPY;  Service: Endoscopy;  Laterality: N/A;  . gi bleed  07/2015   blood given  . HOT HEMOSTASIS N/A 12/27/2015   Procedure: HOT HEMOSTASIS (ARGON PLASMA COAGULATION/BICAP);  Surgeon: Clarene Essex, MD;  Location: [facility] ENDOSCOPY;  Service: Endoscopy;  Laterality: N/A;  . ILIAC ARTERY STENT Left 05/04/2013   L CIA-EIA -- Dr. Gwenlyn Found  . LEFT HEART CATH AND CORONARY ANGIOGRAPHY N/A 02/11/2019   Procedure: LEFT HEART CATH AND CORONARY ANGIOGRAPHY;  Surgeon: Leonie Man, MD;  Location: [facility] LAB;;-  Patent RCA stent with minimal ISR after pRCA 50%).  Small caliber RPAV with ~75%.  pLAD 30%, m-dLAD 40%, D2 40%.  Normal LVEDP and LVEF.  . LOWER EXTREMITY ANGIOGRAM Bilateral 05/04/2013   Procedure: LOWER EXTREMITY ANGIOGRAM;  Surgeon: Lorretta Harp, MD;  Location: [facility] CATH LAB;  Service: Cardiovascular;  Laterality: Bilateral;  . NM MYOVIEW LTD  09/07/2015   EF > 65%. Nonspecific ST changes but no ischemic changes. No ischemia or infarction. LOW RISK  . TONSILLECTOMY  1960's  . TRANSTHORACIC ECHOCARDIOGRAM  09/21/2015   EF 60-65%. GR 1 DD. No regional wall motion analysis. No valve disease.  Marland Kitchen UPPER GI ENDOSCOPY  12/14/2015     MEDICATIONS/ALLERGIES   Current Meds  Medication Sig  . amLODipine (NORVASC) 10 MG tablet TAKE 5 MG ( 1/2 TABLET) DAILY , IF BLLOD PRESSURE BECOMES  180 OR OVER TAKE  THE OTHER  5 MG ( 1/2 TABLET )  . aspirin EC 81 MG tablet Take 81 mg by mouth daily.  Marland Kitchen atorvastatin (LIPITOR) 80 MG tablet Take 80 mg by mouth daily.   . clopidogrel (PLAVIX) 75 MG tablet Take 1 tablet (75 mg total) by mouth daily.  Marland Kitchen  ezetimibe (ZETIA) 10 MG tablet Take 10 mg by mouth daily.  . ferrous gluconate (FERGON) 324 MG tablet Take 1 tablet (324 mg total) by mouth 2 (two) times daily with a meal.  . fluticasone furoate-vilanterol (BREO ELLIPTA) 100-25 MCG/INH AEPB Inhale 1 puff into the lungs daily.  . furosemide (LASIX) 20 MG tablet Take 1 tablet (20 mg total) by mouth daily.  Marland Kitchen gabapentin (NEURONTIN) 300 MG capsule Take 1 capsule (300 mg total) by mouth 2 (two) times daily. (Patient taking differently: Take 300 mg by mouth 2 (two) times daily as needed (pain.). )  . hydrALAZINE (APRESOLINE) 50 MG tablet MAY TAKE  1 TWICE DAY , IF BLOOD PRESSURE IS ABOVE 160/100 AS NEEDED  . hydrochlorothiazide (HYDRODIURIL) 25 MG tablet Take 25 mg by mouth daily.  . isosorbide mononitrate (IMDUR) 60 MG 24 hr tablet Take 1.5 tablets (90 mg total) by mouth daily.  Marland Kitchen loratadine (CLARITIN) 10 MG tablet Take 10 mg by mouth daily as needed for allergies.  . metoprolol succinate (TOPROL-XL) 100 MG 24 hr tablet Take 2 tablets (200 mg total) by mouth at bedtime. Take with or immediately following a meal.  . nitroGLYCERIN (NITROSTAT) 0.4 MG SL tablet Place 1 tablet (0.4 mg total) under the tongue every 5 (five) minutes as needed for chest pain.  . pantoprazole (PROTONIX) 40 MG tablet Take 1 tablet (40 mg total) by mouth daily. (Patient taking differently: Take 40 mg by mouth daily as needed (acid reflux/indigestion.). )  . rosuvastatin (CRESTOR) 40 MG tablet Take 40 mg by mouth daily.  . [DISCONTINUED] amLODipine (NORVASC) 10 MG tablet Take 1 tablet (10 mg total) by mouth daily.  . [DISCONTINUED] hydrALAZINE (APRESOLINE) 50 MG tablet Take 25 mg by mouth 3 (three) times daily.     No Known Allergies   SOCIAL HISTORY/FAMILY HISTORY   Social History   Tobacco Use  . Smoking status: Former Smoker    Types: Cigarettes    Quit date: 05/04/2013    Years since quitting: 5.8  . Smokeless tobacco: Never Used  Substance Use Topics  . Alcohol  use: Yes    Comment: occasionally 3 times a year  . Drug use: No   Social History   Social History Narrative   Married. Has one child. Grandmother for a great-grandmother and 1.   No real exercise.   Occasional alcohol consumption.   Quit smoking inf Feb 2015 -- after L Iliac Stent   Right-handed   Caffeine: 1 cup every morning    Family History family history includes Breast cancer in  her sister; Breast cancer (age of onset: 93) in her sister; Breast cancer (age of onset: 52) in her mother; Colon cancer (age of onset: 9) in her mother; Diabetes in her mother and sister; Heart attack (age of onset: 39) in her father; Heart attack (age of onset: 61) in her mother; Heart disease in her mother; Pneumonia in her father.   OBJCTIVE -PE, EKG, labs   Wt Readings from Last 3 Encounters:  02/24/19 136 lb (61.7 kg)  02/11/19 136 lb (61.7 kg)  02/04/19 136 lb (61.7 kg)    Physical Exam: BP (!) 90/54 (BP Location: Left Arm, Patient Position: Sitting, Cuff Size: Normal)   Pulse 74   Temp (!) 96.8 F (36 C)   Ht 4\' 11"  (1.499 m)   Wt 136 lb (61.7 kg)   BMI 27.47 kg/m  Physical Exam  Constitutional: She is oriented to person, place, and time. She appears well-developed and well-nourished. No distress.  Well-groomed.  Healthy-appearing.  HENT:  Head: Normocephalic and atraumatic.  Neck: Normal range of motion. Neck supple. No JVD present.  Cardiovascular: Normal rate, regular rhythm, normal heart sounds and intact distal pulses. Exam reveals no gallop and no friction rub.  No murmur heard. Pulmonary/Chest: Effort normal. No respiratory distress. She has wheezes (Mild late expiratory). She has no rales. She exhibits no tenderness.  No rhonchi  Abdominal: Soft. Bowel sounds are normal. She exhibits no distension. There is no abdominal tenderness. There is no rebound.  Musculoskeletal: Normal range of motion.        General: No edema.  Neurological: She is alert and oriented to person,  place, and time.  Psychiatric: She has a normal mood and affect. Her behavior is normal. Judgment and thought content normal.  Vitals reviewed.    Adult ECG Report  Rate: 74 ;  Rhythm: normal sinus rhythm and Inferolateral T wave flattening-nonspecific, but slightly different than before.;   Narrative Interpretation: Relatively stable with exception of flattened inferolateral T waves.  Recent Labs:    Lab Results  Component Value Date   CHOL 111 02/05/2019   HDL 57 02/05/2019   LDLCALC 39 02/05/2019   TRIG 69 02/05/2019   CHOLHDL 1.9 02/05/2019   Lab Results  Component Value Date   CREATININE 0.96 02/05/2019   BUN 12 02/05/2019   NA 140 02/05/2019   K 4.7 02/05/2019   CL 104 02/05/2019   CO2 23 02/05/2019    ASSESSMENT/PLAN    Problem List Items Addressed This Visit    CAD S/P percutaneous coronary angioplasty -- Promus DES 2.5 mm x 20 mm postdilated to 3 mm (Chronic)    Widely patent RCA stent. Had not been on aspirin and Plavix, was restarted prior to catheterization. I suspect if she continues having concerning dark stools that we would probably stop Plavix and continue aspirin alone.   Okay to hold aspirin and Plavix for procedures.      Relevant Medications   hydrALAZINE (APRESOLINE) 50 MG tablet   amLODipine (NORVASC) 10 MG tablet   Other Relevant Orders   EKG 12-Lead (Completed)   Coronary artery disease involving native coronary artery of native heart with angina pectoris ([place]) - Primary (Chronic)    Great news from the recent cath.  Very reassuring.  Plan:She is back on aspirin Plavix now.    If she continues to have some dark stools, we can probably stop one of the 2.  Okay to hold either for procedures.  Will continue with amlodipine, but  reduce to 5 mg due to hypotension, continue current dose Toprol.  Is on Zetia plus rosuvastatin.      Relevant Medications   hydrALAZINE (APRESOLINE) 50 MG tablet   amLODipine (NORVASC) 10 MG tablet   Other  Relevant Orders   EKG 12-Lead (Completed)   Pulmonary Function Test   Essential hypertension, benign (Chronic)    Blood pressure actually is low today.  This is unusual for her.  Somewhat labile. Plan:   Reduce amlodipine to 5 mg daily  Convert hydralazine to 1 tablet twice daily as opposed to 2 tablets twice daily, use a third dose as needed SBP greater than 160/100 mmHg.  Continue Toprol and HCTZ  We will notify her to discontinue Lasix and use as needed only..      Relevant Medications   hydrALAZINE (APRESOLINE) 50 MG tablet   amLODipine (NORVASC) 10 MG tablet   Obesity (BMI 30-39.9) (Chronic)    No longer obese.      Dyslipidemia, goal LDL below 70 (Chronic)    Follow-up labs look much better now.  LDL estimated at 39. She is back taking rosuvastatin and now also taking Zetia.  Would not make any changes until we know for sure.  On next check, would consider checking NMR panel.      Relevant Medications   hydrALAZINE (APRESOLINE) 50 MG tablet   amLODipine (NORVASC) 10 MG tablet   Orthostatic dizziness (Chronic)    Somewhat hypotensive today.  Went back off on amlodipine to 5 mg daily.  Also back off hydralazine as noted.  Likely continue HCTZ and DC Lasix.      DOE (dyspnea on exertion)    Plan was for echo and cath, echo was not done, but EF was normal by LV gram.  EDP was also normal.  I suspect that her dyspnea is probably related to pulmonary disease and not coronary disease/CHF.      Relevant Medications   hydrALAZINE (APRESOLINE) 50 MG tablet   amLODipine (NORVASC) 10 MG tablet   Other Relevant Orders   Pulmonary Function Test   Progressive angina (HCC)    Interestingly, her exertional dyspnea seems to have improved.  She still has some dyspnea, but no more tightness.  Perhaps this could have been related to her accelerated hypertension or worsening lung disease.  Seems to be better with using Breo.  Cardiac cath did not show any evidence of significant  stenoses.  No PCI performed.  LVEDP was relatively normal as well.  Plan:   Continue with Imdur, but reduce to 60 mg.  With borderline blood pressures will reduce amlodipine to 5 mg and monitor.      Relevant Medications   hydrALAZINE (APRESOLINE) 50 MG tablet   amLODipine (NORVASC) 10 MG tablet   Other Relevant Orders   Pulmonary Function Test      COVID-19 Education: The signs and symptoms of COVID-19 were discussed with the patient and how to seek care for testing (follow up with PCP or arrange E-visit).   The importance of social distancing was discussed today.  I spent a total of 18 minutes with the patient and chart review. >  50% of the time was spent in direct patient consultation.  Additional time spent with chart review (studies, outside notes, etc): 8 Total Time: 26 min  Current medicines are reviewed at length with the patient today.  (+/- concerns) n/a   Patient Instructions / Medication Changes & Studies & Tests Ordered   Patient Instructions  Medication Instructions:   HYDRALAZINE  TWICE A DAY THEN  USE THE THIRD TABLET IF BLOOD PRESSURE  GREATER THAN 160/100  DECREASE AMLODIPINE  1/2 TABLET  OF 10 MG , IF BLOOD PRESSURE IS ABOVE 180 / TAKE THE OTHER 1/2 TABLET.  *If you need a refill on your cardiac medications before your next appointment, please call your pharmacy*  Lab Work: NOT NEEDED   Testing/Procedures: WILL BE SCHEDULE AT [facility] TO  TEST-- Your physician has recommended that you have a pulmonary function test. Pulmonary Function Tests are a group of tests that measure how well air moves in and out of your lungs.   Follow-Up: At [facility], you and your health needs are our priority.  As part of our continuing mission to provide you with exceptional heart care, we have created designated Provider Care Teams.  These Care Teams include your primary Cardiologist  (physician) and Advanced Practice Providers (APPs -  Physician Assistants and Nurse Practitioners) who all work together to provide you with the care you need, when you need it.  Your next appointment:   3 month(s)- FEB 2021  The format for your next appointment:   In Person  Provider:   Jory Sims, DNP, ANP  DR [facility] IN 12 MONTHS - NOV 2021   Other Instructions   Patient was called at home and reminded that Lasix was to be made as needed.  If she does use Lasix for either edema or PND/orthopnea, she will take it in lieu of HCTZ.  Studies Ordered:   Orders Placed This Encounter  Procedures  . EKG 12-Lead  . Pulmonary Function Test     [name], M.D., M.S. Interventional Cardiologist   Pager # [phone] Phone # [phone] [address] [place], [place] [address]   Thank you for choosing Heartcare at [facility]!!

## 2019-02-24 NOTE — Telephone Encounter (Signed)
Spoke with patient regarding appointment for PFT's scheduled 03/17/19 at 10:00 am at [facility]--arrival time is 9:30 am at the 1st floor admissions office.  COVD 19 screening is scheduled for Friday 03/13/19 at 1:30 pm .  Patient voiced her understanding--I will also mail information to patient along with instructions for the PFT's.

## 2019-02-25 ENCOUNTER — Encounter: Payer: Self-pay | Admitting: Cardiology

## 2019-02-25 NOTE — Assessment & Plan Note (Signed)
Widely patent RCA stent. Had not been on aspirin and Plavix, was restarted prior to catheterization. I suspect if she continues having concerning dark stools that we would probably stop Plavix and continue aspirin alone.   Okay to hold aspirin and Plavix for procedures.

## 2019-02-25 NOTE — Assessment & Plan Note (Signed)
Somewhat hypotensive today.  Went back off on amlodipine to 5 mg daily.  Also back off hydralazine as noted.  Likely continue HCTZ and DC Lasix.

## 2019-02-25 NOTE — Assessment & Plan Note (Signed)
Follow-up labs look much better now.  LDL estimated at 39. She is back taking rosuvastatin and now also taking Zetia.  Would not make any changes until we know for sure.  On next check, would consider checking NMR panel.

## 2019-02-25 NOTE — Assessment & Plan Note (Signed)
Blood pressure actually is low today.  This is unusual for her.  Somewhat labile. Plan:   Reduce amlodipine to 5 mg daily  Convert hydralazine to 1 tablet twice daily as opposed to 2 tablets twice daily, use a third dose as needed SBP greater than 160/100 mmHg.  Continue Toprol and HCTZ  We will notify her to discontinue Lasix and use as needed only.Marland Kitchen

## 2019-02-25 NOTE — Assessment & Plan Note (Signed)
No longer obese.

## 2019-02-25 NOTE — Assessment & Plan Note (Addendum)
Great news from the recent cath.  Very reassuring.  Plan:She is back on aspirin Plavix now.    If she continues to have some dark stools, we can probably stop one of the 2.  Okay to hold either for procedures.  Will continue with amlodipine, but reduce to 5 mg due to hypotension, continue current dose Toprol.  Is on Zetia plus rosuvastatin.

## 2019-02-25 NOTE — Assessment & Plan Note (Signed)
Interestingly, her exertional dyspnea seems to have improved.  She still has some dyspnea, but no more tightness.  Perhaps this could have been related to her accelerated hypertension or worsening lung disease.  Seems to be better with using Breo.  Cardiac cath did not show any evidence of significant stenoses.  No PCI performed.  LVEDP was relatively normal as well.  Plan:   Continue with Imdur, but reduce to 60 mg.  With borderline blood pressures will reduce amlodipine to 5 mg and monitor.

## 2019-02-25 NOTE — Assessment & Plan Note (Signed)
Plan was for echo and cath, echo was not done, but EF was normal by LV gram.  EDP was also normal.  I suspect that her dyspnea is probably related to pulmonary disease and not coronary disease/CHF.

## 2019-03-06 ENCOUNTER — Other Ambulatory Visit: Payer: Self-pay

## 2019-03-06 ENCOUNTER — Ambulatory Visit
Admission: RE | Admit: 2019-03-06 | Discharge: 2019-03-06 | Disposition: A | Discharge status: Home / Self Care | Payer: Medicare Other | Source: Ambulatory Visit | Attending: Family Medicine | Admitting: Family Medicine

## 2019-03-06 DIAGNOSIS — Z1231 Encounter for screening mammogram for malignant neoplasm of breast: Secondary | ICD-10-CM

## 2019-03-13 ENCOUNTER — Other Ambulatory Visit (HOSPITAL_COMMUNITY): Payer: Medicare Other

## 2019-03-17 ENCOUNTER — Encounter (HOSPITAL_COMMUNITY): Payer: Medicare Other

## 2019-04-09 ENCOUNTER — Other Ambulatory Visit: Payer: Self-pay

## 2019-04-09 ENCOUNTER — Ambulatory Visit
Admission: RE | Admit: 2019-04-09 | Discharge: 2019-04-09 | Disposition: A | Discharge status: Home / Self Care | Payer: Medicare Other | Source: Ambulatory Visit | Attending: Family Medicine | Admitting: Family Medicine

## 2019-04-09 DIAGNOSIS — E2839 Other primary ovarian failure: Secondary | ICD-10-CM

## 2019-05-11 ENCOUNTER — Emergency Department (HOSPITAL_COMMUNITY): Payer: Medicare Other

## 2019-05-11 ENCOUNTER — Encounter (HOSPITAL_COMMUNITY): Payer: Self-pay | Admitting: Emergency Medicine

## 2019-05-11 ENCOUNTER — Inpatient Hospital Stay (HOSPITAL_COMMUNITY)
Admission: EM | Admit: 2019-05-11 | Discharge: 2019-05-16 | DRG: 378 | Disposition: A | Discharge status: Home / Self Care | Payer: Medicare Other | Attending: Internal Medicine | Admitting: Internal Medicine

## 2019-05-11 ENCOUNTER — Other Ambulatory Visit: Payer: Self-pay

## 2019-05-11 DIAGNOSIS — Z955 Presence of coronary angioplasty implant and graft: Secondary | ICD-10-CM

## 2019-05-11 DIAGNOSIS — R471 Dysarthria and anarthria: Secondary | ICD-10-CM | POA: Diagnosis not present

## 2019-05-11 DIAGNOSIS — Z7902 Long term (current) use of antithrombotics/antiplatelets: Secondary | ICD-10-CM

## 2019-05-11 DIAGNOSIS — K449 Diaphragmatic hernia without obstruction or gangrene: Secondary | ICD-10-CM | POA: Diagnosis present

## 2019-05-11 DIAGNOSIS — K922 Gastrointestinal hemorrhage, unspecified: Secondary | ICD-10-CM | POA: Diagnosis not present

## 2019-05-11 DIAGNOSIS — I130 Hypertensive heart and chronic kidney disease with heart failure and stage 1 through stage 4 chronic kidney disease, or unspecified chronic kidney disease: Secondary | ICD-10-CM | POA: Diagnosis present

## 2019-05-11 DIAGNOSIS — Z8249 Family history of ischemic heart disease and other diseases of the circulatory system: Secondary | ICD-10-CM

## 2019-05-11 DIAGNOSIS — N179 Acute kidney failure, unspecified: Secondary | ICD-10-CM | POA: Diagnosis present

## 2019-05-11 DIAGNOSIS — Z87891 Personal history of nicotine dependence: Secondary | ICD-10-CM

## 2019-05-11 DIAGNOSIS — I5032 Chronic diastolic (congestive) heart failure: Secondary | ICD-10-CM | POA: Diagnosis present

## 2019-05-11 DIAGNOSIS — Z20822 Contact with and (suspected) exposure to covid-19: Secondary | ICD-10-CM | POA: Diagnosis present

## 2019-05-11 DIAGNOSIS — I6523 Occlusion and stenosis of bilateral carotid arteries: Secondary | ICD-10-CM | POA: Diagnosis present

## 2019-05-11 DIAGNOSIS — Z7982 Long term (current) use of aspirin: Secondary | ICD-10-CM

## 2019-05-11 DIAGNOSIS — Z6827 Body mass index (BMI) 27.0-27.9, adult: Secondary | ICD-10-CM

## 2019-05-11 DIAGNOSIS — R29703 NIHSS score 3: Secondary | ICD-10-CM | POA: Diagnosis present

## 2019-05-11 DIAGNOSIS — N1831 Chronic kidney disease, stage 3a: Secondary | ICD-10-CM | POA: Diagnosis present

## 2019-05-11 DIAGNOSIS — Z9861 Coronary angioplasty status: Secondary | ICD-10-CM

## 2019-05-11 DIAGNOSIS — R42 Dizziness and giddiness: Secondary | ICD-10-CM | POA: Diagnosis present

## 2019-05-11 DIAGNOSIS — J45909 Unspecified asthma, uncomplicated: Secondary | ICD-10-CM | POA: Diagnosis present

## 2019-05-11 DIAGNOSIS — D320 Benign neoplasm of cerebral meninges: Secondary | ICD-10-CM | POA: Diagnosis present

## 2019-05-11 DIAGNOSIS — R7303 Prediabetes: Secondary | ICD-10-CM | POA: Diagnosis present

## 2019-05-11 DIAGNOSIS — Z9049 Acquired absence of other specified parts of digestive tract: Secondary | ICD-10-CM

## 2019-05-11 DIAGNOSIS — R29810 Facial weakness: Secondary | ICD-10-CM | POA: Diagnosis not present

## 2019-05-11 DIAGNOSIS — I708 Atherosclerosis of other arteries: Secondary | ICD-10-CM | POA: Diagnosis present

## 2019-05-11 DIAGNOSIS — I10 Essential (primary) hypertension: Secondary | ICD-10-CM | POA: Diagnosis present

## 2019-05-11 DIAGNOSIS — J449 Chronic obstructive pulmonary disease, unspecified: Secondary | ICD-10-CM | POA: Diagnosis present

## 2019-05-11 DIAGNOSIS — E785 Hyperlipidemia, unspecified: Secondary | ICD-10-CM | POA: Diagnosis present

## 2019-05-11 DIAGNOSIS — Z800 Family history of malignant neoplasm of digestive organs: Secondary | ICD-10-CM

## 2019-05-11 DIAGNOSIS — R202 Paresthesia of skin: Secondary | ICD-10-CM | POA: Diagnosis not present

## 2019-05-11 DIAGNOSIS — D500 Iron deficiency anemia secondary to blood loss (chronic): Secondary | ICD-10-CM | POA: Diagnosis present

## 2019-05-11 DIAGNOSIS — D649 Anemia, unspecified: Secondary | ICD-10-CM | POA: Diagnosis present

## 2019-05-11 DIAGNOSIS — I2510 Atherosclerotic heart disease of native coronary artery without angina pectoris: Secondary | ICD-10-CM | POA: Diagnosis present

## 2019-05-11 DIAGNOSIS — Z79899 Other long term (current) drug therapy: Secondary | ICD-10-CM

## 2019-05-11 DIAGNOSIS — Z95820 Peripheral vascular angioplasty status with implants and grafts: Secondary | ICD-10-CM

## 2019-05-11 DIAGNOSIS — R634 Abnormal weight loss: Secondary | ICD-10-CM | POA: Diagnosis present

## 2019-05-11 DIAGNOSIS — I252 Old myocardial infarction: Secondary | ICD-10-CM

## 2019-05-11 DIAGNOSIS — Z7951 Long term (current) use of inhaled steroids: Secondary | ICD-10-CM

## 2019-05-11 DIAGNOSIS — K219 Gastro-esophageal reflux disease without esophagitis: Secondary | ICD-10-CM | POA: Diagnosis present

## 2019-05-11 DIAGNOSIS — R55 Syncope and collapse: Secondary | ICD-10-CM

## 2019-05-11 DIAGNOSIS — Z803 Family history of malignant neoplasm of breast: Secondary | ICD-10-CM

## 2019-05-11 DIAGNOSIS — Z8719 Personal history of other diseases of the digestive system: Secondary | ICD-10-CM

## 2019-05-11 DIAGNOSIS — K222 Esophageal obstruction: Secondary | ICD-10-CM | POA: Diagnosis present

## 2019-05-11 DIAGNOSIS — Z833 Family history of diabetes mellitus: Secondary | ICD-10-CM

## 2019-05-11 HISTORY — DX: Chronic diastolic (congestive) heart failure: I50.32

## 2019-05-11 LAB — COMPREHENSIVE METABOLIC PANEL
ALT: 19 U/L (ref 0–44)
AST: 26 U/L (ref 15–41)
Albumin: 3.3 g/dL — ABNORMAL LOW (ref 3.5–5.0)
Alkaline Phosphatase: 67 U/L (ref 38–126)
Anion gap: 11 (ref 5–15)
BUN: 25 mg/dL — ABNORMAL HIGH (ref 8–23)
CO2: 22 mmol/L (ref 22–32)
Calcium: 9 mg/dL (ref 8.9–10.3)
Chloride: 105 mmol/L (ref 98–111)
Creatinine, Ser: 1.32 mg/dL — ABNORMAL HIGH (ref 0.44–1.00)
GFR calc Af Amer: 47 mL/min — ABNORMAL LOW (ref >60)
GFR calc non Af Amer: 40 mL/min — ABNORMAL LOW (ref >60)
Glucose, Bld: 98 mg/dL (ref 70–99)
Potassium: 3.7 mmol/L (ref 3.5–5.1)
Sodium: 138 mmol/L (ref 135–145)
Total Bilirubin: 0.4 mg/dL (ref 0.3–1.2)
Total Protein: 7.4 g/dL (ref 6.5–8.1)

## 2019-05-11 LAB — CBC
HCT: 30 % — ABNORMAL LOW (ref 36.0–46.0)
Hemoglobin: 8.5 g/dL — ABNORMAL LOW (ref 12.0–15.0)
MCH: 21.5 pg — ABNORMAL LOW (ref 26.0–34.0)
MCHC: 28.3 g/dL — ABNORMAL LOW (ref 30.0–36.0)
MCV: 75.8 fL — ABNORMAL LOW (ref 80.0–100.0)
Platelets: 222 10*3/uL (ref 150–400)
RBC: 3.96 MIL/uL (ref 3.87–5.11)
RDW: 18.6 % — ABNORMAL HIGH (ref 11.5–15.5)
WBC: 6.8 10*3/uL (ref 4.0–10.5)
nRBC: 0 % (ref 0.0–0.2)

## 2019-05-11 NOTE — ED Triage Notes (Signed)
Pt reports passing out Tuesday and saw her PCP Friday. PCP told pt her hgb was 9.3 but sent for possible low hgb. Pt endorses some dizziness.

## 2019-05-11 NOTE — ED Notes (Signed)
Pt transported to CT ?

## 2019-05-11 NOTE — ED Provider Notes (Signed)
[facility] EMERGENCY DEPARTMENT Provider Note   CSN: [id number] Arrival date & time: 05/11/19  1643     History Chief Complaint  Patient presents with  . Anemia    Kayla Mcclure is a 74 y.o. female.  The history is provided by the patient and medical records.  Illness Quality:  Lightheadedness Severity:  Moderate Onset quality:  Gradual Duration:  4 months Timing:  Constant Progression:  Worsening Chronicity:  Chronic Context:  H/o GI bleed, on plavix for CAD Relieved by:  Rest Worsened by:  Ambulation Associated symptoms: fatigue and loss of consciousness   Associated symptoms: no abdominal pain, no chest pain, no cough, no diarrhea, no nausea, no shortness of breath and no vomiting   H/o previous GI bleed, has been having lightheadedness for 4 months now.  Reports a syncopal episode 1 week ago with head injury, had severe headache for 4 days after episode, now improved.  Seen by PCP 3 days ago and had worsening anemia, hgb from 12 -> 9, sent here for evaluation.       Past Medical History:  Diagnosis Date  . Asthma  10/31/2007  . CAD (coronary artery disease) 08/05/2007   a) NSTEMI 2009: PCI to the RCA Promus DES 2.5 mm x 23 mm (3.0 mm). b) Myoview June 2017: Nonspecific ST changes. EF greater than 65%. LOW RISK, normal study. No ischemia or infarction.  . Carotid artery disease ([place])    a. 50-69% RICA by outpt dopp 04/22/13, f/u due 04/2014.  Marland Kitchen Chronic back pain   . CKD (chronic kidney disease), stage II   . Diverticulosis   04/11/2010    Colonoscopy  . Dyslipidemia, goal LDL below 70   . Essential hypertension    a. Normal renal arteries by PV angio 05/2013.  Marland Kitchen GERD (gastroesophageal reflux disease)   . GI bleed 12/2015  . History of recent blood transfusion 12/2015   12-19-15, 12-20-15 and 12-21-15  . Insomnia   . Internal hemorrhoids  04/11/2010   By colonoscopy  . Iron deficiency anemia   . Myocardial infarction ([place]) 2009  .  Peripheral arterial disease ([place])    a. s/p PTA and stenting of L CIA stenosis 05/2013.  Marland Kitchen Renal insufficiency   . Tobacco abuse    Quit in 05/2013    Patient Active Problem List   Diagnosis Date Noted  . Acute renal failure superimposed on stage 2 chronic kidney disease ([place]) 05/12/2019  . Syncope 05/12/2019  . Chronic diastolic CHF (congestive heart failure) ([place]) 05/12/2019  . Progressive angina ([place]) 02/04/2019  . Coronary artery disease involving native coronary artery of native heart with angina pectoris ([place]) 02/04/2019  . Acute blood loss anemia   . Symptomatic anemia 12/19/2015  . DOE (dyspnea on exertion) 09/03/2015  . Orthostatic dizziness 07/13/2015  . Bilateral carotid artery disease ([place]) 07/08/2015  . Former heavy cigarette smoker (20-39 per day) 02/04/2014  . GI bleed 09/27/2013  . Anemia 09/11/2013  . Claudication ([place]) 05/04/2013  . Claudication of left lower extremity > right 03/06/2013  . Dyslipidemia, goal LDL below 70 12/23/2012  . PAD (peripheral artery disease) - bilateral CIA & CFA 50-69%; Claudication 12/22/2012  . Obesity (BMI 30-39.9) 12/22/2012  . Internal hemorrhoids 04/11/2010  . Diverticulosis 04/11/2010  . Chronic insomnia 10/07/2009  . Iron deficiency anemia 09/23/2008  . Chronic back pain 09/23/2008  . CKD, stage 2 08/15/2008  . Essential hypertension, benign 10/31/2007  . Asthma 10/31/2007  . CAD S/P  percutaneous coronary angioplasty -- Promus DES 2.5 mm x 20 mm postdilated to 3 mm 08/05/2007  . History of MI (myocardial infarction)  - NSTEMI with PCI of RCA. 08/05/2007    Past Surgical History:  Procedure Laterality Date  . CHOLECYSTECTOMY  1975  . COLONOSCOPY  04/11/2010  . colonscopy  11/30/2015  . CORONARY ANGIOPLASTY WITH STENT PLACEMENT  08/05/2007   Promus DES 2.5 mm x 23 mm (3 mm)  to RCA; EF 60-70%  . ENTEROSCOPY N/A 12/27/2015   Procedure: ENTEROSCOPY;  Surgeon: Clarene Essex, MD;  Location: [facility] ENDOSCOPY;  Service: Endoscopy;   Laterality: N/A;  also needs slim egd scope  . ESOPHAGOGASTRODUODENOSCOPY N/A 09/13/2013   Procedure: ESOPHAGOGASTRODUODENOSCOPY (EGD);  Surgeon: Lear Ng, MD;  Location: [facility] ENDOSCOPY;  Service: Endoscopy;  Laterality: N/A;  . ESOPHAGOGASTRODUODENOSCOPY N/A 07/14/2015   Procedure: ESOPHAGOGASTRODUODENOSCOPY (EGD);  Surgeon: Clarene Essex, MD;  Location: [facility] ENDOSCOPY;  Service: Endoscopy;  Laterality: N/A;  . gi bleed  07/2015   blood given  . HOT HEMOSTASIS N/A 12/27/2015   Procedure: HOT HEMOSTASIS (ARGON PLASMA COAGULATION/BICAP);  Surgeon: Clarene Essex, MD;  Location: [facility] ENDOSCOPY;  Service: Endoscopy;  Laterality: N/A;  . ILIAC ARTERY STENT Left 05/04/2013   L CIA-EIA -- Dr. Gwenlyn Found  . LEFT HEART CATH AND CORONARY ANGIOGRAPHY N/A 02/11/2019   Procedure: LEFT HEART CATH AND CORONARY ANGIOGRAPHY;  Surgeon: Leonie Man, MD;  Location: [facility] CV LAB;;-  Patent RCA stent with minimal ISR after pRCA 50%).  Small caliber RPAV with ~75%.  pLAD 30%, m-dLAD 40%, D2 40%.  Normal LVEDP and LVEF.  . LOWER EXTREMITY ANGIOGRAM Bilateral 05/04/2013   Procedure: LOWER EXTREMITY ANGIOGRAM;  Surgeon: Lorretta Harp, MD;  Location: [facility] CATH LAB;  Service: Cardiovascular;  Laterality: Bilateral;  . NM MYOVIEW LTD  09/07/2015   EF > 65%. Nonspecific ST changes but no ischemic changes. No ischemia or infarction. LOW RISK  . TONSILLECTOMY  1960's  . TRANSTHORACIC ECHOCARDIOGRAM  09/21/2015   EF 60-65%. GR 1 DD. No regional wall motion analysis. No valve disease.  Marland Kitchen UPPER GI ENDOSCOPY  12/14/2015     OB History    Gravida  1   Para  1   Term      Preterm      AB      Living        SAB      TAB      Ectopic      Multiple      Live Births              Family History  Problem Relation Age of Onset  . Diabetes Mother   . Heart disease Mother   . Heart attack Mother 54  . Breast cancer Mother 14  . Colon cancer Mother 96  . Heart attack Father 26  . Pneumonia Father   .  Diabetes Sister   . Breast cancer Sister   . Breast cancer Sister 51    Social History   Tobacco Use  . Smoking status: Former Smoker    Types: Cigarettes    Quit date: 05/04/2013    Years since quitting: 6.0  . Smokeless tobacco: Never Used  Substance Use Topics  . Alcohol use: Yes    Comment: occasionally 3 times a year  . Drug use: No    Home Medications Prior to Admission medications   Medication Sig Start Date End Date Taking? Authorizing Provider  amLODipine (NORVASC) 10 MG  tablet TAKE 5 MG ( 1/2 TABLET) DAILY , IF BLLOD PRESSURE BECOMES  180 OR OVER TAKE  THE OTHER  5 MG ( 1/2 TABLET ) Patient taking differently: Take 5 mg by mouth daily.  02/24/19  Yes Leonie Man, MD  aspirin EC 81 MG tablet Take 81 mg by mouth daily.   Yes [provider]  atorvastatin (LIPITOR) 80 MG tablet Take 80 mg by mouth daily.    Yes [provider]  clopidogrel (PLAVIX) 75 MG tablet Take 1 tablet (75 mg total) by mouth daily. 02/04/19  Yes Leonie Man, MD  ezetimibe (ZETIA) 10 MG tablet Take 10 mg by mouth daily.   Yes [provider]  ferrous gluconate (FERGON) 324 MG tablet Take 1 tablet (324 mg total) by mouth 2 (two) times daily with a meal. 07/15/15  Yes Rai, Ripudeep K, MD  fluticasone furoate-vilanterol (BREO ELLIPTA) 100-25 MCG/INH AEPB Inhale 1 puff into the lungs daily.   Yes [provider]  furosemide (LASIX) 20 MG tablet Take 1 tablet (20 mg total) by mouth daily. 02/04/19 05/10/28 Yes Leonie Man, MD  gabapentin (NEURONTIN) 300 MG capsule Take 1 capsule (300 mg total) by mouth 2 (two) times daily. Patient taking differently: Take 300 mg by mouth 2 (two) times daily as needed (pain.).  06/20/16  Yes Melvenia Beam, MD  hydrALAZINE (APRESOLINE) 50 MG tablet MAY TAKE  1 TWICE DAY , IF BLOOD PRESSURE IS ABOVE 160/100 AS NEEDED Patient taking differently: Take 50 mg by mouth 2 (two) times daily as needed (IF BLOOD PRESSURE IS ABOVE 160/100).   02/24/19  Yes Leonie Man, MD  hydrochlorothiazide (HYDRODIURIL) 25 MG tablet Take 25 mg by mouth daily.   Yes [provider]  isosorbide mononitrate (IMDUR) 60 MG 24 hr tablet Take 1.5 tablets (90 mg total) by mouth daily. 08/08/15  Yes Brett Canales, PA-C  loratadine (CLARITIN) 10 MG tablet Take 10 mg by mouth daily as needed for allergies.   Yes [provider]  metoprolol succinate (TOPROL-XL) 100 MG 24 hr tablet Take 2 tablets (200 mg total) by mouth at bedtime. Take with or immediately following a meal. Patient taking differently: Take 150 mg by mouth at bedtime.  05/14/17  Yes Leonie Man, MD  nitroGLYCERIN (NITROSTAT) 0.4 MG SL tablet Place 1 tablet (0.4 mg total) under the tongue every 5 (five) minutes as needed for chest pain. 02/04/19  Yes Leonie Man, MD  pantoprazole (PROTONIX) 40 MG tablet Take 1 tablet (40 mg total) by mouth daily. Patient taking differently: Take 40 mg by mouth daily as needed (acid reflux/indigestion.).  09/14/13  Yes Ghimire, Henreitta Leber, MD    Allergies    Patient has no known allergies.  Review of Systems   Review of Systems  Constitutional: Positive for fatigue.  Respiratory: Negative for cough and shortness of breath.   Cardiovascular: Negative for chest pain.  Gastrointestinal: Negative for abdominal pain, diarrhea, nausea and vomiting.  Neurological: Positive for loss of consciousness, syncope and light-headedness.  All other systems reviewed and are negative.   Physical Exam Updated Vital Signs BP (!) 132/59   Pulse 80   Temp 98.1 F (36.7 C) (Oral)   Resp 16   Ht 4\' 11"  (1.499 m)   Wt 61.2 kg   SpO2 95%   BMI 27.27 kg/m   Physical Exam Vitals and nursing note reviewed. Exam conducted with a chaperone present Building services engineer).  Constitutional:  Appearance: She is well-developed. She is not toxic-appearing or diaphoretic.  HENT:     Head: Normocephalic and atraumatic.     Mouth/Throat:     Mouth: Mucous  membranes are moist.     Pharynx: Oropharynx is clear.  Eyes:     Conjunctiva/sclera: Conjunctivae normal.  Cardiovascular:     Rate and Rhythm: Normal rate and regular rhythm.     Pulses: Normal pulses.     Heart sounds: No murmur.  Pulmonary:     Effort: Pulmonary effort is normal. No respiratory distress.     Breath sounds: Normal breath sounds.  Abdominal:     Palpations: Abdomen is soft.     Tenderness: There is no abdominal tenderness.  Genitourinary:    Comments: No gross blood or melena on rectal exam Musculoskeletal:     Cervical back: Neck supple.  Skin:    General: Skin is warm and dry.  Neurological:     Mental Status: She is alert.     Sensory: No sensory deficit.     Motor: No weakness.     ED Results / Procedures / Treatments   Labs (all labs ordered are listed, but only abnormal results are displayed) Labs Reviewed  COMPREHENSIVE METABOLIC PANEL - Abnormal; Notable for the following components:      Result Value   BUN 25 (*)    Creatinine, Ser 1.32 (*)    Albumin 3.3 (*)    GFR calc non Af Amer 40 (*)    GFR calc Af Amer 47 (*)    All other components within normal limits  CBC - Abnormal; Notable for the following components:   Hemoglobin 8.5 (*)    HCT 30.0 (*)    MCV 75.8 (*)    MCH 21.5 (*)    MCHC 28.3 (*)    RDW 18.6 (*)    All other components within normal limits  RETICULOCYTES - Abnormal; Notable for the following components:   Immature Retic Fract 17.3 (*)    All other components within normal limits  SARS CORONAVIRUS 2 (TAT 6-24 HRS)  VITAMIN B12  FOLATE  IRON AND TIBC  FERRITIN  POC OCCULT BLOOD, ED  TYPE AND SCREEN    EKG EKG Interpretation  Date/Time:  Monday May 11 2019 17:14:22 EST Ventricular Rate:  80 PR Interval:  136 QRS Duration: 70 QT Interval:  378 QTC Calculation: 435 R Axis:   90 Text Interpretation: Sinus rhythm with Premature atrial complexes No significant change since last tracing Confirmed by  Lajean Saver [id number]) on 05/11/2019 10:32:27 PM   Radiology CT Head Wo Contrast  Result Date: 05/11/2019 CLINICAL DATA:  Syncope on Tuesday, now with dizziness EXAM: CT HEAD WITHOUT CONTRAST TECHNIQUE: Contiguous axial images were obtained from the base of the skull through the vertex without intravenous contrast. COMPARISON:  CT head 09/19/2011 FINDINGS: Brain: 7 x 6 mm partially calcified likely dural based lesion along the right frontal convexity with some subjacent hyperostotic change is most compatible with a meningioma with minimal if any real adjacent mass effect. No adjacent edema. No evidence of acute infarct or hemorrhage. Cavum vergae is noted. Patchy areas of white matter hypoattenuation are most compatible with chronic microvascular angiopathy. Symmetric prominence of the ventricles, cisterns and sulci compatible with parenchymal volume loss. Vascular: Atherosclerotic calcification of the carotid siphons and intradural vertebral arteries. No hyperdense vessel. Skull: No calvarial fracture or suspicious osseous lesion. No scalp swelling or hematoma. Sinuses/Orbits: Paranasal sinuses and mastoid air cells are  predominantly clear. Included orbital structures are unremarkable. Other: None IMPRESSION: 1. No acute intracranial findings. 2. 7 mm partially calcified likely dural based lesion along the right frontal convexity with some subjacent hyperostotic change is most compatible with a meningioma. Appearance is stable from comparison studies. If further characterization is clinically warranted could consider outpatient MR. 3. Symmetric prominence of the ventricles, cisterns and sulci compatible with parenchymal volume loss. Patchy areas of white matter hypoattenuation are most compatible with chronic microvascular angiopathy. Electronically Signed   By: Lovena Le M.D.   On: 05/11/2019 23:23    Procedures Procedures (including critical care time)  Medications Ordered in ED Medications  sodium  chloride 0.9 % bolus 1,000 mL (has no administration in time range)    ED Course  I have reviewed the triage vital signs and the nursing notes.  Pertinent labs & imaging results that were available during my care of the patient were reviewed by me and considered in my medical decision making (see chart for details).    MDM Rules/Calculators/A&P                      Here for worsening lightheadedness.  Syncopal episode a week ago.  No chest pain or SOB.  No gross blood or melena on rectal exam.   Pt has had a 4g hgb drop in the last 3 months and now with symptomatic anemia in the setting of plavix use for CAD.  Will admit to hospital medicine for further evaluation.     Final Clinical Impression(s) / ED Diagnoses Final diagnoses:  Symptomatic anemia  Syncope, unspecified syncope type    Rx / DC Orders ED Discharge Orders    None       Tru Leopard, [place], MD 05/12/19 [id number]    Lajean Saver, MD 05/12/19 2330

## 2019-05-12 ENCOUNTER — Encounter (HOSPITAL_COMMUNITY): Payer: Self-pay | Admitting: Family Medicine

## 2019-05-12 DIAGNOSIS — I2510 Atherosclerotic heart disease of native coronary artery without angina pectoris: Secondary | ICD-10-CM | POA: Diagnosis not present

## 2019-05-12 DIAGNOSIS — N182 Chronic kidney disease, stage 2 (mild): Secondary | ICD-10-CM

## 2019-05-12 DIAGNOSIS — N179 Acute kidney failure, unspecified: Secondary | ICD-10-CM | POA: Diagnosis not present

## 2019-05-12 DIAGNOSIS — D649 Anemia, unspecified: Secondary | ICD-10-CM | POA: Diagnosis not present

## 2019-05-12 DIAGNOSIS — Z9861 Coronary angioplasty status: Secondary | ICD-10-CM

## 2019-05-12 DIAGNOSIS — J45909 Unspecified asthma, uncomplicated: Secondary | ICD-10-CM | POA: Diagnosis not present

## 2019-05-12 DIAGNOSIS — I5032 Chronic diastolic (congestive) heart failure: Secondary | ICD-10-CM

## 2019-05-12 DIAGNOSIS — R55 Syncope and collapse: Secondary | ICD-10-CM | POA: Diagnosis present

## 2019-05-12 LAB — BASIC METABOLIC PANEL
Anion gap: 9 (ref 5–15)
BUN: 19 mg/dL (ref 8–23)
CO2: 21 mmol/L — ABNORMAL LOW (ref 22–32)
Calcium: 8.8 mg/dL — ABNORMAL LOW (ref 8.9–10.3)
Chloride: 110 mmol/L (ref 98–111)
Creatinine, Ser: 1.13 mg/dL — ABNORMAL HIGH (ref 0.44–1.00)
GFR calc Af Amer: 56 mL/min — ABNORMAL LOW (ref >60)
GFR calc non Af Amer: 49 mL/min — ABNORMAL LOW (ref >60)
Glucose, Bld: 81 mg/dL (ref 70–99)
Potassium: 4 mmol/L (ref 3.5–5.1)
Sodium: 140 mmol/L (ref 135–145)

## 2019-05-12 LAB — IRON AND TIBC
Iron: 42 ug/dL (ref 28–170)
Saturation Ratios: 9 % — ABNORMAL LOW (ref 10.4–31.8)
TIBC: 482 ug/dL — ABNORMAL HIGH (ref 250–450)
UIBC: 440 ug/dL

## 2019-05-12 LAB — HEMOGLOBIN
Hemoglobin: 7.9 g/dL — ABNORMAL LOW (ref 12.0–15.0)
Hemoglobin: 7.9 g/dL — ABNORMAL LOW (ref 12.0–15.0)
Hemoglobin: 7.7 g/dL — ABNORMAL LOW (ref 12.0–15.0)

## 2019-05-12 LAB — FOLATE: Folate: 11.9 ng/mL (ref >5.9)

## 2019-05-12 LAB — HEMATOCRIT
HCT: 27.3 % — ABNORMAL LOW (ref 36.0–46.0)
HCT: 26.6 % — ABNORMAL LOW (ref 36.0–46.0)
HCT: 26.3 % — ABNORMAL LOW (ref 36.0–46.0)

## 2019-05-12 LAB — FERRITIN: Ferritin: 5 ng/mL — ABNORMAL LOW (ref 11–307)

## 2019-05-12 LAB — VITAMIN B12: Vitamin B-12: 289 pg/mL (ref 180–914)

## 2019-05-12 LAB — RETICULOCYTES
Immature Retic Fract: 17.3 % — ABNORMAL HIGH (ref 2.3–15.9)
RBC.: 4.37 MIL/uL (ref 3.87–5.11)
Retic Count, Absolute: 37.6 10*3/uL (ref 19.0–186.0)
Retic Ct Pct: 0.9 % (ref 0.4–3.1)

## 2019-05-12 LAB — POC OCCULT BLOOD, ED: Fecal Occult Bld: NEGATIVE

## 2019-05-12 LAB — SARS CORONAVIRUS 2 (TAT 6-24 HRS): SARS Coronavirus 2: NEGATIVE

## 2019-05-12 LAB — OCCULT BLOOD X 1 CARD TO LAB, STOOL: Fecal Occult Bld: NEGATIVE

## 2019-05-12 MED ORDER — SODIUM CHLORIDE 0.9 % IV BOLUS
1000.0000 mL | Freq: Once | INTRAVENOUS | Status: AC
  Administered 2019-05-12: 1000 mL via INTRAVENOUS

## 2019-05-12 MED ORDER — SODIUM CHLORIDE 0.9% FLUSH
3.0000 mL | Freq: Two times a day (BID) | INTRAVENOUS | Status: DC
  Administered 2019-05-12 – 2019-05-16 (×9): 3 mL via INTRAVENOUS

## 2019-05-12 MED ORDER — AMLODIPINE BESYLATE 5 MG PO TABS
5.0000 mg | ORAL_TABLET | Freq: Every day | ORAL | Status: DC
  Administered 2019-05-12 – 2019-05-14 (×3): 5 mg via ORAL
  Filled 2019-05-12 (×3): qty 1

## 2019-05-12 MED ORDER — PANTOPRAZOLE SODIUM 40 MG PO TBEC: 40.0000 mg | DELAYED_RELEASE_TABLET | Freq: Every day | ORAL | Status: DC

## 2019-05-12 MED ORDER — ACETAMINOPHEN 325 MG PO TABS
650.0000 mg | ORAL_TABLET | Freq: Four times a day (QID) | ORAL | Status: DC | PRN
  Administered 2019-05-13: 18:00:00 650 mg via ORAL
  Filled 2019-05-12: qty 2

## 2019-05-12 MED ORDER — ACETAMINOPHEN 650 MG RE SUPP: 650.0000 mg | Freq: Four times a day (QID) | RECTAL | Status: DC | PRN

## 2019-05-12 MED ORDER — ISOSORBIDE MONONITRATE ER 60 MG PO TB24
90.0000 mg | ORAL_TABLET | Freq: Every day | ORAL | Status: DC
  Administered 2019-05-12 – 2019-05-16 (×5): 90 mg via ORAL
  Filled 2019-05-12 (×5): qty 1

## 2019-05-12 MED ORDER — METOPROLOL SUCCINATE ER 50 MG PO TB24
150.0000 mg | ORAL_TABLET | Freq: Every day | ORAL | Status: DC
  Administered 2019-05-12 – 2019-05-13 (×2): 150 mg via ORAL
  Filled 2019-05-12 (×2): qty 1

## 2019-05-12 MED ORDER — PANTOPRAZOLE SODIUM 40 MG IV SOLR
40.0000 mg | Freq: Two times a day (BID) | INTRAVENOUS | Status: DC
  Administered 2019-05-12 – 2019-05-16 (×9): 40 mg via INTRAVENOUS
  Filled 2019-05-12 (×10): qty 40

## 2019-05-12 MED ORDER — ONDANSETRON HCL 4 MG PO TABS: 4.0000 mg | ORAL_TABLET | Freq: Four times a day (QID) | ORAL | Status: DC | PRN

## 2019-05-12 MED ORDER — FLUTICASONE FUROATE-VILANTEROL 100-25 MCG/INH IN AEPB
1.0000 | INHALATION_SPRAY | Freq: Every day | RESPIRATORY_TRACT | Status: DC
  Administered 2019-05-12 – 2019-05-16 (×5): 1 via RESPIRATORY_TRACT
  Filled 2019-05-12 (×2): qty 28

## 2019-05-12 MED ORDER — ONDANSETRON HCL 4 MG/2ML IJ SOLN: 4.0000 mg | Freq: Four times a day (QID) | INTRAMUSCULAR | Status: DC | PRN

## 2019-05-12 MED ORDER — FERROUS GLUCONATE 324 (38 FE) MG PO TABS
324.0000 mg | ORAL_TABLET | Freq: Two times a day (BID) | ORAL | Status: DC
  Administered 2019-05-12 – 2019-05-16 (×9): 324 mg via ORAL
  Filled 2019-05-12 (×9): qty 1

## 2019-05-12 MED ORDER — GABAPENTIN 300 MG PO CAPS
300.0000 mg | ORAL_CAPSULE | Freq: Two times a day (BID) | ORAL | Status: DC
  Administered 2019-05-12 – 2019-05-16 (×9): 300 mg via ORAL
  Filled 2019-05-12: qty 3
  Filled 2019-05-12 (×8): qty 1

## 2019-05-12 MED ORDER — EZETIMIBE 10 MG PO TABS
10.0000 mg | ORAL_TABLET | Freq: Every day | ORAL | Status: DC
  Administered 2019-05-12 – 2019-05-16 (×5): 10 mg via ORAL
  Filled 2019-05-12 (×5): qty 1

## 2019-05-12 MED ORDER — ATORVASTATIN CALCIUM 80 MG PO TABS
80.0000 mg | ORAL_TABLET | Freq: Every day | ORAL | Status: DC
  Administered 2019-05-12 – 2019-05-16 (×4): 80 mg via ORAL
  Filled 2019-05-12 (×5): qty 1

## 2019-05-12 NOTE — Consult Note (Signed)
Referring Provider: Dr. Darliss Cheney Primary Care Physician:  Lois Huxley, PA Primary Gastroenterologist:  Dr. Clarene Essex  Reason for Consultation: anemia  HPI: Kayla Mcclure is a 74 y.o. female with history of small bowel AVMs, CKD, CAD s/p stent, and CHF presenting with symptomatic anemia.    She reports dizziness for the last several weeks with a syncopal episode on 05/05/2019.  This led her to see her PCP on 05/08/2019 where she was diagnosed with anemia.  She denies current GI symptoms, including nausea, vomiting, and abdominal pain.  She denies melena and hematochezia.    She notes that during prior episodes of GI bleeding (AVMs), she never experienced melena or hematochezia.  She denies changes in appetite; however, notes an unintentional weight loss of approximately 10 pounds in the last 1-2 months.    She takes Plavix 75mg  daily and 81mg  ASA daily.  She has had a DES placed in the past for CAD.  On admission, her hemoglobin was 8.5, decreased from 12.0 in Nov2020. Since admission, it has drifted down to 7.9 and is stable there.  Additionally, her ferritin is decreased to 5 ng/mL, saturation decreased to 9%, and TIBC is increased to 482.  Fecal occult blood test has been negative on this admission 2 times.  In 2017, she had a capsule endoscopy which showed jejunal bleeding.  She then had an enteroscopy at that time which revealed three duodenal AVMs which were treated with APC.  Her colonoscopy in 2017 was positive for diverticulosis in the sigmoid and descending colon but was otherwise normal.  The patient's last dose of Plavix was last Friday, 4 days ago.   Past Medical History:  Diagnosis Date  . Asthma  10/31/2007  . CAD (coronary artery disease) 08/05/2007   a) NSTEMI 2009: PCI to the RCA Promus DES 2.5 mm x 23 mm (3.0 mm). b) Myoview June 2017: Nonspecific ST changes. EF greater than 65%. LOW RISK, normal study. No ischemia or infarction.  . Carotid artery disease ([place])     a. 50-69% RICA by outpt dopp 04/22/13, f/u due 04/2014.  Marland Kitchen Chronic back pain   . CKD (chronic kidney disease), stage II   . Diverticulosis   04/11/2010    Colonoscopy  . Dyslipidemia, goal LDL below 70   . Essential hypertension    a. Normal renal arteries by PV angio 05/2013.  Marland Kitchen GERD (gastroesophageal reflux disease)   . GI bleed 12/2015  . History of recent blood transfusion 12/2015   12-19-15, 12-20-15 and 12-21-15  . Insomnia   . Internal hemorrhoids  04/11/2010   By colonoscopy  . Iron deficiency anemia   . Myocardial infarction ([place]) 2009  . Peripheral arterial disease ([place])    a. s/p PTA and stenting of L CIA stenosis 05/2013.  Marland Kitchen Renal insufficiency   . Tobacco abuse    Quit in 05/2013    Past Surgical History:  Procedure Laterality Date  . CHOLECYSTECTOMY  1975  . COLONOSCOPY  04/11/2010  . colonscopy  11/30/2015  . CORONARY ANGIOPLASTY WITH STENT PLACEMENT  08/05/2007   Promus DES 2.5 mm x 23 mm (3 mm)  to RCA; EF 60-70%  . ENTEROSCOPY N/A 12/27/2015   Procedure: ENTEROSCOPY;  Surgeon: Clarene Essex, MD;  Location: [facility] ENDOSCOPY;  Service: Endoscopy;  Laterality: N/A;  also needs slim egd scope  . ESOPHAGOGASTRODUODENOSCOPY N/A 09/13/2013   Procedure: ESOPHAGOGASTRODUODENOSCOPY (EGD);  Surgeon: Lear Ng, MD;  Location: [facility] ENDOSCOPY;  Service: Endoscopy;  Laterality:  N/A;  . ESOPHAGOGASTRODUODENOSCOPY N/A 07/14/2015   Procedure: ESOPHAGOGASTRODUODENOSCOPY (EGD);  Surgeon: Clarene Essex, MD;  Location: [facility] ENDOSCOPY;  Service: Endoscopy;  Laterality: N/A;  . gi bleed  07/2015   blood given  . HOT HEMOSTASIS N/A 12/27/2015   Procedure: HOT HEMOSTASIS (ARGON PLASMA COAGULATION/BICAP);  Surgeon: Clarene Essex, MD;  Location: [facility] END[facility];  Service: Endoscopy;  Laterality: N/A;  . ILIAC ARTERY STENT Left 05/04/2013   L CIA-EIA -- Dr. Gwenlyn Found  . LEFT HEART CATH AND CORONARY ANGIOGRAPHY N/A 02/11/2019   Procedure: LEFT HEART CATH AND CORONARY ANGIOGRAPHY;  Surgeon: Leonie Man,  MD;  Location: [place];;-  Patent RCA stent with minimal ISR after pRCA 50%).  Small caliber RPAV with ~75%.  pLAD 30%, m-dLAD 40%, D2 40%.  Normal LVEDP and LVEF.  . LOWER EXTREMITY ANGIOGRAM Bilateral 05/04/2013   Procedure: LOWER EXTREMITY ANGIOGRAM;  Surgeon: Lorretta Harp, MD;  Location: [facility] CATH LAB;  Service: Cardiovascular;  Laterality: Bilateral;  . NM MYOVIEW LTD  09/07/2015   EF > 65%. Nonspecific ST changes but no ischemic changes. No ischemia or infarction. LOW RISK  . TONSILLECTOMY  1960's  . TRANSTHORACIC ECHOCARDIOGRAM  09/21/2015   EF 60-65%. GR 1 DD. No regional wall motion analysis. No valve disease.  Marland Kitchen UPPER GI ENDOSCOPY  12/14/2015    Prior to Admission medications   Medication Sig Start Date End Date Taking? Authorizing Provider  amLODipine (NORVASC) 10 MG tablet TAKE 5 MG ( 1/2 TABLET) DAILY , IF BLLOD PRESSURE BECOMES  180 OR OVER TAKE  THE OTHER  5 MG ( 1/2 TABLET ) Patient taking differently: Take 5 mg by mouth daily.  02/24/19  Yes Leonie Man, MD  aspirin EC 81 MG tablet Take 81 mg by mouth daily.   Yes [provider]  atorvastatin (LIPITOR) 80 MG tablet Take 80 mg by mouth daily.    Yes [provider]  clopidogrel (PLAVIX) 75 MG tablet Take 1 tablet (75 mg total) by mouth daily. 02/04/19  Yes Leonie Man, MD  ezetimibe (ZETIA) 10 MG tablet Take 10 mg by mouth daily.   Yes [provider]  ferrous gluconate (FERGON) 324 MG tablet Take 1 tablet (324 mg total) by mouth 2 (two) times daily with a meal. 07/15/15  Yes Rai, Ripudeep K, MD  fluticasone furoate-vilanterol (BREO ELLIPTA) 100-25 MCG/INH AEPB Inhale 1 puff into the lungs daily.   Yes [provider]  furosemide (LASIX) 20 MG tablet Take 1 tablet (20 mg total) by mouth daily. 02/04/19 05/10/28 Yes Leonie Man, MD  gabapentin (NEURONTIN) 300 MG capsule Take 1 capsule (300 mg total) by mouth 2 (two) times daily. Patient taking differently: Take 300 mg by  mouth 2 (two) times daily as needed (pain.).  06/20/16  Yes Melvenia Beam, MD  hydrALAZINE (APRESOLINE) 50 MG tablet MAY TAKE  1 TWICE DAY , IF BLOOD PRESSURE IS ABOVE 160/100 AS NEEDED Patient taking differently: Take 50 mg by mouth 2 (two) times daily as needed (IF BLOOD PRESSURE IS ABOVE 160/100).  02/24/19  Yes Leonie Man, MD  hydrochlorothiazide (HYDRODIURIL) 25 MG tablet Take 25 mg by mouth daily.   Yes [provider]  isosorbide mononitrate (IMDUR) 60 MG 24 hr tablet Take 1.5 tablets (90 mg total) by mouth daily. 08/08/15  Yes Brett Canales, PA-C  loratadine (CLARITIN) 10 MG tablet Take 10 mg by mouth daily as needed for allergies.   Yes [provider]  metoprolol succinate (TOPROL-XL) 100 MG 24 hr tablet Take 2 tablets (200 mg total) by mouth at bedtime. Take with or immediately following a meal. Patient taking differently: Take 150 mg by mouth at bedtime.  05/14/17  Yes Leonie Man, MD  nitroGLYCERIN (NITROSTAT) 0.4 MG SL tablet Place 1 tablet (0.4 mg total) under the tongue every 5 (five) minutes as needed for chest pain. 02/04/19  Yes Leonie Man, MD  pantoprazole (PROTONIX) 40 MG tablet Take 1 tablet (40 mg total) by mouth daily. Patient taking differently: Take 40 mg by mouth daily as needed (acid reflux/indigestion.).  09/14/13  Yes Ghimire, Henreitta Leber, MD    Current Facility-Administered Medications  Medication Dose Route Frequency Provider Last Rate Last Admin  . acetaminophen (TYLENOL) tablet 650 mg  650 mg Oral Q6H PRN Opyd, Ilene Qua, MD       Or  . acetaminophen (TYLENOL) suppository 650 mg  650 mg Rectal Q6H PRN Opyd, Ilene Qua, MD      . amLODipine (NORVASC) tablet 5 mg  5 mg Oral Daily Darliss Cheney, MD   5 mg at 05/12/19 1205  . atorvastatin (LIPITOR) tablet 80 mg  80 mg Oral q1800 Opyd, Ilene Qua, MD      . ezetimibe (ZETIA) tablet 10 mg  10 mg Oral Daily Darliss Cheney, MD   10 mg at 05/12/19 1205  . ferrous gluconate (FERGON) tablet 324  mg  324 mg Oral BID WC Pahwani, Ravi, MD      . fluticasone furoate-vilanterol (BREO ELLIPTA) 100-25 MCG/INH 1 puff  1 puff Inhalation Daily Opyd, Ilene Qua, MD   1 puff at 05/12/19 0909  . gabapentin (NEURONTIN) capsule 300 mg  300 mg Oral BID Darliss Cheney, MD   300 mg at 05/12/19 1205  . isosorbide mononitrate (IMDUR) 24 hr tablet 90 mg  90 mg Oral Daily Opyd, Ilene Qua, MD   90 mg at 05/12/19 0939  . metoprolol succinate (TOPROL-XL) 24 hr tablet 150 mg  150 mg Oral QHS Pahwani, Ravi, MD      . ondansetron (ZOFRAN) tablet 4 mg  4 mg Oral Q6H PRN Opyd, Ilene Qua, MD       Or  . ondansetron (ZOFRAN) injection 4 mg  4 mg Intravenous Q6H PRN Opyd, Ilene Qua, MD      . pantoprazole (PROTONIX) injection 40 mg  40 mg Intravenous Q12H Darliss Cheney, MD   40 mg at 05/12/19 0939  . sodium chloride flush (NS) 0.9 % injection 3 mL  3 mL Intravenous Q12H Opyd, Ilene Qua, MD   3 mL at 05/12/19 0939    Allergies as of 05/11/2019  . (No Known Allergies)    Family History  Problem Relation Age of Onset  . Diabetes Mother   . Heart disease Mother   . Heart attack Mother 49  . Breast cancer Mother 85  . Colon cancer Mother 12  . Heart attack Father 53  . Pneumonia Father   . Diabetes Sister   . Breast cancer Sister   . Breast cancer Sister 61    Social History   Socioeconomic History  . Marital status: Married    Spouse name: Not on file  . Number of children: 1  . Years of education: Not on file  . Highest education level: Not on file  Occupational History    Employer: RETIRED  Tobacco Use  . Smoking status: Former Smoker    Types: Cigarettes    Quit date: 05/04/2013  Years since quitting: 6.0  . Smokeless tobacco: Never Used  Substance and Sexual Activity  . Alcohol use: Yes    Comment: occasionally 3 times a year  . Drug use: No  . Sexual activity: Never    Partners: Male    Birth control/protection: None  Other Topics Concern  . Not on file  Social History Narrative    Married. Has one child. Grandmother for a great-grandmother and 1.   No real exercise.   Occasional alcohol consumption.   Quit smoking inf Feb 2015 -- after L Iliac Stent   Right-handed   Caffeine: 1 cup every morning   Social Determinants of Health   Financial Resource Strain:   . Difficulty of Paying Living Expenses: Not on file  Food Insecurity:   . Worried About Charity fundraiser in the Last Year: Not on file  . Ran Out of Food in the Last Year: Not on file  Transportation Needs:   . Lack of Transportation (Medical): Not on file  . Lack of Transportation (Non-Medical): Not on file  Physical Activity:   . Days of Exercise per Week: Not on file  . Minutes of Exercise per Session: Not on file  Stress:   . Feeling of Stress : Not on file  Social Connections:   . Frequency of Communication with Friends and Family: Not on file  . Frequency of Social Gatherings with Friends and Family: Not on file  . Attends Religious Services: Not on file  . Active Member of Clubs or Organizations: Not on file  . Attends Archivist Meetings: Not on file  . Marital Status: Not on file  Intimate Partner Violence:   . Fear of Current or Ex-Partner: Not on file  . Emotionally Abused: Not on file  . Physically Abused: Not on file  . Sexually Abused: Not on file    Review of Systems: See HPI  Physical Exam: Vital signs in last 24 hours: Temp:  [98 F (36.7 C)-98.8 F (37.1 C)] 98 F (36.7 C) (02/09 1153) Pulse Rate:  [68-85] 68 (02/09 1153) Resp:  [13-23] 18 (02/09 1153) BP: (101-162)/(54-68) 101/54 (02/09 1153) SpO2:  [92 %-100 %] 97 % (02/09 1153) Weight:  [60.6 kg-61.2 kg] 60.6 kg (02/09 0109) Last BM Date: 05/11/19 General: Alert, pleasant and cooperative in NAD Head: Normocephalic and atraumatic. Lungs: Clear throughout to auscultation. No evident respiratory distress. Heart:  Regular rate and rhythm; no murmurs, clicks, rubs,  or gallops. Abdomen: Soft and nondistended  with no significant tenderness. Normal bowel sounds.  No guarding or rebound.   Extremities:  No edema. Neurologic: Alert and coherent; very sharp mentally; grossly normal neurologically. Skin: Intact without significant lesions or rashes. Warm and well-perfused. Psych:  Alert and cooperative. Normal mood and affect.  Intake/Output from previous day: No intake/output data recorded. Intake/Output this shift: No intake/output data recorded.  Lab Results: Recent Labs    05/11/19 1721 05/12/19 0613 05/12/19 1142  WBC 6.8  --   --   HGB 8.5* 7.9* 7.9*  HCT 30.0* 27.3* 26.6*  PLT 222  --   --    BMET Recent Labs    05/11/19 1721 05/12/19 0613  NA 138 140  K 3.7 4.0  CL 105 110  CO2 22 21*  GLUCOSE 98 81  BUN 25* 19  CREATININE 1.32* 1.13*  CALCIUM 9.0 8.8*   LFT Recent Labs    05/11/19 1721  PROT 7.4  ALBUMIN 3.3*  AST 26  ALT 19  ALKPHOS 67  BILITOT 0.4   PT/INR No results for input(s): LABPROT, INR in the last 72 hours.  Studies/Results: CT Head Wo Contrast  Result Date: 05/11/2019 CLINICAL DATA:  Syncope on Tuesday, now with dizziness EXAM: CT HEAD WITHOUT CONTRAST TECHNIQUE: Contiguous axial images were obtained from the base of the skull through the vertex without intravenous contrast. COMPARISON:  CT head 09/19/2011 FINDINGS: Brain: 7 x 6 mm partially calcified likely dural based lesion along the right frontal convexity with some subjacent hyperostotic change is most compatible with a meningioma with minimal if any real adjacent mass effect. No adjacent edema. No evidence of acute infarct or hemorrhage. Cavum vergae is noted. Patchy areas of white matter hypoattenuation are most compatible with chronic microvascular angiopathy. Symmetric prominence of the ventricles, cisterns and sulci compatible with parenchymal volume loss. Vascular: Atherosclerotic calcification of the carotid siphons and intradural vertebral arteries. No hyperdense vessel. Skull: No calvarial  fracture or suspicious osseous lesion. No scalp swelling or hematoma. Sinuses/Orbits: Paranasal sinuses and mastoid air cells are predominantly clear. Included orbital structures are unremarkable. Other: None IMPRESSION: 1. No acute intracranial findings. 2. 7 mm partially calcified likely dural based lesion along the right frontal convexity with some subjacent hyperostotic change is most compatible with a meningioma. Appearance is stable from comparison studies. If further characterization is clinically warranted could consider outpatient MR. 3. Symmetric prominence of the ventricles, cisterns and sulci compatible with parenchymal volume loss. Patchy areas of white matter hypoattenuation are most compatible with chronic microvascular angiopathy. Electronically Signed   By: Lovena Le M.D.   On: 05/11/2019 23:23   Impression: Subacute anemia with 4 g drop in hemoglobin over the past several months.    No observed or documented blood loss (stool heme-negative), but with dual agent antiplatelet therapy and history of bleeding vascular ectasia of the small bowel, it seems likely that this patient has had intermittent low-grade GI tract blood loss.   Plan:  1. Continue to hold Plavix.     2. Monitor H/H.    3. Repeat hemoccult.    4.  Have scheduled the patient for push enteroscopy to be performed on Friday, by which time she will have been off her Plavix for an entire week, which I feel is important in view of anticipated possible argon plasma coagulation therapy of vascular ectasia of the small bowel, which have a tendency to ooze so we will need optimal platelet function.  I have discussed the nature, purpose, and risks of the procedure (with particular reference to the possibility of perforation from [facility] treatment of lesions of the small bowel) and the patient is agreeable.    LOS: 0 days   Youlanda Mighty Sylus Stgermain  05/12/2019, 3:04 PM   Pager [phone] If no answer or after 5 PM call [phone]

## 2019-05-12 NOTE — ED Notes (Signed)
Ordered a clear liquid breakfast--Kayla Mcclure

## 2019-05-12 NOTE — ED Notes (Signed)
Pt awaiting IV team, will adminster IVF afterward. Vitals WDL

## 2019-05-12 NOTE — Progress Notes (Addendum)
74 year old female with history of small bowel AVM treated with APC in 2017 with Dr. Watt Climes presented with lightheadedness and some shortness of breath and an episode of syncope about a week ago.  Was found to have low hemoglobin of 8.5, down from 12.0 just 2 months ago.  Seen and examined.  She has no history of melena, hematemesis, vomiting, bright red blood per rectum.  She tells me that she did not have any symptoms as such when she had GI bleed and was diagnosed with AVM in 2017 either.  1 FOBT has been negative.  We just checked another one that is also negative.  Her hemoglobin has dropped down to 7.9.  Iron studies indicate iron deficiency anemia.  Has low MCV as well.  We will recheck at 11:00 and then in 8 hours again.  I have switched her from oral Protonix to IV Protonix 40 mg twice daily.  I personally suspect that she is slowly bleeding from her AVM.  I called and discussed with on-call [facility] physician Dr. Cristina Gong who was gracious to accept the consultation and will see this patient.  We will continue her on clear liquids for now.  Her exam is completely benign.  She is alert and oriented.  Her chronic kidney disease stage IIIa is at her baseline.  Blood pressure slightly elevated.  Will resume all her home medications except diuretics.  We will continue to hold aspirin and Plavix.

## 2019-05-12 NOTE — ED Notes (Signed)
Assisting primary RN: Pt assisted on and off bedpan. repositioned in bed. Pt remains on continuous monitors. Call light placed within reach. Denies any other needs at this time. Will continue to monitor

## 2019-05-12 NOTE — H&P (Signed)
History and Physical    Kayla Mcclure KZS:[id number] DOB: [birth year] DOA: 05/11/2019  PCP: Lois Huxley, PA   Patient coming from: Home   Chief Complaint: Lightheaded, syncope on 05/05/19, low Hgb   HPI: Kayla Mcclure is a 74 y.o. female with medical history significant for CAD with stents, chronic kidney disease stage II, chronic diastolic CHF, history of GI bleeding attributed to small bowel AVM, now presenting to the emergency department for evaluation of lightheadedness, syncopal episode 1 week ago, and low hemoglobin on outpatient blood work.  Patient reports insidious development of exertional dyspnea without any fever or cough, and then had a syncopal episode 1 week ago.  She continues to be lightheaded upon standing, but denies any further syncope, denies chest pain, and denies palpitations.  She has not noted any change in bowel habits, abdominal pain, melena, or hematochezia.  She has history of GI bleeding a few years ago, had extensive work-up with GI that revealed angiodysplasia in the jejunum, treated with argon plasma, and had not experienced any further issues with this.  She takes aspirin and Plavix, previously took a PPI daily, but now only uses PPI as needed.  ED Course: Upon arrival to the ED, patient is found to be afebrile, saturating well on room air, and with stable blood pressure.  EKG features a sinus rhythm with PAC.  Noncontrast head CT is negative for acute intracranial abnormality but notable for stable 7 mm partially calcified lesion which appears stable and is likely a meningioma.  Chemistry panel notable for creatinine 1.32, up from 0.96 in November.  CBC features a microcytic anemia with hemoglobin 8.5, down from 12.0 in November.  Fecal occult blood testing is negative.  Type and screen was performed in the emergency department and COVID-19 screening test ordered but not yet resulted.  Review of Systems:  All other systems reviewed and apart from HPI,  are negative.  Past Medical History:  Diagnosis Date  . Asthma  10/31/2007  . CAD (coronary artery disease) 08/05/2007   a) NSTEMI 2009: PCI to the RCA Promus DES 2.5 mm x 23 mm (3.0 mm). b) Myoview June 2017: Nonspecific ST changes. EF greater than 65%. LOW RISK, normal study. No ischemia or infarction.  . Carotid artery disease ([place])    a. 50-69% RICA by outpt dopp 04/22/13, f/u due 04/2014.  Marland Kitchen Chronic back pain   . CKD (chronic kidney disease), stage II   . Diverticulosis   04/11/2010    Colonoscopy  . Dyslipidemia, goal LDL below 70   . Essential hypertension    a. Normal renal arteries by PV angio 05/2013.  Marland Kitchen GERD (gastroesophageal reflux disease)   . GI bleed 12/2015  . History of recent blood transfusion 12/2015   12-19-15, 12-20-15 and 12-21-15  . Insomnia   . Internal hemorrhoids  04/11/2010   By colonoscopy  . Iron deficiency anemia   . Myocardial infarction ([place]) 2009  . Peripheral arterial disease ([place])    a. s/p PTA and stenting of L CIA stenosis 05/2013.  Marland Kitchen Renal insufficiency   . Tobacco abuse    Quit in 05/2013    Past Surgical History:  Procedure Laterality Date  . CHOLECYSTECTOMY  1975  . COLONOSCOPY  04/11/2010  . colonscopy  11/30/2015  . CORONARY ANGIOPLASTY WITH STENT PLACEMENT  08/05/2007   Promus DES 2.5 mm x 23 mm (3 mm)  to RCA; EF 60-70%  . ENTEROSCOPY N/A 12/27/2015   Procedure: ENTEROSCOPY;  Surgeon:  Clarene Essex, MD;  Location: [facility];  Service: Endoscopy;  Laterality: N/A;  also needs slim egd scope  . ESOPHAGOGASTRODUODENOSCOPY N/A 09/13/2013   Procedure: ESOPHAGOGASTRODUODENOSCOPY (EGD);  Surgeon: Lear Ng, MD;  Location: [facility] ENDOSCOPY;  Service: Endoscopy;  Laterality: N/A;  . ESOPHAGOGASTRODUODENOSCOPY N/A 07/14/2015   Procedure: ESOPHAGOGASTRODUODENOSCOPY (EGD);  Surgeon: Clarene Essex, MD;  Location: [facility] ENDOSCOPY;  Service: Endoscopy;  Laterality: N/A;  . gi bleed  07/2015   blood given  . HOT HEMOSTASIS N/A 12/27/2015   Procedure:  HOT HEMOSTASIS (ARGON PLASMA COAGULATION/BICAP);  Surgeon: Clarene Essex, MD;  Location: [facility];  Service: Endoscopy;  Laterality: N/A;  . ILIAC ARTERY STENT Left 05/04/2013   L CIA-EIA -- Dr. Gwenlyn Found  . LEFT HEART CATH AND CORONARY ANGIOGRAPHY N/A 02/11/2019   Procedure: LEFT HEART CATH AND CORONARY ANGIOGRAPHY;  Surgeon: Leonie Man, MD;  Location: [facility] LAB;;-  Patent RCA stent with minimal ISR after pRCA 50%).  Small caliber RPAV with ~75%.  pLAD 30%, m-dLAD 40%, D2 40%.  Normal LVEDP and LVEF.  . LOWER EXTREMITY ANGIOGRAM Bilateral 05/04/2013   Procedure: LOWER EXTREMITY ANGIOGRAM;  Surgeon: Lorretta Harp, MD;  Location[facility] CATH LAB;  Service: Cardiovascular;  Laterality: Bilateral;  . NM MYOVIEW LTD  09/07/2015   EF > 65%. Nonspecific ST changes but no ischemic changes. No ischemia or infarction. LOW RISK  . TONSILLECTOMY  1960's  . TRANSTHORACIC ECHOCARDIOGRAM  09/21/2015   EF 60-65%. GR 1 DD. No regional wall motion analysis. No valve disease.  Marland Kitchen UPPER GI ENDOSCOPY  12/14/2015     reports that she quit smoking about 6 years ago. Her smoking use included cigarettes. She has never used smokeless tobacco. She reports current alcohol use. She reports that she does not use drugs.  No Known Allergies  Family History  Problem Relation Age of Onset  . Diabetes Mother   . Heart disease Mother   . Heart attack Mother 40  . Breast cancer Mother 13  . Colon cancer Mother 9  . Heart attack Father 76  . Pneumonia Father   . Diabetes Sister   . Breast cancer Sister   . Breast cancer Sister 36     Prior to Admission medications   Medication Sig Start Date End Date Taking? Authorizing Provider  amLODipine (NORVASC) 10 MG tablet TAKE 5 MG ( 1/2 TABLET) DAILY , IF BLLOD PRESSURE BECOMES  180 OR OVER TAKE  THE OTHER  5 MG ( 1/2 TABLET ) Patient taking differently: Take 5 mg by mouth daily.  02/24/19  Yes Leonie Man, MD  aspirin EC 81 MG tablet Take 81 mg by mouth daily.    Yes [provider]  atorvastatin (LIPITOR) 80 MG tablet Take 80 mg by mouth daily.    Yes [provider]  clopidogrel (PLAVIX) 75 MG tablet Take 1 tablet (75 mg total) by mouth daily. 02/04/19  Yes Leonie Man, MD  ezetimibe (ZETIA) 10 MG tablet Take 10 mg by mouth daily.   Yes [provider]  ferrous gluconate (FERGON) 324 MG tablet Take 1 tablet (324 mg total) by mouth 2 (two) times daily with a meal. 07/15/15  Yes Rai, Ripudeep K, MD  fluticasone furoate-vilanterol (BREO ELLIPTA) 100-25 MCG/INH AEPB Inhale 1 puff into the lungs daily.   Yes [provider]  furosemide (LASIX) 20 MG tablet Take 1 tablet (20 mg total) by mouth daily. 02/04/19 05/10/28 Yes Leonie Man, MD  gabapentin (NEURONTIN)  300 MG capsule Take 1 capsule (300 mg total) by mouth 2 (two) times daily. Patient taking differently: Take 300 mg by mouth 2 (two) times daily as needed (pain.).  06/20/16  Yes Melvenia Beam, MD  hydrALAZINE (APRESOLINE) 50 MG tablet MAY TAKE  1 TWICE DAY , IF BLOOD PRESSURE IS ABOVE 160/100 AS NEEDED Patient taking differently: Take 50 mg by mouth 2 (two) times daily as needed (IF BLOOD PRESSURE IS ABOVE 160/100).  02/24/19  Yes Leonie Man, MD  hydrochlorothiazide (HYDRODIURIL) 25 MG tablet Take 25 mg by mouth daily.   Yes [provider]  isosorbide mononitrate (IMDUR) 60 MG 24 hr tablet Take 1.5 tablets (90 mg total) by mouth daily. 08/08/15  Yes Brett Canales, PA-C  loratadine (CLARITIN) 10 MG tablet Take 10 mg by mouth daily as needed for allergies.   Yes [provider]  metoprolol succinate (TOPROL-XL) 100 MG 24 hr tablet Take 2 tablets (200 mg total) by mouth at bedtime. Take with or immediately following a meal. Patient taking differently: Take 150 mg by mouth at bedtime.  05/14/17  Yes Leonie Man, MD  nitroGLYCERIN (NITROSTAT) 0.4 MG SL tablet Place 1 tablet (0.4 mg total) under the tongue every 5 (five) minutes as needed  for chest pain. 02/04/19  Yes Leonie Man, MD  pantoprazole (PROTONIX) 40 MG tablet Take 1 tablet (40 mg total) by mouth daily. Patient taking differently: Take 40 mg by mouth daily as needed (acid reflux/indigestion.).  09/14/13  Yes Jonetta Osgood, MD    Physical Exam: Vitals:   05/11/19 2027 05/12/19 0000 05/12/19 0015 05/12/19 0030  BP: (!) 159/65 (!) 161/68 (!) 155/63 (!) 132/59  Pulse: 79 81 80 80  Resp: 16 (!) 22 20 16   Temp: 98.1 F (36.7 C)     TempSrc: Oral     SpO2: 98% 98% 95% 95%  Weight:      Height:         Constitutional: NAD, calm  Eyes: PERTLA, lids and conjunctivae normal ENMT: Mucous membranes are moist. Posterior pharynx clear of any exudate or lesions.   Neck: normal, supple, no masses, no thyromegaly Respiratory: clear to auscultation bilaterally, no wheezing, no crackles. No accessory muscle use.  Cardiovascular: S1 & S2 heard, regular rate and rhythm. Mild ankle edema bilaterally.   Abdomen: No distension, no tenderness, soft. Bowel sounds active.  Musculoskeletal: no clubbing / cyanosis. No joint deformity upper and lower extremities.   Skin: no significant rashes, lesions, ulcers. Warm, dry, well-perfused. Neurologic: No facial asymmetry. Sensation intact. Moving all extremities.  Psychiatric: Alert and oriented x 3. Pleasant and cooperative.    Labs and Imaging on Admission: I have personally reviewed following labs and imaging studies  CBC: Recent Labs  Lab 05/11/19 1721  WBC 6.8  HGB 8.5*  HCT 30.0*  MCV 75.8*  PLT 846   Basic Metabolic Panel: Recent Labs  Lab 05/11/19 1721  NA 138  K 3.7  CL 105  CO2 22  GLUCOSE 98  BUN 25*  CREATININE 1.32*  CALCIUM 9.0   GFR: Estimated Creatinine Clearance: 30.7 mL/min (A) (by C-G formula based on SCr of 1.32 mg/dL (H)). Liver Function Tests: Recent Labs  Lab 05/11/19 1721  AST 26  ALT 19  ALKPHOS 67  BILITOT 0.4  PROT 7.4  ALBUMIN 3.3*   No results for input(s): LIPASE,  AMYLASE in the last 168 hours. No results for input(s): AMMONIA in the last 168 hours. Coagulation  Profile: No results for input(s): INR, PROTIME in the last 168 hours. Cardiac Enzymes: No results for input(s): CKTOTAL, CKMB, CKMBINDEX, TROPONINI in the last 168 hours. BNP (last 3 results) No results for input(s): PROBNP in the last 8760 hours. HbA1C: No results for input(s): HGBA1C in the last 72 hours. CBG: No results for input(s): GLUCAP in the last 168 hours. Lipid Profile: No results for input(s): CHOL, HDL, LDLCALC, TRIG, CHOLHDL, LDLDIRECT in the last 72 hours. Thyroid Function Tests: No results for input(s): TSH, T4TOTAL, FREET4, T3FREE, THYROIDAB in the last 72 hours. Anemia Panel: Recent Labs    05/11/19 2314  RETICCTPCT 0.9   Urine analysis:    Component Value Date/Time   COLORURINE lt. yellow 12/30/2007 1014   APPEARANCEUR Clear 12/30/2007 1014   LABSPEC 1.025 06/07/2009 0925   PHURINE 5.0 06/07/2009 0925   HGBUR negative 06/07/2009 0925   BILIRUBINUR negative 06/07/2009 0925   UROBILINOGEN 0.2 06/07/2009 0925   NITRITE negative 06/07/2009 0925   Sepsis Labs: @LABRCNTIP (procalcitonin:4,lacticidven:4) )No results found for this or any previous visit (from the past 240 hour(s)).   Radiological Exams on Admission: CT Head Wo Contrast  Result Date: 05/11/2019 CLINICAL DATA:  Syncope on Tuesday, now with dizziness EXAM: CT HEAD WITHOUT CONTRAST TECHNIQUE: Contiguous axial images were obtained from the base of the skull through the vertex without intravenous contrast. COMPARISON:  CT head 09/19/2011 FINDINGS: Brain: 7 x 6 mm partially calcified likely dural based lesion along the right frontal convexity with some subjacent hyperostotic change is most compatible with a meningioma with minimal if any real adjacent mass effect. No adjacent edema. No evidence of acute infarct or hemorrhage. Cavum vergae is noted. Patchy areas of white matter hypoattenuation are most  compatible with chronic microvascular angiopathy. Symmetric prominence of the ventricles, cisterns and sulci compatible with parenchymal volume loss. Vascular: Atherosclerotic calcification of the carotid siphons and intradural vertebral arteries. No hyperdense vessel. Skull: No calvarial fracture or suspicious osseous lesion. No scalp swelling or hematoma. Sinuses/Orbits: Paranasal sinuses and mastoid air cells are predominantly clear. Included orbital structures are unremarkable. Other: None IMPRESSION: 1. No acute intracranial findings. 2. 7 mm partially calcified likely dural based lesion along the right frontal convexity with some subjacent hyperostotic change is most compatible with a meningioma. Appearance is stable from comparison studies. If further characterization is clinically warranted could consider outpatient MR. 3. Symmetric prominence of the ventricles, cisterns and sulci compatible with parenchymal volume loss. Patchy areas of white matter hypoattenuation are most compatible with chronic microvascular angiopathy. Electronically Signed   By: Lovena Le M.D.   On: 05/11/2019 23:23    EKG: Independently reviewed. Sinus rhythm, PAC.   Assessment/Plan   1. Symptomatic anemia  - Presents with exertional dyspnea and lightheadedness on standing and is found to have Hgb of 8.5, down from 12.0 two months ago  - Patient denies melena or hematochezia and FOBT is negative  - She has hx of GIB in 2017 with extensive GI workup revealing angiodysplasia in jejunum that was ablated  - Type and screen sent from ED, anemia panel pending  - Hold ASA and Plavix initially, hydrate with IVF, monitor H&H, transfuse as needed  2. Acute kidney injury superimposed on CKD II  - SCr is 1.32 on admission, up from 0.96 two months ago  - Likely prerenal azotemia given recent resolution of chronic b/l leg swelling and orthostasis   - Hold diuretics, hydrate with IVF, renally-dose medications, and repeat chem  panel in am  3. CAD  - No anginal complaints  - Hold ASA and Plavix initially, continue statin and nitrates    4. Chronic diastolic CHF  - Appears compensated  - Hold diuretics and administer IVF given lightheadedness on standing and AKI, monitor daily weights    5. Asthma  - No cough or wheezing, patient denies any recent need for albuterol - Continue ICS/LABA     DVT prophylaxis: SCD's  Code Status: Full  Family Communication: Discussed with patient  Disposition Plan: From home and anticipate return to home if Hgb remains stable though if drops further may need specialist consultation  Consults called: None  Admission status: Observation    Vianne Bulls, MD [facility] Pager: See [email or web address]  If 7AM-7PM, please contact the daytime attending [email or web address]  05/12/2019, 12:46 AM

## 2019-05-12 NOTE — ED Notes (Signed)
Family at bedside. 

## 2019-05-12 NOTE — ED Notes (Signed)
Breakfast Ordered 

## 2019-05-13 DIAGNOSIS — K222 Esophageal obstruction: Secondary | ICD-10-CM | POA: Diagnosis present

## 2019-05-13 DIAGNOSIS — Z95820 Peripheral vascular angioplasty status with implants and grafts: Secondary | ICD-10-CM | POA: Diagnosis not present

## 2019-05-13 DIAGNOSIS — I6523 Occlusion and stenosis of bilateral carotid arteries: Secondary | ICD-10-CM | POA: Diagnosis present

## 2019-05-13 DIAGNOSIS — N179 Acute kidney failure, unspecified: Secondary | ICD-10-CM | POA: Diagnosis present

## 2019-05-13 DIAGNOSIS — K922 Gastrointestinal hemorrhage, unspecified: Secondary | ICD-10-CM | POA: Diagnosis present

## 2019-05-13 DIAGNOSIS — Z87891 Personal history of nicotine dependence: Secondary | ICD-10-CM | POA: Diagnosis not present

## 2019-05-13 DIAGNOSIS — R202 Paresthesia of skin: Secondary | ICD-10-CM | POA: Diagnosis not present

## 2019-05-13 DIAGNOSIS — I130 Hypertensive heart and chronic kidney disease with heart failure and stage 1 through stage 4 chronic kidney disease, or unspecified chronic kidney disease: Secondary | ICD-10-CM | POA: Diagnosis present

## 2019-05-13 DIAGNOSIS — I5032 Chronic diastolic (congestive) heart failure: Secondary | ICD-10-CM | POA: Diagnosis present

## 2019-05-13 DIAGNOSIS — R7303 Prediabetes: Secondary | ICD-10-CM | POA: Diagnosis present

## 2019-05-13 DIAGNOSIS — I252 Old myocardial infarction: Secondary | ICD-10-CM | POA: Diagnosis not present

## 2019-05-13 DIAGNOSIS — Z7902 Long term (current) use of antithrombotics/antiplatelets: Secondary | ICD-10-CM | POA: Diagnosis not present

## 2019-05-13 DIAGNOSIS — J449 Chronic obstructive pulmonary disease, unspecified: Secondary | ICD-10-CM | POA: Diagnosis present

## 2019-05-13 DIAGNOSIS — Z8249 Family history of ischemic heart disease and other diseases of the circulatory system: Secondary | ICD-10-CM | POA: Diagnosis not present

## 2019-05-13 DIAGNOSIS — I6389 Other cerebral infarction: Secondary | ICD-10-CM | POA: Diagnosis not present

## 2019-05-13 DIAGNOSIS — E785 Hyperlipidemia, unspecified: Secondary | ICD-10-CM | POA: Diagnosis present

## 2019-05-13 DIAGNOSIS — Z20822 Contact with and (suspected) exposure to covid-19: Secondary | ICD-10-CM | POA: Diagnosis present

## 2019-05-13 DIAGNOSIS — K219 Gastro-esophageal reflux disease without esophagitis: Secondary | ICD-10-CM | POA: Diagnosis present

## 2019-05-13 DIAGNOSIS — D500 Iron deficiency anemia secondary to blood loss (chronic): Secondary | ICD-10-CM | POA: Diagnosis present

## 2019-05-13 DIAGNOSIS — I708 Atherosclerosis of other arteries: Secondary | ICD-10-CM | POA: Diagnosis present

## 2019-05-13 DIAGNOSIS — K449 Diaphragmatic hernia without obstruction or gangrene: Secondary | ICD-10-CM | POA: Diagnosis present

## 2019-05-13 DIAGNOSIS — Z955 Presence of coronary angioplasty implant and graft: Secondary | ICD-10-CM | POA: Diagnosis not present

## 2019-05-13 DIAGNOSIS — Z833 Family history of diabetes mellitus: Secondary | ICD-10-CM | POA: Diagnosis not present

## 2019-05-13 DIAGNOSIS — D320 Benign neoplasm of cerebral meninges: Secondary | ICD-10-CM | POA: Diagnosis present

## 2019-05-13 DIAGNOSIS — I2510 Atherosclerotic heart disease of native coronary artery without angina pectoris: Secondary | ICD-10-CM | POA: Diagnosis present

## 2019-05-13 DIAGNOSIS — D649 Anemia, unspecified: Secondary | ICD-10-CM | POA: Diagnosis present

## 2019-05-13 DIAGNOSIS — I10 Essential (primary) hypertension: Secondary | ICD-10-CM | POA: Diagnosis not present

## 2019-05-13 LAB — HEMOGLOBIN
Hemoglobin: 7.7 g/dL — ABNORMAL LOW (ref 12.0–15.0)
Hemoglobin: 8 g/dL — ABNORMAL LOW (ref 12.0–15.0)

## 2019-05-13 LAB — BASIC METABOLIC PANEL
Anion gap: 5 (ref 5–15)
BUN: 16 mg/dL (ref 8–23)
CO2: 24 mmol/L (ref 22–32)
Calcium: 8.8 mg/dL — ABNORMAL LOW (ref 8.9–10.3)
Chloride: 109 mmol/L (ref 98–111)
Creatinine, Ser: 1.25 mg/dL — ABNORMAL HIGH (ref 0.44–1.00)
GFR calc Af Amer: 50 mL/min — ABNORMAL LOW (ref >60)
GFR calc non Af Amer: 43 mL/min — ABNORMAL LOW (ref >60)
Glucose, Bld: 87 mg/dL (ref 70–99)
Potassium: 4.7 mmol/L (ref 3.5–5.1)
Sodium: 138 mmol/L (ref 135–145)

## 2019-05-13 LAB — HEMATOCRIT
HCT: 26.3 % — ABNORMAL LOW (ref 36.0–46.0)
HCT: 27.7 % — ABNORMAL LOW (ref 36.0–46.0)

## 2019-05-13 NOTE — Progress Notes (Signed)
GASTROENTEROLOGY PROGRESS NOTE  Problem:   Anemia  Subjective: Patient reports she is feeling well this morning and denies dizziness.  She reports she is hungry and interested in ordering a solid diet.  She has not had a bowel movement since Monday, which she attributes to her liquid diet, and she reports she does not feel constipated.  She denies passing any BRBPR.    Objective: General: lying comfortably in bed in no acute distress Lungs: Breathing comfortably on room air GI: Soft, nondistended, and nontender.  No rebound or guarding.  Normoactive bowel sounds in all 4 quadrants.  Hgb stable 8.0  Assessment: Subacute anemia with concern for intermittent low-grade GI blood loss.  Plan: 1. Continue to hold Plavix and Aspirin.     2. Monitor H/H, but agree with decrease in frequency to qAM.    3. Await hemoccult if pt has further BM's (repeat occult blood stool test ordered).    4.  Enteroscopy on Friday 2/12 with possible argon plasma coagulation therapy of vascular ectasia of the small bowel.    5. Soft diet ordered while awaiting enteroscopy.  Cleotis Nipper, M.D. 05/13/2019 11:23 AM  Pager [phone] If no answer or after 5 PM call [phone]

## 2019-05-13 NOTE — Progress Notes (Signed)
Progress Note    Kayla Mcclure  OBS:[id number] DOB: [birth year]  DOA: 05/11/2019 PCP: Lois Huxley, PA    Brief Narrative:   Chief complaint: Lightheadedness/shortness of breath/syncope on February 2  Medical records reviewed and are as summarized below:  Kayla Mcclure is a very pleasant 74 y.o. female with a past medical history significant for CAD status post stents, chronic kidney disease stage II, chronic diastolic heart failure, history of GI bleeding attributed to small bowel AVM presented to the emergency department complaints of lightheadedness, syncopal event 1 week prior and low hemoglobin per outpatient work-up.  Patient was admitted for symptomatic anemia likely GI bleed in setting of Plavix and asa  Assessment/Plan:   Principal Problem:   Symptomatic anemia Active Problems:   CAD S/P percutaneous coronary angioplasty -- Promus DES 2.5 mm x 20 mm postdilated to 3 mm   Syncope   Essential hypertension, benign   Acute renal failure superimposed on stage 2 chronic kidney disease (HCC)   Chronic diastolic CHF (congestive heart failure) (HCC)   Asthma  . 1.  Symptomatic anemia.  Globin this morning 8.0.  Chart review indicates hemoglobin 4 months ago was 12.0.  Medications include aspirin and Plavix.  FOBT negative.  History of GI bleed 2017.  Recent ferritin 5 and TIBC 482 with saturation ratio 9.  Home medications include Fergon.  Evaluated by gastroenterology who opined likely intermittent low-grade GI tract blood loss.  Recommending push enteroscopy in 2 days -Soft diet until tomorrow -Continue to hold aspirin and Plavix -Serial CBCs -Consider transfusion if hemoglobin drops to 7 or less  #2.  Acute kidney injury superimposed on chronic kidney disease stage II.  Creatinine 1.32 on admission.  Provided with gentle IV fluids.  Creatinine 1.25.  This appears to be close to baseline -Monitor urine output -Gentle IV fluids -Hold nephrotoxins -Recheck in  the morning  #3.  CAD.  Status post stents no chest pain. -Holding aspirin and Plavix  #4.  Chronic diastolic heart failure.  Appears compensated.  Home medications include Lasix, hydrochlorothiazide, Imdur, metoprolol -Holding diuretics -intake and output -daily weight    Family Communication/Anticipated D/C date and plan/Code Status   DVT prophylaxis: Lovenox ordered. Code Status: Full Code.  Family Communication: patient isposition Plan: home hopefully tomorrow   Medical Consultants:    Buccini   Anti-Infectives:    None  Subjective:   Awake alert sitting up in bed watching TV.  Denies pain or discomfort.  Requesting food  Objective:    Vitals:   05/13/19 0006 05/13/19 0008 05/13/19 0633 05/13/19 0807  BP: (!) 102/59  132/69   Pulse: 70 (!) 56 62   Resp: 16  16   Temp: 98.5 F (36.9 C)  98.2 F (36.8 C)   TempSrc: Oral  Oral   SpO2: (!) 88% 91% 100% 94%  Weight:   59.5 kg   Height:        Intake/Output Summary (Last 24 hours) at 05/13/2019 1218 Last data filed at 05/13/2019 4765 Gross per 24 hour  Intake 3 ml  Output --  Net 3 ml   Filed Weights   05/11/19 1712 05/12/19 0632 05/13/19 0633  Weight: 61.2 kg 60.6 kg 59.5 kg    Exam: General: Awake alert well-nourished no acute distress CV: Regular rate and rhythm no murmur gallop or rub Respiratory: No increased work of breathing breath sounds are clear bilaterally I hear no crackles no wheezes Abdomen: Nondistended soft positive bowel sounds  throughout no guarding or rebounding palpation Musculoskeletal: Joints without swelling/erythema is all extremities spontaneously  Data Reviewed:   I have personally reviewed following labs and imaging studies:  Labs: Labs show the following:   Basic Metabolic Panel: Recent Labs  Lab 05/11/19 1721 05/11/19 1721 05/12/19 0613 05/13/19 0259  NA 138  --  140 138  K 3.7   < > 4.0 4.7  CL 105  --  110 109  CO2 22  --  21* 24  GLUCOSE 98  --  81 87   BUN 25*  --  19 16  CREATININE 1.32*  --  1.13* 1.25*  CALCIUM 9.0  --  8.8* 8.8*   < > = values in this interval not displayed.   GFR Estimated Creatinine Clearance: 31.9 mL/min (A) (by C-G formula based on SCr of 1.25 mg/dL (H)). Liver Function Tests: Recent Labs  Lab 05/11/19 1721  AST 26  ALT 19  ALKPHOS 67  BILITOT 0.4  PROT 7.4  ALBUMIN 3.3*   No results for input(s): LIPASE, AMYLASE in the last 168 hours. No results for input(s): AMMONIA in the last 168 hours. Coagulation profile No results for input(s): INR, PROTIME in the last 168 hours.  CBC: Recent Labs  Lab 05/11/19 1721 05/11/19 1721 05/12/19 0613 05/12/19 1142 05/12/19 1857 05/13/19 0259 05/13/19 1028  WBC 6.8  --   --   --   --   --   --   HGB 8.5*   < > 7.9* 7.9* 7.7* 7.7* 8.0*  HCT 30.0*   < > 27.3* 26.6* 26.3* 26.3* 27.7*  MCV 75.8*  --   --   --   --   --   --   PLT 222  --   --   --   --   --   --    < > = values in this interval not displayed.   Cardiac Enzymes: No results for input(s): CKTOTAL, CKMB, CKMBINDEX, TROPONINI in the last 168 hours. BNP (last 3 results) No results for input(s): PROBNP in the last 8760 hours. CBG: No results for input(s): GLUCAP in the last 168 hours. D-Dimer: No results for input(s): DDIMER in the last 72 hours. Hgb A1c: No results for input(s): HGBA1C in the last 72 hours. Lipid Profile: No results for input(s): CHOL, HDL, LDLCALC, TRIG, CHOLHDL, LDLDIRECT in the last 72 hours. Thyroid function studies: No results for input(s): TSH, T4TOTAL, T3FREE, THYROIDAB in the last 72 hours.  Invalid input(s): FREET3 Anemia work up: Recent Labs    05/11/19 2314  VITAMINB12 289  FOLATE 11.9  FERRITIN 5*  TIBC 482*  IRON 42  RETICCTPCT 0.9   Sepsis Labs: Recent Labs  Lab 05/11/19 1721  WBC 6.8    Microbiology Recent Results (from the past 240 hour(s))  SARS CORONAVIRUS 2 (TAT 6-24 HRS) Nasopharyngeal Nasopharyngeal Swab     Status: None   Collection  Time: 05/12/19 12:58 AM   Specimen: Nasopharyngeal Swab  Result Value Ref Range Status   SARS Coronavirus 2 NEGATIVE NEGATIVE Final    Comment: (NOTE) SARS-CoV-2 target nucleic acids are NOT DETECTED. The SARS-CoV-2 RNA is generally detectable in upper and lower respiratory specimens during the acute phase of infection. Negative results do not preclude SARS-CoV-2 infection, do not rule out co-infections with other pathogens, and should not be used as the sole basis for treatment or other patient management decisions. Negative results must be combined with clinical observations, patient history, and epidemiological information. The  expected result is Negative. Fact Sheet for Patients: SugarRoll.be Fact Sheet for Healthcare Providers: https://www.woods-mathews.com/ This test is not yet approved or cleared by the [place] FDA and  has been authorized for detection and/or diagnosis of SARS-CoV-2 by FDA under an Emergency Use Authorization (EUA). This EUA will remain  in effect (meaning this test can be used) for the duration of the COVID-19 declaration under Section 56 4(b)(1) of the Act, 21 U.S.C. section 360bbb-3(b)(1), unless the authorization is terminated or revoked sooner. Performed at [facility], [place] [address] [place], [place] [address]     Procedures and diagnostic studies:  CT Head Wo Contrast  Result Date: 05/11/2019 CLINICAL DATA:  Syncope on Tuesday, now with dizziness EXAM: CT HEAD WITHOUT CONTRAST TECHNIQUE: Contiguous axial images were obtained from the base of the skull through the vertex without intravenous contrast. COMPARISON:  CT head 09/19/2011 FINDINGS: Brain: 7 x 6 mm partially calcified likely dural based lesion along the right frontal convexity with some subjacent hyperostotic change is most compatible with a meningioma with minimal if any real adjacent mass effect. No adjacent edema. No evidence of acute  infarct or hemorrhage. Cavum vergae is noted. Patchy areas of white matter hypoattenuation are most compatible with chronic microvascular angiopathy. Symmetric prominence of the ventricles, cisterns and sulci compatible with parenchymal volume loss. Vascular: Atherosclerotic calcification of the carotid siphons and intradural vertebral arteries. No hyperdense vessel. Skull: No calvarial fracture or suspicious osseous lesion. No scalp swelling or hematoma. Sinuses/Orbits: Paranasal sinuses and mastoid air cells are predominantly clear. Included orbital structures are unremarkable. Other: None IMPRESSION: 1. No acute intracranial findings. 2. 7 mm partially calcified likely dural based lesion along the right frontal convexity with some subjacent hyperostotic change is most compatible with a meningioma. Appearance is stable from comparison studies. If further characterization is clinically warranted could consider outpatient MR. 3. Symmetric prominence of the ventricles, cisterns and sulci compatible with parenchymal volume loss. Patchy areas of white matter hypoattenuation are most compatible with chronic microvascular angiopathy. Electronically Signed   By: Lovena Le M.D.   On: 05/11/2019 23:23    Medications:   . amLODipine  5 mg Oral Daily  . atorvastatin  80 mg Oral q1800  . ezetimibe  10 mg Oral Daily  . ferrous gluconate  324 mg Oral BID WC  . fluticasone furoate-vilanterol  1 puff Inhalation Daily  . gabapentin  300 mg Oral BID  . isosorbide mononitrate  90 mg Oral Daily  . metoprolol succinate  150 mg Oral QHS  . pantoprazole (PROTONIX) IV  40 mg Intravenous Q12H  . sodium chloride flush  3 mL Intravenous Q12H   Continuous Infusions:   LOS: 0 days   Radene Gunning NP [facility]   How to contact the [facility] Attending or Consulting provider [place] or covering provider during after hours [place], for this patient?  1. Check the care team in [facility] and look for a) attending/consulting  [facility] provider listed and b) the [facility] team listed 2. Log into [email or web address] and use [place]'s universal password to access. If you do not have the password, please contact the hospital operator. 3. Locate the [facility] provider you are looking for under [facility] and page to a number that you can be directly reached. 4. If you still have difficulty reaching the provider, please page the [facility] (Director on Call) for the Hospitalists listed on amion for assistance.  05/13/2019, 12:18 PM

## 2019-05-14 ENCOUNTER — Inpatient Hospital Stay (HOSPITAL_COMMUNITY): Payer: Medicare Other

## 2019-05-14 ENCOUNTER — Encounter (HOSPITAL_COMMUNITY): Payer: Self-pay | Admitting: Internal Medicine

## 2019-05-14 DIAGNOSIS — I6389 Other cerebral infarction: Secondary | ICD-10-CM

## 2019-05-14 DIAGNOSIS — I5032 Chronic diastolic (congestive) heart failure: Secondary | ICD-10-CM | POA: Diagnosis present

## 2019-05-14 HISTORY — PX: TRANSTHORACIC ECHOCARDIOGRAM: SHX275

## 2019-05-14 LAB — CBC
HCT: 26.8 % — ABNORMAL LOW (ref 36.0–46.0)
Hemoglobin: 7.7 g/dL — ABNORMAL LOW (ref 12.0–15.0)
MCH: 21.4 pg — ABNORMAL LOW (ref 26.0–34.0)
MCHC: 28.7 g/dL — ABNORMAL LOW (ref 30.0–36.0)
MCV: 74.7 fL — ABNORMAL LOW (ref 80.0–100.0)
Platelets: 197 10*3/uL (ref 150–400)
RBC: 3.59 MIL/uL — ABNORMAL LOW (ref 3.87–5.11)
RDW: 18.5 % — ABNORMAL HIGH (ref 11.5–15.5)
WBC: 5.6 10*3/uL (ref 4.0–10.5)
nRBC: 0 % (ref 0.0–0.2)

## 2019-05-14 LAB — BASIC METABOLIC PANEL
Anion gap: 7 (ref 5–15)
BUN: 22 mg/dL (ref 8–23)
CO2: 24 mmol/L (ref 22–32)
Calcium: 8.6 mg/dL — ABNORMAL LOW (ref 8.9–10.3)
Chloride: 108 mmol/L (ref 98–111)
Creatinine, Ser: 1.76 mg/dL — ABNORMAL HIGH (ref 0.44–1.00)
GFR calc Af Amer: 33 mL/min — ABNORMAL LOW (ref >60)
GFR calc non Af Amer: 28 mL/min — ABNORMAL LOW (ref >60)
Glucose, Bld: 102 mg/dL — ABNORMAL HIGH (ref 70–99)
Potassium: 4.6 mmol/L (ref 3.5–5.1)
Sodium: 139 mmol/L (ref 135–145)

## 2019-05-14 LAB — ECHOCARDIOGRAM COMPLETE
Height: 59 in
Weight: 2113.6 oz

## 2019-05-14 LAB — GLUCOSE, CAPILLARY: Glucose-Capillary: 89 mg/dL (ref 70–99)

## 2019-05-14 MED ORDER — SODIUM CHLORIDE 0.9 % IV SOLN: INTRAVENOUS | Status: DC

## 2019-05-14 MED ORDER — STROKE: EARLY STAGES OF RECOVERY BOOK
Freq: Once | Status: AC
  Filled 2019-05-14: qty 1

## 2019-05-14 MED ORDER — METOPROLOL SUCCINATE ER 50 MG PO TB24
50.0000 mg | ORAL_TABLET | Freq: Every day | ORAL | Status: DC
  Administered 2019-05-14 – 2019-05-15 (×2): 50 mg via ORAL
  Filled 2019-05-14 (×2): qty 1

## 2019-05-14 NOTE — Progress Notes (Signed)
  Echocardiogram 2D Echocardiogram has been performed.  [name] 05/14/2019, 3:40 PM

## 2019-05-14 NOTE — Progress Notes (Signed)
Called to re-evaluate patient by rapid response for change in neurologic status.  Stuttering and word finding difficulties were initially noticed by family on the phone.  Patient does appear to have facial asymmetry.  Code stroke has been called-- CT scan normal-- will initiate regular stroke work up.  Has been off ASA/plavix for GI intervention in the AM.  Have requested transfer to neuro tele bed. Eulogio Bear

## 2019-05-14 NOTE — Progress Notes (Signed)
Rings,neckless,bracelet taken home by son Mr Kayla Mcclure

## 2019-05-14 NOTE — Progress Notes (Signed)
Report called to [facility]

## 2019-05-14 NOTE — Progress Notes (Addendum)
Patient called for assistance, stated that her husband and granddaughter noticed stuttering speech while they were talking on phone. Neuro assessment and vital done. Rapid response called and came to assess patient.

## 2019-05-14 NOTE — Consult Note (Signed)
Neurology Consultation  Referring Physician: Dr. Eliseo Squires  CC: Stuttering speech and right side numbness   History is obtained from:  HPI: Kayla Mcclure is a 74 y.o. female with history of tobacco abuse, GI bleed, essential hypertension, dyslipidemia, chronic kidney disease, carotid artery disease with right ICA 50-69% stenosed.  Patiently was initially brought to the hospital secondary of low hemoglobin.  GI saw patient secondary to having 3 duodenal AVMs in the past.  Scheduled for a push enteroscopy to evaluate for GI bleeding this Friday secondary to her hemoglobin of 7.7.  At approximately 10:00 she was noted to have abnormal speech along with right-sided decreased sensation.  She was talking to her husband on the phone at approximately that time he noted that her speech was abnormal.  Later in the day she was talking to another family member who also noted that her speech was abnormal and she did admit to having decreased sensation on the right arm and leg.  At that time rapid response was called and code stroke was initiated.  Patient was immediately brought down to CT to evaluate for intraparenchymal stroke or bleed.   ED course  CT head shows-CT head  Chart review (patient has been seen by Dr. Lavell Anchors back in 06/15/2016 for carpal tunnel)  Work up that has been done:  CT head Labs: Creatinine 1.76, GFR 33, RBC 3.59, hemoglobin 7.7, platelets 197  LKW: 1000 tpa given?: no, GI bleed Premorbid modified Rankin scale (mRS): 0 NIH stroke score: 3   Past Medical History:  Diagnosis Date  . Asthma  10/31/2007  . CAD (coronary artery disease) 08/05/2007   a) NSTEMI 2009: PCI to the RCA Promus DES 2.5 mm x 23 mm (3.0 mm). b) Myoview June 2017: Nonspecific ST changes. EF greater than 65%. LOW RISK, normal study. No ischemia or infarction.  . Carotid artery disease ([place])    a. 50-69% RICA by outpt dopp 04/22/13, f/u due 04/2014.  Marland Kitchen Chronic back pain   . Chronic diastolic heart failure  ([place])   . CKD (chronic kidney disease), stage II   . Diverticulosis   04/11/2010    Colonoscopy  . Dyslipidemia, goal LDL below 70   . Essential hypertension    a. Normal renal arteries by PV angio 05/2013.  Marland Kitchen GERD (gastroesophageal reflux disease)   . GI bleed 12/2015  . History of recent blood transfusion 12/2015   12-19-15, 12-20-15 and 12-21-15  . Insomnia   . Internal hemorrhoids  04/11/2010   By colonoscopy  . Iron deficiency anemia   . Myocardial infarction ([place]) 2009  . Peripheral arterial disease ([place])    a. s/p PTA and stenting of L CIA stenosis 05/2013.  Marland Kitchen Renal insufficiency   . Tobacco abuse    Quit in 05/2013    Family History  Problem Relation Age of Onset  . Diabetes Mother   . Heart disease Mother   . Heart attack Mother 34  . Breast cancer Mother 44  . Colon cancer Mother 19  . Heart attack Father 60  . Pneumonia Father   . Diabetes Sister   . Breast cancer Sister   . Breast cancer Sister 39   Social History:   reports that she quit smoking about 6 years ago. Her smoking use included cigarettes. She has never used smokeless tobacco. She reports current alcohol use. She reports that she does not use drugs.  Medications  Current Facility-Administered Medications:  .  0.9 %  sodium chloride infusion, ,  Intravenous, Continuous, Black, Lezlie Octave, NP, Last Rate: 50 mL/hr at 05/14/19 0904, New Bag at 05/14/19 0904 .  acetaminophen (TYLENOL) tablet 650 mg, 650 mg, Oral, Q6H PRN, 650 mg at 05/13/19 1747 **OR** acetaminophen (TYLENOL) suppository 650 mg, 650 mg, Rectal, Q6H PRN, Opyd, Timothy S, MD .  amLODipine (NORVASC) tablet 5 mg, 5 mg, Oral, Daily, Pahwani, Ravi, MD, 5 mg at 05/14/19 0904 .  atorvastatin (LIPITOR) tablet 80 mg, 80 mg, Oral, q1800, Opyd, Ilene Qua, MD, 80 mg at 05/13/19 1746 .  ezetimibe (ZETIA) tablet 10 mg, 10 mg, Oral, Daily, Pahwani, Ravi, MD, 10 mg at 05/14/19 0905 .  ferrous gluconate (FERGON) tablet 324 mg, 324 mg, Oral, BID WC, Pahwani,  Ravi, MD, 324 mg at 05/14/19 0905 .  fluticasone furoate-vilanterol (BREO ELLIPTA) 100-25 MCG/INH 1 puff, 1 puff, Inhalation, Daily, Opyd, Ilene Qua, MD, 1 puff at 05/14/19 0815 .  gabapentin (NEURONTIN) capsule 300 mg, 300 mg, Oral, BID, Pahwani, Ravi, MD, 300 mg at 05/14/19 0904 .  isosorbide mononitrate (IMDUR) 24 hr tablet 90 mg, 90 mg, Oral, Daily, Opyd, Ilene Qua, MD, 90 mg at 05/14/19 0905 .  metoprolol succinate (TOPROL-XL) 24 hr tablet 150 mg, 150 mg, Oral, QHS, Pahwani, Ravi, MD, 150 mg at 05/13/19 2122 .  ondansetron (ZOFRAN) tablet 4 mg, 4 mg, Oral, Q6H PRN **OR** ondansetron (ZOFRAN) injection 4 mg, 4 mg, Intravenous, Q6H PRN, Opyd, Timothy S, MD .  pantoprazole (PROTONIX) injection 40 mg, 40 mg, Intravenous, Q12H, Pahwani, Ravi, MD, 40 mg at 05/14/19 0904 .  sodium chloride flush (NS) 0.9 % injection 3 mL, 3 mL, Intravenous, Q12H, Opyd, Ilene Qua, MD, 3 mL at 05/14/19 0905  ROS:     General ROS: negative for - chills, fatigue, fever, night sweats, weight gain or weight loss Psychological ROS: negative for - behavioral disorder, hallucinations, memory difficulties, mood swings or suicidal ideation Ophthalmic ROS: negative for - blurry vision, double vision, eye pain or loss of vision ENT ROS: negative for - epistaxis, nasal discharge, oral lesions, sore throat, tinnitus or vertigo Respiratory ROS: negative for - cough, hemoptysis, shortness of breath or wheezing Cardiovascular ROS: negative for - chest pain, dyspnea on exertion, edema or irregular heartbeat Gastrointestinal ROS: negative for - abdominal pain, diarrhea, hematemesis, nausea/vomiting or stool incontinence Genito-Urinary ROS: negative for - dysuria, hematuria, incontinence or urinary frequency/urgency Musculoskeletal ROS: negative for - joint swelling or muscular weakness Neurological ROS: as noted in HPI Dermatological ROS: negative for rash and skin lesion changes  Exam: Current vital signs: BP (!) 111/48 (BP  Location: Left Arm)   Pulse 65   Temp 98.4 F (36.9 C) (Oral)   Resp 18   Ht 4\' 11"  (1.499 m)   Wt 59.9 kg   SpO2 100%   BMI 26.68 kg/m  Vital signs in last 24 hours: Temp:  [98 F (36.7 C)-98.4 F (36.9 C)] 98.4 F (36.9 C) (02/11 1222) Pulse Rate:  [63-83] 65 (02/11 1222) Resp:  [18] 18 (02/11 1222) BP: (101-114)/(48-57) 111/48 (02/11 1222) SpO2:  [91 %-100 %] 100 % (02/11 1222) Weight:  [59.9 kg] 59.9 kg (02/11 0454)   Constitutional: Appears well-developed and well-nourished.  Psych: Affect appropriate to situation Eyes: No scleral injection HENT: No OP obstrucion Head: Normocephalic.  Cardiovascular: Normal rate and regular rhythm.  Respiratory: Effort normal, non-labored breathing GI: Soft.  No distension. There is no tenderness.  Skin: WDI  Neuro: Mental Status: Patient is awake, alert, oriented to person, place,  Speech-showed no aphasia however  did show dysarthria. Cranial Nerves: II: Visual Fields are full.  III,IV, VI: EOMI without ptosis or diploplia. Pupils equal, round and reactive to light V: Facial sensation is symmetric to temperature VII: Facial movement is symmetric.  VIII: hearing is intact to voice X: Palat elevates symmetrically XI: Shoulder shrug is symmetric. XII: tongue is midline without atrophy or fasciculations.  Motor: Tone is normal. Bulk is normal. 5/5 strength was present in all four extremities.  No drift Sensory: Sensation decreased on the right arm and leg DSS Deep Tendon Reflexes: 2+ and symmetric in the biceps and patellae.  Plantars: Toes are downgoing bilaterally.  Cerebellar: FNF and HKS are intact bilaterally  Labs I have reviewed labs in epic and the results pertinent to this consultation are:   CBC    Component Value Date/Time   WBC 5.6 05/14/2019 0139   RBC 3.59 (L) 05/14/2019 0139   HGB 7.7 (L) 05/14/2019 0139   HGB 12.0 02/05/2019 0836   HCT 26.8 (L) 05/14/2019 0139   HCT 38.5 02/05/2019 0836   PLT  197 05/14/2019 0139   PLT 212 02/05/2019 0836   MCV 74.7 (L) 05/14/2019 0139   MCV 81 02/05/2019 0836   MCH 21.4 (L) 05/14/2019 0139   MCHC 28.7 (L) 05/14/2019 0139   RDW 18.5 (H) 05/14/2019 0139   RDW 17.3 (H) 02/05/2019 0836   LYMPHSABS 1,702 07/13/2015 1135   MONOABS 888 07/13/2015 1135   EOSABS 148 07/13/2015 1135   BASOSABS 0 07/13/2015 1135    CMP     Component Value Date/Time   NA 139 05/14/2019 0139   NA 140 02/05/2019 0836   K 4.6 05/14/2019 0139   CL 108 05/14/2019 0139   CO2 24 05/14/2019 0139   GLUCOSE 102 (H) 05/14/2019 0139   BUN 22 05/14/2019 0139   BUN 12 02/05/2019 0836   CREATININE 1.76 (H) 05/14/2019 0139   CREATININE 1.31 (H) 05/07/2013 0947   CALCIUM 8.6 (L) 05/14/2019 0139   PROT 7.4 05/11/2019 1721   PROT 7.1 02/05/2019 0836   ALBUMIN 3.3 (L) 05/11/2019 1721   ALBUMIN 4.0 02/05/2019 0836   AST 26 05/11/2019 1721   ALT 19 05/11/2019 1721   ALKPHOS 67 05/11/2019 1721   BILITOT 0.4 05/11/2019 1721   BILITOT 0.8 02/05/2019 0836   GFRNONAA 28 (L) 05/14/2019 0139   GFRAA 33 (L) 05/14/2019 0139    Lipid Panel     Component Value Date/Time   CHOL 111 02/05/2019 0836   TRIG 69 02/05/2019 0836   HDL 57 02/05/2019 0836   CHOLHDL 1.9 02/05/2019 0836   CHOLHDL 3.2 Ratio 06/13/2010 2254   VLDL 20 06/13/2010 2254   LDLCALC 39 02/05/2019 0836     Imaging I have reviewed the images obtained:  CT-scan of the brain-stable CT appearance of chronic small vessel disease from the recent CT on 05/11/2019.  Stable small right frontal convexity meningioma without evidence of associated cerebral edema  Etta Quill PA-C [facility] [phone]  M-F  (9:00 am- 5:00 PM)  05/14/2019, 2:18 PM   Assessment:  This 74 year old female who was initially brought to hospital for dizziness and syncope and found to have significant low hemoglobin.  While in the hospital patient was noted today to have sudden onset of dysarthria and decreased sensation on the  right arm and leg.  Code stroke was initiated.  Patient was not a TPA candidate secondary to recent GI bleeding Due to NIH stroke scale of 3 and VAN score of 0  she was not interventional radiology candidate.  Currently exam shows decreased sensation on the right with mild dysarthria.  Impression: -Possible acute infarction  Recommend -MRI of the brain without contrast -MRA Head and neck  -Transthoracic Echo,  -At this point continue to hold aspirin and Plavix until GI is cleared patient to have antiplatelets -Start or continue Atorvastatin 80 mg/other high intensity statin -BP goal: permissive HTN upto 185/110 mmHg -HBAIC and Lipid profile -Telemetry monitoring -Frequent neuro checks -NPO until passes stroke swallow screen -PT/OT # please page stroke NP  Or  PA  Or MD from 8am -4 pm  as this patient from this time will be  followed by the stroke.   You can look them up on [email or web address]  Password [email or web address]  NEUROHOSPITALIST ADDENDUM Performed a face to face diagnostic evaluation.   I have reviewed the contents of history and physical exam as documented by PA/ARNP/Resident and agree with above documentation.  I have discussed and formulated the above plan as documented. Edits to the note have been made as needed.   Code stroke was called for stuttering speech, dysarthria, right-sided numbness and right facial droop.  Last known normal around 10 AM.  Patient brought down for CT scan, negative for hemorrhage.  On exam patient no longer has facial droop, minimal dysarthria.  No aphasia.  Bruit sensation to light touch in the right face arm and leg Not a tPA candidate due to recent GI bleed and mild symptoms. Clinically not LVO.  Plan Close neurochecks q2h, if worsens Neuro MD needs to be notified stat MRI brain and MRA head and neck Recommendations as above  Stroke team to follow     Karena Addison Eulene Pekar MD [facility] [phone]   If 7pm to 7am, please call on call as listed  on AMION.

## 2019-05-14 NOTE — Progress Notes (Signed)
patient having Loletha Grayer cardia episodes. MD made aware. Noted new parameters for blood pressure meds.

## 2019-05-14 NOTE — Progress Notes (Addendum)
Progress Note    Kayla Mcclure  CWC:[id number] DOB: [birth year]  DOA: 05/11/2019 PCP: Lois Huxley, PA    Brief Narrative:   Chief complaint: light headedness/shortness of breath/syncope 05/05/19  Medical records reviewed and are as summarized below:  Kayla Mcclure is a delightful 74 y.o. female with a past medical history significant for CAD status post stents, chronic kidney disease stage II, chronic diastolic heart failure, history of GI bleeding attributed to small bowel AVM presented to the emergency department complaints of lightheadedness syncopal event 1 week prior and low hemoglobin per outpatient work-up.  Admitted for symptomatic anemia likely GI bleed in the setting of Plavix and aspirin.  Evaluated by gastroenterology who recommend Enteroscopy 05/15/19.  Assessment/Plan:   Principal Problem:   Symptomatic anemia Active Problems:   CAD S/P percutaneous coronary angioplasty -- Promus DES 2.5 mm x 20 mm postdilated to 3 mm   Syncope   Essential hypertension, benign   Acute renal failure superimposed on stage 2 chronic kidney disease (HCC)   Chronic diastolic CHF (congestive heart failure) (HCC)   Asthma   GI bleed   Chronic diastolic heart failure ([place])   #1. Symptomatic anemia.  Hg 7.7  this morning down from 8.0. no BM's or BRBPR.  Chart review indicates hemoglobin 4 months ago was 12.0.  Medications include aspirin and Plavix.  FOBT negative.  History of GI bleed 2017.  Recent ferritin 5 and TIBC 482 with saturation ratio 9.  Home medications include Fergon.  Evaluated by gastroenterology who opined likely intermittent low-grade GI tract blood loss.  Recommending  enteroscopy: scheduled for tomorrow -NPO past midnight -Continue to hold aspirin and Plavix -CBC in am -Consider transfusion if hemoglobin drops to 7 or less   #2.  Acute kidney injury superimposed on chronic kidney disease stage II.  Creatinine 1.32 on admission and trending up somewhat this  am.  -Monitor urine output -Gentle IV fluids -Hold nephrotoxins -Recheck in the morning   #3.  CAD.  Status post stents no chest pain. -Holding aspirin and Plavix   #4.  Chronic diastolic heart failure.  Appears compensated. Weight stable.  Home medications include Lasix, hydrochlorothiazide, Imdur, metoprolol -Holding diuretics -intake and output -daily weight      Family Communication/Anticipated D/C date and plan/Code Status   DVT prophylaxis: Lovenox ordered. Code Status: Full Code.  Family Communication: patient at bedside Disposition Plan: home hopefully 2 days (await enteroscopy)   Medical Consultants:   [facility]   Anti-Infectives:   None  Subjective:   Awake alert reports sleeping well last night.  Denies any pain/discomfort.  Denies any nausea vomiting.  Denies any recent bowel movements.  Objective:    Vitals:   05/13/19 1759 05/13/19 2122 05/13/19 2317 05/14/19 0454  BP: (!) 107/48 (!) 103/57 (!) 101/53 (!) 114/51  Pulse: 83 63 66 63  Resp: 18  18 18   Temp: 98.4 F (36.9 C)  98.3 F (36.8 C) 98 F (36.7 C)  TempSrc: Oral  Oral Oral  SpO2: 91%  100% 100%  Weight:    59.9 kg  Height:       No intake or output data in the 24 hours ending 05/14/19 1005 Filed Weights   05/12/19 7616 05/13/19 0633 05/14/19 0454  Weight: 60.6 kg 59.5 kg 59.9 kg    Exam: General: Well-nourished in bed watching TV no acute distress CV: Regular rate and rhythm no murmur gallop or rub no lower extremity edema Respiratory: No increased  work of breathing.  Breath sounds with good air movement.  I hear no crackles no wheezes no rhonchi Abdomen: Nondistended soft positive bowel sounds throughout nontender to palpation no guarding or rebounding Musculoskeletal joints without swelling/erythema moves all extremities spontaneously full range of motion Neuro: Alert and oriented x3 speech clear facial symmetry cranial nerves II through XII grossly intact  Data  Reviewed:   I have personally reviewed following labs and imaging studies:  Labs: Labs show the following:   Basic Metabolic Panel: Recent Labs  Lab 05/11/19 1721 05/11/19 1721 05/12/19 0613 05/12/19 0613 05/13/19 0259 05/14/19 0139  NA 138  --  140  --  138 139  K 3.7   < > 4.0   < > 4.7 4.6  CL 105  --  110  --  109 108  CO2 22  --  21*  --  24 24  GLUCOSE 98  --  81  --  87 102*  BUN 25*  --  19  --  16 22  CREATININE 1.32*  --  1.13*  --  1.25* 1.76*  CALCIUM 9.0  --  8.8*  --  8.8* 8.6*   < > = values in this interval not displayed.   GFR Estimated Creatinine Clearance: 22.8 mL/min (A) (by C-G formula based on SCr of 1.76 mg/dL (H)). Liver Function Tests: Recent Labs  Lab 05/11/19 1721  AST 26  ALT 19  ALKPHOS 67  BILITOT 0.4  PROT 7.4  ALBUMIN 3.3*   No results for input(s): LIPASE, AMYLASE in the last 168 hours. No results for input(s): AMMONIA in the last 168 hours. Coagulation profile No results for input(s): INR, PROTIME in the last 168 hours.  CBC: Recent Labs  Lab 05/11/19 1721 05/12/19 0613 05/12/19 1142 05/12/19 1857 05/13/19 0259 05/13/19 1028 05/14/19 0139  WBC 6.8  --   --   --   --   --  5.6  HGB 8.5*   < > 7.9* 7.7* 7.7* 8.0* 7.7*  HCT 30.0*   < > 26.6* 26.3* 26.3* 27.7* 26.8*  MCV 75.8*  --   --   --   --   --  74.7*  PLT 222  --   --   --   --   --  197   < > = values in this interval not displayed.   Cardiac Enzymes: No results for input(s): CKTOTAL, CKMB, CKMBINDEX, TROPONINI in the last 168 hours. BNP (last 3 results) No results for input(s): PROBNP in the last 8760 hours. CBG: No results for input(s): GLUCAP in the last 168 hours. D-Dimer: No results for input(s): DDIMER in the last 72 hours. Hgb A1c: No results for input(s): HGBA1C in the last 72 hours. Lipid Profile: No results for input(s): CHOL, HDL, LDLCALC, TRIG, CHOLHDL, LDLDIRECT in the last 72 hours. Thyroid function studies: No results for input(s): TSH,  T4TOTAL, T3FREE, THYROIDAB in the last 72 hours.  Invalid input(s): FREET3 Anemia work up: Recent Labs    05/11/19 2314  VITAMINB12 289  FOLATE 11.9  FERRITIN 5*  TIBC 482*  IRON 42  RETICCTPCT 0.9   Sepsis Labs: Recent Labs  Lab 05/11/19 1721 05/14/19 0139  WBC 6.8 5.6    Microbiology Recent Results (from the past 240 hour(s))  SARS CORONAVIRUS 2 (TAT 6-24 HRS) Nasopharyngeal Nasopharyngeal Swab     Status: None   Collection Time: 05/12/19 12:58 AM   Specimen: Nasopharyngeal Swab  Result Value Ref Range Status  SARS Coronavirus 2 NEGATIVE NEGATIVE Final    Comment: (NOTE) SARS-CoV-2 target nucleic acids are NOT DETECTED. The SARS-CoV-2 RNA is generally detectable in upper and lower respiratory specimens during the acute phase of infection. Negative results do not preclude SARS-CoV-2 infection, do not rule out co-infections with other pathogens, and should not be used as the sole basis for treatment or other patient management decisions. Negative results must be combined with clinical observations, patient history, and epidemiological information. The expected result is Negative. Fact Sheet for Patients: SugarRoll.be Fact Sheet for Healthcare Providers: https://www.woods-mathews.com/ This test is not yet approved or cleared by the [place] FDA and  has been authorized for detection and/or diagnosis of SARS-CoV-2 by FDA under an Emergency Use Authorization (EUA). This EUA will remain  in effect (meaning this test can be used) for the duration of the COVID-19 declaration under Section 56 4(b)(1) of the Act, 21 U.S.C. section 360bbb-3(b)(1), unless the authorization is terminated or revoked sooner. Performed at [facility], [place] [address], [place], [place] [address]     Procedures and diagnostic studies:  No results found.  Medications:    amLODipine  5 mg Oral Daily   atorvastatin  80 mg Oral q1800    ezetimibe  10 mg Oral Daily   ferrous gluconate  324 mg Oral BID WC   fluticasone furoate-vilanterol  1 puff Inhalation Daily   gabapentin  300 mg Oral BID   isosorbide mononitrate  90 mg Oral Daily   metoprolol succinate  150 mg Oral QHS   pantoprazole (PROTONIX) IV  40 mg Intravenous Q12H   sodium chloride flush  3 mL Intravenous Q12H   Continuous Infusions:  sodium chloride 50 mL/hr at 05/14/19 0904     LOS: 1 day   [facility]  [facility]   How to contact the [facility] Attending or Consulting provider [place] or covering provider during after hours [place], for this patient?  Check the care team in [facility] and look for a) attending/consulting TR[facility] provider listed and b) the [facility] team listed Log into [email or web address] and use [place]'s universal password to access. If you do not have the password, please contact the hospital operator. Locate the [facility] provider you are looking for under [facility] and page to a number that you can be directly reached. If you still have difficulty reaching the provider, please page the [facility] (Director on Call) for the Hospitalists listed on amion for assistance.  05/14/2019, 10:05 AM

## 2019-05-14 NOTE — Progress Notes (Signed)
Pt states she does not take Gabapentin at home after HS admin. Pt has  On PTA and has been receiving since admission. [place] informed.

## 2019-05-14 NOTE — Plan of Care (Signed)

## 2019-05-14 NOTE — Significant Event (Addendum)
Rapid Response Event Note  Overview: Time Called: 2620 Arrival Time: 1335 Event Type: Neurologic  Pt had phone call with her husband between 10:00am-11:00am and noticed a change in her speech. Pt's daughter called her around 13:00 and also noticed a change in Kayla Mcclure's voice, daughter called primary RN. Rapid response called by primary RN for change in speech.   Initial Focused Assessment: Pt awake, alert, oriented lying in bed. Noted to have decreased sensation to her right face, right arm, and right leg. Pt is mildly dysarthric, speech is stuttering at times (I spoke to pt's daughter over the phone. Per pt's daughter her speech is not like this at baseline). Minor right facial paralysis. NIH 3. CBG 89. BP 109/60 (74), HR 52, SpO2 94% on room air. Breathing regular and unlabored. Pt is tearful and concerned regarding her change in speech.   Interventions: -Code stroke activated at 1350. See stroke flowsheet documentation.  -Dr. Eliseo Squires at bedside to assess pt. at approximately 1355. -Pt taken down to CT.  Plan of Care (if not transferred): -Q2H neuro checks  Called and updated Dr. Eliseo Squires on pt's return from [place] and recommendations from neurology. New orders placed. Order to transfer to PCU.   Event Summary: Name of Physician Notified: Dr. Eliseo Squires at 1350    at    Outcome: Transferred (Comment)(Transfer to PCU.)  Event End Time: [place]

## 2019-05-15 ENCOUNTER — Encounter (HOSPITAL_COMMUNITY): Payer: Self-pay | Admitting: Internal Medicine

## 2019-05-15 ENCOUNTER — Encounter (HOSPITAL_COMMUNITY)
Admission: EM | Disposition: A | Discharge status: Home / Self Care | Payer: Self-pay | Source: Home / Self Care | Attending: Internal Medicine

## 2019-05-15 ENCOUNTER — Inpatient Hospital Stay (HOSPITAL_COMMUNITY): Payer: Medicare Other | Admitting: Certified Registered Nurse Anesthetist

## 2019-05-15 ENCOUNTER — Inpatient Hospital Stay (HOSPITAL_COMMUNITY): Payer: Medicare Other

## 2019-05-15 DIAGNOSIS — I10 Essential (primary) hypertension: Secondary | ICD-10-CM

## 2019-05-15 DIAGNOSIS — K922 Gastrointestinal hemorrhage, unspecified: Principal | ICD-10-CM

## 2019-05-15 DIAGNOSIS — R55 Syncope and collapse: Secondary | ICD-10-CM

## 2019-05-15 HISTORY — PX: ENTEROSCOPY: SHX5533

## 2019-05-15 LAB — TYPE AND SCREEN
ABO/RH(D): A POS
Antibody Screen: NEGATIVE
Unit division: 0
Unit division: 0

## 2019-05-15 LAB — BPAM RBC
Blood Product Expiration Date: 202103062359
Blood Product Expiration Date: 202103112359
Unit Type and Rh: 5100
Unit Type and Rh: 6200

## 2019-05-15 LAB — LIPID PANEL
Cholesterol: 91 mg/dL (ref 0–200)
HDL: 45 mg/dL (ref >40)
LDL Cholesterol: 35 mg/dL (ref 0–99)
Total CHOL/HDL Ratio: 2 RATIO
Triglycerides: 55 mg/dL (ref <150)
VLDL: 11 mg/dL (ref 0–40)

## 2019-05-15 LAB — BASIC METABOLIC PANEL
Anion gap: 7 (ref 5–15)
BUN: 16 mg/dL (ref 8–23)
CO2: 24 mmol/L (ref 22–32)
Calcium: 8.6 mg/dL — ABNORMAL LOW (ref 8.9–10.3)
Chloride: 110 mmol/L (ref 98–111)
Creatinine, Ser: 1.36 mg/dL — ABNORMAL HIGH (ref 0.44–1.00)
GFR calc Af Amer: 45 mL/min — ABNORMAL LOW (ref >60)
GFR calc non Af Amer: 39 mL/min — ABNORMAL LOW (ref >60)
Glucose, Bld: 88 mg/dL (ref 70–99)
Potassium: 4.5 mmol/L (ref 3.5–5.1)
Sodium: 141 mmol/L (ref 135–145)

## 2019-05-15 LAB — CBC
HCT: 26.9 % — ABNORMAL LOW (ref 36.0–46.0)
Hemoglobin: 7.7 g/dL — ABNORMAL LOW (ref 12.0–15.0)
MCH: 21.6 pg — ABNORMAL LOW (ref 26.0–34.0)
MCHC: 28.6 g/dL — ABNORMAL LOW (ref 30.0–36.0)
MCV: 75.4 fL — ABNORMAL LOW (ref 80.0–100.0)
Platelets: 197 10*3/uL (ref 150–400)
RBC: 3.57 MIL/uL — ABNORMAL LOW (ref 3.87–5.11)
RDW: 18.6 % — ABNORMAL HIGH (ref 11.5–15.5)
WBC: 6.1 10*3/uL (ref 4.0–10.5)
nRBC: 0 % (ref 0.0–0.2)

## 2019-05-15 LAB — HEMOGLOBIN A1C
Hgb A1c MFr Bld: 6.4 % — ABNORMAL HIGH (ref 4.8–5.6)
Mean Plasma Glucose: 136.98 mg/dL

## 2019-05-15 SURGERY — ENTEROSCOPY
Anesthesia: Monitor Anesthesia Care

## 2019-05-15 MED ORDER — CLOPIDOGREL BISULFATE 75 MG PO TABS
75.0000 mg | ORAL_TABLET | Freq: Every day | ORAL | Status: DC
  Administered 2019-05-15 – 2019-05-16 (×2): 75 mg via ORAL
  Filled 2019-05-15 (×2): qty 1

## 2019-05-15 MED ORDER — LIDOCAINE 2% (20 MG/ML) 5 ML SYRINGE
INTRAMUSCULAR | Status: DC | PRN
  Administered 2019-05-15: 100 mg via INTRAVENOUS

## 2019-05-15 MED ORDER — PROPOFOL 500 MG/50ML IV EMUL
INTRAVENOUS | Status: DC | PRN
  Administered 2019-05-15: 75 ug/kg/min via INTRAVENOUS

## 2019-05-15 MED ORDER — IOHEXOL 350 MG/ML SOLN
75.0000 mL | Freq: Once | INTRAVENOUS | Status: AC | PRN
  Administered 2019-05-15: 75 mL via INTRAVENOUS

## 2019-05-15 MED ORDER — SODIUM CHLORIDE 0.9 % IV SOLN
INTRAVENOUS | Status: AC | PRN
  Administered 2019-05-15: 1000 mL via INTRAVENOUS

## 2019-05-15 MED ORDER — ASPIRIN EC 81 MG PO TBEC
81.0000 mg | DELAYED_RELEASE_TABLET | Freq: Every day | ORAL | Status: DC
  Administered 2019-05-15 – 2019-05-16 (×2): 81 mg via ORAL
  Filled 2019-05-15 (×2): qty 1

## 2019-05-15 MED ORDER — PROPOFOL 10 MG/ML IV BOLUS
INTRAVENOUS | Status: DC | PRN
  Administered 2019-05-15: 20 mg via INTRAVENOUS

## 2019-05-15 NOTE — Evaluation (Signed)
Speech Language Pathology Evaluation Patient Details Name: Kayla Mcclure MRN: [id number] DOB: 07-Sep-1945 Today's Date: 05/15/2019 Time: 1962-2297 SLP Time Calculation (min) (ACUTE ONLY): 32 min  Problem List:  Patient Active Problem List   Diagnosis Date Noted  . Chronic diastolic heart failure ([place])   . Acute renal failure superimposed on stage 2 chronic kidney disease ([place]) 05/12/2019  . Syncope 05/12/2019  . Chronic diastolic CHF (congestive heart failure) ([place]) 05/12/2019  . Progressive angina ([place]) 02/04/2019  . Coronary artery disease involving native coronary artery of native heart with angina pectoris ([place]) 02/04/2019  . Acute blood loss anemia   . Symptomatic anemia 12/19/2015  . DOE (dyspnea on exertion) 09/03/2015  . Orthostatic dizziness 07/13/2015  . Bilateral carotid artery disease ([place]) 07/08/2015  . Former heavy cigarette smoker (20-39 per day) 02/04/2014  . GI bleed 09/27/2013  . Anemia 09/11/2013  . Claudication ([place]) 05/04/2013  . Claudication of left lower extremity > right 03/06/2013  . Dyslipidemia, goal LDL below 70 12/23/2012  . PAD (peripheral artery disease) - bilateral CIA & CFA 50-69%; Claudication 12/22/2012  . Obesity (BMI 30-39.9) 12/22/2012  . Internal hemorrhoids 04/11/2010  . Diverticulosis 04/11/2010  . Chronic insomnia 10/07/2009  . Iron deficiency anemia 09/23/2008  . Chronic back pain 09/23/2008  . CKD, stage 2 08/15/2008  . Essential hypertension, benign 10/31/2007  . Asthma 10/31/2007  . CAD S/P percutaneous coronary angioplasty -- Promus DES 2.5 mm x 20 mm postdilated to 3 mm 08/05/2007  . History of MI (myocardial infarction)  - NSTEMI with PCI of RCA. 08/05/2007   Past Medical History:  Past Medical History:  Diagnosis Date  . Asthma  10/31/2007  . CAD (coronary artery disease) 08/05/2007   a) NSTEMI 2009: PCI to the RCA Promus DES 2.5 mm x 23 mm (3.0 mm). b) Myoview June 2017: Nonspecific ST changes. EF greater than 65%.  LOW RISK, normal study. No ischemia or infarction.  . Carotid artery disease ([place])    a. 50-69% RICA by outpt dopp 04/22/13, f/u due 04/2014.  Marland Kitchen Chronic back pain   . Chronic diastolic heart failure ([place])   . CKD (chronic kidney disease), stage II   . Diverticulosis   04/11/2010    Colonoscopy  . Dyslipidemia, goal LDL below 70   . Essential hypertension    a. Normal renal arteries by PV angio 05/2013.  Marland Kitchen GERD (gastroesophageal reflux disease)   . GI bleed 12/2015  . History of recent blood transfusion 12/2015   12-19-15, 12-20-15 and 12-21-15  . Insomnia   . Internal hemorrhoids  04/11/2010   By colonoscopy  . Iron deficiency anemia   . Myocardial infarction ([place]) 2009  . Peripheral arterial disease ([place])    a. s/p PTA and stenting of L CIA stenosis 05/2013.  Marland Kitchen Renal insufficiency   . Tobacco abuse    Quit in 05/2013   Past Surgical History:  Past Surgical History:  Procedure Laterality Date  . CHOLECYSTECTOMY  1975  . COLONOSCOPY  04/11/2010  . colonscopy  11/30/2015  . CORONARY ANGIOPLASTY WITH STENT PLACEMENT  08/05/2007   Promus DES 2.5 mm x 23 mm (3 mm)  to RCA; EF 60-70%  . ENTEROSCOPY N/A 12/27/2015   Procedure: ENTEROSCOPY;  Surgeon: Clarene Essex, MD;  Location: [facility] ENDOSCOPY;  Service: Endoscopy;  Laterality: N/A;  also needs slim egd scope  . ESOPHAGOGASTRODUODENOSCOPY N/A 09/13/2013   Procedure: ESOPHAGOGASTRODUODENOSCOPY (EGD);  Surgeon: Lear Ng, MD;  Location: [facility] ENDOSCOPY;  Service: Endoscopy;  Laterality: N/A;  . ESOPHAGOGASTRODUODENOSCOPY N/A 07/14/2015   Procedure: ESOPHAGOGASTRODUODENOSCOPY (EGD);  Surgeon: Clarene Essex, MD;  Location: [facility] ENDOSCOPY;  Service: Endoscopy;  Laterality: N/A;  . gi bleed  07/2015   blood given  . HOT HEMOSTASIS N/A 12/27/2015   Procedure: HOT HEMOSTASIS (ARGON PLASMA COAGULATION/BICAP);  Surgeon: Clarene Essex, MD;  Location: [facility] ENDOSCOPY;  Service: Endoscopy;  Laterality: N/A;  . ILIAC ARTERY STENT Left 05/04/2013   L CIA-EIA -- Dr.  Gwenlyn Found  . LEFT HEART CATH AND CORONARY ANGIOGRAPHY N/A 02/11/2019   Procedure: LEFT HEART CATH AND CORONARY ANGIOGRAPHY;  Surgeon: Leonie Man, MD;  Location: [facility] CV LAB;;-  Patent RCA stent with minimal ISR after pRCA 50%).  Small caliber RPAV with ~75%.  pLAD 30%, m-dLAD 40%, D2 40%.  Normal LVEDP and LVEF.  . LOWER EXTREMITY ANGIOGRAM Bilateral 05/04/2013   Procedure: LOWER EXTREMITY ANGIOGRAM;  Surgeon: Lorretta Harp, MD;  Location: [facility] CATH LAB;  Service: Cardiovascular;  Laterality: Bilateral;  . NM MYOVIEW LTD  09/07/2015   EF > 65%. Nonspecific ST changes but no ischemic changes. No ischemia or infarction. LOW RISK  . TONSILLECTOMY  1960's  . TRANSTHORACIC ECHOCARDIOGRAM  09/21/2015   EF 60-65%. GR 1 DD. No regional wall motion analysis. No valve disease.  Marland Kitchen UPPER GI ENDOSCOPY  12/14/2015   HPI:  Kayla Mcclure is a 74 y.o. female with medical history significant for CAD with stents, chronic kidney disease stage II, chronic diastolic CHF, history of GI bleeding attributed to small bowel AVM, now presenting to the emergency department for evaluation of lightheadedness, syncopal episode 1 week ago, and low hemoglobin on outpatient blood work.  MRI on 05/14/19 reported "No acute brain finding."    Assessment / Plan / Recommendation Clinical Impression  Pt was seen for a cognitive-linguistic evaluation in the setting of speech changes beginning yesterday afternoon.  Pt reported that her husband and granddaughter noticed speech changes when speaking on the phone with her.  She additionally stated that she noticed a stuttering-like quality to her speech when speaking continuously.  She denied stuttering as a child, but stated that her son stuttered as a child.  Pt's speech was observed to be reduced in rate (likely the pt self-regulating) and occasional halting was noted.  Pt reported that her speech sounded closer to her baseline during this evaluation, but that she had trouble  "getting her words out" earlier this morning.  Spoke with pt regarding strategies including slow rate of speech, pausing, using alternative words if she becomes stuck on a certain work, Social research officer, government.  Pt was already independently implementing some of these strategies in conversational speech.  Oral mechanism exam was unremarkable and DDK rate appeared WFL.  No dysarthria or expressive/receptive language deficits were observed.  Pt additionally completed the SLUMS Examination and she scored 26/30 indicating a Mild Neurocognitive Disorder (Normal is >/=27/30).  She exhibited mild problem solving deficits with time-based and money-based questions.  No additional cognitive deficits were observed.  Recommend additional ST targeting cognitive-linguistic deficits and supervision with IADLs (meds, finances, etc.) when pt is first discharged.  Additionally recommend home health or outpatient ST if speech changes do not resolve piror to discharge.  Spoke with pt regarding all recommendations and she verbalized understanding.  SLP will f/u per POC.      SLP Assessment  SLP Recommendation/Assessment: Patient needs continued Speech Lanaguage Pathology Services SLP Visit Diagnosis: Cognitive communication deficit (R41.841)    Follow Up Recommendations  (TBD )  Home health vs Outpatient ST if speech changes persist    Frequency and Duration min 2x/week  2 weeks      SLP Evaluation Cognition  Overall Cognitive Status: Difficult to assess Arousal/Alertness: Awake/alert Orientation Level: Oriented X4 Attention: Sustained Sustained Attention: Appears intact Memory: Appears intact Immediate Memory Recall: Sock;Blue;Bed Memory Recall Sock: Without Cue Memory Recall Blue: Without Cue Memory Recall Bed: Without Cue Awareness: Appears intact Problem Solving: Impaired Problem Solving Impairment: Functional complex Safety/Judgment: Appears intact       Comprehension  Auditory Comprehension Overall Auditory  Comprehension: Appears within functional limits for tasks assessed Yes/No Questions: Within Functional Limits Commands: Within Functional Limits Conversation: Complex    Expression Expression Primary Mode of Expression: Verbal Verbal Expression Overall Verbal Expression: Appears within functional limits for tasks assessed Initiation: No impairment Level of Generative/Spontaneous Verbalization: Conversation Repetition: No impairment Naming: No impairment Pragmatics: No impairment Written Expression Dominant Hand: Right   Oral / Motor  Oral Motor/Sensory Function Overall Oral Motor/Sensory Function: Within functional limits Motor Speech Overall Motor Speech: Impaired Respiration: Within functional limits Phonation: Normal Resonance: Within functional limits Articulation: Within functional limitis Intelligibility: Intelligible Motor Planning: Witnin functional limits Motor Speech Errors: (stuttering-like quality ) Effective Techniques: Slow rate;Pause;Over-articulate   GO                   Colin Mulders., M.S., [place] [facility] Office: [phone]  [place] 05/15/2019, 12:09 PM

## 2019-05-15 NOTE — Anesthesia Procedure Notes (Signed)
Procedure Name: MAC Date/Time: 05/15/2019 2:08 AM Performed by: Trinna Post., CRNA Pre-anesthesia Checklist: Patient identified, Emergency Drugs available, Suction available, Patient being monitored and Timeout performed Patient Re-evaluated:Patient Re-evaluated prior to induction Oxygen Delivery Method: Nasal cannula Preoxygenation: Pre-oxygenation with 100% oxygen Induction Type: IV induction Placement Confirmation: positive ETCO2

## 2019-05-15 NOTE — Transfer of Care (Signed)
Immediate Anesthesia Transfer of Care Note  Patient: Kayla Mcclure  Procedure(s) Performed: ENTEROSCOPY (N/A )  Patient Location: PACU and Endoscopy Unit  Anesthesia Type:MAC  Level of Consciousness: awake, alert  and oriented  Airway & Oxygen Therapy: Patient Spontanous Breathing and Patient connected to nasal cannula oxygen  Post-op Assessment: Report given to RN and Post -op Vital signs reviewed and stable  Post vital signs: Reviewed and stable  Last Vitals:  Vitals Value Taken Time  BP 114/46 05/15/19 1431  Temp    Pulse 71 05/15/19 1431  Resp 23 05/15/19 1431  SpO2 98 % 05/15/19 1431  Vitals shown include unvalidated device data.  Last Pain:  Vitals:   05/15/19 1311  TempSrc: Oral  PainSc: 0-No pain         Complications: No apparent anesthesia complications

## 2019-05-15 NOTE — Progress Notes (Signed)
Patient's proximal small bowel enteroscopy was completely normal other than a small hiatal hernia and Schatzki's ring.  Specifically, no source of anemia or drop in hemoglobin was identified.  Please see dictated note for details.  Recommendations:  1.  Okay for discharge at any time from the GI tract standpoint  2.  Okay to resume Plavix at any time  3.  The patient should have outpatient hemoglobin monitoring, and probably also Hemoccult monitoring (the latter, especially if her hemoglobin declines over time).  Presumably, this could be most easily accomplished through the patient's primary physician.  However, if that is not feasible, she could be referred back to our office for that purpose.  4.  If progressive anemia occurs as an outpatient, despite Hemoccult negative stool, I would consider hematology referral.  On the other hand, if the patient has progressive anemia and is Hemoccult positive, referral back to her primary gastroenterologist, Dr. Clarene Essex, would be appropriate.  5.  We will sign off.  Please call if you have any questions pertaining to this patient's management.  Cleotis Nipper, M.D. Pager [phone] If no answer or after 5 PM call [phone]

## 2019-05-15 NOTE — Anesthesia Preprocedure Evaluation (Signed)
Anesthesia Evaluation  Patient identified by MRN, date of birth, ID band Patient awake    Reviewed: Allergy & Precautions, NPO status , Patient's Chart, lab work & pertinent test results  Airway Mallampati: II  TM Distance: >3 FB Neck ROM: Full    Dental no notable dental hx.    Pulmonary shortness of breath, asthma , former smoker,    Pulmonary exam normal breath sounds clear to auscultation       Cardiovascular hypertension, + CAD, + Past MI, + Peripheral Vascular Disease and + DOE  Normal cardiovascular exam Rhythm:Regular Rate:Normal  ECHO 09-21-15: Study Conclusions  - Left ventricle: The cavity size was normal. Systolic function was   normal. The estimated ejection fraction was in the range of 60%   to 65%. Wall motion was normal; there were no regional wall   motion abnormalities. Doppler parameters are consistent with   abnormal left ventricular relaxation (grade 1 diastolic   dysfunction). Doppler parameters are consistent with elevated   ventricular end-diastolic filling pressure. - Aortic valve: There was mild regurgitation. - Mitral valve: Calcified annulus.   Neuro/Psych negative neurological ROS  negative psych ROS   GI/Hepatic Neg liver ROS, GERD  ,  Endo/Other  negative endocrine ROS  Renal/GU Renal InsufficiencyRenal diseaseCr 1.32 K 4.5  negative genitourinary   Musculoskeletal negative musculoskeletal ROS (+)   Abdominal   Peds negative pediatric ROS (+)  Hematology  (+) anemia ,   Anesthesia Other Findings   Reproductive/Obstetrics negative OB ROS                             Anesthesia Physical  Anesthesia Plan  ASA: III  Anesthesia Plan: MAC   Post-op Pain Management:    Induction: Intravenous  PONV Risk Score and Plan: 2 and Treatment may vary due to age or medical condition  Airway Management Planned: Natural Airway and Nasal Cannula  Additional  Equipment:   Intra-op Plan:   Post-operative Plan:   Informed Consent: I have reviewed the patients History and Physical, chart, labs and discussed the procedure including the risks, benefits and alternatives for the proposed anesthesia with the patient or authorized representative who has indicated his/her understanding and acceptance.     Dental advisory given  Plan Discussed with: CRNA  Anesthesia Plan Comments:         Anesthesia Quick Evaluation

## 2019-05-15 NOTE — Progress Notes (Signed)
PROGRESS NOTE  Kayla Mcclure  NOM:[id number] DOB: 1945-06-07 DOA: 05/11/2019 PCP: Lois Huxley, PA   Brief Narrative: Kayla Mcclure is a 74 y.o. female with a history of CAD s/p PCI on DAPT, stage II CKD, chronic HFpEF, and GI bleeding due to small bowel AVMs treated with APC in 2017 who presented to the ED with lightheadedness, syncopal event 1 week prior and low hemoglobin per outpatient work-up. She was found to have hgb 8.5 down from 12 with creatinine 1.32, up from 0.96 previously. She was admitted for symptomatic anemia due to blood loss from GI source. GI consulted and recommended EGD with enteroscopy after DAPT had been held for 5 days. This is planned 2/12. On 2/11 she began experiencing abnormal speech, stuttering, word finding difficulty, and right finger paresthesias. Code stroke was called. CT and subsequent MRI failed to reveal acute CVA. Speech difficulties appear to be somewhat improved with speech therapy evaluation and management, paresthesias resolved.   Assessment & Plan: Principal Problem:   Symptomatic anemia Active Problems:   Essential hypertension, benign   CAD S/P percutaneous coronary angioplasty -- Promus DES 2.5 mm x 20 mm postdilated to 3 mm   Asthma   GI bleed   Acute renal failure superimposed on stage 2 chronic kidney disease (HCC)   Syncope   Chronic diastolic CHF (congestive heart failure) (HCC)   Chronic diastolic heart failure (HCC)  Symptomatic chronic blood loss anemia/iron deficiency anemia due to intermittent low-grade GI bleeding with history of small bowel AVMs: No evidence of large acute bleeding with negative FOBTs.  - GI consult appreciated, planning enteroscopy today.  - Holding plavix, ASA. Will restart when GI recommends.  - Continue po iron. May benefit from IV iron with ferritin of 5 in setting of blood loss. I have yet to discuss risks and benefits of this with the patient, so will defer for now. - Trending H/H -  PPI  Stuttering, word blocking, paresthesias, r/o CVA: Meningioma without mass effect is stable, no CVA on CT or MRI. - MRA shows bilateral carotid artery stenoses (R > L)  - LDL 35. On statin, zetia due to CAD - HbA1c 6.4% qualifying for prediabetes. Will order carb-modified diet when cleared, recommend PCP follow up. - Echocardiogram without cardioembolic source - Stroke neurology consulted.  AKI on stage II CKD: CrCl remains elevated - Continue gentle IVF with NPO status until procedure - Continue monitoring. - Avoid nephrotoxins as able.  CAD s/p PCI: No angina.  - Holding antiplatelets for now - Continuing statin, beta blocker  Chronic HFpEF, HTN: Appears euvolemic. Echo confirms grade II diastolic dysfunction, LVEF preserved. - Holding diuretics with AKI - Monitor I/O, weights - Continue imdur, metoprolol  COPD: Presumed diagnosis for home breo ellipta.  - Continue inhaler  DVT prophylaxis: SCDs Code Status: Full Family Communication: None at bedside Disposition Plan:   Patient came from home                                                                                                  Anticipated d/c location home  Barriers to d/c: Procedure  Consultants:   GI, Dr. Cristina Gong  Procedures:   Enteroscopy planned 05/15/2019  Echocardiogram 05/14/2019: 1. Left ventricular ejection fraction, by estimation, is 60 to 65%. The  left ventricle has normal function. The left ventrical has no regional  wall motion abnormalities. Left ventricular diastolic parameters are  consistent with Grade II diastolic  dysfunction (pseudonormalization).  2. Right ventricular systolic function is normal. The right ventricular  size is normal. There is moderately elevated pulmonary artery systolic  pressure.  3. The mitral valve is normal in structure and function. trivial mitral  valve regurgitation. No evidence of mitral stenosis.  4. The aortic valve is normal in structure and  function. Aortic valve  regurgitation is trivial . No aortic stenosis is present.   Antimicrobials:  None   Subjective: Stuttering came on gradually yesterday morning worsening through until the afternoon noticed by family on phone. This continues today. Also had numbness in right distal fingers which has resolved. Denies weakness. No chest pain or other pain. No bleeding.  Objective: Vitals:   05/15/19 0200 05/15/19 0325 05/15/19 0404 05/15/19 0747  BP: (!) 119/59 129/73  119/70  Pulse: 73 83  63  Resp: 18 19  17   Temp: 98.9 F (37.2 C) 98.9 F (37.2 C)  98.8 F (37.1 C)  TempSrc: Oral Oral  Oral  SpO2: 93% 96%  96%  Weight:   62.7 kg   Height:        Intake/Output Summary (Last 24 hours) at 05/15/2019 1240 Last data filed at 05/15/2019 0309 Gross per 24 hour  Intake 950 ml  Output --  Net 950 ml   Filed Weights   05/14/19 0454 05/14/19 2029 05/15/19 0404  Weight: 59.9 kg 62.7 kg 62.7 kg    Gen: 74 y.o. female in no distress Pulm: Non-labored breathing room air. Clear to auscultation bilaterally.  CV: Regular rate and rhythm. No murmur, rub, or gallop. No JVD, no pedal edema. GI: Abdomen soft, non-tender, non-distended, with normoactive bowel sounds. No organomegaly or masses felt. Ext: Warm, no deformities Skin: No rashes, lesions or ulcers Neuro: Alert and oriented. No dysarthria, but is stuttering with occasional word blocking and slow rate. No focal neurological deficits in cranial or tested peripheral nerves. Psych: Judgement and insight appear normal. Mood & affect appropriate.   Data Reviewed: I have personally reviewed following labs and imaging studies  CBC: Recent Labs  Lab 05/11/19 1721 05/12/19 0613 05/12/19 1857 05/13/19 0259 05/13/19 1028 05/14/19 0139 05/15/19 0324  WBC 6.8  --   --   --   --  5.6 6.1  HGB 8.5*   < > 7.7* 7.7* 8.0* 7.7* 7.7*  HCT 30.0*   < > 26.3* 26.3* 27.7* 26.8* 26.9*  MCV 75.8*  --   --   --   --  74.7* 75.4*  PLT 222   --   --   --   --  197 197   < > = values in this interval not displayed.   Basic Metabolic Panel: Recent Labs  Lab 05/11/19 1721 05/12/19 0613 05/13/19 0259 05/14/19 0139 05/15/19 0324  NA 138 140 138 139 141  K 3.7 4.0 4.7 4.6 4.5  CL 105 110 109 108 110  CO2 22 21* 24 24 24   GLUCOSE 98 81 87 102* 88  BUN 25* 19 16 22 16   CREATININE 1.32* 1.13* 1.25* 1.76* 1.36*  CALCIUM 9.0 8.8* 8.8* 8.6* 8.6*   GFR: Estimated Creatinine Clearance: 30.1  mL/min (A) (by C-G formula based on SCr of 1.36 mg/dL (H)). Liver Function Tests: Recent Labs  Lab 05/11/19 1721  AST 26  ALT 19  ALKPHOS 67  BILITOT 0.4  PROT 7.4  ALBUMIN 3.3*   No results for input(s): LIPASE, AMYLASE in the last 168 hours. No results for input(s): AMMONIA in the last 168 hours. Coagulation Profile: No results for input(s): INR, PROTIME in the last 168 hours. Cardiac Enzymes: No results for input(s): CKTOTAL, CKMB, CKMBINDEX, TROPONINI in the last 168 hours. BNP (last 3 results) No results for input(s): PROBNP in the last 8760 hours. HbA1C: Recent Labs    05/15/19 0324  HGBA1C 6.4*   CBG: Recent Labs  Lab 05/14/19 1359  GLUCAP 89   Lipid Profile: Recent Labs    05/15/19 0324  CHOL 91  HDL 45  LDLCALC 35  TRIG 55  CHOLHDL 2.0   Thyroid Function Tests: No results for input(s): TSH, T4TOTAL, FREET4, T3FREE, THYROIDAB in the last 72 hours. Anemia Panel: No results for input(s): VITAMINB12, FOLATE, FERRITIN, TIBC, IRON, RETICCTPCT in the last 72 hours. Urine analysis:    Component Value Date/Time   COLORURINE lt. yellow 12/30/2007 1014   APPEARANCEUR Clear 12/30/2007 1014   LABSPEC 1.025 06/07/2009 0925   PHURINE 5.0 06/07/2009 0925   HGBUR negative 06/07/2009 0925   BILIRUBINUR negative 06/07/2009 0925   UROBILINOGEN 0.2 06/07/2009 0925   NITRITE negative 06/07/2009 0925   Recent Results (from the past 240 hour(s))  SARS CORONAVIRUS 2 (TAT 6-24 HRS) Nasopharyngeal Nasopharyngeal Swab      Status: None   Collection Time: 05/12/19 12:58 AM   Specimen: Nasopharyngeal Swab  Result Value Ref Range Status   SARS Coronavirus 2 NEGATIVE NEGATIVE Final    Comment: (NOTE) SARS-CoV-2 target nucleic acids are NOT DETECTED. The SARS-CoV-2 RNA is generally detectable in upper and lower respiratory specimens during the acute phase of infection. Negative results do not preclude SARS-CoV-2 infection, do not rule out co-infections with other pathogens, and should not be used as the sole basis for treatment or other patient management decisions. Negative results must be combined with clinical observations, patient history, and epidemiological information. The expected result is Negative. Fact Sheet for Patients: SugarRoll.be Fact Sheet for Healthcare Providers: https://www.woods-mathews.com/ This test is not yet approved or cleared by the [place] FDA and  has been authorized for detection and/or diagnosis of SARS-CoV-2 by FDA under an Emergency Use Authorization (EUA). This EUA will remain  in effect (meaning this test can be used) for the duration of the COVID-19 declaration under Section 56 4(b)(1) of the Act, 21 U.S.C. section 360bbb-3(b)(1), unless the authorization is terminated or revoked sooner. Performed at [facility], [place] [address], [place], [place] [address]       Radiology Studies: MR ANGIO NECK WO CONTRAST  Result Date: 05/14/2019 CLINICAL DATA:  Neurological deficit. Code stroke. Abnormal sensation right side of the body. EXAM: MRI HEAD WITHOUT CONTRAST MRA NECK WITHOUT CONTRAST TECHNIQUE: Multiplanar, multiecho pulse sequences of the brain and surrounding structures were obtained without intravenous contrast. Angiographic images of the neck were obtained using MRA technique without intravenous contrast. Carotid stenosis measurements (when applicable) are obtained utilizing NASCET criteria, using the distal internal  carotid diameter as the denominator. COMPARISON:  Head CT earlier same day. FINDINGS: MRI HEAD FINDINGS Brain: Diffusion imaging does not show any acute or subacute infarction. Chronic small-vessel ischemic changes affect the pons. No focal cerebellar abnormality. Moderate chronic small-vessel ischemic changes affect  the hemispheric white matter. No cortical or large vessel territory infarction. No mass lesion, hemorrhage, hydrocephalus or extra-axial collection. 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 1 cm meningioma of the right frontal convexity without significant mass-effect upon the brain, likely incidental and insignificant. Vascular: Major vessels at the base of the brain show flow. Skull and upper cervical spine: Negative Sinuses/Orbits: Diminutive maxillary sinuses. No evidence of acute/active sinus inflammation. Orbits negative. Other: None MRA NECK FINDINGS Both common carotid arteries are patent to their respective bifurcation. Detail is limited due to the lack of contrast and due to some patient motion. On the left, the internal and external carotid arteries are patent. There is atherosclerotic irregularity in the ICA bulb, with stenosis estimated at 50%. Cervical ICA patent beyond that. On the right, there is advanced atherosclerotic disease at the bifurcation region. No flow is seen in the external carotid. There are severe serial stenoses of the ICA, possibly 80% or greater. Antegrade flow does persist in the more distal cervical ICA. Both vertebral arteries show antegrade flow with the right being dominant. Vertebral artery origins are not included using this technique. IMPRESSION: No acute brain finding. Chronic small-vessel ischemic changes of the pons and cerebral hemispheric white matter. Advanced atherosclerotic disease at the carotid bifurcations, right worse than left. Serial stenoses of the right ICA bulb region, possibly 80% or  greater. No flow seen in the right external carotid. On the left, there is atherosclerotic disease in the carotid siphon region with stenosis estimated at 50%. Detail is limited by the lack of contrast administration and patient motion. Electronically Signed   By: Nelson Chimes M.D.   On: 05/14/2019 19:57   MR BRAIN WO CONTRAST  Result Date: 05/14/2019 CLINICAL DATA:  Neurological deficit. Code stroke. Abnormal sensation right side of the body. EXAM: MRI HEAD WITHOUT CONTRAST MRA NECK WITHOUT CONTRAST TECHNIQUE: Multiplanar, multiecho pulse sequences of the brain and surrounding structures were obtained without intravenous contrast. Angiographic images of the neck were obtained using MRA technique without intravenous contrast. Carotid stenosis measurements (when applicable) are obtained utilizing NASCET criteria, using the distal internal carotid diameter as the denominator. COMPARISON:  Head CT earlier same day. FINDINGS: MRI HEAD FINDINGS Brain: Diffusion imaging does not show any acute or subacute infarction. Chronic small-vessel ischemic changes affect the pons. No focal cerebellar abnormality. Moderate chronic small-vessel ischemic changes affect the hemispheric white matter. No cortical or large vessel territory infarction. No mass lesion, hemorrhage, hydrocephalus or extra-axial collection. 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 1 cm meningioma of the right frontal convexity without significant mass-effect upon the brain, likely incidental and insignificant. Vascular: Major vessels at the base of the brain show flow. Skull and upper cervical spine: Negative Sinuses/Orbits: Diminutive maxillary sinuses. No evidence of acute/active sinus inflammation. Orbits negative. Other: None MRA NECK FINDINGS Both common carotid arteries are patent to their respective bifurcation. Detail is limited due to the lack of contrast and due to some patient motion.  On the left, the internal and external carotid arteries are patent. There is atherosclerotic irregularity in the ICA bulb, with stenosis estimated at 50%. Cervical ICA patent beyond that. On the right, there is advanced atherosclerotic disease at the bifurcation region. No flow is seen in the external carotid. There are severe serial stenoses of the ICA, possibly 80% or greater. Antegrade flow does persist in the more distal cervical ICA. Both vertebral arteries  show antegrade flow with the right being dominant. Vertebral artery origins are not included using this technique. IMPRESSION: No acute brain finding. Chronic small-vessel ischemic changes of the pons and cerebral hemispheric white matter. Advanced atherosclerotic disease at the carotid bifurcations, right worse than left. Serial stenoses of the right ICA bulb region, possibly 80% or greater. No flow seen in the right external carotid. On the left, there is atherosclerotic disease in the carotid siphon region with stenosis estimated at 50%. Detail is limited by the lack of contrast administration and patient motion. Electronically Signed   By: Nelson Chimes M.D.   On: 05/14/2019 19:57   ECHOCARDIOGRAM COMPLETE  Result Date: 05/14/2019    ECHOCARDIOGRAM REPORT   Patient Name:   SHEILLA MARIS Date of Exam: 05/14/2019 Medical Rec #:  [id number]             Height:       59.0 in Accession #:    [id number]            Weight:       132.1 lb Date of Birth:  07/10/1945              BSA:          1.55 m Patient Age:    25 years              BP:           111/51 mmHg Patient Gender: F                     HR:           65 bpm. Exam Location:  Inpatient Procedure: 2D Echo Indications:    Stroke I163.9  History:        Patient has prior history of Echocardiogram examinations, most                 recent 09/21/2015. CAD and Previous Myocardial Infarction; Risk                 Factors:Dyslipidemia.  Sonographer:    Mikki Santee RDCS (AE) Referring Phys: [place]  1. Left ventricular ejection fraction, by estimation, is 60 to 65%. The left ventricle has normal function. The left ventrical has no regional wall motion abnormalities. Left ventricular diastolic parameters are consistent with Grade II diastolic dysfunction (pseudonormalization).  2. Right ventricular systolic function is normal. The right ventricular size is normal. There is moderately elevated pulmonary artery systolic pressure.  3. The mitral valve is normal in structure and function. trivial mitral valve regurgitation. No evidence of mitral stenosis.  4. The aortic valve is normal in structure and function. Aortic valve regurgitation is trivial . No aortic stenosis is present. FINDINGS  Left Ventricle: Left ventricular ejection fraction, by estimation, is 60 to 65%. The left ventricle has normal function. The left ventricle has no regional wall motion abnormalities. There is no left ventricular hypertrophy. Left ventricular diastolic parameters are consistent with Grade II diastolic dysfunction (pseudonormalization). Right Ventricle: The right ventricular size is normal. No increase in right ventricular wall thickness. Right ventricular systolic function is normal. There is moderately elevated pulmonary artery systolic pressure. The tricuspid regurgitant velocity is 3.42 m/s, and with an assumed right atrial pressure of 8 mmHg, the estimated right ventricular systolic pressure is 33.3 mmHg. Left Atrium: Left atrial size was normal in size. Right Atrium: Right atrial size was normal in size. Pericardium: There is no  evidence of pericardial effusion. Mitral Valve: The mitral valve is normal in structure and function. Trivial mitral valve regurgitation. No evidence of mitral valve stenosis. Tricuspid Valve: The tricuspid valve is grossly normal. Tricuspid valve regurgitation is trivial. No evidence of tricuspid stenosis. Aortic Valve: The aortic valve is normal in structure and  function. Aortic valve regurgitation is trivial. Aortic regurgitation PHT measures 343 msec. No aortic stenosis is present. Pulmonic Valve: The pulmonic valve was grossly normal. Pulmonic valve regurgitation is not visualized. Aorta: The aortic root, ascending aorta and aortic arch are all structurally normal, with no evidence of dilitation or obstruction. IAS/Shunts: The atrial septum is grossly normal.  LEFT VENTRICLE PLAX 2D LVIDd:         4.20 cm  Diastology LVIDs:         2.70 cm  LV e' lateral:   8.16 cm/s LV PW:         1.00 cm  LV E/e' lateral: 13.1 LV IVS:        0.90 cm  LV e' medial:    6.53 cm/s LVOT diam:     1.80 cm  LV E/e' medial:  16.4 LV SV:         75.32 ml LV SV Index:   32.28 LVOT Area:     2.54 cm  RIGHT VENTRICLE RV S prime:     10.40 cm/s TAPSE (M-mode): 2.2 cm LEFT ATRIUM             Index       RIGHT ATRIUM           Index LA diam:        2.60 cm 1.68 cm/m  RA Area:     14.20 cm LA Vol (A2C):   45.2 ml 29.23 ml/m RA Volume:   30.70 ml  19.85 ml/m LA Vol (A4C):   40.5 ml 26.19 ml/m LA Biplane Vol: 45.0 ml 29.10 ml/m  AORTIC VALVE LVOT Vmax:   120.00 cm/s LVOT Vmean:  74.000 cm/s LVOT VTI:    0.296 m AI PHT:      343 msec  AORTA Ao Root diam: 2.60 cm MITRAL VALVE                         TRICUSPID VALVE MV Area (PHT): 3.27 cm              TR Peak grad:   46.8 mmHg MV Decel Time: 232 msec              TR Vmax:        342.00 cm/s MV E velocity: 107.00 cm/s 103 cm/s MV A velocity: 112.00 cm/s 70.3 cm/s SHUNTS MV E/A ratio:  0.96        1.5       Systemic VTI:  0.30 m                                      Systemic Diam: 1.80 cm Mertie Moores MD Electronically signed by Mertie Moores MD Signature Date/Time: 05/14/2019/4:47:07 PM    Final    CT HEAD CODE STROKE WO CONTRAST  Result Date: 05/14/2019 CLINICAL DATA:  Code stroke. 74 year old female with decreased sensation on the right side. EXAM: CT HEAD WITHOUT CONTRAST TECHNIQUE: Contiguous axial images were obtained from the base of the  skull through the vertex without intravenous contrast. COMPARISON:  Head CT 05/11/2019. FINDINGS: Brain: Again noted evidence of a small right frontal convexity meningioma, stable and approximately 8 mm with overlying hyperostosis (series 3, image 21). No associated frontal lobe edema. No midline shift or new intracranial mass effect. No acute intracranial hemorrhage identified. Normal cerebral volume for age. Patchy bilateral cerebral white matter and some deep gray matter hypodensity is stable from the recent comparison. No cortically based acute infarct identified. Vascular: Calcified atherosclerosis at the skull base. No suspicious intracranial vascular hyperdensity. Skull: No acute osseous abnormality identified. Sinuses/Orbits: Chronic paranasal sinus periosteal thickening. Visualized paranasal sinuses and mastoids are stable and well pneumatized. Other: No acute orbit or scalp soft tissue findings. ASPECTS [facility] Stroke Program Early CT Score) Total score (0-10 with 10 being normal): 10 IMPRESSION: 1. Stable CT appearance of chronic small vessel disease from the recent 05/11/2019 comparison. No acute cortically based infarct or intracranial hemorrhage identified. ASPECTS 10. 2. Stable small right frontal convexity meningioma without evidence of associated cerebral edema. 3. These results were communicated to Dr. Lorraine Lax or at 2:19 pmon 2/11/2021by text page via the [facility] messaging system. Electronically Signed   By: Genevie Ann M.D.   On: 05/14/2019 14:20    Scheduled Meds:  atorvastatin  80 mg Oral q1800   ezetimibe  10 mg Oral Daily   ferrous gluconate  324 mg Oral BID WC   fluticasone furoate-vilanterol  1 puff Inhalation Daily   gabapentin  300 mg Oral BID   isosorbide mononitrate  90 mg Oral Daily   metoprolol succinate  50 mg Oral QHS   pantoprazole (PROTONIX) IV  40 mg Intravenous Q12H   sodium chloride flush  3 mL Intravenous Q12H   Continuous Infusions:   LOS: 2 days   Time  spent: 25 minutes.  Patrecia Pour, MD [facility] [email or web address] 05/15/2019, 12:40 PM

## 2019-05-15 NOTE — Evaluation (Signed)
Physical Therapy Evaluation Patient Details Name: Kayla Mcclure MRN: [id number] DOB: 12/12/1945 Today's Date: 05/15/2019   History of Present Illness  Kayla Mcclure is a 74 y.o. female with history of tobacco abuse, GI bleed, essential hypertension, dyslipidemia, chronic kidney disease, carotid artery disease who presents to hospital with low hemoglobin. Code stroke on 2/11 due to decreased right sided sensation and speech difficulty. MRI negative for acute brain finding. MRA with 80% R ICA stenosis.   Clinical Impression  Prior to admission, pt lives with her spouse and is independent. Pt reporting mild speech difficulty and right upper extremity numbness, although intact to light touch. No other focal deficits noted on examination; pt ambulating hallway distances and negotiated steps without difficulty. Denies dizziness/lightheadedness. No further acute PT needs; PT signing off. Thank you for this consult.    Follow Up Recommendations No PT follow up    Equipment Recommendations  None recommended by PT    Recommendations for Other Services       Precautions / Restrictions Precautions Precautions: Fall Precaution Comments: Fall risk due to syncope Restrictions Weight Bearing Restrictions: No      Mobility  Bed Mobility Overal bed mobility: Independent                Transfers Overall transfer level: Independent Equipment used: None                Ambulation/Gait Ambulation/Gait assistance: Modified independent (Device/Increase time) Gait Distance (Feet): 500 Feet Assistive device: None Gait Pattern/deviations: Step-through pattern;Decreased stride length Gait velocity: decreased   General Gait Details: Slightly decreased gait speed, pt reporting back pain with increased distance (chronic). No gross unsteadiness  Stairs Stairs: Yes Stairs assistance: Modified independent (Device/Increase time) Stair Management: No rails Number of Stairs:  2 General stair comments: Step over step ascending, step by step descending  Wheelchair Mobility    Modified Rankin (Stroke Patients Only) Modified Rankin (Stroke Patients Only) Pre-Morbid Rankin Score: No symptoms Modified Rankin: No significant disability     Balance Overall balance assessment: No apparent balance deficits (not formally assessed)                                           Pertinent Vitals/Pain Pain Assessment: No/denies pain Faces Pain Scale: Hurts a little bit Pain Location: RUE Pain Descriptors / Indicators: Sore Pain Intervention(s): Monitored during session    Home Living Family/patient expects to be discharged to:: Private residence Living Arrangements: Spouse/significant other Available Help at Discharge: Family Type of Home: House Home Access: Stairs to enter   Technical brewer of Steps: 2 Home Layout: One level Home Equipment: Shower seat      Prior Function Level of Independence: Independent         Comments: Reports one fall as a result of syncope this week     Hand Dominance   Dominant Hand: Right    Extremity/Trunk Assessment   Upper Extremity Assessment Upper Extremity Assessment: Defer to OT evaluation    Lower Extremity Assessment Lower Extremity Assessment: RLE deficits/detail;LLE deficits/detail RLE Deficits / Details: Strength 5/5 RLE Sensation: WNL LLE Deficits / Details: Strength 5/5 LLE Sensation: WNL    Cervical / Trunk Assessment Cervical / Trunk Assessment: Normal  Communication   Communication: No difficulties  Cognition Arousal/Alertness: Awake/alert Behavior During Therapy: WFL for tasks assessed/performed Overall Cognitive Status: Difficult to assess  General Comments      Exercises     Assessment/Plan    PT Assessment Patent does not need any further PT services  PT Problem List Decreased strength;Decreased  activity tolerance;Decreased balance;Decreased mobility       PT Treatment Interventions      PT Goals (Current goals can be found in the Care Plan section)  Acute Rehab PT Goals Patient Stated Goal: walk more PT Goal Formulation: All assessment and education complete, DC therapy    Frequency     Barriers to discharge        Co-evaluation               AM-PAC PT "6 Clicks" Mobility  Outcome Measure Help needed turning from your back to your side while in a flat bed without using bedrails?: None Help needed moving from lying on your back to sitting on the side of a flat bed without using bedrails?: None Help needed moving to and from a bed to a chair (including a wheelchair)?: None Help needed standing up from a chair using your arms (e.g., wheelchair or bedside chair)?: None Help needed to walk in hospital room?: None Help needed climbing 3-5 steps with a railing? : None 6 Click Score: 24    End of Session Equipment Utilized During Treatment: Gait belt Activity Tolerance: Patient tolerated treatment well Patient left: with call bell/phone within reach;in chair;with chair alarm set Nurse Communication: Mobility status PT Visit Diagnosis: Difficulty in walking, not elsewhere classified (R26.2)    Time: 7829-5621 PT Time Calculation (min) (ACUTE ONLY): 21 min   Charges:   PT Evaluation $PT Eval Moderate Complexity: 1 Mod            Wyona Almas, PT, DPT Acute Rehabilitation Services Pager [phone] Office [phone]   Deno Etienne 05/15/2019, 1:10 PM

## 2019-05-15 NOTE — Evaluation (Addendum)
Occupational Therapy Evaluation Patient Details Name: Kayla Mcclure MRN: [id number] DOB: [birth year] Today's Date: 05/15/2019    History of Present Illness 74 y.o. female with history of tobacco abuse, GI bleed, essential hypertension, dyslipidemia, chronic kidney disease, carotid artery disease who presents to hospital with low hemoglobin. Code stroke on 2/11 due to decreased right sided sensation and speech difficulty. MRI negative for acute brain finding. MRA with 80% R ICA stenosis.    Clinical Impression   PTA, pt was living with her husband and was independent. Pt currently performing ADLs and functional mobility at supervision-independent level. Pt continues to present with speech deficits. Demonstrating WFL cognition, balance, and functional use of BUEs.  Recommend dc to home once medically stable per physician. Answered all pt questions and provided education. All acute OT needs met and will sign off.     Follow Up Recommendations  No OT follow up;Supervision - Intermittent    Equipment Recommendations  None recommended by OT    Recommendations for Other Services Speech consult     Precautions / Restrictions Precautions Precautions: Fall Precaution Comments: Fall risk due to syncope Restrictions Weight Bearing Restrictions: No      Mobility Bed Mobility Overal bed mobility: Independent                Transfers Overall transfer level: Independent Equipment used: None                  Balance Overall balance assessment: No apparent balance deficits (not formally assessed)                                         ADL either performed or assessed with clinical judgement   ADL Overall ADL's : Needs assistance/impaired                                       General ADL Comments: Pt performing ADLs and functional mobility at Supervision level. Pt performing oral care at sink, LB dressing, and tub transfer without  difficulty     Vision         Perception     Praxis      Pertinent Vitals/Pain Pain Assessment: No/denies pain     Hand Dominance Right   Extremity/Trunk Assessment Upper Extremity Assessment Upper Extremity Assessment: Overall WFL for tasks assessed   Lower Extremity Assessment Lower Extremity Assessment: Defer to PT evaluation   Cervical / Trunk Assessment Cervical / Trunk Assessment: Normal   Communication Communication Communication: No difficulties   Cognition Arousal/Alertness: Awake/alert Behavior During Therapy: WFL for tasks assessed/performed Overall Cognitive Status: Within Functional Limits for tasks assessed                                 General Comments: Pt reporting increased time for exrpessive difficulties. Pt following cues and maintaining conversation. Naming thre animals that start with F while performing functional mobility. Performing trail making task back to room. Feel pt is close to baseline cognition.   General Comments       Exercises     Shoulder Instructions      Home Living Family/patient expects to be discharged to:: Private residence Living Arrangements: Spouse/significant other Available Help at Discharge: Family Type of Home:  House Home Access: Stairs to enter Technical brewer of Steps: 2   Home Layout: One level     Bathroom Shower/Tub: Teacher, early years/pre: Standard     Home Equipment: Civil engineer, contracting      Lives With: Spouse    Prior Functioning/Environment Level of Independence: Independent        Comments: Reports one fall as a result of syncope this week        OT Problem List: Decreased activity tolerance;Decreased knowledge of precautions      OT Treatment/Interventions:      OT Goals(Current goals can be found in the care plan section) Acute Rehab OT Goals Patient Stated Goal: Get home soon OT Goal Formulation: All assessment and education complete, DC therapy   OT Frequency:     Barriers to D/C:            Co-evaluation              AM-PAC OT "6 Clicks" Daily Activity     Outcome Measure Help from another person eating meals?: None Help from another person taking care of personal grooming?: None Help from another person toileting, which includes using toliet, bedpan, or urinal?: None Help from another person bathing (including washing, rinsing, drying)?: None Help from another person to put on and taking off regular upper body clothing?: None Help from another person to put on and taking off regular lower body clothing?: None 6 Click Score: 24   End of Session Equipment Utilized During Treatment: Gait belt Nurse Communication: Mobility status  Activity Tolerance: Patient tolerated treatment well Patient left: with call bell/phone within reach;in bed  OT Visit Diagnosis: Muscle weakness (generalized) (M62.81)                Time: 1206-1220 OT Time Calculation (min): 14 min Charges:  OT General Charges $OT Visit: 1 Visit OT Evaluation $OT Eval Low Complexity: 1 Low  Kathan Kirker MSOT, OTR/L Acute Rehab Pager: [phone] Office: [place] 05/15/2019, 4:47 PM

## 2019-05-15 NOTE — Progress Notes (Signed)
Hemoglobin stable for the past several days.  It is recalled that stool has been recurrently Hemoccult negative on this admission.  Nonetheless, in view of the patient's significant anemia and drop in hemoglobin from baseline, we will proceed to enteroscopy today, with the intent of retreating any persistent vascular malformations in the proximal intestine, as were observed and coagulated about 3 years ago.  Cleotis Nipper, M.D. Pager [phone] If no answer or after 5 PM call [phone]

## 2019-05-15 NOTE — Progress Notes (Signed)
Pt went down to CT.

## 2019-05-15 NOTE — Progress Notes (Signed)
Neuro MD at bedside talking to pt and son

## 2019-05-15 NOTE — Anesthesia Postprocedure Evaluation (Signed)
Anesthesia Post Note  Patient: Shulamit Victorya Hillman  Procedure(s) Performed: ENTEROSCOPY (N/A )     Patient location during evaluation: Endoscopy Anesthesia Type: MAC Level of consciousness: awake and alert Pain management: pain level controlled Vital Signs Assessment: post-procedure vital signs reviewed and stable Respiratory status: spontaneous breathing, nonlabored ventilation and respiratory function stable Cardiovascular status: blood pressure returned to baseline and stable Postop Assessment: no apparent nausea or vomiting Anesthetic complications: no    Last Vitals:  Vitals:   05/15/19 1445 05/15/19 1450  BP: (!) 111/41 (!) 136/45  Pulse: 70 71  Resp: 19 17  Temp:    SpO2: 96% 97%    Last Pain:  Vitals:   05/15/19 1445  TempSrc:   PainSc: 0-No pain                 Lynda Rainwater

## 2019-05-15 NOTE — Progress Notes (Signed)
Carotid artery duplex completed. Preliminary results discussed with Dr. Erlinda Hong.  05/15/2019 3:45 PM Maudry Mayhew, MHA, RVT, RDCS, RDMS

## 2019-05-15 NOTE — Op Note (Signed)
[facility] Patient Name: Kayla Mcclure Procedure Date : 05/15/2019 MRN: [id number] Attending MD: Ronald Lobo , MD Date of Birth: 27-Jun-1945 CSN: [id number] Age: 74 Admit Type: Inpatient Procedure:                Small bowel enteroscopy Indications:              Anemia presumed secondary to chronic blood loss;                            patient presented with Hgb 4 gm below baseline,                            heme neg x2 in hospital, prior h/o duodenal/prox                            small bowel AVM's treated with APC 2017 Providers:                Ronald Lobo, MD, Kary Kos, RN, Jeanella Cara, RN, Elspeth Cho Tech., Technician,                            Dewitt Hoes, CRNA Referring MD:              Medicines:                Monitored Anesthesia Care Complications:            No immediate complications. Estimated Blood Loss:     Estimated blood loss: none. Procedure:                Pre-Anesthesia Assessment:                           - Prior to the procedure, a History and Physical                            was performed, and patient medications and                            allergies were reviewed. The patient's tolerance of                            previous anesthesia was also reviewed. The risks                            and benefits of the procedure and the sedation                            options and risks were discussed with the patient.                            All questions were answered, and informed consent  was obtained. Prior Anticoagulants: The patient has                            taken Plavix (clopidogrel), last dose was 7 days                            prior to procedure. ASA Grade Assessment: III - A                            patient with severe systemic disease. After                            reviewing the risks and benefits, the patient was      deemed in satisfactory condition to undergo the                            procedure.                           After obtaining informed consent, the endoscope was                            passed under direct vision. Throughout the                            procedure, the patient's blood pressure, pulse, and                            oxygen saturations were monitored continuously. The                            [id number]-PH190L ([id number] Olympus ultra slim colonoscope                            was introduced through the mouth and advanced to                            the proximal jejunum. The small bowel enteroscopy                            was accomplished without difficulty. The patient                            tolerated the procedure well. Scope In: Scope Out: Findings:      The esophagus was normal. A 3 cm hiatal hernia with Schatzki's Ring       (widely patent) was present.      The stomach was normal.      There was no evidence of significant pathology in the entire examined       duodenum.      There was no evidence of significant pathology in the proximal jejunum.      The cardia and gastric fundus were normal on retroflexion.      Specifically, no AVM's or other source of anemia or drop in hgb noted  anywhere on this exam to the proximal jejunum. Impression:               - Normal esophagus.                           - Normal stomach.                           - Normal examined duodenum.                           - The examined portion of the jejunum was normal.                           - No specimens collected. Recommendation:           - Monitor hemoglobin and hemoccult status                            periodically as outpatient. Procedure Code(s):        --- Professional ---                           [phone], Small intestinal endoscopy, enteroscopy                            beyond second portion of duodenum, not including                            ileum;  diagnostic, including collection of                            specimen(s) by brushing or washing, when performed                            (separate procedure) Diagnosis Code(s):        --- Professional ---                           D50.0, Iron deficiency anemia secondary to blood                            loss (chronic) CPT copyright 2019 [employer]. All rights reserved. The codes documented in this report are preliminary and upon coder review may  be revised to meet current compliance requirements. Ronald Lobo, MD 05/15/2019 2:35:17 PM This report has been signed electronically. Number of Addenda: 0

## 2019-05-15 NOTE — Progress Notes (Signed)
STROKE TEAM PROGRESS NOTE   INTERVAL HISTORY Pt son at bedside. Pt got out from bathroom and back to bed, walking normally. No weakness seen. Pt stated that yesterday 11am-12pm she had stuttering speech while talking to her husband. When EMS checking on her, she had right fingertip numbness and funny feeling of right leg. Pt initially talking well, then became emotional, crying and hesitancy of her speech but no aphasia or dysarthria.   Vitals:   05/15/19 0325 05/15/19 0404 05/15/19 0747 05/15/19 1311  BP: 129/73  119/70 (!) 136/46  Pulse: 83  63   Resp: 19  17 12   Temp: 98.9 F (37.2 C)  98.8 F (37.1 C) 98.9 F (37.2 C)  TempSrc: Oral  Oral Oral  SpO2: 96%  96% 98%  Weight:  62.7 kg  59.9 kg  Height:    4\' 11"  (1.499 m)    CBC:  Recent Labs  Lab 05/14/19 0139 05/15/19 0324  WBC 5.6 6.1  HGB 7.7* 7.7*  HCT 26.8* 26.9*  MCV 74.7* 75.4*  PLT 197 973    Basic Metabolic Panel:  Recent Labs  Lab 05/14/19 0139 05/15/19 0324  NA 139 141  K 4.6 4.5  CL 108 110  CO2 24 24  GLUCOSE 102* 88  BUN 22 16  CREATININE 1.76* 1.36*  CALCIUM 8.6* 8.6*   Lipid Panel:     Component Value Date/Time   CHOL 91 05/15/2019 0324   CHOL 111 02/05/2019 0836   TRIG 55 05/15/2019 0324   HDL 45 05/15/2019 0324   HDL 57 02/05/2019 0836   CHOLHDL 2.0 05/15/2019 0324   VLDL 11 05/15/2019 0324   LDLCALC 35 05/15/2019 0324   LDLCALC 39 02/05/2019 0836   HgbA1c:  Lab Results  Component Value Date   HGBA1C 6.4 (H) 05/15/2019   Urine Drug Screen: No results found for: LABOPIA, COCAINSCRNUR, LABBENZ, AMPHETMU, THCU, LABBARB  Alcohol Level No results found for: ETH  IMAGING past 48 hours MR ANGIO NECK WO CONTRAST  Result Date: 05/14/2019 CLINICAL DATA:  Neurological deficit. Code stroke. Abnormal sensation right side of the body. EXAM: MRI HEAD WITHOUT CONTRAST MRA NECK WITHOUT CONTRAST TECHNIQUE: Multiplanar, multiecho pulse sequences of the brain and surrounding structures were  obtained without intravenous contrast. Angiographic images of the neck were obtained using MRA technique without intravenous contrast. Carotid stenosis measurements (when applicable) are obtained utilizing NASCET criteria, using the distal internal carotid diameter as the denominator. COMPARISON:  Head CT earlier same day. FINDINGS: MRI HEAD FINDINGS Brain: Diffusion imaging does not show any acute or subacute infarction. Chronic small-vessel ischemic changes affect the pons. No focal cerebellar abnormality. Moderate chronic small-vessel ischemic changes affect the hemispheric white matter. No cortical or large vessel territory infarction. No mass lesion, hemorrhage, hydrocephalus or extra-axial collection. 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 1 cm meningioma of the right frontal convexity without significant mass-effect upon the brain, likely incidental and insignificant. Vascular: Major vessels at the base of the brain show flow. Skull and upper cervical spine: Negative Sinuses/Orbits: Diminutive maxillary sinuses. No evidence of acute/active sinus inflammation. Orbits negative. Other: None MRA NECK FINDINGS Both common carotid arteries are patent to their respective bifurcation. Detail is limited due to the lack of contrast and due to some patient motion. On the left, the internal and external carotid arteries are patent. There is atherosclerotic irregularity in the ICA bulb, with stenosis estimated at 50%. Cervical ICA patent beyond that. On  the right, there is advanced atherosclerotic disease at the bifurcation region. No flow is seen in the external carotid. There are severe serial stenoses of the ICA, possibly 80% or greater. Antegrade flow does persist in the more distal cervical ICA. Both vertebral arteries show antegrade flow with the right being dominant. Vertebral artery origins are not included using this technique. IMPRESSION: No acute  brain finding. Chronic small-vessel ischemic changes of the pons and cerebral hemispheric white matter. Advanced atherosclerotic disease at the carotid bifurcations, right worse than left. Serial stenoses of the right ICA bulb region, possibly 80% or greater. No flow seen in the right external carotid. On the left, there is atherosclerotic disease in the carotid siphon region with stenosis estimated at 50%. Detail is limited by the lack of contrast administration and patient motion. Electronically Signed   By: Nelson Chimes M.D.   On: 05/14/2019 19:57   MR BRAIN WO CONTRAST  Result Date: 05/14/2019 CLINICAL DATA:  Neurological deficit. Code stroke. Abnormal sensation right side of the body. EXAM: MRI HEAD WITHOUT CONTRAST MRA NECK WITHOUT CONTRAST TECHNIQUE: Multiplanar, multiecho pulse sequences of the brain and surrounding structures were obtained without intravenous contrast. Angiographic images of the neck were obtained using MRA technique without intravenous contrast. Carotid stenosis measurements (when applicable) are obtained utilizing NASCET criteria, using the distal internal carotid diameter as the denominator. COMPARISON:  Head CT earlier same day. FINDINGS: MRI HEAD FINDINGS Brain: Diffusion imaging does not show any acute or subacute infarction. Chronic small-vessel ischemic changes affect the pons. No focal cerebellar abnormality. Moderate chronic small-vessel ischemic changes affect the hemispheric white matter. No cortical or large vessel territory infarction. No mass lesion, hemorrhage, hydrocephalus or extra-axial collection. 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 1 cm meningioma of the right frontal convexity without significant mass-effect upon the brain, likely incidental and insignificant. Vascular: Major vessels at the base of the brain show flow. Skull and upper cervical spine: Negative Sinuses/Orbits: Diminutive maxillary  sinuses. No evidence of acute/active sinus inflammation. Orbits negative. Other: None MRA NECK FINDINGS Both common carotid arteries are patent to their respective bifurcation. Detail is limited due to the lack of contrast and due to some patient motion. On the left, the internal and external carotid arteries are patent. There is atherosclerotic irregularity in the ICA bulb, with stenosis estimated at 50%. Cervical ICA patent beyond that. On the right, there is advanced atherosclerotic disease at the bifurcation region. No flow is seen in the external carotid. There are severe serial stenoses of the ICA, possibly 80% or greater. Antegrade flow does persist in the more distal cervical ICA. Both vertebral arteries show antegrade flow with the right being dominant. Vertebral artery origins are not included using this technique. IMPRESSION: No acute brain finding. Chronic small-vessel ischemic changes of the pons and cerebral hemispheric white matter. Advanced atherosclerotic disease at the carotid bifurcations, right worse than left. Serial stenoses of the right ICA bulb region, possibly 80% or greater. No flow seen in the right external carotid. On the left, there is atherosclerotic disease in the carotid siphon region with stenosis estimated at 50%. Detail is limited by the lack of contrast administration and patient motion. Electronically Signed   By: Nelson Chimes M.D.   On: 05/14/2019 19:57   ECHOCARDIOGRAM COMPLETE  Result Date: 05/14/2019    ECHOCARDIOGRAM REPORT   Patient Name:   DARLEENE CUMPIAN Date of Exam: 05/14/2019 Medical Rec #:  [id number]  Height:       59.0 in Accession #:    [id number]            Weight:       132.1 lb Date of Birth:  October 11, 1945              BSA:          1.55 m Patient Age:    59 years              BP:           111/51 mmHg Patient Gender: F                     HR:           65 bpm. Exam Location:  Inpatient Procedure: 2D Echo Indications:    Stroke I163.9  History:         Patient has prior history of Echocardiogram examinations, most                 recent 09/21/2015. CAD and Previous Myocardial Infarction; Risk                 Factors:Dyslipidemia.  Sonographer:    Mikki Santee RDCS (AE) Referring Phys: [place]  1. Left ventricular ejection fraction, by estimation, is 60 to 65%. The left ventricle has normal function. The left ventrical has no regional wall motion abnormalities. Left ventricular diastolic parameters are consistent with Grade II diastolic dysfunction (pseudonormalization).  2. Right ventricular systolic function is normal. The right ventricular size is normal. There is moderately elevated pulmonary artery systolic pressure.  3. The mitral valve is normal in structure and function. trivial mitral valve regurgitation. No evidence of mitral stenosis.  4. The aortic valve is normal in structure and function. Aortic valve regurgitation is trivial . No aortic stenosis is present. FINDINGS  Left Ventricle: Left ventricular ejection fraction, by estimation, is 60 to 65%. The left ventricle has normal function. The left ventricle has no regional wall motion abnormalities. There is no left ventricular hypertrophy. Left ventricular diastolic parameters are consistent with Grade II diastolic dysfunction (pseudonormalization). Right Ventricle: The right ventricular size is normal. No increase in right ventricular wall thickness. Right ventricular systolic function is normal. There is moderately elevated pulmonary artery systolic pressure. The tricuspid regurgitant velocity is 3.42 m/s, and with an assumed right atrial pressure of 8 mmHg, the estimated right ventricular systolic pressure is 26.8 mmHg. Left Atrium: Left atrial size was normal in size. Right Atrium: Right atrial size was normal in size. Pericardium: There is no evidence of pericardial effusion. Mitral Valve: The mitral valve is normal in structure and function. Trivial mitral valve  regurgitation. No evidence of mitral valve stenosis. Tricuspid Valve: The tricuspid valve is grossly normal. Tricuspid valve regurgitation is trivial. No evidence of tricuspid stenosis. Aortic Valve: The aortic valve is normal in structure and function. Aortic valve regurgitation is trivial. Aortic regurgitation PHT measures 343 msec. No aortic stenosis is present. Pulmonic Valve: The pulmonic valve was grossly normal. Pulmonic valve regurgitation is not visualized. Aorta: The aortic root, ascending aorta and aortic arch are all structurally normal, with no evidence of dilitation or obstruction. IAS/Shunts: The atrial septum is grossly normal.  LEFT VENTRICLE PLAX 2D LVIDd:         4.20 cm  Diastology LVIDs:         2.70 cm  LV e' lateral:   8.16 cm/s LV PW:  1.00 cm  LV E/e' lateral: 13.1 LV IVS:        0.90 cm  LV e' medial:    6.53 cm/s LVOT diam:     1.80 cm  LV E/e' medial:  16.4 LV SV:         75.32 ml LV SV Index:   32.28 LVOT Area:     2.54 cm  RIGHT VENTRICLE RV S prime:     10.40 cm/s TAPSE (M-mode): 2.2 cm LEFT ATRIUM             Index       RIGHT ATRIUM           Index LA diam:        2.60 cm 1.68 cm/m  RA Area:     14.20 cm LA Vol (A2C):   45.2 ml 29.23 ml/m RA Volume:   30.70 ml  19.85 ml/m LA Vol (A4C):   40.5 ml 26.19 ml/m LA Biplane Vol: 45.0 ml 29.10 ml/m  AORTIC VALVE LVOT Vmax:   120.00 cm/s LVOT Vmean:  74.000 cm/s LVOT VTI:    0.296 m AI PHT:      343 msec  AORTA Ao Root diam: 2.60 cm MITRAL VALVE                         TRICUSPID VALVE MV Area (PHT): 3.27 cm              TR Peak grad:   46.8 mmHg MV Decel Time: 232 msec              TR Vmax:        342.00 cm/s MV E velocity: 107.00 cm/s 103 cm/s MV A velocity: 112.00 cm/s 70.3 cm/s SHUNTS MV E/A ratio:  0.96        1.5       Systemic VTI:  0.30 m                                      Systemic Diam: 1.80 cm Mertie Moores MD Electronically signed by Mertie Moores MD Signature Date/Time: 05/14/2019/4:47:07 PM    Final    CT HEAD  CODE STROKE WO CONTRAST  Result Date: 05/14/2019 CLINICAL DATA:  Code stroke. 74 year old female with decreased sensation on the right side. EXAM: CT HEAD WITHOUT CONTRAST TECHNIQUE: Contiguous axial images were obtained from the base of the skull through the vertex without intravenous contrast. COMPARISON:  Head CT 05/11/2019. FINDINGS: Brain: Again noted evidence of a small right frontal convexity meningioma, stable and approximately 8 mm with overlying hyperostosis (series 3, image 21). No associated frontal lobe edema. No midline shift or new intracranial mass effect. No acute intracranial hemorrhage identified. Normal cerebral volume for age. Patchy bilateral cerebral white matter and some deep gray matter hypodensity is stable from the recent comparison. No cortically based acute infarct identified. Vascular: Calcified atherosclerosis at the skull base. No suspicious intracranial vascular hyperdensity. Skull: No acute osseous abnormality identified. Sinuses/Orbits: Chronic paranasal sinus periosteal thickening. Visualized paranasal sinuses and mastoids are stable and well pneumatized. Other: No acute orbit or scalp soft tissue findings. ASPECTS [facility] Stroke Program Early CT Score) Total score (0-10 with 10 being normal): 10 IMPRESSION: 1. Stable CT appearance of chronic small vessel disease from the recent 05/11/2019 comparison. No acute cortically based infarct or intracranial hemorrhage identified. ASPECTS 10. 2.  Stable small right frontal convexity meningioma without evidence of associated cerebral edema. 3. These results were communicated to Dr. Lorraine Lax or at 2:19 pmon 2/11/2021by text page via the [facility] messaging system. Electronically Signed   By: Genevie Ann M.D.   On: 05/14/2019 14:20    PHYSICAL EXAM  Temp:  [98 F (36.7 C)-99.3 F (37.4 C)] 98 F (36.7 C) (02/12 1430) Pulse Rate:  [63-90] 71 (02/12 1450) Resp:  [12-19] 17 (02/12 1450) BP: (111-145)/(41-85) 136/45 (02/12 1450) SpO2:  [93  %-99 %] 97 % (02/12 1450) Weight:  [59.9 kg-62.7 kg] 59.9 kg (02/12 1311)  General - Well nourished, well developed, in no apparent distress.  Ophthalmologic - fundi not visualized due to noncooperation.  Cardiovascular - Regular rhythm and rate.  Mental Status -  Level of arousal and orientation to time, place, and person were intact. Language including expression, naming, repetition, comprehension was assessed and found intact. No dysarthria. With emotional change, she has hesitancy of speech intermittently. Intact concentration and attention, backward spelling WORTH was correct.  Fund of Knowledge was assessed and was intact.  Cranial Nerves II - XII - II - Visual field intact OU. III, IV, VI - Extraocular movements intact. V - Facial sensation was subjectively decreased on the right, 70% of the left. VII - Facial movement intact bilaterally. VIII - Hearing & vestibular intact bilaterally. X - Palate elevates symmetrically. XI - Chin turning & shoulder shrug intact bilaterally. XII - Tongue protrusion intact.  Motor Strength - The patient's strength was normal in all extremities and pronator drift was absent.  Bulk was normal and fasciculations were absent.   Motor Tone - Muscle tone was assessed at the neck and appendages and was normal.  Reflexes - The patient's reflexes were symmetrical in all extremities and she had no pathological reflexes.  Sensory - Light touch, temperature/pinprick were assessed and were subjectively decreased on the right, about 80% of the left.    Coordination - The patient had normal movements in the hands with no ataxia or dysmetria.  Tremor was absent.  Gait and Station - deferred.   ASSESSMENT/PLAN Ms. Madia Carvell is a 74 y.o. female with history of tobacco abuse, GI bleed d/t duodenal AVMs, essential hypertension, dyslipidemia, chronic kidney disease, carotid artery disease with right ICA 50-69% stenosis intially presenting with  dizziness, hgb 7.7, scheduled for EGD on Friday. In hospital she developed dysarthria and decreased sensation R arm and leg.   L brain MRI-negative stroke vs. Conversion    Resulting subjective right hemiparesthesia and speech hesitancy/slow with emotional change  CT head 2/8 no acute abnormality. R frontal covexity 50mm meningioma partially calcified.   CT head 2/11 No acute abnormality. Stable from 2/8. R frontal meningioma.      MRI  No acute abnormality. Small vessel disease.   MRA neck   R>L ICA bifurcation atherosclerosis w/ serial stenosis R ICA bulb > 80% with no flow ECA. L ICA siphon 50% stenosis.  CTA head & neck pending  Carotid Doppler  pending  2D Echo EF 60-65%. No source of embolus   LDL 35  HgbA1c 6.4  SCDs for VTE prophylaxis  aspirin 81 mg daily and clopidogrel 75 mg daily prior to admission, now resume home ASA 81 and plavix 75.    Therapy recommendations:  OP SLP, no PT  Disposition:  Return home  Carotid stenosis, bilateral  08/2018 carotid Doppler right ICA 60 to 79% stenosis, left ICA 40 to 59% stenosis  MRA  neck   R>L ICA bifurcation atherosclerosis w/ serial stenosis R ICA bulb > 80% with no flow ECA. L ICA siphon 50% stenosis.  CTA head & neck pending  Carotid Doppler  pending  Given her right-sided symptoms, and severe carotid stenosis, may consider vascular surgery consultation after above test completed  History of GI bleeding Severe anemia  History of duodenal AVMs  GI on board  Had proximal small bowel enteroscopy 2/12 - normal study  Cleared for antiplatelet therapy from GI   Hb 7.7->8.0->7.7  Anemia work-up and treatment as per primary team  Hypertension  Stable . BP goal 130-160 given bilateral ICA stenosis   Hyperlipidemia  Home meds:  lipitor 80 and zetia 10, resumed in hospital  LDL 35, goal < 70  Continue statin at discharge  Other Stroke Risk Factors  Advanced age  Hx Cigarette smoker, quit in  2015  ETOH use, advised to drink no more than 1 drink(s) a day  Coronary artery disease s/p MI w/ PCI, DES  Chronic diastolic Congestive heart failure (HFpEF)  PAD  Other Active Problems  AKI on CKD stage II  COPD  Hospital day # 2  Rosalin Hawking, MD PhD Stroke Neurology 05/15/2019 5:28 PM   To contact Stroke Continuity provider, please refer to [email or web address] After hours, contact General Neurology

## 2019-05-15 NOTE — Interval H&P Note (Signed)
History and Physical Interval Note:  05/15/2019 2:03 PM  Kayla Mcclure  has presented today for surgery, with the diagnosis of Anemia.  The various methods of treatment have been discussed with the patient.  After consideration of risks, benefits and other options for treatment, the patient has consented to  Procedure(s) with comments: ENTEROSCOPY (N/A) - Push enteroscopy using either ultraslim colonoscope or push enteroscope as a surgical intervention.  The patient's history has been reviewed, patient examined, no change in status, stable for surgery.  I have reviewed the patient's chart and labs.  Questions were answered to the patient's satisfaction.     Youlanda Mighty [name]

## 2019-05-16 LAB — CBC
HCT: 28.3 % — ABNORMAL LOW (ref 36.0–46.0)
Hemoglobin: 8.2 g/dL — ABNORMAL LOW (ref 12.0–15.0)
MCH: 21.9 pg — ABNORMAL LOW (ref 26.0–34.0)
MCHC: 29 g/dL — ABNORMAL LOW (ref 30.0–36.0)
MCV: 75.5 fL — ABNORMAL LOW (ref 80.0–100.0)
Platelets: 234 10*3/uL (ref 150–400)
RBC: 3.75 MIL/uL — ABNORMAL LOW (ref 3.87–5.11)
RDW: 19.3 % — ABNORMAL HIGH (ref 11.5–15.5)
WBC: 6.7 10*3/uL (ref 4.0–10.5)
nRBC: 0 % (ref 0.0–0.2)

## 2019-05-16 NOTE — Progress Notes (Signed)
Pt discharge education and instructions completed with pt at bedside; pt voices understanding and denies any questions. Pt IV and telemetry removed; pt discharge home with family to transport off to disposition. Pt transported off unit via wheelchair with belongings to the side. Delia Heady RN

## 2019-05-16 NOTE — Discharge Summary (Signed)
Kayla Mcclure, is a 74 y.o. female  DOB 07-01-1945  MRN [id number].  Admission date:  05/11/2019  Admitting Physician  Geradine Girt, DO  Discharge Date:  05/16/2019   Primary MD  Lois Huxley, PA  Recommendations for primary care physician for things to follow:  CBC and CMP   Admission Diagnosis  Symptomatic anemia [D64.9] Syncope, unspecified syncope type [R55] GI bleed [K92.2]   Discharge Diagnosis  Symptomatic anemia [D64.9] Syncope, unspecified syncope type [R55] GI bleed [K92.2]    Principal Problem:   Symptomatic anemia Active Problems:   Essential hypertension, benign   CAD S/P percutaneous coronary angioplasty -- Promus DES 2.5 mm x 20 mm postdilated to 3 mm   Asthma   GI bleed   Acute renal failure superimposed on stage 2 chronic kidney disease (HCC)   Syncope   Chronic diastolic CHF (congestive heart failure) (HCC)   Chronic diastolic heart failure ([place])      Past Medical History:  Diagnosis Date  . Asthma  10/31/2007  . CAD (coronary artery disease) 08/05/2007   a) NSTEMI 2009: PCI to the RCA Promus DES 2.5 mm x 23 mm (3.0 mm). b) Myoview June 2017: Nonspecific ST changes. EF greater than 65%. LOW RISK, normal study. No ischemia or infarction.  . Carotid artery disease ([place])    a. 50-69% RICA by outpt dopp 04/22/13, f/u due 04/2014.  Marland Kitchen Chronic back pain   . Chronic diastolic heart failure ([place])   . CKD (chronic kidney disease), stage II   . Diverticulosis   04/11/2010    Colonoscopy  . Dyslipidemia, goal LDL below 70   . Essential hypertension    a. Normal renal arteries by PV angio 05/2013.  Marland Kitchen GERD (gastroesophageal reflux disease)   . GI bleed 12/2015  . History of recent blood transfusion 12/2015   12-19-15, 12-20-15 and 12-21-15  . Insomnia   . Internal hemorrhoids  04/11/2010   By colonoscopy  . Iron deficiency anemia   . Myocardial infarction ([place]) 2009  .  Peripheral arterial disease ([place])    a. s/p PTA and stenting of L CIA stenosis 05/2013.  Marland Kitchen Renal insufficiency   . Tobacco abuse    Quit in 05/2013    Past Surgical History:  Procedure Laterality Date  . CHOLECYSTECTOMY  1975  . COLONOSCOPY  04/11/2010  . colonscopy  11/30/2015  . CORONARY ANGIOPLASTY WITH STENT PLACEMENT  08/05/2007   Promus DES 2.5 mm x 23 mm (3 mm)  to RCA; EF 60-70%  . ENTEROSCOPY N/A 12/27/2015   Procedure: ENTEROSCOPY;  Surgeon: Clarene Essex, MD;  Location: [facility] ENDOSCOPY;  Service: Endoscopy;  Laterality: N/A;  also needs slim egd scope  . ESOPHAGOGASTRODUODENOSCOPY N/A 09/13/2013   Procedure: ESOPHAGOGASTRODUODENOSCOPY (EGD);  Surgeon: Lear Ng, MD;  Location: [facility] ENDOSCOPY;  Service: Endoscopy;  Laterality: N/A;  . ESOPHAGOGASTRODUODENOSCOPY N/A 07/14/2015   Procedure: ESOPHAGOGASTRODUODENOSCOPY (EGD);  Surgeon: Clarene Essex, MD;  Location: [facility] ENDOSCOPY;  Service: Endoscopy;  Laterality: N/A;  . gi bleed  07/2015   blood given  . HOT HEMOSTASIS N/A 12/27/2015   Procedure: HOT HEMOSTASIS (ARGON PLASMA COAGULATION/BICAP);  Surgeon: Clarene Essex, MD;  Location: [facility];  Service: Endoscopy;  Laterality: N/A;  . ILIAC ARTERY STENT Left 05/04/2013   L CIA-EIA -- Dr. Gwenlyn Found  . LEFT HEART CATH AND CORONARY ANGIOGRAPHY N/A 02/11/2019   Procedure: LEFT HEART CATH AND CORONARY ANGIOGRAPHY;  Surgeon: Leonie Man, MD;  Location: [facility] CV LAB;;-  Patent RCA stent with minimal ISR after pRCA 50%).  Small caliber RPAV with ~75%.  pLAD 30%, m-dLAD 40%, D2 40%.  Normal LVEDP and LVEF.  . LOWER EXTREMITY ANGIOGRAM Bilateral 05/04/2013   Procedure: LOWER EXTREMITY ANGIOGRAM;  Surgeon: Lorretta Harp, MD;  Location: [facility] CATH LAB;  Service: Cardiovascular;  Laterality: Bilateral;  . NM MYOVIEW LTD  09/07/2015   EF > 65%. Nonspecific ST changes but no ischemic changes. No ischemia or infarction. LOW RISK  . TONSILLECTOMY  1960's  . TRANSTHORACIC ECHOCARDIOGRAM  09/21/2015    EF 60-65%. GR 1 DD. No regional wall motion analysis. No valve disease.  Marland Kitchen UPPER GI ENDOSCOPY  12/14/2015       HPI  from the history and physical done on the day of admission:   Kayla Mcclure is a 74 y.o. female with medical history significant for CAD with stents, chronic kidney disease stage II, chronic diastolic CHF, history of GI bleeding attributed to small bowel AVM, now presenting to the emergency department for evaluation of lightheadedness, syncopal episode 1 week ago, and low hemoglobin on outpatient blood work.  Patient reports insidious development of exertional dyspnea without any fever or cough, and then had a syncopal episode 1 week ago.  She continues to be lightheaded upon standing, but denies any further syncope, denies chest pain, and denies palpitations.  She has not noted any change in bowel habits, abdominal pain, melena, or hematochezia.  She has history of GI bleeding a few years ago, had extensive work-up with GI that revealed angiodysplasia in the jejunum, treated with argon plasma, and had not experienced any further issues with this.  She takes aspirin and Plavix, previously took a PPI daily, but now only uses PPI as needed.  ED Course: Upon arrival to the ED, patient is found to be afebrile, saturating well on room air, and with stable blood pressure.  EKG features a sinus rhythm with PAC.  Noncontrast head CT is negative for acute intracranial abnormality but notable for stable 7 mm partially calcified lesion which appears stable and is likely a meningioma.  Chemistry panel notable for creatinine 1.32, up from 0.96 in November.  CBC features a microcytic anemia with hemoglobin 8.5, down from 12.0 in November.  Fecal occult blood testing is negative.  Type and screen was performed in the emergency department and COVID-19 screening test ordered but not yet resulted.  Review of Systems:  All other systems reviewed and apart from HPI, are negative.       Hospital Course:    Kayla Mcclure a 74 y.o.femalewith a history of CAD s/p PCI on DAPT, stage II CKD, chronic HFpEF, and GI bleeding due to small bowel AVMs treated with APC in 2017 who presented to the ED with lightheadedness, syncopal event 1 week prior and low hemoglobin per outpatient work-up. She was found to have hgb 8.5 down from 12 with creatinine 1.32, up from 0.96 previously. She was admitted for symptomatic anemia due to blood loss from GI source. GI consulted  and recommended EGD with enteroscopy after DAPT had been held for 5 days. This is planned 2/12. On 2/11 she began experiencing abnormal speech, stuttering, word finding difficulty, and right finger paresthesias. Code stroke was called. CT and subsequent MRI failed to reveal acute CVA. Speech difficulties appear to be somewhat improved with speech therapy evaluation and management, paresthesias resolved.     Symptomatic chronic blood loss anemia/iron deficiency anemia due to intermittent low-grade GI bleeding with history of small bowel AVMs: No evidence of large acute bleeding with negative FOBTs.  - GI consult - enteroscopy 05/15/2019-Patient's proximal small bowel enteroscopy was completely normal other than a small hiatal hernia and Schatzki's ring.  Specifically, no source of anemia or drop in hemoglobin was identified. .  - Continue po iron. -Cont on PPI with monitoring of H/H - overall stable.  Stuttering, word blocking, paresthesias, r/o CVA:  Meningioma without mass effect is stable, no CVA on CT or MRI. - MRA shows bilateral carotid artery stenoses (R > L)  - LDL 35. On statin, zetia due to CAD - HbA1c 6.4% qualifying for prediabetes. - recommend PCP follow up. - Echocardiogram without cardioembolic source  -MRA neck   R>L ICA bifurcation atherosclerosis w/ serial stenosis R ICA bulb > 80% with no flow ECA. L ICA siphon 50% stenosis.  CTA head & neck - Continued normal appearance of the brain itself. 1  cm right frontal convexity meningioma without significant mass-effect upon the brain. Severe atherosclerotic disease at both carotid bifurcation and ICA       bulb regions. Serial stenoses of the right ICA bulb at 80% or greater. Long segment stenosis of the left ICA bulb measuring 60-65% by CT. Atherosclerotic disease in both carotid siphon regions. No stenosisgreater than 30%       on the left. 30-50% stenosis on the right. 50% stenosis of the proximal left subclavian artery. 50% stenosis a the left vertebral artery origin, with wide patency beyond. Carotid Doppler - bilateral carotid stenosis - 60-79%  - Stroke neurology consulted - recomenmded resumption of home ASA 81 and plavix 75 with out-patient SLP    AKI on stage II CKD: CrCl remains elevated - Continue gentle IVF with NPO status until procedure - Continue monitoring. - Avoid nephrotoxins as able.  CAD s/p PCI: No angina.  - Holding antiplatelets for now - Continuing statin, beta blocker  Chronic HFpEF, HTN: Appears euvolemic. Echo confirms grade II diastolic dysfunction, LVEF preserved. - Holding diuretics with AKI - Monitor I/O, weights - Continue imdur, metoprolol  COPD: Presumed diagnosis for home breo ellipta.  - Continue inhaler      Discharge Condition: stable  Follow UP GI next week Neurology 1-2 weeks Out-patient SLP follow -up     Consults obtained - GI Neuroplgy  Diet and Activity recommendation:  As advised  Discharge Instructions    Discharge Instructions    Call MD for:   Complete by: As directed    Call MD for:   Complete by: As directed    Call MD for:  difficulty breathing, headache or visual disturbances   Complete by: As directed    Call MD for:  difficulty breathing, headache or visual disturbances   Complete by: As directed    Call MD for:  extreme fatigue   Complete by: As directed    Call MD for:  extreme fatigue   Complete by: As directed    Call MD for:  hives    Complete by: As directed  Call MD for:  hives   Complete by: As directed    Call MD for:  persistant dizziness or light-headedness   Complete by: As directed    Call MD for:  persistant dizziness or light-headedness   Complete by: As directed    Call MD for:  persistant nausea and vomiting   Complete by: As directed    Call MD for:  persistant nausea and vomiting   Complete by: As directed    Call MD for:  redness, tenderness, or signs of infection (pain, swelling, redness, odor or green/yellow discharge around incision site)   Complete by: As directed    Call MD for:  redness, tenderness, or signs of infection (pain, swelling, redness, odor or green/yellow discharge around incision site)   Complete by: As directed    Call MD for:  severe uncontrolled pain   Complete by: As directed    Call MD for:  severe uncontrolled pain   Complete by: As directed    Call MD for:  temperature >100.4   Complete by: As directed    Call MD for:  temperature >100.4   Complete by: As directed    Diet - low sodium heart healthy   Complete by: As directed    Diet - low sodium heart healthy   Complete by: As directed    Increase activity slowly   Complete by: As directed    Increase activity slowly   Complete by: As directed         Discharge Medications     Allergies as of 05/16/2019   No Known Allergies     Medication List    STOP taking these medications   amLODipine 10 MG tablet Commonly known as: NORVASC   furosemide 20 MG tablet Commonly known as: LASIX   hydrALAZINE 50 MG tablet Commonly known as: APRESOLINE   hydrochlorothiazide 25 MG tablet Commonly known as: HYDRODIURIL     TAKE these medications   aspirin EC 81 MG tablet Take 81 mg by mouth daily.   atorvastatin 80 MG tablet Commonly known as: LIPITOR Take 80 mg by mouth daily.   Breo Ellipta 100-25 MCG/INH Aepb Generic drug: fluticasone furoate-vilanterol Inhale 1 puff into the lungs daily.   clopidogrel  75 MG tablet Commonly known as: PLAVIX Take 1 tablet (75 mg total) by mouth daily.   ezetimibe 10 MG tablet Commonly known as: ZETIA Take 10 mg by mouth daily.   ferrous gluconate 324 MG tablet Commonly known as: FERGON Take 1 tablet (324 mg total) by mouth 2 (two) times daily with a meal.   gabapentin 300 MG capsule Commonly known as: NEURONTIN Take 1 capsule (300 mg total) by mouth 2 (two) times daily. What changed:   when to take this  reasons to take this   isosorbide mononitrate 60 MG 24 hr tablet Commonly known as: IMDUR Take 1.5 tablets (90 mg total) by mouth daily.   loratadine 10 MG tablet Commonly known as: CLARITIN Take 10 mg by mouth daily as needed for allergies.   metoprolol succinate 100 MG 24 hr tablet Commonly known as: TOPROL-XL Take 2 tablets (200 mg total) by mouth at bedtime. Take with or immediately following a meal. What changed:   how much to take  additional instructions   nitroGLYCERIN 0.4 MG SL tablet Commonly known as: NITROSTAT Place 1 tablet (0.4 mg total) under the tongue every 5 (five) minutes as needed for chest pain.   pantoprazole 40 MG tablet Commonly known  as: PROTONIX Take 1 tablet (40 mg total) by mouth daily. What changed:   when to take this  reasons to take this       Major procedures and Radiology Reports - PLEASE review detailed and final reports for all details, in brief -    CT ANGIO HEAD W OR WO CONTRAST  Result Date: 05/15/2019 CLINICAL DATA:  Abnormal sensation of the right side of the body with acute presentation yesterday. EXAM: CT ANGIOGRAPHY HEAD AND NECK TECHNIQUE: Multidetector CT imaging of the head and neck was performed using the standard protocol during bolus administration of intravenous contrast. Multiplanar CT image reconstructions and MIPs were obtained to evaluate the vascular anatomy. Carotid stenosis measurements (when applicable) are obtained utilizing NASCET criteria, using the distal  internal carotid diameter as the denominator. CONTRAST:  43mL OMNIPAQUE IOHEXOL 350 MG/ML SOLN COMPARISON:  CT and MRI studies 05/14/2019 FINDINGS: CT HEAD FINDINGS Brain: No sign of old or acute focal infarction, intra-axial mass lesion, hemorrhage, hydrocephalus or extra-axial collection. 1 cm meningioma at the right frontal convexity without significant mass-effect upon brain as seen previously. Vascular: There is atherosclerotic calcification of the major vessels at the base of the brain. Skull: Negative Sinuses: Clear Orbits: Normal Review of the MIP images confirms the above findings CTA NECK FINDINGS Aortic arch: Aortic atherosclerosis. Branching pattern is normal. 50% stenosis of the left subclavian artery origin. Right carotid system: Right common carotid artery shows some atherosclerotic plaque but is widely patent to the bifurcation region, without stenosis greater than 20%. At the carotid bifurcation, there is advanced calcified plaque. There is very severe stenosis of the external carotid artery, which does show some flow beyond the severe calcified plaque. At the proximal internal carotid artery, luminal diameter is 1 mm or less. There are serial stenoses of a similar degree. Compared to a more distal cervical ICA diameter of 5 mm, this indicates serial 80% or greater stenoses and would likely be flow limiting. Left carotid system: Common carotid artery shows some plaque but no stenosis greater than 20%. There is severe calcified plaque at the carotid bifurcation and ICA bulb. Minimal diameter through the region measures 1.7 mm. Compared to a more distal cervical ICA diameter of 4.5 mm, this indicates long segment 60-65% stenosis. Vertebral arteries: Right vertebral artery is widely patent. There is atherosclerotic plaque at the left vertebral artery origin with stenosis estimated at 50%. Beyond the origins, both vertebral arteries are widely patent through the cervical region to the foramen magnum.  Skeleton: Ordinary mid cervical spondylosis. Other neck: No mass or lymphadenopathy. Upper chest: Bilateral patchy densities in both upper lobes, not present on a CT scan of 2013. This could represent scarring or active pneumonia, possibly with atypical organisms. Review of the MIP images confirms the above findings CTA HEAD FINDINGS Anterior circulation: Both internal carotid arteries are patent at the skull base and through the siphon regions. There is siphon atherosclerotic calcification on both sides. No stenosis on the left greater than 30%. Stenosis in the 30-50% range on the right. Supraclinoid internal carotid arteries are widely patent. Anterior and middle cerebral vessels are patent. No large or medium vessel occlusion identified. Patent bilateral posterior communicating arteries. Posterior circulation: Both vertebral arteries patent through the foramen magnum to the basilar. No distal vertebral stenosis. No basilar stenosis. Posterior circulation branch vessels are patent and normal. Venous sinuses: Patent and normal. Anatomic variants: None significant. Review of the MIP images confirms the above findings IMPRESSION: Continued normal appearance of the  brain itself. 1 cm right frontal convexity meningioma without significant mass-effect upon the brain. Severe atherosclerotic disease at both carotid bifurcation and ICA bulb regions. Serial stenoses of the right ICA bulb at 80% or greater. Long segment stenosis of the left ICA bulb measuring 60-65% by CT. Atherosclerotic disease in both carotid siphon regions. No stenosis greater than 30% on the left. 30-50% stenosis on the right. 50% stenosis of the proximal left subclavian artery. 50% stenosis at the left vertebral artery origin, with wide patency beyond. Electronically Signed   By: Nelson Chimes M.D.   On: 05/15/2019 22:16   CT Head Wo Contrast  Result Date: 05/11/2019 CLINICAL DATA:  Syncope on Tuesday, now with dizziness EXAM: CT HEAD WITHOUT  CONTRAST TECHNIQUE: Contiguous axial images were obtained from the base of the skull through the vertex without intravenous contrast. COMPARISON:  CT head 09/19/2011 FINDINGS: Brain: 7 x 6 mm partially calcified likely dural based lesion along the right frontal convexity with some subjacent hyperostotic change is most compatible with a meningioma with minimal if any real adjacent mass effect. No adjacent edema. No evidence of acute infarct or hemorrhage. Cavum vergae is noted. Patchy areas of white matter hypoattenuation are most compatible with chronic microvascular angiopathy. Symmetric prominence of the ventricles, cisterns and sulci compatible with parenchymal volume loss. Vascular: Atherosclerotic calcification of the carotid siphons and intradural vertebral arteries. No hyperdense vessel. Skull: No calvarial fracture or suspicious osseous lesion. No scalp swelling or hematoma. Sinuses/Orbits: Paranasal sinuses and mastoid air cells are predominantly clear. Included orbital structures are unremarkable. Other: None IMPRESSION: 1. No acute intracranial findings. 2. 7 mm partially calcified likely dural based lesion along the right frontal convexity with some subjacent hyperostotic change is most compatible with a meningioma. Appearance is stable from comparison studies. If further characterization is clinically warranted could consider outpatient MR. 3. Symmetric prominence of the ventricles, cisterns and sulci compatible with parenchymal volume loss. Patchy areas of white matter hypoattenuation are most compatible with chronic microvascular angiopathy. Electronically Signed   By: Lovena Le M.D.   On: 05/11/2019 23:23   CT ANGIO NECK W OR WO CONTRAST  Result Date: 05/15/2019 CLINICAL DATA:  Abnormal sensation of the right side of the body with acute presentation yesterday. EXAM: CT ANGIOGRAPHY HEAD AND NECK TECHNIQUE: Multidetector CT imaging of the head and neck was performed using the standard protocol  during bolus administration of intravenous contrast. Multiplanar CT image reconstructions and MIPs were obtained to evaluate the vascular anatomy. Carotid stenosis measurements (when applicable) are obtained utilizing NASCET criteria, using the distal internal carotid diameter as the denominator. CONTRAST:  65mL OMNIPAQUE IOHEXOL 350 MG/ML SOLN COMPARISON:  CT and MRI studies 05/14/2019 FINDINGS: CT HEAD FINDINGS Brain: No sign of old or acute focal infarction, intra-axial mass lesion, hemorrhage, hydrocephalus or extra-axial collection. 1 cm meningioma at the right frontal convexity without significant mass-effect upon brain as seen previously. Vascular: There is atherosclerotic calcification of the major vessels at the base of the brain. Skull: Negative Sinuses: Clear Orbits: Normal Review of the MIP images confirms the above findings CTA NECK FINDINGS Aortic arch: Aortic atherosclerosis. Branching pattern is normal. 50% stenosis of the left subclavian artery origin. Right carotid system: Right common carotid artery shows some atherosclerotic plaque but is widely patent to the bifurcation region, without stenosis greater than 20%. At the carotid bifurcation, there is advanced calcified plaque. There is very severe stenosis of the external carotid artery, which does show some flow beyond the severe calcified  plaque. At the proximal internal carotid artery, luminal diameter is 1 mm or less. There are serial stenoses of a similar degree. Compared to a more distal cervical ICA diameter of 5 mm, this indicates serial 80% or greater stenoses and would likely be flow limiting. Left carotid system: Common carotid artery shows some plaque but no stenosis greater than 20%. There is severe calcified plaque at the carotid bifurcation and ICA bulb. Minimal diameter through the region measures 1.7 mm. Compared to a more distal cervical ICA diameter of 4.5 mm, this indicates long segment 60-65% stenosis. Vertebral arteries:  Right vertebral artery is widely patent. There is atherosclerotic plaque at the left vertebral artery origin with stenosis estimated at 50%. Beyond the origins, both vertebral arteries are widely patent through the cervical region to the foramen magnum. Skeleton: Ordinary mid cervical spondylosis. Other neck: No mass or lymphadenopathy. Upper chest: Bilateral patchy densities in both upper lobes, not present on a CT scan of 2013. This could represent scarring or active pneumonia, possibly with atypical organisms. Review of the MIP images confirms the above findings CTA HEAD FINDINGS Anterior circulation: Both internal carotid arteries are patent at the skull base and through the siphon regions. There is siphon atherosclerotic calcification on both sides. No stenosis on the left greater than 30%. Stenosis in the 30-50% range on the right. Supraclinoid internal carotid arteries are widely patent. Anterior and middle cerebral vessels are patent. No large or medium vessel occlusion identified. Patent bilateral posterior communicating arteries. Posterior circulation: Both vertebral arteries patent through the foramen magnum to the basilar. No distal vertebral stenosis. No basilar stenosis. Posterior circulation branch vessels are patent and normal. Venous sinuses: Patent and normal. Anatomic variants: None significant. Review of the MIP images confirms the above findings IMPRESSION: Continued normal appearance of the brain itself. 1 cm right frontal convexity meningioma without significant mass-effect upon the brain. Severe atherosclerotic disease at both carotid bifurcation and ICA bulb regions. Serial stenoses of the right ICA bulb at 80% or greater. Long segment stenosis of the left ICA bulb measuring 60-65% by CT. Atherosclerotic disease in both carotid siphon regions. No stenosis greater than 30% on the left. 30-50% stenosis on the right. 50% stenosis of the proximal left subclavian artery. 50% stenosis at the left  vertebral artery origin, with wide patency beyond. Electronically Signed   By: Nelson Chimes M.D.   On: 05/15/2019 22:16   MR ANGIO NECK WO CONTRAST  Result Date: 05/14/2019 CLINICAL DATA:  Neurological deficit. Code stroke. Abnormal sensation right side of the body. EXAM: MRI HEAD WITHOUT CONTRAST MRA NECK WITHOUT CONTRAST TECHNIQUE: Multiplanar, multiecho pulse sequences of the brain and surrounding structures were obtained without intravenous contrast. Angiographic images of the neck were obtained using MRA technique without intravenous contrast. Carotid stenosis measurements (when applicable) are obtained utilizing NASCET criteria, using the distal internal carotid diameter as the denominator. COMPARISON:  Head CT earlier same day. FINDINGS: MRI HEAD FINDINGS Brain: Diffusion imaging does not show any acute or subacute infarction. Chronic small-vessel ischemic changes affect the pons. No focal cerebellar abnormality. Moderate chronic small-vessel ischemic changes affect the hemispheric white matter. No cortical or large vessel territory infarction. No mass lesion, hemorrhage, hydrocephalus or extra-axial collection. 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 1 cm meningioma of the right frontal convexity without significant mass-effect upon the brain, likely incidental and insignificant. Vascular: Major vessels at the base of the brain show flow. Skull and upper cervical  spine: Negative Sinuses/Orbits: Diminutive maxillary sinuses. No evidence of acute/active sinus inflammation. Orbits negative. Other: None MRA NECK FINDINGS Both common carotid arteries are patent to their respective bifurcation. Detail is limited due to the lack of contrast and due to some patient motion. On the left, the internal and external carotid arteries are patent. There is atherosclerotic irregularity in the ICA bulb, with stenosis estimated at 50%. Cervical ICA patent beyond  that. On the right, there is advanced atherosclerotic disease at the bifurcation region. No flow is seen in the external carotid. There are severe serial stenoses of the ICA, possibly 80% or greater. Antegrade flow does persist in the more distal cervical ICA. Both vertebral arteries show antegrade flow with the right being dominant. Vertebral artery origins are not included using this technique. IMPRESSION: No acute brain finding. Chronic small-vessel ischemic changes of the pons and cerebral hemispheric white matter. Advanced atherosclerotic disease at the carotid bifurcations, right worse than left. Serial stenoses of the right ICA bulb region, possibly 80% or greater. No flow seen in the right external carotid. On the left, there is atherosclerotic disease in the carotid siphon region with stenosis estimated at 50%. Detail is limited by the lack of contrast administration and patient motion. Electronically Signed   By: Nelson Chimes M.D.   On: 05/14/2019 19:57   MR BRAIN WO CONTRAST  Result Date: 05/14/2019 CLINICAL DATA:  Neurological deficit. Code stroke. Abnormal sensation right side of the body. EXAM: MRI HEAD WITHOUT CONTRAST MRA NECK WITHOUT CONTRAST TECHNIQUE: Multiplanar, multiecho pulse sequences of the brain and surrounding structures were obtained without intravenous contrast. Angiographic images of the neck were obtained using MRA technique without intravenous contrast. Carotid stenosis measurements (when applicable) are obtained utilizing NASCET criteria, using the distal internal carotid diameter as the denominator. COMPARISON:  Head CT earlier same day. FINDINGS: MRI HEAD FINDINGS Brain: Diffusion imaging does not show any acute or subacute infarction. Chronic small-vessel ischemic changes affect the pons. No focal cerebellar abnormality. Moderate chronic small-vessel ischemic changes affect the hemispheric white matter. No cortical or large vessel territory infarction. No mass lesion,  hemorrhage, hydrocephalus or extra-axial collection. 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 1 cm meningioma of the right frontal convexity without significant mass-effect upon the brain, likely incidental and insignificant. Vascular: Major vessels at the base of the brain show flow. Skull and upper cervical spine: Negative Sinuses/Orbits: Diminutive maxillary sinuses. No evidence of acute/active sinus inflammation. Orbits negative. Other: None MRA NECK FINDINGS Both common carotid arteries are patent to their respective bifurcation. Detail is limited due to the lack of contrast and due to some patient motion. On the left, the internal and external carotid arteries are patent. There is atherosclerotic irregularity in the ICA bulb, with stenosis estimated at 50%. Cervical ICA patent beyond that. On the right, there is advanced atherosclerotic disease at the bifurcation region. No flow is seen in the external carotid. There are severe serial stenoses of the ICA, possibly 80% or greater. Antegrade flow does persist in the more distal cervical ICA. Both vertebral arteries show antegrade flow with the right being dominant. Vertebral artery origins are not included using this technique. IMPRESSION: No acute brain finding. Chronic small-vessel ischemic changes of the pons and cerebral hemispheric white matter. Advanced atherosclerotic disease at the carotid bifurcations, right worse than left. Serial stenoses of the right ICA bulb region, possibly 80% or greater. No flow seen in the right external carotid. On the  left, there is atherosclerotic disease in the carotid siphon region with stenosis estimated at 50%. Detail is limited by the lack of contrast administration and patient motion. Electronically Signed   By: Nelson Chimes M.D.   On: 05/14/2019 19:57   ECHOCARDIOGRAM COMPLETE  Result Date: 05/14/2019    ECHOCARDIOGRAM REPORT   Patient Name:   RYLYNN KOBS Date of Exam: 05/14/2019 Medical Rec #:  [id number]             Height:       59.0 in Accession #:    [id number]            Weight:       132.1 lb Date of Birth:  01-Apr-1946              BSA:          1.55 m Patient Age:    37 years              BP:           111/51 mmHg Patient Gender: F                     HR:           65 bpm. Exam Location:  Inpatient Procedure: 2D Echo Indications:    Stroke I163.9  History:        Patient has prior history of Echocardiogram examinations, most                 recent 09/21/2015. CAD and Previous Myocardial Infarction; Risk                 Factors:Dyslipidemia.  Sonographer:    Mikki Santee RDCS (AE) Referring Phys: [place]  1. Left ventricular ejection fraction, by estimation, is 60 to 65%. The left ventricle has normal function. The left ventrical has no regional wall motion abnormalities. Left ventricular diastolic parameters are consistent with Grade II diastolic dysfunction (pseudonormalization).  2. Right ventricular systolic function is normal. The right ventricular size is normal. There is moderately elevated pulmonary artery systolic pressure.  3. The mitral valve is normal in structure and function. trivial mitral valve regurgitation. No evidence of mitral stenosis.  4. The aortic valve is normal in structure and function. Aortic valve regurgitation is trivial . No aortic stenosis is present. FINDINGS  Left Ventricle: Left ventricular ejection fraction, by estimation, is 60 to 65%. The left ventricle has normal function. The left ventricle has no regional wall motion abnormalities. There is no left ventricular hypertrophy. Left ventricular diastolic parameters are consistent with Grade II diastolic dysfunction (pseudonormalization). Right Ventricle: The right ventricular size is normal. No increase in right ventricular wall thickness. Right ventricular systolic function is normal. There is moderately elevated pulmonary artery  systolic pressure. The tricuspid regurgitant velocity is 3.42 m/s, and with an assumed right atrial pressure of 8 mmHg, the estimated right ventricular systolic pressure is 11.0 mmHg. Left Atrium: Left atrial size was normal in size. Right Atrium: Right atrial size was normal in size. Pericardium: There is no evidence of pericardial effusion. Mitral Valve: The mitral valve is normal in structure and function. Trivial mitral valve regurgitation. No evidence of mitral valve stenosis. Tricuspid Valve: The tricuspid valve is grossly normal. Tricuspid valve regurgitation is trivial. No evidence of tricuspid stenosis. Aortic Valve: The aortic valve is normal in structure and function. Aortic valve regurgitation is trivial. Aortic regurgitation PHT measures 343 msec. No  aortic stenosis is present. Pulmonic Valve: The pulmonic valve was grossly normal. Pulmonic valve regurgitation is not visualized. Aorta: The aortic root, ascending aorta and aortic arch are all structurally normal, with no evidence of dilitation or obstruction. IAS/Shunts: The atrial septum is grossly normal.  LEFT VENTRICLE PLAX 2D LVIDd:         4.20 cm  Diastology LVIDs:         2.70 cm  LV e' lateral:   8.16 cm/s LV PW:         1.00 cm  LV E/e' lateral: 13.1 LV IVS:        0.90 cm  LV e' medial:    6.53 cm/s LVOT diam:     1.80 cm  LV E/e' medial:  16.4 LV SV:         75.32 ml LV SV Index:   32.28 LVOT Area:     2.54 cm  RIGHT VENTRICLE RV S prime:     10.40 cm/s TAPSE (M-mode): 2.2 cm LEFT ATRIUM             Index       RIGHT ATRIUM           Index LA diam:        2.60 cm 1.68 cm/m  RA Area:     14.20 cm LA Vol (A2C):   45.2 ml 29.23 ml/m RA Volume:   30.70 ml  19.85 ml/m LA Vol (A4C):   40.5 ml 26.19 ml/m LA Biplane Vol: 45.0 ml 29.10 ml/m  AORTIC VALVE LVOT Vmax:   120.00 cm/s LVOT Vmean:  74.000 cm/s LVOT VTI:    0.296 m AI PHT:      343 msec  AORTA Ao Root diam: 2.60 cm MITRAL VALVE                         TRICUSPID VALVE MV Area (PHT):  3.27 cm              TR Peak grad:   46.8 mmHg MV Decel Time: 232 msec              TR Vmax:        342.00 cm/s MV E velocity: 107.00 cm/s 103 cm/s MV A velocity: 112.00 cm/s 70.3 cm/s SHUNTS MV E/A ratio:  0.96        1.5       Systemic VTI:  0.30 m                                      Systemic Diam: 1.80 cm Mertie Moores MD Electronically signed by Mertie Moores MD Signature Date/Time: 05/14/2019/4:47:07 PM    Final    CT HEAD CODE STROKE WO CONTRAST  Result Date: 05/14/2019 CLINICAL DATA:  Code stroke. 74 year old female with decreased sensation on the right side. EXAM: CT HEAD WITHOUT CONTRAST TECHNIQUE: Contiguous axial images were obtained from the base of the skull through the vertex without intravenous contrast. COMPARISON:  Head CT 05/11/2019. FINDINGS: Brain: Again noted evidence of a small right frontal convexity meningioma, stable and approximately 8 mm with overlying hyperostosis (series 3, image 21). No associated frontal lobe edema. No midline shift or new intracranial mass effect. No acute intracranial hemorrhage identified. Normal cerebral volume for age. Patchy bilateral cerebral white matter and some deep gray matter hypodensity is stable from the recent  comparison. No cortically based acute infarct identified. Vascular: Calcified atherosclerosis at the skull base. No suspicious intracranial vascular hyperdensity. Skull: No acute osseous abnormality identified. Sinuses/Orbits: Chronic paranasal sinus periosteal thickening. Visualized paranasal sinuses and mastoids are stable and well pneumatized. Other: No acute orbit or scalp soft tissue findings. ASPECTS [facility] Stroke Program Early CT Score) Total score (0-10 with 10 being normal): 10 IMPRESSION: 1. Stable CT appearance of chronic small vessel disease from the recent 05/11/2019 comparison. No acute cortically based infarct or intracranial hemorrhage identified. ASPECTS 10. 2. Stable small right frontal convexity meningioma without evidence  of associated cerebral edema. 3. These results were communicated to Dr. Lorraine Lax or at 2:19 pmon 2/11/2021by text page via the [facility] messaging system. Electronically Signed   By: Genevie Ann M.D.   On: 05/14/2019 14:20   VAS US CAROTID (at [facility] and [facility] only)  Result Date: 05/15/2019 Carotid Arterial Duplex Study Indications:       CVA. Risk Factors:      Hypertension, hyperlipidemia, past history of smoking, PAD. Other Factors:     MI. CKD. Comparison Study:  08/18/2018- 60-79% right ICA stenosis. 40-59% left ICA                    stenosis. Performing Technologist: Maudry Mayhew MHA, RDMS, RVT, RDCS  Examination Guidelines: A complete evaluation includes B-mode imaging, spectral Doppler, color Doppler, and power Doppler as needed of all accessible portions of each vessel. Bilateral testing is considered an integral part of a complete examination. Limited examinations for reoccurring indications may be performed as noted.  Right Carotid Findings: +----------+-------+-------+--------+---------------------------------+--------+           PSV    EDV    StenosisPlaque Description               Comments           cm/s   cm/s                                                     +----------+-------+-------+--------+---------------------------------+--------+ CCA Prox  54     12     <50%    smooth and heterogenous                   +----------+-------+-------+--------+---------------------------------+--------+ CCA Distal38     14             heterogenous and calcific                 +----------+-------+-------+--------+---------------------------------+--------+ ICA Prox  431    110    80-99%  heterogenous, calcific and                                                irregular                                 +----------+-------+-------+--------+---------------------------------+--------+ ICA Distal119    38                                                        +----------+-------+-------+--------+---------------------------------+--------+  ECA                             heterogenous, smooth and calcific         +----------+-------+-------+--------+---------------------------------+--------+ +----------+--------+-------+----------------+-------------------+           PSV cm/sEDV cmsDescribe        Arm Pressure (mmHG) +----------+--------+-------+----------------+-------------------+ [id number]            Multiphasic, WNL                    +----------+--------+-------+----------------+-------------------+ +---------+--------+---+--------+--+---------+ VertebralPSV cm/s135EDV cm/s36Antegrade +---------+--------+---+--------+--+---------+  Left Carotid Findings: +----------+-------+-------+--------+---------------------------------+--------+           PSV    EDV    StenosisPlaque Description               Comments           cm/s   cm/s                                                     +----------+-------+-------+--------+---------------------------------+--------+ CCA Prox  120    29             smooth and heterogenous                   +----------+-------+-------+--------+---------------------------------+--------+ CCA Distal147    29                                                       +----------+-------+-------+--------+---------------------------------+--------+ ICA Prox  275    79     60-79%  heterogenous, irregular and                                               calcific                                  +----------+-------+-------+--------+---------------------------------+--------+ ICA Mid   327    79                                                       +----------+-------+-------+--------+---------------------------------+--------+ ICA Distal169    45                                                        +----------+-------+-------+--------+---------------------------------+--------+ ECA       267    64                                                       +----------+-------+-------+--------+---------------------------------+--------+ +----------+--------+--------+----------------+-------------------+  PSV cm/sEDV cm/sDescribe        Arm Pressure (mmHG) +----------+--------+--------+----------------+-------------------+ [id number]             Multiphasic, WNL                    +----------+--------+--------+----------------+-------------------+ +---------+--------+--+--------+--+---------+ VertebralPSV cm/s65EDV cm/s16Antegrade +---------+--------+--+--------+--+---------+   Summary: Right Carotid: Velocities in the right ICA are consistent with a low-range                80-99% stenosis, based on peak systolic velocities and ratio of                11.34. End diastolic ICA velocities are suggestive of upper range                60-79% stenosis. Left Carotid: Velocities in the left ICA are consistent with a 60-79% stenosis. Vertebrals:  Bilateral vertebral arteries demonstrate antegrade flow. Subclavians: Normal flow hemodynamics were seen in bilateral subclavian              arteries. *See table(s) above for measurements and observations.     Preliminary     Micro Results    Recent Results (from the past 240 hour(s))  SARS CORONAVIRUS 2 (TAT 6-24 HRS) Nasopharyngeal Nasopharyngeal Swab     Status: None   Collection Time: 05/12/19 12:58 AM   Specimen: Nasopharyngeal Swab  Result Value Ref Range Status   SARS Coronavirus 2 NEGATIVE NEGATIVE Final    Comment: (NOTE) SARS-CoV-2 target nucleic acids are NOT DETECTED. The SARS-CoV-2 RNA is generally detectable in upper and lower respiratory specimens during the acute phase of infection. Negative results do not preclude SARS-CoV-2 infection, do not rule out co-infections with other pathogens, and should not be used  as the sole basis for treatment or other patient management decisions. Negative results must be combined with clinical observations, patient history, and epidemiological information. The expected result is Negative. Fact Sheet for Patients: SugarRoll.be Fact Sheet for Healthcare Providers: https://www.woods-mathews.com/ This test is not yet approved or cleared by the [place] FDA and  has been authorized for detection and/or diagnosis of SARS-CoV-2 by FDA under an Emergency Use Authorization (EUA). This EUA will remain  in effect (meaning this test can be used) for the duration of the COVID-19 declaration under Section 56 4(b)(1) of the Act, 21 U.S.C. section 360bbb-3(b)(1), unless the authorization is terminated or revoked sooner. Performed at [facility], [place] [address], [place], [place] [address]        Today   Subjective    Kayla Mcclure today has no dizziness, no weakness, no fever or chills.         Patient has been seen and examined prior to discharge. Discussed with Dr Phillips Odor who cleared her for d/c from neurology stand-point - formal neuro notes for today pending.   Objective   Blood pressure (!) 123/59, pulse 65, temperature 98.7 F (37.1 C), temperature source Oral, resp. rate 18, height 4\' 11"  (1.499 m), weight 62.9 kg, SpO2 98 %.   Intake/Output Summary (Last 24 hours) at 05/16/2019 1618 Last data filed at 05/16/2019 1554 Gross per 24 hour  Intake 830 ml  Output --  Net 830 ml    Exam Gen:- Awake , NAD HEENT:- [place].AT, MMM Neck-Supple Neck,No JVD,  Lungs- CTAB CV- S1, S2 normal Abd-  +ve B.Sounds, Abd Soft, No tenderness,    Extremity/Skin:- Intact peripheral pulses    Data Review   CBC w Diff:  Lab  Results  Component Value Date   WBC 6.7 05/16/2019   HGB 8.2 (L) 05/16/2019   HGB 12.0 02/05/2019   HCT 28.3 (L) 05/16/2019   HCT 38.5 02/05/2019   PLT 234 05/16/2019   PLT 212  02/05/2019   LYMPHOPCT 23 07/13/2015   MONOPCT 12 07/13/2015   EOSPCT 2 07/13/2015   BASOPCT 0 07/13/2015    CMP:  Lab Results  Component Value Date   NA 141 05/15/2019   NA 140 02/05/2019   K 4.5 05/15/2019   CL 110 05/15/2019   CO2 24 05/15/2019   BUN 16 05/15/2019   BUN 12 02/05/2019   CREATININE 1.36 (H) 05/15/2019   CREATININE 1.31 (H) 05/07/2013   PROT 7.4 05/11/2019   PROT 7.1 02/05/2019   ALBUMIN 3.3 (L) 05/11/2019   ALBUMIN 4.0 02/05/2019   BILITOT 0.4 05/11/2019   BILITOT 0.8 02/05/2019   ALKPHOS 67 05/11/2019   AST 26 05/11/2019   ALT 19 05/11/2019  .   Total Discharge time is about 33 minutes  Benito Mccreedy M.D on 05/16/2019 at 4:18 PM  [facility]   Office  [phone]  Dragon dictation system was used to create this note, attempts have been made to correct errors, however presence of uncorrected errors is not a reflection quality of care provided

## 2019-05-19 ENCOUNTER — Ambulatory Visit: Payer: Medicare Other | Admitting: Neurology

## 2019-05-19 ENCOUNTER — Encounter: Payer: Self-pay | Admitting: Neurology

## 2019-05-19 ENCOUNTER — Other Ambulatory Visit: Payer: Self-pay

## 2019-05-19 VITALS — BP 168/76 | HR 79 | Temp 97.7°F | Ht 59.0 in | Wt 138.0 lb

## 2019-05-19 DIAGNOSIS — F809 Developmental disorder of speech and language, unspecified: Secondary | ICD-10-CM | POA: Diagnosis not present

## 2019-05-19 DIAGNOSIS — R202 Paresthesia of skin: Secondary | ICD-10-CM | POA: Insufficient documentation

## 2019-05-19 NOTE — Progress Notes (Signed)
PATIENT: Kayla Mcclure DOB: Jun 10, 1945  Chief Complaint  Patient presents with  . Speech Issues/Paresthesia    Reports having a delayed slurred stuttered speech and numbness in right fingers on 05/14/19. The numbness resolved the same day but her speech has just started to improve.  Marland Kitchen PCP    [name], MD     HISTORICAL  Kayla Mcclure is a 74 year old female,, seen in request by her primary care physician Dr. [name] for evaluation of speech difficulty, right hand numbness,  I have reviewed and summarized the referring note from the referring physician and hospital discharge on May 16, 2019, she had a history of hypertension, coronary artery disease, status post PTCA, chronic kidney disease, congestive heart failure, history of GI bleeding due to small bowel AVM, was admitted to the hospital on May 11, 2019 for lightheadedness, passing out spells, hemoglobin was 8.5, down from 12, creatinine was 1.32, up from 0.96, GI was consulted, had enteroscopy on May 15, 2019 after antiplatelet therapy was on hold for 5 days, proximal small bowel enteroscopy was completely normal, she was instructed to continue PPI,  Today she is brought by her grand son to clinic, alone at visit, was able to provide detailed history, no speech difficulty noted, she reported that her speech difficulty and right hand paresthesia last about 2 to 3 days, now back to baseline, complains of blurry vision now  However on February 11, she began to experience abnormal speech, staggering, word finding difficulties, right finger paresthesia, was seen by stroke team, considered with left brain stroke, versus conversion, there was no acute abnormality on MRI, however there was significant large vessel disease.  I personally reviewed MRI of the brain without contrast, no acute abnormality, chronic small vessel disease of the pons, cerebral hemisphere white matter,  CT angiogram of head and  neck, severe atherosclerotic disease at both carotid bifurcation, internal carotid artery bulb region, right internal carotid bulb at least 80% stenosis, long segment stenosis of left internal carotid artery measuring 60 to 65%, Atherosclerotic disease in both carotid siphon regions. No stenosis greater than 30% on the left. 30-50% stenosis on the right. 50% stenosis of the proximal left subclavian artery. 50% stenosis at the left vertebral artery origin, with wide patency beyond. Carotid artery showed bilateral carotid artery stenosis 60 to 79%, Laboratory evaluations, hemoglobin of 8.2, LDL of 35, A1c of 6.4,  Echocardiogram ejection fraction 60 to 65%, REVIEW OF SYSTEMS: Full 14 system review of systems performed and notable only for as above All other review of systems were negative.  ALLERGIES: No Known Allergies  HOME MEDICATIONS: Current Outpatient Medications  Medication Sig Dispense Refill  . aspirin EC 81 MG tablet Take 81 mg by mouth daily.    Marland Kitchen atorvastatin (LIPITOR) 80 MG tablet Take 80 mg by mouth daily.     . clopidogrel (PLAVIX) 75 MG tablet Take 1 tablet (75 mg total) by mouth daily. 90 tablet 3  . ezetimibe (ZETIA) 10 MG tablet Take 10 mg by mouth daily.    . ferrous gluconate (FERGON) 324 MG tablet Take 1 tablet (324 mg total) by mouth 2 (two) times daily with a meal. 60 tablet 3  . fluticasone furoate-vilanterol (BREO ELLIPTA) 100-25 MCG/INH AEPB Inhale 1 puff into the lungs daily.    Marland Kitchen gabapentin (NEURONTIN) 300 MG capsule Take 1 capsule (300 mg total) by mouth 2 (two) times daily. (Patient taking differently: Take 300 mg by mouth 2 (two) times daily as  needed (pain.). ) 60 capsule 11  . isosorbide mononitrate (IMDUR) 60 MG 24 hr tablet Take 1.5 tablets (90 mg total) by mouth daily. 135 tablet 3  . loratadine (CLARITIN) 10 MG tablet Take 10 mg by mouth daily as needed for allergies.    . metoprolol succinate (TOPROL-XL) 100 MG 24 hr tablet Take 2 tablets (200 mg total) by  mouth at bedtime. Take with or immediately following a meal. (Patient taking differently: Take 150 mg by mouth at bedtime. ) 30 tablet 11  . nitroGLYCERIN (NITROSTAT) 0.4 MG SL tablet Place 1 tablet (0.4 mg total) under the tongue every 5 (five) minutes as needed for chest pain. 25 tablet 6  . pantoprazole (PROTONIX) 40 MG tablet Take 1 tablet (40 mg total) by mouth daily. (Patient taking differently: Take 40 mg by mouth daily as needed (acid reflux/indigestion.). ) 30 tablet 0   No current facility-administered medications for this visit.    PAST MEDICAL HISTORY: Past Medical History:  Diagnosis Date  . Asthma  10/31/2007  . CAD (coronary artery disease) 08/05/2007   a) NSTEMI 2009: PCI to the RCA Promus DES 2.5 mm x 23 mm (3.0 mm). b) Myoview June 2017: Nonspecific ST changes. EF greater than 65%. LOW RISK, normal study. No ischemia or infarction.  . Carotid artery disease ([place])    a. 50-69% RICA by outpt dopp 04/22/13, f/u due 04/2014.  Marland Kitchen Chronic back pain   . Chronic diastolic heart failure ([place])   . CKD (chronic kidney disease), stage II   . Diverticulosis   04/11/2010    Colonoscopy  . Dyslipidemia, goal LDL below 70   . Essential hypertension    a. Normal renal arteries by PV angio 05/2013.  Marland Kitchen GERD (gastroesophageal reflux disease)   . GI bleed 12/2015  . History of recent blood transfusion 12/2015   12-19-15, 12-20-15 and 12-21-15  . Insomnia   . Internal hemorrhoids  04/11/2010   By colonoscopy  . Iron deficiency anemia   . Myocardial infarction ([place]) 2009  . Peripheral arterial disease ([place])    a. s/p PTA and stenting of L CIA stenosis 05/2013.  Marland Kitchen Renal insufficiency   . Tobacco abuse    Quit in 05/2013    PAST SURGICAL HISTORY: Past Surgical History:  Procedure Laterality Date  . CHOLECYSTECTOMY  1975  . COLONOSCOPY  04/11/2010  . colonscopy  11/30/2015  . CORONARY ANGIOPLASTY WITH STENT PLACEMENT  08/05/2007   Promus DES 2.5 mm x 23 mm (3 mm)  to RCA; EF 60-70%  .  ENTEROSCOPY N/A 12/27/2015   Procedure: ENTEROSCOPY;  Surgeon: Clarene Essex, MD;  Location: [facility] ENDOSCOPY;  Service: Endoscopy;  Laterality: N/A;  also needs slim egd scope  . ENTEROSCOPY N/A 05/15/2019   Procedure: ENTEROSCOPY;  Surgeon: Ronald Lobo, MD;  Location: [facility];  Service: Endoscopy;  Laterality: N/A;  Push enteroscopy using either ultraslim colonoscope or push enteroscope  . ESOPHAGOGASTRODUODENOSCOPY N/A 09/13/2013   Procedure: ESOPHAGOGASTRODUODENOSCOPY (EGD);  Surgeon: Lear Ng, MD;  Location: [facility] ENDOSCOPY;  Service: Endoscopy;  Laterality: N/A;  . ESOPHAGOGASTRODUODENOSCOPY N/A 07/14/2015   Procedure: ESOPHAGOGASTRODUODENOSCOPY (EGD);  Surgeon: Clarene Essex, MD;  Location: [facility] ENDOSCOPY;  Service: Endoscopy;  Laterality: N/A;  . gi bleed  07/2015   blood given  . HOT HEMOSTASIS N/A 12/27/2015   Procedure: HOT HEMOSTASIS (ARGON PLASMA COAGULATION/BICAP);  Surgeon: Clarene Essex, MD;  Location: [facility];  Service: Endoscopy;  Laterality: N/A;  . ILIAC ARTERY STENT Left 05/04/2013   L  CIA-EIA -- Dr. Gwenlyn Found  . LEFT HEART CATH AND CORONARY ANGIOGRAPHY N/A 02/11/2019   Procedure: LEFT HEART CATH AND CORONARY ANGIOGRAPHY;  Surgeon: Leonie Man, MD;  Location: [facility] LAB;;-  Patent RCA stent with minimal ISR after pRCA 50%).  Small caliber RPAV with ~75%.  pLAD 30%, m-dLAD 40%, D2 40%.  Normal LVEDP and LVEF.  . LOWER EXTREMITY ANGIOGRAM Bilateral 05/04/2013   Procedure: LOWER EXTREMITY ANGIOGRAM;  Surgeon: Lorretta Harp, MD;  Location: [facility] CATH LAB;  Service: Cardiovascular;  Laterality: Bilateral;  . NM MYOVIEW LTD  09/07/2015   EF > 65%. Nonspecific ST changes but no ischemic changes. No ischemia or infarction. LOW RISK  . TONSILLECTOMY  1960's  . TRANSTHORACIC ECHOCARDIOGRAM  09/21/2015   EF 60-65%. GR 1 DD. No regional wall motion analysis. No valve disease.  Marland Kitchen UPPER GI ENDOSCOPY  12/14/2015    FAMILY HISTORY: Family History  Problem Relation Age of Onset   . Diabetes Mother   . Heart disease Mother   . Heart attack Mother 69  . Breast cancer Mother 31  . Colon cancer Mother 21  . Heart attack Father 106  . Pneumonia Father   . Diabetes Sister   . Breast cancer Sister   . Breast cancer Sister 82    SOCIAL HISTORY: Social History   Socioeconomic History  . Marital status: Married    Spouse name: Not on file  . Number of children: 1  . Years of education: 69  . Highest education level: High school graduate  Occupational History    Employer: RETIRED  Tobacco Use  . Smoking status: Former Smoker    Types: Cigarettes    Quit date: 05/04/2013    Years since quitting: 6.0  . Smokeless tobacco: Never Used  Substance and Sexual Activity  . Alcohol use: Yes    Comment: occasionally 3 times a year  . Drug use: No  . Sexual activity: Never    Partners: Male    Birth control/protection: None  Other Topics Concern  . Not on file  Social History Narrative   Married. Has one child. Grandmother for a great-grandmother and 1.   No real exercise.   Occasional alcohol consumption.   Quit smoking inf Feb 2015 -- after L Iliac Stent   Right-handed   Caffeine: 1 cup every morning   Right-handed.   Lives at home with husband.   Social Determinants of Health   Financial Resource Strain:   . Difficulty of Paying Living Expenses: Not on file  Food Insecurity:   . Worried About Charity fundraiser in the Last Year: Not on file  . Ran Out of Food in the Last Year: Not on file  Transportation Needs:   . Lack of Transportation (Medical): Not on file  . Lack of Transportation (Non-Medical): Not on file  Physical Activity:   . Days of Exercise per Week: Not on file  . Minutes of Exercise per Session: Not on file  Stress:   . Feeling of Stress : Not on file  Social Connections:   . Frequency of Communication with Friends and Family: Not on file  . Frequency of Social Gatherings with Friends and Family: Not on file  . Attends Religious  Services: Not on file  . Active Member of Clubs or Organizations: Not on file  . Attends Archivist Meetings: Not on file  . Marital Status: Not on file  Intimate Partner Violence:   . Fear  of Current or Ex-Partner: Not on file  . Emotionally Abused: Not on file  . Physically Abused: Not on file  . Sexually Abused: Not on file     PHYSICAL EXAM   Vitals:   05/19/19 1542  BP: (!) 168/76  Pulse: 79  Temp: 97.7 F (36.5 C)  Weight: 138 lb (62.6 kg)  Height: 4\' 11"  (1.499 m)    Not recorded      Body mass index is 27.87 kg/m.  PHYSICAL EXAMNIATION:  Gen: NAD, conversant, well nourised, well groomed                     Cardiovascular: Regular rate rhythm, no peripheral edema, warm, nontender. Eyes: Conjunctivae clear without exudates or hemorrhage Neck: Supple, no carotid bruits. Pulmonary: Clear to auscultation bilaterally   NEUROLOGICAL EXAM:  MENTAL STATUS: Speech:    Speech is normal; fluent and spontaneous with normal comprehension.  Cognition:     Orientation to time, place and person     Normal recent and remote memory     Normal Attention span and concentration     Normal Language, naming, repeating,spontaneous speech     Fund of knowledge   CRANIAL NERVES: CN II: Visual fields are full to confrontation. Pupils are round equal and briskly reactive to light. CN III, IV, VI: extraocular movement are normal. No ptosis. CN V: Facial sensation is intact to light touch CN VII: Face is symmetric with normal eye closure  CN VIII: Hearing is normal to causal conversation. CN IX, X: Phonation is normal. CN XI: Head turning and shoulder shrug are intact  MOTOR: There is no pronator drift of out-stretched arms. Muscle bulk and tone are normal. Muscle strength is normal.  REFLEXES: Reflexes are 2+ and symmetric at the biceps, triceps, knees, and ankles. Plantar responses are flexor.  SENSORY: Intact to light touch, pinprick and vibratory sensation are  intact in fingers and toes.  COORDINATION: There is no trunk or limb dysmetria noted.  GAIT/STANCE: Posture is normal. Gait is steady with normal steps, base, arm swing, and turning. Heel and toe walking are normal. Romberg is absent.   DIAGNOSTIC DATA (LABS, IMAGING, TESTING) - I reviewed patient records, labs, notes, testing and imaging myself where available.   ASSESSMENT AND PLAN  Kayla Mcclure is a 74 y.o. female   Cerebrovascular disease  MRI of the brain showed no new stroke, supratentorium small vessel disease,  CT angiogram of head and neck showed severe atherosclerotic disease at both carotid bifurcation and ICbulb regions. Serial stenoses of the right ICA bulb at 80% or greater. Long segment stenosis of the left ICA bulb measuring 60-65% by CT. Atherosclerotic disease in both carotid siphon regions. No stenosis greater than 30% on the left. 30-50% stenosis on the right, 50% stenosis of the proximal left subclavian artery. 50% stenosis at the left vertebral artery origin, with wide patency beyond  She has vascular risk factor of aging, cigarette smoking, quitting 2015, alcohol use, coronary artery disease, peripheral vascular disease, chronic kidney disease, hypertension, goal blood pressure 130-160, giving bilateral internal carotid artery stenosis,  She  is on aspirin, Plavix now from cardiac standpoint  I will refer her to vascular surgeon for evaluation    Marcial Pacas, M.D. Ph.D.  [facility] [address], [place], [place] [address] Ph: [phone] Fax: [phone]  CC: Referring Provider

## 2019-05-25 ENCOUNTER — Other Ambulatory Visit: Payer: Self-pay | Admitting: *Deleted

## 2019-05-25 NOTE — Patient Outreach (Signed)
[facility]) Care Management  05/25/2019  [name] 1946/01/04 [id number]   RED ON EMMI ALERT - Stroke Day # 6 Date: 2/20 Red Alert Reason: Smoked or been around smoke   Outreach attempt #1, successful, identity verified.  This care manager introduced self and stated purpose of call, [facility] care management services explained.  She report living with her husband but has her son available to help with management of care.  Also has granddaughter that lives next door to her.  Reviewed stroke education, particularly smoking, however member report it is still unclear if she actually had a stroke.  State after some testing, there was no evidence of stroke but she does have blocked carotid arteries.  She is waiting of a call from the surgeon office to discuss surgical options.  She also has appointment with cardiology on 2/24.  Report her son will provide transportation to appointments, otherwise she is independent in all ADL's.  She verbalizes understanding regarding risk of stroke with blocked carotids and increased risk with smoking and being around smoke.  She will continue to decrease risk factors, denies any urgent concerns at this time.   Plan: RN CM will not open case at this time.  Advised to contact this care manager with any concerns.  Valente David, [place], MSN [place] [phone]

## 2019-05-26 NOTE — Progress Notes (Signed)
Cardiology Office Note   Date:  05/27/2019   ID:  Kayla Mcclure, DOB 03/19/1946, MRN [id number]  PCP:  [name], MD  Cardiologist:  Dr. Ellyn Hack   CC: Hospital follow up.   History of Present Illness: Kayla Mcclure is a 74 y.o. female who presents for ongoing assessment and management of coronary artery disease, history of non-STEMI in 2009 with DES PCI to the RCA, repeat cardiac catheterization in 2020 revealing patent RCA stent with mild in-stent restenosis.  RP AV 75% small caliber vessel minimum LAD, left circumflex disease.  Also had PAD with lifestyle limiting claudication status post PTCA of the left common and external iliac artery.  Moderate bilateral internal carotid disease.  She is being followed by Dr. Gwenlyn Found.  Hyperlipidemia, hypertension, and chronic dyspnea on exertion.  When last seen by Dr. Ellyn Hack on 02/24/2019 she was stable from a cardiac standpoint, her dyspnea had improved.  Amlodipine was reduced to 5 mg daily and isosorbide was reduced to 60 mg daily.  She is here for follow-up to evaluate her current cardiac status.  She was recently admitted after experiencing a syncopal episode.  She actually experienced it 1 week before being seen in the ED.  She continued to have dizziness and lightheadedness with position change.  She was found to be anemic with a hemoglobin of 8.5, creatinine was 1.32.  She did begin to experience some abnormal speech, stuttering and word finding difficulty.  A code stroke was called and MRI failed to demonstrate acute CVA.  Carotid Doppler ultrasound revealed significant right internal carotid artery stenosis, with low range 80% to 99% stenosis.  Left ICA were consistent with 60 to 79% stenosis.  She was continued on aspirin and Plavix along with statin therapy.  Diuretics were discontinued, hydralazie HCTZ, and amlodipine were also discontinued.  She also had a GI consultation which did not reveal any active bleeding, small bowel  enteroscopy was also found to be completely normal other than small hiatal hernia and Schatzki's ring.  She was found to have iron deficiency anemia and continued on iron supplements.      Past Medical History:  Diagnosis Date  . Asthma  10/31/2007  . CAD (coronary artery disease) 08/05/2007   a) NSTEMI 2009: PCI to the RCA Promus DES 2.5 mm x 23 mm (3.0 mm). b) Myoview June 2017: Nonspecific ST changes. EF greater than 65%. LOW RISK, normal study. No ischemia or infarction.  . Carotid artery disease ([place])    a. 50-69% RICA by outpt dopp 04/22/13, f/u due 04/2014.  Marland Kitchen Chronic back pain   . Chronic diastolic heart failure ([place])   . CKD (chronic kidney disease), stage II   . Diverticulosis   04/11/2010    Colonoscopy  . Dyslipidemia, goal LDL below 70   . Essential hypertension    a. Normal renal arteries by PV angio 05/2013.  Marland Kitchen GERD (gastroesophageal reflux disease)   . GI bleed 12/2015  . History of recent blood transfusion 12/2015   12-19-15, 12-20-15 and 12-21-15  . Insomnia   . Internal hemorrhoids  04/11/2010   By colonoscopy  . Iron deficiency anemia   . Myocardial infarction ([place]) 2009  . Peripheral arterial disease ([place])    a. s/p PTA and stenting of L CIA stenosis 05/2013.  Marland Kitchen Renal insufficiency   . Tobacco abuse    Quit in 05/2013    Past Surgical History:  Procedure Laterality Date  . CHOLECYSTECTOMY  1975  . COLONOSCOPY  04/11/2010  . colonscopy  11/30/2015  . CORONARY ANGIOPLASTY WITH STENT PLACEMENT  08/05/2007   Promus DES 2.5 mm x 23 mm (3 mm)  to RCA; EF 60-70%  . ENTEROSCOPY N/A 12/27/2015   Procedure: ENTEROSCOPY;  Surgeon: Clarene Essex, MD;  Location: [facility] ENDOSCOPY;  Service: Endoscopy;  Laterality: N/A;  also needs slim egd scope  . ENTEROSCOPY N/A 05/15/2019   Procedure: ENTEROSCOPY;  Surgeon: Ronald Lobo, MD;  Location: [place];  Service: Endoscopy;  Laterality: N/A;  Push enteroscopy using either ultraslim colonoscope or push enteroscope  .  ESOPHAGOGASTRODUODENOSCOPY N/A 09/13/2013   Procedure: ESOPHAGOGASTRODUODENOSCOPY (EGD);  Surgeon: Lear Ng, MD;  Location: [facility] ENDOSCOPY;  Service: Endoscopy;  Laterality: N/A;  . ESOPHAGOGASTRODUODENOSCOPY N/A 07/14/2015   Procedure: ESOPHAGOGASTRODUODENOSCOPY (EGD);  Surgeon: Clarene Essex, MD;  Location: [facility] ENDOSCOPY;  Service: Endoscopy;  Laterality: N/A;  . gi bleed  07/2015   blood given  . HOT HEMOSTASIS N/A 12/27/2015   Procedure: HOT HEMOSTASIS (ARGON PLASMA COAGULATION/BICAP);  Surgeon: Clarene Essex, MD;  Location: [facility] ENDOSCOPY;  Service: Endoscopy;  Laterality: N/A;  . ILIAC ARTERY STENT Left 05/04/2013   L CIA-EIA -- Dr. Gwenlyn Found  . LEFT HEART CATH AND CORONARY ANGIOGRAPHY N/A 02/11/2019   Procedure: LEFT HEART CATH AND CORONARY ANGIOGRAPHY;  Surgeon: Leonie Man, MD;  Location: [place] CV LAB;;-  Patent RCA stent with minimal ISR after pRCA 50%).  Small caliber RPAV with ~75%.  pLAD 30%, m-dLAD 40%, D2 40%.  Normal LVEDP and LVEF.  . LOWER EXTREMITY ANGIOGRAM Bilateral 05/04/2013   Procedure: LOWER EXTREMITY ANGIOGRAM;  Surgeon: Lorretta Harp, MD;  Location[facility] CATH LAB;  Service: Cardiovascular;  Laterality: Bilateral;  . NM MYOVIEW LTD  09/07/2015   EF > 65%. Nonspecific ST changes but no ischemic changes. No ischemia or infarction. LOW RISK  . TONSILLECTOMY  1960's  . TRANSTHORACIC ECHOCARDIOGRAM  09/21/2015   EF 60-65%. GR 1 DD. No regional wall motion analysis. No valve disease.  Marland Kitchen UPPER GI ENDOSCOPY  12/14/2015     Current Outpatient Medications  Medication Sig Dispense Refill  . aspirin EC 81 MG tablet Take 81 mg by mouth daily.    Marland Kitchen atorvastatin (LIPITOR) 80 MG tablet Take 1 tablet (80 mg total) by mouth daily. 90 tablet 3  . clopidogrel (PLAVIX) 75 MG tablet Take 1 tablet (75 mg total) by mouth daily. 90 tablet 3  . ezetimibe (ZETIA) 10 MG tablet Take 1 tablet (10 mg total) by mouth daily. 90 tablet 3  . ferrous gluconate (FERGON) 324 MG tablet Take 1 tablet  (324 mg total) by mouth 2 (two) times daily with a meal. 60 tablet 3  . fluticasone furoate-vilanterol (BREO ELLIPTA) 100-25 MCG/INH AEPB Inhale 1 puff into the lungs daily.    Marland Kitchen gabapentin (NEURONTIN) 300 MG capsule Take 1 capsule (300 mg total) by mouth 2 (two) times daily. (Patient taking differently: Take 300 mg by mouth 2 (two) times daily as needed (pain.). ) 60 capsule 11  . isosorbide mononitrate (IMDUR) 60 MG 24 hr tablet Take 1.5 tablets (90 mg total) by mouth daily. 135 tablet 3  . loratadine (CLARITIN) 10 MG tablet Take 10 mg by mouth daily as needed for allergies.    . metoprolol succinate (TOPROL-XL) 100 MG 24 hr tablet Take 2 tablets (200 mg total) by mouth at bedtime. Take with or immediately following a meal. 180 tablet 3  . nitroGLYCERIN (NITROSTAT) 0.4 MG SL tablet Place 1 tablet (0.4 mg total) under  the tongue every 5 (five) minutes as needed for chest pain. 25 tablet 6  . pantoprazole (PROTONIX) 40 MG tablet Take 1 tablet (40 mg total) by mouth daily. (Patient taking differently: Take 40 mg by mouth daily as needed (acid reflux/indigestion.). ) 30 tablet 0   No current facility-administered medications for this visit.    Allergies:   Patient has no known allergies.    Social History:  The patient  reports that she quit smoking about 6 years ago. Her smoking use included cigarettes. She has never used smokeless tobacco. She reports current alcohol use. She reports that she does not use drugs.   Family History:  The patient's family history includes Breast cancer in her sister; Breast cancer (age of onset: 47) in her sister; Breast cancer (age of onset: 30) in her mother; Colon cancer (age of onset: 53) in her mother; Diabetes in her mother and sister; Heart attack (age of onset: 38) in her father; Heart attack (age of onset: 53) in her mother; Heart disease in her mother; Pneumonia in her father.    ROS: All other systems are reviewed and negative. Unless otherwise mentioned  in H&P    PHYSICAL EXAM: VS:  BP (!) 162/90   Pulse 81   Ht 4\' 11"  (1.499 m)   Wt 137 lb (62.1 kg)   SpO2 98%   BMI 27.67 kg/m  , BMI Body mass index is 27.67 kg/m. GEN: Well nourished, well developed, in no acute distress HEENT: normal Neck: no JVD, soft right carotid bruit, none on the left, or masses Cardiac: RRR; no murmurs, rubs, or gallops,no edema  Respiratory:  Clear to auscultation bilaterally, normal work of breathing GI: soft, nontender, nondistended, + BS MS: no deformity or atrophy Skin: warm and dry, no rash Neuro:  Strength and sensation are intact Psych: euthymic mood, full affect   EKG: Not completed this office visit.  I have personally reviewed hospitalization EKG revealing sinus rhythm with right axis deviation heart rate of 88 bpm.  Recent Labs: 05/11/2019: ALT 19 05/15/2019: BUN 16; Creatinine, Ser 1.36; Potassium 4.5; Sodium 141 05/16/2019: Hemoglobin 8.2; Platelets 234    Lipid Panel    Component Value Date/Time   CHOL 91 05/15/2019 0324   CHOL 111 02/05/2019 0836   TRIG 55 05/15/2019 0324   HDL 45 05/15/2019 0324   HDL 57 02/05/2019 0836   CHOLHDL 2.0 05/15/2019 0324   VLDL 11 05/15/2019 0324   LDLCALC 35 05/15/2019 0324   LDLCALC 39 02/05/2019 0836      Wt Readings from Last 3 Encounters:  05/27/19 137 lb (62.1 kg)  05/19/19 138 lb (62.6 kg)  05/16/19 138 lb 10.7 oz (62.9 kg)      Other studies Reviewed:  Carotid ultrasound Summary: 05/15/2019 Right Carotid: Velocities in the right ICA are consistent with a low-range  80-99% stenosis, based on peak systolic velocities and  ratio of 11.34. End diastolic ICA velocities are suggestive of upper  range  60-79% stenosis.   Left Carotid: Velocities in the left ICA are consistent with a 60-79%  stenosis.   Vertebrals: Bilateral vertebral arteries demonstrate antegrade flow.  Subclavians: Normal flow hemodynamics were seen in bilateral subclavian        arteries.   CAD: NSTEMI  (2009) and DES PCI to RCA; - June 2017 nonischemic Myoview, normal EF ? In November 2020: Patent RCA stent with mild ISR.  RPAV 75%, small caliber vessel, minimal LAD/LCx disease.  PAD: February 2015-lifestyle limiting claudication  PTCA left common and external iliac iliac.-LAA Dopplers ABIs.  No claudication ? Moderate bilateral internal carotid disease: Carotid Dopplers May 2020 moderate-severe right and moderate left ICA disease. ? Seen in September by Dr. Gwenlyn Found ([place]) -she remained stable from a PV standpoint.  Stable Dopplers noted.  ASSESSMENT AND PLAN:  1.  Syncopal episode: Multifactorial in the setting of anemia, carotid artery disease, with significant right ICA stenosis per Doppler ultrasound, and hypotension.  Medications have been adjusted to remove diuretics and amlodipine.  She has had no further syncopal episodes but does have some lightheadedness with position change.  2.  Carotid artery disease: Doppler ultrasound as above.  She is being referred to vein and vascular surgery with planned appointment in March 2021.  We called from the office today to make the appointment.  She will continue aspirin and Plavix therapy along with statin therapy.  3.  Coronary artery disease: History of DES to the RCA with mild in-stent restenosis noted per cath in November 2020.  Minimal LAD and left circumflex disease.  Continue secondary prevention  4.  PAD: Mild lifestyle limiting claudication, history of PTCA of the left common and external iliac artery.  No further complaints at this time.  She is a little unsteady on her feet but is able to walk without assistance.  5.  Hyperlipidemia: Continue statin therapy with atorvastatin 80 mg daily.  Goal of LDL less than 70.  Most recent labs on 05/15/2019 LDL was 35.  Will not lower dose of atorvastatin in the setting of R ICA disease.  6.  Hypertension: Blood pressure is elevated today but she has not taken her medications prior to coming into the  office.  She is advised to take her medications each day prior to her appointments.  She verbalizes understanding.   Current medicines are reviewed at length with the patient today.  I have spent 40 minutes  dedicated to the care of this patient on the date of this encounter to include pre-visit review of records, assessment, management and diagnostic testing,with shared decision making.  Labs/ tests ordered today include: None Phill Myron. West Pugh, ANP, [facility]   05/27/2019 10:24 AM    [facility] [place] [address] Office [phone] Fax [phone]  Notice: This dictation was prepared with Dragon dictation along with smaller phrase technology. Any transcriptional errors that result from this process are unintentional and may not be corrected upon review.

## 2019-05-27 ENCOUNTER — Encounter: Payer: Self-pay | Admitting: Adult Health

## 2019-05-27 ENCOUNTER — Ambulatory Visit: Payer: Medicare Other | Admitting: Adult Health

## 2019-05-27 ENCOUNTER — Other Ambulatory Visit: Payer: Self-pay

## 2019-05-27 VITALS — BP 162/90 | HR 81 | Ht 59.0 in | Wt 137.0 lb

## 2019-05-27 DIAGNOSIS — I739 Peripheral vascular disease, unspecified: Secondary | ICD-10-CM

## 2019-05-27 DIAGNOSIS — I6523 Occlusion and stenosis of bilateral carotid arteries: Secondary | ICD-10-CM

## 2019-05-27 DIAGNOSIS — E785 Hyperlipidemia, unspecified: Secondary | ICD-10-CM | POA: Diagnosis not present

## 2019-05-27 DIAGNOSIS — I209 Angina pectoris, unspecified: Secondary | ICD-10-CM | POA: Diagnosis not present

## 2019-05-27 DIAGNOSIS — Z9861 Coronary angioplasty status: Secondary | ICD-10-CM

## 2019-05-27 DIAGNOSIS — I2510 Atherosclerotic heart disease of native coronary artery without angina pectoris: Secondary | ICD-10-CM

## 2019-05-27 DIAGNOSIS — I10 Essential (primary) hypertension: Secondary | ICD-10-CM

## 2019-05-27 MED ORDER — ISOSORBIDE MONONITRATE ER 60 MG PO TB24: 90.0000 mg | ORAL_TABLET | Freq: Every day | ORAL | 3 refills | Status: DC

## 2019-05-27 MED ORDER — CLOPIDOGREL BISULFATE 75 MG PO TABS: 75.0000 mg | ORAL_TABLET | Freq: Every day | ORAL | 3 refills | Status: DC

## 2019-05-27 MED ORDER — METOPROLOL SUCCINATE ER 100 MG PO TB24: 200.0000 mg | ORAL_TABLET | Freq: Every day | ORAL | 3 refills | Status: DC

## 2019-05-27 MED ORDER — EZETIMIBE 10 MG PO TABS: 10.0000 mg | ORAL_TABLET | Freq: Every day | ORAL | 3 refills | Status: AC

## 2019-05-27 MED ORDER — ATORVASTATIN CALCIUM 80 MG PO TABS: 80.0000 mg | ORAL_TABLET | Freq: Every day | ORAL | 3 refills | Status: AC

## 2019-05-27 NOTE — Patient Instructions (Signed)
Medication Instructions:  Continue current medications  *If you need a refill on your cardiac medications before your next appointment, please call your pharmacy*  Lab Work: None Ordered  Testing/Procedures: None Ordered  Follow-Up: At [facility], you and your health needs are our priority.  As part of our continuing mission to provide you with exceptional heart care, we have created designated Provider Care Teams.  These Care Teams include your primary Cardiologist (physician) and Advanced Practice Providers (APPs -  Physician Assistants and Nurse Practitioners) who all work together to provide you with the care you need, when you need it.  Your next appointment:   3 month(s)  The format for your next appointment:   In Person  Provider:   [name], MD

## 2019-06-01 ENCOUNTER — Other Ambulatory Visit: Payer: Self-pay | Admitting: *Deleted

## 2019-06-01 MED ORDER — METOPROLOL SUCCINATE ER 100 MG PO TB24: 200.0000 mg | ORAL_TABLET | Freq: Every day | ORAL | 3 refills | Status: DC

## 2019-06-01 NOTE — Telephone Encounter (Signed)
Rx has been sent to the pharmacy electronically. ° °

## 2019-06-01 NOTE — Patient Outreach (Signed)
[facility]) Care Management  06/01/2019  Kayla Mcclure Needs Mar 13, 1946 [id number]   RED ON EMMI ALERT - Stroke Day # 13 Date: 2/27 Red Alert Reason: No follow up scheduled, didn't go to follow up   Outreach attempt #1, successful.  Call was placed to member for above noted reason.  Identity verified, this care manager introduced self and stated purpose of call.  She is familiar as call was also placed last week for a red EMMI alert.  She report she is doing well, confirms that she has had follow up appointments with both neurology and cardiology.  She has not scheduled appointment with primary MD but will call today to schedule.  She also has visit scheduled with vascular surgeon on 3/26.  Denies any concerns or needs at this time, report again that she has the support of her family/friends if needed.   Plan: RN CM will not open case at this time as no further needs identified.  Valente David, [place], MSN [place] [phone]

## 2019-06-25 ENCOUNTER — Telehealth (HOSPITAL_COMMUNITY): Payer: Self-pay

## 2019-06-25 ENCOUNTER — Other Ambulatory Visit: Payer: Self-pay | Admitting: *Deleted

## 2019-06-25 DIAGNOSIS — I6523 Occlusion and stenosis of bilateral carotid arteries: Secondary | ICD-10-CM

## 2019-06-25 NOTE — Telephone Encounter (Signed)

## 2019-06-26 ENCOUNTER — Ambulatory Visit (INDEPENDENT_AMBULATORY_CARE_PROVIDER_SITE_OTHER): Payer: Medicare Other | Admitting: Vascular Surgery

## 2019-06-26 ENCOUNTER — Other Ambulatory Visit: Payer: Self-pay

## 2019-06-26 ENCOUNTER — Ambulatory Visit (HOSPITAL_COMMUNITY)
Admission: RE | Admit: 2019-06-26 | Discharge: 2019-06-26 | Disposition: A | Discharge status: Home / Self Care | Payer: Medicare Other | Source: Ambulatory Visit | Attending: Vascular Surgery | Admitting: Vascular Surgery

## 2019-06-26 ENCOUNTER — Encounter: Payer: Self-pay | Admitting: Vascular Surgery

## 2019-06-26 VITALS — BP 159/71 | HR 65 | Temp 97.4°F | Resp 16 | Ht 59.0 in | Wt 135.0 lb

## 2019-06-26 DIAGNOSIS — I6523 Occlusion and stenosis of bilateral carotid arteries: Secondary | ICD-10-CM

## 2019-06-26 NOTE — Progress Notes (Signed)
Patient ID: Kayla Mcclure, female   DOB: 10-23-1945, 74 y.o.   MRN: [id number]  Reason for Consult: New Patient (Initial Visit) (carotid stenosis)   Referred by Marcial Pacas, MD  Subjective:     HPI:  Kayla Mcclure is a 74 y.o. female recent hospitalization for GI bleed for small bowel arteriovenous malformation.  During that time she was found to have slurring of her speech with right hand paresthesias that lasted 2 to 3 days.  Her MRI was negative.  She underwent carotid duplex as well as CT angio of her head and neck.  She has followed up with neurology.  She has no further symptoms.  She continues on aspirin, Plavix, statin.  She has never had neck surgery.  No history of vascular disease.  She does continue to smoke occasionally.  Previous coronary disease with stent in 2009.  Past Medical History:  Diagnosis Date  . Asthma  10/31/2007  . CAD (coronary artery disease) 08/05/2007   a) NSTEMI 2009: PCI to the RCA Promus DES 2.5 mm x 23 mm (3.0 mm). b) Myoview June 2017: Nonspecific ST changes. EF greater than 65%. LOW RISK, normal study. No ischemia or infarction.  . Carotid artery disease ([place])    a. 50-69% RICA by outpt dopp 04/22/13, f/u due 04/2014.  Marland Kitchen Chronic back pain   . Chronic diastolic heart failure ([place])   . CKD (chronic kidney disease), stage II   . Diverticulosis   04/11/2010    Colonoscopy  . Dyslipidemia, goal LDL below 70   . Essential hypertension    a. Normal renal arteries by PV angio 05/2013.  Marland Kitchen GERD (gastroesophageal reflux disease)   . GI bleed 12/2015  . History of recent blood transfusion 12/2015   12-19-15, 12-20-15 and 12-21-15  . Insomnia   . Internal hemorrhoids  04/11/2010   By colonoscopy  . Iron deficiency anemia   . Myocardial infarction ([place]) 2009  . Peripheral arterial disease ([place])    a. s/p PTA and stenting of L CIA stenosis 05/2013.  Marland Kitchen Renal insufficiency   . Tobacco abuse    Quit in 05/2013   Family History  Problem Relation  Age of Onset  . Diabetes Mother   . Heart disease Mother   . Heart attack Mother 41  . Breast cancer Mother 39  . Colon cancer Mother 32  . Heart attack Father 19  . Pneumonia Father   . Diabetes Sister   . Breast cancer Sister   . Breast cancer Sister 19   Past Surgical History:  Procedure Laterality Date  . CHOLECYSTECTOMY  1975  . COLONOSCOPY  04/11/2010  . colonscopy  11/30/2015  . CORONARY ANGIOPLASTY WITH STENT PLACEMENT  08/05/2007   Promus DES 2.5 mm x 23 mm (3 mm)  to RCA; EF 60-70%  . ENTEROSCOPY N/A 12/27/2015   Procedure: ENTEROSCOPY;  Surgeon: Clarene Essex, MD;  Location: [facility] ENDOSCOPY;  Service: Endoscopy;  Laterality: N/A;  also needs slim egd scope  . ENTEROSCOPY N/A 05/15/2019   Procedure: ENTEROSCOPY;  Surgeon: Ronald Lobo, MD;  Location: [place];  Service: Endoscopy;  Laterality: N/A;  Push enteroscopy using either ultraslim colonoscope or push enteroscope  . ESOPHAGOGASTRODUODENOSCOPY N/A 09/13/2013   Procedure: ESOPHAGOGASTRODUODENOSCOPY (EGD);  Surgeon: Lear Ng, MD;  Location: [facility] ENDOSCOPY;  Service: Endoscopy;  Laterality: N/A;  . ESOPHAGOGASTRODUODENOSCOPY N/A 07/14/2015   Procedure: ESOPHAGOGASTRODUODENOSCOPY (EGD);  Surgeon: Clarene Essex, MD;  Location: [facility] ENDOSCOPY;  Service: Endoscopy;  Laterality: N/A;  .  gi bleed  07/2015   blood given  . HOT HEMOSTASIS N/A 12/27/2015   Procedure: HOT HEMOSTASIS (ARGON PLASMA COAGULATION/BICAP);  Surgeon: Clarene Essex, MD;  Location: [facility] ENDOSCOPY;  Service: Endoscopy;  Laterality: N/A;  . ILIAC ARTERY STENT Left 05/04/2013   L CIA-EIA -- Dr. Gwenlyn Found  . LEFT HEART CATH AND CORONARY ANGIOGRAPHY N/A 02/11/2019   Procedure: LEFT HEART CATH AND CORONARY ANGIOGRAPHY;  Surgeon: Leonie Man, MD;  Location: [place] CV LAB;;-  Patent RCA stent with minimal ISR after pRCA 50%).  Small caliber RPAV with ~75%.  pLAD 30%, m-dLAD 40%, D2 40%.  Normal LVEDP and LVEF.  . LOWER EXTREMITY ANGIOGRAM Bilateral 05/04/2013    Procedure: LOWER EXTREMITY ANGIOGRAM;  Surgeon: Lorretta Harp, MD;  Location: [facility] CATH LAB;  Service: Cardiovascular;  Laterality: Bilateral;  . NM MYOVIEW LTD  09/07/2015   EF > 65%. Nonspecific ST changes but no ischemic changes. No ischemia or infarction. LOW RISK  . TONSILLECTOMY  1960's  . TRANSTHORACIC ECHOCARDIOGRAM  09/21/2015   EF 60-65%. GR 1 DD. No regional wall motion analysis. No valve disease.  Marland Kitchen UPPER GI ENDOSCOPY  12/14/2015    Short Social History:  Social History   Tobacco Use  . Smoking status: Former Smoker    Types: Cigarettes    Quit date: 05/04/2013    Years since quitting: 6.1  . Smokeless tobacco: Never Used  Substance Use Topics  . Alcohol use: Yes    Comment: occasionally 3 times a year    No Known Allergies  Current Outpatient Medications  Medication Sig Dispense Refill  . aspirin EC 81 MG tablet Take 81 mg by mouth daily.    Marland Kitchen atorvastatin (LIPITOR) 80 MG tablet Take 1 tablet (80 mg total) by mouth daily. 90 tablet 3  . clopidogrel (PLAVIX) 75 MG tablet Take 1 tablet (75 mg total) by mouth daily. 90 tablet 3  . ezetimibe (ZETIA) 10 MG tablet Take 1 tablet (10 mg total) by mouth daily. 90 tablet 3  . ferrous gluconate (FERGON) 324 MG tablet Take 1 tablet (324 mg total) by mouth 2 (two) times daily with a meal. 60 tablet 3  . fluticasone furoate-vilanterol (BREO ELLIPTA) 100-25 MCG/INH AEPB Inhale 1 puff into the lungs daily.    Marland Kitchen gabapentin (NEURONTIN) 300 MG capsule Take 1 capsule (300 mg total) by mouth 2 (two) times daily. (Patient taking differently: Take 300 mg by mouth 2 (two) times daily as needed (pain.). ) 60 capsule 11  . isosorbide mononitrate (IMDUR) 60 MG 24 hr tablet Take 1.5 tablets (90 mg total) by mouth daily. 135 tablet 3  . loratadine (CLARITIN) 10 MG tablet Take 10 mg by mouth daily as needed for allergies.    . metoprolol succinate (TOPROL-XL) 100 MG 24 hr tablet Take 2 tablets (200 mg total) by mouth at bedtime. 180 tablet 3  .  nitroGLYCERIN (NITROSTAT) 0.4 MG SL tablet Place 1 tablet (0.4 mg total) under the tongue every 5 (five) minutes as needed for chest pain. 25 tablet 6  . pantoprazole (PROTONIX) 40 MG tablet Take 1 tablet (40 mg total) by mouth daily. (Patient taking differently: Take 40 mg by mouth daily as needed (acid reflux/indigestion.). ) 30 tablet 0   No current facility-administered medications for this visit.    Review of Systems  Constitutional:  Constitutional negative. HENT: HENT negative.  Eyes: Eyes negative.  Cardiovascular: Cardiovascular negative.  GI: Positive for blood in stool.  Musculoskeletal: Musculoskeletal negative.  Skin:  Skin negative.  Neurological: Positive for focal weakness, numbness and speech difficulty.  Hematologic: Hematologic/lymphatic negative.  Psychiatric: Psychiatric negative.        Objective:  Objective   Vitals:   06/26/19 1206 06/26/19 1209  BP: (!) 141/68 (!) 159/71  Pulse: 68 65  Resp: 16   Temp: (!) 97.4 F (36.3 C)   TempSrc: Temporal   SpO2: 97%   Weight: 135 lb (61.2 kg)   Height: 4\' 11"  (1.499 m)    Body mass index is 27.27 kg/m.  Physical Exam HENT:     Head: Normocephalic.     Nose: Nose normal.  Eyes:     Pupils: Pupils are equal, round, and reactive to light.  Neck:     Vascular: Carotid bruit present.     Comments: Bilateral carotid bruit Cardiovascular:     Pulses: Normal pulses.  Pulmonary:     Effort: Pulmonary effort is normal.  Abdominal:     General: Abdomen is flat.     Palpations: Abdomen is soft. There is no mass.  Musculoskeletal:        General: No swelling. Normal range of motion.  Skin:    General: Skin is warm.     Capillary Refill: Capillary refill takes less than 2 seconds.  Neurological:     General: No focal deficit present.     Mental Status: She is alert.  Psychiatric:        Mood and Affect: Mood normal.        Behavior: Behavior normal.        Thought Content: Thought content normal.      Data: I have independently interpreted her carotid duplex she demonstrates 80 to 99% stenosis on the right and 60 to 79% stenosis on the left.     Assessment/Plan:     74 yo female with bilateral high-grade stenosis of her internal carotid arteries.  I reviewed her CT scan with her appears to be high-grade stenosis on the right.  Both lesions are heavily calcified.  Left lesion was questionably symptomatic.  She continues on aspirin, Plavix, statin.  Given the high calcification burden would not recommend stenting.  I discussed with the patient for the left side medical management versus endarterectomy we will proceed with this in the near future given that it was a questionably symptomatic lesion.  She will need a right carotid endarterectomy subsequent to recovery from the left side.  We discussed the risk of stroke, myocardial infarction, cranial nerve injury, hematoma and other unforeseen imponderables and she demonstrates good understanding we will get her scheduled today.     Waynetta Sandy MD [facility]

## 2019-06-26 NOTE — Progress Notes (Signed)
a 

## 2019-07-06 ENCOUNTER — Ambulatory Visit: Payer: Medicare Other

## 2019-07-14 NOTE — Progress Notes (Signed)
[place] ([place]), [place] - [address]  ([place]) Kayla Mcclure [address] Phone: [phone] Fax: [place] [facility] [address] [place] [address] [place] [address] Phone: [phone] Fax: [phone]    Your procedure is scheduled on Tuesday, April 27th.  Report to [facility] Main Entrance "A" at 5:30 A.M., and check in at the Admitting office.  Call this number if you have problems the morning of surgery:  [phone]  Call [phone] if you have any questions prior to your surgery date Monday-Friday 8am-4pm   Remember:  Do not eat or drink after midnight the night before your surgery   Take these medicines the morning of surgery with A SIP OF WATER atorvastatin (LIPITOR) ezetimibe (ZETIA) fluticasone furoate-vilanterol (BREO ELLIPTA)/ inhaler isosorbide mononitrate (IMDUR) pantoprazole (PROTONIX)   If needed - albuterol (VENTOLIN HFA)/inhaler, nitroGLYCERIN (NITROSTAT)     Follow your surgeon's instructions on when to stop Aspirin and clopidogrel (PLAVIX).  If no instructions were given by your surgeon then you will need to call the office to get those instructions.    As of today, STOP taking any Aspirin (unless otherwise instructed by your surgeon) and Aspirin containing products, Aleve, Naproxen, Ibuprofen, Motrin, Advil, Goody's, BC's, all herbal medications, fish oil, and all vitamins.                     Do not wear jewelry, make up, or nail polish            Do not wear lotions, powders, perfumes, or deodorant.            Do not shave 48 hours prior to surgery.              Do not bring valuables to the hospital.            [facility] is not responsible for any belongings or valuables.  Do NOT Smoke (Tobacco/Vapping) or drink Alcohol 24 hours prior to your procedure If you use a CPAP at night, you may bring all equipment for your overnight stay.    Contacts, glasses, dentures or bridgework may not be worn into surgery.      For patients admitted to the hospital, discharge time will be determined by your treatment team.   Patients discharged the day of surgery will not be allowed to drive home, and someone needs to stay with them for 24 hours.  Special instructions:   [place]- Preparing For Surgery  Before surgery, you can play an important role. Because skin is not sterile, your skin needs to be as free of germs as possible. You can reduce the number of germs on your skin by washing with CHG (chlorahexidine gluconate) Soap before surgery.  CHG is an antiseptic cleaner which kills germs and bonds with the skin to continue killing germs even after washing.    Oral Hygiene is also important to reduce your risk of infection.  Remember - BRUSH YOUR TEETH THE MORNING OF SURGERY WITH YOUR REGULAR TOOTHPASTE  Please do not use if you have an allergy to CHG or antibacterial soaps. If your skin becomes reddened/irritated stop using the CHG.  Do not shave (including legs and underarms) for at least 48 hours prior to first CHG shower. It is OK to shave your face.  Please follow these instructions carefully.   1. Shower the NIGHT BEFORE SURGERY and the MORNING OF  SURGERY with CHG Soap.   2. If you chose to wash your hair, wash your hair first as usual with your normal shampoo.  3. After you shampoo, rinse your hair and body thoroughly to remove the shampoo.  4. Use CHG as you would any other liquid soap. You can apply CHG directly to the skin and wash gently with a scrungie or a clean washcloth.   5. Apply the CHG Soap to your body ONLY FROM THE NECK DOWN.  Do not use on open wounds or open sores. Avoid contact with your eyes, ears, mouth and genitals (private parts). Wash Face and genitals (private parts)  with your normal soap.   6. Wash thoroughly, paying special attention to the area where your surgery will be  performed.  7. Thoroughly rinse your body with warm water from the neck down.  8. DO NOT shower/wash with your normal soap after using and rinsing off the CHG Soap.  9. Pat yourself dry with a CLEAN TOWEL.  10. Wear CLEAN PAJAMAS to bed the night before surgery, wear comfortable clothes the morning of surgery  11. Place CLEAN SHEETS on your bed the night of your first shower and DO NOT SLEEP WITH PETS. Day of Surgery: Do not apply any deodorants/lotions.  Please wear clean clothes to the hospital/surgery center.   Remember to brush your teeth WITH YOUR REGULAR TOOTHPASTE.   Please read over the following fact sheets that you were given.

## 2019-07-15 ENCOUNTER — Other Ambulatory Visit: Payer: Self-pay

## 2019-07-15 ENCOUNTER — Encounter (HOSPITAL_COMMUNITY)
Admission: RE | Admit: 2019-07-15 | Discharge: 2019-07-15 | Disposition: A | Discharge status: Home / Self Care | Payer: Medicare Other | Source: Ambulatory Visit | Attending: Vascular Surgery | Admitting: Vascular Surgery

## 2019-07-15 ENCOUNTER — Encounter (HOSPITAL_COMMUNITY): Payer: Self-pay

## 2019-07-15 DIAGNOSIS — Z01812 Encounter for preprocedural laboratory examination: Secondary | ICD-10-CM | POA: Insufficient documentation

## 2019-07-15 LAB — CBC
HCT: 37.8 % (ref 36.0–46.0)
Hemoglobin: 11.2 g/dL — ABNORMAL LOW (ref 12.0–15.0)
MCH: 23.8 pg — ABNORMAL LOW (ref 26.0–34.0)
MCHC: 29.6 g/dL — ABNORMAL LOW (ref 30.0–36.0)
MCV: 80.3 fL (ref 80.0–100.0)
Platelets: 212 10*3/uL (ref 150–400)
RBC: 4.71 MIL/uL (ref 3.87–5.11)
RDW: 26.6 % — ABNORMAL HIGH (ref 11.5–15.5)
WBC: 6.3 10*3/uL (ref 4.0–10.5)
nRBC: 0 % (ref 0.0–0.2)

## 2019-07-15 LAB — APTT: aPTT: 32 seconds (ref 24–36)

## 2019-07-15 LAB — COMPREHENSIVE METABOLIC PANEL
ALT: 17 U/L (ref 0–44)
AST: 22 U/L (ref 15–41)
Albumin: 3.6 g/dL (ref 3.5–5.0)
Alkaline Phosphatase: 64 U/L (ref 38–126)
Anion gap: 8 (ref 5–15)
BUN: 15 mg/dL (ref 8–23)
CO2: 22 mmol/L (ref 22–32)
Calcium: 8.8 mg/dL — ABNORMAL LOW (ref 8.9–10.3)
Chloride: 105 mmol/L (ref 98–111)
Creatinine, Ser: 1.37 mg/dL — ABNORMAL HIGH (ref 0.44–1.00)
GFR calc Af Amer: 45 mL/min — ABNORMAL LOW (ref >60)
GFR calc non Af Amer: 38 mL/min — ABNORMAL LOW (ref >60)
Glucose, Bld: 95 mg/dL (ref 70–99)
Potassium: 5.1 mmol/L (ref 3.5–5.1)
Sodium: 135 mmol/L (ref 135–145)
Total Bilirubin: 0.9 mg/dL (ref 0.3–1.2)
Total Protein: 8.1 g/dL (ref 6.5–8.1)

## 2019-07-15 LAB — URINALYSIS, ROUTINE W REFLEX MICROSCOPIC
Bilirubin Urine: NEGATIVE
Glucose, UA: NEGATIVE mg/dL
Hgb urine dipstick: NEGATIVE
Ketones, ur: NEGATIVE mg/dL
Nitrite: NEGATIVE
Protein, ur: 30 mg/dL — AB
Specific Gravity, Urine: 1.006 (ref 1.005–1.030)
pH: 7 (ref 5.0–8.0)

## 2019-07-15 LAB — SURGICAL PCR SCREEN
MRSA, PCR: NEGATIVE
Staphylococcus aureus: NEGATIVE

## 2019-07-15 LAB — PROTIME-INR
INR: 1 (ref 0.8–1.2)
Prothrombin Time: 12.9 seconds (ref 11.4–15.2)

## 2019-07-15 NOTE — Progress Notes (Signed)
Tynetta Piazza     Your procedure is scheduled on Tuesday, April 27th.  Report to [facility] Main Entrance "A" at 5:30 A.M., and check in at the Admitting office.  Call this number if you have problems the morning of surgery:  [phone]  Call [phone] if you have any questions prior to your surgery date Monday-Friday 8am-4pm   Remember:  Do not eat or drink after midnight the night before your surgery  Take these medicines the morning of surgery with A SIP OF WATER aspirin EC atorvastatin (LIPITOR) clopidogrel (PLAVIX) ezetimibe (ZETIA) gabapentin (NEURONTIN) fluticasone furoate-vilanterol (BREO ELLIPTA)/ inhaler isosorbide mononitrate (IMDUR) pantoprazole (PROTONIX)   If needed - albuterol (VENTOLIN HFA)/inhaler, nitroGLYCERIN (NITROSTAT)     Per surgeon's instructions, continue to take Aspirin and Plavix.   As of today, STOP taking any Aleve, Naproxen, Ibuprofen, Motrin, Advil, Goody's, BC's, all herbal medications, fish oil, and all vitamins.                     Do not wear jewelry, make up, or nail polish            Do not wear lotions, powders, perfumes, or deodorant.            Do not shave 48 hours prior to surgery.              Do not bring valuables to the hospital.            [facility] is not responsible for any belongings or valuables.  Do NOT Smoke (Tobacco/Vapping) or drink Alcohol 24 hours prior to your procedure If you use a CPAP at night, you may bring all equipment for your overnight stay.   Contacts, glasses, dentures or bridgework may not be worn into surgery.      For patients admitted to the hospital, discharge time will be determined by your treatment team.   Patients discharged the day of surgery will not be allowed to drive home, and someone needs to stay with them for 24 hours.  Special instructions:   [place]- Preparing For Surgery  Before surgery, you can play an important role. Because skin is not sterile, your skin needs to be as  free of germs as possible. You can reduce the number of germs on your skin by washing with CHG (chlorahexidine gluconate) Soap before surgery.  CHG is an antiseptic cleaner which kills germs and bonds with the skin to continue killing germs even after washing.    Oral Hygiene is also important to reduce your risk of infection.  Remember - BRUSH YOUR TEETH THE MORNING OF SURGERY WITH YOUR REGULAR TOOTHPASTE  Please do not use if you have an allergy to CHG or antibacterial soaps. If your skin becomes reddened/irritated stop using the CHG.  Do not shave (including legs and underarms) for at least 48 hours prior to first CHG shower. It is OK to shave your face.  Please follow these instructions carefully.   1. Shower the NIGHT BEFORE SURGERY and the MORNING OF SURGERY with CHG Soap.   2. If you chose to wash your hair, wash your hair first as usual with your normal shampoo.  3. After you shampoo, rinse your hair and body thoroughly to remove the shampoo.  4. Use CHG as you would any other liquid soap. You can apply CHG directly to the skin and wash gently with a scrungie or a clean washcloth.   5. Apply the CHG Soap to your  body ONLY FROM THE NECK DOWN.  Do not use on open wounds or open sores. Avoid contact with your eyes, ears, mouth and genitals (private parts). Wash Face and genitals (private parts)  with your normal soap.   6. Wash thoroughly, paying special attention to the area where your surgery will be performed.  7. Thoroughly rinse your body with warm water from the neck down.  8. DO NOT shower/wash with your normal soap after using and rinsing off the CHG Soap.  9. Pat yourself dry with a CLEAN TOWEL.  10. Wear CLEAN PAJAMAS to bed the night before surgery, wear comfortable clothes the morning of surgery  11. Place CLEAN SHEETS on your bed the night of your first shower and DO NOT SLEEP WITH PETS. Day of Surgery: Do not apply any deodorants/lotions.  Please wear clean clothes  to the hospital/surgery center.   Remember to brush your teeth WITH YOUR REGULAR TOOTHPASTE.   Please read over the following fact sheets that you were given.

## 2019-07-15 NOTE — Progress Notes (Signed)
PCP Mannie Stabile, MD Cardiologist - Ellyn Hack, MD and Gwenlyn Found, MD Nephrologist - Justin Mend, MD Gastroenterologist - Watt Climes, MD   Chest x-ray - n/a EKG - 05/11/19 Stress Test - 09/07/15  ECHO - 05/14/19 Cardiac Cath - 02/11/19  Follow your surgeon's instructions on when to stop Aspirin.  If no instructions were given by your surgeon then you will need to call the office to get those instructions.    Per surgeon's orders, continue Plavix and ASA. ([place] and said per surgeon's orders to continue both medications)   COVID TEST- 07/27/19  Coronavirus Screening  Have you experienced the following symptoms:  Cough yes/no: No Fever (>100.57F)  yes/no: No Runny nose yes/no: No Sore throat yes/no: No Difficulty breathing/shortness of breath  yes/no: No  Have you or a family member traveled in the last 14 days and where? yes/no: No   If the patient indicates "YES" to the above questions, their PAT will be rescheduled to limit the exposure to others and, the surgeon will be notified. THE PATIENT WILL NEED TO BE ASYMPTOMATIC FOR 14 DAYS.   If the patient is not experiencing any of these symptoms, the PAT nurse will instruct them to NOT bring anyone with them to their appointment since they may have these symptoms or traveled as well.   Please remind your patients and families that hospital visitation restrictions are in effect and the importance of the restrictions.     Anesthesia review: n/a  Patient denies shortness of breath, fever, cough and chest pain at PAT appointment   All instructions explained to the patient, with a verbal understanding of the material. Patient agrees to go over the instructions while at home for a better understanding. Patient also instructed to self quarantine after being tested for COVID-19. The opportunity to ask questions was provided.

## 2019-07-20 ENCOUNTER — Other Ambulatory Visit (HOSPITAL_COMMUNITY): Payer: Medicare Other

## 2019-07-27 ENCOUNTER — Other Ambulatory Visit (HOSPITAL_COMMUNITY)
Admission: RE | Admit: 2019-07-27 | Discharge: 2019-07-27 | Disposition: A | Discharge status: Home / Self Care | Payer: Medicare Other | Source: Ambulatory Visit | Attending: Vascular Surgery | Admitting: Vascular Surgery

## 2019-07-27 ENCOUNTER — Other Ambulatory Visit (HOSPITAL_COMMUNITY): Payer: Medicare Other

## 2019-07-27 LAB — SARS CORONAVIRUS 2 (TAT 6-24 HRS): SARS Coronavirus 2: NEGATIVE

## 2019-07-28 ENCOUNTER — Encounter (HOSPITAL_COMMUNITY)
Admission: RE | Disposition: A | Discharge status: Home / Self Care | Payer: Self-pay | Source: Home / Self Care | Attending: Vascular Surgery

## 2019-07-28 ENCOUNTER — Inpatient Hospital Stay (HOSPITAL_COMMUNITY): Payer: Medicare Other | Admitting: Certified Registered Nurse Anesthetist

## 2019-07-28 ENCOUNTER — Inpatient Hospital Stay (HOSPITAL_COMMUNITY)
Admission: RE | Admit: 2019-07-28 | Discharge: 2019-07-29 | DRG: 038 | Disposition: A | Discharge status: Home / Self Care | Payer: Medicare Other | Attending: Vascular Surgery | Admitting: Vascular Surgery

## 2019-07-28 ENCOUNTER — Other Ambulatory Visit: Payer: Self-pay

## 2019-07-28 DIAGNOSIS — D62 Acute posthemorrhagic anemia: Secondary | ICD-10-CM | POA: Diagnosis not present

## 2019-07-28 DIAGNOSIS — Z20822 Contact with and (suspected) exposure to covid-19: Secondary | ICD-10-CM | POA: Diagnosis present

## 2019-07-28 DIAGNOSIS — I739 Peripheral vascular disease, unspecified: Secondary | ICD-10-CM | POA: Diagnosis present

## 2019-07-28 DIAGNOSIS — Z7951 Long term (current) use of inhaled steroids: Secondary | ICD-10-CM

## 2019-07-28 DIAGNOSIS — G8929 Other chronic pain: Secondary | ICD-10-CM | POA: Diagnosis present

## 2019-07-28 DIAGNOSIS — Z955 Presence of coronary angioplasty implant and graft: Secondary | ICD-10-CM | POA: Diagnosis not present

## 2019-07-28 DIAGNOSIS — K219 Gastro-esophageal reflux disease without esophagitis: Secondary | ICD-10-CM | POA: Diagnosis present

## 2019-07-28 DIAGNOSIS — E785 Hyperlipidemia, unspecified: Secondary | ICD-10-CM | POA: Diagnosis present

## 2019-07-28 DIAGNOSIS — J45909 Unspecified asthma, uncomplicated: Secondary | ICD-10-CM | POA: Diagnosis present

## 2019-07-28 DIAGNOSIS — Z8673 Personal history of transient ischemic attack (TIA), and cerebral infarction without residual deficits: Secondary | ICD-10-CM

## 2019-07-28 DIAGNOSIS — N182 Chronic kidney disease, stage 2 (mild): Secondary | ICD-10-CM | POA: Diagnosis present

## 2019-07-28 DIAGNOSIS — I6523 Occlusion and stenosis of bilateral carotid arteries: Secondary | ICD-10-CM | POA: Diagnosis present

## 2019-07-28 DIAGNOSIS — Z7982 Long term (current) use of aspirin: Secondary | ICD-10-CM | POA: Diagnosis not present

## 2019-07-28 DIAGNOSIS — M549 Dorsalgia, unspecified: Secondary | ICD-10-CM | POA: Diagnosis present

## 2019-07-28 DIAGNOSIS — Z79899 Other long term (current) drug therapy: Secondary | ICD-10-CM

## 2019-07-28 DIAGNOSIS — I252 Old myocardial infarction: Secondary | ICD-10-CM

## 2019-07-28 DIAGNOSIS — Z8249 Family history of ischemic heart disease and other diseases of the circulatory system: Secondary | ICD-10-CM

## 2019-07-28 DIAGNOSIS — D509 Iron deficiency anemia, unspecified: Secondary | ICD-10-CM | POA: Diagnosis present

## 2019-07-28 DIAGNOSIS — I5032 Chronic diastolic (congestive) heart failure: Secondary | ICD-10-CM | POA: Diagnosis present

## 2019-07-28 DIAGNOSIS — I2510 Atherosclerotic heart disease of native coronary artery without angina pectoris: Secondary | ICD-10-CM | POA: Diagnosis present

## 2019-07-28 DIAGNOSIS — F17200 Nicotine dependence, unspecified, uncomplicated: Secondary | ICD-10-CM | POA: Diagnosis present

## 2019-07-28 DIAGNOSIS — Z833 Family history of diabetes mellitus: Secondary | ICD-10-CM | POA: Diagnosis not present

## 2019-07-28 DIAGNOSIS — I130 Hypertensive heart and chronic kidney disease with heart failure and stage 1 through stage 4 chronic kidney disease, or unspecified chronic kidney disease: Secondary | ICD-10-CM | POA: Diagnosis present

## 2019-07-28 DIAGNOSIS — I6522 Occlusion and stenosis of left carotid artery: Secondary | ICD-10-CM | POA: Diagnosis not present

## 2019-07-28 DIAGNOSIS — I6529 Occlusion and stenosis of unspecified carotid artery: Secondary | ICD-10-CM | POA: Diagnosis present

## 2019-07-28 DIAGNOSIS — Z7902 Long term (current) use of antithrombotics/antiplatelets: Secondary | ICD-10-CM

## 2019-07-28 DIAGNOSIS — Z9049 Acquired absence of other specified parts of digestive tract: Secondary | ICD-10-CM

## 2019-07-28 HISTORY — PX: ENDARTERECTOMY: SHX5162

## 2019-07-28 HISTORY — PX: PATCH ANGIOPLASTY: SHX6230

## 2019-07-28 LAB — CREATININE, SERUM
Creatinine, Ser: 1.37 mg/dL — ABNORMAL HIGH (ref 0.44–1.00)
GFR calc Af Amer: 45 mL/min — ABNORMAL LOW (ref >60)
GFR calc non Af Amer: 38 mL/min — ABNORMAL LOW (ref >60)

## 2019-07-28 LAB — CBC
HCT: 23.3 % — ABNORMAL LOW (ref 36.0–46.0)
Hemoglobin: 6.9 g/dL — CL (ref 12.0–15.0)
MCH: 24.2 pg — ABNORMAL LOW (ref 26.0–34.0)
MCHC: 29.6 g/dL — ABNORMAL LOW (ref 30.0–36.0)
MCV: 81.8 fL (ref 80.0–100.0)
Platelets: 137 10*3/uL — ABNORMAL LOW (ref 150–400)
RBC: 2.85 MIL/uL — ABNORMAL LOW (ref 3.87–5.11)
RDW: 25.3 % — ABNORMAL HIGH (ref 11.5–15.5)
WBC: 8.5 10*3/uL (ref 4.0–10.5)
nRBC: 0 % (ref 0.0–0.2)

## 2019-07-28 LAB — HEMOGLOBIN AND HEMATOCRIT, BLOOD
HCT: 26.6 % — ABNORMAL LOW (ref 36.0–46.0)
Hemoglobin: 8.4 g/dL — ABNORMAL LOW (ref 12.0–15.0)

## 2019-07-28 LAB — TYPE AND SCREEN
ABO/RH(D): A POS
Antibody Screen: NEGATIVE

## 2019-07-28 LAB — PREPARE RBC (CROSSMATCH)

## 2019-07-28 SURGERY — ENDARTERECTOMY, CAROTID
Anesthesia: General | Site: Neck | Laterality: Left

## 2019-07-28 MED ORDER — SODIUM CHLORIDE 0.9 % IV SOLN
INTRAVENOUS | Status: AC
  Filled 2019-07-28: qty 1.2

## 2019-07-28 MED ORDER — METOPROLOL TARTRATE 5 MG/5ML IV SOLN: 2.0000 mg | INTRAVENOUS | Status: DC | PRN

## 2019-07-28 MED ORDER — OXYCODONE HCL 5 MG PO TABS
5.0000 mg | ORAL_TABLET | ORAL | Status: DC | PRN
  Administered 2019-07-28 (×3): 5 mg via ORAL
  Filled 2019-07-28 (×4): qty 1

## 2019-07-28 MED ORDER — SODIUM CHLORIDE 0.9 % IV SOLN: INTRAVENOUS | Status: DC

## 2019-07-28 MED ORDER — ACETAMINOPHEN 325 MG RE SUPP: 325.0000 mg | RECTAL | Status: DC | PRN

## 2019-07-28 MED ORDER — OXYCODONE HCL 5 MG PO TABS: 5.0000 mg | ORAL_TABLET | Freq: Once | ORAL | Status: DC | PRN

## 2019-07-28 MED ORDER — DEXTRAN 40 IN SALINE 10-0.9 % IV SOLN
INTRAVENOUS | Status: AC | PRN
  Administered 2019-07-28: 500 mL

## 2019-07-28 MED ORDER — PROTAMINE SULFATE 10 MG/ML IV SOLN
INTRAVENOUS | Status: DC | PRN
  Administered 2019-07-28: 50 mg via INTRAVENOUS

## 2019-07-28 MED ORDER — EPHEDRINE SULFATE-NACL 50-0.9 MG/10ML-% IV SOSY
PREFILLED_SYRINGE | INTRAVENOUS | Status: DC | PRN
  Administered 2019-07-28: 5 mg via INTRAVENOUS

## 2019-07-28 MED ORDER — LIDOCAINE 2% (20 MG/ML) 5 ML SYRINGE
INTRAMUSCULAR | Status: AC
  Filled 2019-07-28: qty 5

## 2019-07-28 MED ORDER — HEMOSTATIC AGENTS (NO CHARGE) OPTIME
TOPICAL | Status: DC | PRN
  Administered 2019-07-28 (×2): 1 via TOPICAL

## 2019-07-28 MED ORDER — SODIUM CHLORIDE 0.9 % IV SOLN
0.0125 ug/kg/min | INTRAVENOUS | Status: AC
  Administered 2019-07-28: .1 ug/kg/min via INTRAVENOUS
  Filled 2019-07-28: qty 1000

## 2019-07-28 MED ORDER — FLUTICASONE FUROATE-VILANTEROL 100-25 MCG/INH IN AEPB
1.0000 | INHALATION_SPRAY | Freq: Every day | RESPIRATORY_TRACT | Status: DC
  Filled 2019-07-28: qty 28

## 2019-07-28 MED ORDER — NITROGLYCERIN 0.4 MG SL SUBL: 0.4000 mg | SUBLINGUAL_TABLET | SUBLINGUAL | Status: DC | PRN

## 2019-07-28 MED ORDER — POTASSIUM CHLORIDE CRYS ER 20 MEQ PO TBCR: 20.0000 meq | EXTENDED_RELEASE_TABLET | Freq: Every day | ORAL | Status: DC | PRN

## 2019-07-28 MED ORDER — DEXAMETHASONE SODIUM PHOSPHATE 10 MG/ML IJ SOLN
INTRAMUSCULAR | Status: DC | PRN
  Administered 2019-07-28: 10 mg via INTRAVENOUS

## 2019-07-28 MED ORDER — SODIUM CHLORIDE 0.9 % IV SOLN
INTRAVENOUS | Status: DC | PRN
  Administered 2019-07-28: 500 mL

## 2019-07-28 MED ORDER — PHENYLEPHRINE HCL-NACL 10-0.9 MG/250ML-% IV SOLN
INTRAVENOUS | Status: DC | PRN
  Administered 2019-07-28: 30 ug/min via INTRAVENOUS

## 2019-07-28 MED ORDER — DOCUSATE SODIUM 100 MG PO CAPS
100.0000 mg | ORAL_CAPSULE | Freq: Every day | ORAL | Status: DC
  Administered 2019-07-29: 100 mg via ORAL
  Filled 2019-07-28: qty 1

## 2019-07-28 MED ORDER — DEXAMETHASONE SODIUM PHOSPHATE 10 MG/ML IJ SOLN
INTRAMUSCULAR | Status: AC
  Filled 2019-07-28: qty 2

## 2019-07-28 MED ORDER — PROPOFOL 10 MG/ML IV BOLUS
INTRAVENOUS | Status: DC | PRN
  Administered 2019-07-28: 20 mg via INTRAVENOUS
  Administered 2019-07-28: 40 mg via INTRAVENOUS

## 2019-07-28 MED ORDER — HYDRALAZINE HCL 20 MG/ML IJ SOLN: 5.0000 mg | INTRAMUSCULAR | Status: DC | PRN

## 2019-07-28 MED ORDER — SODIUM CHLORIDE 0.9% IV SOLUTION: Freq: Once | INTRAVENOUS | Status: AC

## 2019-07-28 MED ORDER — CHLORHEXIDINE GLUCONATE CLOTH 2 % EX PADS: 6.0000 | MEDICATED_PAD | Freq: Once | CUTANEOUS | Status: DC

## 2019-07-28 MED ORDER — EZETIMIBE 10 MG PO TABS
10.0000 mg | ORAL_TABLET | Freq: Every day | ORAL | Status: DC
  Administered 2019-07-29: 10 mg via ORAL
  Filled 2019-07-28: qty 1

## 2019-07-28 MED ORDER — ALBUTEROL SULFATE HFA 108 (90 BASE) MCG/ACT IN AERS: 2.0000 | INHALATION_SPRAY | Freq: Four times a day (QID) | RESPIRATORY_TRACT | Status: DC | PRN

## 2019-07-28 MED ORDER — CEFAZOLIN SODIUM-DEXTROSE 2-4 GM/100ML-% IV SOLN
INTRAVENOUS | Status: AC
  Filled 2019-07-28: qty 100

## 2019-07-28 MED ORDER — FENTANYL CITRATE (PF) 250 MCG/5ML IJ SOLN
INTRAMUSCULAR | Status: AC
  Filled 2019-07-28: qty 5

## 2019-07-28 MED ORDER — SODIUM CHLORIDE 0.9 % IV SOLN: 500.0000 mL | Freq: Once | INTRAVENOUS | Status: DC | PRN

## 2019-07-28 MED ORDER — FENTANYL CITRATE (PF) 100 MCG/2ML IJ SOLN
INTRAMUSCULAR | Status: AC
  Filled 2019-07-28: qty 2

## 2019-07-28 MED ORDER — CEFAZOLIN SODIUM-DEXTROSE 2-4 GM/100ML-% IV SOLN
2.0000 g | Freq: Three times a day (TID) | INTRAVENOUS | Status: AC
  Administered 2019-07-28 (×2): 2 g via INTRAVENOUS
  Filled 2019-07-28 (×2): qty 100

## 2019-07-28 MED ORDER — ACETAMINOPHEN 500 MG PO TABS: 1000.0000 mg | ORAL_TABLET | Freq: Once | ORAL | Status: DC | PRN

## 2019-07-28 MED ORDER — LIDOCAINE HCL (PF) 1 % IJ SOLN
INTRAMUSCULAR | Status: AC
  Filled 2019-07-28: qty 5

## 2019-07-28 MED ORDER — ALUM & MAG HYDROXIDE-SIMETH 200-200-20 MG/5ML PO SUSP: 15.0000 mL | ORAL | Status: DC | PRN

## 2019-07-28 MED ORDER — 0.9 % SODIUM CHLORIDE (POUR BTL) OPTIME
TOPICAL | Status: DC | PRN
  Administered 2019-07-28: 2000 mL
  Administered 2019-07-28: 09:00:00 500 mL

## 2019-07-28 MED ORDER — PHENOL 1.4 % MT LIQD: 1.0000 | OROMUCOSAL | Status: DC | PRN

## 2019-07-28 MED ORDER — GUAIFENESIN-DM 100-10 MG/5ML PO SYRP: 15.0000 mL | ORAL_SOLUTION | ORAL | Status: DC | PRN

## 2019-07-28 MED ORDER — ROCURONIUM BROMIDE 50 MG/5ML IV SOSY
PREFILLED_SYRINGE | INTRAVENOUS | Status: DC | PRN
  Administered 2019-07-28: 10 mg via INTRAVENOUS
  Administered 2019-07-28: 60 mg via INTRAVENOUS

## 2019-07-28 MED ORDER — ALBUMIN HUMAN 5 % IV SOLN: INTRAVENOUS | Status: DC | PRN

## 2019-07-28 MED ORDER — MAGNESIUM SULFATE 2 GM/50ML IV SOLN: 2.0000 g | Freq: Every day | INTRAVENOUS | Status: DC | PRN

## 2019-07-28 MED ORDER — ISOSORBIDE MONONITRATE ER 60 MG PO TB24
90.0000 mg | ORAL_TABLET | Freq: Every day | ORAL | Status: DC
  Administered 2019-07-29: 90 mg via ORAL
  Filled 2019-07-28: qty 1

## 2019-07-28 MED ORDER — ONDANSETRON HCL 4 MG/2ML IJ SOLN
INTRAMUSCULAR | Status: DC | PRN
  Administered 2019-07-28: 4 mg via INTRAVENOUS

## 2019-07-28 MED ORDER — FENTANYL CITRATE (PF) 100 MCG/2ML IJ SOLN
25.0000 ug | INTRAMUSCULAR | Status: DC | PRN
  Administered 2019-07-28: 25 ug via INTRAVENOUS

## 2019-07-28 MED ORDER — MIDAZOLAM HCL 2 MG/2ML IJ SOLN
INTRAMUSCULAR | Status: AC
  Filled 2019-07-28: qty 2

## 2019-07-28 MED ORDER — ROCURONIUM BROMIDE 10 MG/ML (PF) SYRINGE
PREFILLED_SYRINGE | INTRAVENOUS | Status: AC
  Filled 2019-07-28: qty 10

## 2019-07-28 MED ORDER — ONDANSETRON HCL 4 MG/2ML IJ SOLN: 4.0000 mg | Freq: Four times a day (QID) | INTRAMUSCULAR | Status: DC | PRN

## 2019-07-28 MED ORDER — BISACODYL 5 MG PO TBEC: 5.0000 mg | DELAYED_RELEASE_TABLET | Freq: Every day | ORAL | Status: DC | PRN

## 2019-07-28 MED ORDER — LACTATED RINGERS IV SOLN: INTRAVENOUS | Status: DC | PRN

## 2019-07-28 MED ORDER — FERROUS GLUCONATE 324 (38 FE) MG PO TABS
324.0000 mg | ORAL_TABLET | Freq: Two times a day (BID) | ORAL | Status: DC
  Administered 2019-07-28 – 2019-07-29 (×2): 324 mg via ORAL
  Filled 2019-07-28 (×2): qty 1

## 2019-07-28 MED ORDER — EPHEDRINE SULFATE 50 MG/ML IJ SOLN
INTRAMUSCULAR | Status: DC | PRN
  Administered 2019-07-28 (×3): 5 mg via INTRAVENOUS

## 2019-07-28 MED ORDER — ACETAMINOPHEN 160 MG/5ML PO SOLN: 1000.0000 mg | Freq: Once | ORAL | Status: DC | PRN

## 2019-07-28 MED ORDER — DEXTRAN 40 IN SALINE 10-0.9 % IV SOLN
INTRAVENOUS | Status: AC
  Filled 2019-07-28: qty 500

## 2019-07-28 MED ORDER — SENNOSIDES-DOCUSATE SODIUM 8.6-50 MG PO TABS: 1.0000 | ORAL_TABLET | Freq: Every evening | ORAL | Status: DC | PRN

## 2019-07-28 MED ORDER — HEPARIN SODIUM (PORCINE) 1000 UNIT/ML IJ SOLN
INTRAMUSCULAR | Status: DC | PRN
  Administered 2019-07-28: 6000 [IU] via INTRAVENOUS
  Administered 2019-07-28: 3000 [IU] via INTRAVENOUS

## 2019-07-28 MED ORDER — PANTOPRAZOLE SODIUM 40 MG PO TBEC
40.0000 mg | DELAYED_RELEASE_TABLET | Freq: Every day | ORAL | Status: DC
  Administered 2019-07-29: 40 mg via ORAL
  Filled 2019-07-28: qty 1

## 2019-07-28 MED ORDER — CLOPIDOGREL BISULFATE 75 MG PO TABS
75.0000 mg | ORAL_TABLET | Freq: Every day | ORAL | Status: DC
  Administered 2019-07-29: 75 mg via ORAL
  Filled 2019-07-28: qty 1

## 2019-07-28 MED ORDER — ACETAMINOPHEN 10 MG/ML IV SOLN: 1000.0000 mg | Freq: Once | INTRAVENOUS | Status: DC | PRN

## 2019-07-28 MED ORDER — ATORVASTATIN CALCIUM 80 MG PO TABS
80.0000 mg | ORAL_TABLET | Freq: Every day | ORAL | Status: DC
  Administered 2019-07-29: 80 mg via ORAL
  Filled 2019-07-28: qty 1

## 2019-07-28 MED ORDER — METOPROLOL SUCCINATE ER 100 MG PO TB24
200.0000 mg | ORAL_TABLET | Freq: Every day | ORAL | Status: DC
  Filled 2019-07-28: qty 2

## 2019-07-28 MED ORDER — FENTANYL CITRATE (PF) 250 MCG/5ML IJ SOLN
INTRAMUSCULAR | Status: DC | PRN
  Administered 2019-07-28: 50 ug via INTRAVENOUS
  Administered 2019-07-28: 100 ug via INTRAVENOUS
  Administered 2019-07-28: 50 ug via INTRAVENOUS

## 2019-07-28 MED ORDER — ALBUTEROL SULFATE (2.5 MG/3ML) 0.083% IN NEBU: 2.5000 mg | INHALATION_SOLUTION | Freq: Four times a day (QID) | RESPIRATORY_TRACT | Status: DC | PRN

## 2019-07-28 MED ORDER — LABETALOL HCL 5 MG/ML IV SOLN: 10.0000 mg | INTRAVENOUS | Status: DC | PRN

## 2019-07-28 MED ORDER — PROPOFOL 10 MG/ML IV BOLUS
INTRAVENOUS | Status: AC
  Filled 2019-07-28: qty 20

## 2019-07-28 MED ORDER — EPHEDRINE 5 MG/ML INJ
INTRAVENOUS | Status: AC
  Filled 2019-07-28: qty 10

## 2019-07-28 MED ORDER — PHENYLEPHRINE HCL (PRESSORS) 10 MG/ML IV SOLN
INTRAVENOUS | Status: DC | PRN
  Administered 2019-07-28: 40 ug via INTRAVENOUS
  Administered 2019-07-28: 80 ug via INTRAVENOUS
  Administered 2019-07-28: 40 ug via INTRAVENOUS

## 2019-07-28 MED ORDER — HEPARIN SODIUM (PORCINE) 5000 UNIT/ML IJ SOLN: 5000.0000 [IU] | Freq: Three times a day (TID) | INTRAMUSCULAR | Status: DC

## 2019-07-28 MED ORDER — ASPIRIN EC 81 MG PO TBEC
81.0000 mg | DELAYED_RELEASE_TABLET | Freq: Every day | ORAL | Status: DC
  Administered 2019-07-29: 81 mg via ORAL
  Filled 2019-07-28: qty 1

## 2019-07-28 MED ORDER — SUCCINYLCHOLINE CHLORIDE 200 MG/10ML IV SOSY
PREFILLED_SYRINGE | INTRAVENOUS | Status: AC
  Filled 2019-07-28: qty 10

## 2019-07-28 MED ORDER — OXYCODONE HCL 5 MG/5ML PO SOLN: 5.0000 mg | Freq: Once | ORAL | Status: DC | PRN

## 2019-07-28 MED ORDER — PHENYLEPHRINE 40 MCG/ML (10ML) SYRINGE FOR IV PUSH (FOR BLOOD PRESSURE SUPPORT)
PREFILLED_SYRINGE | INTRAVENOUS | Status: AC
  Filled 2019-07-28: qty 10

## 2019-07-28 MED ORDER — CEFAZOLIN SODIUM-DEXTROSE 2-4 GM/100ML-% IV SOLN
2.0000 g | INTRAVENOUS | Status: AC
  Administered 2019-07-28: 2 g via INTRAVENOUS

## 2019-07-28 MED ORDER — ACETAMINOPHEN 325 MG PO TABS: 325.0000 mg | ORAL_TABLET | ORAL | Status: DC | PRN

## 2019-07-28 MED ORDER — ONDANSETRON HCL 4 MG/2ML IJ SOLN
INTRAMUSCULAR | Status: AC
  Filled 2019-07-28: qty 2

## 2019-07-28 SURGICAL SUPPLY — 53 items
ADH SKN CLS APL DERMABOND .7 (GAUZE/BANDAGES/DRESSINGS) ×2
ADPR TBG 2 MALE LL ART (MISCELLANEOUS)
BAG DECANTER FOR FLEXI CONT (MISCELLANEOUS) ×4 IMPLANT
CANISTER SUCT 3000ML PPV (MISCELLANEOUS) ×4 IMPLANT
CATH ROBINSON RED A/P 18FR (CATHETERS) ×4 IMPLANT
CLIP VESOCCLUDE MED 24/CT (CLIP) ×4 IMPLANT
CLIP VESOCCLUDE SM WIDE 24/CT (CLIP) ×4 IMPLANT
COVER PROBE W GEL 5X96 (DRAPES) ×2 IMPLANT
DERMABOND ADVANCED (GAUZE/BANDAGES/DRESSINGS) ×2
DERMABOND ADVANCED .7 DNX12 (GAUZE/BANDAGES/DRESSINGS) ×2 IMPLANT
DRAIN CHANNEL 15F RND FF W/TCR (WOUND CARE) IMPLANT
ELECT REM PT RETURN 9FT ADLT (ELECTROSURGICAL) ×4
ELECTRODE REM PT RTRN 9FT ADLT (ELECTROSURGICAL) ×2 IMPLANT
EVACUATOR SILICONE 100CC (DRAIN) IMPLANT
GLOVE BIO SURGEON STRL SZ7.5 (GLOVE) ×4 IMPLANT
GLOVE BIOGEL PI IND STRL 6.5 (GLOVE) IMPLANT
GLOVE BIOGEL PI IND STRL 7.5 (GLOVE) IMPLANT
GLOVE BIOGEL PI INDICATOR 6.5 (GLOVE) ×2
GLOVE BIOGEL PI INDICATOR 7.5 (GLOVE) ×2
GLOVE ECLIPSE 7.0 STRL STRAW (GLOVE) ×2 IMPLANT
GOWN STRL REUS W/ TWL LRG LVL3 (GOWN DISPOSABLE) ×4 IMPLANT
GOWN STRL REUS W/ TWL XL LVL3 (GOWN DISPOSABLE) ×2 IMPLANT
GOWN STRL REUS W/TWL LRG LVL3 (GOWN DISPOSABLE) ×12
GOWN STRL REUS W/TWL XL LVL3 (GOWN DISPOSABLE) ×12
HEMOSTAT SNOW SURGICEL 2X4 (HEMOSTASIS) ×4 IMPLANT
INSERT FOGARTY SM (MISCELLANEOUS) IMPLANT
IV ADAPTER SYR DOUBLE MALE LL (MISCELLANEOUS) IMPLANT
KIT BASIN OR (CUSTOM PROCEDURE TRAY) ×4 IMPLANT
KIT SHUNT ARGYLE CAROTID ART 6 (VASCULAR PRODUCTS) ×4 IMPLANT
KIT TURNOVER KIT B (KITS) ×4 IMPLANT
NDL HYPO 25GX1X1/2 BEV (NEEDLE) IMPLANT
NDL SPNL 20GX3.5 QUINCKE YW (NEEDLE) IMPLANT
NEEDLE HYPO 25GX1X1/2 BEV (NEEDLE) IMPLANT
NEEDLE SPNL 20GX3.5 QUINCKE YW (NEEDLE) IMPLANT
NS IRRIG 1000ML POUR BTL (IV SOLUTION) ×12 IMPLANT
PACK CAROTID (CUSTOM PROCEDURE TRAY) ×4 IMPLANT
PAD ARMBOARD 7.5X6 YLW CONV (MISCELLANEOUS) ×8 IMPLANT
PATCH VASC XENOSURE 1CMX6CM (Vascular Products) ×4 IMPLANT
PATCH VASC XENOSURE 1X6 (Vascular Products) IMPLANT
POSITIONER HEAD DONUT 9IN (MISCELLANEOUS) ×4 IMPLANT
STOPCOCK 4 WAY LG BORE MALE ST (IV SETS) IMPLANT
SUT ETHILON 3 0 PS 1 (SUTURE) IMPLANT
SUT MNCRL AB 4-0 PS2 18 (SUTURE) ×4 IMPLANT
SUT PROLENE 5 0 C 1 24 (SUTURE) ×2 IMPLANT
SUT PROLENE 6 0 BV (SUTURE) ×6 IMPLANT
SUT SILK 3 0 (SUTURE)
SUT SILK 3-0 18XBRD TIE 12 (SUTURE) IMPLANT
SUT VIC AB 3-0 SH 27 (SUTURE) ×4
SUT VIC AB 3-0 SH 27X BRD (SUTURE) ×2 IMPLANT
SYR CONTROL 10ML LL (SYRINGE) IMPLANT
TOWEL GREEN STERILE (TOWEL DISPOSABLE) ×4 IMPLANT
TUBING ART PRESS 48 MALE/FEM (TUBING) IMPLANT
WATER STERILE IRR 1000ML POUR (IV SOLUTION) ×4 IMPLANT

## 2019-07-28 NOTE — Progress Notes (Signed)
   Post op left CEA EBL 200 cc Hypotensive 90's /40's not requiring pressors Alert and oriented x 3 HGB draws with drops in HGB to 6.8.  1 unit transfused ordered  Smile symmetric and no tongue deviation moving all 4 extremities.  Stable post op disposition.  Roxy Horseman PA-C

## 2019-07-28 NOTE — Progress Notes (Signed)
Called Dr. Glennon Mac to review chart.  Patient with extensive cardiac history.

## 2019-07-28 NOTE — Progress Notes (Signed)
Patient arrived to 4E room 13 at this time. Telemetry applied and CCMD notified. CHG bath done. V/s and assessment complete. Aline clean dry and intact, zero'd. Left neck incision clean dry and intact. Patient oriented to room and how to call nurse with any needs.

## 2019-07-28 NOTE — Op Note (Signed)
    Patient name: Winta Barcelo MRN: [id number] DOB: [birth year] Sex: female  07/28/2019 Pre-operative Diagnosis: Symptomatic left internal carotid artery stenosis Post-operative diagnosis:  Same Surgeon:  Erlene Quan C. Donzetta Matters, MD Assistant: Laurence Slate, PA Procedure Performed:  Left carotid endarterectomy with bovine pericardial patch angioplasty and 10 French shunt neuro protection  Indications: 74 year old female with history of left sided TIA resulting in slurred speech and right arm numbness.  She is now indicated for left carotid endarterectomy.  Also has 80 to 90% stenosis of the right ICA and will need interval right carotid endarterectomy.  Findings: There is highly calcified lesion just at the bifurcation.  Shunt was placed.  At completion there were expected Doppler signals with low resistance waveform in the ICA distal.   Procedure:  The patient was identified in the holding area and taken to the operating room where she was put supine operative when general anesthesia was induced.  She was sterilely prepped and draped in the left neck in the usual fashion antibiotics were minister timeout was called.  We began with incision along the anterior border the sternocleidomastoid.  We dissected down divided facial veins between ties.  We identified the common carotid artery and placed a umbilical tape around this patient was fully heparinized.  Initially 6000 units of heparin was administered an additional 3000 was given after this for an adequate ACT at 230.  We then dissected up on the external carotid artery.  It had many early branches and one was divided between ties.  A vessel loop was placed around this.  We identified the hypoglossal nerve.  The ansa cervicalis was divided between clips.  We mobilized this for several centimeters.  We identified the ICA distally placed a vessel loop around this.  We prepared a 10 [place] shunt.  Blood pressure and ACT were confirmed with blood pressure map  around 100 at this time.  We then clamped the internal followed by the common and external carotid arteries.  We opened the vessel longitudinally.  A 10 French shunt was placed distally allowed to backbleed and placed proximally.  We confirmed flow with Doppler.  We proceed with endarterectomy we had smooth endpoint distally.  We thoroughly irrigated with heparinized saline.  We did perform eversion of the external carotid artery with all the branches.  We had good backbleeding from this.  We then prepared a bovine pericardial patch and sewed this in place with 6-0 Prolene suture.  Prior completion without flushing all directions and removed our shunt and antegrade and backbleed all of our vessels.  We then thoroughly irrigated again.  We completed the patch.  We remove the clamp on the external followed by the common after several cardiac cycles the internal carotid artery.  Had good Doppler signal distally.  50 mg protamine was administered.  The pain hemostasis and irrigated closed in layers with Vicryl and Monocryl.  Dermabond was placed at the level of the skin.  She was awakened from anesthesia having tolerated procedure well without evidence of neurologic complication and was then transferred to the recovery room in stable condition.    EBL: 100cc     Joshua Zeringue C. Donzetta Matters, MD [facility] [place] Office: [phone] Pager: [phone]

## 2019-07-28 NOTE — Plan of Care (Signed)
Continue to monitor

## 2019-07-28 NOTE — H&P (Signed)
HPI:  Kayla Mcclure is a 74 y.o. female recent hospitalization for GI bleed for small bowel arteriovenous malformation. During that time she was found to have slurring of her speech with right hand paresthesias that lasted 2 to 3 days. Her MRI was negative. She underwent carotid duplex as well as CT angio of her head and neck. She has followed up with neurology. She has no further symptoms. She continues on aspirin, Plavix, statin. She has never had neck surgery. No history of vascular disease. She does continue to smoke occasionally. Previous coronary disease with stent in 2009.        Past Medical History:  Diagnosis Date  . Asthma 10/31/2007  . CAD (coronary artery disease) 08/05/2007   a) NSTEMI 2009: PCI to the RCA Promus DES 2.5 mm x 23 mm (3.0 mm). b) Myoview June 2017: Nonspecific ST changes. EF greater than 65%. LOW RISK, normal study. No ischemia or infarction.  . Carotid artery disease ([place])    a. 50-69% RICA by outpt dopp 04/22/13, f/u due 04/2014.  Marland Kitchen Chronic back pain   . Chronic diastolic heart failure ([place])   . CKD (chronic kidney disease), stage II   . Diverticulosis 04/11/2010    Colonoscopy  . Dyslipidemia, goal LDL below 70   . Essential hypertension    a. Normal renal arteries by PV angio 05/2013.  Marland Kitchen GERD (gastroesophageal reflux disease)   . GI bleed 12/2015  . History of recent blood transfusion 12/2015   12-19-15, 12-20-15 and 12-21-15  . Insomnia   . Internal hemorrhoids 04/11/2010   By colonoscopy  . Iron deficiency anemia   . Myocardial infarction ([place]) 2009  . Peripheral arterial disease ([place])    a. s/p PTA and stenting of L CIA stenosis 05/2013.  Marland Kitchen Renal insufficiency   . Tobacco abuse    Quit in 05/2013        Family History  Problem Relation Age of Onset  . Diabetes Mother   . Heart disease Mother   . Heart attack Mother 46  . Breast cancer Mother 72  . Colon cancer Mother 70  . Heart attack Father 44  . Pneumonia Father   . Diabetes  Sister   . Breast cancer Sister   . Breast cancer Sister 3        Past Surgical History:  Procedure Laterality Date  . CHOLECYSTECTOMY  1975  . COLONOSCOPY  04/11/2010  . colonscopy  11/30/2015  . CORONARY ANGIOPLASTY WITH STENT PLACEMENT  08/05/2007   Promus DES 2.5 mm x 23 mm (3 mm) to RCA; EF 60-70%  . ENTEROSCOPY N/A 12/27/2015   Procedure: ENTEROSCOPY; Surgeon: Clarene Essex, MD; Location: [facility] ENDOSCOPY; Service: Endoscopy; Laterality: N/A; also needs slim egd scope  . ENTEROSCOPY N/A 05/15/2019   Procedure: ENTEROSCOPY; Surgeon: Ronald Lobo, MD; Location: [place]; Service: Endoscopy; Laterality: N/A; Push enteroscopy using either ultraslim colonoscope or push enteroscope  . ESOPHAGOGASTRODUODENOSCOPY N/A 09/13/2013   Procedure: ESOPHAGOGASTRODUODENOSCOPY (EGD); Surgeon: Lear Ng, MD; Location: [facility] ENDOSCOPY; Service: Endoscopy; Laterality: N/A;  . ESOPHAGOGASTRODUODENOSCOPY N/A 07/14/2015   Procedure: ESOPHAGOGASTRODUODENOSCOPY (EGD); Surgeon: Clarene Essex, MD; Location: [facility] ENDOSCOPY; Service: Endoscopy; Laterality: N/A;  . gi bleed  07/2015   blood given  . HOT HEMOSTASIS N/A 12/27/2015   Procedure: HOT HEMOSTASIS (ARGON PLASMA COAGULATION/BICAP); Surgeon: Clarene Essex, MD; Location: [facility] ENDOS[facility]; Service: Endoscopy; Laterality: N/A;  . ILIAC ARTERY STENT Left 05/04/2013   L CIA-EIA -- Dr. Gwenlyn Found  . LEFT HEART CATH  AND CORONARY ANGIOGRAPHY N/A 02/11/2019   Procedure: LEFT HEART CATH AND CORONARY ANGIOGRAPHY; Surgeon: Leonie Man, MD; Location: [place] CV LAB;;- Patent RCA stent with minimal ISR after pRCA 50%). Small caliber RPAV with ~75%. pLAD 30%, m-dLAD 40%, D2 40%. Normal LVEDP and LVEF.  . LOWER EXTREMITY ANGIOGRAM Bilateral 05/04/2013   Procedure: LOWER EXTREMITY ANGIOGRAM; Surgeon: Lorretta Harp, MD; Location: [facility] CATH LAB; Service: Cardiovascular; Laterality: Bilateral;  . NM MYOVIEW LTD  09/07/2015   EF > 65%. Nonspecific ST changes but no ischemic  changes. No ischemia or infarction. LOW RISK  . TONSILLECTOMY  1960's  . TRANSTHORACIC ECHOCARDIOGRAM  09/21/2015   EF 60-65%. GR 1 DD. No regional wall motion analysis. No valve disease.  Marland Kitchen UPPER GI ENDOSCOPY  12/14/2015   Short Social History:  Social History        Tobacco Use  . Smoking status: Former Smoker    Types: Cigarettes    Quit date: 05/04/2013    Years since quitting: 6.1  . Smokeless tobacco: Never Used  Substance Use Topics  . Alcohol use: Yes    Comment: occasionally 3 times a year   No Known Allergies        Current Outpatient Medications  Medication Sig Dispense Refill  . aspirin EC 81 MG tablet Take 81 mg by mouth daily.    Marland Kitchen atorvastatin (LIPITOR) 80 MG tablet Take 1 tablet (80 mg total) by mouth daily. 90 tablet 3  . clopidogrel (PLAVIX) 75 MG tablet Take 1 tablet (75 mg total) by mouth daily. 90 tablet 3  . ezetimibe (ZETIA) 10 MG tablet Take 1 tablet (10 mg total) by mouth daily. 90 tablet 3  . ferrous gluconate (FERGON) 324 MG tablet Take 1 tablet (324 mg total) by mouth 2 (two) times daily with a meal. 60 tablet 3  . fluticasone furoate-vilanterol (BREO ELLIPTA) 100-25 MCG/INH AEPB Inhale 1 puff into the lungs daily.    Marland Kitchen gabapentin (NEURONTIN) 300 MG capsule Take 1 capsule (300 mg total) by mouth 2 (two) times daily. (Patient taking differently: Take 300 mg by mouth 2 (two) times daily as needed (pain.). ) 60 capsule 11  . isosorbide mononitrate (IMDUR) 60 MG 24 hr tablet Take 1.5 tablets (90 mg total) by mouth daily. 135 tablet 3  . loratadine (CLARITIN) 10 MG tablet Take 10 mg by mouth daily as needed for allergies.    . metoprolol succinate (TOPROL-XL) 100 MG 24 hr tablet Take 2 tablets (200 mg total) by mouth at bedtime. 180 tablet 3  . nitroGLYCERIN (NITROSTAT) 0.4 MG SL tablet Place 1 tablet (0.4 mg total) under the tongue every 5 (five) minutes as needed for chest pain. 25 tablet 6  . pantoprazole (PROTONIX) 40 MG tablet Take 1 tablet (40 mg total)  by mouth daily. (Patient taking differently: Take 40 mg by mouth daily as needed (acid reflux/indigestion.). ) 30 tablet 0   No current facility-administered medications for this visit.   Review of Systems  Constitutional: Constitutional negative.  HENT: HENT negative.  Eyes: Eyes negative.  Cardiovascular: Cardiovascular negative.  GI: Positive for blood in stool.  Musculoskeletal: Musculoskeletal negative.  Skin: Skin negative.  Neurological: Positive for focal weakness, numbness and speech difficulty.  Hematologic: Hematologic/lymphatic negative.  Psychiatric: Psychiatric negative.   Objective:   Vitals:   07/28/19 0555  BP: (!) 125/47  Pulse: 67  Resp: 17  Temp: 97.9 F (36.6 C)    Body mass index is 27.27 kg/m.  Physical Exam  HENT:  Head: Normocephalic.  Nose: Nose normal.  Eyes:  Pupils: Pupils are equal, round, and reactive to light.  Neck:  Vascular: Carotid bruit present.  Comments: Bilateral carotid bruit Cardiovascular:  Pulses: Normal pulses.  Pulmonary:  Effort: Pulmonary effort is normal.  Abdominal:  General: Abdomen is flat.  Palpations: Abdomen is soft. There is no mass.  Musculoskeletal:  General: No swelling. Normal range of motion.  Skin:  General: Skin is warm.  Capillary Refill: Capillary refill takes less than 2 seconds.  Neurological:  General: No focal deficit present.  Mental Status: She is alert.  Psychiatric:  Mood and Affect: Mood normal.  Behavior: Behavior normal.  Thought Content: Thought content normal.   Data:  I have independently interpreted her carotid duplex she demonstrates 80 to 99% stenosis on the right and 60 to 79% stenosis on the left.   Assessment/Plan:   74 yo female with bilateral high-grade stenosis of her internal carotid arteries. I reviewed her CT scan with her appears to be high-grade stenosis on the right. Both lesions are heavily calcified. Left lesion was questionably symptomatic. She continues on  aspirin, Plavix, statin. Given the high calcification burden would not recommend stenting. I discussed with the patient for the left side medical management versus endarterectomy we will proceed with this in the near future given that it was a questionably symptomatic lesion. She will need a right carotid endarterectomy subsequent to recovery from the left side. We discussed the risk of stroke, myocardial infarction, cranial nerve injury, hematoma and other unforeseen imponderables and she demonstrates good understanding we will get her scheduled today.    Waynetta Sandy MD  [facility]

## 2019-07-28 NOTE — Anesthesia Procedure Notes (Signed)
Procedure Name: Intubation Date/Time: 07/28/2019 7:49 AM Performed by: Shirlyn Goltz, CRNA Pre-anesthesia Checklist: Patient identified, Emergency Drugs available, Suction available and Patient being monitored Patient Re-evaluated:Patient Re-evaluated prior to induction Oxygen Delivery Method: Circle system utilized Preoxygenation: Pre-oxygenation with 100% oxygen Induction Type: IV induction Ventilation: Mask ventilation without difficulty Laryngoscope Size: Miller and 2 Grade View: Grade I Tube type: Oral Tube size: 7.0 mm Number of attempts: 1 Airway Equipment and Method: Stylet Placement Confirmation: ETT inserted through vocal cords under direct vision,  positive ETCO2 and breath sounds checked- equal and bilateral Secured at: 21 cm Tube secured with: Tape Dental Injury: Teeth and Oropharynx as per pre-operative assessment

## 2019-07-28 NOTE — Anesthesia Procedure Notes (Signed)
Arterial Line Insertion Start/End4/27/2021 7:00 AM, 07/28/2019 7:10 AM Performed by: Shirlyn Goltz, CRNA  Patient location: Pre-op. Preanesthetic checklist: patient identified, IV checked, site marked, risks and benefits discussed, surgical consent, monitors and equipment checked, pre-op evaluation and anesthesia consent Lidocaine 1% used for infiltration Right, radial was placed Catheter size: 20 G Hand hygiene performed , maximum sterile barriers used  and Seldinger technique used Allen's test indicative of satisfactory collateral circulation Attempts: 1 Procedure performed without using ultrasound guided technique. Following insertion, Biopatch and dressing applied. Post procedure assessment: normal

## 2019-07-28 NOTE — Progress Notes (Signed)
CRITICAL VALUE ALERT  Critical Value:  Hemoglobin 6.9  Date & Time Notied:  07/28/2019 1459  Provider Notified: Gerri Lins, PA  Orders Received/Actions taken:

## 2019-07-28 NOTE — Progress Notes (Signed)
CRITICAL VALUE ALERT  Critical Value:  Hemoglobin 6.2 from A line   Date & Time Notied:  07/28/2019 1355  Provider Notified: Gerri Lins, PA  Orders Received/Actions taken: Lab to redraw

## 2019-07-28 NOTE — Anesthesia Preprocedure Evaluation (Addendum)
Anesthesia Evaluation  Patient identified by MRN, date of birth, ID band Patient awake    Reviewed: Allergy & Precautions, NPO status , Patient's Chart, lab work & pertinent test results, reviewed documented beta blocker date and time   History of Anesthesia Complications Negative for: history of anesthetic complications  Airway Mallampati: II  TM Distance: >3 FB Neck ROM: Full    Dental  (+) Missing, Dental Advisory Given   Pulmonary neg shortness of breath, neg sleep apnea, COPD,  COPD inhaler, neg recent URI, former smoker (quit 2015),  07/27/2019 SARS coronavirus NEG   breath sounds clear to auscultation       Cardiovascular hypertension, Pt. on medications and Pt. on home beta blockers (-) angina+ CAD (cath '20: Patent RCA stent with minimal ISR), + Past MI, + Cardiac Stents, + Peripheral Vascular Disease, +CHF and + DOE   Rhythm:Regular Rate:Normal  05/2019 ECHO: EF 60-65%, Grade II diastolic dysfunction,   4158: Low Risk = Normal stress Test. No sign of heart artery blockage that would cause symptoms. Normal EF.    Neuro/Psych neg Seizures TIAnegative psych ROS   GI/Hepatic Neg liver ROS, GERD  Controlled,  Endo/Other  negative endocrine ROS  Renal/GU Renal InsufficiencyRenal disease (creat 1.37)     Musculoskeletal   Abdominal   Peds  Hematology  (+) Blood dyscrasia, anemia , plavix   Anesthesia Other Findings   Reproductive/Obstetrics                           Anesthesia Physical Anesthesia Plan  ASA: III  Anesthesia Plan: General   Post-op Pain Management:    Induction: Intravenous  PONV Risk Score and Plan: 3 and Ondansetron, Dexamethasone and Treatment may vary due to age or medical condition  Airway Management Planned: Oral ETT  Additional Equipment: Arterial line  Intra-op Plan:   Post-operative Plan: Extubation in OR  Informed Consent: I have reviewed the  patients History and Physical, chart, labs and discussed the procedure including the risks, benefits and alternatives for the proposed anesthesia with the patient or authorized representative who has indicated his/her understanding and acceptance.     Dental advisory given  Plan Discussed with: CRNA and Surgeon  Anesthesia Plan Comments:        Anesthesia Quick Evaluation

## 2019-07-28 NOTE — Transfer of Care (Signed)
Immediate Anesthesia Transfer of Care Note  Patient: Kayla Mcclure  Procedure(s) Performed: ENDARTERECTOMY CAROTID (Left ) PATCH ANGIOPLASTY USING XENOSURE BIOLOGIC PATCH (Left Neck)  Patient Location: PACU  Anesthesia Type:General  Level of Consciousness: awake, alert , oriented and patient cooperative  Airway & Oxygen Therapy: Patient Spontanous Breathing and Patient connected to nasal cannula oxygen  Post-op Assessment: Report given to RN and Post -op Vital signs reviewed and stable  Post vital signs: Reviewed and stable  Last Vitals:  Vitals Value Taken Time  BP 90/44 07/28/19 0942  Temp    Pulse 76 07/28/19 0943  Resp 17 07/28/19 0943  SpO2 99 % 07/28/19 0943  Vitals shown include unvalidated device data.  Last Pain:  Vitals:   07/28/19 0606  TempSrc:   PainSc: 0-No pain         Complications: No apparent anesthesia complications

## 2019-07-29 LAB — CBC
HCT: 26.7 % — ABNORMAL LOW (ref 36.0–46.0)
Hemoglobin: 8.5 g/dL — ABNORMAL LOW (ref 12.0–15.0)
MCH: 25.8 pg — ABNORMAL LOW (ref 26.0–34.0)
MCHC: 31.8 g/dL (ref 30.0–36.0)
MCV: 80.9 fL (ref 80.0–100.0)
Platelets: 133 10*3/uL — ABNORMAL LOW (ref 150–400)
RBC: 3.3 MIL/uL — ABNORMAL LOW (ref 3.87–5.11)
RDW: 22.5 % — ABNORMAL HIGH (ref 11.5–15.5)
WBC: 11.3 10*3/uL — ABNORMAL HIGH (ref 4.0–10.5)
nRBC: 0 % (ref 0.0–0.2)

## 2019-07-29 LAB — BASIC METABOLIC PANEL
Anion gap: 15 (ref 5–15)
BUN: 25 mg/dL — ABNORMAL HIGH (ref 8–23)
CO2: 13 mmol/L — ABNORMAL LOW (ref 22–32)
Calcium: 7.8 mg/dL — ABNORMAL LOW (ref 8.9–10.3)
Chloride: 106 mmol/L (ref 98–111)
Creatinine, Ser: 1.33 mg/dL — ABNORMAL HIGH (ref 0.44–1.00)
GFR calc Af Amer: 46 mL/min — ABNORMAL LOW (ref >60)
GFR calc non Af Amer: 40 mL/min — ABNORMAL LOW (ref >60)
Glucose, Bld: 114 mg/dL — ABNORMAL HIGH (ref 70–99)
Potassium: 4.7 mmol/L (ref 3.5–5.1)
Sodium: 134 mmol/L — ABNORMAL LOW (ref 135–145)

## 2019-07-29 LAB — POCT ACTIVATED CLOTTING TIME: Activated Clotting Time: 230 seconds

## 2019-07-29 MED ORDER — OXYCODONE HCL 5 MG PO TABS: 5.0000 mg | ORAL_TABLET | ORAL | 0 refills | Status: DC | PRN

## 2019-07-29 NOTE — Discharge Summary (Addendum)
Discharge Summary     Kayla Mcclure 02-Jun-1945 74 y.o. female  [id number]  Admission Date: 07/28/2019  Discharge Date: 07/29/2019  Physician: Thomes Lolling*  Admission Diagnosis: Carotid artery stenosis [I65.29] Carotid stenosis [I65.29]   HPI:   This is a 74 y.o. female with history of left sided TIA resulting in slurred speech and right arm numbness.  She is now indicated for left carotid endarterectomy.  Also has 80 to 90% stenosis of the right ICA and will need interval right carotid endarterectomy.   Hospital Course:  The patient was admitted to the hospital and taken to the operating room on 07/28/2019 and underwent left carotid endarterectomy.    Findings: There is highly calcified lesion just at the bifurcation.  Shunt was placed.  At completion there were expected Doppler signals with low resistance waveform in the ICA distal.  The pt tolerated the procedure well and was transported to the PACU in good condition.  She did have an acute surgical blood loss anemia and was transfused one unit.     By POD 1, the pt neuro status was in tact.  Tongue midline and no dysphagia.  hgb remained stable.  She is discharged home.   The remainder of the hospital course consisted of increasing mobilization and increasing intake of solids without difficulty.   Recent Labs    07/29/19 0334  NA 134*  K 4.7  CL 106  CO2 13*  GLUCOSE 114*  BUN 25*  CALCIUM 7.8*   Recent Labs    07/28/19 1435 07/28/19 1435 07/28/19 2320 07/29/19 0334  WBC 8.5  --   --  11.3*  HGB 6.9*   < > 8.4* 8.5*  HCT 23.3*   < > 26.6* 26.7*  PLT 137*  --   --  133*   < > = values in this interval not displayed.   No results for input(s): INR in the last 72 hours.   Discharge Instructions    Discharge patient   Complete by: As directed    Please tell pt to take 1/2 her metoprolol dose tonight and resume normal dose tomorrow. I also put this in the dc instructions.  thx   Discharge disposition: 01-Home or Self Care   Discharge patient date: 07/29/2019      Discharge Diagnosis:  Carotid artery stenosis [I65.29] Carotid stenosis [I65.29]  Secondary Diagnosis: Patient Active Problem List   Diagnosis Date Noted  . Carotid artery stenosis 07/28/2019  . Carotid stenosis 07/28/2019  . Language difficulty 05/19/2019  . Paresthesia 05/19/2019  . Chronic diastolic heart failure ([place])   . Acute renal failure superimposed on stage 2 chronic kidney disease ([place]) 05/12/2019  . Syncope 05/12/2019  . Chronic diastolic CHF (congestive heart failure) ([place]) 05/12/2019  . Progressive angina ([place]) 02/04/2019  . Coronary artery disease involving native coronary artery of native heart with angina pectoris ([place]) 02/04/2019  . Acute blood loss anemia   . Symptomatic anemia 12/19/2015  . DOE (dyspnea on exertion) 09/03/2015  . Orthostatic dizziness 07/13/2015  . Bilateral carotid artery disease ([place]) 07/08/2015  . Former heavy cigarette smoker (20-39 per day) 02/04/2014  . GI bleed 09/27/2013  . Anemia 09/11/2013  . Claudication ([place]) 05/04/2013  . Claudication of left lower extremity > right 03/06/2013  . Dyslipidemia, goal LDL below 70 12/23/2012  . PAD (peripheral artery disease) - bilateral CIA & CFA 50-69%; Claudication 12/22/2012  . Obesity (BMI 30-39.9) 12/22/2012  . Internal hemorrhoids 04/11/2010  . Diverticulosis 04/11/2010  .  Chronic insomnia 10/07/2009  . Iron deficiency anemia 09/23/2008  . Chronic back pain 09/23/2008  . CKD, stage 2 08/15/2008  . Essential hypertension, benign 10/31/2007  . Asthma 10/31/2007  . CAD S/P percutaneous coronary angioplasty -- Promus DES 2.5 mm x 20 mm postdilated to 3 mm 08/05/2007  . History of MI (myocardial infarction)  - NSTEMI with PCI of RCA. 08/05/2007   Past Medical History:  Diagnosis Date  . Asthma  10/31/2007  . CAD (coronary artery disease) 08/05/2007   a) NSTEMI 2009: PCI to the RCA Promus DES 2.5 mm x  23 mm (3.0 mm). b) Myoview June 2017: Nonspecific ST changes. EF greater than 65%. LOW RISK, normal study. No ischemia or infarction.  . Carotid artery disease ([place])    a. 50-69% RICA by outpt dopp 04/22/13, f/u due 04/2014.  Marland Kitchen Chronic back pain   . Chronic diastolic heart failure ([place])   . CKD (chronic kidney disease), stage II   . Diverticulosis   04/11/2010    Colonoscopy  . Dyslipidemia, goal LDL below 70   . Essential hypertension    a. Normal renal arteries by PV angio 05/2013.  Marland Kitchen GERD (gastroesophageal reflux disease)   . GI bleed 12/2015  . History of recent blood transfusion 12/2015   12-19-15, 12-20-15 and 12-21-15  . Insomnia   . Internal hemorrhoids  04/11/2010   By colonoscopy  . Iron deficiency anemia   . Myocardial infarction ([place]) 2009  . Peripheral arterial disease ([place])    a. s/p PTA and stenting of L CIA stenosis 05/2013.  Marland Kitchen Renal insufficiency   . Tobacco abuse    Quit in 05/2013    Allergies as of 07/29/2019   No Known Allergies     Medication List    TAKE these medications   albuterol 108 (90 Base) MCG/ACT inhaler Commonly known as: VENTOLIN HFA Inhale 2 puffs into the lungs every 6 (six) hours as needed for wheezing or shortness of breath.   aspirin EC 81 MG tablet Take 81 mg by mouth daily.   atorvastatin 80 MG tablet Commonly known as: LIPITOR Take 1 tablet (80 mg total) by mouth daily.   Breo Ellipta 100-25 MCG/INH Aepb Generic drug: fluticasone furoate-vilanterol Inhale 1 puff into the lungs daily.   clopidogrel 75 MG tablet Commonly known as: PLAVIX Take 1 tablet (75 mg total) by mouth daily.   ezetimibe 10 MG tablet Commonly known as: ZETIA Take 1 tablet (10 mg total) by mouth daily.   ferrous gluconate 324 MG tablet Commonly known as: FERGON Take 1 tablet (324 mg total) by mouth 2 (two) times daily with a meal.   gabapentin 300 MG capsule Commonly known as: NEURONTIN Take 1 capsule (300 mg total) by mouth 2 (two) times daily.     isosorbide mononitrate 60 MG 24 hr tablet Commonly known as: IMDUR Take 1.5 tablets (90 mg total) by mouth daily.   metoprolol succinate 100 MG 24 hr tablet Commonly known as: TOPROL-XL Take 2 tablets (200 mg total) by mouth at bedtime.   nitroGLYCERIN 0.4 MG SL tablet Commonly known as: NITROSTAT Place 1 tablet (0.4 mg total) under the tongue every 5 (five) minutes as needed for chest pain.   oxyCODONE 5 MG immediate release tablet Commonly known as: Oxy IR/ROXICODONE Take 1 tablet (5 mg total) by mouth every 4 (four) hours as needed for moderate pain.   pantoprazole 40 MG tablet Commonly known as: PROTONIX Take 1 tablet (40 mg total) by mouth  daily.        [facility] Discharge Instructions Carotid Endarterectomy (CEA)  Please refer to the following instructions for your post-procedure care. Your surgeon or physician assistant will discuss any changes with you.  Activity  You are encouraged to walk as much as you can. You can slowly return to normal activities but must avoid strenuous activity and heavy lifting until your doctor tell you it's OK. Avoid activities such as vacuuming or swinging a golf club. You can drive after one week if you are comfortable and you are no longer taking prescription pain medications. It is normal to feel tired for serval weeks after your surgery. It is also normal to have difficulty with sleep habits, eating, and bowel movements after surgery. These will go away with time.  Bathing/Showering  You may shower after you come home. Do not soak in a bathtub, hot tub, or swim until the incision heals completely.  Incision Care  Shower every day. Clean your incision with mild soap and water. Pat the area dry with a clean towel. You do not need a bandage unless otherwise instructed. Do not apply any ointments or creams to your incision. You may have skin glue on your incision. Do not peel it off. It will come off on  its own in about one week. Your incision may feel thickened and raised for several weeks after your surgery. This is normal and the skin will soften over time. For Men Only: It's OK to shave around the incision but do not shave the incision itself for 2 weeks. It is common to have numbness under your chin that could last for several months.  Diet  Resume your normal diet. There are no special food restrictions following this procedure. A low fat/low cholesterol diet is recommended for all patients with vascular disease. In order to heal from your surgery, it is CRITICAL to get adequate nutrition. Your body requires vitamins, minerals, and protein. Vegetables are the best source of vitamins and minerals. Vegetables also provide the perfect balance of protein. Processed food has little nutritional value, so try to avoid this.  Medications  Resume taking all of your medications unless your doctor or physician assistant tells you not to.  If your incision is causing pain, you may take over-the- counter pain relievers such as acetaminophen (Tylenol). If you were prescribed a stronger pain medication, please be aware these medications can cause nausea and constipation.  Prevent nausea by taking the medication with a snack or meal. Avoid constipation by drinking plenty of fluids and eating foods with a high amount of fiber, such as fruits, vegetables, and grains.  Do not take Tylenol if you are taking prescription pain medications.  Take 1/2 dose of your metoprolol tonight (100mg ) and then resume normal dose tomorrow.   Follow Up  Our office will schedule a follow up appointment 2-3 weeks following discharge.  Please call us immediately for any of the following conditions  . Increased pain, redness, drainage (pus) from your incision site. . Fever of 101 degrees or higher. . If you should develop stroke (slurred speech, difficulty swallowing, weakness on one side of your body, loss of vision) you should  call 911 and go to the nearest emergency room. .  Reduce your risk of vascular disease:  . Stop smoking. If you would like help call [facility] at 1-800-QUIT-NOW 848-715-9108) or [place] at [phone]. . Manage your cholesterol . Maintain a desired weight . Control your  diabetes . Keep your blood pressure down .  If you have any questions, please call the office at [phone].  Prescriptions given: 1.   Roxicet #8 No Refill  Disposition: home  Patient's condition: is Good  Follow up: 1. Dr. Donzetta Matters in 2-3 weeks.   Leontine Locket, PA-C [facility] [phone]   --- For [facility] use ---   Modified Rankin score at D/C (0-6): 0  IV medication needed for:  1. Hypertension: No 2. Hypotension: No  Post-op Complications: No  1. Post-op CVA or TIA: No  If yes: Event classification (right eye, left eye, right cortical, left cortical, verterobasilar, other): n/a  If yes: Timing of event (intra-op, <6 hrs post-op, >=6 hrs post-op, unknown): n/a  2. CN injury: No  If yes: CN n/a injuried   3. Myocardial infarction: No  If yes: Dx by (EKG or clinical, Troponin): n/a  4.  CHF: No  5.  Dysrhythmia (new): No  6. Wound infection: No  7. Reperfusion symptoms: No  8. Return to OR: No  If yes: return to OR for (bleeding, neurologic, other CEA incision, other): n/a  Discharge medications: Statin use:  Yes ASA use:  Yes   Beta blocker use:  Yes ACE-Inhibitor use:  No  ARB use:  No CCB use: No P2Y12 Antagonist use: Yes, [x ] Plavix, [ ]  Plasugrel, [ ]  Ticlopinine, [ ]  Ticagrelor, [ ]  Other, [ ]  No for medical reason, [ ]  Non-compliant, [ ]  Not-indicated Anti-coagulant use:  No, [ ]  Warfarin, [ ]  Rivaroxaban, [ ]  Dabigatran,

## 2019-07-29 NOTE — Discharge Instructions (Signed)
[facility]  Discharge Instructions   Carotid Endarterectomy (CEA)  Please refer to the following instructions for your post-procedure care. Your surgeon or physician assistant will discuss any changes with you.  Activity  You are encouraged to walk as much as you can. You can slowly return to normal activities but must avoid strenuous activity and heavy lifting until your doctor tell you it's okay. Avoid activities such as vacuuming or swinging a golf club. You can drive after one week if you are comfortable and you are no longer taking prescription pain medications. It is normal to feel tired for serval weeks after your surgery. It is also normal to have difficulty with sleep habits, eating, and bowel movements after surgery. These will go away with time.  Bathing/Showering  Shower daily after you go home. Do not soak in a bathtub, hot tub, or swim until the incision heals completely.  Incision Care  Shower every day. Clean your incision with mild soap and water. Pat the area dry with a clean towel. You do not need a bandage unless otherwise instructed. Do not apply any ointments or creams to your incision. You may have skin glue on your incision. Do not peel it off. It will come off on its own in about one week. Your incision may feel thickened and raised for several weeks after your surgery. This is normal and the skin will soften over time.   For Men Only: It's okay to shave around the incision but do not shave the incision itself for 2 weeks. It is common to have numbness under your chin that could last for several months.  Diet  Resume your normal diet. There are no special food restrictions following this procedure. A low fat/low cholesterol diet is recommended for all patients with vascular disease. In order to heal from your surgery, it is CRITICAL to get adequate nutrition. Your body requires vitamins, minerals, and protein. Vegetables are the  best source of vitamins and minerals. Vegetables also provide the perfect balance of protein. Processed food has little nutritional value, so try to avoid this.  Medications  Resume taking all of your medications unless your doctor or physician assistant tells you not to. If your incision is causing pain, you may take over-the- counter pain relievers such as acetaminophen (Tylenol). If you were prescribed a stronger pain medication, please be aware these medications can cause nausea and constipation. Prevent nausea by taking the medication with a snack or meal. Avoid constipation by drinking plenty of fluids and eating foods with a high amount of fiber, such as fruits, vegetables, and grains.  Do not take Tylenol if you are taking prescription pain medications.  Take 1/2 dose of your metoprolol tonight (100mg ) and then resume normal dose tomorrow.   Follow Up  Our office will schedule a follow up appointment 2-3 weeks following discharge.  Please call us immediately for any of the following conditions  . Increased pain, redness, drainage (pus) from your incision site. . Fever of 101 degrees or higher. . If you should develop stroke (slurred speech, difficulty swallowing, weakness on one side of your body, loss of vision) you should call 911 and go to the nearest emergency room. .  Reduce your risk of vascular disease:  . Stop smoking. If you would like help call [facility] at 1-800-QUIT-NOW (617) 389-1496) or [place] at ([phone]. . Manage your cholesterol . Maintain a desired weight . Control your diabetes . Keep your blood pressure  down .  If you have any questions, please call the office at [phone].

## 2019-07-29 NOTE — Plan of Care (Signed)
  Problem: Activity: Goal: Risk for activity intolerance will decrease Outcome: Progressing   Problem: Nutrition: Goal: Adequate nutrition will be maintained Outcome: Progressing   

## 2019-07-29 NOTE — Plan of Care (Signed)
  Problem: Pain Managment: Goal: General experience of comfort will improve Outcome: Not Progressing   Problem: Clinical Measurements: Goal: Ability to maintain clinical measurements within normal limits will improve Outcome: Not Progressing   Problem: Education: Goal: Knowledge of General Education information will improve Description: Including pain rating scale, medication(s)/side effects and non-pharmacologic comfort measures Outcome: Progressing

## 2019-07-29 NOTE — Plan of Care (Signed)
Continue to monitor

## 2019-07-29 NOTE — Progress Notes (Addendum)
  Progress Note    07/29/2019 7:14 AM 1 Day Post-Op  Subjective:  Doing good this morning.  Says the only time she has pain is when she coughs.  No trouble swallowing-just sore throat that is better this am.   Tm 99.2 now afebrile HR 60's-90's NSR 01'U-272'Z systolic 366% 4QI3KV  GTTS:  None   Vitals:   07/29/19 0315 07/29/19 0412  BP: (!) 104/46 (!) 107/50  Pulse: 66 74  Resp: 16 16  Temp: 99.2 F (37.3 C) 98.7 F (37.1 C)  SpO2:  99%     Physical Exam: Neuro:  In tact; tongue is midline Lungs:  Non labored Incision:  Clean and dry  CBC    Component Value Date/Time   WBC 11.3 (H) 07/29/2019 0334   RBC 3.30 (L) 07/29/2019 0334   HGB 8.5 (L) 07/29/2019 0334   HGB 12.0 02/05/2019 0836   HCT 26.7 (L) 07/29/2019 0334   HCT 38.5 02/05/2019 0836   PLT 133 (L) 07/29/2019 0334   PLT 212 02/05/2019 0836   MCV 80.9 07/29/2019 0334   MCV 81 02/05/2019 0836   MCH 25.8 (L) 07/29/2019 0334   MCHC 31.8 07/29/2019 0334   RDW 22.5 (H) 07/29/2019 0334   RDW 17.3 (H) 02/05/2019 0836   LYMPHSABS 1,702 07/13/2015 1135   MONOABS 888 07/13/2015 1135   EOSABS 148 07/13/2015 1135   BASOSABS 0 07/13/2015 1135    BMET    Component Value Date/Time   NA 134 (L) 07/29/2019 0334   NA 140 02/05/2019 0836   K 4.7 07/29/2019 0334   CL 106 07/29/2019 0334   CO2 13 (L) 07/29/2019 0334   GLUCOSE 114 (H) 07/29/2019 0334   BUN 25 (H) 07/29/2019 0334   BUN 12 02/05/2019 0836   CREATININE 1.33 (H) 07/29/2019 0334   CREATININE 1.31 (H) 05/07/2013 0947   CALCIUM 7.8 (L) 07/29/2019 0334   GFRNONAA 40 (L) 07/29/2019 0334   GFRAA 46 (L) 07/29/2019 0334     Intake/Output Summary (Last 24 hours) at 07/29/2019 0714 Last data filed at 07/29/2019 0251 Gross per 24 hour  Intake 3530.25 ml  Output 600 ml  Net 2930.25 ml     Assessment/Plan:  This is a 73 y.o. female who is s/p left CEA 1 Day Post-Op  -pt is doing well this am. -pt neuro exam is in tact; no difficulty swallowing -acute  surgical blood loss anemia-received one unit PRBC's; hgb improved this am -pt has ambulated -pt has voided -f/u with Dr. Donzetta Matters in 2-3 weeks.   Kayla Locket, PA-C [facility] [phone]  I have independently interviewed and examined patient and agree with PA assessment and plan above.  Okay for discharge today.  We will follow-up in a few weeks and will need to plan interval right-sided carotid endarterectomy.  Renaldo Gornick C. Donzetta Matters, MD [facility] [place] Office: [phone] Pager: [phone]

## 2019-07-29 NOTE — Progress Notes (Signed)
Discharge instructions given to Kayla Mcclure.  Discussed new medications, medication changes, and side effects.. Discussed signs and symptoms to watch for and when to contact the physician.  Discussed follow up appointments and activities.  Verbalized understanding.

## 2019-07-30 LAB — TYPE AND SCREEN
ABO/RH(D): A POS
Antibody Screen: NEGATIVE
Unit division: 0
Unit division: 0

## 2019-07-30 LAB — BPAM RBC
Blood Product Expiration Date: 202104292359
Blood Product Expiration Date: 202105012359
ISSUE DATE / TIME: 202104271622
Unit Type and Rh: 6200
Unit Type and Rh: 6200

## 2019-07-31 NOTE — Anesthesia Postprocedure Evaluation (Signed)
Anesthesia Post Note  Patient: Kayla Mcclure  Procedure(s) Performed: ENDARTERECTOMY CAROTID (Left ) PATCH ANGIOPLASTY USING XENOSURE BIOLOGIC PATCH (Left Neck)     Patient location during evaluation: PACU Anesthesia Type: General Level of consciousness: awake and alert Pain management: pain level controlled Vital Signs Assessment: post-procedure vital signs reviewed and stable Respiratory status: spontaneous breathing, nonlabored ventilation, respiratory function stable and patient connected to nasal cannula oxygen Cardiovascular status: blood pressure returned to baseline and stable Postop Assessment: no apparent nausea or vomiting Anesthetic complications: no    Last Vitals:  Vitals:   07/29/19 1100 07/29/19 1104  BP: (!) 118/55 (!) 118/55  Pulse:  67  Resp: 11 11  Temp: 36.9 C 36.9 C  SpO2: 100% 100%    Last Pain:  Vitals:   07/29/19 1104  TempSrc:   PainSc: (P) 0-No pain                 Monaye Blackie

## 2019-08-06 ENCOUNTER — Other Ambulatory Visit: Payer: Self-pay

## 2019-08-06 ENCOUNTER — Encounter (HOSPITAL_COMMUNITY): Payer: Self-pay | Admitting: Emergency Medicine

## 2019-08-06 ENCOUNTER — Inpatient Hospital Stay (HOSPITAL_COMMUNITY)
Admission: EM | Admit: 2019-08-06 | Discharge: 2019-08-09 | DRG: 378 | Disposition: A | Discharge status: Home / Self Care | Payer: Medicare Other | Attending: Internal Medicine | Admitting: Internal Medicine

## 2019-08-06 DIAGNOSIS — Z87891 Personal history of nicotine dependence: Secondary | ICD-10-CM | POA: Diagnosis not present

## 2019-08-06 DIAGNOSIS — K219 Gastro-esophageal reflux disease without esophagitis: Secondary | ICD-10-CM | POA: Diagnosis present

## 2019-08-06 DIAGNOSIS — Z9861 Coronary angioplasty status: Secondary | ICD-10-CM

## 2019-08-06 DIAGNOSIS — J45909 Unspecified asthma, uncomplicated: Secondary | ICD-10-CM | POA: Diagnosis present

## 2019-08-06 DIAGNOSIS — K5521 Angiodysplasia of colon with hemorrhage: Secondary | ICD-10-CM | POA: Diagnosis present

## 2019-08-06 DIAGNOSIS — G8929 Other chronic pain: Secondary | ICD-10-CM | POA: Diagnosis present

## 2019-08-06 DIAGNOSIS — Z9049 Acquired absence of other specified parts of digestive tract: Secondary | ICD-10-CM | POA: Diagnosis not present

## 2019-08-06 DIAGNOSIS — I2510 Atherosclerotic heart disease of native coronary artery without angina pectoris: Secondary | ICD-10-CM | POA: Diagnosis not present

## 2019-08-06 DIAGNOSIS — G4700 Insomnia, unspecified: Secondary | ICD-10-CM | POA: Diagnosis present

## 2019-08-06 DIAGNOSIS — D649 Anemia, unspecified: Secondary | ICD-10-CM | POA: Diagnosis not present

## 2019-08-06 DIAGNOSIS — I10 Essential (primary) hypertension: Secondary | ICD-10-CM | POA: Diagnosis present

## 2019-08-06 DIAGNOSIS — Z20822 Contact with and (suspected) exposure to covid-19: Secondary | ICD-10-CM | POA: Diagnosis present

## 2019-08-06 DIAGNOSIS — K922 Gastrointestinal hemorrhage, unspecified: Secondary | ICD-10-CM | POA: Diagnosis present

## 2019-08-06 DIAGNOSIS — Z7982 Long term (current) use of aspirin: Secondary | ICD-10-CM | POA: Diagnosis not present

## 2019-08-06 DIAGNOSIS — K921 Melena: Secondary | ICD-10-CM | POA: Diagnosis present

## 2019-08-06 DIAGNOSIS — I739 Peripheral vascular disease, unspecified: Secondary | ICD-10-CM | POA: Diagnosis present

## 2019-08-06 DIAGNOSIS — D62 Acute posthemorrhagic anemia: Secondary | ICD-10-CM | POA: Diagnosis present

## 2019-08-06 DIAGNOSIS — I130 Hypertensive heart and chronic kidney disease with heart failure and stage 1 through stage 4 chronic kidney disease, or unspecified chronic kidney disease: Secondary | ICD-10-CM | POA: Diagnosis present

## 2019-08-06 DIAGNOSIS — N183 Chronic kidney disease, stage 3 unspecified: Secondary | ICD-10-CM | POA: Diagnosis present

## 2019-08-06 DIAGNOSIS — M549 Dorsalgia, unspecified: Secondary | ICD-10-CM | POA: Diagnosis present

## 2019-08-06 DIAGNOSIS — Z955 Presence of coronary angioplasty implant and graft: Secondary | ICD-10-CM

## 2019-08-06 DIAGNOSIS — E785 Hyperlipidemia, unspecified: Secondary | ICD-10-CM | POA: Diagnosis present

## 2019-08-06 DIAGNOSIS — N1831 Chronic kidney disease, stage 3a: Secondary | ICD-10-CM | POA: Diagnosis present

## 2019-08-06 DIAGNOSIS — I252 Old myocardial infarction: Secondary | ICD-10-CM

## 2019-08-06 DIAGNOSIS — I25118 Atherosclerotic heart disease of native coronary artery with other forms of angina pectoris: Secondary | ICD-10-CM | POA: Diagnosis present

## 2019-08-06 DIAGNOSIS — I5032 Chronic diastolic (congestive) heart failure: Secondary | ICD-10-CM | POA: Diagnosis present

## 2019-08-06 DIAGNOSIS — Z7902 Long term (current) use of antithrombotics/antiplatelets: Secondary | ICD-10-CM | POA: Diagnosis not present

## 2019-08-06 DIAGNOSIS — K5520 Angiodysplasia of colon without hemorrhage: Secondary | ICD-10-CM | POA: Diagnosis present

## 2019-08-06 DIAGNOSIS — Z8249 Family history of ischemic heart disease and other diseases of the circulatory system: Secondary | ICD-10-CM

## 2019-08-06 DIAGNOSIS — Z79899 Other long term (current) drug therapy: Secondary | ICD-10-CM | POA: Diagnosis not present

## 2019-08-06 DIAGNOSIS — Z79891 Long term (current) use of opiate analgesic: Secondary | ICD-10-CM

## 2019-08-06 LAB — CBC WITH DIFFERENTIAL/PLATELET
Abs Immature Granulocytes: 0.03 10*3/uL (ref 0.00–0.07)
Basophils Absolute: 0 10*3/uL (ref 0.0–0.1)
Basophils Relative: 0 %
Eosinophils Absolute: 0.1 10*3/uL (ref 0.0–0.5)
Eosinophils Relative: 2 %
HCT: 20 % — ABNORMAL LOW (ref 36.0–46.0)
Hemoglobin: 5.8 g/dL — CL (ref 12.0–15.0)
Immature Granulocytes: 0 %
Lymphocytes Relative: 21 %
Lymphs Abs: 1.7 10*3/uL (ref 0.7–4.0)
MCH: 25.4 pg — ABNORMAL LOW (ref 26.0–34.0)
MCHC: 29 g/dL — ABNORMAL LOW (ref 30.0–36.0)
MCV: 87.7 fL (ref 80.0–100.0)
Monocytes Absolute: 0.9 10*3/uL (ref 0.1–1.0)
Monocytes Relative: 11 %
Neutro Abs: 5.2 10*3/uL (ref 1.7–7.7)
Neutrophils Relative %: 66 %
Platelets: 241 10*3/uL (ref 150–400)
RBC: 2.28 MIL/uL — ABNORMAL LOW (ref 3.87–5.11)
RDW: 22.3 % — ABNORMAL HIGH (ref 11.5–15.5)
WBC: 8 10*3/uL (ref 4.0–10.5)
nRBC: 0 % (ref 0.0–0.2)

## 2019-08-06 LAB — COMPREHENSIVE METABOLIC PANEL
ALT: 9 U/L (ref 0–44)
AST: 16 U/L (ref 15–41)
Albumin: 3 g/dL — ABNORMAL LOW (ref 3.5–5.0)
Alkaline Phosphatase: 43 U/L (ref 38–126)
Anion gap: 6 (ref 5–15)
BUN: 21 mg/dL (ref 8–23)
CO2: 23 mmol/L (ref 22–32)
Calcium: 8.3 mg/dL — ABNORMAL LOW (ref 8.9–10.3)
Chloride: 112 mmol/L — ABNORMAL HIGH (ref 98–111)
Creatinine, Ser: 1.24 mg/dL — ABNORMAL HIGH (ref 0.44–1.00)
GFR calc Af Amer: 50 mL/min — ABNORMAL LOW (ref >60)
GFR calc non Af Amer: 43 mL/min — ABNORMAL LOW (ref >60)
Glucose, Bld: 106 mg/dL — ABNORMAL HIGH (ref 70–99)
Potassium: 5.1 mmol/L (ref 3.5–5.1)
Sodium: 141 mmol/L (ref 135–145)
Total Bilirubin: 0.8 mg/dL (ref 0.3–1.2)
Total Protein: 6.4 g/dL — ABNORMAL LOW (ref 6.5–8.1)

## 2019-08-06 LAB — POC OCCULT BLOOD, ED: Fecal Occult Bld: POSITIVE — AB

## 2019-08-06 LAB — RESPIRATORY PANEL BY RT PCR (FLU A&B, COVID)
Influenza A by PCR: NEGATIVE
Influenza B by PCR: NEGATIVE
SARS Coronavirus 2 by RT PCR: NEGATIVE

## 2019-08-06 LAB — PREPARE RBC (CROSSMATCH)

## 2019-08-06 MED ORDER — FLUTICASONE FUROATE-VILANTEROL 100-25 MCG/INH IN AEPB
1.0000 | INHALATION_SPRAY | Freq: Every day | RESPIRATORY_TRACT | Status: DC
  Administered 2019-08-08 – 2019-08-09 (×2): 1 via RESPIRATORY_TRACT
  Filled 2019-08-06: qty 28

## 2019-08-06 MED ORDER — ONDANSETRON HCL 4 MG PO TABS: 4.0000 mg | ORAL_TABLET | Freq: Four times a day (QID) | ORAL | Status: DC | PRN

## 2019-08-06 MED ORDER — ONDANSETRON HCL 4 MG/2ML IJ SOLN: 4.0000 mg | Freq: Four times a day (QID) | INTRAMUSCULAR | Status: DC | PRN

## 2019-08-06 MED ORDER — SODIUM CHLORIDE 0.9 % IV SOLN: INTRAVENOUS | Status: AC

## 2019-08-06 MED ORDER — PANTOPRAZOLE SODIUM 40 MG IV SOLR: 40.0000 mg | Freq: Two times a day (BID) | INTRAVENOUS | Status: DC

## 2019-08-06 MED ORDER — ACETAMINOPHEN 650 MG RE SUPP: 650.0000 mg | Freq: Four times a day (QID) | RECTAL | Status: DC | PRN

## 2019-08-06 MED ORDER — SODIUM CHLORIDE 0.9% FLUSH
3.0000 mL | Freq: Two times a day (BID) | INTRAVENOUS | Status: DC
  Administered 2019-08-07 – 2019-08-09 (×3): 3 mL via INTRAVENOUS

## 2019-08-06 MED ORDER — PANTOPRAZOLE SODIUM 40 MG IV SOLR
40.0000 mg | Freq: Two times a day (BID) | INTRAVENOUS | Status: DC
  Administered 2019-08-06 – 2019-08-08 (×4): 40 mg via INTRAVENOUS
  Filled 2019-08-06 (×4): qty 40

## 2019-08-06 MED ORDER — SODIUM CHLORIDE 0.9% IV SOLUTION
Freq: Once | INTRAVENOUS | Status: AC
  Administered 2019-08-08: 200 mL/h via INTRAVENOUS

## 2019-08-06 MED ORDER — ACETAMINOPHEN 325 MG PO TABS
650.0000 mg | ORAL_TABLET | Freq: Four times a day (QID) | ORAL | Status: DC | PRN
  Administered 2019-08-08: 650 mg via ORAL
  Filled 2019-08-06: qty 2

## 2019-08-06 MED ORDER — ALBUTEROL SULFATE (2.5 MG/3ML) 0.083% IN NEBU: 2.5000 mg | INHALATION_SOLUTION | RESPIRATORY_TRACT | Status: DC | PRN

## 2019-08-06 NOTE — ED Notes (Signed)
RN requested update r/t 2 units of blood ordered for pt. Blood bank rep. Informed this RN blood is not ready

## 2019-08-06 NOTE — ED Provider Notes (Signed)
[facility] EMERGENCY DEPARTMENT Provider Note   CSN: [id number] Arrival date & time: 08/06/19  1554     History Chief Complaint  Patient presents with  . GI Bleed    Kayla Mcclure is a 74 y.o. female.  74 yo F with a chief complaints of dark stool and fatigue.  Patient saw her family doctor today for the same.  Had her blood level checked and saw that it had dropped from prior.  Was sent to the ED for transfusion.  This been an ongoing issue for the patient.  She has had issues where she had come to the hospital and get transfusions.  Has chronic history of GI bleeding.  Also has iron deficiency.  She denies abdominal pain.  Denies nausea or vomiting.   The history is provided by the patient.  Illness Severity:  Moderate Onset quality:  Gradual Duration:  2 days Timing:  Constant Progression:  Worsening Chronicity:  Recurrent Associated symptoms: shortness of breath (on exertion)   Associated symptoms: no abdominal pain, no chest pain, no congestion, no fever, no headaches, no myalgias, no nausea, no rhinorrhea, no vomiting and no wheezing        Past Medical History:  Diagnosis Date  . Asthma  10/31/2007  . CAD (coronary artery disease) 08/05/2007   a) NSTEMI 2009: PCI to the RCA Promus DES 2.5 mm x 23 mm (3.0 mm). b) Myoview June 2017: Nonspecific ST changes. EF greater than 65%. LOW RISK, normal study. No ischemia or infarction.  . Carotid artery disease ([place])    a. 50-69% RICA by outpt dopp 04/22/13, f/u due 04/2014.  Marland Kitchen Chronic back pain   . Chronic diastolic heart failure ([place])   . CKD (chronic kidney disease), stage II   . Diverticulosis   04/11/2010    Colonoscopy  . Dyslipidemia, goal LDL below 70   . Essential hypertension    a. Normal renal arteries by PV angio 05/2013.  Marland Kitchen GERD (gastroesophageal reflux disease)   . GI bleed 12/2015  . History of recent blood transfusion 12/2015   12-19-15, 12-20-15 and 12-21-15  . Insomnia   .  Internal hemorrhoids  04/11/2010   By colonoscopy  . Iron deficiency anemia   . Myocardial infarction ([place]) 2009  . Peripheral arterial disease ([place])    a. s/p PTA and stenting of L CIA stenosis 05/2013.  Marland Kitchen Renal insufficiency   . Tobacco abuse    Quit in 05/2013    Patient Active Problem List   Diagnosis Date Noted  . Carotid artery stenosis 07/28/2019  . Carotid stenosis 07/28/2019  . Language difficulty 05/19/2019  . Paresthesia 05/19/2019  . Chronic diastolic heart failure ([place])   . Acute renal failure superimposed on stage 2 chronic kidney disease ([place]) 05/12/2019  . Syncope 05/12/2019  . Chronic diastolic CHF (congestive heart failure) ([place]) 05/12/2019  . Progressive angina ([place]) 02/04/2019  . Coronary artery disease involving native coronary artery of native heart with angina pectoris ([place]) 02/04/2019  . Acute blood loss anemia   . Symptomatic anemia 12/19/2015  . DOE (dyspnea on exertion) 09/03/2015  . Orthostatic dizziness 07/13/2015  . Bilateral carotid artery disease ([place]) 07/08/2015  . Former heavy cigarette smoker (20-39 per day) 02/04/2014  . GI bleed 09/27/2013  . Anemia 09/11/2013  . Claudication ([place]) 05/04/2013  . Claudication of left lower extremity > right 03/06/2013  . Dyslipidemia, goal LDL below 70 12/23/2012  . PAD (peripheral artery disease) - bilateral CIA &  CFA 50-69%; Claudication 12/22/2012  . Obesity (BMI 30-39.9) 12/22/2012  . Internal hemorrhoids 04/11/2010  . Diverticulosis 04/11/2010  . Chronic insomnia 10/07/2009  . Iron deficiency anemia 09/23/2008  . Chronic back pain 09/23/2008  . CKD, stage 2 08/15/2008  . Essential hypertension, benign 10/31/2007  . Asthma 10/31/2007  . CAD S/P percutaneous coronary angioplasty -- Promus DES 2.5 mm x 20 mm postdilated to 3 mm 08/05/2007  . History of MI (myocardial infarction)  - NSTEMI with PCI of RCA. 08/05/2007    Past Surgical History:  Procedure Laterality Date  . CHOLECYSTECTOMY  1975  .  COLONOSCOPY  04/11/2010  . colonscopy  11/30/2015  . CORONARY ANGIOPLASTY WITH STENT PLACEMENT  08/05/2007   Promus DES 2.5 mm x 23 mm (3 mm)  to RCA; EF 60-70%  . ENDARTERECTOMY Left 07/28/2019   Procedure: ENDARTERECTOMY CAROTID;  Surgeon: Waynetta Sandy, MD;  Location: [facility];  Service: Vascular;  Laterality: Left;  . ENTEROSCOPY N/A 12/27/2015   Procedure: ENTEROSCOPY;  Surgeon: Clarene Essex, MD;  Location: [facility] ENDOSCOPY;  Service: Endoscopy;  Laterality: N/A;  also needs slim egd scope  . ENTEROSCOPY N/A 05/15/2019   Procedure: ENTEROSCOPY;  Surgeon: Ronald Lobo, MD;  Location: [facility];  Service: Endoscopy;  Laterality: N/A;  Push enteroscopy using either ultraslim colonoscope or push enteroscope  . ESOPHAGOGASTRODUODENOSCOPY N/A 09/13/2013   Procedure: ESOPHAGOGASTRODUODENOSCOPY (EGD);  Surgeon: Lear Ng, MD;  Location[facility] ENDOSCOPY;  Service: Endoscopy;  Laterality: N/A;  . ESOPHAGOGASTRODUODENOSCOPY N/A 07/14/2015   Procedure: ESOPHAGOGASTRODUODENOSCOPY (EGD);  Surgeon: Clarene Essex, MD;  Location: [facility] ENDOSCOPY;  Service: Endoscopy;  Laterality: N/A;  . gi bleed  07/2015   blood given  . HOT HEMOSTASIS N/A 12/27/2015   Procedure: HOT HEMOSTASIS (ARGON PLASMA COAGULATION/BICAP);  Surgeon: Clarene Essex, MD;  Location: [facility];  Service: Endoscopy;  Laterality: N/A;  . ILIAC ARTERY STENT Left 05/04/2013   L CIA-EIA -- Dr. Gwenlyn Found  . LEFT HEART CATH AND CORONARY ANGIOGRAPHY N/A 02/11/2019   Procedure: LEFT HEART CATH AND CORONARY ANGIOGRAPHY;  Surgeon: Leonie Man, MD;  Location: [facility] CV LAB;;-  Patent RCA stent with minimal ISR after pRCA 50%).  Small caliber RPAV with ~75%.  pLAD 30%, m-dLAD 40%, D2 40%.  Normal LVEDP and LVEF.  . LOWER EXTREMITY ANGIOGRAM Bilateral 05/04/2013   Procedure: LOWER EXTREMITY ANGIOGRAM;  Surgeon: Lorretta Harp, MD;  Location: [facility] CATH LAB;  Service: Cardiovascular;  Laterality: Bilateral;  . NM MYOVIEW LTD  09/07/2015   EF >  65%. Nonspecific ST changes but no ischemic changes. No ischemia or infarction. LOW RISK  . PATCH ANGIOPLASTY Left 07/28/2019   Procedure: PATCH ANGIOPLASTY USING Rueben Bash BIOLOGIC PATCH;  Surgeon: Waynetta Sandy, MD;  Location: [facility];  Service: Vascular;  Laterality: Left;  . TONSILLECTOMY  1960's  . TRANSTHORACIC ECHOCARDIOGRAM  09/21/2015   EF 60-65%. GR 1 DD. No regional wall motion analysis. No valve disease.  Marland Kitchen UPPER GI ENDOSCOPY  12/14/2015     OB History    Gravida  1   Para  1   Term      Preterm      AB      Living        SAB      TAB      Ectopic      Multiple      Live Births              Family History  Problem Relation  Age of Onset  . Diabetes Mother   . Heart disease Mother   . Heart attack Mother 20  . Breast cancer Mother 85  . Colon cancer Mother 65  . Heart attack Father 70  . Pneumonia Father   . Diabetes Sister   . Breast cancer Sister   . Breast cancer Sister 35    Social History   Tobacco Use  . Smoking status: Former Smoker    Types: Cigarettes    Quit date: 05/04/2013    Years since quitting: 6.2  . Smokeless tobacco: Never Used  Substance Use Topics  . Alcohol use: Yes    Comment: occasionally 3 times a year  . Drug use: No    Home Medications Prior to Admission medications   Medication Sig Start Date End Date Taking? Authorizing Provider  albuterol (VENTOLIN HFA) 108 (90 Base) MCG/ACT inhaler Inhale 2 puffs into the lungs every 6 (six) hours as needed for wheezing or shortness of breath.    [provider]  aspirin EC 81 MG tablet Take 81 mg by mouth daily.    [provider]  atorvastatin (LIPITOR) 80 MG tablet Take 1 tablet (80 mg total) by mouth daily. 05/27/19   Lendon Colonel, NP  clopidogrel (PLAVIX) 75 MG tablet Take 1 tablet (75 mg total) by mouth daily. 05/27/19   Lendon Colonel, NP  ezetimibe (ZETIA) 10 MG tablet Take 1 tablet (10 mg total) by mouth daily. 05/27/19    Lendon Colonel, NP  ferrous gluconate (FERGON) 324 MG tablet Take 1 tablet (324 mg total) by mouth 2 (two) times daily with a meal. 07/15/15   Rai, Ripudeep K, MD  fluticasone furoate-vilanterol (BREO ELLIPTA) 100-25 MCG/INH AEPB Inhale 1 puff into the lungs daily.    [provider]  gabapentin (NEURONTIN) 300 MG capsule Take 1 capsule (300 mg total) by mouth 2 (two) times daily. Patient not taking: Reported on 07/07/2019 06/20/16   Melvenia Beam, MD  isosorbide mononitrate (IMDUR) 60 MG 24 hr tablet Take 1.5 tablets (90 mg total) by mouth daily. 05/27/19   Lendon Colonel, NP  metoprolol succinate (TOPROL-XL) 100 MG 24 hr tablet Take 2 tablets (200 mg total) by mouth at bedtime. 06/01/19   Lendon Colonel, NP  nitroGLYCERIN (NITROSTAT) 0.4 MG SL tablet Place 1 tablet (0.4 mg total) under the tongue every 5 (five) minutes as needed for chest pain. 02/04/19   Leonie Man, MD  oxyCODONE (OXY IR/ROXICODONE) 5 MG immediate release tablet Take 1 tablet (5 mg total) by mouth every 4 (four) hours as needed for moderate pain. 07/29/19   Rhyne, Hulen Shouts, PA-C  pantoprazole (PROTONIX) 40 MG tablet Take 1 tablet (40 mg total) by mouth daily. 09/14/13   Ghimire, Henreitta Leber, MD    Allergies    Patient has no known allergies.  Review of Systems   Review of Systems  Constitutional: Negative for chills and fever.  HENT: Negative for congestion and rhinorrhea.   Eyes: Negative for redness and visual disturbance.  Respiratory: Positive for shortness of breath (on exertion). Negative for wheezing.   Cardiovascular: Negative for chest pain and palpitations.  Gastrointestinal: Positive for blood in stool (dark stool). Negative for abdominal pain, nausea and vomiting.  Genitourinary: Negative for dysuria and urgency.  Musculoskeletal: Negative for arthralgias and myalgias.  Skin: Negative for pallor and wound.  Neurological: Negative for dizziness and headaches.    Physical Exam Updated  Vital Signs BP (!) 132/44 (  BP Location: Right Arm)   Pulse 77   Temp 98.4 F (36.9 C)   Resp 19   Ht 4\' 11"  (1.499 m)   Wt 62.1 kg   SpO2 100%   BMI 27.67 kg/m   Physical Exam Vitals and nursing note reviewed.  Constitutional:      General: She is not in acute distress.    Appearance: She is well-developed. She is not diaphoretic.     Comments: Pale  HENT:     Head: Normocephalic and atraumatic.  Eyes:     Pupils: Pupils are equal, round, and reactive to light.  Cardiovascular:     Rate and Rhythm: Normal rate and regular rhythm.     Heart sounds: No murmur. No friction rub. No gallop.   Pulmonary:     Effort: Pulmonary effort is normal.     Breath sounds: No wheezing or rales.  Abdominal:     General: There is no distension.     Palpations: Abdomen is soft.     Tenderness: There is no abdominal tenderness.  Genitourinary:    Comments: Dark tarry stool in the vault.  Hemoccult positive. Musculoskeletal:        General: No tenderness.     Cervical back: Normal range of motion and neck supple.  Skin:    General: Skin is warm and dry.  Neurological:     Mental Status: She is alert and oriented to person, place, and time.  Psychiatric:        Behavior: Behavior normal.     ED Results / Procedures / Treatments   Labs (all labs ordered are listed, but only abnormal results are displayed) Labs Reviewed  CBC WITH DIFFERENTIAL/PLATELET - Abnormal; Notable for the following components:      Result Value   RBC 2.28 (*)    Hemoglobin 5.8 (*)    HCT 20.0 (*)    MCH 25.4 (*)    MCHC 29.0 (*)    RDW 22.3 (*)    All other components within normal limits  COMPREHENSIVE METABOLIC PANEL - Abnormal; Notable for the following components:   Chloride 112 (*)    Glucose, Bld 106 (*)    Creatinine, Ser 1.24 (*)    Calcium 8.3 (*)    Total Protein 6.4 (*)    Albumin 3.0 (*)    GFR calc non Af Amer 43 (*)    GFR calc Af Amer 50 (*)    All other components within normal limits   POC OCCULT BLOOD, ED - Abnormal; Notable for the following components:   Fecal Occult Bld POSITIVE (*)    All other components within normal limits  RESPIRATORY PANEL BY RT PCR (FLU A&B, COVID)  TYPE AND SCREEN  PREPARE RBC (CROSSMATCH)    EKG EKG Interpretation  Date/Time:  Thursday Aug 06 2019 16:31:30 EDT Ventricular Rate:  79 PR Interval:  136 QRS Duration: 78 QT Interval:  386 QTC Calculation: 442 R Axis:   86 Text Interpretation: Normal sinus rhythm with sinus arrhythmia Normal ECG No significant change since last tracing Confirmed by Deno Etienne [phone]) on 08/06/2019 6:15:45 PM   Radiology No results found.  Procedures Procedures (including critical care time)  Medications Ordered in ED Medications  pantoprazole (PROTONIX) injection 40 mg (has no administration in time range)  0.9 %  sodium chloride infusion (Manually program via Guardrails IV Fluids) (has no administration in time range)    ED Course  I have reviewed the triage vital signs  and the nursing notes.  Pertinent labs & imaging results that were available during my care of the patient were reviewed by me and considered in my medical decision making (see chart for details).    MDM Rules/Calculators/A&P                      74 yo F with a chief complaints of weakness fatigue and dark stools.  Patient is all her family doctor today and saw that her hemoglobin had dropped down to 6.  Repeat here is 5.8.  Will order transfusion.  Will discuss with GI.  I discussed the case with Dr. Annette Stable gastroenterology.  They will put her on the list and evaluate her tomorrow.  Will discuss with medicine.  CRITICAL CARE Performed by: Cecilio Asper   Total critical care time: 35 minutes  Critical care time was exclusive of separately billable procedures and treating other patients.  Critical care was necessary to treat or prevent imminent or life-threatening deterioration.  Critical care was time  spent personally by me on the following activities: development of treatment plan with patient and/or surrogate as well as nursing, discussions with consultants, evaluation of patient's response to treatment, examination of patient, obtaining history from patient or surrogate, ordering and performing treatments and interventions, ordering and review of laboratory studies, ordering and review of radiographic studies, pulse oximetry and re-evaluation of patient's condition.   The patients results and plan were reviewed and discussed.   Any x-rays performed were independently reviewed by myself.   Differential diagnosis were considered with the presenting HPI.  Medications  pantoprazole (PROTONIX) injection 40 mg (has no administration in time range)  0.9 %  sodium chloride infusion (Manually program via Guardrails IV Fluids) (has no administration in time range)    Vitals:   08/06/19 1601 08/06/19 1630 08/06/19 1918  BP: 130/65  (!) 132/44  Pulse: 91  77  Resp: 15  19  Temp: 98.4 F (36.9 C)    SpO2: 100%  100%  Weight:  62.1 kg   Height:  4\' 11"  (1.499 m)     Final diagnoses:  Upper GI bleed  Symptomatic anemia    Admission/ observation were discussed with the admitting physician, patient and/or family and they are comfortable with the plan.   Final Clinical Impression(s) / ED Diagnoses Final diagnoses:  Upper GI bleed  Symptomatic anemia    Rx / DC Orders ED Discharge Orders    None       Deno Etienne, DO 08/06/19 1936

## 2019-08-06 NOTE — ED Notes (Signed)
Per blood bank, pt + antibodies any blood will need to be ordered from outside source and will be delayed.

## 2019-08-06 NOTE — ED Notes (Signed)
Blood Consent signed by pt for blood transfusion

## 2019-08-06 NOTE — ED Triage Notes (Addendum)
Pt to ED with c/o lower GI bleed.  Pt s't she has had black stools x's 1 week.  Pt st's she was called by her MD and told to come to ED ref hgb of 6  Pt c/o shortness of breath with walking and chest pain

## 2019-08-06 NOTE — H&P (Signed)
History and Physical    Kayla Mcclure YBO:[id number] DOB: 11/29/1945 DOA: 08/06/2019  PCP: Kayla Macadam, MD   Patient coming from: Home   Chief Complaint: black stools, exertional dyspnea   HPI: Kayla Mcclure is a 74 y.o. female with medical history significant for coronary artery disease, chronic diastolic CHF, chronic kidney disease stage IIIa, and history of symptomatic carotid stenosis status post left CEA on 07/28/2019, now presenting to the emergency department with black stools, exertional dyspnea, and low hemoglobin on outpatient blood work.  Patient reports that she had been in her usual state of health and doing well after her CEA, but noticed black stool beginning 3 or 4 days ago and has been experiencing exertional dyspnea.  Patient reports that she develops chest discomfort with heavy exertion and has been experiencing that more frequently over the past couple days, but denies any angina at rest or with mild exertion.  She denies any abdominal pain, nausea, or vomiting.  She reports chronic dark stools that she attributes to iron but they have been essentially black and more greasy the last few days.  She denies any fevers, chills, or cough.  She denies any lower extremity swelling.  She continues to take a PPI daily at home and denies NSAID use.  She received 1 unit of RBC after her recent left CEA.  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  She was admitted for symptomatic anemia in February 2021 and had repeat small bowel enteroscopy with no angiodysplasia or other source of GI bleeding identified.  ED Course: Upon arrival to the ED, patient is found to be afebrile, saturating well on room air, and with stable blood pressure.  Chemistry panel with creatinine 1.24, consistent with her apparent baseline.  BUN is normal.  CBC is notable for hemoglobin of 5.8, down from 8.5 about a week ago.  Fecal occult blood testing is positive.  2 units of packed red blood  cells were ordered for transfusion, IV PPI was started, and gastroenterology was consulted by the ED physician.  Review of Systems:  All other systems reviewed and apart from HPI, are negative.  Past Medical History:  Diagnosis Date  . Asthma  10/31/2007  . CAD (coronary artery disease) 08/05/2007   a) NSTEMI 2009: PCI to the RCA Promus DES 2.5 mm x 23 mm (3.0 mm). b) Myoview June 2017: Nonspecific ST changes. EF greater than 65%. LOW RISK, normal study. No ischemia or infarction.  . Carotid artery disease ([place])    a. 50-69% RICA by outpt dopp 04/22/13, f/u due 04/2014.  Marland Kitchen Chronic back pain   . Chronic diastolic heart failure ([place])   . CKD (chronic kidney disease), stage II   . Diverticulosis   04/11/2010    Colonoscopy  . Dyslipidemia, goal LDL below 70   . Essential hypertension    a. Normal renal arteries by PV angio 05/2013.  Marland Kitchen GERD (gastroesophageal reflux disease)   . GI bleed 12/2015  . History of recent blood transfusion 12/2015   12-19-15, 12-20-15 and 12-21-15  . Insomnia   . Internal hemorrhoids  04/11/2010   By colonoscopy  . Iron deficiency anemia   . Myocardial infarction ([place]) 2009  . Peripheral arterial disease ([place])    a. s/p PTA and stenting of L CIA stenosis 05/2013.  Marland Kitchen Renal insufficiency   . Tobacco abuse    Quit in 05/2013    Past Surgical History:  Procedure Laterality Date  .  CHOLECYSTECTOMY  1975  . COLONOSCOPY  04/11/2010  . colonscopy  11/30/2015  . CORONARY ANGIOPLASTY WITH STENT PLACEMENT  08/05/2007   Promus DES 2.5 mm x 23 mm (3 mm)  to RCA; EF 60-70%  . ENDARTERECTOMY Left 07/28/2019   Procedure: ENDARTERECTOMY CAROTID;  Surgeon: Waynetta Sandy, MD;  Location: [place];  Service: Vascular;  Laterality: Left;  . ENTEROSCOPY N/A 12/27/2015   Procedure: ENTEROSCOPY;  Surgeon: Clarene Essex, MD;  Location: [facility]COPY;  Service: Endoscopy;  Laterality: N/A;  also needs slim egd scope  . ENTEROSCOPY N/A 05/15/2019   Procedure: ENTEROSCOPY;   Surgeon: Ronald Lobo, MD;  Location: [place];  Service: Endoscopy;  Laterality: N/A;  Push enteroscopy using either ultraslim colonoscope or push enteroscope  . ESOPHAGOGASTRODUODENOSCOPY N/A 09/13/2013   Procedure: ESOPHAGOGASTRODUODENOSCOPY (EGD);  Surgeon: Lear Ng, MD;  Location: [facility] ENDOSCOPY;  Service: Endoscopy;  Laterality: N/A;  . ESOPHAGOGASTRODUODENOSCOPY N/A 07/14/2015   Procedure: ESOPHAGOGASTRODUODENOSCOPY (EGD);  Surgeon: Clarene Essex, MD;  Location: [facility] ENDOSCOPY;  Service: Endoscopy;  Laterality: N/A;  . gi bleed  07/2015   blood given  . HOT HEMOSTASIS N/A 12/27/2015   Procedure: HOT HEMOSTASIS (ARGON PLASMA COAGULATION/BICAP);  Surgeon: Clarene Essex, MD;  Location: [facility];  Service: Endoscopy;  Laterality: N/A;  . ILIAC ARTERY STENT Left 05/04/2013   L CIA-EIA -- Dr. Gwenlyn Found  . LEFT HEART CATH AND CORONARY ANGIOGRAPHY N/A 02/11/2019   Procedure: LEFT HEART CATH AND CORONARY ANGIOGRAPHY;  Surgeon: Leonie Man, MD;  Location: [facility];;-  Patent RCA stent with minimal ISR after pRCA 50%).  Small caliber RPAV with ~75%.  pLAD 30%, m-dLAD 40%, D2 40%.  Normal LVEDP and LVEF.  . LOWER EXTREMITY ANGIOGRAM Bilateral 05/04/2013   Procedure: LOWER EXTREMITY ANGIOGRAM;  Surgeon: Lorretta Harp, MD;  Location: [facility] CATH LAB;  Service: Cardiovascular;  Laterality: Bilateral;  . NM MYOVIEW LTD  09/07/2015   EF > 65%. Nonspecific ST changes but no ischemic changes. No ischemia or infarction. LOW RISK  . PATCH ANGIOPLASTY Left 07/28/2019   Procedure: PATCH ANGIOPLASTY USING Rueben Bash BIOLOGIC PATCH;  Surgeon: Waynetta Sandy, MD;  Location: [place];  Service: Vascular;  Laterality: Left;  . TONSILLECTOMY  1960's  . TRANSTHORACIC ECHOCARDIOGRAM  09/21/2015   EF 60-65%. GR 1 DD. No regional wall motion analysis. No valve disease.  Marland Kitchen UPPER GI ENDOSCOPY  12/14/2015     reports that she quit smoking about 6 years ago. Her smoking use included cigarettes. She  has never used smokeless tobacco. She reports current alcohol use. She reports that she does not use drugs.  No Known Allergies  Family History  Problem Relation Age of Onset  . Diabetes Mother   . Heart disease Mother   . Heart attack Mother 70  . Breast cancer Mother 73  . Colon cancer Mother 110  . Heart attack Father 25  . Pneumonia Father   . Diabetes Sister   . Breast cancer Sister   . Breast cancer Sister 79     Prior to Admission medications   Medication Sig Start Date End Date Taking? Authorizing Provider  albuterol (VENTOLIN HFA) 108 (90 Base) MCG/ACT inhaler Inhale 2 puffs into the lungs every 6 (six) hours as needed for wheezing or shortness of breath.    [provider]  aspirin EC 81 MG tablet Take 81 mg by mouth daily.    [provider]  atorvastatin (LIPITOR) 80 MG tablet Take 1 tablet (80 mg  total) by mouth daily. 05/27/19   Lendon Colonel, NP  clopidogrel (PLAVIX) 75 MG tablet Take 1 tablet (75 mg total) by mouth daily. 05/27/19   Lendon Colonel, NP  ezetimibe (ZETIA) 10 MG tablet Take 1 tablet (10 mg total) by mouth daily. 05/27/19   Lendon Colonel, NP  ferrous gluconate (FERGON) 324 MG tablet Take 1 tablet (324 mg total) by mouth 2 (two) times daily with a meal. 07/15/15   Rai, Ripudeep K, MD  fluticasone furoate-vilanterol (BREO ELLIPTA) 100-25 MCG/INH AEPB Inhale 1 puff into the lungs daily.    [provider]  gabapentin (NEURONTIN) 300 MG capsule Take 1 capsule (300 mg total) by mouth 2 (two) times daily. Patient not taking: Reported on 07/07/2019 06/20/16   Melvenia Beam, MD  isosorbide mononitrate (IMDUR) 60 MG 24 hr tablet Take 1.5 tablets (90 mg total) by mouth daily. 05/27/19   Lendon Colonel, NP  metoprolol succinate (TOPROL-XL) 100 MG 24 hr tablet Take 2 tablets (200 mg total) by mouth at bedtime. 06/01/19   Lendon Colonel, NP  nitroGLYCERIN (NITROSTAT) 0.4 MG SL tablet Place 1 tablet (0.4 mg total) under the  tongue every 5 (five) minutes as needed for chest pain. 02/04/19   Leonie Man, MD  oxyCODONE (OXY IR/ROXICODONE) 5 MG immediate release tablet Take 1 tablet (5 mg total) by mouth every 4 (four) hours as needed for moderate pain. 07/29/19   Rhyne, Hulen Shouts, PA-C  pantoprazole (PROTONIX) 40 MG tablet Take 1 tablet (40 mg total) by mouth daily. 09/14/13   Jonetta Osgood, MD    Physical Exam: Vitals:   08/06/19 1601 08/06/19 1630 08/06/19 1918  BP: 130/65  (!) 132/44  Pulse: 91  77  Resp: 15  19  Temp: 98.4 F (36.9 C)    SpO2: 100%  100%  Weight:  62.1 kg   Height:  4\' 11"  (1.499 m)     Constitutional: NAD, calm  Eyes: PERTLA, lids and conjunctivae normal ENMT: Mucous membranes are moist. Posterior pharynx clear of any exudate or lesions.   Neck: normal, supple, no masses, no thyromegaly Respiratory:  no wheezing, no crackles. No accessory muscle use.  Cardiovascular: S1 & S2 heard, regular rate and rhythm. Trace lower leg edema bialterally.   Abdomen: No distension, no tenderness, soft. Bowel sounds active.  Musculoskeletal: no clubbing / cyanosis. No joint deformity upper and lower extremities.   Skin: no significant rashes, lesions, ulcers. Warm, dry, well-perfused. Neurologic: no facial asymmetry. Sensation intact. Moving all extremities.  Psychiatric: Alert and oriented to person, place, and situation. Very pleasant and cooperative.    Labs and Imaging on Admission: I have personally reviewed following labs and imaging studies  CBC: Recent Labs  Lab 08/06/19 1652  WBC 8.0  NEUTROABS 5.2  HGB 5.8*  HCT 20.0*  MCV 87.7  PLT 443   Basic Metabolic Panel: Recent Labs  Lab 08/06/19 1652  NA 141  K 5.1  CL 112*  CO2 23  GLUCOSE 106*  BUN 21  CREATININE 1.24*  CALCIUM 8.3*   GFR: Estimated Creatinine Clearance: 32.9 mL/min (A) (by C-G formula based on SCr of 1.24 mg/dL (H)). Liver Function Tests: Recent Labs  Lab 08/06/19 1652  AST 16  ALT 9    ALKPHOS 43  BILITOT 0.8  PROT 6.4*  ALBUMIN 3.0*   No results for input(s): LIPASE, AMYLASE in the last 168 hours. No results for input(s): AMMONIA in the last 168 hours. Coagulation  Profile: No results for input(s): INR, PROTIME in the last 168 hours. Cardiac Enzymes: No results for input(s): CKTOTAL, CKMB, CKMBINDEX, TROPONINI in the last 168 hours. BNP (last 3 results) No results for input(s): PROBNP in the last 8760 hours. HbA1C: No results for input(s): HGBA1C in the last 72 hours. CBG: No results for input(s): GLUCAP in the last 168 hours. Lipid Profile: No results for input(s): CHOL, HDL, LDLCALC, TRIG, CHOLHDL, LDLDIRECT in the last 72 hours. Thyroid Function Tests: No results for input(s): TSH, T4TOTAL, FREET4, T3FREE, THYROIDAB in the last 72 hours. Anemia Panel: No results for input(s): VITAMINB12, FOLATE, FERRITIN, TIBC, IRON, RETICCTPCT in the last 72 hours. Urine analysis:    Component Value Date/Time   COLORURINE STRAW (A) 07/15/2019 1156   APPEARANCEUR CLEAR 07/15/2019 1156   LABSPEC 1.006 07/15/2019 1156   PHURINE 7.0 07/15/2019 1156   GLUCOSEU NEGATIVE 07/15/2019 1156   HGBUR NEGATIVE 07/15/2019 1156   HGBUR negative 06/07/2009 0925   BILIRUBINUR NEGATIVE 07/15/2019 1156   KETONESUR NEGATIVE 07/15/2019 1156   PROTEINUR 30 (A) 07/15/2019 1156   UROBILINOGEN 0.2 06/07/2009 0925   NITRITE NEGATIVE 07/15/2019 1156   LEUKOCYTESUR SMALL (A) 07/15/2019 1156   Sepsis Labs: @LABRCNTIP (procalcitonin:4,lacticidven:4) )No results found for this or any previous visit (from the past 240 hour(s)).   Radiological Exams on Admission: No results found.  EKG: Independently reviewed. Sinus rhythm with sinus arrhythmia.   Assessment/Plan   1. Acute GI bleeding; symptomatic anemia  - Presents with melena and DOE and is found to have Hgb of 5.8, down from 8.5 on 07/29/19  - She is FOBT positive, hemodynamically stable  - She has melena but no pain, N/V, or elevation  in BUN  - AVMs in jejunum were treated with APC in 2017 and no AVMs seen on small bowel enteroscopy in Feb 2021  - GI is consulting and much appreciated  - 2 units RBC and IV PPI ordered by ED physician  - Continue NPO, IV PPI, transfuse, hold antiplatelets, check post-transfusion H&H   2. CAD  - She describes stable angina, no sxs at rest or with mild exertion  - Antiplatelets held on admission in light of acute GIB with symptomatic anemia   3. Asthma  - Stable on admission with no cough or wheeze  - Continue ICS/LABA and as-needed albuterol    4. CKD IIIa  - SCr is 1.24 in ED which appears to be baseline  - Renally-dose medications, monitor    5. Chronic diastolic CHF  - Appears compensated  - Gentle IVF hydration while NPO, follow daily wt and I/Os     DVT prophylaxis: SCDs Code Status: Full  Family Communication: Discussed with patient  Disposition Plan:  Patient is from: Home  Anticipated d/c is to: Home  Anticipated d/c date is: 08/08/19  Patient currently: Requiring blood transfusion for symptomatic anemia and GI consultation for acute GI bleeding  Consults called: GI consulted by ED physician  Admission status: Inpatient     Vianne Bulls, MD [facility] Pager: See [email or web address]  If 7AM-7PM, please contact the daytime attending [email or web address]  08/06/2019, 8:11 PM

## 2019-08-07 ENCOUNTER — Other Ambulatory Visit: Payer: Self-pay

## 2019-08-07 ENCOUNTER — Encounter (HOSPITAL_COMMUNITY): Payer: Self-pay | Admitting: Family Medicine

## 2019-08-07 DIAGNOSIS — K922 Gastrointestinal hemorrhage, unspecified: Secondary | ICD-10-CM

## 2019-08-07 HISTORY — DX: Gastrointestinal hemorrhage, unspecified: K92.2

## 2019-08-07 LAB — BASIC METABOLIC PANEL
Anion gap: 5 (ref 5–15)
BUN: 19 mg/dL (ref 8–23)
CO2: 21 mmol/L — ABNORMAL LOW (ref 22–32)
Calcium: 7.8 mg/dL — ABNORMAL LOW (ref 8.9–10.3)
Chloride: 112 mmol/L — ABNORMAL HIGH (ref 98–111)
Creatinine, Ser: 1.1 mg/dL — ABNORMAL HIGH (ref 0.44–1.00)
GFR calc Af Amer: 58 mL/min — ABNORMAL LOW (ref >60)
GFR calc non Af Amer: 50 mL/min — ABNORMAL LOW (ref >60)
Glucose, Bld: 90 mg/dL (ref 70–99)
Potassium: 4.8 mmol/L (ref 3.5–5.1)
Sodium: 138 mmol/L (ref 135–145)

## 2019-08-07 LAB — CBC
HCT: 30.1 % — ABNORMAL LOW (ref 36.0–46.0)
Hemoglobin: 9.4 g/dL — ABNORMAL LOW (ref 12.0–15.0)
MCH: 28.7 pg (ref 26.0–34.0)
MCHC: 31.2 g/dL (ref 30.0–36.0)
MCV: 92 fL (ref 80.0–100.0)
Platelets: 222 10*3/uL (ref 150–400)
RBC: 3.27 MIL/uL — ABNORMAL LOW (ref 3.87–5.11)
RDW: 18.4 % — ABNORMAL HIGH (ref 11.5–15.5)
WBC: 6.8 10*3/uL (ref 4.0–10.5)
nRBC: 0 % (ref 0.0–0.2)

## 2019-08-07 LAB — HEMOGLOBIN AND HEMATOCRIT, BLOOD
HCT: 30.6 % — ABNORMAL LOW (ref 36.0–46.0)
Hemoglobin: 9.7 g/dL — ABNORMAL LOW (ref 12.0–15.0)

## 2019-08-07 MED ORDER — SODIUM CHLORIDE 0.9 % IV SOLN: INTRAVENOUS | Status: DC

## 2019-08-07 NOTE — Consult Note (Signed)
Referring Provider: Dr. Deno Etienne Primary Care Physician:  [name], MD Primary Gastroenterologist:  Dr. Watt Climes [facility])  Reason for Consultation: Melena, anemia  HPI: Kayla Mcclure is a 74 y.o. 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  Patient stated she started having black, "tarry" stools on Tuesday 5/4.  Her last stool was Wednesday 5/5 and was also melenic and soft.  She denies any hematochezia.  Over the past week, she's noted dyspnea on exertion, exertional chest pain, and dizziness.  She denies syncope.  Patient denies nausea, vomiting, hematemesis, dysphagia, heartburn, abdominal pain, decreased appetite, unexplained weight loss.  Patient denies NSAID use but is on Plavix and aspirin at home.  Last dose of Plavix and Aspirin was Wednesday 5/5.  Patient denies family history of any colon cancer or gastrointestinal malignancies.  Hgb 5.8 on arrival (5/6), decreased from 8.5 on 4/28.  Last EGD 05/15/19 for anemia was unremarkable.  Last colonoscopy 11/2015 showed diverticulosis of sigmoid and descending colon; recommended repeat in 5 years (10/2020).  Past Medical History:  Diagnosis Date  . Asthma  10/31/2007  . CAD (coronary artery disease) 08/05/2007   a) NSTEMI 2009: PCI to the RCA Promus DES 2.5 mm x 23 mm (3.0 mm). b) Myoview June 2017: Nonspecific ST changes. EF greater than 65%. LOW RISK, normal study. No ischemia or infarction.  . Carotid artery disease ([place])    a. 50-69% RICA by outpt dopp 04/22/13, f/u due 04/2014.  Marland Kitchen Chronic back pain   . Chronic diastolic heart failure ([place])   . CKD (chronic kidney disease), stage II   . Diverticulosis   04/11/2010    Colonoscopy  . Dyslipidemia, goal LDL below 70   . Essential hypertension    a. Normal renal arteries by PV angio 05/2013.  Marland Kitchen GERD (gastroesophageal reflux  disease)   . GI bleed 12/2015  . History of recent blood transfusion 12/2015   12-19-15, 12-20-15 and 12-21-15  . Insomnia   . Internal hemorrhoids  04/11/2010   By colonoscopy  . Iron deficiency anemia   . Myocardial infarction ([place]) 2009  . Peripheral arterial disease ([place])    a. s/p PTA and stenting of L CIA stenosis 05/2013.  Marland Kitchen Renal insufficiency   . Tobacco abuse    Quit in 05/2013    Past Surgical History:  Procedure Laterality Date  . CHOLECYSTECTOMY  1975  . COLONOSCOPY  04/11/2010  . colonscopy  11/30/2015  . CORONARY ANGIOPLASTY WITH STENT PLACEMENT  08/05/2007   Promus DES 2.5 mm x 23 mm (3 mm)  to RCA; EF 60-70%  . ENDARTERECTOMY Left 07/28/2019   Procedure: ENDARTERECTOMY CAROTID;  Surgeon: Waynetta Sandy, MD;  Location: [place];  Service: Vascular;  Laterality: Left;  . ENTEROSCOPY N/A 12/27/2015   Procedure: ENTEROSCOPY;  Surgeon: Clarene Essex, MD;  Location: [facility] ENDOSCOPY;  Service: Endoscopy;  Laterality: N/A;  also needs slim egd scope  . ENTEROSCOPY N/A 05/15/2019   Procedure: ENTEROSCOPY;  Surgeon: Ronald Lobo, MD;  Location: [place];  Service: Endoscopy;  Laterality: N/A;  Push enteroscopy using either ultraslim colonoscope or push enteroscope  . ESOPHAGOGASTRODUODENOSCOPY N/A 09/13/2013   Procedure: ESOPHAGOGASTRODUODENOSCOPY (EGD);  Surgeon: Lear Ng, MD;  Location: [facility] ENDOSCOPY;  Service: Endoscopy;  Laterality: N/A;  . ESOPHAGOGASTRODUODENOSCOPY N/A 07/14/2015   Procedure: ESOPHAGOGASTRODUODENOSCOPY (EGD);  Surgeon: Clarene Essex, MD;  Location: [facility]  ENDOSCOPY;  Service: Endoscopy;  Laterality: N/A;  . gi bleed  07/2015   blood given  . HOT HEMOSTASIS N/A 12/27/2015   Procedure: HOT HEMOSTASIS (ARGON PLASMA COAGULATION/BICAP);  Surgeon: Clarene Essex, MD;  Location: [facility];  Service: Endoscopy;  Laterality: N/A;  . ILIAC ARTERY STENT Left 05/04/2013   L CIA-EIA -- Dr. Gwenlyn Found  . LEFT HEART CATH AND CORONARY ANGIOGRAPHY N/A 02/11/2019    Procedure: LEFT HEART CATH AND CORONARY ANGIOGRAPHY;  Surgeon: Leonie Man, MD;  Location: [place] CV LAB;;-  Patent RCA stent with minimal ISR after pRCA 50%).  Small caliber RPAV with ~75%.  pLAD 30%, m-dLAD 40%, D2 40%.  Normal LVEDP and LVEF.  . LOWER EXTREMITY ANGIOGRAM Bilateral 05/04/2013   Procedure: LOWER EXTREMITY ANGIOGRAM;  Surgeon: Lorretta Harp, MD;  Location: [facility] CATH LAB;  Service: Cardiovascular;  Laterality: Bilateral;  . NM MYOVIEW LTD  09/07/2015   EF > 65%. Nonspecific ST changes but no ischemic changes. No ischemia or infarction. LOW RISK  . PATCH ANGIOPLASTY Left 07/28/2019   Procedure: PATCH ANGIOPLASTY USING Rueben Bash BIOLOGIC PATCH;  Surgeon: Waynetta Sandy, MD;  Location: [place];  Service: Vascular;  Laterality: Left;  . TONSILLECTOMY  1960's  . TRANSTHORACIC ECHOCARDIOGRAM  09/21/2015   EF 60-65%. GR 1 DD. No regional wall motion analysis. No valve disease.  Marland Kitchen UPPER GI ENDOSCOPY  12/14/2015    Prior to Admission medications   Medication Sig Start Date End Date Taking? Authorizing Provider  albuterol (VENTOLIN HFA) 108 (90 Base) MCG/ACT inhaler Inhale 2 puffs into the lungs every 6 (six) hours as needed for wheezing or shortness of breath.   Yes [provider]  aspirin EC 81 MG tablet Take 81 mg by mouth daily.   Yes [provider]  atorvastatin (LIPITOR) 80 MG tablet Take 1 tablet (80 mg total) by mouth daily. 05/27/19  Yes Lendon Colonel, NP  clopidogrel (PLAVIX) 75 MG tablet Take 1 tablet (75 mg total) by mouth daily. 05/27/19  Yes Lendon Colonel, NP  ezetimibe (ZETIA) 10 MG tablet Take 1 tablet (10 mg total) by mouth daily. 05/27/19  Yes Lendon Colonel, NP  ferrous gluconate (FERGON) 324 MG tablet Take 1 tablet (324 mg total) by mouth 2 (two) times daily with a meal. 07/15/15  Yes Rai, Ripudeep K, MD  fluticasone furoate-vilanterol (BREO ELLIPTA) 100-25 MCG/INH AEPB Inhale 1 puff into the lungs daily.   Yes [provider]  isosorbide mononitrate (IMDUR) 60 MG 24 hr tablet Take 1.5 tablets (90 mg total) by mouth daily. 05/27/19  Yes Lendon Colonel, NP  metoprolol succinate (TOPROL-XL) 100 MG 24 hr tablet Take 2 tablets (200 mg total) by mouth at bedtime. 06/01/19  Yes Lendon Colonel, NP  nitroGLYCERIN (NITROSTAT) 0.4 MG SL tablet Place 1 tablet (0.4 mg total) under the tongue every 5 (five) minutes as needed for chest pain. 02/04/19  Yes Leonie Man, MD  oxyCODONE (OXY IR/ROXICODONE) 5 MG immediate release tablet Take 1 tablet (5 mg total) by mouth every 4 (four) hours as needed for moderate pain. 07/29/19  Yes Rhyne, Samantha J, PA-C  pantoprazole (PROTONIX) 40 MG tablet Take 1 tablet (40 mg total) by mouth daily. 09/14/13  Yes Ghimire, Henreitta Leber, MD  gabapentin (NEURONTIN) 300 MG capsule Take 1 capsule (300 mg total) by mouth 2 (two) times daily. Patient not taking: Reported on 07/07/2019 06/20/16   Melvenia Beam, MD    Scheduled Meds: .  sodium chloride   Intravenous Once  . fluticasone furoate-vilanterol  1 puff Inhalation Daily  . pantoprazole (PROTONIX) IV  40 mg Intravenous Q12H  . sodium chloride flush  3 mL Intravenous Q12H   Continuous Infusions: PRN Meds:.acetaminophen **OR** acetaminophen, albuterol, ondansetron **OR** ondansetron (ZOFRAN) IV  Allergies as of 08/06/2019  . (No Known Allergies)    Family History  Problem Relation Age of Onset  . Diabetes Mother   . Heart disease Mother   . Heart attack Mother 49  . Breast cancer Mother 60  . Colon cancer Mother 64  . Heart attack Father 33  . Pneumonia Father   . Diabetes Sister   . Breast cancer Sister   . Breast cancer Sister 47    Social History   Socioeconomic History  . Marital status: Married    Spouse name: Not on file  . Number of children: 1  . Years of education: 39  . Highest education level: High school graduate  Occupational History    Employer: RETIRED  Tobacco Use  . Smoking status: Former  Smoker    Types: Cigarettes    Quit date: 05/04/2013    Years since quitting: 6.2  . Smokeless tobacco: Never Used  Substance and Sexual Activity  . Alcohol use: Yes    Comment: occasionally 3 times a year  . Drug use: No  . Sexual activity: Never    Partners: Male    Birth control/protection: None  Other Topics Concern  . Not on file  Social History Narrative   Married. Has one child. Grandmother for a great-grandmother and 1.   No real exercise.   Occasional alcohol consumption.   Quit smoking inf Feb 2015 -- after L Iliac Stent   Right-handed   Caffeine: 1 cup every morning   Right-handed.   Lives at home with husband.   Social Determinants of Health   Financial Resource Strain:   . Difficulty of Paying Living Expenses:   Food Insecurity:   . Worried About Charity fundraiser in the Last Year:   . Arboriculturist in the Last Year:   Transportation Needs:   . Film/video editor (Medical):   Marland Kitchen Lack of Transportation (Non-Medical):   Physical Activity:   . Days of Exercise per Week:   . Minutes of Exercise per Session:   Stress:   . Feeling of Stress :   Social Connections:   . Frequency of Communication with Friends and Family:   . Frequency of Social Gatherings with Friends and Family:   . Attends Religious Services:   . Active Member of Clubs or Organizations:   . Attends Archivist Meetings:   Marland Kitchen Marital Status:   Intimate Partner Violence:   . Fear of Current or Ex-Partner:   . Emotionally Abused:   Marland Kitchen Physically Abused:   . Sexually Abused:     Review of Systems: Review of Systems  Constitutional: Negative for chills and fever.  HENT: Negative for hearing loss and tinnitus.   Eyes: Negative for blurred vision and pain.  Respiratory: Positive for shortness of breath (on exertion). Negative for cough.   Cardiovascular: Positive for chest pain (on exertion). Negative for palpitations.  Gastrointestinal: Positive for diarrhea and melena. Negative  for abdominal pain, blood in stool, constipation, heartburn, nausea and vomiting.  Genitourinary: Negative for dysuria and hematuria.  Musculoskeletal: Positive for back pain (lower back). Negative for falls.  Skin: Negative for itching and rash.  Neurological: Positive  for dizziness. Negative for loss of consciousness.  Endo/Heme/Allergies: Negative for polydipsia. Does not bruise/bleed easily.  Psychiatric/Behavioral: Negative for substance abuse. The patient is not nervous/anxious.     Physical Exam: Vital signs: Vitals:   08/07/19 0630 08/07/19 0715  BP: (!) 128/56 (!) 134/54  Pulse: 72 74  Resp: (!) 23 19  Temp:    SpO2: 91% 97%      Physical Exam  Constitutional: She is oriented to person, place, and time. She appears well-developed and well-nourished. No distress.  HENT:  Head: Normocephalic and atraumatic.  Eyes: EOM are normal. No scleral icterus.  Mild conjunctival pallor  Neck:  Scar on left neck from carotid endarterectomy  Cardiovascular: Normal rate, regular rhythm and normal heart sounds.  Pulmonary/Chest: Effort normal and breath sounds normal. No respiratory distress.  Abdominal: Soft. Bowel sounds are normal. She exhibits no distension and no mass. There is no abdominal tenderness. There is no rebound and no guarding.  Musculoskeletal:        General: No deformity or edema.     Cervical back: Normal range of motion and neck supple.  Neurological: She is alert and oriented to person, place, and time.  Skin: Skin is warm and dry.  Psychiatric: She has a normal mood and affect. Her behavior is normal.     GI:  Lab Results: Recent Labs    08/06/19 1652 08/07/19 0740  WBC 8.0 6.8  HGB 5.8* 9.4*  HCT 20.0* 30.1*  PLT 241 222   BMET Recent Labs    08/06/19 1652  NA 141  K 5.1  CL 112*  CO2 23  GLUCOSE 106*  BUN 21  CREATININE 1.24*  CALCIUM 8.3*   LFT Recent Labs    08/06/19 1652  PROT 6.4*  ALBUMIN 3.0*  AST 16  ALT 9  ALKPHOS 43   BILITOT 0.8   PT/INR No results for input(s): LABPROT, INR in the last 72 hours.   Studies/Results: No results found.  Impression: Melena and symptomatic anemia in the setting of Plavix and Aspirin use. -Last dose of Plavix and Aspirin was Wednesday 5/5 -Hgb 5.8 on arrival (5/6), decreased from 8.5 on 4/28. -Stable vital signs (BP 130s/50s, HR 70s-80s) -BUN 19/ Cr 1.10 (similar to baseline)  CAD s/p carotid endarterectomy 07/28/19  CKD stage III, at baseline  Chronic diastolic CHF, at baseline  Plan: Enteroscopy tomorrow 5/8.  Clears OK today with NPO at midnight.  I thoroughly discussed the procedure, benefits, and risks (including but not limited to bleeding, infection, perforation).  Patient was given the opportunity ask questions.  Patient gave verbal consent to proceed with the procedure.  Continue Protonix 40mg  BID  Continue to monitor H&H with transfusion as needed to maintain Hgb >8  [facility] will follow.   LOS: 1 day   Salley Slaughter  PA-C 08/07/2019, 8:24 AM  Contact #  [phone]

## 2019-08-07 NOTE — Progress Notes (Signed)
PROGRESS NOTE    Kayla Mcclure  LFY:[id number] DOB: [birth year] DOA: 08/06/2019 PCP: [name], MD   Chief Complaint  Patient presents with  . GI Bleed    Brief Narrative: HPI per Dr. Myna Hidalgo on 08/06/2019 Kayla Mcclure is a 74 y.o. female with medical history significant for coronary artery disease, chronic diastolic CHF, chronic kidney disease stage IIIa, and history of symptomatic carotid stenosis status post left CEA on 07/28/2019, now presenting to the emergency department with black stools, exertional dyspnea, and low hemoglobin on outpatient blood work.  Patient reports that she had been in her usual state of health and doing well after her CEA, but noticed black stool beginning 3 or 4 days ago and has been experiencing exertional dyspnea.  Patient reports that she develops chest discomfort with heavy exertion and has been experiencing that more frequently over the past couple days, but denies any angina at rest or with mild exertion.  She denies any abdominal pain, nausea, or vomiting.  She reports chronic dark stools that she attributes to iron but they have been essentially black and more greasy the last few days.  She denies any fevers, chills, or cough.  She denies any lower extremity swelling.  She continues to take a PPI daily at home and denies NSAID use.  She received 1 unit of RBC after her recent left CEA.  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  She was admitted for symptomatic anemia in February 2021 and had repeat small bowel enteroscopy with no angiodysplasia or other source of GI bleeding identified.  ED Course: Upon arrival to the ED, patient is found to be afebrile, saturating well on room air, and with stable blood pressure.  Chemistry panel with creatinine 1.24, consistent with her apparent baseline.  BUN is normal.  CBC is notable for hemoglobin of 5.8, down from 8.5 about a week ago.  Fecal occult blood testing is positive.  2 units of  packed red blood cells were ordered for transfusion, IV PPI was started, and gastroenterology was consulted by the ED physician.  5/7 -patient had 2 units of blood transfusion.  Has not had any bowel movement since coming to the hospital.  Denies any nausea vomiting.  However she has been having black stools since 3 days prior to admission.  She takes iron at home and her stools have always been black however she noticed it was a different black for the last 3 days.  Assessment & Plan:   Principal Problem:   Acute GI bleeding Active Problems:   Essential hypertension, benign   CAD S/P percutaneous coronary angioplasty -- Promus DES 2.5 mm x 20 mm postdilated to 3 mm   Asthma   Symptomatic anemia   Chronic diastolic CHF (congestive heart failure) (HCC)   CKD (chronic kidney disease), stage III   #1 acute GI bleeding patient presenting with melena and dyspnea on exertion.  Hemoglobin was 5.80 down from 8.5 on 07/29/2019.  She was FOBT positive in the ER.  She received 2 units of blood transfusion.  Hemoglobin after transfusion is 9.4.  She is on aspirin and Plavix at home.  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 Seen by GI planning for enteroscopy in a.m. Continue PPI, follow-up H&H.  Clear liquid diets and n.p.o. after midnight.  #2 stage III CKD at baseline creatinine 1.10.  #3 diastolic CHF stable and euvolemic  #4 status  post left carotid endarterectomy 07/28/2019  #5 CAD aspirin and Plavix on hold due to GI bleed  #6 history of asthma continue home meds  DVT prophylaxis: SCD  code Status: Full code Family Communication: Discussed with patient disposition: Status is inpatient patient admitted with acute GI bleed plan for enteroscopy in a.m.  Dispo: The patient is from: Home              Anticipated d/c is to: Home              Anticipated d/c date is: More than 2 days              Patient currently is not medically stable to d/c.     Consultants:  GI Procedures: None Antimicrobials: None Subjective: Patient is resting in bed she denies any further melena denies nausea vomiting  Objective: Vitals:   08/07/19 1030 08/07/19 1115 08/07/19 1200 08/07/19 1245  BP: (!) 160/54 (!) 144/53 (!) 169/65 (!) 162/72  Pulse: 73 72 68 82  Resp: (!) 23 17 (!) 22 16  Temp:      TempSrc:      SpO2: 97% 98% 95% 98%  Weight:      Height:        Intake/Output Summary (Last 24 hours) at 08/07/2019 1303 Last data filed at 08/07/2019 0713 Gross per 24 hour  Intake 1445 ml  Output --  Net 1445 ml   Filed Weights   08/06/19 1630  Weight: 62.1 kg    Examination:  General exam: Appears calm and comfortable  Respiratory system: Clear to auscultation. Respiratory effort normal. Cardiovascular system: S1 & S2 heard, RRR. No JVD, murmurs, rubs, gallops or clicks. No pedal edema. Gastrointestinal system: Abdomen is nondistended, soft and nontender. No organomegaly or masses felt. Normal bowel sounds heard. Central nervous system: Alert and oriented. No focal neurological deficits. Extremities: Symmetric 5 x 5 power. Skin: No rashes, lesions or ulcers Psychiatry: Judgement and insight appear normal. Mood & affect appropriate.     Data Reviewed: I have personally reviewed following labs and imaging studies  CBC: Recent Labs  Lab 08/06/19 1652 08/07/19 0740  WBC 8.0 6.8  NEUTROABS 5.2  --   HGB 5.8* 9.4*  HCT 20.0* 30.1*  MCV 87.7 92.0  PLT 241 676    Basic Metabolic Panel: Recent Labs  Lab 08/06/19 1652 08/07/19 0740  NA 141 138  K 5.1 4.8  CL 112* 112*  CO2 23 21*  GLUCOSE 106* 90  BUN 21 19  CREATININE 1.24* 1.10*  CALCIUM 8.3* 7.8*    GFR: Estimated Creatinine Clearance: 37.1 mL/min (A) (by C-G formula based on SCr of 1.1 mg/dL (H)).  Liver Function Tests: Recent Labs  Lab 08/06/19 1652  AST 16  ALT 9  ALKPHOS 43  BILITOT 0.8  PROT 6.4*  ALBUMIN 3.0*    CBG: No results for input(s): GLUCAP  in the last 168 hours.   Recent Results (from the past 240 hour(s))  Respiratory Panel by RT PCR (Flu A&B, Covid) - Nasopharyngeal Swab     Status: None   Collection Time: 08/06/19  7:55 PM   Specimen: Nasopharyngeal Swab  Result Value Ref Range Status   SARS Coronavirus 2 by RT PCR NEGATIVE NEGATIVE Final    Comment: (NOTE) SARS-CoV-2 target nucleic acids are NOT DETECTED. The SARS-CoV-2 RNA is generally detectable in upper respiratoy specimens during the acute phase of infection. The lowest concentration of SARS-CoV-2 viral copies this assay can detect is  131 copies/mL. A negative result does not preclude SARS-Cov-2 infection and should not be used as the sole basis for treatment or other patient management decisions. A negative result may occur with  improper specimen collection/handling, submission of specimen other than nasopharyngeal swab, presence of viral mutation(s) within the areas targeted by this assay, and inadequate number of viral copies (<131 copies/mL). A negative result must be combined with clinical observations, patient history, and epidemiological information. The expected result is Negative. Fact Sheet for Patients:  PinkCheek.be Fact Sheet for Healthcare Providers:  GravelBags.it This test is not yet ap proved or cleared by the [place] FDA and  has been authorized for detection and/or diagnosis of SARS-CoV-2 by FDA under an Emergency Use Authorization (EUA). This EUA will remain  in effect (meaning this test can be used) for the duration of the COVID-19 declaration under Section 564(b)(1) of the Act, 21 U.S.C. section 360bbb-3(b)(1), unless the authorization is terminated or revoked sooner.    Influenza A by PCR NEGATIVE NEGATIVE Final   Influenza B by PCR NEGATIVE NEGATIVE Final    Comment: (NOTE) The Xpert Xpress SARS-CoV-2/FLU/RSV assay is intended as an aid in  the diagnosis of influenza  from Nasopharyngeal swab specimens and  should not be used as a sole basis for treatment. Nasal washings and  aspirates are unacceptable for Xpert Xpress SARS-CoV-2/FLU/RSV  testing. Fact Sheet for Patients: PinkCheek.be Fact Sheet for Healthcare Providers: GravelBags.it This test is not yet approved or cleared by the [place] FDA and  has been authorized for detection and/or diagnosis of SARS-CoV-2 by  FDA under an Emergency Use Authorization (EUA). This EUA will remain  in effect (meaning this test can be used) for the duration of the  Covid-19 declaration under Section 564(b)(1) of the Act, 21  U.S.C. section 360bbb-3(b)(1), unless the authorization is  terminated or revoked. Performed at [facility], [place] [address], [place], [place] [address]          Radiology Studies: No results found.      Scheduled Meds: . sodium chloride   Intravenous Once  . fluticasone furoate-vilanterol  1 puff Inhalation Daily  . pantoprazole (PROTONIX) IV  40 mg Intravenous Q12H  . sodium chloride flush  3 mL Intravenous Q12H   Continuous Infusions: . sodium chloride 20 mL/hr at 08/07/19 1236     LOS: 1 day     Georgette Shell, MD [facility]   To contact the attending provider between 7A-7P or the covering provider during after hours 7P-7A, please log into the web site [email or web address] and access using universal [place] password for that web site. If you do not have the password, please call the hospital operator.  08/07/2019, 1:03 PM

## 2019-08-07 NOTE — ED Notes (Signed)
Patient is getting blood at this time

## 2019-08-07 NOTE — ED Notes (Signed)
RN attempted report x1.  

## 2019-08-07 NOTE — H&P (View-Only) (Signed)
Referring Provider: Dr. Deno Etienne Primary Care Physician:  [name], MD Primary Gastroenterologist:  Dr. Watt Climes [facility])  Reason for Consultation: Melena, anemia  HPI: Kayla Mcclure is a 74 y.o. 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  Patient stated she started having black, "tarry" stools on Tuesday 5/4.  Her last stool was Wednesday 5/5 and was also melenic and soft.  She denies any hematochezia.  Over the past week, she's noted dyspnea on exertion, exertional chest pain, and dizziness.  She denies syncope.  Patient denies nausea, vomiting, hematemesis, dysphagia, heartburn, abdominal pain, decreased appetite, unexplained weight loss.  Patient denies NSAID use but is on Plavix and aspirin at home.  Last dose of Plavix and Aspirin was Wednesday 5/5.  Patient denies family history of any colon cancer or gastrointestinal malignancies.  Hgb 5.8 on arrival (5/6), decreased from 8.5 on 4/28.  Last EGD 05/15/19 for anemia was unremarkable.  Last colonoscopy 11/2015 showed diverticulosis of sigmoid and descending colon; recommended repeat in 5 years (10/2020).  Past Medical History:  Diagnosis Date  . Asthma  10/31/2007  . CAD (coronary artery disease) 08/05/2007   a) NSTEMI 2009: PCI to the RCA Promus DES 2.5 mm x 23 mm (3.0 mm). b) Myoview June 2017: Nonspecific ST changes. EF greater than 65%. LOW RISK, normal study. No ischemia or infarction.  . Carotid artery disease ([place])    a. 50-69% RICA by outpt dopp 04/22/13, f/u due 04/2014.  Marland Kitchen Chronic back pain   . Chronic diastolic heart failure ([place])   . CKD (chronic kidney disease), stage II   . Diverticulosis   04/11/2010    Colonoscopy  . Dyslipidemia, goal LDL below 70   . Essential hypertension    a. Normal renal arteries by PV angio 05/2013.  Marland Kitchen GERD (gastroesophageal reflux  disease)   . GI bleed 12/2015  . History of recent blood transfusion 12/2015   12-19-15, 12-20-15 and 12-21-15  . Insomnia   . Internal hemorrhoids  04/11/2010   By colonoscopy  . Iron deficiency anemia   . Myocardial infarction ([place]) 2009  . Peripheral arterial disease ([place])    a. s/p PTA and stenting of L CIA stenosis 05/2013.  Marland Kitchen Renal insufficiency   . Tobacco abuse    Quit in 05/2013    Past Surgical History:  Procedure Laterality Date  . CHOLECYSTECTOMY  1975  . COLONOSCOPY  04/11/2010  . colonscopy  11/30/2015  . CORONARY ANGIOPLASTY WITH STENT PLACEMENT  08/05/2007   Promus DES 2.5 mm x 23 mm (3 mm)  to RCA; EF 60-70%  . ENDARTERECTOMY Left 07/28/2019   Procedure: ENDARTERECTOMY CAROTID;  Surgeon: Waynetta Sandy, MD;  Location: [place];  Service: Vascular;  Laterality: Left;  . ENTEROSCOPY N/A 12/27/2015   Procedure: ENTEROSCOPY;  Surgeon: Clarene Essex, MD;  Location: [facility] ENDOSCOPY;  Service: Endoscopy;  Laterality: N/A;  also needs slim egd scope  . ENTEROSCOPY N/A 05/15/2019   Procedure: ENTEROSCOPY;  Surgeon: Ronald Lobo, MD;  Location: [place];  Service: Endoscopy;  Laterality: N/A;  Push enteroscopy using either ultraslim colonoscope or push enteroscope  . ESOPHAGOGASTRODUODENOSCOPY N/A 09/13/2013   Procedure: ESOPHAGOGASTRODUODENOSCOPY (EGD);  Surgeon: Lear Ng, MD;  Location: [facility] ENDOSCOPY;  Service: Endoscopy;  Laterality: N/A;  . ESOPHAGOGASTRODUODENOSCOPY N/A 07/14/2015   Procedure: ESOPHAGOGASTRODUODENOSCOPY (EGD);  Surgeon: Clarene Essex, MD;  Location: [facility]  ENDOSCOPY;  Service: Endoscopy;  Laterality: N/A;  . gi bleed  07/2015   blood given  . HOT HEMOSTASIS N/A 12/27/2015   Procedure: HOT HEMOSTASIS (ARGON PLASMA COAGULATION/BICAP);  Surgeon: Clarene Essex, MD;  Location: [facility];  Service: Endoscopy;  Laterality: N/A;  . ILIAC ARTERY STENT Left 05/04/2013   L CIA-EIA -- Dr. Gwenlyn Found  . LEFT HEART CATH AND CORONARY ANGIOGRAPHY N/A 02/11/2019    Procedure: LEFT HEART CATH AND CORONARY ANGIOGRAPHY;  Surgeon: Leonie Man, MD;  Location: [facility] LAB;;-  Patent RCA stent with minimal ISR after pRCA 50%).  Small caliber RPAV with ~75%.  pLAD 30%, m-dLAD 40%, D2 40%.  Normal LVEDP and LVEF.  . LOWER EXTREMITY ANGIOGRAM Bilateral 05/04/2013   Procedure: LOWER EXTREMITY ANGIOGRAM;  Surgeon: Lorretta Harp, MD;  Location: [facility] CATH LAB;  Service: Cardiovascular;  Laterality: Bilateral;  . NM MYOVIEW LTD  09/07/2015   EF > 65%. Nonspecific ST changes but no ischemic changes. No ischemia or infarction. LOW RISK  . PATCH ANGIOPLASTY Left 07/28/2019   Procedure: PATCH ANGIOPLASTY USING Rueben Bash BIOLOGIC PATCH;  Surgeon: Waynetta Sandy, MD;  Location: [place];  Service: Vascular;  Laterality: Left;  . TONSILLECTOMY  1960's  . TRANSTHORACIC ECHOCARDIOGRAM  09/21/2015   EF 60-65%. GR 1 DD. No regional wall motion analysis. No valve disease.  Marland Kitchen UPPER GI ENDOSCOPY  12/14/2015    Prior to Admission medications   Medication Sig Start Date End Date Taking? Authorizing Provider  albuterol (VENTOLIN HFA) 108 (90 Base) MCG/ACT inhaler Inhale 2 puffs into the lungs every 6 (six) hours as needed for wheezing or shortness of breath.   Yes [provider]  aspirin EC 81 MG tablet Take 81 mg by mouth daily.   Yes [provider]  atorvastatin (LIPITOR) 80 MG tablet Take 1 tablet (80 mg total) by mouth daily. 05/27/19  Yes Lendon Colonel, NP  clopidogrel (PLAVIX) 75 MG tablet Take 1 tablet (75 mg total) by mouth daily. 05/27/19  Yes Lendon Colonel, NP  ezetimibe (ZETIA) 10 MG tablet Take 1 tablet (10 mg total) by mouth daily. 05/27/19  Yes Lendon Colonel, NP  ferrous gluconate (FERGON) 324 MG tablet Take 1 tablet (324 mg total) by mouth 2 (two) times daily with a meal. 07/15/15  Yes Rai, Ripudeep K, MD  fluticasone furoate-vilanterol (BREO ELLIPTA) 100-25 MCG/INH AEPB Inhale 1 puff into the lungs daily.   Yes [provider]  isosorbide mononitrate (IMDUR) 60 MG 24 hr tablet Take 1.5 tablets (90 mg total) by mouth daily. 05/27/19  Yes Lendon Colonel, NP  metoprolol succinate (TOPROL-XL) 100 MG 24 hr tablet Take 2 tablets (200 mg total) by mouth at bedtime. 06/01/19  Yes Lendon Colonel, NP  nitroGLYCERIN (NITROSTAT) 0.4 MG SL tablet Place 1 tablet (0.4 mg total) under the tongue every 5 (five) minutes as needed for chest pain. 02/04/19  Yes Leonie Man, MD  oxyCODONE (OXY IR/ROXICODONE) 5 MG immediate release tablet Take 1 tablet (5 mg total) by mouth every 4 (four) hours as needed for moderate pain. 07/29/19  Yes Rhyne, Samantha J, PA-C  pantoprazole (PROTONIX) 40 MG tablet Take 1 tablet (40 mg total) by mouth daily. 09/14/13  Yes Ghimire, Henreitta Leber, MD  gabapentin (NEURONTIN) 300 MG capsule Take 1 capsule (300 mg total) by mouth 2 (two) times daily. Patient not taking: Reported on 07/07/2019 06/20/16   Melvenia Beam, MD    Scheduled Meds: .  sodium chloride   Intravenous Once  . fluticasone furoate-vilanterol  1 puff Inhalation Daily  . pantoprazole (PROTONIX) IV  40 mg Intravenous Q12H  . sodium chloride flush  3 mL Intravenous Q12H   Continuous Infusions: PRN Meds:.acetaminophen **OR** acetaminophen, albuterol, ondansetron **OR** ondansetron (ZOFRAN) IV  Allergies as of 08/06/2019  . (No Known Allergies)    Family History  Problem Relation Age of Onset  . Diabetes Mother   . Heart disease Mother   . Heart attack Mother 81  . Breast cancer Mother 28  . Colon cancer Mother 68  . Heart attack Father 17  . Pneumonia Father   . Diabetes Sister   . Breast cancer Sister   . Breast cancer Sister 64    Social History   Socioeconomic History  . Marital status: Married    Spouse name: Not on file  . Number of children: 1  . Years of education: 62  . Highest education level: High school graduate  Occupational History    Employer: RETIRED  Tobacco Use  . Smoking status: Former  Smoker    Types: Cigarettes    Quit date: 05/04/2013    Years since quitting: 6.2  . Smokeless tobacco: Never Used  Substance and Sexual Activity  . Alcohol use: Yes    Comment: occasionally 3 times a year  . Drug use: No  . Sexual activity: Never    Partners: Male    Birth control/protection: None  Other Topics Concern  . Not on file  Social History Narrative   Married. Has one child. Grandmother for a great-grandmother and 1.   No real exercise.   Occasional alcohol consumption.   Quit smoking inf Feb 2015 -- after L Iliac Stent   Right-handed   Caffeine: 1 cup every morning   Right-handed.   Lives at home with husband.   Social Determinants of Health   Financial Resource Strain:   . Difficulty of Paying Living Expenses:   Food Insecurity:   . Worried About Charity fundraiser in the Last Year:   . Arboriculturist in the Last Year:   Transportation Needs:   . Film/video editor (Medical):   Marland Kitchen Lack of Transportation (Non-Medical):   Physical Activity:   . Days of Exercise per Week:   . Minutes of Exercise per Session:   Stress:   . Feeling of Stress :   Social Connections:   . Frequency of Communication with Friends and Family:   . Frequency of Social Gatherings with Friends and Family:   . Attends Religious Services:   . Active Member of Clubs or Organizations:   . Attends Archivist Meetings:   Marland Kitchen Marital Status:   Intimate Partner Violence:   . Fear of Current or Ex-Partner:   . Emotionally Abused:   Marland Kitchen Physically Abused:   . Sexually Abused:     Review of Systems: Review of Systems  Constitutional: Negative for chills and fever.  HENT: Negative for hearing loss and tinnitus.   Eyes: Negative for blurred vision and pain.  Respiratory: Positive for shortness of breath (on exertion). Negative for cough.   Cardiovascular: Positive for chest pain (on exertion). Negative for palpitations.  Gastrointestinal: Positive for diarrhea and melena. Negative  for abdominal pain, blood in stool, constipation, heartburn, nausea and vomiting.  Genitourinary: Negative for dysuria and hematuria.  Musculoskeletal: Positive for back pain (lower back). Negative for falls.  Skin: Negative for itching and rash.  Neurological: Positive  for dizziness. Negative for loss of consciousness.  Endo/Heme/Allergies: Negative for polydipsia. Does not bruise/bleed easily.  Psychiatric/Behavioral: Negative for substance abuse. The patient is not nervous/anxious.     Physical Exam: Vital signs: Vitals:   08/07/19 0630 08/07/19 0715  BP: (!) 128/56 (!) 134/54  Pulse: 72 74  Resp: (!) 23 19  Temp:    SpO2: 91% 97%      Physical Exam  Constitutional: She is oriented to person, place, and time. She appears well-developed and well-nourished. No distress.  HENT:  Head: Normocephalic and atraumatic.  Eyes: EOM are normal. No scleral icterus.  Mild conjunctival pallor  Neck:  Scar on left neck from carotid endarterectomy  Cardiovascular: Normal rate, regular rhythm and normal heart sounds.  Pulmonary/Chest: Effort normal and breath sounds normal. No respiratory distress.  Abdominal: Soft. Bowel sounds are normal. She exhibits no distension and no mass. There is no abdominal tenderness. There is no rebound and no guarding.  Musculoskeletal:        General: No deformity or edema.     Cervical back: Normal range of motion and neck supple.  Neurological: She is alert and oriented to person, place, and time.  Skin: Skin is warm and dry.  Psychiatric: She has a normal mood and affect. Her behavior is normal.     GI:  Lab Results: Recent Labs    08/06/19 1652 08/07/19 0740  WBC 8.0 6.8  HGB 5.8* 9.4*  HCT 20.0* 30.1*  PLT 241 222   BMET Recent Labs    08/06/19 1652  NA 141  K 5.1  CL 112*  CO2 23  GLUCOSE 106*  BUN 21  CREATININE 1.24*  CALCIUM 8.3*   LFT Recent Labs    08/06/19 1652  PROT 6.4*  ALBUMIN 3.0*  AST 16  ALT 9  ALKPHOS 43   BILITOT 0.8   PT/INR No results for input(s): LABPROT, INR in the last 72 hours.   Studies/Results: No results found.  Impression: Melena and symptomatic anemia in the setting of Plavix and Aspirin use. -Last dose of Plavix and Aspirin was Wednesday 5/5 -Hgb 5.8 on arrival (5/6), decreased from 8.5 on 4/28. -Stable vital signs (BP 130s/50s, HR 70s-80s) -BUN 19/ Cr 1.10 (similar to baseline)  CAD s/p carotid endarterectomy 07/28/19  CKD stage III, at baseline  Chronic diastolic CHF, at baseline  Plan: Enteroscopy tomorrow 5/8.  Clears OK today with NPO at midnight.  I thoroughly discussed the procedure, benefits, and risks (including but not limited to bleeding, infection, perforation).  Patient was given the opportunity ask questions.  Patient gave verbal consent to proceed with the procedure.  Continue Protonix 40mg  BID  Continue to monitor H&H with transfusion as needed to maintain Hgb >8  [facility] will follow.   LOS: 1 day   Salley Slaughter  PA-C 08/07/2019, 8:24 AM  Contact #  [phone]

## 2019-08-07 NOTE — ED Notes (Signed)
This RN assisted patient in ambulating to restroom; patient ambulated well.

## 2019-08-08 ENCOUNTER — Encounter (HOSPITAL_COMMUNITY)
Admission: EM | Disposition: A | Discharge status: Home / Self Care | Payer: Self-pay | Source: Home / Self Care | Attending: Internal Medicine

## 2019-08-08 ENCOUNTER — Encounter (HOSPITAL_COMMUNITY): Payer: Self-pay | Admitting: Family Medicine

## 2019-08-08 ENCOUNTER — Inpatient Hospital Stay (HOSPITAL_COMMUNITY): Payer: Medicare Other | Admitting: Certified Registered Nurse Anesthetist

## 2019-08-08 HISTORY — PX: HOT HEMOSTASIS: SHX5433

## 2019-08-08 HISTORY — PX: ENTEROSCOPY: SHX5533

## 2019-08-08 LAB — HEMOGLOBIN AND HEMATOCRIT, BLOOD
HCT: 30.2 % — ABNORMAL LOW (ref 36.0–46.0)
HCT: 27.9 % — ABNORMAL LOW (ref 36.0–46.0)
Hemoglobin: 9.4 g/dL — ABNORMAL LOW (ref 12.0–15.0)
Hemoglobin: 8.7 g/dL — ABNORMAL LOW (ref 12.0–15.0)

## 2019-08-08 LAB — BASIC METABOLIC PANEL
Anion gap: 7 (ref 5–15)
BUN: 17 mg/dL (ref 8–23)
CO2: 21 mmol/L — ABNORMAL LOW (ref 22–32)
Calcium: 8.4 mg/dL — ABNORMAL LOW (ref 8.9–10.3)
Chloride: 110 mmol/L (ref 98–111)
Creatinine, Ser: 1.08 mg/dL — ABNORMAL HIGH (ref 0.44–1.00)
GFR calc Af Amer: 59 mL/min — ABNORMAL LOW (ref >60)
GFR calc non Af Amer: 51 mL/min — ABNORMAL LOW (ref >60)
Glucose, Bld: 86 mg/dL (ref 70–99)
Potassium: 4.4 mmol/L (ref 3.5–5.1)
Sodium: 138 mmol/L (ref 135–145)

## 2019-08-08 LAB — CBC
HCT: 28.5 % — ABNORMAL LOW (ref 36.0–46.0)
Hemoglobin: 8.9 g/dL — ABNORMAL LOW (ref 12.0–15.0)
MCH: 28.4 pg (ref 26.0–34.0)
MCHC: 31.2 g/dL (ref 30.0–36.0)
MCV: 91.1 fL (ref 80.0–100.0)
Platelets: 207 10*3/uL (ref 150–400)
RBC: 3.13 MIL/uL — ABNORMAL LOW (ref 3.87–5.11)
RDW: 18.9 % — ABNORMAL HIGH (ref 11.5–15.5)
WBC: 5.5 10*3/uL (ref 4.0–10.5)
nRBC: 0 % (ref 0.0–0.2)

## 2019-08-08 SURGERY — ENTEROSCOPY
Anesthesia: Monitor Anesthesia Care

## 2019-08-08 MED ORDER — ATORVASTATIN CALCIUM 80 MG PO TABS
80.0000 mg | ORAL_TABLET | Freq: Every day | ORAL | Status: DC
  Administered 2019-08-08 – 2019-08-09 (×2): 80 mg via ORAL
  Filled 2019-08-08 (×2): qty 1

## 2019-08-08 MED ORDER — EZETIMIBE 10 MG PO TABS
10.0000 mg | ORAL_TABLET | Freq: Every day | ORAL | Status: DC
  Administered 2019-08-08 – 2019-08-09 (×2): 10 mg via ORAL
  Filled 2019-08-08 (×2): qty 1

## 2019-08-08 MED ORDER — FLUTICASONE FUROATE-VILANTEROL 100-25 MCG/INH IN AEPB: 1.0000 | INHALATION_SPRAY | Freq: Every day | RESPIRATORY_TRACT | Status: DC

## 2019-08-08 MED ORDER — PANTOPRAZOLE SODIUM 40 MG PO TBEC: 40.0000 mg | DELAYED_RELEASE_TABLET | Freq: Every day | ORAL | Status: DC

## 2019-08-08 MED ORDER — ISOSORBIDE MONONITRATE ER 60 MG PO TB24
90.0000 mg | ORAL_TABLET | Freq: Every day | ORAL | Status: DC
  Administered 2019-08-08 – 2019-08-09 (×2): 90 mg via ORAL
  Filled 2019-08-08 (×2): qty 1

## 2019-08-08 MED ORDER — SODIUM CHLORIDE 0.9 % IV SOLN: INTRAVENOUS | Status: DC

## 2019-08-08 MED ORDER — PANTOPRAZOLE SODIUM 40 MG PO TBEC
40.0000 mg | DELAYED_RELEASE_TABLET | Freq: Two times a day (BID) | ORAL | Status: DC
  Administered 2019-08-08 – 2019-08-09 (×2): 40 mg via ORAL
  Filled 2019-08-08 (×2): qty 1

## 2019-08-08 MED ORDER — METOPROLOL SUCCINATE ER 100 MG PO TB24
200.0000 mg | ORAL_TABLET | Freq: Every day | ORAL | Status: DC
  Administered 2019-08-08: 200 mg via ORAL
  Filled 2019-08-08: qty 2

## 2019-08-08 MED ORDER — ALBUTEROL SULFATE HFA 108 (90 BASE) MCG/ACT IN AERS: 2.0000 | INHALATION_SPRAY | Freq: Four times a day (QID) | RESPIRATORY_TRACT | Status: DC | PRN

## 2019-08-08 MED ORDER — NITROGLYCERIN 0.4 MG SL SUBL: 0.4000 mg | SUBLINGUAL_TABLET | SUBLINGUAL | Status: DC | PRN

## 2019-08-08 MED ORDER — PROPOFOL 500 MG/50ML IV EMUL
INTRAVENOUS | Status: DC | PRN
  Administered 2019-08-08: 100 ug/kg/min via INTRAVENOUS

## 2019-08-08 MED ORDER — FERROUS GLUCONATE 324 (38 FE) MG PO TABS
324.0000 mg | ORAL_TABLET | Freq: Two times a day (BID) | ORAL | Status: DC
  Administered 2019-08-08 – 2019-08-09 (×2): 324 mg via ORAL
  Filled 2019-08-08 (×3): qty 1

## 2019-08-08 MED ORDER — PROPOFOL 10 MG/ML IV BOLUS
INTRAVENOUS | Status: DC | PRN
  Administered 2019-08-08 (×3): 20 mg via INTRAVENOUS

## 2019-08-08 MED ORDER — MENTHOL 3 MG MT LOZG
1.0000 | LOZENGE | OROMUCOSAL | Status: DC | PRN
  Filled 2019-08-08: qty 9

## 2019-08-08 MED ORDER — OXYCODONE HCL 5 MG PO TABS
5.0000 mg | ORAL_TABLET | ORAL | Status: DC | PRN
  Administered 2019-08-08 – 2019-08-09 (×2): 5 mg via ORAL
  Filled 2019-08-08 (×2): qty 1

## 2019-08-08 NOTE — Anesthesia Postprocedure Evaluation (Signed)
Anesthesia Post Note  Patient: Kayla Mcclure  Procedure(s) Performed: ENTEROSCOPY (N/A ) HOT HEMOSTASIS (ARGON PLASMA COAGULATION/BICAP) (N/A )     Patient location during evaluation: Endoscopy Anesthesia Type: MAC Level of consciousness: awake and alert Pain management: pain level controlled Vital Signs Assessment: post-procedure vital signs reviewed and stable Respiratory status: spontaneous breathing, nonlabored ventilation, respiratory function stable and patient connected to nasal cannula oxygen Cardiovascular status: stable and blood pressure returned to baseline Postop Assessment: no apparent nausea or vomiting Anesthetic complications: no    Last Vitals:  Vitals:   08/08/19 1115 08/08/19 1352  BP: (!) 153/47 137/63  Pulse: 68 67  Resp: (!) 23 20  Temp:  36.9 C  SpO2:  100%    Last Pain:  Vitals:   08/08/19 1352  TempSrc: Oral  PainSc:                  Catalina Gravel

## 2019-08-08 NOTE — Op Note (Signed)
[facility] Patient Name: Kayla Mcclure Procedure Date : 08/08/2019 MRN: [id number] Attending MD: Ronnette Juniper , MD Date of Birth: 01-22-1946 CSN: [id number] Age: 74 Admit Type: Inpatient Procedure:                Small bowel enteroscopy Indications:              Melena Providers:                Ronnette Juniper, MD, Benay Pillow, RN, Laverda Sorenson,                            Technician, Clearnce Sorrel, CRNA Referring MD:             [facility] Medicines:                Monitored Anesthesia Care Complications:            No immediate complications. Estimated Blood Loss:     Estimated blood loss: none. Procedure:                Pre-Anesthesia Assessment:                           - Prior to the procedure, a History and Physical                            was performed, and patient medications and                            allergies were reviewed. The patient's tolerance of                            previous anesthesia was also reviewed. The risks                            and benefits of the procedure and the sedation                            options and risks were discussed with the patient.                            All questions were answered, and informed consent                            was obtained. Prior Anticoagulants: The patient has                            taken Plavix (clopidogrel), last dose was 2 days                            prior to procedure. ASA Grade Assessment: III - A                            patient with severe systemic disease. After  reviewing the risks and benefits, the patient was                            deemed in satisfactory condition to undergo the                            procedure.                           After obtaining informed consent, the endoscope was                            passed under direct vision. Throughout the                            procedure, the patient's blood pressure, pulse,  and                            oxygen saturations were monitored continuously. The                            PCF-H190DL ([id number]) Olympus pediatric colonoscope                            was introduced through the mouth and advanced to                            the mid-jejunum. The small bowel enteroscopy was                            accomplished without difficulty. The patient                            tolerated the procedure well. Scope In: Scope Out: Findings:      The esophagus was normal.      The stomach was normal.      A single angioectasia with no bleeding was found in the first portion of       the duodenum. Coagulation for tissue destruction using argon plasma at 1       liter/minute and 15 watts was successful.      A single angioectasia with no bleeding was found in the proximal       jejunum. Coagulation for tissue destruction using argon plasma at 1       liter/minute and 15 watts was successful. Impression:               - Normal esophagus.                           - Normal stomach.                           - A single non-bleeding angioectasia in the                            duodenum. Treated with argon plasma coagulation                            (  APC).                           - A single non-bleeding angioectasia in the                            jejunum. Treated with argon plasma coagulation                            (APC).                           - No specimens collected. Moderate Sedation:      Patient did not receive moderate sedation for this procedure, but       instead received monitored anesthesia care. Recommendation:           - Full liquid diet.                           - Monitor H and H, if Hb is stable, ok to resume                            plavix in am.                           - If there is further drop in HB or ongoing melena,                            patient will benefit from double balloon                             enteroscopy. Procedure Code(s):        --- Professional ---                           [phone], Small intestinal endoscopy, enteroscopy                            beyond second portion of duodenum, not including                            ileum; with ablation of tumor(s), polyp(s), or                            other lesion(s) not amenable to removal by hot                            biopsy forceps, bipolar cautery or snare technique Diagnosis Code(s):        --- Professional ---                           Z16.967, Angiodysplasia of stomach and duodenum                            without bleeding  K55.20, Angiodysplasia of colon without hemorrhage                           K92.1, Melena (includes Hematochezia) CPT copyright 2019 American Medical Association. All rights reserved. The codes documented in this report are preliminary and upon coder review may  be revised to meet current compliance requirements. Ronnette Juniper, MD 08/08/2019 10:50:19 AM This report has been signed electronically. Number of Addenda: 0

## 2019-08-08 NOTE — Brief Op Note (Signed)
08/06/2019 - 08/08/2019  10:44 AM  PATIENT:  Kayla Mcclure  74 y.o. female  PRE-OPERATIVE DIAGNOSIS:  melena, anemia  POST-OPERATIVE DIAGNOSIS:  1 Gastric AVM, 1 duodenal AVM treated wih APC   PROCEDURE:  Procedure(s): ENTEROSCOPY (N/A) HOT HEMOSTASIS (ARGON PLASMA COAGULATION/BICAP) (N/A)  SURGEON:  Surgeon(s) and Role:    Ronnette Juniper, MD - Primary  PHYSICIAN ASSISTANT:   ASSISTANTS: Vonzell Schlatter  ANESTHESIA:   MAC  EBL:  None  BLOOD ADMINISTERED:none  DRAINS: none   LOCAL MEDICATIONS USED:  NONE  SPECIMEN:  No Specimen  DISPOSITION OF SPECIMEN:  N/A  COUNTS:  YES  TOURNIQUET:  * No tourniquets in log *  DICTATION: .Dragon Dictation  PLAN OF CARE: Admit to inpatient   PATIENT DISPOSITION:  PACU - hemodynamically stable.   Delay start of Pharmacological VTE agent (>24hrs) due to surgical blood loss or risk of bleeding: yes

## 2019-08-08 NOTE — Transfer of Care (Signed)
Immediate Anesthesia Transfer of Care Note  Patient: Kayla Mcclure  Procedure(s) Performed: ENTEROSCOPY (N/A ) HOT HEMOSTASIS (ARGON PLASMA COAGULATION/BICAP) (N/A )  Patient Location: Endoscopy Unit  Anesthesia Type:MAC  Level of Consciousness: sedated  Airway & Oxygen Therapy: Patient Spontanous Breathing and Patient connected to nasal cannula oxygen  Post-op Assessment: Report given to RN and Post -op Vital signs reviewed and stable  Post vital signs: Reviewed and stable  Last Vitals:  Vitals Value Taken Time  BP 102/28 08/08/19 1048  Temp    Pulse 88 08/08/19 1049  Resp 33 08/08/19 1049  SpO2 98 % 08/08/19 1049  Vitals shown include unvalidated device data.  Last Pain:  Vitals:   08/08/19 0941  TempSrc: Oral  PainSc: 0-No pain         Complications: No apparent anesthesia complications

## 2019-08-08 NOTE — Anesthesia Preprocedure Evaluation (Signed)
Anesthesia Evaluation  Patient identified by MRN, date of birth, ID band Patient awake    Reviewed: Allergy & Precautions, NPO status , Patient's Chart, lab work & pertinent test results  History of Anesthesia Complications Negative for: history of anesthetic complications  Airway Mallampati: II  TM Distance: >3 FB Neck ROM: Full    Dental  (+) Dental Advisory Given, Missing   Pulmonary asthma , former smoker,    Pulmonary exam normal breath sounds clear to auscultation       Cardiovascular hypertension, (-) angina+ CAD, + Past MI, + Peripheral Vascular Disease and +CHF  Normal cardiovascular exam Rhythm:Regular Rate:Normal  05/2019 ECHO: EF 60-65%, Grade II diastolic dysfunction,   1025: Low Risk = Normal stress Test. No sign of heart artery blockage that would cause symptoms. Normal EF.    Neuro/Psych TIAnegative psych ROS   GI/Hepatic Neg liver ROS, GERD  ,GIB   Endo/Other  negative endocrine ROS  Renal/GU Renal InsufficiencyRenal disease     Musculoskeletal negative musculoskeletal ROS (+)   Abdominal   Peds  Hematology  (+) Blood dyscrasia (Plavix), anemia ,   Anesthesia Other Findings Day of surgery medications reviewed with the patient.  Reproductive/Obstetrics                             Anesthesia Physical Anesthesia Plan  ASA: III  Anesthesia Plan: MAC   Post-op Pain Management:    Induction: Intravenous  PONV Risk Score and Plan: 2 and Treatment may vary due to age or medical condition and Propofol infusion  Airway Management Planned: Nasal Cannula and Natural Airway  Additional Equipment:   Intra-op Plan:   Post-operative Plan:   Informed Consent: I have reviewed the patients History and Physical, chart, labs and discussed the procedure including the risks, benefits and alternatives for the proposed anesthesia with the patient or authorized representative who  has indicated his/her understanding and acceptance.     Dental advisory given  Plan Discussed with: CRNA and Anesthesiologist  Anesthesia Plan Comments:         Anesthesia Quick Evaluation

## 2019-08-08 NOTE — Progress Notes (Signed)
PROGRESS NOTE    Kayla Mcclure  GYK:[id number] DOB: [birth year] DOA: 08/06/2019 PCP: [name], MD   Chief Complaint  Patient presents with  . GI Bleed    Brief Narrative: HPI per Dr. Myna Hidalgo on 08/06/2019 Kayla Mcclure Thomasis a 74 y.o.femalewith medical history significant forcoronary artery disease, chronic diastolic CHF, chronic kidney disease stage IIIa, and history of symptomatic carotid stenosis status post left CEA on 07/28/2019, now presenting to the emergency department with black stools, exertional dyspnea, and low hemoglobin on outpatient blood work. Patient reports that she had been in her usual state of health and doing well after her CEA, but noticed black stool beginning 3 or 4 days ago and has been experiencing exertional dyspnea. Patient reports that she develops chest discomfort with heavy exertion and has been experiencing that more frequently over the past couple days, but denies any angina at rest or with mild exertion. She denies any abdominal pain, nausea, or vomiting. She reports chronic dark stools that she attributes to iron but they have been essentially black and more greasy the last few days. She denies any fevers, chills, or cough. She denies any lower extremity swelling. She continues to take a PPI daily at home and denies NSAID use.  She received 1 unit of RBC after her recent left CEA. 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 She was admitted for symptomatic anemia in February 2021 and had repeat small bowel enteroscopy with no angiodysplasia or other source of GI bleeding identified.  ED Course:Upon arrival to the ED, patient is found to be afebrile, saturating well on room air, and with stable blood pressure. Chemistry panel with creatinine 1.24, consistent with her apparent baseline. BUN is normal. CBC is notable for hemoglobin of 5.8, down from 8.5 about a week ago. Fecal occult blood testing is positive. 2 units of  packed red blood cells were ordered for transfusion, IV PPI was started, and gastroenterology was consulted by the ED physician.  5/7 -patient had 2 units of blood transfusion.  Has not had any bowel movement since coming to the hospital.  Denies any nausea vomiting.  However she has been having black stools since 3 days prior to admission.  She takes iron at home and her stools have always been black however she noticed it was a different black for the last 3 days.    Assessment & Plan:   Principal Problem:   Acute GI bleeding Active Problems:   Essential hypertension, benign   CAD S/P percutaneous coronary angioplasty -- Promus DES 2.5 mm x 20 mm postdilated to 3 mm   Asthma   Symptomatic anemia   Chronic diastolic CHF (congestive heart failure) (HCC)   CKD (chronic kidney disease), stage III    #1 acute GI bleeding- patient presenting with melena and dyspnea on exertion.  Hemoglobin was 5.80 down from 8.5 on 07/29/2019.  She was FOBT positive in the ER.  She received 2 units of blood transfusion.  Hemoglobin after transfusion is 9.4.  She is on aspirin and Plavix at home.  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 Small bowel enteroscopy 08/08/2019-esophagus was normal stomach was normal.  Single angiectasia with no bleeding was found in the first portion of the duodenum and in the proximal jejunum coagulation with APC done. GI recommends full liquid diet follow-up H&H if stable okay to start Plavix in the morning.  If she has further bleeding  she will benefit from double-balloon enteroscopy. Hb 9.4 today  -#2 stage III CKD at baseline creatinine 3.01  #3 diastolic CHF stable and euvolemic  #4 status post left carotid endarterectomy 07/28/2019  #5 CAD aspirin and Plavix on hold due to GI bleed  #6 history of asthma continue home meds  DVT prophylaxis: SCD  code Status: Full code Family Communication: Discussed with patient   disposition: Status is inpatient patient admitted with acute GI bleed plan for enteroscopy in a.m.  Dispo: The patient is from: Home  Anticipated d/c is to: Home  Anticipated d/c date is: 1 day  Patient currently is not medically stable to d/c.   Consultants:  GI Procedures: None Antimicrobials: None  Subjective: Had one black bm today  Objective: Vitals:   08/08/19 1100 08/08/19 1110 08/08/19 1115 08/08/19 1352  BP: (!) 124/42 (!) 146/47 (!) 153/47 137/63  Pulse: 65 70 68 67  Resp: (!) 25 15 (!) 23 20  Temp:    98.5 F (36.9 C)  TempSrc:    Oral  SpO2: 100%   100%  Weight:      Height:        Intake/Output Summary (Last 24 hours) at 08/08/2019 1453 Last data filed at 08/08/2019 0700 Gross per 24 hour  Intake 648 ml  Output 1550 ml  Net -902 ml   Filed Weights   08/06/19 1630 08/08/19 0031 08/08/19 0941  Weight: 62.1 kg 60.5 kg 60.5 kg    Examination:  General exam: Appears calm and comfortable  Respiratory system: Clear to auscultation. Respiratory effort normal. Cardiovascular system: S1 & S2 heard, RRR. No JVD, murmurs, rubs, gallops or clicks. No pedal edema. Gastrointestinal system: Abdomen is nondistended, soft and nontender. No organomegaly or masses felt. Normal bowel sounds heard. Central nervous system: Alert and oriented. No focal neurological deficits. Extremities: Symmetric 5 x 5 power. Skin: No rashes, lesions or ulcers Psychiatry: Judgement and insight appear normal. Mood & affect appropriate.     Data Reviewed: I have personally reviewed following labs and imaging studies  CBC: Recent Labs  Lab 08/06/19 1652 08/07/19 0740 08/07/19 1631 08/08/19 0815 08/08/19 1317  WBC 8.0 6.8  --  5.5  --   NEUTROABS 5.2  --   --   --   --   HGB 5.8* 9.4* 9.7* 8.9* 9.4*  HCT 20.0* 30.1* 30.6* 28.5* 30.2*  MCV 87.7 92.0  --  91.1  --   PLT 241 222  --  207  --     Basic Metabolic Panel: Recent Labs  Lab  08/06/19 1652 08/07/19 0740 08/08/19 0815  NA 141 138 138  K 5.1 4.8 4.4  CL 112* 112* 110  CO2 23 21* 21*  GLUCOSE 106* 90 86  BUN 21 19 17   CREATININE 1.24* 1.10* 1.08*  CALCIUM 8.3* 7.8* 8.4*    GFR: Estimated Creatinine Clearance: 37.2 mL/min (A) (by C-G formula based on SCr of 1.08 mg/dL (H)).  Liver Function Tests: Recent Labs  Lab 08/06/19 1652  AST 16  ALT 9  ALKPHOS 43  BILITOT 0.8  PROT 6.4*  ALBUMIN 3.0*    CBG: No results for input(s): GLUCAP in the last 168 hours.   Recent Results (from the past 240 hour(s))  Respiratory Panel by RT PCR (Flu A&B, Covid) - Nasopharyngeal Swab     Status: None   Collection Time: 08/06/19  7:55 PM   Specimen: Nasopharyngeal Swab  Result Value Ref Range Status   SARS Coronavirus 2  by RT PCR NEGATIVE NEGATIVE Final    Comment: (NOTE) SARS-CoV-2 target nucleic acids are NOT DETECTED. The SARS-CoV-2 RNA is generally detectable in upper respiratoy specimens during the acute phase of infection. The lowest concentration of SARS-CoV-2 viral copies this assay can detect is 131 copies/mL. A negative result does not preclude SARS-Cov-2 infection and should not be used as the sole basis for treatment or other patient management decisions. A negative result may occur with  improper specimen collection/handling, submission of specimen other than nasopharyngeal swab, presence of viral mutation(s) within the areas targeted by this assay, and inadequate number of viral copies (<131 copies/mL). A negative result must be combined with clinical observations, patient history, and epidemiological information. The expected result is Negative. Fact Sheet for Patients:  PinkCheek.be Fact Sheet for Healthcare Providers:  GravelBags.it This test is not yet ap proved or cleared by the [place] FDA and  has been authorized for detection and/or diagnosis of SARS-CoV-2 by FDA under an  Emergency Use Authorization (EUA). This EUA will remain  in effect (meaning this test can be used) for the duration of the COVID-19 declaration under Section 564(b)(1) of the Act, 21 U.S.C. section 360bbb-3(b)(1), unless the authorization is terminated or revoked sooner.    Influenza A by PCR NEGATIVE NEGATIVE Final   Influenza B by PCR NEGATIVE NEGATIVE Final    Comment: (NOTE) The Xpert Xpress SARS-CoV-2/FLU/RSV assay is intended as an aid in  the diagnosis of influenza from Nasopharyngeal swab specimens and  should not be used as a sole basis for treatment. Nasal washings and  aspirates are unacceptable for Xpert Xpress SARS-CoV-2/FLU/RSV  testing. Fact Sheet for Patients: PinkCheek.be Fact Sheet for Healthcare Providers: GravelBags.it This test is not yet approved or cleared by the [place] FDA and  has been authorized for detection and/or diagnosis of SARS-CoV-2 by  FDA under an Emergency Use Authorization (EUA). This EUA will remain  in effect (meaning this test can be used) for the duration of the  Covid-19 declaration under Section 564(b)(1) of the Act, 21  U.S.C. section 360bbb-3(b)(1), unless the authorization is  terminated or revoked. Performed at [facility], [place] [address] [place], [place] [address]          Radiology Studies: No results found.      Scheduled Meds: . atorvastatin  80 mg Oral Daily  . ezetimibe  10 mg Oral Daily  . ferrous gluconate  324 mg Oral BID WC  . fluticasone furoate-vilanterol  1 puff Inhalation Daily  . isosorbide mononitrate  90 mg Oral Daily  . metoprolol succinate  200 mg Oral QHS  . pantoprazole  40 mg Oral BID  . sodium chloride flush  3 mL Intravenous Q12H   Continuous Infusions: . sodium chloride 75 mL/hr at 08/08/19 1157     LOS: 2 days     Georgette Shell, MD [facility]   To contact the attending provider between 7A-7P or the  covering provider during after hours 7P-7A, please log into the web site [email or web address] and access using universal [facility] password for that web site. If you do not have the password, please call the hospital operator.  08/08/2019, 2:53 PM

## 2019-08-08 NOTE — Interval H&P Note (Signed)
History and Physical Interval Note: 73/female with melena and anemia, history of Avms, on plavix, last dose 08/05/19 for an enteroscopy.  08/08/2019 10:23 AM  Kayla Mcclure Needs  has presented today for enteroscopy, with the diagnosis of melena, anemia.  The various methods of treatment have been discussed with the patient and family. After consideration of risks, benefits and other options for treatment, the patient has consented to  Procedure(s): ENTEROSCOPY (N/A) as a surgical intervention.  The patient's history has been reviewed, patient examined, no change in status, stable for surgery.  I have reviewed the patient's chart and labs.  Questions were answered to the patient's satisfaction.     Ronnette Juniper

## 2019-08-09 LAB — CBC
HCT: 26.4 % — ABNORMAL LOW (ref 36.0–46.0)
Hemoglobin: 7.9 g/dL — ABNORMAL LOW (ref 12.0–15.0)
MCH: 27.7 pg (ref 26.0–34.0)
MCHC: 29.9 g/dL — ABNORMAL LOW (ref 30.0–36.0)
MCV: 92.6 fL (ref 80.0–100.0)
Platelets: 203 10*3/uL (ref 150–400)
RBC: 2.85 MIL/uL — ABNORMAL LOW (ref 3.87–5.11)
RDW: 19.3 % — ABNORMAL HIGH (ref 11.5–15.5)
WBC: 6.8 10*3/uL (ref 4.0–10.5)
nRBC: 0 % (ref 0.0–0.2)

## 2019-08-09 LAB — BASIC METABOLIC PANEL
Anion gap: 7 (ref 5–15)
BUN: 13 mg/dL (ref 8–23)
CO2: 20 mmol/L — ABNORMAL LOW (ref 22–32)
Calcium: 7.9 mg/dL — ABNORMAL LOW (ref 8.9–10.3)
Chloride: 112 mmol/L — ABNORMAL HIGH (ref 98–111)
Creatinine, Ser: 1.14 mg/dL — ABNORMAL HIGH (ref 0.44–1.00)
GFR calc Af Amer: 56 mL/min — ABNORMAL LOW (ref >60)
GFR calc non Af Amer: 48 mL/min — ABNORMAL LOW (ref >60)
Glucose, Bld: 96 mg/dL (ref 70–99)
Potassium: 4.7 mmol/L (ref 3.5–5.1)
Sodium: 139 mmol/L (ref 135–145)

## 2019-08-09 LAB — HEMOGLOBIN AND HEMATOCRIT, BLOOD
HCT: 26.5 % — ABNORMAL LOW (ref 36.0–46.0)
Hemoglobin: 8.2 g/dL — ABNORMAL LOW (ref 12.0–15.0)

## 2019-08-09 MED ORDER — ONDANSETRON HCL 4 MG PO TABS: 4.0000 mg | ORAL_TABLET | Freq: Four times a day (QID) | ORAL | 0 refills | Status: DC | PRN

## 2019-08-09 NOTE — Discharge Summary (Signed)
Physician Discharge Summary  Kayla Mcclure JFH:[id number] DOB: June 21, 1945 DOA: 08/06/2019  PCP: [name], MD  Admit date: 08/06/2019 Discharge date: 08/09/2019  Admitted From: Home Disposition: Home Recommendations for Outpatient Follow-up:  1. Follow up with PCP in 1-2 weeks 2. Please obtain BMP/CBC in one week   Home Health: None Equipment/Devices none  Discharge Condition stable and improved  CODE STATUS: Full code Diet recommendation: Cardiac diet Brief/Interim Summary:HPI per Dr. Myna Hidalgo on 08/06/2019 Kayla Mcclure Thomasis a 74 y.o.femalewith medical history significant forcoronary artery disease, chronic diastolic CHF, chronic kidney disease stage IIIa, and history of symptomatic carotid stenosis status post left CEA on 07/28/2019, now presenting to the emergency department with black stools, exertional dyspnea, and low hemoglobin on outpatient blood work. Patient reports that she had been in her usual state of health and doing well after her CEA, but noticed black stool beginning 3 or 4 days ago and has been experiencing exertional dyspnea. Patient reports that she develops chest discomfort with heavy exertion and has been experiencing that more frequently over the past couple days, but denies any angina at rest or with mild exertion. She denies any abdominal pain, nausea, or vomiting. She reports chronic dark stools that she attributes to iron but they have been essentially black and more greasy the last few days. She denies any fevers, chills, or cough. She denies any lower extremity swelling. She continues to take a PPI daily at home and denies NSAID use.  She received 1 unit of RBC after her recent left CEA. 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 She was admitted for symptomatic anemia in February 2021 and had repeat small bowel enteroscopy with no angiodysplasia or other source of GI bleeding identified.  ED Course:Upon arrival to the ED,  patient is found to be afebrile, saturating well on room air, and with stable blood pressure. Chemistry panel with creatinine 1.24, consistent with her apparent baseline. BUN is normal. CBC is notable for hemoglobin of 5.8, down from 8.5 about a week ago. Fecal occult blood testing is positive. 2 units of packed red blood cells were ordered for transfusion, IV PPI was started, and gastroenterology was consulted by the ED physician. 5/7 -patient had 2 units of blood transfusion. Has not had any bowel movement since coming to the hospital. Denies any nausea vomiting. However she has been having black stools since 3 days prior to admission. She takes iron at home and her stools have always been black however she noticed it was a different black for the last 3 days.   Discharge Diagnoses:  Principal Problem:   Acute GI bleeding Active Problems:   Essential hypertension, benign   CAD S/P percutaneous coronary angioplasty -- Promus DES 2.5 mm x 20 mm postdilated to 3 mm   Asthma   Symptomatic anemia   Chronic diastolic CHF (congestive heart failure) (HCC)   CKD (chronic kidney disease), stage III   #1 acute GI bleeding- patient presenting with melena and dyspnea on exertion. Hemoglobin was 5.80 down from 8.5 on 07/29/2019. She was FOBT positive in the ER. She received 2 units of blood transfusion. Hemoglobin after transfusion is 9.4. She was on aspirin and Plavix at home which was on hold during the hospital stay. 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 Small bowel enteroscopy 08/08/2019-esophagus was normal stomach was normal.  Single angiectasia with no bleeding was found in the first portion of the duodenum  and in the proximal jejunum coagulation with APC done. Patient tolerated diet prior to discharge.  She was ambulating in the room without any complaints of nausea vomiting diarrhea melena or lightheadedness.  Restart Plavix in the  morning. Hemoglobin on the day of discharge 7.9.  Vital signs normal.  History is not tachycardic.  Drop in hemoglobin to 7.9 from 9.4 after blood transfusion likely also related to hemodilution.  No signs of active bleeding.  -#2 stage III CKD at baseline creatinine 6.26  #3 diastolic CHF stable and euvolemic  #4 status post left carotid endarterectomy 07/28/2019  #5 CAD resume aspirin and Plavix in a.m.    #6 history of asthma continue home meds Discharge Instructions  Discharge Instructions    Call MD for:  difficulty breathing, headache or visual disturbances   Complete by: As directed    Call MD for:  severe uncontrolled pain   Complete by: As directed    Diet - low sodium heart healthy   Complete by: As directed    Increase activity slowly   Complete by: As directed      Allergies as of 08/09/2019   No Known Allergies     Medication List    STOP taking these medications   gabapentin 300 MG capsule Commonly known as: NEURONTIN     TAKE these medications   albuterol 108 (90 Base) MCG/ACT inhaler Commonly known as: VENTOLIN HFA Inhale 2 puffs into the lungs every 6 (six) hours as needed for wheezing or shortness of breath.   aspirin EC 81 MG tablet Take 81 mg by mouth daily.   atorvastatin 80 MG tablet Commonly known as: LIPITOR Take 1 tablet (80 mg total) by mouth daily.   Breo Ellipta 100-25 MCG/INH Aepb Generic drug: fluticasone furoate-vilanterol Inhale 1 puff into the lungs daily.   clopidogrel 75 MG tablet Commonly known as: PLAVIX Take 1 tablet (75 mg total) by mouth daily.   ezetimibe 10 MG tablet Commonly known as: ZETIA Take 1 tablet (10 mg total) by mouth daily.   ferrous gluconate 324 MG tablet Commonly known as: FERGON Take 1 tablet (324 mg total) by mouth 2 (two) times daily with a meal.   isosorbide mononitrate 60 MG 24 hr tablet Commonly known as: IMDUR Take 1.5 tablets (90 mg total) by mouth daily.   metoprolol succinate 100 MG  24 hr tablet Commonly known as: TOPROL-XL Take 2 tablets (200 mg total) by mouth at bedtime.   nitroGLYCERIN 0.4 MG SL tablet Commonly known as: NITROSTAT Place 1 tablet (0.4 mg total) under the tongue every 5 (five) minutes as needed for chest pain.   ondansetron 4 MG tablet Commonly known as: ZOFRAN Take 1 tablet (4 mg total) by mouth every 6 (six) hours as needed for nausea.   oxyCODONE 5 MG immediate release tablet Commonly known as: Oxy IR/ROXICODONE Take 1 tablet (5 mg total) by mouth every 4 (four) hours as needed for moderate pain.   pantoprazole 40 MG tablet Commonly known as: PROTONIX Take 1 tablet (40 mg total) by mouth daily.      Follow-up Information    [name], MD Follow up.   Specialty: Family Medicine Contact information: [place] [address] [phone]        Leonie Man, MD .   Specialty: Cardiology Contact information: [address] [place] [address] [phone]          No Known Allergies  Consultations: GI Procedures/Studies:  No results found. (Echo, Carotid, EGD, Colonoscopy, ERCP)    Subjective:  Patient ambulating in the room with IV pole in her hand anxious to go home Denies any nausea vomiting or melena.  No diarrhea.  Tolerating p.o. intake. Discharge Exam:  Vitals:   08/09/19 0813 08/09/19 1138  BP:  133/60  Pulse:  62  Resp:  20  Temp:  98.2 F (36.8 C)  SpO2: 96% 99%   Vitals:   08/09/19 0402 08/09/19 0729 08/09/19 0813 08/09/19 1138  BP: 125/61 129/60  133/60  Pulse: 63 66  62  Resp: 18 20  20   Temp: 98.6 F (37 C) 98.6 F (37 C)  98.2 F (36.8 C)  TempSrc: Oral Oral  Oral  SpO2: 100% 97% 96% 99%  Weight: 61.5 kg     Height:        General: Pt is alert, awake, not in acute distress Cardiovascular: RRR, S1/S2 +, no rubs, no gallops Respiratory: CTA bilaterally, no wheezing, no rhonchi Abdominal: Soft, NT, ND, bowel sounds + Extremities: no edema,  no cyanosis    The results of significant diagnostics from this hospitalization (including imaging, microbiology, ancillary and laboratory) are listed below for reference.     Microbiology: Recent Results (from the past 240 hour(s))  Respiratory Panel by RT PCR (Flu A&B, Covid) - Nasopharyngeal Swab     Status: None   Collection Time: 08/06/19  7:55 PM   Specimen: Nasopharyngeal Swab  Result Value Ref Range Status   SARS Coronavirus 2 by RT PCR NEGATIVE NEGATIVE Final    Comment: (NOTE) SARS-CoV-2 target nucleic acids are NOT DETECTED. The SARS-CoV-2 RNA is generally detectable in upper respiratoy specimens during the acute phase of infection. The lowest concentration of SARS-CoV-2 viral copies this assay can detect is 131 copies/mL. A negative result does not preclude SARS-Cov-2 infection and should not be used as the sole basis for treatment or other patient management decisions. A negative result may occur with  improper specimen collection/handling, submission of specimen other than nasopharyngeal swab, presence of viral mutation(s) within the areas targeted by this assay, and inadequate number of viral copies (<131 copies/mL). A negative result must be combined with clinical observations, patient history, and epidemiological information. The expected result is Negative. Fact Sheet for Patients:  PinkCheek.be Fact Sheet for Healthcare Providers:  GravelBags.it This test is not yet ap proved or cleared by the [place] FDA and  has been authorized for detection and/or diagnosis of SARS-CoV-2 by FDA under an Emergency Use Authorization (EUA). This EUA will remain  in effect (meaning this test can be used) for the duration of the COVID-19 declaration under Section 564(b)(1) of the Act, 21 U.S.C. section 360bbb-3(b)(1), unless the authorization is terminated or revoked sooner.    Influenza A by PCR NEGATIVE NEGATIVE  Final   Influenza B by PCR NEGATIVE NEGATIVE Final    Comment: (NOTE) The Xpert Xpress SARS-CoV-2/FLU/RSV assay is intended as an aid in  the diagnosis of influenza from Nasopharyngeal swab specimens and  should not be used as a sole basis for treatment. Nasal washings and  aspirates are unacceptable for Xpert Xpress SARS-CoV-2/FLU/RSV  testing. Fact Sheet for Patients: PinkCheek.be Fact Sheet for Healthcare Providers: GravelBags.it This test is not yet approved or cleared by the [place] FDA and  has been authorized for detection and/or diagnosis of SARS-CoV-2 by  FDA under an Emergency Use Authorization (EUA). This EUA will remain  in effect (meaning this test can be used) for  the duration of the  Covid-19 declaration under Section 564(b)(1) of the Act, 21  U.S.C. section 360bbb-3(b)(1), unless the authorization is  terminated or revoked. Performed at [facility], [place] [address], [place], [place] [address]      Labs: BNP (last 3 results) No results for input(s): BNP in the last 8760 hours. Basic Metabolic Panel: Recent Labs  Lab 08/06/19 1652 08/07/19 0740 08/08/19 0815 08/09/19 0459  NA 141 138 138 139  K 5.1 4.8 4.4 4.7  CL 112* 112* 110 112*  CO2 23 21* 21* 20*  GLUCOSE 106* 90 86 96  BUN 21 19 17 13   CREATININE 1.24* 1.10* 1.08* 1.14*  CALCIUM 8.3* 7.8* 8.4* 7.9*   Liver Function Tests: Recent Labs  Lab 08/06/19 1652  AST 16  ALT 9  ALKPHOS 43  BILITOT 0.8  PROT 6.4*  ALBUMIN 3.0*   No results for input(s): LIPASE, AMYLASE in the last 168 hours. No results for input(s): AMMONIA in the last 168 hours. CBC: Recent Labs  Lab 08/06/19 1652 08/06/19 1652 08/07/19 0740 08/07/19 0740 08/07/19 1631 08/08/19 0815 08/08/19 1317 08/08/19 2108 08/09/19 0459  WBC 8.0  --  6.8  --   --  5.5  --   --  6.8  NEUTROABS 5.2  --   --   --   --   --   --   --   --   HGB 5.8*   < > 9.4*   < >  9.7* 8.9* 9.4* 8.7* 7.9*  HCT 20.0*   < > 30.1*   < > 30.6* 28.5* 30.2* 27.9* 26.4*  MCV 87.7  --  92.0  --   --  91.1  --   --  92.6  PLT 241  --  222  --   --  207  --   --  203   < > = values in this interval not displayed.   Cardiac Enzymes: No results for input(s): CKTOTAL, CKMB, CKMBINDEX, TROPONINI in the last 168 hours. BNP: Invalid input(s): POCBNP CBG: No results for input(s): GLUCAP in the last 168 hours. D-Dimer No results for input(s): DDIMER in the last 72 hours. Hgb A1c No results for input(s): HGBA1C in the last 72 hours. Lipid Profile No results for input(s): CHOL, HDL, LDLCALC, TRIG, CHOLHDL, LDLDIRECT in the last 72 hours. Thyroid function studies No results for input(s): TSH, T4TOTAL, T3FREE, THYROIDAB in the last 72 hours.  Invalid input(s): FREET3 Anemia work up No results for input(s): VITAMINB12, FOLATE, FERRITIN, TIBC, IRON, RETICCTPCT in the last 72 hours. Urinalysis    Component Value Date/Time   COLORURINE STRAW (A) 07/15/2019 1156   APPEARANCEUR CLEAR 07/15/2019 1156   LABSPEC 1.006 07/15/2019 1156   PHURINE 7.0 07/15/2019 1156   GLUCOSEU NEGATIVE 07/15/2019 1156   HGBUR NEGATIVE 07/15/2019 1156   HGBUR negative 06/07/2009 0925   BILIRUBINUR NEGATIVE 07/15/2019 1156   KETONESUR NEGATIVE 07/15/2019 1156   PROTEINUR 30 (A) 07/15/2019 1156   UROBILINOGEN 0.2 06/07/2009 0925   NITRITE NEGATIVE 07/15/2019 1156   LEUKOCYTESUR SMALL (A) 07/15/2019 1156   Sepsis Labs Invalid input(s): PROCALCITONIN,  WBC,  LACTICIDVEN Microbiology Recent Results (from the past 240 hour(s))  Respiratory Panel by RT PCR (Flu A&B, Covid) - Nasopharyngeal Swab     Status: None   Collection Time: 08/06/19  7:55 PM   Specimen: Nasopharyngeal Swab  Result Value Ref Range Status   SARS Coronavirus 2 by RT PCR NEGATIVE NEGATIVE Final  Comment: (NOTE) SARS-CoV-2 target nucleic acids are NOT DETECTED. The SARS-CoV-2 RNA is generally detectable in upper  respiratoy specimens during the acute phase of infection. The lowest concentration of SARS-CoV-2 viral copies this assay can detect is 131 copies/mL. A negative result does not preclude SARS-Cov-2 infection and should not be used as the sole basis for treatment or other patient management decisions. A negative result may occur with  improper specimen collection/handling, submission of specimen other than nasopharyngeal swab, presence of viral mutation(s) within the areas targeted by this assay, and inadequate number of viral copies (<131 copies/mL). A negative result must be combined with clinical observations, patient history, and epidemiological information. The expected result is Negative. Fact Sheet for Patients:  PinkCheek.be Fact Sheet for Healthcare Providers:  GravelBags.it This test is not yet ap proved or cleared by the [place] FDA and  has been authorized for detection and/or diagnosis of SARS-CoV-2 by FDA under an Emergency Use Authorization (EUA). This EUA will remain  in effect (meaning this test can be used) for the duration of the COVID-19 declaration under Section 564(b)(1) of the Act, 21 U.S.C. section 360bbb-3(b)(1), unless the authorization is terminated or revoked sooner.    Influenza A by PCR NEGATIVE NEGATIVE Final   Influenza B by PCR NEGATIVE NEGATIVE Final    Comment: (NOTE) The Xpert Xpress SARS-CoV-2/FLU/RSV assay is intended as an aid in  the diagnosis of influenza from Nasopharyngeal swab specimens and  should not be used as a sole basis for treatment. Nasal washings and  aspirates are unacceptable for Xpert Xpress SARS-CoV-2/FLU/RSV  testing. Fact Sheet for Patients: PinkCheek.be Fact Sheet for Healthcare Providers: GravelBags.it This test is not yet approved or cleared by the [place] FDA and  has been authorized for  detection and/or diagnosis of SARS-CoV-2 by  FDA under an Emergency Use Authorization (EUA). This EUA will remain  in effect (meaning this test can be used) for the duration of the  Covid-19 declaration under Section 564(b)(1) of the Act, 21  U.S.C. section 360bbb-3(b)(1), unless the authorization is  terminated or revoked. Performed at [facility], [place] [address] [place], [place] [address]      Time coordinating discharge:  39 minutes  SIGNED:   Georgette Shell, MD  [facility] 08/09/2019, 1:34 PM Pager   If 7PM-7AM, please contact night-coverage [email or web address] Password [email or web address]

## 2019-08-09 NOTE — Progress Notes (Signed)
Patient alert and oriented, VSS, Tele and IV d/c. D/c instruction explain and given to the patient all questions answered. Pt. D/c home per order

## 2019-08-10 LAB — BPAM RBC
Blood Product Expiration Date: 202105302359
Blood Product Expiration Date: 202106032359
Blood Product Expiration Date: 202106062359
Blood Product Expiration Date: 202106082359
ISSUE DATE / TIME: 202105062152
ISSUE DATE / TIME: 202105070113
Unit Type and Rh: 6200
Unit Type and Rh: 6200
Unit Type and Rh: 6200
Unit Type and Rh: 6200

## 2019-08-10 LAB — TYPE AND SCREEN
ABO/RH(D): A POS
Antibody Screen: NEGATIVE
Unit division: 0
Unit division: 0
Unit division: 0
Unit division: 0

## 2019-08-14 ENCOUNTER — Other Ambulatory Visit: Payer: Self-pay

## 2019-08-14 ENCOUNTER — Encounter: Payer: Self-pay | Admitting: Vascular Surgery

## 2019-08-14 ENCOUNTER — Ambulatory Visit (INDEPENDENT_AMBULATORY_CARE_PROVIDER_SITE_OTHER): Payer: Self-pay | Admitting: Vascular Surgery

## 2019-08-14 VITALS — BP 177/76 | HR 82 | Temp 98.0°F | Resp 20 | Ht 59.0 in | Wt 134.0 lb

## 2019-08-14 DIAGNOSIS — I6523 Occlusion and stenosis of bilateral carotid arteries: Secondary | ICD-10-CM

## 2019-08-14 NOTE — Progress Notes (Signed)
Patient ID: Kayla Mcclure, female   DOB: 06-10-1945, 74 y.o.   MRN: [id number]  Reason for Consult: Post-op Follow-up   Referred by Kayla Macadam, MD  Subjective:     HPI:  Kayla Mcclure is a 74 y.o. female was hospitalized for a GI bleed and was found to have left sided symptomatic carotid lesion.  This was treated with carotid endarterectomy for which she has done very well.  She is now here for follow-up and we have discussed right carotid endarterectomy as well.  She is again been admitted to the hospital for GI bleed was just discharged last week.  Overall now she is doing well she does not have any complaints related to today's visit.  Past Medical History:  Diagnosis Date  . Acute GI bleeding 08/07/2019  . Asthma  10/31/2007  . CAD (coronary artery disease) 08/05/2007   a) NSTEMI 2009: PCI to the RCA Promus DES 2.5 mm x 23 mm (3.0 mm). b) Myoview June 2017: Nonspecific ST changes. EF greater than 65%. LOW RISK, normal study. No ischemia or infarction.  . Carotid artery disease ([place])    a. 50-69% RICA by outpt dopp 04/22/13, f/u due 04/2014.  Marland Kitchen Chronic back pain   . Chronic diastolic heart failure ([place])   . CKD (chronic kidney disease), stage II   . Diverticulosis   04/11/2010    Colonoscopy  . Dyslipidemia, goal LDL below 70   . Essential hypertension    a. Normal renal arteries by PV angio 05/2013.  Marland Kitchen GERD (gastroesophageal reflux disease)   . GI bleed 12/2015  . History of recent blood transfusion 12/2015   12-19-15, 12-20-15 and 12-21-15  . Insomnia   . Internal hemorrhoids  04/11/2010   By colonoscopy  . Iron deficiency anemia   . Myocardial infarction ([place]) 2009  . Peripheral arterial disease ([place])    a. s/p PTA and stenting of L CIA stenosis 05/2013.  Marland Kitchen Renal insufficiency   . Tobacco abuse    Quit in 05/2013   Family History  Problem Relation Age of Onset  . Diabetes Mother   . Heart disease Mother   . Heart attack Mother 24  . Breast cancer  Mother 7  . Colon cancer Mother 11  . Heart attack Father 35  . Pneumonia Father   . Diabetes Sister   . Breast cancer Sister   . Breast cancer Sister 36   Past Surgical History:  Procedure Laterality Date  . CHOLECYSTECTOMY  1975  . COLONOSCOPY  04/11/2010  . colonscopy  11/30/2015  . CORONARY ANGIOPLASTY WITH STENT PLACEMENT  08/05/2007   Promus DES 2.5 mm x 23 mm (3 mm)  to RCA; EF 60-70%  . ENDARTERECTOMY Left 07/28/2019   Procedure: ENDARTERECTOMY CAROTID;  Surgeon: Waynetta Sandy, MD;  Location: [facility];  Service: Vascular;  Laterality: Left;  . ENTEROSCOPY N/A 12/27/2015   Procedure: ENTEROSCOPY;  Surgeon: Clarene Essex, MD;  Location: [facility];  Service: Endoscopy;  Laterality: N/A;  also needs slim egd scope  . ENTEROSCOPY N/A 05/15/2019   Procedure: ENTEROSCOPY;  Surgeon: Ronald Lobo, MD;  Location: [facility];  Service: Endoscopy;  Laterality: N/A;  Push enteroscopy using either ultraslim colonoscope or push enteroscope  . ENTEROSCOPY N/A 08/08/2019   Procedure: ENTEROSCOPY;  Surgeon: Ronnette Juniper, MD;  Location: [facility];  Service: Gastroenterology;  Laterality: N/A;  . ESOPHAGOGASTRODUODENOSCOPY N/A 09/13/2013   Procedure: ESOPHAGOGASTRODUODENOSCOPY (EGD);  Surgeon: Lear Ng, MD;  Location: [facility]COPY;  Service: Endoscopy;  Laterality: N/A;  . ESOPHAGOGASTRODUODENOSCOPY N/A 07/14/2015   Procedure: ESOPHAGOGASTRODUODENOSCOPY (EGD);  Surgeon: Clarene Essex, MD;  Location: [facility] ENDOSCOPY;  Service: Endoscopy;  Laterality: N/A;  . gi bleed  07/2015   blood given  . HOT HEMOSTASIS N/A 12/27/2015   Procedure: HOT HEMOSTASIS (ARGON PLASMA COAGULATION/BICAP);  Surgeon: Clarene Essex, MD;  Location: [facility];  Service: Endoscopy;  Laterality: N/A;  . HOT HEMOSTASIS N/A 08/08/2019   Procedure: HOT HEMOSTASIS (ARGON PLASMA COAGULATION/BICAP);  Surgeon: Ronnette Juniper, MD;  Location: [facility];  Service: Gastroenterology;  Laterality: N/A;  . ILIAC ARTERY STENT  Left 05/04/2013   L CIA-EIA -- Dr. Gwenlyn Found  . LEFT HEART CATH AND CORONARY ANGIOGRAPHY N/A 02/11/2019   Procedure: LEFT HEART CATH AND CORONARY ANGIOGRAPHY;  Surgeon: Leonie Man, MD;  Location: [facility] CV LAB;;-  Patent RCA stent with minimal ISR after pRCA 50%).  Small caliber RPAV with ~75%.  pLAD 30%, m-dLAD 40%, D2 40%.  Normal LVEDP and LVEF.  . LOWER EXTREMITY ANGIOGRAM Bilateral 05/04/2013   Procedure: LOWER EXTREMITY ANGIOGRAM;  Surgeon: Lorretta Harp, MD;  Location: [facility] CATH LAB;  Service: Cardiovascular;  Laterality: Bilateral;  . NM MYOVIEW LTD  09/07/2015   EF > 65%. Nonspecific ST changes but no ischemic changes. No ischemia or infarction. LOW RISK  . PATCH ANGIOPLASTY Left 07/28/2019   Procedure: PATCH ANGIOPLASTY USING Rueben Bash BIOLOGIC PATCH;  Surgeon: Waynetta Sandy, MD;  Location: No Name;  Service: Vascular;  Laterality: Left;  . TONSILLECTOMY  1960's  . TRANSTHORACIC ECHOCARDIOGRAM  09/21/2015   EF 60-65%. GR 1 DD. No regional wall motion analysis. No valve disease.  Marland Kitchen UPPER GI ENDOSCOPY  12/14/2015    Short Social History:  Social History   Tobacco Use  . Smoking status: Former Smoker    Types: Cigarettes    Quit date: 05/04/2013    Years since quitting: 6.2  . Smokeless tobacco: Never Used  Substance Use Topics  . Alcohol use: Yes    Comment: occasionally 3 times a year    No Known Allergies  Current Outpatient Medications  Medication Sig Dispense Refill  . albuterol (VENTOLIN HFA) 108 (90 Base) MCG/ACT inhaler Inhale 2 puffs into the lungs every 6 (six) hours as needed for wheezing or shortness of breath.    Marland Kitchen aspirin EC 81 MG tablet Take 81 mg by mouth daily.    Marland Kitchen atorvastatin (LIPITOR) 80 MG tablet Take 1 tablet (80 mg total) by mouth daily. 90 tablet 3  . clopidogrel (PLAVIX) 75 MG tablet Take 1 tablet (75 mg total) by mouth daily. 90 tablet 3  . ezetimibe (ZETIA) 10 MG tablet Take 1 tablet (10 mg total) by mouth daily. 90 tablet 3  .  ferrous gluconate (FERGON) 324 MG tablet Take 1 tablet (324 mg total) by mouth 2 (two) times daily with a meal. 60 tablet 3  . fluticasone furoate-vilanterol (BREO ELLIPTA) 100-25 MCG/INH AEPB Inhale 1 puff into the lungs daily.    . isosorbide mononitrate (IMDUR) 60 MG 24 hr tablet Take 1.5 tablets (90 mg total) by mouth daily. 135 tablet 3  . metoprolol succinate (TOPROL-XL) 100 MG 24 hr tablet Take 2 tablets (200 mg total) by mouth at bedtime. 180 tablet 3  . nitroGLYCERIN (NITROSTAT) 0.4 MG SL tablet Place 1 tablet (0.4 mg total) under the tongue every 5 (five) minutes as needed for chest pain. 25 tablet 6  . ondansetron (ZOFRAN) 4 MG tablet Take 1 tablet (4 mg  total) by mouth every 6 (six) hours as needed for nausea. 20 tablet 0  . oxyCODONE (OXY IR/ROXICODONE) 5 MG immediate release tablet Take 1 tablet (5 mg total) by mouth every 4 (four) hours as needed for moderate pain. 8 tablet 0  . pantoprazole (PROTONIX) 40 MG tablet Take 1 tablet (40 mg total) by mouth daily. 30 tablet 0   No current facility-administered medications for this visit.    Review of Systems  Constitutional:  Constitutional negative. HENT: HENT negative.  Eyes: Eyes negative.  Respiratory: Respiratory negative.  Cardiovascular: Cardiovascular negative.  GI: Positive for blood in stool.  Musculoskeletal: Musculoskeletal negative.  Neurological: Neurological negative. Hematologic: Positive for bruises/bleeds easily.  Psychiatric: Psychiatric negative.        Objective:  Objective   Vitals:   08/14/19 1058 08/14/19 1101  BP: (!) 184/90 (!) 177/76  Pulse: 82   Resp: 20   Temp: 98 F (36.7 C)   SpO2: 96%   Weight: 134 lb (60.8 kg)   Height: 4\' 11"  (1.499 m)    Body mass index is 27.06 kg/m.  Physical Exam Constitutional:      Appearance: Normal appearance.  HENT:     Head: Normocephalic.     Nose: Nose normal.  Eyes:     Pupils: Pupils are equal, round, and reactive to light.  Cardiovascular:      Rate and Rhythm: Normal rate.  Pulmonary:     Effort: Pulmonary effort is normal.  Abdominal:     General: Abdomen is flat.     Palpations: Abdomen is soft.  Musculoskeletal:        General: No swelling. Normal range of motion.     Cervical back: Neck supple. Tenderness present.  Skin:    General: Skin is dry.     Capillary Refill: Capillary refill takes less than 2 seconds.  Neurological:     General: No focal deficit present.     Mental Status: She is alert.  Psychiatric:        Mood and Affect: Mood normal.        Thought Content: Thought content normal.        Judgment: Judgment normal.     Data: CT IMPRESSION: Continued normal appearance of the brain itself. 1 cm right frontal convexity meningioma without significant mass-effect upon the brain.  Severe atherosclerotic disease at both carotid bifurcation and ICA bulb regions. Serial stenoses of the right ICA bulb at 80% or greater. Long segment stenosis of the left ICA bulb measuring 60-65% by CT.  Atherosclerotic disease in both carotid siphon regions. No stenosis greater than 30% on the left. 30-50% stenosis on the right.  50% stenosis of the proximal left subclavian artery. 50% stenosis at the left vertebral artery origin, with wide patency beyond.     Assessment/Plan:     74 year old female status post left carotid endarterectomy for symptomatic disease.  She has high-grade stenosis of the right carotid as well.  She was recently admitted with GI bleed but has recovered well.  She is now indicated for right carotid endarterectomy and will get this scheduled in the near future.  We again discussed the risk benefits alternatives and she demonstrates good understanding.     Waynetta Sandy MD [facility]

## 2019-08-18 ENCOUNTER — Ambulatory Visit (HOSPITAL_COMMUNITY)
Admission: RE | Admit: 2019-08-18 | Payer: Medicare Other | Source: Ambulatory Visit | Attending: Cardiovascular Disease | Admitting: Cardiovascular Disease

## 2019-08-24 NOTE — Pre-Procedure Instructions (Addendum)
Your procedure is scheduled on Thursday, May 27th, 2021 from 07:30 AM- 09:53 AM.  Report to [facility] Main Entrance "A" at 05:30 A.M., and check in at the Admitting office.  Call this number if you have problems the morning of surgery:  [phone]  Call [phone] if you have any questions prior to your surgery date Monday-Friday 8am-4pm.    Remember:  Do not eat or drink after midnight the night before your surgery.    Take these medicines the morning of surgery with A SIP OF WATER: atorvastatin (LIPITOR) ezetimibe (ZETIA) isosorbide mononitrate (IMDUR) pantoprazole (PROTONIX) fluticasone furoate-vilanterol (BREO ELLIPTA) inhaler   IF NEEDED: nitroGLYCERIN (NITROSTAT) ondansetron (ZOFRAN) oxyCODONE (OXY IR/ROXICODONE) albuterol (VENTOLIN HFA) 108 (90 Base) inhaler  *Please bring all inhalers with you the day of surgery.    >>Follow your surgeon's instructions on when to stop clopidogrel (PLAVIX) and Aspirin.  If no instructions were given by your surgeon then you will need to call the office to get those instructions. <<   As of today, STOP taking any Aspirin containing products, Aleve, Naproxen, Ibuprofen, Motrin, Advil, Goody's, BC's, all herbal medications, fish oil, and all vitamins.                      Do not wear jewelry, make up, or nail polish.            Do not wear lotions, powders, perfumes, or deodorant.            Do not shave 48 hours prior to surgery.              Do not bring valuables to the hospital.            [facility] is not responsible for any belongings or valuables.  Do NOT Smoke (Tobacco/Vapping) or drink Alcohol 24 hours prior to your procedure.  If you use a CPAP at night, you may bring all equipment for your overnight stay.   Contacts, glasses, dentures or bridgework may not be worn into surgery.      For patients admitted to the hospital, discharge time will be determined by your treatment team.   Patients discharged the day of  surgery will not be allowed to drive home, and someone needs to stay with them for 24 hours.    Special instructions:   [facility]- Preparing For Surgery  Before surgery, you can play an important role. Because skin is not sterile, your skin needs to be as free of germs as possible. You can reduce the number of germs on your skin by washing with CHG (chlorahexidine gluconate) Soap before surgery.  CHG is an antiseptic cleaner which kills germs and bonds with the skin to continue killing germs even after washing.    Oral Hygiene is also important to reduce your risk of infection.  Remember - BRUSH YOUR TEETH THE MORNING OF SURGERY WITH YOUR REGULAR TOOTHPASTE  Please do not use if you have an allergy to CHG or antibacterial soaps. If your skin becomes reddened/irritated stop using the CHG.  Do not shave (including legs and underarms) for at least 48 hours prior to first CHG shower. It is OK to shave your face.  Please follow these instructions carefully.   1. Shower the NIGHT BEFORE SURGERY and the MORNING OF SURGERY with CHG Soap.   2. If you chose to wash your hair, wash your hair first as usual with your normal shampoo.  3. After you shampoo,  rinse your hair and body thoroughly to remove the shampoo.  4. Use CHG as you would any other liquid soap. You can apply CHG directly to the skin and wash gently with a scrungie or a clean washcloth.   5. Apply the CHG Soap to your body ONLY FROM THE NECK DOWN.  Do not use on open wounds or open sores. Avoid contact with your eyes, ears, mouth and genitals (private parts). Wash Face and genitals (private parts)  with your normal soap.   6. Wash thoroughly, paying special attention to the area where your surgery will be performed.  7. Thoroughly rinse your body with warm water from the neck down.  8. DO NOT shower/wash with your normal soap after using and rinsing off the CHG Soap.  9. Pat yourself dry with a CLEAN TOWEL.  10. Wear CLEAN  PAJAMAS to bed the night before surgery, wear comfortable clothes the morning of surgery  11. Place CLEAN SHEETS on your bed the night of your first shower and DO NOT SLEEP WITH PETS.   Day of Surgery:   Do not apply any deodorants/lotions.  Please wear clean clothes to the hospital/surgery center.   Remember to brush your teeth WITH YOUR REGULAR TOOTHPASTE.   Please read over the following fact sheets that you were given.

## 2019-08-25 ENCOUNTER — Other Ambulatory Visit: Payer: Self-pay

## 2019-08-25 ENCOUNTER — Encounter (HOSPITAL_COMMUNITY)
Admission: RE | Admit: 2019-08-25 | Discharge: 2019-08-25 | Disposition: A | Discharge status: Home / Self Care | Payer: Medicare Other | Source: Ambulatory Visit | Attending: Vascular Surgery | Admitting: Vascular Surgery

## 2019-08-25 ENCOUNTER — Encounter (HOSPITAL_COMMUNITY): Payer: Self-pay

## 2019-08-25 DIAGNOSIS — I130 Hypertensive heart and chronic kidney disease with heart failure and stage 1 through stage 4 chronic kidney disease, or unspecified chronic kidney disease: Secondary | ICD-10-CM | POA: Insufficient documentation

## 2019-08-25 DIAGNOSIS — I252 Old myocardial infarction: Secondary | ICD-10-CM | POA: Insufficient documentation

## 2019-08-25 DIAGNOSIS — Z8673 Personal history of transient ischemic attack (TIA), and cerebral infarction without residual deficits: Secondary | ICD-10-CM | POA: Insufficient documentation

## 2019-08-25 DIAGNOSIS — Z01818 Encounter for other preprocedural examination: Secondary | ICD-10-CM | POA: Insufficient documentation

## 2019-08-25 DIAGNOSIS — K219 Gastro-esophageal reflux disease without esophagitis: Secondary | ICD-10-CM | POA: Insufficient documentation

## 2019-08-25 DIAGNOSIS — I5032 Chronic diastolic (congestive) heart failure: Secondary | ICD-10-CM | POA: Insufficient documentation

## 2019-08-25 DIAGNOSIS — Z7902 Long term (current) use of antithrombotics/antiplatelets: Secondary | ICD-10-CM | POA: Insufficient documentation

## 2019-08-25 DIAGNOSIS — Z7982 Long term (current) use of aspirin: Secondary | ICD-10-CM | POA: Insufficient documentation

## 2019-08-25 DIAGNOSIS — Z79899 Other long term (current) drug therapy: Secondary | ICD-10-CM | POA: Insufficient documentation

## 2019-08-25 DIAGNOSIS — J45909 Unspecified asthma, uncomplicated: Secondary | ICD-10-CM | POA: Insufficient documentation

## 2019-08-25 DIAGNOSIS — D509 Iron deficiency anemia, unspecified: Secondary | ICD-10-CM | POA: Insufficient documentation

## 2019-08-25 DIAGNOSIS — Z955 Presence of coronary angioplasty implant and graft: Secondary | ICD-10-CM | POA: Insufficient documentation

## 2019-08-25 DIAGNOSIS — I6529 Occlusion and stenosis of unspecified carotid artery: Secondary | ICD-10-CM | POA: Insufficient documentation

## 2019-08-25 DIAGNOSIS — Z7951 Long term (current) use of inhaled steroids: Secondary | ICD-10-CM | POA: Insufficient documentation

## 2019-08-25 DIAGNOSIS — N183 Chronic kidney disease, stage 3 unspecified: Secondary | ICD-10-CM | POA: Insufficient documentation

## 2019-08-25 DIAGNOSIS — I2510 Atherosclerotic heart disease of native coronary artery without angina pectoris: Secondary | ICD-10-CM | POA: Insufficient documentation

## 2019-08-25 HISTORY — DX: Cerebral infarction, unspecified: I63.9

## 2019-08-25 LAB — COMPREHENSIVE METABOLIC PANEL
ALT: 20 U/L (ref 0–44)
AST: 32 U/L (ref 15–41)
Albumin: 3.3 g/dL — ABNORMAL LOW (ref 3.5–5.0)
Alkaline Phosphatase: 58 U/L (ref 38–126)
Anion gap: 7 (ref 5–15)
BUN: 18 mg/dL (ref 8–23)
CO2: 23 mmol/L (ref 22–32)
Calcium: 8.5 mg/dL — ABNORMAL LOW (ref 8.9–10.3)
Chloride: 110 mmol/L (ref 98–111)
Creatinine, Ser: 1.2 mg/dL — ABNORMAL HIGH (ref 0.44–1.00)
GFR calc Af Amer: 52 mL/min — ABNORMAL LOW (ref >60)
GFR calc non Af Amer: 45 mL/min — ABNORMAL LOW (ref >60)
Glucose, Bld: 98 mg/dL (ref 70–99)
Potassium: 3.9 mmol/L (ref 3.5–5.1)
Sodium: 140 mmol/L (ref 135–145)
Total Bilirubin: 0.6 mg/dL (ref 0.3–1.2)
Total Protein: 6.8 g/dL (ref 6.5–8.1)

## 2019-08-25 LAB — URINALYSIS, ROUTINE W REFLEX MICROSCOPIC
Bilirubin Urine: NEGATIVE
Glucose, UA: NEGATIVE mg/dL
Hgb urine dipstick: NEGATIVE
Ketones, ur: NEGATIVE mg/dL
Leukocytes,Ua: NEGATIVE
Nitrite: NEGATIVE
Protein, ur: 30 mg/dL — AB
Specific Gravity, Urine: 1.008 (ref 1.005–1.030)
pH: 6 (ref 5.0–8.0)

## 2019-08-25 LAB — CBC
HCT: 27.4 % — ABNORMAL LOW (ref 36.0–46.0)
Hemoglobin: 8 g/dL — ABNORMAL LOW (ref 12.0–15.0)
MCH: 27.8 pg (ref 26.0–34.0)
MCHC: 29.2 g/dL — ABNORMAL LOW (ref 30.0–36.0)
MCV: 95.1 fL (ref 80.0–100.0)
Platelets: 222 10*3/uL (ref 150–400)
RBC: 2.88 MIL/uL — ABNORMAL LOW (ref 3.87–5.11)
RDW: 17.4 % — ABNORMAL HIGH (ref 11.5–15.5)
WBC: 6.9 10*3/uL (ref 4.0–10.5)
nRBC: 0 % (ref 0.0–0.2)

## 2019-08-25 LAB — SURGICAL PCR SCREEN
MRSA, PCR: NEGATIVE
Staphylococcus aureus: NEGATIVE

## 2019-08-25 LAB — PROTIME-INR
INR: 1.1 (ref 0.8–1.2)
Prothrombin Time: 13.4 seconds (ref 11.4–15.2)

## 2019-08-25 LAB — APTT: aPTT: 31 seconds (ref 24–36)

## 2019-08-25 NOTE — Progress Notes (Signed)
Abnormal U/A and CBC called in to [facility] at Dr. Claretha Cooper office.

## 2019-08-25 NOTE — Progress Notes (Signed)
PCP - Marilynne Drivers, PA-C; [name], MD Cardiologist - [name], MD  PPM/ICD - Denies  Chest x-ray - N/A EKG - 08/07/19 Stress Test - 09/07/15 ECHO - 05/14/19 Cardiac Cath - 02/11/19  Sleep Study - No CPAP - N/A  Patient denies being a diabetic.  Blood Thinner Instructions: Per patient, stop Plavix 3 days prior to sx. Aspirin Instructions: Per patient, stop 3 days prior to sx.  ERAS Protcol - No PRE-SURGERY Ensure or G2- N/A  COVID TEST- 08/26/19 @ 0900 @ [facility]   Anesthesia review: Yes, cardiac hx.; recent hospitalization for GI bleed 08/2019.  Patient denies shortness of breath, fever, cough and chest pain at PAT appointment   All instructions explained to the patient, with a verbal understanding of the material. Patient agrees to go over the instructions while at home for a better understanding. Patient also instructed to self quarantine after being tested for COVID-19. The opportunity to ask questions was provided.

## 2019-08-26 ENCOUNTER — Other Ambulatory Visit (HOSPITAL_COMMUNITY)
Admission: RE | Admit: 2019-08-26 | Discharge: 2019-08-26 | Disposition: A | Discharge status: Home / Self Care | Payer: Medicare Other | Source: Ambulatory Visit | Attending: Vascular Surgery | Admitting: Vascular Surgery

## 2019-08-26 LAB — SARS CORONAVIRUS 2 (TAT 6-24 HRS): SARS Coronavirus 2: NEGATIVE

## 2019-08-26 NOTE — Anesthesia Preprocedure Evaluation (Addendum)
Anesthesia Evaluation  Patient identified by MRN, date of birth, ID band Patient awake    Reviewed: Allergy & Precautions, NPO status , Patient's Chart, lab work & pertinent test results  Airway Mallampati: I  TM Distance: >3 FB Neck ROM: Full    Dental   Pulmonary former smoker,    Pulmonary exam normal        Cardiovascular hypertension, Pt. on medications + CAD, + Past MI and + Cardiac Stents  Normal cardiovascular exam     Neuro/Psych CVA    GI/Hepatic GERD  Medicated and Controlled,  Endo/Other    Renal/GU Renal InsufficiencyRenal disease     Musculoskeletal   Abdominal   Peds  Hematology   Anesthesia Other Findings   Reproductive/Obstetrics                            Anesthesia Physical Anesthesia Plan  ASA: III  Anesthesia Plan: General   Post-op Pain Management:    Induction: Intravenous  PONV Risk Score and Plan: 3 and Ondansetron, Dexamethasone and Midazolam  Airway Management Planned: Oral ETT  Additional Equipment: Arterial line  Intra-op Plan:   Post-operative Plan: Extubation in OR  Informed Consent: I have reviewed the patients History and Physical, chart, labs and discussed the procedure including the risks, benefits and alternatives for the proposed anesthesia with the patient or authorized representative who has indicated his/her understanding and acceptance.       Plan Discussed with: CRNA and Surgeon  Anesthesia Plan Comments: (See APP note by Durel Salts, FNP. )       Anesthesia Quick Evaluation

## 2019-08-26 NOTE — Progress Notes (Signed)
Anesthesia Chart Review:   Case: [id number] Date/Time: 08/27/19 0715   Procedure: ENDARTERECTOMY CAROTID (Right )   Anesthesia type: General   Pre-op diagnosis: CAROTID ARTERY STENOSIS   Location: [place] OR ROOM 12 / [place] [facility]   Surgeons: Waynetta Sandy, MD      DISCUSSION:  - Pt is a 74 year old with hx CAD (s/p DES to RCA 2009), chronic diastolic HF, stroke,  HTN, carotid artery disease (s/p L CEA 07/28/19), PAD (s/p PTA and stenting of L CIA 2015), asthma, IDA, CKD (stage III), hx small bowel AVMs (tx with APC 2017)  - Hospitalized 5/6-08/09/19 for symptomatic anemia, GI bleeding. S/p 2 units PRBCs. Hgb was 7.9 at discharge  - Hospitalized 2/8-2/13/21 for syncope, symptomatic anemia, GI bleeding (no source of bleeding found). Complicated by stuttering/word blocking/paresthesias- MRI negative for stroke but MRA showed B carotid stenosis. Outpatient neuro f/u referred pt to vascular surgery for carotid stenosis  - H/H 8.0/27.4. This is consistent with results at hospital discharge 08/09/19  - Pt to hold plavix 5 days before surgery.    VS: BP (!) 150/64   Pulse 68   Temp 36.9 C (Oral)   Resp 18   Wt 61.6 kg   SpO2 100%   BMI 27.43 kg/m    PROVIDERS: - PCP [name], MD - Cardiologist is [name], MD. Last office visit 05/27/19 with Jory Sims, DNP - PV cardiologist is Quay Burow, MD - GI is Clarene Essex, MD  LABS:  - H/H 8.0/27.4, consistent with recent results.    (all labs ordered are listed, but only abnormal results are displayed)  Labs Reviewed  CBC - Abnormal; Notable for the following components:      Result Value   RBC 2.88 (*)    Hemoglobin 8.0 (*)    HCT 27.4 (*)    MCHC 29.2 (*)    RDW 17.4 (*)    All other components within normal limits  COMPREHENSIVE METABOLIC PANEL - Abnormal; Notable for the following components:   Creatinine, Ser 1.20 (*)    Calcium 8.5 (*)    Albumin 3.3 (*)    GFR calc non Af Amer 45 (*)    GFR calc Af Amer 52  (*)    All other components within normal limits  URINALYSIS, ROUTINE W REFLEX MICROSCOPIC - Abnormal; Notable for the following components:   Color, Urine STRAW (*)    Protein, ur 30 (*)    Bacteria, UA RARE (*)    All other components within normal limits  SURGICAL PCR SCREEN  APTT  PROTIME-INR  TYPE AND SCREEN    EKG 08/06/19: NSR with sinus arrhythmia   CV:  Echo 05/14/19:  1. Left ventricular ejection fraction, by estimation, is 60 to 65%. The left ventricle has normal function. The left ventrical has no regional wall motion abnormalities. Left ventricular diastolic parameters are consistent with Grade II diastolic dysfunction (pseudonormalization).  2. Right ventricular systolic function is normal. The right ventricular size is normal. There is moderately elevated pulmonary artery systolic pressure.  3. The mitral valve is normal in structure and function. trivial mitral valve regurgitation. No evidence of mitral stenosis.  4. The aortic valve is normal in structure and function. Aortic valve regurgitation is trivial . No aortic stenosis is present.    Cardiac cath 02/11/19:   Patent RCA stent with minimal ISR  Moderate pRCA lesion & 75% ostial RPAV (very small caliber vessel) - could be potential culprits - recommend medical  management.  Normal LVEF & EDP.    Past Medical History:  Diagnosis Date  . Acute GI bleeding 08/07/2019  . Asthma  10/31/2007  . CAD (coronary artery disease) 08/05/2007   a) NSTEMI 2009: PCI to the RCA Promus DES 2.5 mm x 23 mm (3.0 mm). b) Myoview June 2017: Nonspecific ST changes. EF greater than 65%. LOW RISK, normal study. No ischemia or infarction.  . Carotid artery disease ([place])    a. 50-69% RICA by outpt dopp 04/22/13, f/u due 04/2014.  Marland Kitchen Chronic back pain   . Chronic diastolic heart failure ([place])   . CKD (chronic kidney disease), stage II   . Diverticulosis   04/11/2010    Colonoscopy  . Dyslipidemia, goal LDL below 70   . Essential  hypertension    a. Normal renal arteries by PV angio 05/2013.  Marland Kitchen GERD (gastroesophageal reflux disease)   . GI bleed 12/2015  . History of kidney stones 1975  . History of recent blood transfusion 12/2015   12-19-15, 12-20-15 and 12-21-15  . Insomnia   . Internal hemorrhoids  04/11/2010   By colonoscopy  . Iron deficiency anemia   . Myocardial infarction ([place]) 2009  . Peripheral arterial disease ([place])    a. s/p PTA and stenting of L CIA stenosis 05/2013.  Marland Kitchen Renal insufficiency   . Stroke [facility])    mini stroke  . Tobacco abuse    Quit in 05/2013    Past Surgical History:  Procedure Laterality Date  . CHOLECYSTECTOMY  1975  . COLONOSCOPY  04/11/2010  . colonscopy  11/30/2015  . CORONARY ANGIOPLASTY WITH STENT PLACEMENT  08/05/2007   Promus DES 2.5 mm x 23 mm (3 mm)  to RCA; EF 60-70%  . ENDARTERECTOMY Left 07/28/2019   Procedure: ENDARTERECTOMY CAROTID;  Surgeon: Waynetta Sandy, MD;  Location: [place];  Service: Vascular;  Laterality: Left;  . ENTEROSCOPY N/A 12/27/2015   Procedure: ENTEROSCOPY;  Surgeon: Clarene Essex, MD;  Location: [facility] ENDOSCOPY;  Service: Endoscopy;  Laterality: N/A;  also needs slim egd scope  . ENTEROSCOPY N/A 05/15/2019   Procedure: ENTEROSCOPY;  Surgeon: Ronald Lobo, MD;  Location: [place];  Service: Endoscopy;  Laterality: N/A;  Push enteroscopy using either ultraslim colonoscope or push enteroscope  . ENTEROSCOPY N/A 08/08/2019   Procedure: ENTEROSCOPY;  Surgeon: Ronnette Juniper, MD;  Location: [place];  Service: Gastroenterology;  Laterality: N/A;  . ESOPHAGOGASTRODUODENOSCOPY N/A 09/13/2013   Procedure: ESOPHAGOGASTRODUODENOSCOPY (EGD);  Surgeon: Lear Ng, MD;  Location: [facility]COPY;  Service: Endoscopy;  Laterality: N/A;  . ESOPHAGOGASTRODUODENOSCOPY N/A 07/14/2015   Procedure: ESOPHAGOGASTRODUODENOSCOPY (EGD);  Surgeon: Clarene Essex, MD;  Location: [facility] ENDOSCOPY;  Service: Endoscopy;  Laterality: N/A;  . gi bleed  07/2015   blood given   . HOT HEMOSTASIS N/A 12/27/2015   Procedure: HOT HEMOSTASIS (ARGON PLASMA COAGULATION/BICAP);  Surgeon: Clarene Essex, MD;  Location: [facility];  Service: Endoscopy;  Laterality: N/A;  . HOT HEMOSTASIS N/A 08/08/2019   Procedure: HOT HEMOSTASIS (ARGON PLASMA COAGULATION/BICAP);  Surgeon: Ronnette Juniper, MD;  Location: [place];  Service: Gastroenterology;  Laterality: N/A;  . ILIAC ARTERY STENT Left 05/04/2013   L CIA-EIA -- Dr. Gwenlyn Found  . LEFT HEART CATH AND CORONARY ANGIOGRAPHY N/A 02/11/2019   Procedure: LEFT HEART CATH AND CORONARY ANGIOGRAPHY;  Surgeon: Leonie Man, MD;  Location: [place] CV LAB;;-  Patent RCA stent with minimal ISR after pRCA 50%).  Small caliber RPAV with ~75%.  pLAD 30%, m-dLAD 40%, D2 40%.  Normal LVEDP and LVEF.  . LOWER EXTREMITY ANGIOGRAM Bilateral 05/04/2013   Procedure: LOWER EXTREMITY ANGIOGRAM;  Surgeon: Lorretta Harp, MD;  Location: [facility] CATH LAB;  Service: Cardiovascular;  Laterality: Bilateral;  . NM MYOVIEW LTD  09/07/2015   EF > 65%. Nonspecific ST changes but no ischemic changes. No ischemia or infarction. LOW RISK  . PATCH ANGIOPLASTY Left 07/28/2019   Procedure: PATCH ANGIOPLASTY USING Rueben Bash BIOLOGIC PATCH;  Surgeon: Waynetta Sandy, MD;  Location: [place];  Service: Vascular;  Laterality: Left;  . TONSILLECTOMY  1960's  . TRANSTHORACIC ECHOCARDIOGRAM  09/21/2015   EF 60-65%. GR 1 DD. No regional wall motion analysis. No valve disease.  Marland Kitchen UPPER GI ENDOSCOPY  12/14/2015    MEDICATIONS: . albuterol (VENTOLIN HFA) 108 (90 Base) MCG/ACT inhaler  . aspirin EC 81 MG tablet  . atorvastatin (LIPITOR) 80 MG tablet  . clopidogrel (PLAVIX) 75 MG tablet  . ezetimibe (ZETIA) 10 MG tablet  . ferrous gluconate (FERGON) 324 MG tablet  . fluticasone furoate-vilanterol (BREO ELLIPTA) 100-25 MCG/INH AEPB  . isosorbide mononitrate (IMDUR) 60 MG 24 hr tablet  . metoprolol succinate (TOPROL-XL) 100 MG 24 hr tablet  . nitroGLYCERIN (NITROSTAT) 0.4 MG SL  tablet  . ondansetron (ZOFRAN) 4 MG tablet  . oxyCODONE (OXY IR/ROXICODONE) 5 MG immediate release tablet  . pantoprazole (PROTONIX) 40 MG tablet   No current facility-administered medications for this encounter.   - Pt to hold plavix 5 days before surgery.    If no changes, I anticipate pt can proceed with surgery as scheduled.   Willeen Cass, FNP-BC [facility]/Anesthesiology Phone: [phone] 08/26/2019 9:48 AM

## 2019-08-27 ENCOUNTER — Inpatient Hospital Stay (HOSPITAL_COMMUNITY): Payer: Medicare Other | Admitting: Emergency Medicine

## 2019-08-27 ENCOUNTER — Encounter (HOSPITAL_COMMUNITY): Payer: Self-pay | Admitting: Vascular Surgery

## 2019-08-27 ENCOUNTER — Inpatient Hospital Stay (HOSPITAL_COMMUNITY)
Admission: RE | Admit: 2019-08-27 | Discharge: 2019-08-29 | DRG: 038 | Disposition: A | Discharge status: Home / Self Care | Payer: Medicare Other | Attending: Vascular Surgery | Admitting: Vascular Surgery

## 2019-08-27 ENCOUNTER — Inpatient Hospital Stay (HOSPITAL_COMMUNITY): Payer: Medicare Other | Admitting: Certified Registered"

## 2019-08-27 ENCOUNTER — Other Ambulatory Visit: Payer: Self-pay

## 2019-08-27 ENCOUNTER — Encounter (HOSPITAL_COMMUNITY)
Admission: RE | Disposition: A | Discharge status: Home / Self Care | Payer: Self-pay | Source: Home / Self Care | Attending: Vascular Surgery

## 2019-08-27 DIAGNOSIS — I130 Hypertensive heart and chronic kidney disease with heart failure and stage 1 through stage 4 chronic kidney disease, or unspecified chronic kidney disease: Secondary | ICD-10-CM | POA: Diagnosis present

## 2019-08-27 DIAGNOSIS — I5032 Chronic diastolic (congestive) heart failure: Secondary | ICD-10-CM | POA: Diagnosis present

## 2019-08-27 DIAGNOSIS — I25118 Atherosclerotic heart disease of native coronary artery with other forms of angina pectoris: Secondary | ICD-10-CM | POA: Diagnosis present

## 2019-08-27 DIAGNOSIS — D62 Acute posthemorrhagic anemia: Secondary | ICD-10-CM | POA: Diagnosis not present

## 2019-08-27 DIAGNOSIS — Z6830 Body mass index (BMI) 30.0-30.9, adult: Secondary | ICD-10-CM | POA: Diagnosis not present

## 2019-08-27 DIAGNOSIS — I6529 Occlusion and stenosis of unspecified carotid artery: Secondary | ICD-10-CM | POA: Diagnosis present

## 2019-08-27 DIAGNOSIS — G4700 Insomnia, unspecified: Secondary | ICD-10-CM | POA: Diagnosis present

## 2019-08-27 DIAGNOSIS — Z20822 Contact with and (suspected) exposure to covid-19: Secondary | ICD-10-CM | POA: Diagnosis present

## 2019-08-27 DIAGNOSIS — E669 Obesity, unspecified: Secondary | ICD-10-CM | POA: Diagnosis present

## 2019-08-27 DIAGNOSIS — I6521 Occlusion and stenosis of right carotid artery: Secondary | ICD-10-CM | POA: Diagnosis present

## 2019-08-27 DIAGNOSIS — Z87891 Personal history of nicotine dependence: Secondary | ICD-10-CM | POA: Diagnosis not present

## 2019-08-27 DIAGNOSIS — I252 Old myocardial infarction: Secondary | ICD-10-CM | POA: Diagnosis not present

## 2019-08-27 DIAGNOSIS — Z955 Presence of coronary angioplasty implant and graft: Secondary | ICD-10-CM | POA: Diagnosis not present

## 2019-08-27 DIAGNOSIS — Z7982 Long term (current) use of aspirin: Secondary | ICD-10-CM | POA: Diagnosis not present

## 2019-08-27 DIAGNOSIS — K219 Gastro-esophageal reflux disease without esophagitis: Secondary | ICD-10-CM | POA: Diagnosis present

## 2019-08-27 DIAGNOSIS — I739 Peripheral vascular disease, unspecified: Secondary | ICD-10-CM | POA: Diagnosis present

## 2019-08-27 DIAGNOSIS — Z8673 Personal history of transient ischemic attack (TIA), and cerebral infarction without residual deficits: Secondary | ICD-10-CM

## 2019-08-27 DIAGNOSIS — E785 Hyperlipidemia, unspecified: Secondary | ICD-10-CM | POA: Diagnosis present

## 2019-08-27 DIAGNOSIS — N183 Chronic kidney disease, stage 3 unspecified: Secondary | ICD-10-CM | POA: Diagnosis present

## 2019-08-27 DIAGNOSIS — Z7902 Long term (current) use of antithrombotics/antiplatelets: Secondary | ICD-10-CM

## 2019-08-27 HISTORY — PX: ENDARTERECTOMY: SHX5162

## 2019-08-27 LAB — POCT ACTIVATED CLOTTING TIME: Activated Clotting Time: 246 seconds

## 2019-08-27 SURGERY — ENDARTERECTOMY, CAROTID
Anesthesia: General | Site: Neck | Laterality: Right

## 2019-08-27 MED ORDER — CHLORHEXIDINE GLUCONATE 0.12 % MT SOLN: 15.0000 mL | Freq: Once | OROMUCOSAL | Status: AC

## 2019-08-27 MED ORDER — BISACODYL 10 MG RE SUPP: 10.0000 mg | Freq: Every day | RECTAL | Status: DC | PRN

## 2019-08-27 MED ORDER — ATORVASTATIN CALCIUM 80 MG PO TABS
80.0000 mg | ORAL_TABLET | Freq: Every day | ORAL | Status: DC
  Administered 2019-08-28 – 2019-08-29 (×2): 80 mg via ORAL
  Filled 2019-08-27 (×2): qty 1

## 2019-08-27 MED ORDER — SUGAMMADEX SODIUM 200 MG/2ML IV SOLN
INTRAVENOUS | Status: DC | PRN
  Administered 2019-08-27: 200 mg via INTRAVENOUS

## 2019-08-27 MED ORDER — PHENYLEPHRINE HCL-NACL 10-0.9 MG/250ML-% IV SOLN
INTRAVENOUS | Status: DC | PRN
  Administered 2019-08-27: 60 ug/min via INTRAVENOUS

## 2019-08-27 MED ORDER — ASPIRIN EC 81 MG PO TBEC
81.0000 mg | DELAYED_RELEASE_TABLET | Freq: Every day | ORAL | Status: DC
  Administered 2019-08-28 – 2019-08-29 (×2): 81 mg via ORAL
  Filled 2019-08-27 (×2): qty 1

## 2019-08-27 MED ORDER — CHLORHEXIDINE GLUCONATE 0.12 % MT SOLN
OROMUCOSAL | Status: AC
  Administered 2019-08-27: 15 mL via OROMUCOSAL
  Filled 2019-08-27: qty 15

## 2019-08-27 MED ORDER — ACETAMINOPHEN 325 MG PO TABS: 325.0000 mg | ORAL_TABLET | ORAL | Status: DC | PRN

## 2019-08-27 MED ORDER — NITROGLYCERIN 0.4 MG SL SUBL: 0.4000 mg | SUBLINGUAL_TABLET | SUBLINGUAL | Status: DC | PRN

## 2019-08-27 MED ORDER — SODIUM CHLORIDE 0.9 % IV SOLN: INTRAVENOUS | Status: DC

## 2019-08-27 MED ORDER — HYDROMORPHONE HCL 1 MG/ML IJ SOLN: 0.5000 mg | INTRAMUSCULAR | Status: DC | PRN

## 2019-08-27 MED ORDER — CEFAZOLIN SODIUM-DEXTROSE 2-4 GM/100ML-% IV SOLN
2.0000 g | INTRAVENOUS | Status: AC
  Administered 2019-08-27: 2 g via INTRAVENOUS
  Filled 2019-08-27: qty 100

## 2019-08-27 MED ORDER — LACTATED RINGERS IV SOLN: INTRAVENOUS | Status: DC | PRN

## 2019-08-27 MED ORDER — HYDRALAZINE HCL 20 MG/ML IJ SOLN: 5.0000 mg | INTRAMUSCULAR | Status: DC | PRN

## 2019-08-27 MED ORDER — LIDOCAINE 2% (20 MG/ML) 5 ML SYRINGE
INTRAMUSCULAR | Status: AC
  Filled 2019-08-27: qty 5

## 2019-08-27 MED ORDER — SODIUM CHLORIDE 0.9 % IV SOLN
INTRAVENOUS | Status: AC
  Filled 2019-08-27: qty 1.2

## 2019-08-27 MED ORDER — MIDAZOLAM HCL 2 MG/2ML IJ SOLN
INTRAMUSCULAR | Status: AC
  Filled 2019-08-27: qty 2

## 2019-08-27 MED ORDER — METOPROLOL SUCCINATE ER 100 MG PO TB24
200.0000 mg | ORAL_TABLET | Freq: Every day | ORAL | Status: DC
  Administered 2019-08-28: 200 mg via ORAL
  Filled 2019-08-27: qty 2

## 2019-08-27 MED ORDER — FERROUS GLUCONATE 324 (38 FE) MG PO TABS
324.0000 mg | ORAL_TABLET | Freq: Two times a day (BID) | ORAL | Status: DC
  Administered 2019-08-27 – 2019-08-29 (×4): 324 mg via ORAL
  Filled 2019-08-27 (×5): qty 1

## 2019-08-27 MED ORDER — POLYETHYLENE GLYCOL 3350 17 G PO PACK: 17.0000 g | PACK | Freq: Every day | ORAL | Status: DC | PRN

## 2019-08-27 MED ORDER — SODIUM CHLORIDE 0.9 % IV SOLN
500.0000 mL | Freq: Once | INTRAVENOUS | Status: AC | PRN
  Administered 2019-08-27 (×2): 500 mL via INTRAVENOUS

## 2019-08-27 MED ORDER — ONDANSETRON HCL 4 MG/2ML IJ SOLN
INTRAMUSCULAR | Status: DC | PRN
  Administered 2019-08-27: 4 mg via INTRAVENOUS

## 2019-08-27 MED ORDER — EZETIMIBE 10 MG PO TABS
10.0000 mg | ORAL_TABLET | Freq: Every day | ORAL | Status: DC
  Administered 2019-08-28 – 2019-08-29 (×2): 10 mg via ORAL
  Filled 2019-08-27 (×2): qty 1

## 2019-08-27 MED ORDER — POTASSIUM CHLORIDE CRYS ER 20 MEQ PO TBCR: 20.0000 meq | EXTENDED_RELEASE_TABLET | Freq: Every day | ORAL | Status: DC | PRN

## 2019-08-27 MED ORDER — EPHEDRINE 5 MG/ML INJ
INTRAVENOUS | Status: AC
  Filled 2019-08-27: qty 10

## 2019-08-27 MED ORDER — GUAIFENESIN-DM 100-10 MG/5ML PO SYRP: 15.0000 mL | ORAL_SOLUTION | ORAL | Status: DC | PRN

## 2019-08-27 MED ORDER — PROTAMINE SULFATE 10 MG/ML IV SOLN
INTRAVENOUS | Status: DC | PRN
Start: 2019-08-27 — End: 2019-08-27
  Administered 2019-08-27: 10 mg via INTRAVENOUS
  Administered 2019-08-27 (×2): 20 mg via INTRAVENOUS

## 2019-08-27 MED ORDER — CLOPIDOGREL BISULFATE 75 MG PO TABS
75.0000 mg | ORAL_TABLET | Freq: Every day | ORAL | Status: DC
  Administered 2019-08-28 – 2019-08-29 (×2): 75 mg via ORAL
  Filled 2019-08-27 (×2): qty 1

## 2019-08-27 MED ORDER — ONDANSETRON HCL 4 MG/2ML IJ SOLN: 4.0000 mg | Freq: Four times a day (QID) | INTRAMUSCULAR | Status: DC | PRN

## 2019-08-27 MED ORDER — SODIUM CHLORIDE 0.9 % IV SOLN
INTRAVENOUS | Status: DC | PRN
  Administered 2019-08-27: 500 mL

## 2019-08-27 MED ORDER — PROPOFOL 10 MG/ML IV BOLUS
INTRAVENOUS | Status: AC
  Filled 2019-08-27: qty 20

## 2019-08-27 MED ORDER — CHLORHEXIDINE GLUCONATE CLOTH 2 % EX PADS: 6.0000 | MEDICATED_PAD | Freq: Once | CUTANEOUS | Status: DC

## 2019-08-27 MED ORDER — HEPARIN SODIUM (PORCINE) 1000 UNIT/ML IJ SOLN
INTRAMUSCULAR | Status: DC | PRN
  Administered 2019-08-27: 8000 [IU] via INTRAVENOUS

## 2019-08-27 MED ORDER — ORAL CARE MOUTH RINSE: 15.0000 mL | Freq: Once | OROMUCOSAL | Status: AC

## 2019-08-27 MED ORDER — ONDANSETRON HCL 4 MG PO TABS: 4.0000 mg | ORAL_TABLET | Freq: Four times a day (QID) | ORAL | Status: DC | PRN

## 2019-08-27 MED ORDER — ACETAMINOPHEN 650 MG RE SUPP: 325.0000 mg | RECTAL | Status: DC | PRN

## 2019-08-27 MED ORDER — PHENOL 1.4 % MT LIQD: 1.0000 | OROMUCOSAL | Status: DC | PRN

## 2019-08-27 MED ORDER — HYDROMORPHONE HCL 1 MG/ML IJ SOLN: 0.2500 mg | INTRAMUSCULAR | Status: DC | PRN

## 2019-08-27 MED ORDER — FENTANYL CITRATE (PF) 100 MCG/2ML IJ SOLN
INTRAMUSCULAR | Status: DC | PRN
  Administered 2019-08-27: 100 ug via INTRAVENOUS
  Administered 2019-08-27 (×2): 50 ug via INTRAVENOUS

## 2019-08-27 MED ORDER — LABETALOL HCL 5 MG/ML IV SOLN: 10.0000 mg | INTRAVENOUS | Status: DC | PRN

## 2019-08-27 MED ORDER — FLUTICASONE FUROATE-VILANTEROL 100-25 MCG/INH IN AEPB
1.0000 | INHALATION_SPRAY | Freq: Every day | RESPIRATORY_TRACT | Status: DC
  Administered 2019-08-28 – 2019-08-29 (×2): 1 via RESPIRATORY_TRACT
  Filled 2019-08-27: qty 28

## 2019-08-27 MED ORDER — HEPARIN SODIUM (PORCINE) 1000 UNIT/ML IJ SOLN
INTRAMUSCULAR | Status: AC
  Filled 2019-08-27: qty 1

## 2019-08-27 MED ORDER — PANTOPRAZOLE SODIUM 40 MG PO TBEC
40.0000 mg | DELAYED_RELEASE_TABLET | Freq: Every day | ORAL | Status: DC
  Administered 2019-08-28 – 2019-08-29 (×2): 40 mg via ORAL
  Filled 2019-08-27 (×2): qty 1

## 2019-08-27 MED ORDER — PROPOFOL 10 MG/ML IV BOLUS
INTRAVENOUS | Status: DC | PRN
  Administered 2019-08-27: 100 mg via INTRAVENOUS

## 2019-08-27 MED ORDER — EPHEDRINE SULFATE-NACL 50-0.9 MG/10ML-% IV SOSY
PREFILLED_SYRINGE | INTRAVENOUS | Status: DC | PRN
  Administered 2019-08-27 (×3): 5 mg via INTRAVENOUS

## 2019-08-27 MED ORDER — ONDANSETRON HCL 4 MG/2ML IJ SOLN: 4.0000 mg | Freq: Once | INTRAMUSCULAR | Status: DC | PRN

## 2019-08-27 MED ORDER — ONDANSETRON HCL 4 MG/2ML IJ SOLN
INTRAMUSCULAR | Status: AC
  Filled 2019-08-27: qty 2

## 2019-08-27 MED ORDER — DEXAMETHASONE SODIUM PHOSPHATE 10 MG/ML IJ SOLN
INTRAMUSCULAR | Status: AC
  Filled 2019-08-27: qty 1

## 2019-08-27 MED ORDER — DEXAMETHASONE SODIUM PHOSPHATE 10 MG/ML IJ SOLN
INTRAMUSCULAR | Status: DC | PRN
  Administered 2019-08-27: 8 mg via INTRAVENOUS

## 2019-08-27 MED ORDER — LIDOCAINE HCL (PF) 1 % IJ SOLN
INTRAMUSCULAR | Status: AC
  Filled 2019-08-27: qty 5

## 2019-08-27 MED ORDER — PHENYLEPHRINE 40 MCG/ML (10ML) SYRINGE FOR IV PUSH (FOR BLOOD PRESSURE SUPPORT)
PREFILLED_SYRINGE | INTRAVENOUS | Status: AC
  Filled 2019-08-27: qty 10

## 2019-08-27 MED ORDER — LABETALOL HCL 5 MG/ML IV SOLN
INTRAVENOUS | Status: DC | PRN
  Administered 2019-08-27: 10 mg via INTRAVENOUS

## 2019-08-27 MED ORDER — ALUM & MAG HYDROXIDE-SIMETH 200-200-20 MG/5ML PO SUSP: 15.0000 mL | ORAL | Status: DC | PRN

## 2019-08-27 MED ORDER — ISOSORBIDE MONONITRATE ER 60 MG PO TB24
90.0000 mg | ORAL_TABLET | Freq: Every day | ORAL | Status: DC
  Administered 2019-08-28 – 2019-08-29 (×2): 90 mg via ORAL
  Filled 2019-08-27 (×2): qty 1

## 2019-08-27 MED ORDER — CEFAZOLIN SODIUM-DEXTROSE 2-4 GM/100ML-% IV SOLN
2.0000 g | Freq: Three times a day (TID) | INTRAVENOUS | Status: AC
  Administered 2019-08-27 (×2): 2 g via INTRAVENOUS
  Filled 2019-08-27 (×2): qty 100

## 2019-08-27 MED ORDER — PROTAMINE SULFATE 10 MG/ML IV SOLN
INTRAVENOUS | Status: AC
  Filled 2019-08-27: qty 5

## 2019-08-27 MED ORDER — 0.9 % SODIUM CHLORIDE (POUR BTL) OPTIME
TOPICAL | Status: DC | PRN
  Administered 2019-08-27: 2000 mL

## 2019-08-27 MED ORDER — ALBUTEROL SULFATE (2.5 MG/3ML) 0.083% IN NEBU: 2.5000 mg | INHALATION_SOLUTION | Freq: Four times a day (QID) | RESPIRATORY_TRACT | Status: DC | PRN

## 2019-08-27 MED ORDER — PHENYLEPHRINE 40 MCG/ML (10ML) SYRINGE FOR IV PUSH (FOR BLOOD PRESSURE SUPPORT)
PREFILLED_SYRINGE | INTRAVENOUS | Status: DC | PRN
  Administered 2019-08-27 (×3): 80 ug via INTRAVENOUS

## 2019-08-27 MED ORDER — ROCURONIUM BROMIDE 10 MG/ML (PF) SYRINGE
PREFILLED_SYRINGE | INTRAVENOUS | Status: DC | PRN
  Administered 2019-08-27: 70 mg via INTRAVENOUS

## 2019-08-27 MED ORDER — MAGNESIUM SULFATE 2 GM/50ML IV SOLN: 2.0000 g | Freq: Every day | INTRAVENOUS | Status: DC | PRN

## 2019-08-27 MED ORDER — DOCUSATE SODIUM 100 MG PO CAPS
100.0000 mg | ORAL_CAPSULE | Freq: Every day | ORAL | Status: DC
  Administered 2019-08-28 – 2019-08-29 (×2): 100 mg via ORAL
  Filled 2019-08-27 (×2): qty 1

## 2019-08-27 MED ORDER — MEPERIDINE HCL 25 MG/ML IJ SOLN: 6.2500 mg | INTRAMUSCULAR | Status: DC | PRN

## 2019-08-27 MED ORDER — HEMOSTATIC AGENTS (NO CHARGE) OPTIME
TOPICAL | Status: DC | PRN
  Administered 2019-08-27: 1 via TOPICAL

## 2019-08-27 MED ORDER — METOPROLOL TARTRATE 5 MG/5ML IV SOLN: 2.0000 mg | INTRAVENOUS | Status: DC | PRN

## 2019-08-27 MED ORDER — OXYCODONE HCL 5 MG PO TABS
5.0000 mg | ORAL_TABLET | ORAL | Status: DC | PRN
  Administered 2019-08-27 – 2019-08-28 (×4): 5 mg via ORAL
  Filled 2019-08-27 (×4): qty 1

## 2019-08-27 MED ORDER — LIDOCAINE 2% (20 MG/ML) 5 ML SYRINGE
INTRAMUSCULAR | Status: DC | PRN
  Administered 2019-08-27: 100 mg via INTRAVENOUS

## 2019-08-27 MED ORDER — FENTANYL CITRATE (PF) 250 MCG/5ML IJ SOLN
INTRAMUSCULAR | Status: AC
  Filled 2019-08-27: qty 5

## 2019-08-27 MED ORDER — ROCURONIUM BROMIDE 10 MG/ML (PF) SYRINGE
PREFILLED_SYRINGE | INTRAVENOUS | Status: AC
  Filled 2019-08-27: qty 10

## 2019-08-27 SURGICAL SUPPLY — 54 items
ADH SKN CLS APL DERMABOND .7 (GAUZE/BANDAGES/DRESSINGS) ×1
ADPR TBG 2 MALE LL ART (MISCELLANEOUS)
BAG DECANTER FOR FLEXI CONT (MISCELLANEOUS) ×2 IMPLANT
CANISTER SUCT 3000ML PPV (MISCELLANEOUS) ×2 IMPLANT
CATH ROBINSON RED A/P 18FR (CATHETERS) ×2 IMPLANT
CLIP VESOCCLUDE MED 24/CT (CLIP) ×2 IMPLANT
CLIP VESOCCLUDE SM WIDE 24/CT (CLIP) ×2 IMPLANT
COVER PROBE W GEL 5X96 (DRAPES) ×2 IMPLANT
COVER WAND RF STERILE (DRAPES) ×1 IMPLANT
DERMABOND ADVANCED (GAUZE/BANDAGES/DRESSINGS) ×1
DERMABOND ADVANCED .7 DNX12 (GAUZE/BANDAGES/DRESSINGS) ×1 IMPLANT
DRAIN CHANNEL 15F RND FF W/TCR (WOUND CARE) IMPLANT
ELECT REM PT RETURN 9FT ADLT (ELECTROSURGICAL) ×2
ELECTRODE REM PT RTRN 9FT ADLT (ELECTROSURGICAL) ×1 IMPLANT
EVACUATOR SILICONE 100CC (DRAIN) IMPLANT
GLOVE BIO SURGEON STRL SZ 6.5 (GLOVE) ×4 IMPLANT
GLOVE BIO SURGEON STRL SZ7.5 (GLOVE) ×2 IMPLANT
GLOVE BIO SURGEON STRL SZ8 (GLOVE) ×1 IMPLANT
GLOVE BIOGEL PI IND STRL 6.5 (GLOVE) IMPLANT
GLOVE BIOGEL PI INDICATOR 6.5 (GLOVE) ×2
GOWN STRL NON-REIN LRG LVL3 (GOWN DISPOSABLE) ×1 IMPLANT
GOWN STRL REUS W/ TWL LRG LVL3 (GOWN DISPOSABLE) ×2 IMPLANT
GOWN STRL REUS W/ TWL XL LVL3 (GOWN DISPOSABLE) ×1 IMPLANT
GOWN STRL REUS W/TWL LRG LVL3 (GOWN DISPOSABLE) ×6
GOWN STRL REUS W/TWL XL LVL3 (GOWN DISPOSABLE) ×4
HEMOSTAT SNOW SURGICEL 2X4 (HEMOSTASIS) ×1 IMPLANT
INSERT FOGARTY SM (MISCELLANEOUS) IMPLANT
IV ADAPTER SYR DOUBLE MALE LL (MISCELLANEOUS) IMPLANT
KIT BASIN OR (CUSTOM PROCEDURE TRAY) ×2 IMPLANT
KIT SHUNT ARGYLE CAROTID ART 6 (VASCULAR PRODUCTS) ×2 IMPLANT
KIT TURNOVER KIT B (KITS) ×2 IMPLANT
NDL HYPO 25GX1X1/2 BEV (NEEDLE) IMPLANT
NDL SPNL 20GX3.5 QUINCKE YW (NEEDLE) IMPLANT
NEEDLE HYPO 25GX1X1/2 BEV (NEEDLE) IMPLANT
NEEDLE SPNL 20GX3.5 QUINCKE YW (NEEDLE) IMPLANT
NS IRRIG 1000ML POUR BTL (IV SOLUTION) ×6 IMPLANT
PACK CAROTID (CUSTOM PROCEDURE TRAY) ×2 IMPLANT
PAD ARMBOARD 7.5X6 YLW CONV (MISCELLANEOUS) ×4 IMPLANT
PATCH VASC XENOSURE 1CMX6CM (Vascular Products) ×2 IMPLANT
PATCH VASC XENOSURE 1X6 (Vascular Products) IMPLANT
POSITIONER HEAD DONUT 9IN (MISCELLANEOUS) ×2 IMPLANT
SHUNT CAROTID BYPASS 10 (VASCULAR PRODUCTS) ×1 IMPLANT
STOPCOCK 4 WAY LG BORE MALE ST (IV SETS) IMPLANT
SUT ETHILON 3 0 PS 1 (SUTURE) IMPLANT
SUT MNCRL AB 4-0 PS2 18 (SUTURE) ×2 IMPLANT
SUT PROLENE 6 0 BV (SUTURE) ×2 IMPLANT
SUT SILK 3 0 (SUTURE)
SUT SILK 3-0 18XBRD TIE 12 (SUTURE) IMPLANT
SUT VIC AB 3-0 SH 27 (SUTURE) ×2
SUT VIC AB 3-0 SH 27X BRD (SUTURE) ×1 IMPLANT
SYR CONTROL 10ML LL (SYRINGE) IMPLANT
TOWEL GREEN STERILE (TOWEL DISPOSABLE) ×2 IMPLANT
TUBING ART PRESS 48 MALE/FEM (TUBING) IMPLANT
WATER STERILE IRR 1000ML POUR (IV SOLUTION) ×2 IMPLANT

## 2019-08-27 NOTE — Transfer of Care (Signed)
Immediate Anesthesia Transfer of Care Note  Patient: Kayla Mcclure  Procedure(s) Performed: ENDARTERECTOMY CAROTID RIGHT WITH PATCH ANGIOPLASTY (Right Neck)  Patient Location: PACU  Anesthesia Type:General  Level of Consciousness: awake, oriented and patient cooperative  Airway & Oxygen Therapy: Patient Spontanous Breathing and Patient connected to face mask oxygen  Post-op Assessment: Report given to RN, Post -op Vital signs reviewed and stable and Patient moving all extremities X 4  Post vital signs: Reviewed and stable  Last Vitals:  Vitals Value Taken Time  BP 124/59 08/27/19 0929  Temp    Pulse    Resp 14 08/27/19 0930  SpO2    Vitals shown include unvalidated device data.  Last Pain:  Vitals:   08/27/19 0630  TempSrc: Oral  PainSc: 0-No pain      Patients Stated Pain Goal: 3 (03/26/82 4621)  Complications: No apparent anesthesia complications

## 2019-08-27 NOTE — H&P (Signed)
   History and Physical Update  The patient was interviewed and re-examined.  The patient's previous History and Physical has been reviewed and is unchanged from recent office visit. Plan for right CEA today in OR. We again discussed risks and benefits and alternatives.   Janell Keeling C. Donzetta Matters, MD [facility] Office: [phone] Pager: [phone]  08/27/2019, 7:12 AM

## 2019-08-27 NOTE — Op Note (Signed)
Patient name: Donnisha Besecker MRN: [id number] DOB: 1945-09-25 Sex: female  08/27/2019 Pre-operative Diagnosis: aysmptomatic high grade right ICA stenosis Post-operative diagnosis:  Same Surgeon:  Erlene Quan C. Donzetta Matters, MD Assistant: Leontine Locket, PA Procedure Performed: Right carotid endarterectomy with bovine pericardial patch and 10 French shunt neuro protection  Indications: 74 year old female with a history of a symptomatic left ICA lesion status post left carotid endarterectomy.  She is now indicated for right carotid endarterectomy to prevent future symptoms  Findings: There is a heavily calcified lesion just at the bifurcation.  The lesion was actually quite high.  The ansa cervicalis and the a sending pharyngeal artery were divided to gain exposure.  We had good endarterectomy with smooth tapering distally.  At completion there was expected signal in the ICA distally with low resistance sound and patient was neurologically intact upon awakening from anesthesia.   Procedure:  The patient was identified in the holding area and taken to the operating room where she was placed supine on the operating table and general anesthesia was induced.  She was sterilely prepped and draped in the right neck and chest in the usual fashion antibiotics were administered and a timeout was called.  We began with incision along the anterior border the sternocleidomastoid.  We dissected down the multiple veins divided between the ties.  We identified the common carotid artery placed umbilical tape around this and patient was given 8000 units of heparin.  ACT returned greater than 240.  We dissected up onto the external carotid artery.  We placed a vessel loop around both the superior throat a branch and external.  We then notified the hypoglossal nerve.  The vagus nerve was also identified and protected.  We traced the ansa cervicalis and divided this for better exposure.  We also divided and a sending pharyngeal  branch of the external carotid artery for exposure.  We identified a internal carotid artery that appeared healthy.  We placed a vessel loop around this.  A 10 French shunt was prepared.  Blood pressure was systolic 440.  Patch was prepared as well.  We clamped the internal carotid followed by common and external carotid arteries.  We opened the vessel longitudinally from the common onto the internal.  We irrigated with heparinized saline.  We placed the shunt distally allowed to backbleed placed approximately.  We confirmed flow with Doppler.  We proceeded with endarterectomy including eversion of the external.  We had very good tapering distally.  We irrigated again we allowed backbleeding from the external and did thoroughly flushed this.  We then sewed our patch in place with 6-0 Prolene suture.  Prior to completion we removed our shunt we allowed backbleeding followed by antegrade bleeding.  We then irrigated with heparinized saline to full syringes.  We completed our patch.  We released our clamp on our external followed by common carotid arteries.  There were 2 places with primary suture repair was performed.  We then released our clamp on her internal carotid artery.  We had good signal distally.  There was actually a good signal in the external to although with some high resistance.  Satisfied we administered 50 mg of protamine.  We obtain hemostasis and irrigated thoroughly.  We closed the platysma followed by the skin with 4 Monocryl.  Dermabond is placed at the level of the skin.  She is awakened from anesthesia having tolerated procedure well and was noted to be neurologically intact and transferred to the recovery room.  All counts were correct at completion.  EBL: 100 cc     Jarelyn Bambach C. Donzetta Matters, MD [facility] [place] Office: [phone] Pager: [phone]

## 2019-08-27 NOTE — Discharge Instructions (Signed)
° °  [facility] ° °Discharge Instructions °  °Carotid Endarterectomy (CEA) ° °Please refer to the following instructions for your post-procedure care. Your surgeon or physician assistant will discuss any changes with you. ° °Activity ° °You are encouraged to walk as much as you can. You can slowly return to normal activities but must avoid strenuous activity and heavy lifting until your doctor tell you it's okay. Avoid activities such as vacuuming or swinging a golf club. You can drive after one week if you are comfortable and you are no longer taking prescription pain medications. It is normal to feel tired for serval weeks after your surgery. It is also normal to have difficulty with sleep habits, eating, and bowel movements after surgery. These will go away with time. ° °Bathing/Showering ° °Shower daily after you go home. Do not soak in a bathtub, hot tub, or swim until the incision heals completely. ° °Incision Care ° °Shower every day. Clean your incision with mild soap and water. Pat the area dry with a clean towel. You do not need a bandage unless otherwise instructed. Do not apply any ointments or creams to your incision. You may have skin glue on your incision. Do not peel it off. It will come off on its own in about one week. Your incision may feel thickened and raised for several weeks after your surgery. This is normal and the skin will soften over time.  ° °For Men Only: It's okay to shave around the incision but do not shave the incision itself for 2 weeks. It is common to have numbness under your chin that could last for several months. ° °Diet ° °Resume your normal diet. There are no special food restrictions following this procedure. A low fat/low cholesterol diet is recommended for all patients with vascular disease. In order to heal from your surgery, it is CRITICAL to get adequate nutrition. Your body requires vitamins, minerals, and protein. Vegetables are the  best source of vitamins and minerals. Vegetables also provide the perfect balance of protein. Processed food has little nutritional value, so try to avoid this. ° °Medications ° °Resume taking all of your medications unless your doctor or physician assistant tells you not to. If your incision is causing pain, you may take over-the- counter pain relievers such as acetaminophen (Tylenol). If you were prescribed a stronger pain medication, please be aware these medications can cause nausea and constipation. Prevent nausea by taking the medication with a snack or meal. Avoid constipation by drinking plenty of fluids and eating foods with a high amount of fiber, such as fruits, vegetables, and grains.  °Do not take Tylenol if you are taking prescription pain medications. ° °Follow Up ° °Our office will schedule a follow up appointment 2-3 weeks following discharge. ° °Please call us immediately for any of the following conditions ° °Increased pain, redness, drainage (pus) from your incision site. °Fever of 101 degrees or higher. °If you should develop stroke (slurred speech, difficulty swallowing, weakness on one side of your body, loss of vision) you should call 911 and go to the nearest emergency room. ° °Reduce your risk of vascular disease: ° °Stop smoking. If you would like help call [facility] at 1-800-QUIT-NOW (1-800-784-8669) or [facility] at [phone]. °Manage your cholesterol °Maintain a desired weight °Control your diabetes °Keep your blood pressure down ° °If you have any questions, please call the office at [phone]. ° °

## 2019-08-27 NOTE — Progress Notes (Signed)
Pt transferred to 4E-21 via bed from PACU. Pt given CHG bath. Tele applied, CCMD notified. Pt oriented to call bell, bed and room. Call bell within reach. R carotid level 0. Neuro intact. Will continue to monitor.  Jaye Polidori N Cj Edgell, RN\

## 2019-08-27 NOTE — Anesthesia Postprocedure Evaluation (Signed)
Anesthesia Post Note  Patient: Kayla Mcclure  Procedure(s) Performed: ENDARTERECTOMY CAROTID RIGHT WITH PATCH ANGIOPLASTY (Right Neck)     Patient location during evaluation: PACU Anesthesia Type: General Level of consciousness: awake and alert Pain management: pain level controlled Vital Signs Assessment: post-procedure vital signs reviewed and stable Respiratory status: spontaneous breathing, nonlabored ventilation, respiratory function stable and patient connected to nasal cannula oxygen Cardiovascular status: blood pressure returned to baseline and stable Postop Assessment: no apparent nausea or vomiting Anesthetic complications: no    Last Vitals:  Vitals:   08/27/19 1412 08/27/19 1433  BP:  (!) 89/39  Pulse:  68  Resp:  15  Temp: (!) 36.2 C 37.1 C  SpO2:  100%    Last Pain:  Vitals:   08/27/19 1433  TempSrc: Oral  PainSc:                  OSSEY,KEVIN DAVID

## 2019-08-27 NOTE — Progress Notes (Signed)
  Day of Surgery Note    Subjective:  Receiving second NS bolus for BP.  No near-syncope when OOB.  No complaints   Vitals:   08/27/19 1433 08/27/19 1445  BP: (!) 89/39 (!) 91/33  Pulse: 68 75  Resp: 15 17  Temp: 98.7 F (37.1 C)   SpO2: 100% 100%   sys BP 100-120s  Incisions:   Right neck incision well approximated without edema or oozing Extremities:  5/5 bil hand grip strength and bil foot dorsiflexion Cardiac:  RRR Lungs:  nonlabored    Assessment/Plan:  This is a 74 y.o. female who is s/p right CEA VSS. Neuro intact  -   Risa Grill, PA-C 08/27/2019 3:23 PM [phone]

## 2019-08-27 NOTE — Anesthesia Procedure Notes (Signed)
Arterial Line Insertion Start/End5/27/2021 7:00 AM, 08/27/2019 7:04 AM Performed by: Orlie Dakin, CRNA, CRNA  Patient location: Pre-op. Preanesthetic checklist: patient identified, IV checked, risks and benefits discussed, surgical consent, monitors and equipment checked, pre-op evaluation and timeout performed Lidocaine 1% used for infiltration and patient sedated Left, radial was placed Catheter size: 20 G Hand hygiene performed  and maximum sterile barriers used   Attempts: 1 Procedure performed without using ultrasound guided technique. Following insertion, dressing applied and Biopatch. Post procedure assessment: normal  Patient tolerated the procedure well with no immediate complications.

## 2019-08-27 NOTE — Anesthesia Procedure Notes (Addendum)
Procedure Name: Intubation Date/Time: 08/27/2019 7:45 AM Performed by: Orlie Dakin, CRNA Pre-anesthesia Checklist: Patient identified, Emergency Drugs available, Suction available and Patient being monitored Patient Re-evaluated:Patient Re-evaluated prior to induction Oxygen Delivery Method: Circle system utilized Preoxygenation: Pre-oxygenation with 100% oxygen Induction Type: IV induction Ventilation: Mask ventilation without difficulty Laryngoscope Size: Mac and 4 Grade View: Grade I Tube type: Oral Tube size: 7.5 mm Number of attempts: 1 Airway Equipment and Method: Stylet Placement Confirmation: ETT inserted through vocal cords under direct vision,  positive ETCO2 and breath sounds checked- equal and bilateral Secured at: 23 cm Tube secured with: Tape Dental Injury: Teeth and Oropharynx as per pre-operative assessment  Comments: Oral airway placed for end of proc.

## 2019-08-28 LAB — TYPE AND SCREEN
ABO/RH(D): A POS
Antibody Screen: NEGATIVE
Unit division: 0
Unit division: 0
Unit division: 0
Unit division: 0
Unit division: 0
Unit division: 0

## 2019-08-28 LAB — BASIC METABOLIC PANEL
Anion gap: 10 (ref 5–15)
BUN: 21 mg/dL (ref 8–23)
CO2: 16 mmol/L — ABNORMAL LOW (ref 22–32)
Calcium: 7.4 mg/dL — ABNORMAL LOW (ref 8.9–10.3)
Chloride: 110 mmol/L (ref 98–111)
Creatinine, Ser: 1.33 mg/dL — ABNORMAL HIGH (ref 0.44–1.00)
GFR calc Af Amer: 46 mL/min — ABNORMAL LOW (ref >60)
GFR calc non Af Amer: 40 mL/min — ABNORMAL LOW (ref >60)
Glucose, Bld: 95 mg/dL (ref 70–99)
Potassium: 4.9 mmol/L (ref 3.5–5.1)
Sodium: 136 mmol/L (ref 135–145)

## 2019-08-28 LAB — BPAM RBC
Blood Product Expiration Date: 202106062359
Blood Product Expiration Date: 202106082359
Blood Product Expiration Date: 202106182359
Blood Product Expiration Date: 202106192359
Blood Product Expiration Date: 202106242359
Blood Product Expiration Date: 202106262359
Unit Type and Rh: 6200
Unit Type and Rh: 6200
Unit Type and Rh: 6200
Unit Type and Rh: 6200
Unit Type and Rh: 6200
Unit Type and Rh: 6200

## 2019-08-28 LAB — CBC
HCT: 18.6 % — ABNORMAL LOW (ref 36.0–46.0)
HCT: 26.5 % — ABNORMAL LOW (ref 36.0–46.0)
Hemoglobin: 5.6 g/dL — CL (ref 12.0–15.0)
Hemoglobin: 8.1 g/dL — ABNORMAL LOW (ref 12.0–15.0)
MCH: 29.2 pg (ref 26.0–34.0)
MCH: 28.5 pg (ref 26.0–34.0)
MCHC: 30.1 g/dL (ref 30.0–36.0)
MCHC: 30.6 g/dL (ref 30.0–36.0)
MCV: 96.9 fL (ref 80.0–100.0)
MCV: 93.3 fL (ref 80.0–100.0)
Platelets: 182 10*3/uL (ref 150–400)
Platelets: 171 10*3/uL (ref 150–400)
RBC: 1.92 MIL/uL — ABNORMAL LOW (ref 3.87–5.11)
RBC: 2.84 MIL/uL — ABNORMAL LOW (ref 3.87–5.11)
RDW: 18 % — ABNORMAL HIGH (ref 11.5–15.5)
RDW: 18.5 % — ABNORMAL HIGH (ref 11.5–15.5)
WBC: 9.3 10*3/uL (ref 4.0–10.5)
WBC: 9.6 10*3/uL (ref 4.0–10.5)
nRBC: 0 % (ref 0.0–0.2)
nRBC: 0.4 % — ABNORMAL HIGH (ref 0.0–0.2)

## 2019-08-28 LAB — PREPARE RBC (CROSSMATCH)

## 2019-08-28 MED ORDER — SODIUM CHLORIDE 0.9% IV SOLUTION: Freq: Once | INTRAVENOUS | Status: DC

## 2019-08-28 NOTE — Progress Notes (Signed)
Date and time critical results received: 08/28/19 04:35am   Test: CBC  Critical Value: Hb 5.6  Name of Provider Notified: Dr. Donzetta Matters  Orders Received for type and screen and transfuse PRBC 2 units.  Pt's alert and oriented, no neurological deficits. Able to ambulate to bathroom with one standby assisted. She's hemodynamically stable. Remained afebrile, HR 70s -80s, sinus rhythm on monitor, BP 109/50 - 124/ 48 mmHg, on room air SPO2 96-97%. No bleeding/hematoma on right neck incision.. Oxycodone given PRN for pain. Pt tolerated well. No distress noted. Will continue to monitor.  Kennyth Lose, RN

## 2019-08-28 NOTE — Progress Notes (Addendum)
  Progress Note    08/28/2019 7:33 AM 1 Day Post-Op  Subjective:  No complaints. States she tolerated diet yesterday evening. She did get up to ambulate to commode but felt very weak and short of breath early this morning around 2 am   Vitals:   08/28/19 0318 08/28/19 0412  BP: (!) 124/48 (!) 124/43  Pulse: 76 77  Resp: 20 18  Temp: 98.7 F (37.1 C)   SpO2: 100% 97%   Physical Exam: General: well appearing, well nourished, not in any acute discomfort Cardiac:  regular Lungs:  Non labored Incisions: right neck incision clean, dry and intact without hematoma Extremities:  Moving all extremities without deficits Abdomen: soft, non tender Neurologic: Alert and oriented, CN intact, tongue midline, smile symmetric, speech coherent  CBC    Component Value Date/Time   WBC 9.3 08/28/2019 0405   RBC 1.92 (L) 08/28/2019 0405   HGB 5.6 (LL) 08/28/2019 0405   HGB 12.0 02/05/2019 0836   HCT 18.6 (L) 08/28/2019 0405   HCT 38.5 02/05/2019 0836   PLT 182 08/28/2019 0405   PLT 212 02/05/2019 0836   MCV 96.9 08/28/2019 0405   MCV 81 02/05/2019 0836   MCH 29.2 08/28/2019 0405   MCHC 30.1 08/28/2019 0405   RDW 18.0 (H) 08/28/2019 0405   RDW 17.3 (H) 02/05/2019 0836   LYMPHSABS 1.7 08/06/2019 1652   MONOABS 0.9 08/06/2019 1652   EOSABS 0.1 08/06/2019 1652   BASOSABS 0.0 08/06/2019 1652    BMET    Component Value Date/Time   NA 136 08/28/2019 0243   NA 140 02/05/2019 0836   K 4.9 08/28/2019 0243   CL 110 08/28/2019 0243   CO2 16 (L) 08/28/2019 0243   GLUCOSE 95 08/28/2019 0243   BUN 21 08/28/2019 0243   BUN 12 02/05/2019 0836   CREATININE 1.33 (H) 08/28/2019 0243   CREATININE 1.31 (H) 05/07/2013 0947   CALCIUM 7.4 (L) 08/28/2019 0243   GFRNONAA 40 (L) 08/28/2019 0243   GFRAA 46 (L) 08/28/2019 0243    INR    Component Value Date/Time   INR 1.1 08/25/2019 0931     Intake/Output Summary (Last 24 hours) at 08/28/2019 0733 Last data filed at 08/28/2019 0100 Gross per  24 hour  Intake 3318.31 ml  Output 100 ml  Net 3218.31 ml     Assessment/Plan:  74 y.o. female is s/p right carotid endarterectomy 1 Day Post-Op. She is doing well post op. Right neck incision without hematoma. Neurologically intact. Acute post operative blood loss anemia with Hgb of 5.6. 2 units of PRBCs ordered for transfusion. Hemodynamically stable. Tolerating diet. Mobilize today. Likely discharge tomorrow  DVT prophylaxis:    Marval Regal [facility] [phone] 08/28/2019 7:33 AM   I have independently interviewed and examined patient and agree with PA assessment and plan above.  No gross evidence of bleeding and blood loss in the OR yesterday was minimal.  She has 2 recent hospitalizations for GI bleed but does not have any evidence of recurrence recently.  We will transfuse today and recheck H&H possibly will be okay for discharge tomorrow.  Sabir Charters C. Donzetta Matters, MD [facility] [place] Office: [phone] Pager: [phone]

## 2019-08-28 NOTE — Care Management (Deleted)
Spoke to [place], patients sister in the room. Apparently patient is just renting a room.from from someone. T . Spoke to [place] from [place]. Who states she has spoken to [place] the other sisiter who was at the patients home.here is no ac in the house, and the room is no liveable at this time.  The roommate is trying to get window unit air conditioners  for tomorrow. Sister Alyse Low wants to take the patient home to [place] . I spoke to [place] and said that  The other sisiter Joy lives here in [place]. Jordan Hawks was going to call Alyse Low back to see if both sisters could talk and perhaps a plan can be made for Joy's house or residential hospice. Will continue to follow

## 2019-08-29 MED ORDER — HYDROCODONE-ACETAMINOPHEN 5-325 MG PO TABS: 1.0000 | ORAL_TABLET | ORAL | 0 refills | Status: DC | PRN

## 2019-08-29 NOTE — Progress Notes (Addendum)
  Progress Note    08/29/2019 7:48 AM 2 Days Post-Op  Subjective: no complaints, ready to go home   Vitals:   08/29/19 0443 08/29/19 0732  BP: 126/68 134/69  Pulse: 75 73  Resp: 17 16  Temp: 98.5 F (36.9 C) 98.4 F (36.9 C)  SpO2: 100% 98%   Physical Exam: Cardiac: regular Lungs: non labored Incisions:  Right neck incision clean, dry and intact without hematoma Extremities: moving all extremities without weakness. 5/5 strength bilaterally Abdomen:  Soft and non tender Neurologic: alert and oriented, CN intact  CBC    Component Value Date/Time   WBC 9.6 08/28/2019 2033   RBC 2.84 (L) 08/28/2019 2033   HGB 8.1 (L) 08/28/2019 2033   HGB 12.0 02/05/2019 0836   HCT 26.5 (L) 08/28/2019 2033   HCT 38.5 02/05/2019 0836   PLT 171 08/28/2019 2033   PLT 212 02/05/2019 0836   MCV 93.3 08/28/2019 2033   MCV 81 02/05/2019 0836   MCH 28.5 08/28/2019 2033   MCHC 30.6 08/28/2019 2033   RDW 18.5 (H) 08/28/2019 2033   RDW 17.3 (H) 02/05/2019 0836   LYMPHSABS 1.7 08/06/2019 1652   MONOABS 0.9 08/06/2019 1652   EOSABS 0.1 08/06/2019 1652   BASOSABS 0.0 08/06/2019 1652    BMET    Component Value Date/Time   NA 136 08/28/2019 0243   NA 140 02/05/2019 0836   K 4.9 08/28/2019 0243   CL 110 08/28/2019 0243   CO2 16 (L) 08/28/2019 0243   GLUCOSE 95 08/28/2019 0243   BUN 21 08/28/2019 0243   BUN 12 02/05/2019 0836   CREATININE 1.33 (H) 08/28/2019 0243   CREATININE 1.31 (H) 05/07/2013 0947   CALCIUM 7.4 (L) 08/28/2019 0243   GFRNONAA 40 (L) 08/28/2019 0243   GFRAA 46 (L) 08/28/2019 0243    INR    Component Value Date/Time   INR 1.1 08/25/2019 0931     Intake/Output Summary (Last 24 hours) at 08/29/2019 0748 Last data filed at 08/29/2019 0500 Gross per 24 hour  Intake 2609.52 ml  Output -  Net 2609.52 ml     Assessment/Plan:  74 y.o. female is s/p right carotid endartectomy 2 Days Post-Op. Doing well post op. Neurologically intact. Hgb at 8.1 post transfusion of  2 units yesterday. She has baseline anemia with history of GI bleed. Hemodynamically stable.Tolerating diet. Ambulating well. She will continue her Aspirin, Statin and Plavix. She will discharge home today with follow up in 2-3 weeks with Dr. Jeri Lager, PA-C [facility] [phone] 08/29/2019 7:48 AM    I agree with the above.  She had an appropriate response to blood yesterday.  She has not had a bowel movement.  She is neuro intact and her incision looks good.  She will be discharged home today  Annamarie Major

## 2019-08-29 NOTE — Discharge Summary (Signed)
Carotid Discharge Summary     Kayla Mcclure 04-02-1946 74 y.o. female  [id number]  Admission Date: 08/27/2019  Discharge Date: 08/29/19  Physician: Thomes Lolling*  Admission Diagnosis: Carotid artery stenosis [I65.29] Asymptomatic carotid artery stenosis without infarction, right [facility] Hospital Course:  The patient was admitted to the hospital and taken to the operating room on 08/27/2019 and underwent a right carotid endarterectomy.  The pt tolerated the procedure well and was transported to the PACU in good condition. Patient was later transferred to 4E in stable condition.  Blood pressure was soft when initially on the floor. Received bolus of NS. Otherwise neurologically intact. Incision clean,dry and intact without hematoma. Tolerating diet  By POD 1, the pt neuro status intact. Ambulated to bathroom and became very weak and short of breath during the night. Hgb found to be 5.6. She was transfused with 2 units of PRBCs. Tolerating diet. Pain well controlled.  POD#2, neuro status still intact. Tolerating ambulation. Hemoglobin responded appropriately after transfusion to 8.1. Hemodynamically stable. The remainder of the hospital course consisted of increasing mobilization and increasing intake of solids without difficulty.  Stable for discharge. She is on Aspirin, Statin and Plavix. She will discharge home with follow up in 2-3 weeks with Dr. Donzetta Matters   Recent Labs    08/28/19 0243  NA 136  K 4.9  CL 110  CO2 16*  GLUCOSE 95  BUN 21  CALCIUM 7.4*   Recent Labs    08/28/19 0405 08/28/19 2033  WBC 9.3 9.6  HGB 5.6* 8.1*  HCT 18.6* 26.5*  PLT 182 171   No results for input(s): INR in the last 72 hours.   Discharge Instructions    Discharge patient   Complete by: As directed    Discharge disposition: 01-Home or Self Care   Discharge patient date: 08/28/2019      Discharge Diagnosis:  Carotid artery stenosis [I65.29] Asymptomatic  carotid artery stenosis without infarction, right [I65.21]  Secondary Diagnosis: Patient Active Problem List   Diagnosis Date Noted  . Asymptomatic carotid artery stenosis without infarction, right 08/27/2019  . Acute GI bleeding 08/06/2019  . CKD (chronic kidney disease), stage III 08/06/2019  . Carotid artery stenosis 07/28/2019  . Carotid stenosis 07/28/2019  . Language difficulty 05/19/2019  . Paresthesia 05/19/2019  . Chronic diastolic heart failure ([place])   . Syncope 05/12/2019  . Chronic diastolic CHF (congestive heart failure) ([place]) 05/12/2019  . Progressive angina ([place]) 02/04/2019  . Coronary artery disease involving native coronary artery of native heart with angina pectoris ([place]) 02/04/2019  . Acute blood loss anemia   . Symptomatic anemia 12/19/2015  . DOE (dyspnea on exertion) 09/03/2015  . Orthostatic dizziness 07/13/2015  . Bilateral carotid artery disease ([place]) 07/08/2015  . Former heavy cigarette smoker (20-39 per day) 02/04/2014  . GI bleed 09/27/2013  . Anemia 09/11/2013  . Claudication ([place]) 05/04/2013  . Claudication of left lower extremity > right 03/06/2013  . Dyslipidemia, goal LDL below 70 12/23/2012  . PAD (peripheral artery disease) - bilateral CIA & CFA 50-69%; Claudication 12/22/2012  . Obesity (BMI 30-39.9) 12/22/2012  . Internal hemorrhoids 04/11/2010  . Diverticulosis 04/11/2010  . Chronic insomnia 10/07/2009  . Iron deficiency anemia 09/23/2008  . Chronic back pain 09/23/2008  . CKD, stage 2 08/15/2008  . Essential hypertension, benign 10/31/2007  . Asthma 10/31/2007  . CAD S/P percutaneous coronary angioplasty -- Promus DES 2.5 mm x 20 mm postdilated to 3 mm 08/05/2007  . History of  MI (myocardial infarction)  - NSTEMI with PCI of RCA. 08/05/2007   Past Medical History:  Diagnosis Date  . Acute GI bleeding 08/07/2019  . Asthma  10/31/2007  . CAD (coronary artery disease) 08/05/2007   a) NSTEMI 2009: PCI to the RCA Promus DES 2.5 mm x 23  mm (3.0 mm). b) Myoview June 2017: Nonspecific ST changes. EF greater than 65%. LOW RISK, normal study. No ischemia or infarction.  . Carotid artery disease ([place])    a. 50-69% RICA by outpt dopp 04/22/13, f/u due 04/2014.  Marland Kitchen Chronic back pain   . Chronic diastolic heart failure ([place])   . CKD (chronic kidney disease), stage II   . Diverticulosis   04/11/2010    Colonoscopy  . Dyslipidemia, goal LDL below 70   . Essential hypertension    a. Normal renal arteries by PV angio 05/2013.  Marland Kitchen GERD (gastroesophageal reflux disease)   . GI bleed 12/2015  . History of kidney stones 1975  . History of recent blood transfusion 12/2015   12-19-15, 12-20-15 and 12-21-15  . Insomnia   . Internal hemorrhoids  04/11/2010   By colonoscopy  . Iron deficiency anemia   . Myocardial infarction ([place]) 2009  . Peripheral arterial disease ([place])    a. s/p PTA and stenting of L CIA stenosis 05/2013.  Marland Kitchen Renal insufficiency   . Stroke [facility])    mini stroke  . Tobacco abuse    Quit in 05/2013    Allergies as of 08/29/2019   No Known Allergies     Medication List    STOP taking these medications   oxyCODONE 5 MG immediate release tablet Commonly known as: Oxy IR/ROXICODONE     TAKE these medications   albuterol 108 (90 Base) MCG/ACT inhaler Commonly known as: VENTOLIN HFA Inhale 2 puffs into the lungs every 6 (six) hours as needed for wheezing or shortness of breath.   aspirin EC 81 MG tablet Take 81 mg by mouth daily.   atorvastatin 80 MG tablet Commonly known as: LIPITOR Take 1 tablet (80 mg total) by mouth daily.   Breo Ellipta 100-25 MCG/INH Aepb Generic drug: fluticasone furoate-vilanterol Inhale 1 puff into the lungs daily.   clopidogrel 75 MG tablet Commonly known as: PLAVIX Take 1 tablet (75 mg total) by mouth daily.   ezetimibe 10 MG tablet Commonly known as: ZETIA Take 1 tablet (10 mg total) by mouth daily.   ferrous gluconate 324 MG tablet Commonly known as: FERGON Take 1 tablet  (324 mg total) by mouth 2 (two) times daily with a meal.   HYDROcodone-acetaminophen 5-325 MG tablet Commonly known as: NORCO/VICODIN Take 1 tablet by mouth every 4 (four) hours as needed for moderate pain.   isosorbide mononitrate 60 MG 24 hr tablet Commonly known as: IMDUR Take 1.5 tablets (90 mg total) by mouth daily.   metoprolol succinate 100 MG 24 hr tablet Commonly known as: TOPROL-XL Take 2 tablets (200 mg total) by mouth at bedtime.   nitroGLYCERIN 0.4 MG SL tablet Commonly known as: NITROSTAT Place 1 tablet (0.4 mg total) under the tongue every 5 (five) minutes as needed for chest pain.   ondansetron 4 MG tablet Commonly known as: ZOFRAN Take 1 tablet (4 mg total) by mouth every 6 (six) hours as needed for nausea.   pantoprazole 40 MG tablet Commonly known as: PROTONIX Take 1 tablet (40 mg total) by mouth daily.        Discharge Instructions:   [facility] Discharge Instructions Carotid Endarterectomy (CEA)  Please refer to the following instructions for your post-procedure care. Your surgeon or physician assistant will discuss any changes with you.  Activity  You are encouraged to walk as much as you can. You can slowly return to normal activities but must avoid strenuous activity and heavy lifting until your doctor tell you it's OK. Avoid activities such as vacuuming or swinging a golf club. You can drive after one week if you are comfortable and you are no longer taking prescription pain medications. It is normal to feel tired for serval weeks after your surgery. It is also normal to have difficulty with sleep habits, eating, and bowel movements after surgery. These will go away with time.  Bathing/Showering  You may shower after you come home. Do not soak in a bathtub, hot tub, or swim until the incision heals completely.  Incision Care  Shower every day. Clean your incision with mild soap and water. Pat the area dry with a  clean towel. You do not need a bandage unless otherwise instructed. Do not apply any ointments or creams to your incision. You may have skin glue on your incision. Do not peel it off. It will come off on its own in about one week. Your incision may feel thickened and raised for several weeks after your surgery. This is normal and the skin will soften over time. For Men Only: It's OK to shave around the incision but do not shave the incision itself for 2 weeks. It is common to have numbness under your chin that could last for several months.  Diet  Resume your normal diet. There are no special food restrictions following this procedure. A low fat/low cholesterol diet is recommended for all patients with vascular disease. In order to heal from your surgery, it is CRITICAL to get adequate nutrition. Your body requires vitamins, minerals, and protein. Vegetables are the best source of vitamins and minerals. Vegetables also provide the perfect balance of protein. Processed food has little nutritional value, so try to avoid this.  Medications  Resume taking all of your medications unless your doctor or physician assistant tells you not to.  If your incision is causing pain, you may take over-the- counter pain relievers such as acetaminophen (Tylenol). If you were prescribed a stronger pain medication, please be aware these medications can cause nausea and constipation.  Prevent nausea by taking the medication with a snack or meal. Avoid constipation by drinking plenty of fluids and eating foods with a high amount of fiber, such as fruits, vegetables, and grains. Do not take Tylenol if you are taking prescription pain medications.  Follow Up  Our office will schedule a follow up appointment 2-3 weeks following discharge.  Please call us immediately for any of the following conditions  . Increased pain, redness, drainage (pus) from your incision site. . Fever of 101 degrees or higher. . If you should  develop stroke (slurred speech, difficulty swallowing, weakness on one side of your body, loss of vision) you should call 911 and go to the nearest emergency room. .  Reduce your risk of vascular disease:  . Stop smoking. If you would like help call [facility] at 1-800-QUIT-NOW 406-156-1309) or [place] at [phone]. . Manage your cholesterol . Maintain a desired weight . Control your diabetes . Keep your blood pressure down .  If you have any questions, please call the office at [phone].  Prescriptions given: Hydrocodone- Acetaminophen 5-325 mg #15  No Refill  Disposition: Home  Patient's condition: is Excellent  Follow up: 1. Dr. Donzetta Matters in 2- 3 weeks.   Aarsh Fristoe PA-C [facility] [phone]   --- For [facility] Registry use ---   Modified Rankin score at D/C (0-6): 0  IV medication needed for:  1. Hypertension: No 2. Hypotension: Yes  Post-op Complications: No  1. Post-op CVA or TIA: No  If yes: Event classification (right eye, left eye, right cortical, left cortical, verterobasilar, other): n/a  If yes: Timing of event (intra-op, <6 hrs post-op, >=6 hrs post-op, unknown): n/a  2. CN injury: No  If yes: CN not injuried   3. Myocardial infarction: No  If yes: Dx by (EKG or clinical, Troponin): n/a  4.  CHF: No  5.  Dysrhythmia (new): No  6. Wound infection: No  7. Reperfusion symptoms: No  8. Return to OR: No  If yes: return to OR for (bleeding, neurologic, other CEA incision, other): no  Discharge medications: Statin use:  Yes ASA use:  Yes   Beta blocker use:  Yes ACE-Inhibitor use:  No  ARB use:  No CCB use: No P2Y12 Antagonist use: Yes, Valu.Nieves ] Plavix, [ ]  Plasugrel, [ ]  Ticlopinine, [ ]  Ticagrelor, [ ]  Other, [ ]  No for medical reason, [ ]  Non-compliant, [ ]  Not-indicated Anti-coagulant use:  No, [ ]  Warfarin, [ ]  Rivaroxaban, [ ]  Dabigatran,

## 2019-08-29 NOTE — Progress Notes (Signed)
Pt given dicharge instructions with understanding. Pt has no questions at this time. Pt called son for ride home. IV and Monitor d/c. Awaiting ride home.

## 2019-08-30 LAB — BPAM RBC
Blood Product Expiration Date: 202106062359
Blood Product Expiration Date: 202106082359
Blood Product Expiration Date: 202106182359
Blood Product Expiration Date: 202106192359
Blood Product Expiration Date: 202106242359
Blood Product Expiration Date: 202106262359
ISSUE DATE / TIME: 202105280849
ISSUE DATE / TIME: 202105281158
Unit Type and Rh: 6200
Unit Type and Rh: 6200
Unit Type and Rh: 6200
Unit Type and Rh: 6200
Unit Type and Rh: 6200
Unit Type and Rh: 6200

## 2019-08-30 LAB — TYPE AND SCREEN
ABO/RH(D): A POS
Antibody Screen: NEGATIVE
Unit division: 0
Unit division: 0
Unit division: 0
Unit division: 0
Unit division: 0
Unit division: 0

## 2019-09-03 ENCOUNTER — Ambulatory Visit: Payer: Medicare Other | Admitting: Cardiology

## 2019-09-03 ENCOUNTER — Other Ambulatory Visit: Payer: Self-pay

## 2019-09-03 ENCOUNTER — Encounter: Payer: Self-pay | Admitting: Cardiology

## 2019-09-03 DIAGNOSIS — R0600 Dyspnea, unspecified: Secondary | ICD-10-CM

## 2019-09-03 DIAGNOSIS — I10 Essential (primary) hypertension: Secondary | ICD-10-CM

## 2019-09-03 DIAGNOSIS — I25119 Atherosclerotic heart disease of native coronary artery with unspecified angina pectoris: Secondary | ICD-10-CM

## 2019-09-03 DIAGNOSIS — D649 Anemia, unspecified: Secondary | ICD-10-CM

## 2019-09-03 DIAGNOSIS — I6523 Occlusion and stenosis of bilateral carotid arteries: Secondary | ICD-10-CM

## 2019-09-03 DIAGNOSIS — I200 Unstable angina: Secondary | ICD-10-CM

## 2019-09-03 DIAGNOSIS — R0609 Other forms of dyspnea: Secondary | ICD-10-CM

## 2019-09-03 DIAGNOSIS — E785 Hyperlipidemia, unspecified: Secondary | ICD-10-CM | POA: Diagnosis not present

## 2019-09-03 DIAGNOSIS — I5032 Chronic diastolic (congestive) heart failure: Secondary | ICD-10-CM

## 2019-09-03 MED ORDER — FUROSEMIDE 20 MG PO TABS
ORAL_TABLET | ORAL | 3 refills | Status: DC
Start: 2019-09-03 — End: 2020-04-15

## 2019-09-03 MED ORDER — ISOSORBIDE MONONITRATE ER 60 MG PO TB24: 120.0000 mg | ORAL_TABLET | Freq: Every day | ORAL | 3 refills | Status: DC

## 2019-09-03 NOTE — Patient Instructions (Addendum)
Medication Instructions:  Increase Imdur ( isosorbide mono to 120 mg ( 2 tablets of 60 mg) daily   Lasix ( furosemide)  20 mg  Take on mondays, wednesdays, fridays, may take an extra tablet as needed on another day if needed *If you need a refill on your cardiac medications before your next appointment, please call your pharmacy*  Other Instructions Discuss with Dr Donzetta Matters how long will you need to take Aspirin and Plavix    Lab Work: Not needed   Testing/Procedures: Not needed   Follow-Up: At [facility], you and your health needs are our priority.  As part of our continuing mission to provide you with exceptional heart care, we have created designated Provider Care Teams.  These Care Teams include your primary Cardiologist (physician) and Advanced Practice Providers (APPs -  Physician Assistants and Nurse Practitioners) who all work together to provide you with the care you need, when you need it.  We recommend signing up for the patient portal called "MyChart".  Sign up information is provided on this After Visit Summary.  MyChart is used to connect with patients for Virtual Visits (Telemedicine).  Patients are able to view lab/test results, encounter notes, upcoming appointments, etc.  Non-urgent messages can be sent to your provider as well.   To learn more about what you can do with MyChart, go to [email or web address].    Your next appointment:   3 to 4  month(s)  The format for your next appointment:   In Person  Provider:   [name], MD

## 2019-09-03 NOTE — Progress Notes (Signed)
Primary Care Provider: [name], MD Cardiologist: [name], MD  Vascular Cardiologist: Quay Burow, MD Vascular Surgeon: Servando Snare, MD Electrophysiologist: None  Clinic Note: Chief Complaint  Patient presents with   Hospitalization Follow-up    After both carotid arteries were done, and also with anemia.   Loss of Consciousness    Related to anemia of chronic disease   Coronary Artery Disease    Has some chest discomfort off and on.    HPI:    Kayla Mcclure is a 74 y.o. female with a PMH below who presents today for what amounts to be hospital follow-up after at least 4 hospitalizations since last visit with me..   CAD:non-STEMI in 2009 with DES PCI to the RCA,   Cath in February 11, 2019 revealing patent RCA stent with mild in-stent restenosis.  RP AV 75% small caliber vessel minimum LAD, left circumflex disease.    PAD with lifestyle limiting claudication--> PTCA of the L Com & External Iliac A.  (Dr. Gwenlyn Found)  Kayla Mcclure was last seen by me on February 24, 2019 as a follow-up from her recent cath showing stable disease (symptoms are concerning for unstable angina).  She was given furosemide 20 mg daily along with nitroglycerin and continued on aspirin and Plavix. -->  She said her shortness of breath notably improved and her blood pressures were also doing better.  She was using her Breo inhaler and it helped significantly, but still noting exertional dyspnea..  Reduced amlodipine to 5 mg daily and increase Imdur to 60 daily  She was actually seen by Jory Sims on February 24.  This was after an admission for possible syncopal episode she was found to be anemic with hemoglobin 8.5 creatinine 1.3 - Fe Deficiency.  There was no evidence of stroke by MRI.  However she was noted to have progression of carotid artery disease (80-90% R ICA and 60 to 79% L ICA and is now s/p Bilateral Carotid Endarterectomy. --> diuretics removed (to avoid  dehydration) along with amlodipine.  Recent Hospitalizations:   05/11/2019-symptomatic anemia/syncope.  Hemoglobin 8.5.  During planned GI work-up, has had neurologic symptoms a code stroke called.-If GI evaluation felt to be low-grade GI bleed questionable AVMs in small bowel.  (Also noting Schatzki's ring and small hiatal hernia) PPI initiated.  07/28/2019-Left Carotid Endarterectomy  08/06/2018 admitted for upper GI bleed.-Noted 3-4 days of dark tarry stool as well as exertional dyspnea.  Chest comfort with heavy exertion.  Hemoglobin level down to 5.8 declines all blood positive.  2 units PRBC given.  IV PPI.-Converted to oral.  08/27/2019: Right carotid enterectomy cath  Reviewed  CV studies:    The following studies were reviewed today: (if available, images/films reviewed: From Epic Chart or Care Everywhere)  May 14, 2019: Normal EF 60 to 65%.  No R WMA.  Uric UTD.  Normal RV but mildly elevated pressures.  Relatively normal aortic and mitral valves.   Interval History:   Kayla Mcclure returns here today stating that she has had chest tightness over the last couple days usually short-lived and can occur with both activity or at rest.  She had 2 particular episode that she noted quantitative exposure. She sleeps on 2 pillows and occasionally gets short of breath when she lays down but not usually.  She occasionally notes some exertional dyspnea but is relatively stable.  CV Review of Symptoms (Summary) Cardiovascular ROS: positive for - chest pain, dyspnea on exertion, edema and All  relatively stable. negative for - irregular heartbeat, orthopnea, palpitations, paroxysmal nocturnal dyspnea, rapid heart rate, shortness of breath or No further syncope/near syncope or TIA/amaurosis fugax  The patient does not have symptoms concerning for COVID-19 infection (fever, chills, cough, or new shortness of breath).  The patient is practicing social distancing & Masking.   She has had  her COVID-19 vaccine injections.  REVIEWED OF SYSTEMS   Review of Systems  Constitutional: Negative for malaise/fatigue (Energy level, back to normal after her neck procedures.) and weight loss.  HENT: Negative for congestion and nosebleeds.   Respiratory: Positive for shortness of breath and wheezing. Negative for cough and sputum production.   Cardiovascular: Negative for palpitations and leg swelling.  Gastrointestinal: Negative for blood in stool and melena (Has had some dark stools).  Genitourinary: Negative for hematuria.  Musculoskeletal: Positive for joint pain. Negative for falls and neck pain (Neck started to feel better since surgery).  Neurological: Negative for dizziness and headaches.  Psychiatric/Behavioral: Negative.    Has had some constipation and dark stools since starting aspirin Plavix.  Dyspnea, cough, wheezing.  Still 2 episodes over last 2 months for blood transfusion.  (also transfused after CEA) -- no large vessel bleed noted.   I have reviewed and (if needed) personally updated the patient's problem list, medications, allergies, past medical and surgical history, social and family history.   PAST MEDICAL HISTORY   Past Medical History:  Diagnosis Date   Acute GI bleeding 08/07/2019   Asthma  10/31/2007   CAD (coronary artery disease) 08/05/2007   a) NSTEMI 2009: PCI to the RCA Promus DES 2.5 mm x 23 mm (3.0 mm). b) Myoview June 2017: Nonspecific ST changes. EF greater than 65%. LOW RISK, normal study. No ischemia or infarction.   Carotid artery disease [facility])    s/p Bilateral CEA March & May 2020.    Chronic back pain    Chronic diastolic heart failure (HCC)    CKD (chronic kidney disease), stage II    Diverticulosis   04/11/2010    Colonoscopy   Dyslipidemia, goal LDL below 70    Essential hypertension    a. Normal renal arteries by PV angio 05/2013.   GERD (gastroesophageal reflux disease)    GI bleed 12/2015   History of kidney stones  1975   History of recent blood transfusion 12/2015   12-19-15, 12-20-15 and 12-21-15   Insomnia    Internal hemorrhoids  04/11/2010   By colonoscopy   Iron deficiency anemia    Myocardial infarction [facility]) 2009   Peripheral arterial disease ([place])    a. s/p PTA and stenting of L CIA stenosis 05/2013.   Renal insufficiency    Stroke [facility])    mini stroke   Tobacco abuse    Quit in 05/2013    PAST SURGICAL HISTORY   Past Surgical History:  Procedure Laterality Date   CHOLECYSTECTOMY  1975   COLONOSCOPY  04/11/2010   colonscopy  11/30/2015   CORONARY ANGIOPLASTY WITH STENT PLACEMENT  08/05/2007   Promus DES 2.5 mm x 23 mm (3 mm)  to RCA; EF 60-70%   ENDARTERECTOMY Left 07/28/2019   Procedure: ENDARTERECTOMY CAROTID;  Surgeon: Waynetta Sandy, MD;  Location: [place];  Service: Vascular;  Laterality: Left;   ENDARTERECTOMY Right 08/27/2019   Procedure: ENDARTERECTOMY CAROTID RIGHT WITH PATCH ANGIOPLASTY;  Surgeon: Waynetta Sandy, MD;  Location: [place];  Service: Vascular;  Laterality: Right;   ENTEROSCOPY N/A 12/27/2015   Procedure: ENTEROSCOPY;  Surgeon: Clarene Essex, MD;  Location: [facility];  Service: Endoscopy;  Laterality: N/A;  also needs slim egd scope   ENTEROSCOPY N/A 05/15/2019   Procedure: ENTEROSCOPY;  Surgeon: Ronald Lobo, MD;  Location: [place];  Service: Endoscopy;  Laterality: N/A;  Push enteroscopy using either ultraslim colonoscope or push enteroscope   ENTEROSCOPY N/A 08/08/2019   Procedure: ENTEROSCOPY;  Surgeon: Ronnette Juniper, MD;  Location: [place];  Service: Gastroenterology;  Laterality: N/A;   ESOPHAGOGASTRODUODENOSCOPY N/A 09/13/2013   Procedure: ESOPHAGOGASTRODUODENOSCOPY (EGD);  Surgeon: Lear Ng, MD;  Location: [facility] END[facility]COPY;  Service: Endoscopy;  Laterality: N/A;   ESOPHAGOGASTRODUODENOSCOPY N/A 07/14/2015   Procedure: ESOPHAGOGASTRODUODENOSCOPY (EGD);  Surgeon: Clarene Essex, MD;  Location: [facility] ENDOSCOPY;   Service: Endoscopy;  Laterality: N/A;   gi bleed  07/2015   blood given   HOT HEMOSTASIS N/A 12/27/2015   Procedure: HOT HEMOSTASIS (ARGON PLASMA COAGULATION/BICAP);  Surgeon: Clarene Essex, MD;  Location: [facility];  Service: Endoscopy;  Laterality: N/A;   HOT HEMOSTASIS N/A 08/08/2019   Procedure: HOT HEMOSTASIS (ARGON PLASMA COAGULATION/BICAP);  Surgeon: Ronnette Juniper, MD;  Location: [place];  Service: Gastroenterology;  Laterality: N/A;   ILIAC ARTERY STENT Left 05/04/2013   L CIA-EIA -- Dr. Gwenlyn Found   LEFT HEART CATH AND CORONARY ANGIOGRAPHY N/A 02/11/2019   Procedure: LEFT HEART CATH AND CORONARY ANGIOGRAPHY;  Surgeon: Leonie Man, MD;  Location: [place] CV LAB;;-  Patent RCA stent with minimal ISR after pRCA 50%).  Small caliber RPAV with ~75%.  pLAD 30%, m-dLAD 40%, D2 40%.  Normal LVEDP and LVEF.   LOWER EXTREMITY ANGIOGRAM Bilateral 05/04/2013   Procedure: LOWER EXTREMITY ANGIOGRAM;  Surgeon: Lorretta Harp, MD;  Location: [facility] CATH LAB;  Service: Cardiovascular;  Laterality: Bilateral;   NM MYOVIEW LTD  09/07/2015   EF > 65%. Nonspecific ST changes but no ischemic changes. No ischemia or infarction. LOW RISK   PATCH ANGIOPLASTY Left 07/28/2019   Procedure: PATCH ANGIOPLASTY USING Rueben Bash BIOLOGIC PATCH;  Surgeon: Waynetta Sandy, MD;  Location: [place];  Service: Vascular;  Laterality: Left;   TONSILLECTOMY  1960's   TRANSTHORACIC ECHOCARDIOGRAM  09/21/2015   EF 60-65%. GR 1 DD. No regional wall motion analysis. No valve disease.   UPPER GI ENDOSCOPY  12/14/2015    Cardiac Cath 02/11/2019: Patent RCA stent with minimal ISR after pRCA 50%).  Small caliber RPAV with ~75%.  pLAD 30%, m-dLAD 40%, D2 40%.  Normal LVEDP and LVEF.    MEDICATIONS/ALLERGIES   Current Meds  Medication Sig   albuterol (VENTOLIN HFA) 108 (90 Base) MCG/ACT inhaler Inhale 2 puffs into the lungs every 6 (six) hours as needed for wheezing or shortness of breath.   aspirin EC 81 MG  tablet Take 81 mg by mouth daily.   atorvastatin (LIPITOR) 80 MG tablet Take 1 tablet (80 mg total) by mouth daily.   clopidogrel (PLAVIX) 75 MG tablet Take 1 tablet (75 mg total) by mouth daily.   ezetimibe (ZETIA) 10 MG tablet Take 1 tablet (10 mg total) by mouth daily.   ferrous gluconate (FERGON) 324 MG tablet Take 1 tablet (324 mg total) by mouth 2 (two) times daily with a meal.   fluticasone furoate-vilanterol (BREO ELLIPTA) 100-25 MCG/INH AEPB Inhale 1 puff into the lungs daily.   HYDROcodone-acetaminophen (NORCO/VICODIN) 5-325 MG tablet Take 1 tablet by mouth every 4 (four) hours as needed for moderate pain.   metoprolol succinate (TOPROL-XL) 100 MG 24 hr tablet Take 2 tablets (200 mg total)  by mouth at bedtime.   nitroGLYCERIN (NITROSTAT) 0.4 MG SL tablet Place 1 tablet (0.4 mg total) under the tongue every 5 (five) minutes as needed for chest pain.   ondansetron (ZOFRAN) 4 MG tablet Take 1 tablet (4 mg total) by mouth every 6 (six) hours as needed for nausea.   pantoprazole (PROTONIX) 40 MG tablet Take 1 tablet (40 mg total) by mouth daily.   [DISCONTINUED] isosorbide mononitrate (IMDUR) 60 MG 24 hr tablet Take 1.5 tablets (90 mg total) by mouth daily.    No Known Allergies  SOCIAL HISTORY/FAMILY HISTORY   Reviewed in Epic:  Pertinent findings: back home post-op.    OBJCTIVE -PE, EKG, labs   Wt Readings from Last 3 Encounters:  09/03/19 139 lb 3.2 oz (63.1 kg)  08/27/19 135 lb 12.8 oz (61.6 kg)  08/25/19 135 lb 12.8 oz (61.6 kg)    Physical Exam: BP (!) 116/52    Pulse 75    Ht 4\' 11"  (1.499 m)    Wt 139 lb 3.2 oz (63.1 kg)    SpO2 95%    BMI 28.11 kg/m  Physical Exam  Constitutional: She is oriented to person, place, and time. She appears well-developed and well-nourished. No distress.  Relatively healthy-appearing.  Well-groomed  HENT:  Head: Normocephalic and atraumatic.  Eyes: EOM are normal.  Neck: Normal carotid pulses and no JVD present. No  thyromegaly present.  Bilateral carotid neurectomy slides healing well.  No bruit heard.  Cardiovascular: Normal rate, regular rhythm, normal heart sounds and intact distal pulses.  No extrasystoles are present. PMI is not displaced. Exam reveals no gallop and no friction rub.  No murmur heard. Pulmonary/Chest: Effort normal. No respiratory distress. She has wheezes (Mild late expiratory). She exhibits tenderness (Notable reproducibility along left sternal border and radiating up to the left shoulder.  Point tenderness that reproduces her chest pain.).  Normal breath sounds but exertional wheezing.  Abdominal: Soft. Bowel sounds are normal. She exhibits no distension. There is no abdominal tenderness. There is no rebound.  No HSM  Musculoskeletal:        General: Edema (1+) present. Normal range of motion.     Cervical back: Normal range of motion and neck supple.  Neurological: She is alert and oriented to person, place, and time.  Psychiatric: She has a normal mood and affect. Her behavior is normal. Judgment and thought content normal.  Vitals reviewed.   Adult ECG Report Not checked  Recent Labs:   CBC Latest Ref Rng & Units 08/28/2019 08/28/2019 08/25/2019  WBC 4.0 - 10.5 K/uL 9.6 9.3 6.9  Hemoglobin 12.0 - 15.0 g/dL 8.1(L) 5.6(LL) 8.0(L)  Hematocrit 36.0 - 46.0 % 26.5(L) 18.6(L) 27.4(L)  Platelets 150 - 400 K/uL 171 182 222    Lab Results  Component Value Date   CHOL 91 05/15/2019   HDL 45 05/15/2019   LDLCALC 35 05/15/2019   TRIG 55 05/15/2019   CHOLHDL 2.0 05/15/2019   Lab Results  Component Value Date   CREATININE 1.33 (H) 08/28/2019   BUN 21 08/28/2019   NA 136 08/28/2019   K 4.9 08/28/2019   CL 110 08/28/2019   CO2 16 (L) 08/28/2019   Lab Results  Component Value Date   TSH 2.161 04/28/2013    ASSESSMENT/PLAN    Problem List Items Addressed This Visit    Coronary artery disease involving native coronary artery of native heart with angina pectoris ([place])  (Chronic)    Currently having chest pain, very reproducible  on exam.  I do not think this is anginal in nature. Regardless, she is no longer on amlodipine for antianginal benefit.  We will therefore increase Imdur to 120 mg daily since she is still having chest pain.  Low threshold to reduce back down symptoms resolved.  Also she does have some mild edema.  Will add 3 days a week Lasix as well. We will continue her meds including Toprol rosuvastatin and Zetia..      Relevant Medications   isosorbide mononitrate (IMDUR) 60 MG 24 hr tablet   furosemide (LASIX) 20 MG tablet   Essential hypertension, benign (Chronic)    Blood pressures look pretty good and she is no longer on them.  Plan: Increase Imdur to 120 mg daily.      Relevant Medications   isosorbide mononitrate (IMDUR) 60 MG 24 hr tablet   furosemide (LASIX) 20 MG tablet   Dyslipidemia, goal LDL below 70 (Chronic)    Outstanding lipids as of February.  Continue her medications.  With significant carotid and peripheral vascular disease, restless possible.  She is on high-dose high intensity atorvastatin plus Zetia.      Relevant Medications   isosorbide mononitrate (IMDUR) 60 MG 24 hr tablet   furosemide (LASIX) 20 MG tablet   Bilateral carotid artery disease (HCC) (Chronic)    History of bilateral carotid endarterectomy over the past several months.  Now decision for when to stop aspirin or Plavix would be more based on the concerns from vascular surgery.  Have asked that she discuss this with Dr.Cain especially in light of her recent GI bleed issues.      Relevant Medications   isosorbide mononitrate (IMDUR) 60 MG 24 hr tablet   furosemide (LASIX) 20 MG tablet   DOE (dyspnea on exertion)    She did not February, and normal coronary arteries back in November.  I suspect this is probably more musculoskeletal type chest pain she is having I think her dyspnea could be related to pulmonary disease but also anemia and demand  ischemia.      Progressive angina ([place])    Thankfully, her symptoms but probably not anginal in nature back in November 2020.  Her symptoms today sound more atypical, but there is some exertional dyspnea.  Wondering how much of the symptoms are related to her ongoing anemia issues.  Plan: Increase Imdur to 120 mg daily since amlodipine has been stopped.  Recommend discussing with vascular surgeon-Dr. Donzetta Matters, already continued use of aspirin Plavix duration.      Relevant Medications   isosorbide mononitrate (IMDUR) 60 MG 24 hr tablet   furosemide (LASIX) 20 MG tablet   Symptomatic anemia    Hemoglobin level is now lower.  Suspect that she probably will need iron infusions some sort versus another unit of blood.  For now we will manage her questions as duration of treatment with aspirin Plavix.  Hopefully can potentially stop the leads one of the 2 medicines.      Chronic diastolic CHF (congestive heart failure) (HCC)    Seems euvolemic on exam.  Blood pressure looks great.  She is on Imdur high-dose Toprol.  No longer on ACE inhibitor/ARB Blocker. Her Lasix Was Stopped in the Hospital Because of Possible Dehydration.  Baby Could Probably Start Back on Low-Dose to Avoid Some Swelling We Will Do 20 Mg Lasix 3 Days a Week and Then As Needed.      Relevant Medications   isosorbide mononitrate (IMDUR) 60 MG 24 hr tablet  furosemide (LASIX) 20 MG tablet    Overall stable.  Nonetheless she is having some chest comfort which to me sounds musculoskeletal.  Breathing is a little worse and she has some edema.  Plan would add low-dose Lasix back (diuretics was discontinued during her early submission of this year.  I think by doing 3 days a week Lasix we will avoid dehydration. Also increase Imdur to 120 mg daily until blood pressures are stabilized.   COVID-19 Education: The signs and symptoms of COVID-19 were discussed with the patient and how to seek care for testing (follow up with PCP or  arrange E-visit).   The importance of social distancing and COVID-19 vaccination was discussed today.  I spent a total of 24 minutes with the patient. >  50% of the time was spent in direct patient consultation.  Additional time spent with chart review  / charting (studies, outside notes, etc): 18--additional time spent reviewing echocardiogram as well as 2 hospitalizations for CVA and to further anemia. Total Time: 42 min   Current medicines are reviewed at length with the patient today.  (+/- concerns) asked about duration of treatment for aspirin Plavix.  Notice: This dictation was prepared with Dragon dictation along with smaller phrase technology. Any transcriptional errors that result from this process are unintentional and may not be corrected upon review.  Patient Instructions / Medication Changes & Studies & Tests Ordered   Patient Instructions  Medication Instructions:  Increase Imdur ( isosorbide mono to 120 mg ( 2 tablets of 60 mg) daily   Lasix ( furosemide)  20 mg  Take on mondays, wednesdays, fridays, may take an extra tablet as needed on another day if needed *If you need a refill on your cardiac medications before your next appointment, please call your pharmacy*  Other Instructions Discuss with Dr Donzetta Matters how long will you need to take Aspirin and Plavix    Lab Work: Not needed   Testing/Procedures: Not needed   Follow-Up: At [facility], you and your health needs are our priority.  As part of our continuing mission to provide you with exceptional heart care, we have created designated Provider Care Teams.  These Care Teams include your primary Cardiologist (physician) and Advanced Practice Providers (APPs -  Physician Assistants and Nurse Practitioners) who all work together to provide you with the care you need, when you need it.  We recommend signing up for the patient portal called "MyChart".  Sign up information is provided on this After Visit Summary.   MyChart is used to connect with patients for Virtual Visits (Telemedicine).  Patients are able to view lab/test results, encounter notes, upcoming appointments, etc.  Non-urgent messages can be sent to your provider as well.   To learn more about what you can do with MyChart, go to [email or web address].    Your next appointment:   3 to 4  month(s)  The format for your next appointment:   In Person  Provider:   [name], MD       Studies Ordered:   No orders of the defined types were placed in this encounter.    [name], M.D., M.S. Interventional Cardiologist   Pager # [phone] Phone # [phone] [address]. [place], [place] [address]   Thank you for choosing Heartcare at [facility]!!

## 2019-09-06 ENCOUNTER — Encounter: Payer: Self-pay | Admitting: Cardiology

## 2019-09-06 NOTE — Assessment & Plan Note (Signed)
History of bilateral carotid endarterectomy over the past several months.  Now decision for when to stop aspirin or Plavix would be more based on the concerns from vascular surgery.  Have asked that she discuss this with Dr.Cain especially in light of her recent GI bleed issues.

## 2019-09-06 NOTE — Assessment & Plan Note (Signed)
Hemoglobin level is now lower.  Suspect that she probably will need iron infusions some sort versus another unit of blood.  For now we will manage her questions as duration of treatment with aspirin Plavix.  Hopefully can potentially stop the leads one of the 2 medicines.

## 2019-09-06 NOTE — Assessment & Plan Note (Signed)
Outstanding lipids as of February.  Continue her medications.  With significant carotid and peripheral vascular disease, restless possible.  She is on high-dose high intensity atorvastatin plus Zetia.

## 2019-09-06 NOTE — Assessment & Plan Note (Signed)
Thankfully, her symptoms but probably not anginal in nature back in November 2020.  Her symptoms today sound more atypical, but there is some exertional dyspnea.  Wondering how much of the symptoms are related to her ongoing anemia issues.  Plan: Increase Imdur to 120 mg daily since amlodipine has been stopped.  Recommend discussing with vascular surgeon-Dr. Donzetta Matters, already continued use of aspirin Plavix duration.

## 2019-09-06 NOTE — Assessment & Plan Note (Signed)
Seems euvolemic on exam.  Blood pressure looks great.  She is on Imdur high-dose Toprol.  No longer on ACE inhibitor/ARB Blocker. Her Lasix Was Stopped in the Hospital Because of Possible Dehydration.  Baby Could Probably Start Back on Low-Dose to Avoid Some Swelling We Will Do 20 Mg Lasix 3 Days a Week and Then As Needed.

## 2019-09-06 NOTE — Assessment & Plan Note (Signed)
She did not February, and normal coronary arteries back in November.  I suspect this is probably more musculoskeletal type chest pain she is having I think her dyspnea could be related to pulmonary disease but also anemia and demand ischemia.

## 2019-09-06 NOTE — Assessment & Plan Note (Signed)
Currently having chest pain, very reproducible on exam.  I do not think this is anginal in nature. Regardless, she is no longer on amlodipine for antianginal benefit.  We will therefore increase Imdur to 120 mg daily since she is still having chest pain.  Low threshold to reduce back down symptoms resolved.  Also she does have some mild edema.  Will add 3 days a week Lasix as well. We will continue her meds including Toprol rosuvastatin and Zetia.Marland Kitchen

## 2019-09-06 NOTE — Assessment & Plan Note (Addendum)
Blood pressures look pretty good and she is no longer on them.  Plan: Increase Imdur to 120 mg daily.

## 2019-09-18 ENCOUNTER — Encounter: Payer: Self-pay | Admitting: Vascular Surgery

## 2019-09-18 ENCOUNTER — Ambulatory Visit (INDEPENDENT_AMBULATORY_CARE_PROVIDER_SITE_OTHER): Payer: Self-pay | Admitting: Vascular Surgery

## 2019-09-18 ENCOUNTER — Other Ambulatory Visit: Payer: Self-pay

## 2019-09-18 VITALS — BP 149/77 | HR 96 | Temp 97.9°F | Resp 20 | Ht 59.0 in | Wt 139.0 lb

## 2019-09-18 DIAGNOSIS — I6523 Occlusion and stenosis of bilateral carotid arteries: Secondary | ICD-10-CM

## 2019-09-18 NOTE — Progress Notes (Signed)
    Subjective:     Patient ID: Kayla Mcclure, female   DOB: 05-Jun-1945, 74 y.o.   MRN: [id number]  HPI now status post bilateral carotid endarterectomies.  She does have recent history of GI bleed and is to undergo camera endoscopy.  She is taking aspirin Plavix and statin.  Does have numbness of her midline neck not having any swallowing issues at this time.   Review of Systems Numbness swelling of neck    Objective:   Physical Exam Vitals:   09/18/19 1154 09/18/19 1156  BP: (!) 172/72 (!) 149/77  Pulse: 96   Resp: 20   Temp: 97.9 F (36.6 C)   SpO2: 95%    Awake alert oriented Nonlabored respirations Tongue is midline Incisions are clean dry intact      Assessment/plan     74 year old female status post bilateral carotid endarterectomies the left was done for symptomatic disease the right for asymptomatic disease.  She does have some numbness not having any swallowing issues.  She has had GI bleed okay to hold Plavix and be on single antiplatelet agent only at this time.  She will continue statin.  We will see her in 9 months with carotid duplex.        Isaiyah Feldhaus C. Donzetta Matters, MD [facility] Office: [phone] Pager: [phone]

## 2019-09-23 ENCOUNTER — Other Ambulatory Visit: Payer: Self-pay | Admitting: *Deleted

## 2019-09-23 DIAGNOSIS — I6523 Occlusion and stenosis of bilateral carotid arteries: Secondary | ICD-10-CM

## 2019-10-27 HISTORY — PX: OTHER SURGICAL HISTORY: SHX169

## 2020-01-11 ENCOUNTER — Ambulatory Visit: Payer: Medicare Other | Admitting: Cardiology

## 2020-01-11 ENCOUNTER — Encounter: Payer: Self-pay | Admitting: Cardiology

## 2020-01-11 ENCOUNTER — Other Ambulatory Visit: Payer: Self-pay

## 2020-01-11 VITALS — BP 180/90 | HR 78 | Ht 59.0 in | Wt 135.6 lb

## 2020-01-11 DIAGNOSIS — I5032 Chronic diastolic (congestive) heart failure: Secondary | ICD-10-CM | POA: Diagnosis not present

## 2020-01-11 DIAGNOSIS — E785 Hyperlipidemia, unspecified: Secondary | ICD-10-CM

## 2020-01-11 DIAGNOSIS — I25119 Atherosclerotic heart disease of native coronary artery with unspecified angina pectoris: Secondary | ICD-10-CM | POA: Diagnosis not present

## 2020-01-11 DIAGNOSIS — Z9861 Coronary angioplasty status: Secondary | ICD-10-CM

## 2020-01-11 DIAGNOSIS — I10 Essential (primary) hypertension: Secondary | ICD-10-CM | POA: Diagnosis not present

## 2020-01-11 DIAGNOSIS — Z87891 Personal history of nicotine dependence: Secondary | ICD-10-CM

## 2020-01-11 DIAGNOSIS — I739 Peripheral vascular disease, unspecified: Secondary | ICD-10-CM

## 2020-01-11 DIAGNOSIS — I252 Old myocardial infarction: Secondary | ICD-10-CM

## 2020-01-11 DIAGNOSIS — D500 Iron deficiency anemia secondary to blood loss (chronic): Secondary | ICD-10-CM

## 2020-01-11 DIAGNOSIS — I2510 Atherosclerotic heart disease of native coronary artery without angina pectoris: Secondary | ICD-10-CM | POA: Diagnosis not present

## 2020-01-11 DIAGNOSIS — I6523 Occlusion and stenosis of bilateral carotid arteries: Secondary | ICD-10-CM

## 2020-01-11 MED ORDER — AMLODIPINE BESYLATE 5 MG PO TABS
5.0000 mg | ORAL_TABLET | Freq: Every day | ORAL | 3 refills | Status: DC
Start: 2020-01-11 — End: 2021-03-21

## 2020-01-11 MED ORDER — ISOSORBIDE MONONITRATE ER 60 MG PO TB24: 60.0000 mg | ORAL_TABLET | Freq: Every day | ORAL | 11 refills | Status: DC

## 2020-01-11 NOTE — Patient Instructions (Signed)
Medication Instructions:     RESTART TAKING AMLODIPINE 5 MG ONE TABLET DAILY    DECREASE THE DOSE OF ISOSORBIDE MONONITRATE  TO 60 MG   ONE TABLET DAILY    *If you need a refill on your cardiac medications before your next appointment, please call your pharmacy*   Lab Work: NOT NEEDED   Testing/Procedures: NOT NEEDED   Follow-Up: At [facility], you and your health needs are our priority.  As part of our continuing mission to provide you with exceptional heart care, we have created designated Provider Care Teams.  These Care Teams include your primary Cardiologist (physician) and Advanced Practice Providers (APPs -  Physician Assistants and Nurse Practitioners) who all work together to provide you with the care you need, when you need it.  We recommend signing up for the patient portal called "MyChart".  Sign up information is provided on this After Visit Summary.  MyChart is used to connect with patients for Virtual Visits (Telemedicine).  Patients are able to view lab/test results, encounter notes, upcoming appointments, etc.  Non-urgent messages can be sent to your provider as well.   To learn more about what you can do with MyChart, go to [email or web address].    Your next appointment:   6 month(s)  The format for your next appointment:   In Person  Provider:   [name], MD   Other Instructions You have been referred to [facility] FOR BLOOD PRESSURE

## 2020-01-11 NOTE — Progress Notes (Signed)
Primary Care Provider: [name], MD Cardiologist: [name], MD  Vascular Cardiologist: Quay Burow, MD Vascular Surgeon: Servando Snare, MD GI & Heme-Onc: [facility] -> GI Aviva Signs; Heme Amedeo Plenty. [place] Electrophysiologist: None  Clinic Note: Chief Complaint  Patient presents with  . Follow-up    90-month . Coronary Artery Disease    No recurrent chest pain  . PAD    Both iliac and carotid artery disease.  Stable  . Anemia    Is getting IV iron    HPI:    Kayla GNakesha Ebrahimis a 74y.o. female with a cardiovascular history noted below who presents today 4 MONTH f/u.   CV HISTORY  CAD:non-STEMI in 2009 with DES PCI to the RCA,   Cath in February 11, 2019 => patent RCA stent w/ mild ISR.  small caliber RPAV 75%; minimum LAD, LCx Dz.    Echo 05/2019 = Normal EF 60-65%, no RWMA. ~elevated RVP. Normal Valves.  PAD with lifestyle limiting claudication--> PTCA of the L Com & External Iliac A.  (Dr. BGwenlyn Found  Bilateral Carotid Artery stenosis => (Dr. CDonzetta Matters.   S/p L CEA in April 2021.  S/p R CEA in May 2021.  Complicated by iron deficiency anemia - recurrent GIB - INITIAL PRESENTATION WAS FOR SYNCOPE.  Treated with IV Iron - last Rx 11/20/2019  H/H 11/02/2019 12.8/41.5. (up from 8.5/26.9 as of June 14)    Hospitalizations in 2021:  05/11/2019-SYMPTOMATIC ANEMIA/SYNCOPE.  Hgb 8.5.  GI w/u = low grade GIB - ? Small bowel ABMs (& Schatzki's ring + small hiatal hernia) => PPI.  ,  2/2 Neuro Sx, d neurologic symptoms a code stroke called ->  TIA!, Sx resolved, but Bilateral Carotid stenosis noted.  07/28/2019-Left Carotid Endarterectomy  08/06/2018 - recurrent UGIB - melanotic stool - Sx SOB & CP. Hgb 5.8 - 2 U PRBC; Short term IV PPI -> converted back to PO.  Enteroscopy = single angiectasia without bleeding in first portion of duodenum -> coagulation; single angiectasia noted in proximal jejunum -> coagulation; normal stomach and esophagus.  Capsule Endoscopy  ([facility] -noted "pinhole" leaking in the stomach and intestines.  08/27/2019: R Carotid Endarterectomy  Kayla GMelea Preziosowas last seen by me on September 03, 2019 for hospital follow-up (see above). She was noticing chast tightness over the previous ~ week - both @ rest & with exertion.    CP was reproducible, felt to be MSK chest pain. Also noted wheezing & mild orthopnea.   We added low-dose Lasix for ~ edema & mild 2 pillow orthopnea. ; Imdur increased to 120 mg.   Deferred decision re: ASA/Plavix to Dr. CDonzetta Matters --> PLAVIX HAS BEEN STOPPED, on ASA alone.  Recent Hospitalizations:   /None since last visit  8/6 & 11/20/2019 ([facility]- OutPt IV Iron (ferritin carboxymethyl close--INJECTAFER) infusions.  -- last 2.  Reviewed  CV studies:    The following studies were reviewed today: (if available, images/films reviewed: From Epic Chart or Care Everywhere) . none   Interval History:   Kayla GEllorie Kindallreturns here today actually feeling much better.  Now that her hemoglobin levels are improved with IV iron, she is definitely feeling more like her normal self.  She is not really having the chest discomfort episodes and is much less short of breath with exertion.   She still has been, but has not had any recurrent bleeding.  She denies any headache or blurred vision.  No further chest tightness or pressure with  rest or exertion.  Much less dyspnea.  She takes the Lasix 3 days a week, and is not having to use any additional dosing.  Edema is pretty well controlled.  CV Review of Symptoms (Summary) Cardiovascular ROS: positive for - dyspnea on exertion, edema and Definitely improved dyspnea. negative for - chest pain, irregular heartbeat, orthopnea, palpitations, paroxysmal nocturnal dyspnea, rapid heart rate, shortness of breath or No further syncope/near syncope or TIA/amaurosis fugax.  No claudication.  The patient DOES NOT have SYMPTOMS Concerning for COVID-19 infection (fever, chills,  cough, or new shortness of breath).  The patient is practicing social distancing & Masking.   She has had her COVID-19 vaccine injections.  REVIEWED OF SYSTEMS   Review of Systems  Constitutional: Negative for malaise/fatigue (Energy level, back to normal after her neck procedures.) and weight loss.  HENT: Negative for congestion and nosebleeds.   Respiratory: Negative for cough, sputum production, shortness of breath and wheezing (Less prominent).   Cardiovascular: Negative for palpitations and leg swelling.  Gastrointestinal: Negative for blood in stool, constipation and melena (Has had some dark stools).       She does still have some intermittent upset stomach  Genitourinary: Negative for hematuria.  Musculoskeletal: Positive for joint pain. Negative for falls and neck pain (Neck started to feel better since surgery).  Neurological: Negative for dizziness, focal weakness and headaches.  Psychiatric/Behavioral: Negative.    Has had some constipation and dark stools since starting aspirin Plavix.  Dyspnea, cough, wheezing.  Still 2 episodes over last 2 months for blood transfusion.  (also transfused after CEA) -- no large vessel bleed noted.   I have reviewed and (if needed) personally updated the patient's problem list, medications, allergies, past medical and surgical history, social and family history.   PAST MEDICAL HISTORY   Past Medical History:  Diagnosis Date  . Acute GI bleeding 08/07/2019  . Asthma  10/31/2007  . CAD (coronary artery disease) 08/05/2007   a) NSTEMI 2009: PCI to the RCA Promus DES 2.5 mm x 23 mm (3.0 mm). b) Myoview June 2017: Nonspecific ST changes. EF greater than 65%. LOW RISK, normal study. No ischemia or infarction.  . Carotid artery disease [facility])    s/p Bilateral CEA March & May 2020.   Marland Kitchen Chronic back pain   . Chronic diastolic heart failure ([place])   . CKD (chronic kidney disease), stage II   . Diverticulosis   04/11/2010    Colonoscopy  .  Dyslipidemia, goal LDL below 70   . Essential hypertension    a. Normal renal arteries by PV angio 05/2013.  Marland Kitchen GERD (gastroesophageal reflux disease)   . GI bleed 12/2015  . History of kidney stones 1975  . History of recent blood transfusion 12/2015   12-19-15, 12-20-15 and 12-21-15  . Insomnia   . Internal hemorrhoids  04/11/2010   By colonoscopy  . Iron deficiency anemia   . Myocardial infarction ([place]) 2009  . Peripheral arterial disease ([place])    a. s/p PTA and stenting of L CIA stenosis 05/2013.  Marland Kitchen Renal insufficiency   . Stroke [facility])    mini stroke  . Tobacco abuse    Quit in 05/2013    Immunization History  Administered Date(s) Administered  . Influenza Whole 02/26/2007, 02/04/2009, 01/10/2010  . Influenza, Seasonal, Injecte, Preservative Fre 06/09/2012  . Influenza-Unspecified 12/01/2012  . PFIZER SARS-COV-2 Vaccination 06/23/2019, 07/14/2019  . Pneumococcal Polysaccharide-23 04/02/2005, 06/09/2012, 09/12/2013  . Td 12/21/2008    PAST SURGICAL HISTORY  Past Surgical History:  Procedure Laterality Date  . CAPSULE ENDOSCOPY  10/27/2019   [facility]) evidence of "pinhole leaking" in both stomach and proximal small bowel; no overt bleeding noted  . CHOLECYSTECTOMY  1975  . COLONOSCOPY  04/11/2010  . COLONOSCOPY  11/30/2015  . CORONARY ANGIOPLASTY WITH STENT PLACEMENT  08/05/2007   Promus DES 2.5 mm x 23 mm (3 mm)  to RCA; EF 60-70%  . ENDARTERECTOMY Left 07/28/2019   Procedure: ENDARTERECTOMY CAROTID;  Surgeon: Waynetta Sandy, MD;  Location: [place];  Service: Vascular;  Laterality: Left;  . ENDARTERECTOMY Right 08/27/2019   Procedure: ENDARTERECTOMY CAROTID RIGHT WITH PATCH ANGIOPLASTY;  Surgeon: Waynetta Sandy, MD;  Location: [place];  Service: Vascular;  Laterality: Right;  . ENTEROSCOPY N/A 12/27/2015   Procedure: ENTEROSCOPY;  Surgeon: Clarene Essex, MD;  Location: [facility] ENDOSCOPY;  Service: Endoscopy;  Laterality: N/A;  also needs slim egd scope  .  ENTEROSCOPY N/A 05/15/2019   Procedure: ENTEROSCOPY;  Surgeon: Ronald Lobo, MD;  Location: [place];  Service: Endoscopy;  Laterality: N/A;  Push enteroscopy using either ultraslim colonoscope or push enteroscope  . ENTEROSCOPY N/A 08/08/2019   Procedure: ENTEROSCOPY;  Surgeon: Ronnette Juniper, MD;  Location: [place];  Service: Gastroenterology;  Laterality: N/A;  . ESOPHAGOGASTRODUODENOSCOPY N/A 09/13/2013   Procedure: ESOPHAGOGASTRODUODENOSCOPY (EGD);  Surgeon: Lear Ng, MD;  Location: [facility] ENDOSCOPY;  Service: Endoscopy;  Laterality: N/A;  . ESOPHAGOGASTRODUODENOSCOPY N/A 07/14/2015   Procedure: ESOPHAGOGASTRODUODENOSCOPY (EGD);  Surgeon: Clarene Essex, MD;  Location[facility] ENDOSCOPY;  Service: Endoscopy;  Laterality: N/A;  . gi bleed  07/2015   blood given  . HOT HEMOSTASIS N/A 12/27/2015   Procedure: HOT HEMOSTASIS (ARGON PLASMA COAGULATION/BICAP);  Surgeon: Clarene Essex, MD;  Location: [facility];  Service: Endoscopy;  Laterality: N/A;  . HOT HEMOSTASIS N/A 08/08/2019   Procedure: HOT HEMOSTASIS (ARGON PLASMA COAGULATION/BICAP);  Surgeon: Ronnette Juniper, MD;  Location: [place];  Service: Gastroenterology;  Laterality: N/A;  . ILIAC ARTERY STENT Left 05/04/2013   L CIA-EIA -- Dr. Gwenlyn Found  . LEFT HEART CATH AND CORONARY ANGIOGRAPHY N/A 02/11/2019   Procedure: LEFT HEART CATH AND CORONARY ANGIOGRAPHY;  Surgeon: Leonie Man, MD;  Location: [place] CV LAB;;-  Patent RCA stent with minimal ISR after pRCA 50%).  Small caliber RPAV with ~75%.  pLAD 30%, m-dLAD 40%, D2 40%.  Normal LVEDP and LVEF.  . LOWER EXTREMITY ANGIOGRAM Bilateral 05/04/2013   Procedure: LOWER EXTREMITY ANGIOGRAM;  Surgeon: Lorretta Harp, MD;  Location: [facility] CATH LAB;  Service: Cardiovascular;  Laterality: Bilateral;  . NM MYOVIEW LTD  09/07/2015   EF > 65%. Nonspecific ST changes but no ischemic changes. No ischemia or infarction. LOW RISK  . PATCH ANGIOPLASTY Left 07/28/2019   Procedure: PATCH ANGIOPLASTY USING  Rueben Bash BIOLOGIC PATCH;  Surgeon: Waynetta Sandy, MD;  Location: [place];  Service: Vascular;  Laterality: Left;  . TONSILLECTOMY  1960's  . TRANSTHORACIC ECHOCARDIOGRAM  05/14/2019   Normal EF 60 to 65%.  No R WMA.   Normal RV but mildly elevated pressures.  Relatively normal aortic and mitral valves.  Marland Kitchen UPPER GI ENDOSCOPY  12/14/2015    Cardiac Cath 02/11/2019: Patent RCA stent with minimal ISR after pRCA 50%).  Small caliber RPAV with ~75%.  pLAD 30%, m-dLAD 40%, D2 40%.  Normal LVEDP and LVEF.    MEDICATIONS/ALLERGIES   Current Meds  Medication Sig  . albuterol (VENTOLIN HFA) 108 (90 Base) MCG/ACT inhaler Inhale 2 puffs into the lungs every  6 (six) hours as needed for wheezing or shortness of breath.  Marland Kitchen aspirin EC 81 MG tablet Take 81 mg by mouth daily.  Marland Kitchen atorvastatin (LIPITOR) 80 MG tablet Take 1 tablet (80 mg total) by mouth daily.  Marland Kitchen ezetimibe (ZETIA) 10 MG tablet Take 1 tablet (10 mg total) by mouth daily.  . ferrous gluconate (FERGON) 324 MG tablet Take 1 tablet (324 mg total) by mouth 2 (two) times daily with a meal.  . fluticasone furoate-vilanterol (BREO ELLIPTA) 100-25 MCG/INH AEPB Inhale 1 puff into the lungs daily.  . furosemide (LASIX) 20 MG tablet Take 20 mg  One tablet on Monday , Wednesday , Fridays , may take an additional  20 mg ff needed  . HYDROcodone-acetaminophen (NORCO/VICODIN) 5-325 MG tablet Take 1 tablet by mouth every 4 (four) hours as needed for moderate pain.  . isosorbide mononitrate (IMDUR) 60 MG 24 hr tablet Take 1 tablet (60 mg total) by mouth daily.  . metoprolol succinate (TOPROL-XL) 100 MG 24 hr tablet Take 2 tablets (200 mg total) by mouth at bedtime.  . nitroGLYCERIN (NITROSTAT) 0.4 MG SL tablet Place 1 tablet (0.4 mg total) under the tongue every 5 (five) minutes as needed for chest pain.  Marland Kitchen ondansetron (ZOFRAN) 4 MG tablet Take 1 tablet (4 mg total) by mouth every 6 (six) hours as needed for nausea.  . pantoprazole (PROTONIX) 40 MG  tablet Take 1 tablet (40 mg total) by mouth daily.  . [DISCONTINUED] clopidogrel (PLAVIX) 75 MG tablet Take 1 tablet (75 mg total) by mouth daily.  . [DISCONTINUED] isosorbide mononitrate (IMDUR) 60 MG 24 hr tablet Take 2 tablets (120 mg total) by mouth daily.    No Known Allergies  SOCIAL HISTORY/FAMILY HISTORY   Reviewed in Epic:  Pertinent findings: back home post-op.    OBJCTIVE -PE, EKG, labs   Wt Readings from Last 3 Encounters:  01/11/20 135 lb 9.6 oz (61.5 kg)  09/18/19 139 lb (63 kg)  09/03/19 139 lb 3.2 oz (63.1 kg)    Physical Exam: BP (!) 180/90 Comment: right arm  Pulse 78   Ht _0  (1.499 m)   Wt 135 lb 9.6 oz (61.5 kg)   SpO2 96%   BMI 27.39 kg/m  Physical Exam Vitals reviewed.  Constitutional:      General: She is not in acute distress.    Appearance: Normal appearance. She is well-developed. She is not ill-appearing (Healthy appearing).     Comments: Well-groomed.  HENT:     Head: Normocephalic and atraumatic.  Neck:     Thyroid: No thyromegaly.     Vascular: Normal carotid pulses. No carotid bruit or JVD.     Comments: Bilateral carotid endarterectomy sites well-healed. Cardiovascular:     Rate and Rhythm: Normal rate and regular rhythm.  No extrasystoles are present.    Chest Wall: PMI is not displaced.     Pulses: Intact distal pulses.     Heart sounds: Normal heart sounds. No murmur heard.  No friction rub. No gallop.   Pulmonary:     Effort: Pulmonary effort is normal. No respiratory distress.     Breath sounds: Wheezing (Faint late expiratory) present. No rhonchi or rales.  Chest:     Chest wall: No tenderness.  Abdominal:     General: Bowel sounds are normal. There is no distension.     Palpations: Abdomen is soft.     Tenderness: There is no abdominal tenderness. There is no rebound.  Comments: No HSM or bruit  Musculoskeletal:        General: No swelling. Normal range of motion.     Cervical back: Normal range of motion and neck  supple.  Neurological:     General: No focal deficit present.     Mental Status: She is alert and oriented to person, place, and time.  Psychiatric:        Mood and Affect: Mood normal.        Behavior: Behavior normal.        Thought Content: Thought content normal.        Judgment: Judgment normal.     Adult ECG Report Not checked  Recent Labs:    Component 11/02/19 09/14/19  Transferrin 267 289  Iron 20Low 377High  Ferritin 18Low 67  TIBC 347 --   UIBC 327 <55Low  % Saturation 6Low --     Ref Range & Units 11/02/19 Comments  WBC 4.4 - 11.0 x 10*3/uL 5.5    RBC 4.10 - 5.10 x 10*6/uL 5.29High    Hgb 12.3 - 15.3 G/DL 12.8  Repeated/Checked  Hct 35.9 - 44.6 % 41.5  Repeated/Checked  MCV 80.0 - 96.0 FL 78.6Low    MCH 27.5 - 33.2 PG 24.2Low    MCHC 33.0 - 37.0 G/DL 30.9Low    (RDW 12.3 - 17.0 % 17.7High    Platelets 150 - 450 X 10*3/uL 155      Component 11/02/19  Sodium 138  Potassium 4.6   Chloride 103  CO2 30  BUN 19  Glucose 111High   Creatinine 1.09  Calcium 9.7  Total Protein 8.3   Albumin  4.1  Total Bilirubin 0.5   Alk Phos 65  AST (SGOT) 17  ALT (SGPT) 10  Anion Gap 5  Est. GFR African American 58Low     Lab Results  Component Value Date   CHOL 91 05/15/2019   HDL 45 05/15/2019   LDLCALC 35 05/15/2019   TRIG 55 05/15/2019   CHOLHDL 2.0 05/15/2019    ASSESSMENT/PLAN    Problem List Items Addressed This Visit    CAD S/P percutaneous coronary angioplasty -- Promus DES 2.5 mm x 20 mm postdilated to 3 mm (Chronic)    Distant history of RCA stent.  Widely patent as of November 2020 cardiac catheterization.  Over the last couple months, pros and cons of aspirin plus Plavix DAPT in the setting of ongoing GI bleed issues were discussed with with Dr. Donzetta Matters (Vascular Surgery) & GI +Heme-Onc, the decision was made to discontinue Plavix (she is far enough out from her stents to avoid being on DAPT -> the main concern was post  CEA).   Remains on Aspirin 81 mg daily monotherapy   Is on beta-blocker, Imdur, high-dose statin plus Zetia.         Relevant Medications   isosorbide mononitrate (IMDUR) 60 MG 24 hr tablet   amLODipine (NORVASC) 5 MG tablet   Other Relevant Orders   AMB Referral to [facility] Pharm-D   History of MI (myocardial infarction)  - NSTEMI with PCI of RCA. (Chronic)    Distant MI with RCA PCI.  Doing well with stable respiratory preserved EF.  She does have distal RCA-PAV disease in the LAD vessel.  Is susceptible to having some ischemic anginal type pain both from this macrovascular as well as microvascular disease..  With no significant angina, reducing Imdur back to 60 mg and restarting amlodipine (better microvascular microvascular benefit)  Coronary artery disease involving native coronary artery of native heart with angina pectoris ([place]) - Primary (Chronic)    Coronary disease as well as PAD and carotid disease.  Thankfully no further angina.  No longer really having any anginal issues.  I suspect her anemia was contributing some to microvascular disease related angina.  She also has a musculoskeletal pains.  As such we do not need the full 120 mg Imdur.   Plan:  Reduce Imdur back down to 60 mg daily  Add amlodipine 5 mg daily  Continue current dose of Toprol (quite high dose)  Continue high-dose atorvastatin plus Zetia.      Relevant Medications   isosorbide mononitrate (IMDUR) 60 MG 24 hr tablet   amLODipine (NORVASC) 5 MG tablet   Other Relevant Orders   AMB Referral to [facility] Pharm-D   Former heavy cigarette smoker (20-39 per day) (Chronic)    Unfortunately, she recently restarted smoking and is now smoking a few cigarettes here and there.  I counseled her on the extreme importance of her complete cessation with CAD, carotid disease as well as PAD --> ~3 min.      Essential hypertension, benign (Chronic)    Blood pressure is quite high today.  At this  point I think we need to reinitiate her amlodipine in addition to her metoprolol.     Plan:   Reduce back to 60 mg Imdur and initiate 5 mg amlodipine -> I suspect that her blood pressure will need additional dosing, but will need close follow-up.  Would also want to consider ARB (which is ARB over a statin because of wheezing), but will start with amlodipine because of antianginal benefit, and we know that she has tolerated in the past.  She is on Lasix, but not daily-could also consider converting from Lasix to HCTZ or chlorthalidone.        Relevant Medications   isosorbide mononitrate (IMDUR) 60 MG 24 hr tablet   amLODipine (NORVASC) 5 MG tablet   PAD (peripheral artery disease) - bilateral CIA & CFA 50-69%; Claudication (Chronic)    History of right CIA PTA.  Being followed now by Dr. Quay Burow -annual follow-up studies have been normal.  No claudication symptoms.  On aggressive CV Risk Factor modification      Relevant Medications   isosorbide mononitrate (IMDUR) 60 MG 24 hr tablet   amLODipine (NORVASC) 5 MG tablet   Dyslipidemia, goal LDL below 70 (Chronic)    On high-dose atorvastatin plus Zetia.  Labs were excellent as of February. Check labs in February (if not checked by PCP) --> we could consider reducing atorvastatin to 40 mg if labs are stable to decreased side effect profile.      Relevant Medications   isosorbide mononitrate (IMDUR) 60 MG 24 hr tablet   amLODipine (NORVASC) 5 MG tablet   Iron deficiency anemia (Chronic)    Evaluated by [place] and hematology oncology.  Has been getting IV iron supplementation.  Last hemoglobin counts are stable.  Because of slow GI bleed, we have stopped Plavix.      Bilateral carotid artery disease (HCC) (Chronic)    Recent bilateral carotid endarterectomy by Dr. Servando Snare.  Sites appear to be healing well.  No bruit.  On aggressive Cardiovascular Risk Factor modification  Per Dr. Donzetta Matters, [place] to d/c Plavix.         Relevant Medications   isosorbide mononitrate (IMDUR) 60 MG 24 hr tablet   amLODipine (NORVASC) 5 MG  tablet   Chronic diastolic CHF (congestive heart failure) (HCC) (Chronic)    Seems euvolemic on exam.  Needs better blood pressure control. May want to consider either changing to daily Lasix or convert to an HCTZ/chlorthalidone in the future based on blood pressure.  Continue high-dose Toprol. For now we will add back amlodipine if additional blood pressure control with low threshold to also consider ARB.       Relevant Medications   isosorbide mononitrate (IMDUR) 60 MG 24 hr tablet   amLODipine (NORVASC) 5 MG tablet   Other Relevant Orders   AMB Referral to [place] Pharm-D     COVID-19 Education: The signs and symptoms of COVID-19 were discussed with the patient and how to seek care for testing (follow up with PCP or arrange E-visit).   The importance of social distancing and COVID-19 vaccination was discussed today.  I spent a total of 28 minutes with the patient in direct patient consultation.  Additional time spent with chart review  / charting (studies, outside notes, etc): 15--additional time spent reviewing outpatient treatment of anemia evaluation by both GI and hematology oncology. Total Time: 15mn   Current medicines are reviewed at length with the patient today.  (+/- concerns) primary  Notice: This dictation was prepared with Dragon dictation along with smaller phrase technology. Any transcriptional errors that result from this process are unintentional and may not be corrected upon review.  Patient Instructions / Medication Changes & Studies & Tests Ordered   Patient Instructions  Medication Instructions:     RESTART TAKING AMLODIPINE 5 MG ONE TABLET DAILY    DECREASE THE DOSE OF ISOSORBIDE MONONITRATE  TO 60 MG   ONE TABLET DAILY    *If you need a refill on your cardiac medications before your next appointment, please call your pharmacy*   Lab  Work: NOT NEEDED   Testing/Procedures: NOT NEEDED   Follow-Up: At [facility] you and your health needs are our priority.  As part of our continuing mission to provide you with exceptional heart care, we have created designated Provider Care Teams.  These Care Teams include your primary Cardiologist (physician) and Advanced Practice Providers (APPs -  Physician Assistants and Nurse Practitioners) who all work together to provide you with the care you need, when you need it.  We recommend signing up for the patient portal called "MyChart".  Sign up information is provided on this After Visit Summary.  MyChart is used to connect with patients for Virtual Visits (Telemedicine).  Patients are able to view lab/test results, encounter notes, upcoming appointments, etc.  Non-urgent messages can be sent to your provider as well.   To learn more about what you can do with MyChart, go to [email or web address]    Your next appointment:   6 month(s)  The format for your next appointment:   In Person  Provider:   DGlenetta Hew MD   Other Instructions You have been referred to [facility] - 1 MONTH FOLLOW UP FOR BLOOD PRESSURE    Studies Ordered:   Orders Placed This Encounter  Procedures  . [facility]     DGlenetta Hew M.D., M.S. Interventional Cardiologist   Pager # [phone] # [phone] SMcGregor Dinosaur [id number]  [facility]

## 2020-01-17 ENCOUNTER — Encounter: Payer: Self-pay | Admitting: Cardiology

## 2020-01-17 NOTE — Assessment & Plan Note (Signed)
Seems euvolemic on exam.  Needs better blood pressure control. May want to consider either changing to daily Lasix or convert to an HCTZ/chlorthalidone in the future based on blood pressure.  Continue high-dose Toprol. For now we will add back amlodipine if additional blood pressure control with low threshold to also consider ARB.

## 2020-01-17 NOTE — Assessment & Plan Note (Addendum)
Blood pressure is quite high today.  At this point I think we need to reinitiate her amlodipine in addition to her metoprolol.     Plan:   Reduce back to 60 mg Imdur and initiate 5 mg amlodipine -> I suspect that her blood pressure will need additional dosing, but will need close follow-up.  Would also want to consider ARB (which is ARB over a statin because of wheezing), but will start with amlodipine because of antianginal benefit, and we know that she has tolerated in the past.  She is on Lasix, but not daily-could also consider converting from Lasix to HCTZ or chlorthalidone.

## 2020-01-17 NOTE — Assessment & Plan Note (Addendum)
Recent bilateral carotid endarterectomy by Dr. Servando Snare.  Sites appear to be healing well.  No bruit.  On aggressive Cardiovascular Risk Factor modification  Per Dr. Donzetta Matters, [place] to d/c Plavix.

## 2020-01-17 NOTE — Assessment & Plan Note (Signed)
Coronary disease as well as PAD and carotid disease.  Thankfully no further angina.  No longer really having any anginal issues.  I suspect her anemia was contributing some to microvascular disease related angina.  She also has a musculoskeletal pains.  As such we do not need the full 120 mg Imdur.   Plan:  Reduce Imdur back down to 60 mg daily  Add amlodipine 5 mg daily  Continue current dose of Toprol (quite high dose)  Continue high-dose atorvastatin plus Zetia.

## 2020-01-17 NOTE — Assessment & Plan Note (Signed)
Distant history of RCA stent.  Widely patent as of November 2020 cardiac catheterization.  Over the last couple months, pros and cons of aspirin plus Plavix DAPT in the setting of ongoing GI bleed issues were discussed with with Dr. Donzetta Matters (Vascular Surgery) & GI +Heme-Onc, the decision was made to discontinue Plavix (she is far enough out from her stents to avoid being on DAPT -> the main concern was post CEA).   Remains on Aspirin 81 mg daily monotherapy   Is on beta-blocker, Imdur, high-dose statin plus Zetia.

## 2020-01-17 NOTE — Assessment & Plan Note (Signed)
Distant MI with RCA PCI.  Doing well with stable respiratory preserved EF.  She does have distal RCA-PAV disease in the LAD vessel.  Is susceptible to having some ischemic anginal type pain both from this macrovascular as well as microvascular disease..  With no significant angina, reducing Imdur back to 60 mg and restarting amlodipine (better microvascular microvascular benefit)

## 2020-01-17 NOTE — Assessment & Plan Note (Signed)
Unfortunately, she recently restarted smoking and is now smoking a few cigarettes here and there.  I counseled her on the extreme importance of her complete cessation with CAD, carotid disease as well as PAD --> ~3 min.

## 2020-01-17 NOTE — Assessment & Plan Note (Signed)
On high-dose atorvastatin plus Zetia.  Labs were excellent as of February. Check labs in February (if not checked by PCP) --> we could consider reducing atorvastatin to 40 mg if labs are stable to decreased side effect profile.

## 2020-01-17 NOTE — Assessment & Plan Note (Signed)
History of right CIA PTA.  Being followed now by Dr. Quay Burow -annual follow-up studies have been normal.  No claudication symptoms.  On aggressive CV Risk Factor modification

## 2020-01-17 NOTE — Assessment & Plan Note (Signed)
Evaluated by [place] and hematology oncology.  Has been getting IV iron supplementation.  Last hemoglobin counts are stable.  Because of slow GI bleed, we have stopped Plavix.

## 2020-02-02 ENCOUNTER — Other Ambulatory Visit: Payer: Self-pay | Admitting: Family Medicine

## 2020-02-02 DIAGNOSIS — Z1231 Encounter for screening mammogram for malignant neoplasm of breast: Secondary | ICD-10-CM

## 2020-02-08 ENCOUNTER — Telehealth: Payer: Self-pay | Admitting: Acute Care

## 2020-02-08 DIAGNOSIS — F17210 Nicotine dependence, cigarettes, uncomplicated: Secondary | ICD-10-CM

## 2020-02-08 DIAGNOSIS — Z87891 Personal history of nicotine dependence: Secondary | ICD-10-CM

## 2020-02-08 NOTE — Telephone Encounter (Signed)
Spoke with pt and scheduled SDMV 03/02/20 10:00 CT ordered Nothing further needed

## 2020-02-11 ENCOUNTER — Ambulatory Visit (INDEPENDENT_AMBULATORY_CARE_PROVIDER_SITE_OTHER): Payer: Medicare Other | Admitting: Pharmacist

## 2020-02-11 ENCOUNTER — Other Ambulatory Visit: Payer: Self-pay

## 2020-02-11 VITALS — BP 132/74 | HR 64 | Resp 14 | Ht 59.0 in | Wt 136.0 lb

## 2020-02-11 DIAGNOSIS — I10 Essential (primary) hypertension: Secondary | ICD-10-CM

## 2020-02-11 DIAGNOSIS — N182 Chronic kidney disease, stage 2 (mild): Secondary | ICD-10-CM

## 2020-02-11 DIAGNOSIS — I5032 Chronic diastolic (congestive) heart failure: Secondary | ICD-10-CM | POA: Diagnosis not present

## 2020-02-11 LAB — BASIC METABOLIC PANEL
BUN/Creatinine Ratio: 19 (ref 12–28)
BUN: 26 mg/dL (ref 8–27)
CO2: 22 mmol/L (ref 20–29)
Calcium: 9.3 mg/dL (ref 8.7–10.3)
Chloride: 103 mmol/L (ref 96–106)
Creatinine, Ser: 1.34 mg/dL — ABNORMAL HIGH (ref 0.57–1.00)
GFR calc Af Amer: 45 mL/min/{1.73_m2} — ABNORMAL LOW (ref >59)
GFR calc non Af Amer: 39 mL/min/{1.73_m2} — ABNORMAL LOW (ref >59)
Glucose: 79 mg/dL (ref 65–99)
Potassium: 5 mmol/L (ref 3.5–5.2)
Sodium: 143 mmol/L (ref 134–144)

## 2020-02-11 NOTE — Patient Instructions (Addendum)
Return for a  follow up appointment in 4 weeks  Check your blood pressure at home daily (if able) and keep record of the readings.  Take your BP meds as follows: *NO MEDICATION CHANGE*  Bring all of your meds, your BP cuff and your record of home blood pressures to your next appointment.  Exercise as you're able, try to walk approximately 30 minutes per day.  Keep salt intake to a minimum, especially watch canned and prepared boxed foods.  Eat more fresh fruits and vegetables and fewer canned items.  Avoid eating in fast food restaurants.    HOW TO TAKE YOUR BLOOD PRESSURE: . Rest 5 minutes before taking your blood pressure. .  Don't smoke or drink caffeinated beverages for at least 30 minutes before. . Take your blood pressure before (not after) you eat. . Sit comfortably with your back supported and both feet on the floor (don't cross your legs). . Elevate your arm to heart level on a table or a desk. . Use the proper sized cuff. It should fit smoothly and snugly around your bare upper arm. There should be enough room to slip a fingertip under the cuff. The bottom edge of the cuff should be 1 inch above the crease of the elbow. . Ideally, take 3 measurements at one sitting and record the average.    

## 2020-02-11 NOTE — Progress Notes (Signed)
Patient ID: Kayla Mcclure                 DOB: 1946-02-26                      MRN: [id number]     HPI: Kayla Mcclure is a 74 y.o. female referred by Dr. Ellyn Mcclure to HTN clinic. PMH includes asthma, CAD s/p NSTEMI, chronic back pain, CKD-II, hypertension, PAD, and stroke. Per Dr Kayla Mcclure note "Would also want to consider ARB, but will start with amlodipine because of antianginal benefit, and we know that she has tolerated in the past"...  Patient presents today if good spirit and denies problems with current therapy. She report compliance with medication ans well.  Current HTN meds:  Amlodipine 5mg  daily Metoprolol succinate 200mg  daily Isosorbide mononitrate 60mg  daily Furosemide 20 mg 3x/week and 20mg  as needed  Previously tried:  Hydralazine 50mg  twice daily Losartan  Clonidine 0.1mg    BP goal: <130/80  Family History: Breast cancer (age of onset: 34) in her sister; Breast cancer (age of onset: 37) in her mother; Colon cancer (age of onset: 51) in her mother; Diabetes in her mother and sister; Heart attack (age of onset: 73) in her father; Heart attack (age of onset: 54) in her mother  Social History: Former smoker, occasional alcohol intake (~ 3x per year)  Exercise: activities of daily living  Home BP readings: none provided  Wt Readings from Last 3 Encounters:  02/11/20 136 lb (61.7 kg)  01/11/20 135 lb 9.6 oz (61.5 kg)  09/18/19 139 lb (63 kg)   BP Readings from Last 3 Encounters:  02/11/20 132/74  01/11/20 (!) 180/90  09/18/19 (!) 149/77   Pulse Readings from Last 3 Encounters:  02/11/20 64  01/11/20 78  09/18/19 96    Past Medical History:  Diagnosis Date  . Acute GI bleeding 08/07/2019  . Asthma  10/31/2007  . CAD (coronary artery disease) 08/05/2007   a) NSTEMI 2009: PCI to the RCA Promus DES 2.5 mm x 23 mm (3.0 mm). b) Myoview June 2017: Nonspecific ST changes. EF greater than 65%. LOW RISK, normal study. No ischemia or infarction.  .  Carotid artery disease [facility])    s/p Bilateral CEA March & May 2020.   Marland Kitchen Chronic back pain   . Chronic diastolic heart failure ([place])   . CKD (chronic kidney disease), stage II   . Diverticulosis   04/11/2010    Colonoscopy  . Dyslipidemia, goal LDL below 70   . Essential hypertension    a. Normal renal arteries by PV angio 05/2013.  Marland Kitchen GERD (gastroesophageal reflux disease)   . GI bleed 12/2015  . History of kidney stones 1975  . History of recent blood transfusion 12/2015   12-19-15, 12-20-15 and 12-21-15  . Insomnia   . Internal hemorrhoids  04/11/2010   By colonoscopy  . Iron deficiency anemia   . Myocardial infarction ([place]) 2009  . Peripheral arterial disease ([place])    a. s/p PTA and stenting of L CIA stenosis 05/2013.  Marland Kitchen Renal insufficiency   . Stroke [facility])    mini stroke  . Tobacco abuse    Quit in 05/2013    Current Outpatient Medications on File Prior to Visit  Medication Sig Dispense Refill  . albuterol (VENTOLIN HFA) 108 (90 Base) MCG/ACT inhaler Inhale 2 puffs into the lungs every 6 (six) hours as needed for wheezing or shortness of breath.    Marland Kitchen amLODipine (  NORVASC) 5 MG tablet Take 1 tablet (5 mg total) by mouth daily. 90 tablet 3  . aspirin EC 81 MG tablet Take 81 mg by mouth daily.    Marland Kitchen atorvastatin (LIPITOR) 80 MG tablet Take 1 tablet (80 mg total) by mouth daily. 90 tablet 3  . ezetimibe (ZETIA) 10 MG tablet Take 1 tablet (10 mg total) by mouth daily. 90 tablet 3  . ferrous gluconate (FERGON) 324 MG tablet Take 1 tablet (324 mg total) by mouth 2 (two) times daily with a meal. 60 tablet 3  . fluticasone furoate-vilanterol (BREO ELLIPTA) 100-25 MCG/INH AEPB Inhale 1 puff into the lungs daily.    . furosemide (LASIX) 20 MG tablet Take 20 mg  One tablet on Monday , Wednesday , Fridays , may take an additional  20 mg ff needed 90 tablet 3  . isosorbide mononitrate (IMDUR) 60 MG 24 hr tablet Take 1 tablet (60 mg total) by mouth daily. 60 tablet 11  . metoprolol succinate  (TOPROL-XL) 100 MG 24 hr tablet Take 2 tablets (200 mg total) by mouth at bedtime. 180 tablet 3  . nitroGLYCERIN (NITROSTAT) 0.4 MG SL tablet Place 1 tablet (0.4 mg total) under the tongue every 5 (five) minutes as needed for chest pain. 25 tablet 6  . ondansetron (ZOFRAN) 4 MG tablet Take 1 tablet (4 mg total) by mouth every 6 (six) hours as needed for nausea. 20 tablet 0  . pantoprazole (PROTONIX) 40 MG tablet Take 1 tablet (40 mg total) by mouth daily. 30 tablet 0   No current facility-administered medications on file prior to visit.    No Known Allergies  Blood pressure 132/74, pulse 64, resp. rate 14, height 4\' 11"  (1.499 m), weight 136 lb (61.7 kg), SpO2 94 %.  Essential hypertension, benign Blood pressure remains above goal but greatly improved after initiating amlodipine therapy. Patient is to work on positive lifestyle modifications and keep records of home BP readings.   Will repeat BMET today, continue all current medication as prescribed, follow up in 4 weeks, and initiate ACE/ARB during next OV if renal function remain stable.   Kayla Mcclure PharmD, BCPS, [place] [address] 02/18/2020 3:41 PM

## 2020-02-18 NOTE — Assessment & Plan Note (Signed)
Blood pressure remains above goal but greatly improved after initiating amlodipine therapy. Patient is to work on positive lifestyle modifications and keep records of home BP readings.   Will repeat BMET today, continue all current medication as prescribed, follow up in 4 weeks, and initiate ACE/ARB during next OV if renal function remain stable.

## 2020-03-02 ENCOUNTER — Other Ambulatory Visit: Payer: Self-pay

## 2020-03-02 ENCOUNTER — Encounter: Payer: Self-pay | Admitting: Acute Care

## 2020-03-02 ENCOUNTER — Ambulatory Visit (INDEPENDENT_AMBULATORY_CARE_PROVIDER_SITE_OTHER): Payer: Medicare Other | Admitting: Acute Care

## 2020-03-02 ENCOUNTER — Ambulatory Visit (INDEPENDENT_AMBULATORY_CARE_PROVIDER_SITE_OTHER)
Admission: RE | Admit: 2020-03-02 | Discharge: 2020-03-02 | Disposition: A | Discharge status: Home / Self Care | Payer: Medicare Other | Source: Ambulatory Visit | Attending: Acute Care | Admitting: Acute Care

## 2020-03-02 VITALS — BP 124/70 | HR 75 | Temp 97.5°F | Ht 59.0 in | Wt 134.0 lb

## 2020-03-02 DIAGNOSIS — Z87891 Personal history of nicotine dependence: Secondary | ICD-10-CM

## 2020-03-02 DIAGNOSIS — F17210 Nicotine dependence, cigarettes, uncomplicated: Secondary | ICD-10-CM | POA: Diagnosis not present

## 2020-03-02 DIAGNOSIS — Z122 Encounter for screening for malignant neoplasm of respiratory organs: Secondary | ICD-10-CM

## 2020-03-02 NOTE — Progress Notes (Signed)
Shared Decision Making Visit Lung Cancer Screening Program [phone])   Eligibility:  Age 74 y.o.  Pack Years Smoking History Calculation 30 pack year smoking history (# packs/per year x # years smoked)  Recent History of coughing up blood  no  Unexplained weight loss? no ( >Than 15 pounds within the last 6 months )  Prior History Lung / other cancer no (Diagnosis within the last 5 years already requiring surveillance chest CT Scans).  Smoking Status Current Smoker  Former Smokers: Years since quit: NA  Quit Date: NA  Visit Components:  Discussion included one or more decision making aids. yes  Discussion included risk/benefits of screening. yes  Discussion included potential follow up diagnostic testing for abnormal scans. yes  Discussion included meaning and risk of over diagnosis. yes  Discussion included meaning and risk of False Positives. yes  Discussion included meaning of total radiation exposure. yes  Counseling Included:  Importance of adherence to annual lung cancer LDCT screening. yes  Impact of comorbidities on ability to participate in the program. yes  Ability and willingness to under diagnostic treatment. yes  Smoking Cessation Counseling:  Current Smokers:   Discussed importance of smoking cessation. yes  Information about tobacco cessation classes and interventions provided to patient. yes  Patient provided with "ticket" for LDCT Scan. yes  Symptomatic Patient. no  Counseling  Diagnosis Code: Tobacco Use Z72.0  Asymptomatic Patient yes  Counseling (Intermediate counseling: > three minutes counseling) E2800  Former Smokers:   Discussed the importance of maintaining cigarette abstinence. yes  Diagnosis Code: Personal History of Nicotine Dependence. L49.179  Information about tobacco cessation classes and interventions provided to patient. Yes  Patient provided with "ticket" for LDCT Scan. yes  Written Order for Lung Cancer  Screening with LDCT placed in Epic. Yes (CT Chest Lung Cancer Screening Low Dose W/O CM) [id number] Z12.2-Screening of respiratory organs Z87.891-Personal history of nicotine dependence  BP 124/70 (BP Location: Left Arm, Cuff Size: Normal)   Pulse 75   Temp (!) 97.5 F (36.4 C) (Oral)   Ht 4\' 11"  (1.499 m)   Wt 134 lb (60.8 kg)   SpO2 96%   BMI 27.06 kg/m    I have spent 25 minutes of face to face time with Kayla Mcclure discussing the risks and benefits of lung cancer screening. We viewed a power point together that explained in detail the above noted topics. We paused at intervals to allow for questions to be asked and answered to ensure understanding.We discussed that the single most powerful action that she can take to decrease her risk of developing lung cancer is to quit smoking. We discussed whether or not she is ready to commit to setting a quit date. We discussed options for tools to aid in quitting smoking including nicotine replacement therapy, non-nicotine medications, support groups, Quit Smart classes, and behavior modification. We discussed that often times setting smaller, more achievable goals, such as eliminating 1 cigarette a day for a week and then 2 cigarettes a day for a week can be helpful in slowly decreasing the number of cigarettes smoked. This allows for a sense of accomplishment as well as providing a clinical benefit. I gave her the " Be Stronger Than Your Excuses" card with contact information for community resources, classes, free nicotine replacement therapy, and access to mobile apps, text messaging, and on-line smoking cessation help. I have also given her my card and contact information in the event she needs to contact me. We discussed the time  and location of the scan, and that either Doroteo Glassman RN or I will call with the results within 24-48 hours of receiving them. I have offered her  a copy of the power point we viewed  as a resource in the event they need  reinforcement of the concepts we discussed today in the office. The patient verbalized understanding of all of  the above and had no further questions upon leaving the office. They have my contact information in the event they have any further questions.  I spent 3 minutes counseling on smoking cessation and the health risks of continued tobacco abuse.  I explained to the patient that there has been a high incidence of coronary artery disease noted on these exams. I explained that this is a non-gated exam therefore degree or severity cannot be determined. This patient is currently on statin therapy. I have asked the patient to follow-up with their PCP regarding any incidental finding of coronary artery disease and management with diet or medication as their PCP  feels is clinically indicated. The patient verbalized understanding of the above and had no further questions upon completion of the visit.      Magdalen Spatz, NP 03/02/2020 10:34 AM

## 2020-03-02 NOTE — Patient Instructions (Signed)
Thank you for participating in the [place] [facility] Cancer Screening Program. It was our pleasure to meet you today. We will call you with the results of your scan within the next few days. Your scan will be assigned a Lung RADS category score by the physicians reading the scans.  This Lung RADS score determines follow up scanning.  See below for description of categories, and follow up screening recommendations. We will be in touch to schedule your follow up screening annually or based on recommendations of our providers. We will fax a copy of your scan results to your Primary Care Physician, or the physician who referred you to the program, to ensure they have the results. Please call the office if you have any questions or concerns regarding your scanning experience or results.  Our office number is [phone]. Please speak with Denise Phelps, RN. She is our Lung Cancer Screening RN. If she is unavailable when you call, please have the office staff send her a message. She will return your call at her earliest convenience. Remember, if your scan is normal, we will scan you annually as long as you continue to meet the criteria for the program. (Age 55-77, Current smoker or smoker who has quit within the last 15 years). If you are a smoker, remember, quitting is the single most powerful action that you can take to decrease your risk of lung cancer and other pulmonary, breathing related problems. We know quitting is hard, and we are here to help.  Please let us know if there is anything we can do to help you meet your goal of quitting. If you are a former smoker, congratulations. We are proud of you! Remain smoke free! Remember you can refer friends or family members through the number above.  We will screen them to make sure they meet criteria for the program. Thank you for helping us take better care of you by participating in Lung Screening.  Lung RADS Categories:  Lung RADS 1: no nodules  or definitely non-concerning nodules.  Recommendation is for a repeat annual scan in 12 months.  Lung RADS 2:  nodules that are non-concerning in appearance and behavior with a very low likelihood of becoming an active cancer. Recommendation is for a repeat annual scan in 12 months.  Lung RADS 3: nodules that are probably non-concerning , includes nodules with a low likelihood of becoming an active cancer.  Recommendation is for a 6-month repeat screening scan. Often noted after an upper respiratory illness. We will be in touch to make sure you have no questions, and to schedule your 6-month scan.  Lung RADS 4 A: nodules with concerning findings, recommendation is most often for a follow up scan in 3 months or additional testing based on our provider's assessment of the scan. We will be in touch to make sure you have no questions and to schedule the recommended 3 month follow up scan.  Lung RADS 4 B:  indicates findings that are concerning. We will be in touch with you to schedule additional diagnostic testing based on our provider's  assessment of the scan.   

## 2020-03-04 ENCOUNTER — Telehealth: Payer: Self-pay | Admitting: *Deleted

## 2020-03-04 NOTE — Telephone Encounter (Signed)
Kayla Mcclure is returning [employer]. Please advise.

## 2020-03-04 NOTE — Telephone Encounter (Signed)
Left message with husband to have patient to cal back for results

## 2020-03-04 NOTE — Telephone Encounter (Signed)
-----   Message from Leonie Man, MD sent at 02/14/2020  1:55 PM EST ----- Essentially stable chemistries.  Kidney function is the same as it was 5 months ago-mildly reduced function, but as long as she is making urine, this is not the level of which would be concerned.  She bounces up and down from low normal to just below that.  We will continue to monitor  Other labs look good.  No need to change medications.  [name], MD

## 2020-03-04 NOTE — Telephone Encounter (Signed)
Patient is returning call to discus results.

## 2020-03-07 NOTE — Progress Notes (Signed)
Please call patient and let them  know their  low dose Ct was read as a Lung RADS 1, negative study: no nodules or definitely benign nodules. Radiology recommendation is for a repeat LDCT in 12 months. .Please let them  know we will order and schedule their  annual screening scan for 03/2021. Please let them  know there was notation of CAD on their  scan.  Please remind the patient  that this is a non-gated exam therefore degree or severity of disease  cannot be determined. Please have them  follow up with their PCP regarding potential risk factor modification, dietary therapy or pharmacologic therapy if clinically indicated. Pt.  is  currently on statin therapy. Please place order for annual  screening scan for 03/2021 and fax results to PCP. Thanks so much.  Pt is followed by cardiology

## 2020-03-09 NOTE — Telephone Encounter (Signed)
Spoke to patient on 03/04/20 . Result givent

## 2020-03-10 ENCOUNTER — Other Ambulatory Visit: Payer: Self-pay | Admitting: *Deleted

## 2020-03-10 ENCOUNTER — Ambulatory Visit
Admission: RE | Admit: 2020-03-10 | Discharge: 2020-03-10 | Disposition: A | Discharge status: Home / Self Care | Payer: Medicare Other | Source: Ambulatory Visit | Attending: Family Medicine | Admitting: Family Medicine

## 2020-03-10 ENCOUNTER — Other Ambulatory Visit: Payer: Self-pay

## 2020-03-10 DIAGNOSIS — Z87891 Personal history of nicotine dependence: Secondary | ICD-10-CM

## 2020-03-10 DIAGNOSIS — Z1231 Encounter for screening mammogram for malignant neoplasm of breast: Secondary | ICD-10-CM

## 2020-03-10 DIAGNOSIS — F17210 Nicotine dependence, cigarettes, uncomplicated: Secondary | ICD-10-CM

## 2020-03-15 ENCOUNTER — Ambulatory Visit (INDEPENDENT_AMBULATORY_CARE_PROVIDER_SITE_OTHER): Payer: Medicare Other | Admitting: Pharmacist

## 2020-03-15 ENCOUNTER — Other Ambulatory Visit: Payer: Self-pay

## 2020-03-15 VITALS — BP 144/62 | HR 66 | Ht 59.0 in

## 2020-03-15 DIAGNOSIS — I10 Essential (primary) hypertension: Secondary | ICD-10-CM

## 2020-03-15 MED ORDER — VALSARTAN 40 MG PO TABS: 40.0000 mg | ORAL_TABLET | Freq: Every day | ORAL | 1 refills | Status: DC

## 2020-03-15 NOTE — Progress Notes (Signed)
Patient ID: Kayla Mcclure                 DOB: July 22, 1945                      MRN: [id number]     HPI: Kayla Mcclure is a 74 y.o. female referred by Dr. Ellyn Hack to HTN clinic. PMH includes asthma, CAD s/p NSTEMI, chronic back pain, CKD-III, hypertension, PAD, and stroke. Per Dr Allison Quarry note "Would also want to consider ARB, but will start with amlodipine because of antianginal benefit, and we know that she has tolerated in the past"  Patient presents today if good spirit and denies problems with current therapy. She report compliance with medication as well. We discussed plan to add a new medication to manage her BP and provide renal protection, and cardiac benefits.  Current HTN meds:  Amlodipine 5mg  daily Metoprolol succinate 200mg  daily Isosorbide mononitrate 60mg  daily Furosemide 20 mg 3x/week and 20mg  as needed  Previously tried:  Hydralazine 50mg  twice daily Losartan  Clonidine 0.1mg    BP goal: <130/80  Family History: Breast cancer (age of onset: 48) in her sister; Breast cancer (age of onset: 35) in her mother; Colon cancer (age of onset: 47) in her mother; Diabetes in her mother and sister; Heart attack (age of onset: 36) in her father; Heart attack (age of onset: 35) in her mother  Social History: Former smoker, occasional alcohol intake (~ 3x per year)  Exercise: activities of daily living  Home BP readings:  14 morning readings, average 161/78, NO HR REPORTED 14 evening readings,average 152/75, NO HR REPORTED  Wt Readings from Last 3 Encounters:  03/02/20 134 lb (60.8 kg)  02/11/20 136 lb (61.7 kg)  01/11/20 135 lb 9.6 oz (61.5 kg)   BP Readings from Last 3 Encounters:  03/15/20 (!) 144/62  03/02/20 124/70  02/11/20 132/74   Pulse Readings from Last 3 Encounters:  03/15/20 66  03/02/20 75  02/11/20 64    Past Medical History:  Diagnosis Date  . Acute GI bleeding 08/07/2019  . Asthma  10/31/2007  . CAD (coronary artery disease) 08/05/2007    a) NSTEMI 2009: PCI to the RCA Promus DES 2.5 mm x 23 mm (3.0 mm). b) Myoview June 2017: Nonspecific ST changes. EF greater than 65%. LOW RISK, normal study. No ischemia or infarction.  . Carotid artery disease [facility])    s/p Bilateral CEA March & May 2020.   Marland Kitchen Chronic back pain   . Chronic diastolic heart failure ([place])   . CKD (chronic kidney disease), stage II   . Diverticulosis   04/11/2010    Colonoscopy  . Dyslipidemia, goal LDL below 70   . Essential hypertension    a. Normal renal arteries by PV angio 05/2013.  Marland Kitchen GERD (gastroesophageal reflux disease)   . GI bleed 12/2015  . History of kidney stones 1975  . History of recent blood transfusion 12/2015   12-19-15, 12-20-15 and 12-21-15  . Insomnia   . Internal hemorrhoids  04/11/2010   By colonoscopy  . Iron deficiency anemia   . Myocardial infarction ([place]) 2009  . Peripheral arterial disease ([place])    a. s/p PTA and stenting of L CIA stenosis 05/2013.  Marland Kitchen Renal insufficiency   . Stroke [facility])    mini stroke  . Tobacco abuse    Quit in 05/2013    Current Outpatient Medications on File Prior to Visit  Medication Sig Dispense Refill  .  albuterol (VENTOLIN HFA) 108 (90 Base) MCG/ACT inhaler Inhale 2 puffs into the lungs every 6 (six) hours as needed for wheezing or shortness of breath.    Marland Kitchen amLODipine (NORVASC) 5 MG tablet Take 1 tablet (5 mg total) by mouth daily. 90 tablet 3  . aspirin EC 81 MG tablet Take 81 mg by mouth daily.    Marland Kitchen atorvastatin (LIPITOR) 80 MG tablet Take 1 tablet (80 mg total) by mouth daily. 90 tablet 3  . ezetimibe (ZETIA) 10 MG tablet Take 1 tablet (10 mg total) by mouth daily. 90 tablet 3  . ferrous gluconate (FERGON) 324 MG tablet Take 1 tablet (324 mg total) by mouth 2 (two) times daily with a meal. 60 tablet 3  . furosemide (LASIX) 20 MG tablet Take 20 mg  One tablet on Monday , Wednesday , Fridays , may take an additional  20 mg ff needed 90 tablet 3  . isosorbide mononitrate (IMDUR) 60 MG 24 hr tablet  Take 1 tablet (60 mg total) by mouth daily. 60 tablet 11  . metoprolol succinate (TOPROL-XL) 100 MG 24 hr tablet Take 2 tablets (200 mg total) by mouth at bedtime. 180 tablet 3  . nitroGLYCERIN (NITROSTAT) 0.4 MG SL tablet Place 1 tablet (0.4 mg total) under the tongue every 5 (five) minutes as needed for chest pain. 25 tablet 6  . ondansetron (ZOFRAN) 4 MG tablet Take 1 tablet (4 mg total) by mouth every 6 (six) hours as needed for nausea. 20 tablet 0  . pantoprazole (PROTONIX) 40 MG tablet Take 1 tablet (40 mg total) by mouth daily. 30 tablet 0   No current facility-administered medications on file prior to visit.    No Known Allergies  Blood pressure (!) 144/62, pulse 66, height 4\' 11"  (1.499 m), SpO2 94 %.  Essential hypertension, benign Blood pressure remains above goal and patient is a good candidate for ACEI/ARB therapy. Will initiate valsartan at low dose and repeat BMET in 3 weeks. Plan to titrate further is renal function remains stable. Patient is also interested in stress management and agrees on talking to Care Guide ,Kayla Mcclure, to discuss strategies for stress management. 5 weeks follow up also scheduled.  Kayla Mcclure PharmD, BCPS, [place] [address] [place], [address] 03/24/2020 1:21 PM

## 2020-03-15 NOTE — Patient Instructions (Addendum)
Return for a follow up appointment in 5 weeks  Go to the lab in 3 weeks  Check your blood pressure at home daily (if able) and keep record of the readings.  Take your BP meds as follows: *START taking valsartan 40mg  daily*   *Amy Truman Hayward - will call to discuss options for stress management  Bring all of your meds, your BP cuff and your record of home blood pressures to your next appointment.  Exercise as you're able, try to walk approximately 30 minutes per day.  Keep salt intake to a minimum, especially watch canned and prepared boxed foods.  Eat more fresh fruits and vegetables and fewer canned items.  Avoid eating in fast food restaurants.    HOW TO TAKE YOUR BLOOD PRESSURE: . Rest 5 minutes before taking your blood pressure. .  Don't smoke or drink caffeinated beverages for at least 30 minutes before. . Take your blood pressure before (not after) you eat. . Sit comfortably with your back supported and both feet on the floor (don't cross your legs). . Elevate your arm to heart level on a table or a desk. . Use the proper sized cuff. It should fit smoothly and snugly around your bare upper arm. There should be enough room to slip a fingertip under the cuff. The bottom edge of the cuff should be 1 inch above the crease of the elbow. . Ideally, take 3 measurements at one sitting and record the average.

## 2020-03-24 ENCOUNTER — Encounter: Payer: Self-pay | Admitting: Pharmacist

## 2020-03-24 NOTE — Assessment & Plan Note (Addendum)
Blood pressure remains above goal and patient is a good candidate for ACEI/ARB therapy. Will initiate valsartan at low dose and repeat BMET in 3 weeks. Plan to titrate further is renal function remains stable. Patient is also interested in stress management and agrees on talking to Care Guide ,Avelino Leeds, to discuss strategies for stress management. 5 weeks follow up also scheduled.

## 2020-03-28 ENCOUNTER — Telehealth: Payer: Self-pay

## 2020-03-28 DIAGNOSIS — Z0000 Encounter for general adult medical examination without abnormal findings: Secondary | ICD-10-CM

## 2020-03-28 NOTE — Telephone Encounter (Signed)
Called patient to discuss health coaching for stress management. Patient is interested in an initial health coaching session and has been scheduled for 04/20/20 at 10:00am.

## 2020-04-15 ENCOUNTER — Telehealth: Payer: Self-pay | Admitting: Cardiology

## 2020-04-15 ENCOUNTER — Other Ambulatory Visit: Payer: Self-pay

## 2020-04-15 MED ORDER — FUROSEMIDE 20 MG PO TABS: ORAL_TABLET | ORAL | 0 refills | Status: DC

## 2020-04-15 NOTE — Telephone Encounter (Signed)
Prescription refilled.

## 2020-04-15 NOTE — Telephone Encounter (Signed)
°*  STAT* If patient is at the pharmacy, call can be transferred to refill team.   1. Which medications need to be refilled? (please list name of each medication and dose if known)   furosemide (LASIX) 20 MG tablet   2. Which pharmacy/location (including street and city if local pharmacy) is medication to be sent to? [place] ([place]), [place] - [address]  3. Do they need a 30 day or 90 day supply? 90 day

## 2020-04-20 ENCOUNTER — Ambulatory Visit: Payer: Medicare Other

## 2020-05-22 ENCOUNTER — Other Ambulatory Visit: Payer: Self-pay | Admitting: Pharmacist

## 2020-05-23 NOTE — Telephone Encounter (Signed)
This is Dr. Harding's pt. °

## 2020-05-27 DIAGNOSIS — J4530 Mild persistent asthma, uncomplicated: Secondary | ICD-10-CM | POA: Diagnosis not present

## 2020-05-27 DIAGNOSIS — D509 Iron deficiency anemia, unspecified: Secondary | ICD-10-CM | POA: Diagnosis not present

## 2020-05-27 DIAGNOSIS — I5032 Chronic diastolic (congestive) heart failure: Secondary | ICD-10-CM | POA: Diagnosis not present

## 2020-05-27 DIAGNOSIS — I10 Essential (primary) hypertension: Secondary | ICD-10-CM | POA: Diagnosis not present

## 2020-05-27 DIAGNOSIS — E785 Hyperlipidemia, unspecified: Secondary | ICD-10-CM | POA: Diagnosis not present

## 2020-05-27 DIAGNOSIS — I129 Hypertensive chronic kidney disease with stage 1 through stage 4 chronic kidney disease, or unspecified chronic kidney disease: Secondary | ICD-10-CM | POA: Diagnosis not present

## 2020-05-27 DIAGNOSIS — I25119 Atherosclerotic heart disease of native coronary artery with unspecified angina pectoris: Secondary | ICD-10-CM | POA: Diagnosis not present

## 2020-05-27 DIAGNOSIS — J449 Chronic obstructive pulmonary disease, unspecified: Secondary | ICD-10-CM | POA: Diagnosis not present

## 2020-05-27 DIAGNOSIS — K219 Gastro-esophageal reflux disease without esophagitis: Secondary | ICD-10-CM | POA: Diagnosis not present

## 2020-05-27 DIAGNOSIS — N2581 Secondary hyperparathyroidism of renal origin: Secondary | ICD-10-CM | POA: Diagnosis not present

## 2020-05-27 DIAGNOSIS — M8580 Other specified disorders of bone density and structure, unspecified site: Secondary | ICD-10-CM | POA: Diagnosis not present

## 2020-06-13 DIAGNOSIS — J449 Chronic obstructive pulmonary disease, unspecified: Secondary | ICD-10-CM | POA: Diagnosis not present

## 2020-06-13 DIAGNOSIS — N1831 Chronic kidney disease, stage 3a: Secondary | ICD-10-CM | POA: Diagnosis not present

## 2020-06-13 DIAGNOSIS — I129 Hypertensive chronic kidney disease with stage 1 through stage 4 chronic kidney disease, or unspecified chronic kidney disease: Secondary | ICD-10-CM | POA: Diagnosis not present

## 2020-06-13 DIAGNOSIS — N2581 Secondary hyperparathyroidism of renal origin: Secondary | ICD-10-CM | POA: Diagnosis not present

## 2020-06-13 DIAGNOSIS — K219 Gastro-esophageal reflux disease without esophagitis: Secondary | ICD-10-CM | POA: Diagnosis not present

## 2020-06-13 DIAGNOSIS — D509 Iron deficiency anemia, unspecified: Secondary | ICD-10-CM | POA: Diagnosis not present

## 2020-06-13 DIAGNOSIS — E785 Hyperlipidemia, unspecified: Secondary | ICD-10-CM | POA: Diagnosis not present

## 2020-06-13 DIAGNOSIS — I2510 Atherosclerotic heart disease of native coronary artery without angina pectoris: Secondary | ICD-10-CM | POA: Diagnosis not present

## 2020-06-13 DIAGNOSIS — I25119 Atherosclerotic heart disease of native coronary artery with unspecified angina pectoris: Secondary | ICD-10-CM | POA: Diagnosis not present

## 2020-06-13 DIAGNOSIS — I5032 Chronic diastolic (congestive) heart failure: Secondary | ICD-10-CM | POA: Diagnosis not present

## 2020-06-13 DIAGNOSIS — I10 Essential (primary) hypertension: Secondary | ICD-10-CM | POA: Diagnosis not present

## 2020-06-20 ENCOUNTER — Ambulatory Visit (HOSPITAL_COMMUNITY)
Admission: RE | Admit: 2020-06-20 | Discharge: 2020-06-20 | Disposition: A | Discharge status: Home / Self Care | Payer: Medicare Other | Source: Ambulatory Visit | Attending: Surgery | Admitting: Surgery

## 2020-06-20 ENCOUNTER — Ambulatory Visit: Payer: Medicare Other | Admitting: Physician Assistant

## 2020-06-20 ENCOUNTER — Other Ambulatory Visit: Payer: Self-pay

## 2020-06-20 VITALS — BP 131/69 | HR 61 | Temp 97.5°F | Resp 14 | Ht 59.0 in | Wt 130.0 lb

## 2020-06-20 DIAGNOSIS — I6523 Occlusion and stenosis of bilateral carotid arteries: Secondary | ICD-10-CM | POA: Insufficient documentation

## 2020-06-20 NOTE — Progress Notes (Signed)
Office Note     CC:  follow up Requesting Provider:  [name], MD  HPI: Kayla Mcclure is a 75 y.o. ([birth year]) female who presents for annual follow-up of carotid stenosis.  She underwent right carotid endarterectomy with patch angioplasty on Aug 27, 2019 by Dr. Donzetta Matters.  She had progressed to greater than 80% stenosis on the right internal carotid artery.  She has a prior history of symptomatic left ICA lesion and is status post left carotid endarterectomy on July 28, 2019.  At that time, she presented with a left-sided TIA resulting in slurred speech and right arm numbness.  She tells me that she has had occasional slurred speech since her initial TIA.  Had no new or concerning symptoms.  Denies monocular blindness, facial drooping extremity numbness or weakness.  She denies claudication.  Compliant with aspirin, statin, Zetia. Not diabetic. + cigarettes (sporadic, not daily)  Past Medical History:  Diagnosis Date  . Acute GI bleeding 08/07/2019  . Asthma  10/31/2007  . CAD (coronary artery disease) 08/05/2007   a) NSTEMI 2009: PCI to the RCA Promus DES 2.5 mm x 23 mm (3.0 mm). b) Myoview June 2017: Nonspecific ST changes. EF greater than 65%. LOW RISK, normal study. No ischemia or infarction.  . Carotid artery disease [facility])    s/p Bilateral CEA March & May 2020.   Marland Kitchen Chronic back pain   . Chronic diastolic heart failure ([place])   . CKD (chronic kidney disease), stage II   . Diverticulosis   04/11/2010    Colonoscopy  . Dyslipidemia, goal LDL below 70   . Essential hypertension    a. Normal renal arteries by PV angio 05/2013.  Marland Kitchen GERD (gastroesophageal reflux disease)   . GI bleed 12/2015  . History of kidney stones 1975  . History of recent blood transfusion 12/2015   12-19-15, 12-20-15 and 12-21-15  . Insomnia   . Internal hemorrhoids  04/11/2010   By colonoscopy  . Iron deficiency anemia   . Myocardial infarction ([place]) 2009  . Peripheral arterial disease ([place])     a. s/p PTA and stenting of L CIA stenosis 05/2013.  Marland Kitchen Renal insufficiency   . Stroke [facility])    mini stroke  . Tobacco abuse    Quit in 05/2013    Past Surgical History:  Procedure Laterality Date  . CAPSULE ENDOSCOPY  10/27/2019   [facility]) evidence of "pinhole leaking" in both stomach and proximal small bowel; no overt bleeding noted  . CHOLECYSTECTOMY  1975  . COLONOSCOPY  04/11/2010  . COLONOSCOPY  11/30/2015  . CORONARY ANGIOPLASTY WITH STENT PLACEMENT  08/05/2007   Promus DES 2.5 mm x 23 mm (3 mm)  to RCA; EF 60-70%  . ENDARTERECTOMY Left 07/28/2019   Procedure: ENDARTERECTOMY CAROTID;  Surgeon: Waynetta Sandy, MD;  Location: [place];  Service: Vascular;  Laterality: Left;  . ENDARTERECTOMY Right 08/27/2019   Procedure: ENDARTERECTOMY CAROTID RIGHT WITH PATCH ANGIOPLASTY;  Surgeon: Waynetta Sandy, MD;  Location: [place];  Service: Vascular;  Laterality: Right;  . ENTEROSCOPY N/A 12/27/2015   Procedure: ENTEROSCOPY;  Surgeon: Clarene Essex, MD;  Location: [facility] ENDOSCOPY;  Service: Endoscopy;  Laterality: N/A;  also needs slim egd scope  . ENTEROSCOPY N/A 05/15/2019   Procedure: ENTEROSCOPY;  Surgeon: Ronald Lobo, MD;  Location: [place];  Service: Endoscopy;  Laterality: N/A;  Push enteroscopy using either ultraslim colonoscope or push enteroscope  . ENTEROSCOPY N/A 08/08/2019   Procedure: ENTEROSCOPY;  Surgeon: Ronnette Juniper,  MD;  Location: [place];  Service: Gastroenterology;  Laterality: N/A;  . ESOPHAGOGASTRODUODENOSCOPY N/A 09/13/2013   Procedure: ESOPHAGOGASTRODUODENOSCOPY (EGD);  Surgeon: Lear Ng, MD;  Location: [facility] ENDOSCOPY;  Service: Endoscopy;  Laterality: N/A;  . ESOPHAGOGASTRODUODENOSCOPY N/A 07/14/2015   Procedure: ESOPHAGOGASTRODUODENOSCOPY (EGD);  Surgeon: Clarene Essex, MD;  Location: [facility] ENDOSCOPY;  Service: Endoscopy;  Laterality: N/A;  . gi bleed  07/2015   blood given  . HOT HEMOSTASIS N/A 12/27/2015   Procedure: HOT HEMOSTASIS (ARGON PLASMA  COAGULATION/BICAP);  Surgeon: Clarene Essex, MD;  Location: [facility] END[facility];  Service: Endoscopy;  Laterality: N/A;  . HOT HEMOSTASIS N/A 08/08/2019   Procedure: HOT HEMOSTASIS (ARGON PLASMA COAGULATION/BICAP);  Surgeon: Ronnette Juniper, MD;  Location: [place];  Service: Gastroenterology;  Laterality: N/A;  . ILIAC ARTERY STENT Left 05/04/2013   L CIA-EIA -- Dr. Gwenlyn Found  . LEFT HEART CATH AND CORONARY ANGIOGRAPHY N/A 02/11/2019   Procedure: LEFT HEART CATH AND CORONARY ANGIOGRAPHY;  Surgeon: Leonie Man, MD;  Location: [place] CV LAB;;-  Patent RCA stent with minimal ISR after pRCA 50%).  Small caliber RPAV with ~75%.  pLAD 30%, m-dLAD 40%, D2 40%.  Normal LVEDP and LVEF.  . LOWER EXTREMITY ANGIOGRAM Bilateral 05/04/2013   Procedure: LOWER EXTREMITY ANGIOGRAM;  Surgeon: Lorretta Harp, MD;  Location: [facility] CATH LAB;  Service: Cardiovascular;  Laterality: Bilateral;  . NM MYOVIEW LTD  09/07/2015   EF > 65%. Nonspecific ST changes but no ischemic changes. No ischemia or infarction. LOW RISK  . PATCH ANGIOPLASTY Left 07/28/2019   Procedure: PATCH ANGIOPLASTY USING Rueben Bash BIOLOGIC PATCH;  Surgeon: Waynetta Sandy, MD;  Location: [place];  Service: Vascular;  Laterality: Left;  . TONSILLECTOMY  1960's  . TRANSTHORACIC ECHOCARDIOGRAM  05/14/2019   Normal EF 60 to 65%.  No R WMA.   Normal RV but mildly elevated pressures.  Relatively normal aortic and mitral valves.  Marland Kitchen UPPER GI ENDOSCOPY  12/14/2015    Social History   Socioeconomic History  . Marital status: Married    Spouse name: Not on file  . Number of children: 1  . Years of education: 13  . Highest education level: High school graduate  Occupational History    Employer: RETIRED  Tobacco Use  . Smoking status: Light Tobacco Smoker    Types: Cigarettes    Last attempt to quit: 05/04/2013    Years since quitting: 7.1  . Smokeless tobacco: Never Used  . Tobacco comment: She initially quit, but has gone back to smoking a couple  cigarettes every now and then.  Vaping Use  . Vaping Use: Never used  Substance and Sexual Activity  . Alcohol use: Yes    Comment: occasionally 3 times a year  . Drug use: No  . Sexual activity: Not Currently    Partners: Male    Birth control/protection: None  Other Topics Concern  . Not on file  Social History Narrative   Married. Has one child. Grandmother for a great-grandmother and 1.   No real exercise.   Occasional alcohol consumption.   Quit smoking inf Feb 2015 -- after L Iliac Stent   Right-handed   Caffeine: 1 cup every morning   Right-handed.   Lives at home with husband.   Social Determinants of Health   Financial Resource Strain: Not on file  Food Insecurity: Not on file  Transportation Needs: Not on file  Physical Activity: Not on file  Stress: Not on file  Social Connections: Not on  file  Intimate Partner Violence: Not on file   Family History  Problem Relation Age of Onset  . Diabetes Mother   . Heart disease Mother   . Heart attack Mother 67  . Breast cancer Mother 48  . Colon cancer Mother 45  . Heart attack Father 27  . Pneumonia Father   . Diabetes Sister   . Breast cancer Sister   . Breast cancer Sister 34    Current Outpatient Medications  Medication Sig Dispense Refill  . albuterol (VENTOLIN HFA) 108 (90 Base) MCG/ACT inhaler Inhale 2 puffs into the lungs every 6 (six) hours as needed for wheezing or shortness of breath.    Marland Kitchen aspirin EC 81 MG tablet Take 81 mg by mouth daily.    Marland Kitchen atorvastatin (LIPITOR) 80 MG tablet Take 1 tablet (80 mg total) by mouth daily. 90 tablet 3  . ezetimibe (ZETIA) 10 MG tablet Take 1 tablet (10 mg total) by mouth daily. 90 tablet 3  . ferrous gluconate (FERGON) 324 MG tablet Take 1 tablet (324 mg total) by mouth 2 (two) times daily with a meal. 60 tablet 3  . furosemide (LASIX) 20 MG tablet Take 20 mg  One tablet on Monday , Wednesday , Fridays , may take an additional  20 mg ff needed 90 tablet 0  . isosorbide  mononitrate (IMDUR) 60 MG 24 hr tablet Take 1 tablet (60 mg total) by mouth daily. 60 tablet 11  . metoprolol succinate (TOPROL-XL) 100 MG 24 hr tablet Take 2 tablets (200 mg total) by mouth at bedtime. 180 tablet 3  . nitroGLYCERIN (NITROSTAT) 0.4 MG SL tablet Place 1 tablet (0.4 mg total) under the tongue every 5 (five) minutes as needed for chest pain. 25 tablet 6  . ondansetron (ZOFRAN) 4 MG tablet Take 1 tablet (4 mg total) by mouth every 6 (six) hours as needed for nausea. 20 tablet 0  . pantoprazole (PROTONIX) 40 MG tablet Take 1 tablet (40 mg total) by mouth daily. 30 tablet 0  . valsartan (DIOVAN) 40 MG tablet Take 1 tablet by mouth once daily 30 tablet 3  . amLODipine (NORVASC) 5 MG tablet Take 1 tablet (5 mg total) by mouth daily. 90 tablet 3   No current facility-administered medications for this visit.    No Known Allergies   REVIEW OF SYSTEMS:   [X]  denotes positive finding, [ ]  denotes negative finding Cardiac  Comments:  Chest pain or chest pressure:    Shortness of breath upon exertion:    Short of breath when lying flat:    Irregular heart rhythm:        Vascular    Pain in calf, thigh, or hip brought on by ambulation:    Pain in feet at night that wakes you up from your sleep:     Blood clot in your veins:    Leg swelling:         Pulmonary    Oxygen at home:    Productive cough:     Wheezing:         Neurologic    Sudden weakness in arms or legs:     Sudden numbness in arms or legs:     Sudden onset of difficulty speaking or slurred speech: x   Temporary loss of vision in one eye:     Problems with dizziness:         Gastrointestinal    Blood in stool:     Vomited blood:  Genitourinary    Burning when urinating:     Blood in urine:        Psychiatric    Major depression:         Hematologic    Bleeding problems:    Problems with blood clotting too easily:        Skin    Rashes or ulcers:        Constitutional    Fever or chills:       PHYSICAL EXAMINATION:  Vitals:   06/20/20 1044 06/20/20 1048  BP: 129/63 131/69  Pulse: 61 61  Resp: 14   Temp: (!) 97.5 F (36.4 C)   TempSrc: Temporal   SpO2: 95%   Weight: 130 lb (59 kg)   Height: 4\' 11"  (1.499 m)     General:  WDWN in NAD; vital signs documented above Gait: Unaided, no ataxia HENT: WNL, normocephalic Pulmonary: normal non-labored breathing , without Rales, rhonchi,  wheezing Cardiac: regular HR, without  Murmurs without carotid bruit Abdomen: soft, NT, no masses Skin: without rashes Vascular Exam/Pulses: Bilateral 2+ radial and dorsalis pedis pulses Extremities: without ischemic changes, without Gangrene , without cellulitis; without open wounds;  Musculoskeletal: no muscle wasting or atrophy  Neurologic: A&O X 3;  No focal weakness or paresthesias are detected Psychiatric:  The pt has Normal affect.   Non-Invasive Vascular Imaging:   06/20/2020 Right Carotid: There is no evidence of stenosis in the right ICA. The ECA appears >50% stenosed.   Left Carotid: There is no evidence of stenosis in the left ICA.   Bilateral subclavian arteries showed multiphasic waveforms.  Bilateral vertebral arteries have antegrade flow.  ASSESSMENT/PLAN:: 75 y.o. female here for follow up for carotid artery disease.  She is now 1 year postop bilateral interim we will carotid endarterectomies.  She states she has occasional, persistent slurred speech since her TIA 1 year ago.  Her duplex today shows no restenosis.  Denies new or worsening symptoms.  She is compliant with her medication.  We reviewed these symptoms of acute stroke or TIA and advised her to call EMS should these occur.  Encouraged smoking cessation.  Follow-up in 1 year with carotid duplex.  Barbie Banner, PA-C [facility] [phone]  Clinic MD:   Trula Slade

## 2020-07-18 ENCOUNTER — Other Ambulatory Visit: Payer: Self-pay | Admitting: Adult Health

## 2020-07-18 NOTE — Telephone Encounter (Signed)
Rx has been sent to the pharmacy electronically. ° °

## 2020-07-26 DIAGNOSIS — I5032 Chronic diastolic (congestive) heart failure: Secondary | ICD-10-CM | POA: Diagnosis not present

## 2020-07-26 DIAGNOSIS — I25119 Atherosclerotic heart disease of native coronary artery with unspecified angina pectoris: Secondary | ICD-10-CM | POA: Diagnosis not present

## 2020-07-26 DIAGNOSIS — E785 Hyperlipidemia, unspecified: Secondary | ICD-10-CM | POA: Diagnosis not present

## 2020-07-26 DIAGNOSIS — K219 Gastro-esophageal reflux disease without esophagitis: Secondary | ICD-10-CM | POA: Diagnosis not present

## 2020-07-26 DIAGNOSIS — D509 Iron deficiency anemia, unspecified: Secondary | ICD-10-CM | POA: Diagnosis not present

## 2020-07-26 DIAGNOSIS — E559 Vitamin D deficiency, unspecified: Secondary | ICD-10-CM | POA: Diagnosis not present

## 2020-07-26 DIAGNOSIS — N2581 Secondary hyperparathyroidism of renal origin: Secondary | ICD-10-CM | POA: Diagnosis not present

## 2020-07-26 DIAGNOSIS — I779 Disorder of arteries and arterioles, unspecified: Secondary | ICD-10-CM | POA: Diagnosis not present

## 2020-07-26 DIAGNOSIS — J449 Chronic obstructive pulmonary disease, unspecified: Secondary | ICD-10-CM | POA: Diagnosis not present

## 2020-07-26 DIAGNOSIS — I10 Essential (primary) hypertension: Secondary | ICD-10-CM | POA: Diagnosis not present

## 2020-07-26 DIAGNOSIS — M810 Age-related osteoporosis without current pathological fracture: Secondary | ICD-10-CM | POA: Diagnosis not present

## 2020-08-02 DIAGNOSIS — I739 Peripheral vascular disease, unspecified: Secondary | ICD-10-CM | POA: Diagnosis not present

## 2020-08-02 DIAGNOSIS — K922 Gastrointestinal hemorrhage, unspecified: Secondary | ICD-10-CM | POA: Diagnosis not present

## 2020-08-02 DIAGNOSIS — R7303 Prediabetes: Secondary | ICD-10-CM | POA: Diagnosis not present

## 2020-08-02 DIAGNOSIS — N2581 Secondary hyperparathyroidism of renal origin: Secondary | ICD-10-CM | POA: Diagnosis not present

## 2020-08-02 DIAGNOSIS — Z720 Tobacco use: Secondary | ICD-10-CM | POA: Diagnosis not present

## 2020-08-02 DIAGNOSIS — E785 Hyperlipidemia, unspecified: Secondary | ICD-10-CM | POA: Diagnosis not present

## 2020-08-02 DIAGNOSIS — I129 Hypertensive chronic kidney disease with stage 1 through stage 4 chronic kidney disease, or unspecified chronic kidney disease: Secondary | ICD-10-CM | POA: Diagnosis not present

## 2020-08-02 DIAGNOSIS — I2510 Atherosclerotic heart disease of native coronary artery without angina pectoris: Secondary | ICD-10-CM | POA: Diagnosis not present

## 2020-08-02 DIAGNOSIS — N189 Chronic kidney disease, unspecified: Secondary | ICD-10-CM | POA: Diagnosis not present

## 2020-08-02 DIAGNOSIS — J449 Chronic obstructive pulmonary disease, unspecified: Secondary | ICD-10-CM | POA: Diagnosis not present

## 2020-08-02 DIAGNOSIS — N183 Chronic kidney disease, stage 3 unspecified: Secondary | ICD-10-CM | POA: Diagnosis not present

## 2020-08-30 ENCOUNTER — Other Ambulatory Visit: Payer: Self-pay

## 2020-08-30 ENCOUNTER — Ambulatory Visit (HOSPITAL_COMMUNITY)
Admission: EM | Admit: 2020-08-30 | Discharge: 2020-08-30 | Disposition: A | Discharge status: Home / Self Care | Payer: Medicare Other | Attending: Medical Oncology | Admitting: Medical Oncology

## 2020-08-30 DIAGNOSIS — R0602 Shortness of breath: Secondary | ICD-10-CM | POA: Diagnosis not present

## 2020-08-30 DIAGNOSIS — R059 Cough, unspecified: Secondary | ICD-10-CM | POA: Diagnosis not present

## 2020-08-30 DIAGNOSIS — Z8719 Personal history of other diseases of the digestive system: Secondary | ICD-10-CM | POA: Diagnosis not present

## 2020-08-30 DIAGNOSIS — J45909 Unspecified asthma, uncomplicated: Secondary | ICD-10-CM | POA: Diagnosis not present

## 2020-08-30 NOTE — ED Notes (Signed)
Attempted to call for patient x 2 in lobby with no answer.

## 2020-08-30 NOTE — ED Notes (Addendum)
Attempted to call patient on phone to identify location after seeing order for EKG, no answer, VM is not setup

## 2020-09-07 ENCOUNTER — Encounter (HOSPITAL_COMMUNITY): Payer: Self-pay

## 2020-09-07 ENCOUNTER — Ambulatory Visit (HOSPITAL_COMMUNITY)
Admission: EM | Admit: 2020-09-07 | Discharge: 2020-09-07 | Payer: Medicare Other | Attending: Medical Oncology | Admitting: Medical Oncology

## 2020-09-07 ENCOUNTER — Emergency Department (HOSPITAL_COMMUNITY): Payer: Medicare Other

## 2020-09-07 ENCOUNTER — Other Ambulatory Visit: Payer: Self-pay

## 2020-09-07 ENCOUNTER — Inpatient Hospital Stay (HOSPITAL_COMMUNITY)
Admission: EM | Admit: 2020-09-07 | Discharge: 2020-09-09 | DRG: 377 | Disposition: A | Discharge status: Home / Self Care | Payer: Medicare Other | Attending: Family Medicine | Admitting: Family Medicine

## 2020-09-07 DIAGNOSIS — K921 Melena: Secondary | ICD-10-CM | POA: Diagnosis not present

## 2020-09-07 DIAGNOSIS — Z8249 Family history of ischemic heart disease and other diseases of the circulatory system: Secondary | ICD-10-CM

## 2020-09-07 DIAGNOSIS — I739 Peripheral vascular disease, unspecified: Secondary | ICD-10-CM | POA: Diagnosis present

## 2020-09-07 DIAGNOSIS — D500 Iron deficiency anemia secondary to blood loss (chronic): Secondary | ICD-10-CM | POA: Diagnosis present

## 2020-09-07 DIAGNOSIS — R5381 Other malaise: Secondary | ICD-10-CM | POA: Diagnosis not present

## 2020-09-07 DIAGNOSIS — Z7982 Long term (current) use of aspirin: Secondary | ICD-10-CM | POA: Diagnosis not present

## 2020-09-07 DIAGNOSIS — I2721 Secondary pulmonary arterial hypertension: Secondary | ICD-10-CM | POA: Diagnosis present

## 2020-09-07 DIAGNOSIS — Z79899 Other long term (current) drug therapy: Secondary | ICD-10-CM

## 2020-09-07 DIAGNOSIS — I10 Essential (primary) hypertension: Secondary | ICD-10-CM | POA: Diagnosis not present

## 2020-09-07 DIAGNOSIS — K2940 Chronic atrophic gastritis without bleeding: Secondary | ICD-10-CM | POA: Diagnosis not present

## 2020-09-07 DIAGNOSIS — R195 Other fecal abnormalities: Secondary | ICD-10-CM

## 2020-09-07 DIAGNOSIS — N183 Chronic kidney disease, stage 3 unspecified: Secondary | ICD-10-CM | POA: Diagnosis present

## 2020-09-07 DIAGNOSIS — R55 Syncope and collapse: Secondary | ICD-10-CM

## 2020-09-07 DIAGNOSIS — Z833 Family history of diabetes mellitus: Secondary | ICD-10-CM | POA: Diagnosis not present

## 2020-09-07 DIAGNOSIS — R0902 Hypoxemia: Secondary | ICD-10-CM | POA: Diagnosis not present

## 2020-09-07 DIAGNOSIS — Z8673 Personal history of transient ischemic attack (TIA), and cerebral infarction without residual deficits: Secondary | ICD-10-CM | POA: Diagnosis not present

## 2020-09-07 DIAGNOSIS — I252 Old myocardial infarction: Secondary | ICD-10-CM | POA: Diagnosis not present

## 2020-09-07 DIAGNOSIS — K922 Gastrointestinal hemorrhage, unspecified: Secondary | ICD-10-CM | POA: Diagnosis not present

## 2020-09-07 DIAGNOSIS — R42 Dizziness and giddiness: Secondary | ICD-10-CM

## 2020-09-07 DIAGNOSIS — K2941 Chronic atrophic gastritis with bleeding: Secondary | ICD-10-CM | POA: Diagnosis present

## 2020-09-07 DIAGNOSIS — D696 Thrombocytopenia, unspecified: Secondary | ICD-10-CM | POA: Diagnosis not present

## 2020-09-07 DIAGNOSIS — K5521 Angiodysplasia of colon with hemorrhage: Principal | ICD-10-CM | POA: Diagnosis present

## 2020-09-07 DIAGNOSIS — E785 Hyperlipidemia, unspecified: Secondary | ICD-10-CM | POA: Diagnosis not present

## 2020-09-07 DIAGNOSIS — Z9861 Coronary angioplasty status: Secondary | ICD-10-CM | POA: Diagnosis not present

## 2020-09-07 DIAGNOSIS — R0602 Shortness of breath: Secondary | ICD-10-CM

## 2020-09-07 DIAGNOSIS — Z20822 Contact with and (suspected) exposure to covid-19: Secondary | ICD-10-CM | POA: Diagnosis present

## 2020-09-07 DIAGNOSIS — Z87891 Personal history of nicotine dependence: Secondary | ICD-10-CM | POA: Diagnosis not present

## 2020-09-07 DIAGNOSIS — D509 Iron deficiency anemia, unspecified: Secondary | ICD-10-CM | POA: Diagnosis present

## 2020-09-07 DIAGNOSIS — I2510 Atherosclerotic heart disease of native coronary artery without angina pectoris: Secondary | ICD-10-CM

## 2020-09-07 DIAGNOSIS — Z955 Presence of coronary angioplasty implant and graft: Secondary | ICD-10-CM

## 2020-09-07 DIAGNOSIS — N179 Acute kidney failure, unspecified: Secondary | ICD-10-CM | POA: Diagnosis not present

## 2020-09-07 DIAGNOSIS — K219 Gastro-esophageal reflux disease without esophagitis: Secondary | ICD-10-CM | POA: Diagnosis present

## 2020-09-07 DIAGNOSIS — N1831 Chronic kidney disease, stage 3a: Secondary | ICD-10-CM | POA: Diagnosis present

## 2020-09-07 DIAGNOSIS — M549 Dorsalgia, unspecified: Secondary | ICD-10-CM | POA: Diagnosis not present

## 2020-09-07 DIAGNOSIS — K31819 Angiodysplasia of stomach and duodenum without bleeding: Secondary | ICD-10-CM | POA: Diagnosis not present

## 2020-09-07 DIAGNOSIS — J9601 Acute respiratory failure with hypoxia: Secondary | ICD-10-CM | POA: Diagnosis present

## 2020-09-07 DIAGNOSIS — J45909 Unspecified asthma, uncomplicated: Secondary | ICD-10-CM | POA: Diagnosis present

## 2020-09-07 DIAGNOSIS — I130 Hypertensive heart and chronic kidney disease with heart failure and stage 1 through stage 4 chronic kidney disease, or unspecified chronic kidney disease: Secondary | ICD-10-CM | POA: Diagnosis present

## 2020-09-07 DIAGNOSIS — I5032 Chronic diastolic (congestive) heart failure: Secondary | ICD-10-CM | POA: Diagnosis present

## 2020-09-07 DIAGNOSIS — K31811 Angiodysplasia of stomach and duodenum with bleeding: Secondary | ICD-10-CM | POA: Diagnosis not present

## 2020-09-07 LAB — COMPREHENSIVE METABOLIC PANEL
ALT: 14 U/L (ref 0–44)
AST: 20 U/L (ref 15–41)
Albumin: 3.6 g/dL (ref 3.5–5.0)
Alkaline Phosphatase: 47 U/L (ref 38–126)
Anion gap: 9 (ref 5–15)
BUN: 42 mg/dL — ABNORMAL HIGH (ref 8–23)
CO2: 27 mmol/L (ref 22–32)
Calcium: 9.3 mg/dL (ref 8.9–10.3)
Chloride: 103 mmol/L (ref 98–111)
Creatinine, Ser: 1.98 mg/dL — ABNORMAL HIGH (ref 0.44–1.00)
GFR, Estimated: 26 mL/min — ABNORMAL LOW (ref >60)
Glucose, Bld: 93 mg/dL (ref 70–99)
Potassium: 4.1 mmol/L (ref 3.5–5.1)
Sodium: 139 mmol/L (ref 135–145)
Total Bilirubin: 0.9 mg/dL (ref 0.3–1.2)
Total Protein: 7.7 g/dL (ref 6.5–8.1)

## 2020-09-07 LAB — RESP PANEL BY RT-PCR (FLU A&B, COVID) ARPGX2
Influenza A by PCR: NEGATIVE
Influenza B by PCR: NEGATIVE
SARS Coronavirus 2 by RT PCR: NEGATIVE

## 2020-09-07 LAB — POC OCCULT BLOOD, ED: Fecal Occult Bld: POSITIVE — AB

## 2020-09-07 LAB — CBC
HCT: 45.1 % (ref 36.0–46.0)
Hemoglobin: 14.5 g/dL (ref 12.0–15.0)
MCH: 29.2 pg (ref 26.0–34.0)
MCHC: 32.2 g/dL (ref 30.0–36.0)
MCV: 90.7 fL (ref 80.0–100.0)
Platelets: 173 10*3/uL (ref 150–400)
RBC: 4.97 MIL/uL (ref 3.87–5.11)
RDW: 18.4 % — ABNORMAL HIGH (ref 11.5–15.5)
WBC: 6.4 10*3/uL (ref 4.0–10.5)
nRBC: 0 % (ref 0.0–0.2)

## 2020-09-07 LAB — HEMOGLOBIN AND HEMATOCRIT, BLOOD
HCT: 42.3 % (ref 36.0–46.0)
Hemoglobin: 13.8 g/dL (ref 12.0–15.0)

## 2020-09-07 LAB — TROPONIN I (HIGH SENSITIVITY)
Troponin I (High Sensitivity): 7 ng/L (ref <18)
Troponin I (High Sensitivity): 6 ng/L (ref <18)

## 2020-09-07 LAB — BRAIN NATRIURETIC PEPTIDE: B Natriuretic Peptide: 136.4 pg/mL — ABNORMAL HIGH (ref 0.0–100.0)

## 2020-09-07 MED ORDER — SODIUM CHLORIDE 0.9 % IV BOLUS
1000.0000 mL | Freq: Once | INTRAVENOUS | Status: AC
  Administered 2020-09-07: 1000 mL via INTRAVENOUS

## 2020-09-07 MED ORDER — FERROUS GLUCONATE 324 (38 FE) MG PO TABS
324.0000 mg | ORAL_TABLET | Freq: Two times a day (BID) | ORAL | Status: DC
  Administered 2020-09-08 – 2020-09-09 (×3): 324 mg via ORAL
  Filled 2020-09-07 (×4): qty 1

## 2020-09-07 MED ORDER — PANTOPRAZOLE SODIUM 40 MG IV SOLR
40.0000 mg | Freq: Once | INTRAVENOUS | Status: AC
  Administered 2020-09-07: 40 mg via INTRAVENOUS
  Filled 2020-09-07: qty 40

## 2020-09-07 MED ORDER — ALBUTEROL SULFATE HFA 108 (90 BASE) MCG/ACT IN AERS
4.0000 | INHALATION_SPRAY | Freq: Once | RESPIRATORY_TRACT | Status: AC
  Administered 2020-09-07: 4 via RESPIRATORY_TRACT
  Filled 2020-09-07: qty 6.7

## 2020-09-07 MED ORDER — ACETAMINOPHEN 325 MG PO TABS
650.0000 mg | ORAL_TABLET | Freq: Four times a day (QID) | ORAL | Status: DC | PRN
  Administered 2020-09-09: 650 mg via ORAL
  Filled 2020-09-07: qty 2

## 2020-09-07 MED ORDER — HYDROCODONE-ACETAMINOPHEN 5-325 MG PO TABS
1.0000 | ORAL_TABLET | ORAL | Status: DC | PRN
Start: 2020-09-07 — End: 2020-09-09

## 2020-09-07 MED ORDER — SODIUM CHLORIDE 0.9 % IV SOLN: INTRAVENOUS | Status: DC

## 2020-09-07 MED ORDER — ISOSORBIDE MONONITRATE ER 60 MG PO TB24
60.0000 mg | ORAL_TABLET | Freq: Two times a day (BID) | ORAL | Status: DC
  Administered 2020-09-07 – 2020-09-09 (×4): 60 mg via ORAL
  Filled 2020-09-07 (×4): qty 1

## 2020-09-07 MED ORDER — ACETAMINOPHEN 650 MG RE SUPP: 650.0000 mg | Freq: Four times a day (QID) | RECTAL | Status: DC | PRN

## 2020-09-07 MED ORDER — PANTOPRAZOLE SODIUM 40 MG PO TBEC
40.0000 mg | DELAYED_RELEASE_TABLET | Freq: Every day | ORAL | Status: DC
  Administered 2020-09-08 – 2020-09-09 (×2): 40 mg via ORAL
  Filled 2020-09-07 (×2): qty 1

## 2020-09-07 MED ORDER — ATORVASTATIN CALCIUM 80 MG PO TABS
80.0000 mg | ORAL_TABLET | Freq: Every day | ORAL | Status: DC
  Administered 2020-09-08 – 2020-09-09 (×2): 80 mg via ORAL
  Filled 2020-09-07 (×2): qty 1

## 2020-09-07 MED ORDER — ONDANSETRON HCL 4 MG PO TABS: 4.0000 mg | ORAL_TABLET | Freq: Four times a day (QID) | ORAL | Status: DC | PRN

## 2020-09-07 MED ORDER — METOPROLOL SUCCINATE ER 100 MG PO TB24
200.0000 mg | ORAL_TABLET | Freq: Every day | ORAL | Status: DC
  Administered 2020-09-07 – 2020-09-08 (×2): 200 mg via ORAL
  Filled 2020-09-07 (×2): qty 2

## 2020-09-07 MED ORDER — EZETIMIBE 10 MG PO TABS
10.0000 mg | ORAL_TABLET | Freq: Every day | ORAL | Status: DC
  Administered 2020-09-08 – 2020-09-09 (×2): 10 mg via ORAL
  Filled 2020-09-07 (×2): qty 1

## 2020-09-07 MED ORDER — NITROGLYCERIN 0.4 MG SL SUBL: 0.4000 mg | SUBLINGUAL_TABLET | SUBLINGUAL | Status: DC | PRN

## 2020-09-07 MED ORDER — ALBUTEROL SULFATE HFA 108 (90 BASE) MCG/ACT IN AERS: 2.0000 | INHALATION_SPRAY | Freq: Four times a day (QID) | RESPIRATORY_TRACT | Status: DC | PRN

## 2020-09-07 MED ORDER — AMLODIPINE BESYLATE 5 MG PO TABS
5.0000 mg | ORAL_TABLET | Freq: Every day | ORAL | Status: DC
  Administered 2020-09-07 – 2020-09-08 (×2): 5 mg via ORAL
  Filled 2020-09-07 (×3): qty 1

## 2020-09-07 NOTE — ED Notes (Signed)
Pt ambulated well to the bathroom  

## 2020-09-07 NOTE — ED Notes (Signed)
RN attempted report x1.  

## 2020-09-07 NOTE — ED Triage Notes (Signed)
Pt presents with dizziness and shortness of breath X 1 week.

## 2020-09-07 NOTE — ED Notes (Signed)
Pt placed on 3 L oxygen; O2 increased to 96%

## 2020-09-07 NOTE — ED Triage Notes (Signed)
Patient arrived by EMS from [facility] with complaint of weakness and SOB x 1 week. Has hx of anemia that requires blood transfusion. Denies pain. Alert and oriented. Oxygen sat was 88% on RA a=t UC, placed on 2l on arrival. NAD

## 2020-09-07 NOTE — Plan of Care (Signed)

## 2020-09-07 NOTE — ED Notes (Signed)
RN placed pt on 2L of O 2 ?

## 2020-09-07 NOTE — ED Provider Notes (Addendum)
[facility] EMERGENCY DEPARTMENT Provider Note   CSN: [id number] Arrival date & time: 09/07/20  6599     History Chief Complaint  Patient presents with  . Dizziness  . Shortness of Breath    Kayla Mcclure is a 75 y.o. female.  Patient c/o sob and lightheadedness in past two weeks. Symptoms gradual onset, moderate, constant, persistent, worse w standing. Episodes of near syncope but no syncope. Denies chest pain or discomfort. Occasional non prod cough. No sore throat or other uri symptoms. No fever or chills. No leg pain or swelling. States compliant w home meds. Uses albuterol tx regularly. +smoker. States stools have been dark brown, and at  times black - hx same, also takes iron - but states recent appearance of stools more c/w blood in stool.  Denies abd pain or nv.   The history is provided by the patient and a relative.       Past Medical History:  Diagnosis Date  . Acute GI bleeding 08/07/2019  . Asthma  10/31/2007  . CAD (coronary artery disease) 08/05/2007   a) NSTEMI 2009: PCI to the RCA Promus DES 2.5 mm x 23 mm (3.0 mm). b) Myoview June 2017: Nonspecific ST changes. EF greater than 65%. LOW RISK, normal study. No ischemia or infarction.  . Carotid artery disease [facility])    s/p Bilateral CEA March & May 2020.   Marland Kitchen Chronic back pain   . Chronic diastolic heart failure ([place])   . CKD (chronic kidney disease), stage II   . Diverticulosis   04/11/2010    Colonoscopy  . Dyslipidemia, goal LDL below 70   . Essential hypertension    a. Normal renal arteries by PV angio 05/2013.  Marland Kitchen GERD (gastroesophageal reflux disease)   . GI bleed 12/2015  . History of kidney stones 1975  . History of recent blood transfusion 12/2015   12-19-15, 12-20-15 and 12-21-15  . Insomnia   . Internal hemorrhoids  04/11/2010   By colonoscopy  . Iron deficiency anemia   . Myocardial infarction ([place]) 2009  . Peripheral arterial disease ([place])    a. s/p PTA and stenting of L  CIA stenosis 05/2013.  Marland Kitchen Renal insufficiency   . Stroke [facility])    mini stroke  . Tobacco abuse    Quit in 05/2013    Patient Active Problem List   Diagnosis Date Noted  . Asymptomatic carotid artery stenosis without infarction, right 08/27/2019  . Carotid artery stenosis 07/28/2019  . Carotid stenosis 07/28/2019  . Language difficulty 05/19/2019  . Paresthesia 05/19/2019  . Syncope 05/12/2019  . Chronic diastolic CHF (congestive heart failure) ([place]) 05/12/2019  . Progressive angina ([place]) 02/04/2019  . Coronary artery disease involving native coronary artery of native heart with angina pectoris ([place]) 02/04/2019  . Symptomatic anemia 12/19/2015  . DOE (dyspnea on exertion) 09/03/2015  . Bilateral carotid artery disease ([place]) 07/08/2015  . Former heavy cigarette smoker (20-39 per day) 02/04/2014  . GI bleed 09/27/2013  . Claudication of left lower extremity > right 03/06/2013  . Dyslipidemia, goal LDL below 70 12/23/2012  . PAD (peripheral artery disease) - bilateral CIA & CFA 50-69%; Claudication 12/22/2012  . Obesity (BMI 30-39.9) 12/22/2012  . Internal hemorrhoids 04/11/2010  . Diverticulosis 04/11/2010  . Chronic insomnia 10/07/2009  . Iron deficiency anemia 09/23/2008  . Chronic back pain 09/23/2008  . CKD, stage 2 08/15/2008  . Essential hypertension, benign 10/31/2007  . Asthma 10/31/2007  . CAD S/P percutaneous coronary  angioplasty -- Promus DES 2.5 mm x 20 mm postdilated to 3 mm 08/05/2007  . History of MI (myocardial infarction)  - NSTEMI with PCI of RCA. 08/05/2007    Past Surgical History:  Procedure Laterality Date  . CAPSULE ENDOSCOPY  10/27/2019   [facility]) evidence of "pinhole leaking" in both stomach and proximal small bowel; no overt bleeding noted  . CHOLECYSTECTOMY  1975  . COLONOSCOPY  04/11/2010  . COLONOSCOPY  11/30/2015  . CORONARY ANGIOPLASTY WITH STENT PLACEMENT  08/05/2007   Promus DES 2.5 mm x 23 mm (3 mm)  to RCA; EF 60-70%  . ENDARTERECTOMY Left  07/28/2019   Procedure: ENDARTERECTOMY CAROTID;  Surgeon: Waynetta Sandy, MD;  Location: [place];  Service: Vascular;  Laterality: Left;  . ENDARTERECTOMY Right 08/27/2019   Procedure: ENDARTERECTOMY CAROTID RIGHT WITH PATCH ANGIOPLASTY;  Surgeon: Waynetta Sandy, MD;  Location: [place];  Service: Vascular;  Laterality: Right;  . ENTEROSCOPY N/A 12/27/2015   Procedure: ENTEROSCOPY;  Surgeon: Clarene Essex, MD;  Location: [facility] ENDOSCOPY;  Service: Endoscopy;  Laterality: N/A;  also needs slim egd scope  . ENTEROSCOPY N/A 05/15/2019   Procedure: ENTEROSCOPY;  Surgeon: Ronald Lobo, MD;  Location: [place];  Service: Endoscopy;  Laterality: N/A;  Push enteroscopy using either ultraslim colonoscope or push enteroscope  . ENTEROSCOPY N/A 08/08/2019   Procedure: ENTEROSCOPY;  Surgeon: Ronnette Juniper, MD;  Location: [place];  Service: Gastroenterology;  Laterality: N/A;  . ESOPHAGOGASTRODUODENOSCOPY N/A 09/13/2013   Procedure: ESOPHAGOGASTRODUODENOSCOPY (EGD);  Surgeon: Lear Ng, MD;  Location: [facility] ENDOSCOPY;  Service: Endoscopy;  Laterality: N/A;  . ESOPHAGOGASTRODUODENOSCOPY N/A 07/14/2015   Procedure: ESOPHAGOGASTRODUODENOSCOPY (EGD);  Surgeon: Clarene Essex, MD;  Location: [facility] ENDOSCOPY;  Service: Endoscopy;  Laterality: N/A;  . gi bleed  07/2015   blood given  . HOT HEMOSTASIS N/A 12/27/2015   Procedure: HOT HEMOSTASIS (ARGON PLASMA COAGULATION/BICAP);  Surgeon: Clarene Essex, MD;  Location: [facility];  Service: Endoscopy;  Laterality: N/A;  . HOT HEMOSTASIS N/A 08/08/2019   Procedure: HOT HEMOSTASIS (ARGON PLASMA COAGULATION/BICAP);  Surgeon: Ronnette Juniper, MD;  Location: [place];  Service: Gastroenterology;  Laterality: N/A;  . ILIAC ARTERY STENT Left 05/04/2013   L CIA-EIA -- Dr. Gwenlyn Found  . LEFT HEART CATH AND CORONARY ANGIOGRAPHY N/A 02/11/2019   Procedure: LEFT HEART CATH AND CORONARY ANGIOGRAPHY;  Surgeon: Leonie Man, MD;  Location: [place] CV LAB;;-  Patent RCA  stent with minimal ISR after pRCA 50%).  Small caliber RPAV with ~75%.  pLAD 30%, m-dLAD 40%, D2 40%.  Normal LVEDP and LVEF.  . LOWER EXTREMITY ANGIOGRAM Bilateral 05/04/2013   Procedure: LOWER EXTREMITY ANGIOGRAM;  Surgeon: Lorretta Harp, MD;  Location: [facility] CATH LAB;  Service: Cardiovascular;  Laterality: Bilateral;  . NM MYOVIEW LTD  09/07/2015   EF > 65%. Nonspecific ST changes but no ischemic changes. No ischemia or infarction. LOW RISK  . PATCH ANGIOPLASTY Left 07/28/2019   Procedure: PATCH ANGIOPLASTY USING Rueben Bash BIOLOGIC PATCH;  Surgeon: Waynetta Sandy, MD;  Location: [place];  Service: Vascular;  Laterality: Left;  . TONSILLECTOMY  1960's  . TRANSTHORACIC ECHOCARDIOGRAM  05/14/2019   Normal EF 60 to 65%.  No R WMA.   Normal RV but mildly elevated pressures.  Relatively normal aortic and mitral valves.  Marland Kitchen UPPER GI ENDOSCOPY  12/14/2015     OB History    Gravida  1   Para  1   Term      Preterm  AB      Living        SAB      IAB      Ectopic      Multiple      Live Births              Family History  Problem Relation Age of Onset  . Diabetes Mother   . Heart disease Mother   . Heart attack Mother 24  . Breast cancer Mother 26  . Colon cancer Mother 21  . Heart attack Father 66  . Pneumonia Father   . Diabetes Sister   . Breast cancer Sister   . Breast cancer Sister 72    Social History   Tobacco Use  . Smoking status: Light Tobacco Smoker    Types: Cigarettes    Last attempt to quit: 05/04/2013    Years since quitting: 7.3  . Smokeless tobacco: Never Used  . Tobacco comment: She initially quit, but has gone back to smoking a couple cigarettes every now and then.  Vaping Use  . Vaping Use: Never used  Substance Use Topics  . Alcohol use: Yes    Comment: occasionally 3 times a year  . Drug use: No    Home Medications Prior to Admission medications   Medication Sig Start Date End Date Taking? Authorizing Provider  albuterol  (VENTOLIN HFA) 108 (90 Base) MCG/ACT inhaler Inhale 2 puffs into the lungs every 6 (six) hours as needed for wheezing or shortness of breath.    [provider]  amLODipine (NORVASC) 5 MG tablet Take 1 tablet (5 mg total) by mouth daily. 01/11/20 04/10/20  Leonie Man, MD  aspirin EC 81 MG tablet Take 81 mg by mouth daily.    [provider]  atorvastatin (LIPITOR) 80 MG tablet Take 1 tablet (80 mg total) by mouth daily. 05/27/19   Lendon Colonel, NP  ezetimibe (ZETIA) 10 MG tablet Take 1 tablet (10 mg total) by mouth daily. 05/27/19   Lendon Colonel, NP  ferrous gluconate (FERGON) 324 MG tablet Take 1 tablet (324 mg total) by mouth 2 (two) times daily with a meal. 07/15/15   Rai, Vernelle Emerald, MD  furosemide (LASIX) 20 MG tablet Take 20 mg  One tablet on Monday , Wednesday , Fridays , may take an additional  20 mg ff needed 04/15/20   Leonie Man, MD  isosorbide mononitrate (IMDUR) 60 MG 24 hr tablet TAKE 1 AND 1/2 TABLETS BY  MOUTH DAILY 07/18/20   Leonie Man, MD  metoprolol succinate (TOPROL-XL) 100 MG 24 hr tablet Take 2 tablets (200 mg total) by mouth at bedtime. 06/01/19   Lendon Colonel, NP  nitroGLYCERIN (NITROSTAT) 0.4 MG SL tablet Place 1 tablet (0.4 mg total) under the tongue every 5 (five) minutes as needed for chest pain. 02/04/19   Leonie Man, MD  ondansetron (ZOFRAN) 4 MG tablet Take 1 tablet (4 mg total) by mouth every 6 (six) hours as needed for nausea. 08/09/19   Georgette Shell, MD  pantoprazole (PROTONIX) 40 MG tablet Take 1 tablet (40 mg total) by mouth daily. 09/14/13   Ghimire, Henreitta Leber, MD  valsartan (DIOVAN) 40 MG tablet Take 1 tablet by mouth once daily 05/23/20   Rodriguez-Guzman, Raquel, RPH-CPP    Allergies    Patient has no known allergies.  Review of Systems   Review of Systems  Constitutional: Negative for chills and fever.  HENT: Negative for sore  throat.   Eyes: Negative for redness.  Respiratory: Positive for cough  and shortness of breath.   Cardiovascular: Negative for chest pain, palpitations and leg swelling.  Gastrointestinal: Negative for abdominal pain, nausea and vomiting.  Genitourinary: Negative for dysuria and flank pain.  Musculoskeletal: Negative for back pain and neck pain.  Skin: Negative for rash.  Neurological: Positive for dizziness and light-headedness. Negative for headaches.  Hematological: Does not bruise/bleed easily.  Psychiatric/Behavioral: Negative for confusion.    Physical Exam Updated Vital Signs BP 134/63   Pulse 73   Temp 98.4 F (36.9 C) (Oral)   Resp 16   SpO2 94%   Physical Exam Vitals and nursing note reviewed.  Constitutional:      Appearance: Normal appearance. She is well-developed.  HENT:     Head: Atraumatic.     Nose: Nose normal.     Mouth/Throat:     Mouth: Mucous membranes are moist.  Eyes:     General: No scleral icterus.    Conjunctiva/sclera: Conjunctivae normal.     Pupils: Pupils are equal, round, and reactive to light.  Neck:     Trachea: No tracheal deviation.  Cardiovascular:     Rate and Rhythm: Normal rate and regular rhythm.     Pulses: Normal pulses.     Heart sounds: Normal heart sounds. No murmur heard. No friction rub. No gallop.   Pulmonary:     Effort: Pulmonary effort is normal. No respiratory distress.     Breath sounds: Wheezing present.     Comments: Mild exp wheeze. Abdominal:     General: Bowel sounds are normal. There is no distension.     Palpations: Abdomen is soft.     Tenderness: There is no abdominal tenderness. There is no guarding.  Genitourinary:    Comments: No cva tenderness. Very dark stool, almost black, heme pos.  Musculoskeletal:        General: No swelling or tenderness.     Cervical back: Normal range of motion and neck supple. No rigidity. No muscular tenderness.     Right lower leg: No edema.     Left lower leg: No edema.  Skin:    General: Skin is warm and dry.     Findings: No rash.   Neurological:     Mental Status: She is alert.     Comments: Alert, speech normal. Motor/sens grossly intact bil. Steady gait.   Psychiatric:        Mood and Affect: Mood normal.     ED Results / Procedures / Treatments   Labs (all labs ordered are listed, but only abnormal results are displayed) Results for orders placed or performed during the hospital encounter of 09/07/20  Resp Panel by RT-PCR (Flu A&B, Covid) Nasopharyngeal Swab   Specimen: Nasopharyngeal Swab; Nasopharyngeal(NP) swabs in vial transport medium  Result Value Ref Range   SARS Coronavirus 2 by RT PCR NEGATIVE NEGATIVE   Influenza A by PCR NEGATIVE NEGATIVE   Influenza B by PCR NEGATIVE NEGATIVE  Comprehensive metabolic panel  Result Value Ref Range   Sodium 139 135 - 145 mmol/L   Potassium 4.1 3.5 - 5.1 mmol/L   Chloride 103 98 - 111 mmol/L   CO2 27 22 - 32 mmol/L   Glucose, Bld 93 70 - 99 mg/dL   BUN 42 (H) 8 - 23 mg/dL   Creatinine, Ser 1.98 (H) 0.44 - 1.00 mg/dL   Calcium 9.3 8.9 - 10.3 mg/dL   Total Protein 7.7  6.5 - 8.1 g/dL   Albumin 3.6 3.5 - 5.0 g/dL   AST 20 15 - 41 U/L   ALT 14 0 - 44 U/L   Alkaline Phosphatase 47 38 - 126 U/L   Total Bilirubin 0.9 0.3 - 1.2 mg/dL   GFR, Estimated 26 (L) >60 mL/min   Anion gap 9 5 - 15  CBC  Result Value Ref Range   WBC 6.4 4.0 - 10.5 K/uL   RBC 4.97 3.87 - 5.11 MIL/uL   Hemoglobin 14.5 12.0 - 15.0 g/dL   HCT 45.1 36.0 - 46.0 %   MCV 90.7 80.0 - 100.0 fL   MCH 29.2 26.0 - 34.0 pg   MCHC 32.2 30.0 - 36.0 g/dL   RDW 18.4 (H) 11.5 - 15.5 %   Platelets 173 150 - 400 K/uL   nRBC 0.0 0.0 - 0.2 %  Brain natriuretic peptide  Result Value Ref Range   B Natriuretic Peptide 136.4 (H) 0.0 - 100.0 pg/mL  POC occult blood, ED  Result Value Ref Range   Fecal Occult Bld POSITIVE (A) NEGATIVE  Type and screen [place]  Result Value Ref Range   ABO/RH(D) A POS    Antibody Screen POS    Sample Expiration 09/10/2020,2359    Antibody  Identification      ANTI c Performed at [facility], [address] [place], [place] [address]   Troponin I (High Sensitivity)  Result Value Ref Range   Troponin I (High Sensitivity) 7 <18 ng/L  Troponin I (High Sensitivity)  Result Value Ref Range   Troponin I (High Sensitivity) 6 <18 ng/L   DG Chest 2 View  Result Date: 09/07/2020 CLINICAL DATA:  Shortness of breath. EXAM: CHEST - 2 VIEW COMPARISON:  July 14, 2015 FINDINGS: Cardiomediastinal silhouette is normal. Mediastinal contours appear intact. Calcific atherosclerotic disease and tortuosity of the aorta. There is no evidence of focal airspace consolidation, pleural effusion or pneumothorax. Osseous structures are without acute abnormality. Soft tissues are grossly normal. IMPRESSION: No active cardiopulmonary disease. Electronically Signed   By: Fidela Salisbury M.D.   On: 09/07/2020 10:58    EKG EKG Interpretation  Date/Time:  Wednesday September 07 2020 10:05:47 EDT Ventricular Rate:  60 PR Interval:  146 QRS Duration: 70 QT Interval:  410 QTC Calculation: 410 R Axis:   80 Text Interpretation: Normal sinus rhythm No significant change since last tracing Confirmed by Lajean Saver [id number]) on 09/07/2020 11:52:59 AM   Radiology DG Chest 2 View  Result Date: 09/07/2020 CLINICAL DATA:  Shortness of breath. EXAM: CHEST - 2 VIEW COMPARISON:  July 14, 2015 FINDINGS: Cardiomediastinal silhouette is normal. Mediastinal contours appear intact. Calcific atherosclerotic disease and tortuosity of the aorta. There is no evidence of focal airspace consolidation, pleural effusion or pneumothorax. Osseous structures are without acute abnormality. Soft tissues are grossly normal. IMPRESSION: No active cardiopulmonary disease. Electronically Signed   By: Fidela Salisbury M.D.   On: 09/07/2020 10:58    Procedures Procedures   Medications Ordered in ED Medications  albuterol (VENTOLIN HFA) 108 (90 Base) MCG/ACT inhaler 4 puff (4  puffs Inhalation Given 09/07/20 1208)  sodium chloride 0.9 % bolus 1,000 mL (1,000 mLs Intravenous New Bag/Given 09/07/20 1234)    ED Course  I have reviewed the triage vital signs and the nursing notes.  Pertinent labs & imaging results that were available during my care of the patient were reviewed by me and considered in my medical decision making (  see chart for details).    MDM Rules/Calculators/A&P                         Labs and imaging ordered. Ecg. o2 [place].   Reviewed nursing notes and prior charts for additional history.   Albuterol tx.   Labs reviewed/interpreted by me - hgb and wbc normal. Cr increased from prior - ns bolus.   CXR reviewed/interpreted by me - no pna.   Given recurrent near syncope/dizziness, sob, black/heme pos stools and AKI - will admit for obs, serial hgb.  Pt indicates her pcp is with [facility] service consulted for admission.   Protonix iv.       Final Clinical Impression(s) / ED Diagnoses Final diagnoses:  None    Rx / DC Orders ED Discharge Orders    None         Lajean Saver, MD 09/07/20 1432

## 2020-09-07 NOTE — ED Provider Notes (Signed)
[place]    CSN: [id number] Arrival date & time: 09/07/20  0813      History   Chief Complaint Chief Complaint  Patient presents with  . Dizziness  . Shortness of Breath    HPI Kayla Mcclure is a 75 y.o. female.   HPI   Dizziness: Patient reports that for the past week she has had dizziness and shortness of breath. No known head injury mentioned.  She is concerned that this could be from a potential GI bleed which is happened in the past versus other.  She has not noticed any significant bleeding episodes but has been advised by her PCP to come to urgent care for further work-up.  She denies any significant chest pain or cough.  No known fever.  According to patient she has no history of COPD and to her knowledge has never had any trouble with her O2 saturation.   Past Medical History:  Diagnosis Date  . Acute GI bleeding 08/07/2019  . Asthma  10/31/2007  . CAD (coronary artery disease) 08/05/2007   a) NSTEMI 2009: PCI to the RCA Promus DES 2.5 mm x 23 mm (3.0 mm). b) Myoview June 2017: Nonspecific ST changes. EF greater than 65%. LOW RISK, normal study. No ischemia or infarction.  . Carotid artery disease [facility])    s/p Bilateral CEA March & May 2020.   Marland Kitchen Chronic back pain   . Chronic diastolic heart failure ([place])   . CKD (chronic kidney disease), stage II   . Diverticulosis   04/11/2010    Colonoscopy  . Dyslipidemia, goal LDL below 70   . Essential hypertension    a. Normal renal arteries by PV angio 05/2013.  Marland Kitchen GERD (gastroesophageal reflux disease)   . GI bleed 12/2015  . History of kidney stones 1975  . History of recent blood transfusion 12/2015   12-19-15, 12-20-15 and 12-21-15  . Insomnia   . Internal hemorrhoids  04/11/2010   By colonoscopy  . Iron deficiency anemia   . Myocardial infarction ([place]) 2009  . Peripheral arterial disease ([place])    a. s/p PTA and stenting of L CIA stenosis 05/2013.  Marland Kitchen Renal insufficiency   . Stroke [facility])    mini  stroke  . Tobacco abuse    Quit in 05/2013    Patient Active Problem List   Diagnosis Date Noted  . Asymptomatic carotid artery stenosis without infarction, right 08/27/2019  . Carotid artery stenosis 07/28/2019  . Carotid stenosis 07/28/2019  . Language difficulty 05/19/2019  . Paresthesia 05/19/2019  . Syncope 05/12/2019  . Chronic diastolic CHF (congestive heart failure) ([place]) 05/12/2019  . Progressive angina ([place]) 02/04/2019  . Coronary artery disease involving native coronary artery of native heart with angina pectoris ([place]) 02/04/2019  . Symptomatic anemia 12/19/2015  . DOE (dyspnea on exertion) 09/03/2015  . Bilateral carotid artery disease ([place]) 07/08/2015  . Former heavy cigarette smoker (20-39 per day) 02/04/2014  . GI bleed 09/27/2013  . Claudication of left lower extremity > right 03/06/2013  . Dyslipidemia, goal LDL below 70 12/23/2012  . PAD (peripheral artery disease) - bilateral CIA & CFA 50-69%; Claudication 12/22/2012  . Obesity (BMI 30-39.9) 12/22/2012  . Internal hemorrhoids 04/11/2010  . Diverticulosis 04/11/2010  . Chronic insomnia 10/07/2009  . Iron deficiency anemia 09/23/2008  . Chronic back pain 09/23/2008  . CKD, stage 2 08/15/2008  . Essential hypertension, benign 10/31/2007  . Asthma 10/31/2007  . CAD S/P percutaneous coronary angioplasty -- Promus  DES 2.5 mm x 20 mm postdilated to 3 mm 08/05/2007  . History of MI (myocardial infarction)  - NSTEMI with PCI of RCA. 08/05/2007    Past Surgical History:  Procedure Laterality Date  . CAPSULE ENDOSCOPY  10/27/2019   [facility]) evidence of "pinhole leaking" in both stomach and proximal small bowel; no overt bleeding noted  . CHOLECYSTECTOMY  1975  . COLONOSCOPY  04/11/2010  . COLONOSCOPY  11/30/2015  . CORONARY ANGIOPLASTY WITH STENT PLACEMENT  08/05/2007   Promus DES 2.5 mm x 23 mm (3 mm)  to RCA; EF 60-70%  . ENDARTERECTOMY Left 07/28/2019   Procedure: ENDARTERECTOMY CAROTID;  Surgeon: Waynetta Sandy, MD;  Location: [place];  Service: Vascular;  Laterality: Left;  . ENDARTERECTOMY Right 08/27/2019   Procedure: ENDARTERECTOMY CAROTID RIGHT WITH PATCH ANGIOPLASTY;  Surgeon: Waynetta Sandy, MD;  Location: [place];  Service: Vascular;  Laterality: Right;  . ENTEROSCOPY N/A 12/27/2015   Procedure: ENTEROSCOPY;  Surgeon: Clarene Essex, MD;  Location: [facility] ENDOSCOPY;  Service: Endoscopy;  Laterality: N/A;  also needs slim egd scope  . ENTEROSCOPY N/A 05/15/2019   Procedure: ENTEROSCOPY;  Surgeon: Ronald Lobo, MD;  Location: [place];  Service: Endoscopy;  Laterality: N/A;  Push enteroscopy using either ultraslim colonoscope or push enteroscope  . ENTEROSCOPY N/A 08/08/2019   Procedure: ENTEROSCOPY;  Surgeon: Ronnette Juniper, MD;  Location: [place];  Service: Gastroenterology;  Laterality: N/A;  . ESOPHAGOGASTRODUODENOSCOPY N/A 09/13/2013   Procedure: ESOPHAGOGASTRODUODENOSCOPY (EGD);  Surgeon: Lear Ng, MD;  Location: [facility] ENDOSCOPY;  Service: Endoscopy;  Laterality: N/A;  . ESOPHAGOGASTRODUODENOSCOPY N/A 07/14/2015   Procedure: ESOPHAGOGASTRODUODENOSCOPY (EGD);  Surgeon: Clarene Essex, MD;  Location: [facility] ENDOSCOPY;  Service: Endoscopy;  Laterality: N/A;  . gi bleed  07/2015   blood given  . HOT HEMOSTASIS N/A 12/27/2015   Procedure: HOT HEMOSTASIS (ARGON PLASMA COAGULATION/BICAP);  Surgeon: Clarene Essex, MD;  Location: [facility];  Service: Endoscopy;  Laterality: N/A;  . HOT HEMOSTASIS N/A 08/08/2019   Procedure: HOT HEMOSTASIS (ARGON PLASMA COAGULATION/BICAP);  Surgeon: Ronnette Juniper, MD;  Location: [place];  Service: Gastroenterology;  Laterality: N/A;  . ILIAC ARTERY STENT Left 05/04/2013   L CIA-EIA -- Dr. Gwenlyn Found  . LEFT HEART CATH AND CORONARY ANGIOGRAPHY N/A 02/11/2019   Procedure: LEFT HEART CATH AND CORONARY ANGIOGRAPHY;  Surgeon: Leonie Man, MD;  Location: [place] CV LAB;;-  Patent RCA stent with minimal ISR after pRCA 50%).  Small caliber RPAV with ~75%.   pLAD 30%, m-dLAD 40%, D2 40%.  Normal LVEDP and LVEF.  . LOWER EXTREMITY ANGIOGRAM Bilateral 05/04/2013   Procedure: LOWER EXTREMITY ANGIOGRAM;  Surgeon: Lorretta Harp, MD;  Location[facility] CATH LAB;  Service: Cardiovascular;  Laterality: Bilateral;  . NM MYOVIEW LTD  09/07/2015   EF > 65%. Nonspecific ST changes but no ischemic changes. No ischemia or infarction. LOW RISK  . PATCH ANGIOPLASTY Left 07/28/2019   Procedure: PATCH ANGIOPLASTY USING Rueben Bash BIOLOGIC PATCH;  Surgeon: Waynetta Sandy, MD;  Location: [place];  Service: Vascular;  Laterality: Left;  . TONSILLECTOMY  1960's  . TRANSTHORACIC ECHOCARDIOGRAM  05/14/2019   Normal EF 60 to 65%.  No R WMA.   Normal RV but mildly elevated pressures.  Relatively normal aortic and mitral valves.  Marland Kitchen UPPER GI ENDOSCOPY  12/14/2015    OB History    Gravida  1   Para  1   Term      Preterm      AB  Living        SAB      IAB      Ectopic      Multiple      Live Births               Home Medications    Prior to Admission medications   Medication Sig Start Date End Date Taking? Authorizing Provider  albuterol (VENTOLIN HFA) 108 (90 Base) MCG/ACT inhaler Inhale 2 puffs into the lungs every 6 (six) hours as needed for wheezing or shortness of breath.    [provider]  amLODipine (NORVASC) 5 MG tablet Take 1 tablet (5 mg total) by mouth daily. 01/11/20 04/10/20  Leonie Man, MD  aspirin EC 81 MG tablet Take 81 mg by mouth daily.    [provider]  atorvastatin (LIPITOR) 80 MG tablet Take 1 tablet (80 mg total) by mouth daily. 05/27/19   Lendon Colonel, NP  ezetimibe (ZETIA) 10 MG tablet Take 1 tablet (10 mg total) by mouth daily. 05/27/19   Lendon Colonel, NP  ferrous gluconate (FERGON) 324 MG tablet Take 1 tablet (324 mg total) by mouth 2 (two) times daily with a meal. 07/15/15   Rai, Vernelle Emerald, MD  furosemide (LASIX) 20 MG tablet Take 20 mg  One tablet on Monday , Wednesday ,  Fridays , may take an additional  20 mg ff needed 04/15/20   Leonie Man, MD  isosorbide mononitrate (IMDUR) 60 MG 24 hr tablet TAKE 1 AND 1/2 TABLETS BY  MOUTH DAILY 07/18/20   Leonie Man, MD  metoprolol succinate (TOPROL-XL) 100 MG 24 hr tablet Take 2 tablets (200 mg total) by mouth at bedtime. 06/01/19   Lendon Colonel, NP  nitroGLYCERIN (NITROSTAT) 0.4 MG SL tablet Place 1 tablet (0.4 mg total) under the tongue every 5 (five) minutes as needed for chest pain. 02/04/19   Leonie Man, MD  ondansetron (ZOFRAN) 4 MG tablet Take 1 tablet (4 mg total) by mouth every 6 (six) hours as needed for nausea. 08/09/19   Georgette Shell, MD  pantoprazole (PROTONIX) 40 MG tablet Take 1 tablet (40 mg total) by mouth daily. 09/14/13   Ghimire, Henreitta Leber, MD  valsartan (DIOVAN) 40 MG tablet Take 1 tablet by mouth once daily 05/23/20   Rodriguez-Guzman, Raquel, RPH-CPP    Family History Family History  Problem Relation Age of Onset  . Diabetes Mother   . Heart disease Mother   . Heart attack Mother 85  . Breast cancer Mother 32  . Colon cancer Mother 60  . Heart attack Father 37  . Pneumonia Father   . Diabetes Sister   . Breast cancer Sister   . Breast cancer Sister 72    Social History Social History   Tobacco Use  . Smoking status: Light Tobacco Smoker    Types: Cigarettes    Last attempt to quit: 05/04/2013    Years since quitting: 7.3  . Smokeless tobacco: Never Used  . Tobacco comment: She initially quit, but has gone back to smoking a couple cigarettes every now and then.  Vaping Use  . Vaping Use: Never used  Substance Use Topics  . Alcohol use: Yes    Comment: occasionally 3 times a year  . Drug use: No     Allergies   Patient has no known allergies.   Review of Systems Review of Systems  As stated above in HPI Physical Exam Triage Vital Signs  ED Triage Vitals  Enc Vitals Group     BP 09/07/20 0853 (!) 145/71     Pulse Rate 09/07/20 0853 62     Resp  09/07/20 0853 18     Temp 09/07/20 0853 98.8 F (37.1 C)     Temp Source 09/07/20 0853 Oral     SpO2 09/07/20 0853 (!) 88 %     Weight --      Height --      Head Circumference --      Peak Flow --      Pain Score 09/07/20 0856 0     Pain Loc --      Pain Edu? --      Excl. in [place]? --    No data found.  Updated Vital Signs BP (!) 145/71 (BP Location: Left Arm)   Pulse 62   Temp 98.8 F (37.1 C) (Oral)   Resp 18   SpO2 (!) 88%   Physical Exam Vitals and nursing note reviewed.  Constitutional:      General: She is not in acute distress.    Appearance: She is well-developed. She is not ill-appearing, toxic-appearing or diaphoretic.  HENT:     Head: Normocephalic and atraumatic.     Mouth/Throat:     Mouth: Mucous membranes are moist.  Eyes:     Extraocular Movements: Extraocular movements intact.     Pupils: Pupils are equal, round, and reactive to light.     Comments: Mild pallor  Cardiovascular:     Rate and Rhythm: Normal rate and regular rhythm.     Comments: No carotid bruits auscultated bilaterally  Pulmonary:     Effort: Pulmonary effort is normal.     Breath sounds: Normal breath sounds.  Chest:     Chest wall: No deformity.  Musculoskeletal:     Cervical back: Normal range of motion and neck supple.  Skin:    General: Skin is warm.     Capillary Refill: Capillary refill takes more than 3 seconds.     Coloration: Skin is not cyanotic.     Nails: There is no clubbing.  Neurological:     General: No focal deficit present.     Mental Status: She is alert and oriented to person, place, and time.     Motor: No weakness.  Psychiatric:        Mood and Affect: Mood normal.        Behavior: Behavior normal.      UC Treatments / Results  Labs (all labs ordered are listed, but only abnormal results are displayed) Labs Reviewed - No data to display  EKG   Radiology No results found.  Procedures Procedures (including critical care time)  Medications  Ordered in UC Medications - No data to display  Initial Impression / Assessment and Plan / UC Course  I have reviewed the triage vital signs and the nursing notes.  Pertinent labs & imaging results that were available during my care of the patient were reviewed by me and considered in my medical decision making (see chart for details).     New.  I am concerned that her O2 saturation is 88% which is new for patient along with her shortness of breath and dizziness.  She will need further work-up in the emergency room which I discussed with patient to rule out pulmonary embolism, severe anemia, CAP, etc.  She drove herself and given her symptoms and O2 saturation she is not clinically safe  to drive herself so we are going to call EMS for her.  EKG pending while we await EMS.  She has been started on 2 L of oxygen.  Final Clinical Impressions(s) / UC Diagnoses   Final diagnoses:  None   Discharge Instructions   None    ED Prescriptions    None     PDMP not reviewed this encounter.   Hughie Closs, [place] 09/07/20 [phone]

## 2020-09-07 NOTE — H&P (Signed)
History and Physical    Kayla Mcclure MVH:[id number] DOB: 1946/01/01 DOA: 09/07/2020  PCP: [name], MD Consultants:  Cardiology: Dr. Marquis Lunch, vascular surgery: Dr. Donzetta Matters   Hematology: Dr. Sherral Hammers, GI: Dr. Jerene Pitch, nephrology: Dr. Justin Mend Patient coming from:  [facility] - lives with her husband   Chief Complaint: light headed, shortness of breath and back hurting.   HPI: Kayla Mcclure is a 75 y.o. female with medical history significant of recurrent GI bleeds thought to be due to known AVMs thought to be secondary to chronic aspirin use and anticoagulation, CAD with stent placement in 2009, HTN, HLD, iron deficiency anemia, chronic diastolic CHF, mild asthma, CKD stage 3, former smoker, but smokes every now and then, PAD, coronary artery stenosis s/p left CEA in 07/2019.  She states about 2 weeks ago she started to have light headedness, shortness of breath and her back hurt. She states these symptoms are typical when she has a GI bleed. Her stool also looks black, but no visible red blood. She states symptoms are worse with standing and walking, but if she lays down and is still symptoms go away. Also the dizziness is worse with head movement. She has hx of 3 GI bleeds this year, but has been going on x 2 years. Last gastric bleed was in 4/22 (these are recurrent) last EGD was at this time (she thinks it was normal) and her last colonoscopy was in 03/2020. She states her colonoscopy was normal.  She also did a capsule study x 2 in 5/21 and 2/21. She states they can't seem to find why she continues to have bleeding. She kept putting off getting help and went to an urgent care on 08/30/20, but left before she was seen.  She then went to urgent care today and was sent to ER for further work up due to low oxygen on room air. She denies any increase sputum production, cough, fever. Denies any increased wheezing or tightness. Denies any sick contacts. She denies any nausea, vomiting. She has been  eating and drinking, but doesn't eat much at baseline.  Her back is sore around mid back and hurts lying down or getting up to walk. Denies any trauma or fall.   No family hx of colon cancer. Hx of jejunal AVMs noted in 2017 s/p treatment with APC.  She has had covid vaccines x 4.    ED Course: noted to have low oxygen on room air while at urgent care and placed on [place] 2L. +hemoccult stool card.   Review of Systems: As per HPI; otherwise review of systems reviewed and negative.   Ambulatory Status:  Ambulates without assistance   Past Medical History:  Diagnosis Date  . Acute GI bleeding 08/07/2019  . Asthma  10/31/2007  . CAD (coronary artery disease) 08/05/2007   a) NSTEMI 2009: PCI to the RCA Promus DES 2.5 mm x 23 mm (3.0 mm). b) Myoview June 2017: Nonspecific ST changes. EF greater than 65%. LOW RISK, normal study. No ischemia or infarction.  . Carotid artery disease [facility]    s/p Bilateral CEA March & May 2020.   Marland Kitchen Chronic back pain   . Chronic diastolic heart failure ([place])   . CKD (chronic kidney disease), stage II   . Diverticulosis   04/11/2010    Colonoscopy  . Dyslipidemia, goal LDL below 70   . Essential hypertension    a. Normal renal arteries by PV angio 05/2013.  Marland Kitchen GERD (gastroesophageal reflux disease)   .  GI bleed 12/2015  . History of kidney stones 1975  . History of recent blood transfusion 12/2015   12-19-15, 12-20-15 and 12-21-15  . Insomnia   . Internal hemorrhoids  04/11/2010   By colonoscopy  . Iron deficiency anemia   . Myocardial infarction ([place]) 2009  . Peripheral arterial disease ([place])    a. s/p PTA and stenting of L CIA stenosis 05/2013.  Marland Kitchen Renal insufficiency   . Stroke [facility])    mini stroke  . Tobacco abuse    Quit in 05/2013    Past Surgical History:  Procedure Laterality Date  . CAPSULE ENDOSCOPY  10/27/2019   [facility]) evidence of "pinhole leaking" in both stomach and proximal small bowel; no overt bleeding noted  . CHOLECYSTECTOMY  1975  .  COLONOSCOPY  04/11/2010  . COLONOSCOPY  11/30/2015  . CORONARY ANGIOPLASTY WITH STENT PLACEMENT  08/05/2007   Promus DES 2.5 mm x 23 mm (3 mm)  to RCA; EF 60-70%  . ENDARTERECTOMY Left 07/28/2019   Procedure: ENDARTERECTOMY CAROTID;  Surgeon: Waynetta Sandy, MD;  Location: [place];  Service: Vascular;  Laterality: Left;  . ENDARTERECTOMY Right 08/27/2019   Procedure: ENDARTERECTOMY CAROTID RIGHT WITH PATCH ANGIOPLASTY;  Surgeon: Waynetta Sandy, MD;  Location: [place];  Service: Vascular;  Laterality: Right;  . ENTEROSCOPY N/A 12/27/2015   Procedure: ENTEROSCOPY;  Surgeon: Clarene Essex, MD;  Location: [facility] ENDOSCOPY;  Service: Endoscopy;  Laterality: N/A;  also needs slim egd scope  . ENTEROSCOPY N/A 05/15/2019   Procedure: ENTEROSCOPY;  Surgeon: Ronald Lobo, MD;  Location: [place];  Service: Endoscopy;  Laterality: N/A;  Push enteroscopy using either ultraslim colonoscope or push enteroscope  . ENTEROSCOPY N/A 08/08/2019   Procedure: ENTEROSCOPY;  Surgeon: Ronnette Juniper, MD;  Location: [place];  Service: Gastroenterology;  Laterality: N/A;  . ESOPHAGOGASTRODUODENOSCOPY N/A 09/13/2013   Procedure: ESOPHAGOGASTRODUODENOSCOPY (EGD);  Surgeon: Lear Ng, MD;  Location: [facility] ENDOSCOPY;  Service: Endoscopy;  Laterality: N/A;  . ESOPHAGOGASTRODUODENOSCOPY N/A 07/14/2015   Procedure: ESOPHAGOGASTRODUODENOSCOPY (EGD);  Surgeon: Clarene Essex, MD;  Location: [facility] ENDOSCOPY;  Service: Endoscopy;  Laterality: N/A;  . gi bleed  07/2015   blood given  . HOT HEMOSTASIS N/A 12/27/2015   Procedure: HOT HEMOSTASIS (ARGON PLASMA COAGULATION/BICAP);  Surgeon: Clarene Essex, MD;  Location: [facility];  Service: Endoscopy;  Laterality: N/A;  . HOT HEMOSTASIS N/A 08/08/2019   Procedure: HOT HEMOSTASIS (ARGON PLASMA COAGULATION/BICAP);  Surgeon: Ronnette Juniper, MD;  Location: [place];  Service: Gastroenterology;  Laterality: N/A;  . ILIAC ARTERY STENT Left 05/04/2013   L CIA-EIA -- Dr. Gwenlyn Found  .  LEFT HEART CATH AND CORONARY ANGIOGRAPHY N/A 02/11/2019   Procedure: LEFT HEART CATH AND CORONARY ANGIOGRAPHY;  Surgeon: Leonie Man, MD;  Location: [place] CV LAB;;-  Patent RCA stent with minimal ISR after pRCA 50%).  Small caliber RPAV with ~75%.  pLAD 30%, m-dLAD 40%, D2 40%.  Normal LVEDP and LVEF.  . LOWER EXTREMITY ANGIOGRAM Bilateral 05/04/2013   Procedure: LOWER EXTREMITY ANGIOGRAM;  Surgeon: Lorretta Harp, MD;  Location: [facility] CATH LAB;  Service: Cardiovascular;  Laterality: Bilateral;  . NM MYOVIEW LTD  09/07/2015   EF > 65%. Nonspecific ST changes but no ischemic changes. No ischemia or infarction. LOW RISK  . PATCH ANGIOPLASTY Left 07/28/2019   Procedure: PATCH ANGIOPLASTY USING Rueben Bash BIOLOGIC PATCH;  Surgeon: Waynetta Sandy, MD;  Location: [place];  Service: Vascular;  Laterality: Left;  . TONSILLECTOMY  1960's  . TRANSTHORACIC ECHOCARDIOGRAM  05/14/2019   Normal EF 60 to 65%.  No R WMA.   Normal RV but mildly elevated pressures.  Relatively normal aortic and mitral valves.  Marland Kitchen UPPER GI ENDOSCOPY  12/14/2015    Social History   Socioeconomic History  . Marital status: Married    Spouse name: Not on file  . Number of children: 1  . Years of education: 67  . Highest education level: High school graduate  Occupational History    Employer: RETIRED  Tobacco Use  . Smoking status: Light Tobacco Smoker    Types: Cigarettes    Last attempt to quit: 05/04/2013    Years since quitting: 7.3  . Smokeless tobacco: Never Used  . Tobacco comment: She initially quit, but has gone back to smoking a couple cigarettes every now and then.  Vaping Use  . Vaping Use: Never used  Substance and Sexual Activity  . Alcohol use: Yes    Comment: occasionally 3 times a year  . Drug use: No  . Sexual activity: Not Currently    Partners: Male    Birth control/protection: None  Other Topics Concern  . Not on file  Social History Narrative   Married. Has one child. Grandmother  for a great-grandmother and 1.   No real exercise.   Occasional alcohol consumption.   Quit smoking inf Feb 2015 -- after L Iliac Stent   Right-handed   Caffeine: 1 cup every morning   Right-handed.   Lives at home with husband.   Social Determinants of Health   Financial Resource Strain: Not on file  Food Insecurity: Not on file  Transportation Needs: Not on file  Physical Activity: Not on file  Stress: Not on file  Social Connections: Not on file  Intimate Partner Violence: Not on file    No Known Allergies  Family History  Problem Relation Age of Onset  . Diabetes Mother   . Heart disease Mother   . Heart attack Mother 85  . Breast cancer Mother 18  . Colon cancer Mother 56  . Heart attack Father 52  . Pneumonia Father   . Diabetes Sister   . Breast cancer Sister   . Breast cancer Sister 94    Prior to Admission medications   Medication Sig Start Date End Date Taking? Authorizing Provider  albuterol (VENTOLIN HFA) 108 (90 Base) MCG/ACT inhaler Inhale 2 puffs into the lungs every 6 (six) hours as needed for wheezing or shortness of breath.    [provider]  amLODipine (NORVASC) 5 MG tablet Take 1 tablet (5 mg total) by mouth daily. 01/11/20 04/10/20  Leonie Man, MD  aspirin EC 81 MG tablet Take 81 mg by mouth daily.    [provider]  atorvastatin (LIPITOR) 80 MG tablet Take 1 tablet (80 mg total) by mouth daily. 05/27/19   Lendon Colonel, NP  ezetimibe (ZETIA) 10 MG tablet Take 1 tablet (10 mg total) by mouth daily. 05/27/19   Lendon Colonel, NP  ferrous gluconate (FERGON) 324 MG tablet Take 1 tablet (324 mg total) by mouth 2 (two) times daily with a meal. 07/15/15   Rai, Vernelle Emerald, MD  furosemide (LASIX) 20 MG tablet Take 20 mg  One tablet on Monday , Wednesday , Fridays , may take an additional  20 mg ff needed 04/15/20   Leonie Man, MD  isosorbide mononitrate (IMDUR) 60 MG 24 hr tablet TAKE 1 AND 1/2 TABLETS BY  MOUTH DAILY  07/18/20  Leonie Man, MD  metoprolol succinate (TOPROL-XL) 100 MG 24 hr tablet Take 2 tablets (200 mg total) by mouth at bedtime. 06/01/19   Lendon Colonel, NP  nitroGLYCERIN (NITROSTAT) 0.4 MG SL tablet Place 1 tablet (0.4 mg total) under the tongue every 5 (five) minutes as needed for chest pain. 02/04/19   Leonie Man, MD  ondansetron (ZOFRAN) 4 MG tablet Take 1 tablet (4 mg total) by mouth every 6 (six) hours as needed for nausea. 08/09/19   Georgette Shell, MD  pantoprazole (PROTONIX) 40 MG tablet Take 1 tablet (40 mg total) by mouth daily. 09/14/13   Ghimire, Henreitta Leber, MD  valsartan (DIOVAN) 40 MG tablet Take 1 tablet by mouth once daily 05/23/20   Rodriguez-Guzman, Raquel, RPH-CPP    Physical Exam: Vitals:   09/07/20 1430 09/07/20 1445 09/07/20 1530 09/07/20 1645  BP: 140/60 132/64 (!) 153/64 (!) 158/70  Pulse: 78 74 72 69  Resp: 12 17 18 15   Temp:      TempSrc:      SpO2: 98% 97% (!) 87% 91%     . General:  Appears calm and comfortable and is in NAD . Eyes:  PERRL, EOMI, normal lids, iris . ENT:  grossly normal hearing, lips & tongue, mmm; poor dentition, multiple teeth missing . Neck:  no LAD, masses or thyromegaly; no carotid bruits . Cardiovascular:  RRR, no m/r/g. No LE edema.  Marland Kitchen Respiratory:   CTA bilaterally with no wheezes/rales/rhonchi.  Normal respiratory effort. . Abdomen:  soft, NT, ND, NABS . Back:   normal alignment, no CVAT . Skin:  no rash or induration seen on limited exam . Musculoskeletal:  grossly normal tone BUE/BLE, good ROM, no bony abnormality. No TTP over any aspect of her back.  . Lower extremity:  No LE edema.  Limited foot exam with no ulcerations.  Thready distal pulses  . Psychiatric:  grossly normal mood and affect, speech fluent and appropriate, AOx3 . Neurologic:  CN 2-12 grossly intact, moves all extremities in coordinated fashion, sensation intact. dix halpike negative.     Radiological Exams on Admission: Independently  reviewed - see discussion in A/P where applicable  DG Chest 2 View  Result Date: 09/07/2020 CLINICAL DATA:  Shortness of breath. EXAM: CHEST - 2 VIEW COMPARISON:  July 14, 2015 FINDINGS: Cardiomediastinal silhouette is normal. Mediastinal contours appear intact. Calcific atherosclerotic disease and tortuosity of the aorta. There is no evidence of focal airspace consolidation, pleural effusion or pneumothorax. Osseous structures are without acute abnormality. Soft tissues are grossly normal. IMPRESSION: No active cardiopulmonary disease. Electronically Signed   By: Fidela Salisbury M.D.   On: 09/07/2020 10:58    EKG: Independently reviewed.  NSR with rate 60; nonspecific ST changes with no evidence of acute ischemia   Labs on Admission: I have personally reviewed the available labs and imaging studies at the time of the admission.  Pertinent labs: bnp: 136 Troponin negative x 2 POC occult blood: + Cmp: BUN: 42 and creatinine of 1.98    Assessment/Plan  1) GI bleed -hemoccult +, long history of recurrent GI bleeds.  -stable with stable H&H and no acute bleed, I do think she may be hemo concentrated though.  -has GI doctor in [facility] system. Will trend her H&H and watch her before we consult GI here so if stable can follow up outpatient with them. Called and discussed with GI who will see her if her H&H starts to drop, but will need to  consult them.  -continue protonix -hold ASA and anticoagulation.   2) shortness of breath with respiratory failure requiring oxygen  -has not needed oxygen prior to this, but does not check her pulse ox at home -jumping around from 88-94% on RA -placing [place] to maintain sats >92%.  -CXR with no acute findings  -? Pulmonary artery HTN on low dose CT in 12/21 -no evidence of CHF exacerbation or PE -H&H stable.   2)   Dizziness/lightheadedness  -waiting on orthostatics, she has not eaten anything all day.  -dix halpike negative  -will do very gentle IVF  hydration    3) Essential hypertension, benign -hold valsartan with AKI on CKD -continue norvasc and metoprolol   4) AKI (acute kidney injury) on CKD stage 3  -baseline creatine 1.1-1.3 -? Pre renal since hasn't eaten all day, but she has drank well.  -BUN bumped ? GI bleed as well.  -hold valsartan/lasix  -gentle IVF at 50cc/hour -monitor     5) Iron deficiency anemia -followed by hematology. On daily oral iron, will continue this and continue outpatient follow up.  -last transfusion back in April with last GI bleed.     6) CAD S/P percutaneous coronary angioplasty -- Promus DES 2.5 mm x 20 mm postdilated to 3 mm -hold ASA -continue beta blocker/statin    7) Asthma -no wheezing or tightness, will continue with albuterol PRN. Do not think it's contributing to her shortness of breath at this time.     8) Dyslipidemia, goal LDL below 70 -continue home statin/zetia. Last lipid panel to goal with LDL Of 50 in 07/2020.   9) Chronic diastolic CHF (congestive heart failure)  -last echo 05/14/2019 with EF of 60-65%. Grade 2 diastolic dysfunction. Moderately elevated pulmonary artery systolic pressure.  -appears euvolemic today -daily I/O and weights.  -very gentle ivf at 50cc/hour  -BNP at 136.  -hold lasix with AKI on CKD.    There is no height or weight on file to calculate BMI.   Level of care: Med-Surg DVT prophylaxis: SCDs Code Status:  Full - confirmed with patient/family Family Communication: None present Disposition Plan:  The patient is from: home    Anticipated d/c date will depend on clinical response to treatment and work up, but possibly as early as tomorrow if she has excellent response to treatment   Consults called: GI  Admission status:  Inpatient    Orma Flaming MD [facility]   How to contact the [facility] Attending or Consulting provider [place] or covering provider during after hours [place], for this patient?  1. Check the care team in [facility] and look  for a) attending/consulting [facility] provider listed and b) the [facility] team listed 2. Log into [email or web address] and use [place]'s universal password to access. If you do not have the password, please contact the hospital operator. 3. Locate the [facility] provider you are looking for under [facility] and page to a number that you can be directly reached. 4. If you still have difficulty reaching the provider, please page the [facility] (Director on Call) for the Hospitalists listed on amion for assistance.   09/07/2020, 5:23 PM

## 2020-09-07 NOTE — ED Notes (Addendum)
Pt SpO2 80% on RA. RN increased O2 to 3L via nasal cannula, SpO2 increased 93%. Steinl MD aware

## 2020-09-07 NOTE — ED Notes (Signed)
Patient is being discharged from the Urgent Care and sent to the Emergency Department via EMS. Per provider Nelwyn Salisbury, patient is in need of higher level of care due to hypoxia and SOB. Patient is aware and verbalizes understanding of plan of care.   Vitals:   09/07/20 0853 09/07/20 0928  BP: (!) 145/71   Pulse: 62   Resp: 18   Temp: 98.8 F (37.1 C)   SpO2: (!) 88% (!) 88%

## 2020-09-07 NOTE — ED Notes (Signed)
RN attempted report x2 

## 2020-09-07 NOTE — ED Provider Notes (Signed)
Emergency Medicine Provider Triage Evaluation Note  Kayla Mcclure , a 75 y.o. female  was evaluated in triage.  Pt complains of lightheadedness/pre syncope for the past week as well as SOB. Reports hx of anemia requiring blood transfusions with unknown source of bleeding. Has had EGD and colonoscopy without obvious findings. Is on iron supplementation. Went to UC today and was found to be hypoxic at 88 %; not typically on oxygen. Came by EMS for further eval. Pt has had a mild cough. Denies recent sick contacts. No fevers or chills. No chest pain. Did notice black stools last week; currently dark brown which she attributes to her iron.  Review of Systems  Positive: + lightheadedness, SOB, melena last week Negative: - chest pain, fevers  Physical Exam  BP (!) 159/91 (BP Location: Left Arm)   Pulse 62   Temp 98.4 F (36.9 C) (Oral)   Resp 20   SpO2 100%  Gen:   Awake, no distress   Resp:  Normal effort  MSK:   Moves extremities without difficulty  Other:    Medical Decision Making  Medically screening exam initiated at 10:14 AM.  Appropriate orders placed.  Kayla Mcclure was informed that the remainder of the evaluation will be completed by another provider, this initial triage assessment does not replace that evaluation, and the importance of remaining in the ED until their evaluation is complete.     [name], PA-C 09/07/20 1016    Lajean Saver, MD 09/09/20 [phone]

## 2020-09-08 DIAGNOSIS — I5032 Chronic diastolic (congestive) heart failure: Secondary | ICD-10-CM

## 2020-09-08 DIAGNOSIS — J45909 Unspecified asthma, uncomplicated: Secondary | ICD-10-CM

## 2020-09-08 DIAGNOSIS — I2510 Atherosclerotic heart disease of native coronary artery without angina pectoris: Secondary | ICD-10-CM

## 2020-09-08 DIAGNOSIS — N1831 Chronic kidney disease, stage 3a: Secondary | ICD-10-CM

## 2020-09-08 DIAGNOSIS — R42 Dizziness and giddiness: Secondary | ICD-10-CM

## 2020-09-08 DIAGNOSIS — I10 Essential (primary) hypertension: Secondary | ICD-10-CM

## 2020-09-08 DIAGNOSIS — Z9861 Coronary angioplasty status: Secondary | ICD-10-CM

## 2020-09-08 DIAGNOSIS — D500 Iron deficiency anemia secondary to blood loss (chronic): Secondary | ICD-10-CM

## 2020-09-08 DIAGNOSIS — N179 Acute kidney failure, unspecified: Secondary | ICD-10-CM

## 2020-09-08 DIAGNOSIS — E785 Hyperlipidemia, unspecified: Secondary | ICD-10-CM

## 2020-09-08 LAB — COMPREHENSIVE METABOLIC PANEL
ALT: 12 U/L (ref 0–44)
AST: 17 U/L (ref 15–41)
Albumin: 2.9 g/dL — ABNORMAL LOW (ref 3.5–5.0)
Alkaline Phosphatase: 36 U/L — ABNORMAL LOW (ref 38–126)
Anion gap: 7 (ref 5–15)
BUN: 31 mg/dL — ABNORMAL HIGH (ref 8–23)
CO2: 27 mmol/L (ref 22–32)
Calcium: 8.6 mg/dL — ABNORMAL LOW (ref 8.9–10.3)
Chloride: 104 mmol/L (ref 98–111)
Creatinine, Ser: 1.5 mg/dL — ABNORMAL HIGH (ref 0.44–1.00)
GFR, Estimated: 36 mL/min — ABNORMAL LOW (ref >60)
Glucose, Bld: 83 mg/dL (ref 70–99)
Potassium: 3.8 mmol/L (ref 3.5–5.1)
Sodium: 138 mmol/L (ref 135–145)
Total Bilirubin: 0.8 mg/dL (ref 0.3–1.2)
Total Protein: 6.4 g/dL — ABNORMAL LOW (ref 6.5–8.1)

## 2020-09-08 LAB — CBC
HCT: 36.8 % (ref 36.0–46.0)
HCT: 38.2 % (ref 36.0–46.0)
Hemoglobin: 11.8 g/dL — ABNORMAL LOW (ref 12.0–15.0)
Hemoglobin: 12.5 g/dL (ref 12.0–15.0)
MCH: 29 pg (ref 26.0–34.0)
MCH: 29.6 pg (ref 26.0–34.0)
MCHC: 32.1 g/dL (ref 30.0–36.0)
MCHC: 32.7 g/dL (ref 30.0–36.0)
MCV: 90.4 fL (ref 80.0–100.0)
MCV: 90.3 fL (ref 80.0–100.0)
Platelets: 144 10*3/uL — ABNORMAL LOW (ref 150–400)
Platelets: 146 10*3/uL — ABNORMAL LOW (ref 150–400)
RBC: 4.07 MIL/uL (ref 3.87–5.11)
RBC: 4.23 MIL/uL (ref 3.87–5.11)
RDW: 17.6 % — ABNORMAL HIGH (ref 11.5–15.5)
RDW: 17.3 % — ABNORMAL HIGH (ref 11.5–15.5)
WBC: 6.6 10*3/uL (ref 4.0–10.5)
WBC: 6.5 10*3/uL (ref 4.0–10.5)
nRBC: 0 % (ref 0.0–0.2)
nRBC: 0 % (ref 0.0–0.2)

## 2020-09-08 LAB — PROTIME-INR
INR: 1 (ref 0.8–1.2)
Prothrombin Time: 13 seconds (ref 11.4–15.2)

## 2020-09-08 NOTE — Progress Notes (Signed)
PROGRESS NOTE  Kayla Mcclure  WUJ:[id number] DOB: 1945-09-06 DOA: 09/07/2020 PCP: [name], MD [facility])  Outpatient Specialists: [facility] Narrative: Kayla Mcclure is a 75 y.o. 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 She developed symptoms similar to prior episodes of GI bleeding, namely dyspnea on exertion, lightheadedness, and back pain about 2 weeks ago and has continued to have dark stools attributable to iron supplement. She presented initially to urgent care, d to ED due to reported hypoxia. VSS in ED, Hgb 14.5, +FOBT, AKI on admission.  Hx of jejunal AVMs noted in 2017 s/p treatment with APC. She has hx of 3 GI bleeds this year, but has been going on x 2 years. Last gastric bleed was in 4/22 (these are recurrent) last EGD was at this time (she thinks it was normal) and her last colonoscopy was in 03/2020. She states her colonoscopy was normal.  She also did a capsule study x 2 in 5/21 and 2/21.  Assessment & Plan: Principal Problem:   GI bleed Active Problems:   Iron deficiency anemia   Essential hypertension, benign   CAD S/P percutaneous coronary angioplasty -- Promus DES 2.5 mm x 20 mm postdilated to 3 mm   Asthma   Dyslipidemia, goal LDL below 70   AKI (acute kidney injury) ([place])   Chronic diastolic CHF (congestive heart failure) (H[facility])   CKD (chronic kidney disease) stage 3, GFR 30-59 ml/min (H[facility])   Dizziness  Symptomatic acute on chronic blood loss and iron deficiency anemia due to recurrent GI bleed:  - Hgb declined 14 > 11 since admission with continue dark +FOBT stools. Recheck CBC q8h.  - D/w Dr. Watt Climes who the patient knows well. I appreciate consultation. Made NPO pending evaluation.  - Hold aspirin for now - Due for iron infusion Monday per  hematology. Will either give this here (if further work up planned) or continue with that plan.  - Add on INR  Acute hypoxic respiratory failure: Pt with longstanding history of tobacco use, likely has obstructive pulmonary disease, known asthma.  - Continue bronchodilators. No indication for steroids. No evidence of PNA or effusion or pulmonary edema on CXR. - Weaned from oxygen without direct intervention. Encourage IS.  AKI on stage IIIa CKD: Presumptive Dx based on creatinine trends.  - BUN: Cr elevated in prerenal ratio, though upper GI bleed could be to blame as well. Improved with IVF.  CAD s/p DES remotely: No chest pain. Troponin wnl x2. - Continue BB, statin, hold ASA  Dyslipidemia:  - Continue statin, zetia. LDL 50 in April 2022.   Chronic HFpEF: last echo 05/14/2019 with EF of 60-65%. Grade 2 diastolic dysfunction. Moderately elevated pulmonary artery systolic pressure. - Holding lasix. Not overloaded currently, but will monitor this closely.   Thrombocytopenia: Increases bleeding risk.  - Trend.  DVT prophylaxis: SCDs Code Status: Full Family Communication: None at bedside Disposition Plan:  Status is: Inpatient  Remains inpatient appropriate because:Ongoing diagnostic testing needed not appropriate for outpatient work up  Dispo: The patient is from: Home              Anticipated d/c is to: Home              Patient currently is not medically stable to d/c.   Difficult to  place patient No  Consultants:  [facility]  Procedures:  TBD  Antimicrobials: None   Subjective: Feels her dyspnea has improved, though still having black stools though these are more formed.   Objective: Vitals:   09/07/20 2012 09/08/20 0156 09/08/20 0525 09/08/20 0800  BP: (!) 142/68 (!) 118/58 (!) 128/59   Pulse: 72 64 64   Resp: 16 16 16    Temp: 97.9 F (36.6 C) 98.7 F (37.1 C) 98.4 F (36.9 C)   TempSrc: Oral Oral Oral   SpO2: 96% 90% (!) 89% 93%    Intake/Output Summary  (Last 24 hours) at 09/08/2020 1204 Last data filed at 09/08/2020 0845 Gross per 24 hour  Intake 2056.84 ml  Output 350 ml  Net 1706.84 ml   Gen: 75 y.o. female in no distress Pulm: Non-labored breathing room air. Clear but diminished to auscultation bilaterally.  CV: Regular rate and rhythm. No murmur, rub, or gallop. No JVD, no pedal edema. GI: Abdomen soft, non-tender, non-distended, with normoactive bowel sounds. No organomegaly or masses felt. Ext: Warm, no deformities Skin: No rashes, lesions or ulcers Neuro: Alert and oriented. No focal neurological deficits. Psych: Judgement and insight appear normal. Mood & affect appropriate.   Data Reviewed: I have personally reviewed following labs and imaging studies  CBC: Recent Labs  Lab 09/07/20 1004 09/07/20 1857 09/08/20 0021  WBC 6.4  --  6.6  HGB 14.5 13.8 11.8*  HCT 45.1 42.3 36.8  MCV 90.7  --  90.4  PLT 173  --  527*   Basic Metabolic Panel: Recent Labs  Lab 09/07/20 1004 09/08/20 0021  NA 139 138  K 4.1 3.8  CL 103 104  CO2 27 27  GLUCOSE 93 83  BUN 42* 31*  CREATININE 1.98* 1.50*  CALCIUM 9.3 8.6*   GFR: CrCl cannot be calculated (Unknown ideal weight.). Liver Function Tests: Recent Labs  Lab 09/07/20 1004 09/08/20 0021  AST 20 17  ALT 14 12  ALKPHOS 47 36*  BILITOT 0.9 0.8  PROT 7.7 6.4*  ALBUMIN 3.6 2.9*   No results for input(s): LIPASE, AMYLASE in the last 168 hours. No results for input(s): AMMONIA in the last 168 hours. Coagulation Profile: Recent Labs  Lab 09/08/20 0832  INR 1.0   Cardiac Enzymes: No results for input(s): CKTOTAL, CKMB, CKMBINDEX, TROPONINI in the last 168 hours. BNP (last 3 results) No results for input(s): PROBNP in the last 8760 hours. HbA1C: No results for input(s): HGBA1C in the last 72 hours. CBG: No results for input(s): GLUCAP in the last 168 hours. Lipid Profile: No results for input(s): CHOL, HDL, LDLCALC, TRIG, CHOLHDL, LDLDIRECT in the last 72  hours. Thyroid Function Tests: No results for input(s): TSH, T4TOTAL, FREET4, T3FREE, THYROIDAB in the last 72 hours. Anemia Panel: No results for input(s): VITAMINB12, FOLATE, FERRITIN, TIBC, IRON, RETICCTPCT in the last 72 hours. Urine analysis:    Component Value Date/Time   COLORURINE STRAW (A) 08/25/2019 0931   APPEARANCEUR CLEAR 08/25/2019 0931   LABSPEC 1.008 08/25/2019 0931   PHURINE 6.0 08/25/2019 0931   GLUCOSEU NEGATIVE 08/25/2019 0931   HGBUR NEGATIVE 08/25/2019 0931   HGBUR negative 06/07/2009 0925   BILIRUBINUR NEGATIVE 08/25/2019 0931   KETONESUR NEGATIVE 08/25/2019 0931   PROTEINUR 30 (A) 08/25/2019 0931   UROBILINOGEN 0.2 06/07/2009 0925   NITRITE NEGATIVE 08/25/2019 0931   LEUKOCYTESUR NEGATIVE 08/25/2019 0931   Recent Results (from the past 240 hour(s))  Resp Panel by RT-PCR (Flu A&B, Covid) Nasopharyngeal Swab  Status: None   Collection Time: 09/07/20 11:38 AM   Specimen: Nasopharyngeal Swab; Nasopharyngeal(NP) swabs in vial transport medium  Result Value Ref Range Status   SARS Coronavirus 2 by RT PCR NEGATIVE NEGATIVE Final    Comment: (NOTE) SARS-CoV-2 target nucleic acids are NOT DETECTED.  The SARS-CoV-2 RNA is generally detectable in upper respiratory specimens during the acute phase of infection. The lowest concentration of SARS-CoV-2 viral copies this assay can detect is 138 copies/mL. A negative result does not preclude SARS-Cov-2 infection and should not be used as the sole basis for treatment or other patient management decisions. A negative result may occur with  improper specimen collection/handling, submission of specimen other than nasopharyngeal swab, presence of viral mutation(s) within the areas targeted by this assay, and inadequate number of viral copies(<138 copies/mL). A negative result must be combined with clinical observations, patient history, and epidemiological information. The expected result is Negative.  Fact Sheet for  Patients:  EntrepreneurPulse.com.au  Fact Sheet for Healthcare Providers:  IncredibleEmployment.be  This test is no t yet approved or cleared by the [place] FDA and  has been authorized for detection and/or diagnosis of SARS-CoV-2 by FDA under an Emergency Use Authorization (EUA). This EUA will remain  in effect (meaning this test can be used) for the duration of the COVID-19 declaration under Section 564(b)(1) of the Act, 21 U.S.C.section 360bbb-3(b)(1), unless the authorization is terminated  or revoked sooner.       Influenza A by PCR NEGATIVE NEGATIVE Final   Influenza B by PCR NEGATIVE NEGATIVE Final    Comment: (NOTE) The Xpert Xpress SARS-CoV-2/FLU/RSV plus assay is intended as an aid in the diagnosis of influenza from Nasopharyngeal swab specimens and should not be used as a sole basis for treatment. Nasal washings and aspirates are unacceptable for Xpert Xpress SARS-CoV-2/FLU/RSV testing.  Fact Sheet for Patients: EntrepreneurPulse.com.au  Fact Sheet for Healthcare Providers: IncredibleEmployment.be  This test is not yet approved or cleared by the [place] FDA and has been authorized for detection and/or diagnosis of SARS-CoV-2 by FDA under an Emergency Use Authorization (EUA). This EUA will remain in effect (meaning this test can be used) for the duration of the COVID-19 declaration under Section 564(b)(1) of the Act, 21 U.S.C. section 360bbb-3(b)(1), unless the authorization is terminated or revoked.  Performed at [facility], [place] [address] [place], [place] [address]       Radiology Studies: DG Chest 2 View  Result Date: 09/07/2020 CLINICAL DATA:  Shortness of breath. EXAM: CHEST - 2 VIEW COMPARISON:  July 14, 2015 FINDINGS: Cardiomediastinal silhouette is normal. Mediastinal contours appear intact. Calcific atherosclerotic disease and tortuosity of the aorta. There is  no evidence of focal airspace consolidation, pleural effusion or pneumothorax. Osseous structures are without acute abnormality. Soft tissues are grossly normal. IMPRESSION: No active cardiopulmonary disease. Electronically Signed   By: Fidela Salisbury M.D.   On: 09/07/2020 10:58    Scheduled Meds:  amLODipine  5 mg Oral Daily   atorvastatin  80 mg Oral Daily   ezetimibe  10 mg Oral Daily   ferrous gluconate  324 mg Oral BID WC   isosorbide mononitrate  60 mg Oral BID   metoprolol succinate  200 mg Oral QHS   pantoprazole  40 mg Oral Daily   Continuous Infusions:  sodium chloride 50 mL/hr at 09/08/20 0602     LOS: 1 day   Time spent: 35 minutes.  Patrecia Pour, MD [facility] [email or web address] 09/08/2020,  12:04 PM

## 2020-09-08 NOTE — Consult Note (Signed)
Referring Provider: Dr. Orma Flaming Primary Care Physician:  [name], MD Primary Gastroenterologist:  Dr. Watt Climes  Reason for Consultation:  melena  HPI: Kayla Mcclure is a 75 y.o. female with history of recurrent GI bleeds thought to be due to known AVMs, CAD with stent placement in 2009, HTN, HLD, iron deficiency anemia, chronic diastolic CHF, mild asthma, CKD stage 3, PAD, coronary artery stenosis s/p left CEA in 07/2019 presents to the ER with dizziness, SOB, anemia and melanonic stools.  She had a positive hemoccult stool in the ER.   Patient with long-standing history of GI bleeds thought to be due to AVMs, Last small bowel endoscopy 08/08/2019, patient had 2 AVMS doudenum and jejunum, treated with APC. Last capsule endoscopy was 12/2015, showed jejunum active bleeding.    Patient no longer on Plavix, is on baby aspirin. Denies NSAIDs and alcohol use. The past 2 weeks patient was having shortness of breath, dizziness, lower back pain, states this is normally how she feels when she has tenia. She has had intermittent black stools, potentially one this morning per nurse's black. Patient said she also takes iron PO Movements are regular one to 2 times daily. No bright red blood in stool. Denies any abdominal pain, nausea, vomiting. No weight loss.  Patient follows with hematology [facility], Dr. Antony Contras, getting supportive iron and blood transfusions. Last iron transfusion was May 2022.  42 and creatinine of 1.98, INR normal Hemoglobin at 11.8 (13.8 on admission but likely hemoconcetration), platelets 144.   Small bowel endoscopy 08/08/2019 Dr. Therisa Doyne - Normal esophagus. - Normal stomach. - A single non-bleeding angioectasia in the duodenum. Treated with argon plasma coagulation (APC). - A single non-bleeding angioectasia in the jejunum. Treated with argon plasma coagulation (APC). - No specimens collected.  05/15/2019 Endoscopy normal  2017 Endoscopy and  colonoscopy - Small hiatal hernia. - Atrophic gastritis. - Normal duodenal bulb, first portion of the duodenum, second portion of the duodenum, third portion of the duodenum and fourth portion of the duodenum. - Three non-bleeding angiodysplastic lesions in the duodenum. Treated with argon plasma coagulation (APC). - No specimens collected.  Past Medical History:  Diagnosis Date   Acute GI bleeding 08/07/2019   Asthma  10/31/2007   CAD (coronary artery disease) 08/05/2007   a) NSTEMI 2009: PCI to the RCA Promus DES 2.5 mm x 23 mm (3.0 mm). b) Myoview June 2017: Nonspecific ST changes. EF greater than 65%. LOW RISK, normal study. No ischemia or infarction.   Carotid artery disease [facility])    s/p Bilateral CEA March & May 2020.    Chronic back pain    Chronic diastolic heart failure (HCC)    CKD (chronic kidney disease), stage II    Diverticulosis   04/11/2010    Colonoscopy   Dyslipidemia, goal LDL below 70    Essential hypertension    a. Normal renal arteries by PV angio 05/2013.   GERD (gastroesophageal reflux disease)    GI bleed 12/2015   History of kidney stones 1975   History of recent blood transfusion 12/2015   12-19-15, 12-20-15 and 12-21-15   Insomnia    Internal hemorrhoids  04/11/2010   By colonoscopy   Iron deficiency anemia    Myocardial infarction [facility]) 2009   Peripheral arterial disease ([place])    a. s/p PTA and stenting of L CIA stenosis 05/2013.   Renal insufficiency    Stroke [facility])    mini stroke   Tobacco abuse    Quit  in 05/2013    Past Surgical History:  Procedure Laterality Date   CAPSULE ENDOSCOPY  10/27/2019   [facility]) evidence of "pinhole leaking" in both stomach and proximal small bowel; no overt bleeding noted   CHOLECYSTECTOMY  1975   COLONOSCOPY  04/11/2010   COLONOSCOPY  11/30/2015   CORONARY ANGIOPLASTY WITH STENT PLACEMENT  08/05/2007   Promus DES 2.5 mm x 23 mm (3 mm)  to RCA; EF 60-70%   ENDARTERECTOMY Left 07/28/2019   Procedure:  ENDARTERECTOMY CAROTID;  Surgeon: Waynetta Sandy, MD;  Location: [place];  Service: Vascular;  Laterality: Left;   ENDARTERECTOMY Right 08/27/2019   Procedure: ENDARTERECTOMY CAROTID RIGHT WITH PATCH ANGIOPLASTY;  Surgeon: Waynetta Sandy, MD;  Location: [place];  Service: Vascular;  Laterality: Right;   ENTEROSCOPY N/A 12/27/2015   Procedure: ENTEROSCOPY;  Surgeon: Clarene Essex, MD;  Location: [facility] ENDOSCOPY;  Service: Endoscopy;  Laterality: N/A;  also needs slim egd scope   ENTEROSCOPY N/A 05/15/2019   Procedure: ENTEROSCOPY;  Surgeon: Ronald Lobo, MD;  Location: [place];  Service: Endoscopy;  Laterality: N/A;  Push enteroscopy using either ultraslim colonoscope or push enteroscope   ENTEROSCOPY N/A 08/08/2019   Procedure: ENTEROSCOPY;  Surgeon: Ronnette Juniper, MD;  Location: [place];  Service: Gastroenterology;  Laterality: N/A;   ESOPHAGOGASTRODUODENOSCOPY N/A 09/13/2013   Procedure: ESOPHAGOGASTRODUODENOSCOPY (EGD);  Surgeon: Lear Ng, MD;  Location: [facility] ENDOSCOPY;  Service: Endoscopy;  Laterality: N/A;   ESOPHAGOGASTRODUODENOSCOPY N/A 07/14/2015   Procedure: ESOPHAGOGASTRODUODENOSCOPY (EGD);  Surgeon: Clarene Essex, MD;  Location: [facility] ENDOSCOPY;  Service: Endoscopy;  Laterality: N/A;   gi bleed  07/2015   blood given   HOT HEMOSTASIS N/A 12/27/2015   Procedure: HOT HEMOSTASIS (ARGON PLASMA COAGULATION/BICAP);  Surgeon: Clarene Essex, MD;  Location: [facility];  Service: Endoscopy;  Laterality: N/A;   HOT HEMOSTASIS N/A 08/08/2019   Procedure: HOT HEMOSTASIS (ARGON PLASMA COAGULATION/BICAP);  Surgeon: Ronnette Juniper, MD;  Location: [place];  Service: Gastroenterology;  Laterality: N/A;   ILIAC ARTERY STENT Left 05/04/2013   L CIA-EIA -- Dr. Gwenlyn Found   LEFT HEART CATH AND CORONARY ANGIOGRAPHY N/A 02/11/2019   Procedure: LEFT HEART CATH AND CORONARY ANGIOGRAPHY;  Surgeon: Leonie Man, MD;  Location: [place] CV LAB;;-  Patent RCA stent with minimal ISR after pRCA  50%).  Small caliber RPAV with ~75%.  pLAD 30%, m-dLAD 40%, D2 40%.  Normal LVEDP and LVEF.   LOWER EXTREMITY ANGIOGRAM Bilateral 05/04/2013   Procedure: LOWER EXTREMITY ANGIOGRAM;  Surgeon: Lorretta Harp, MD;  Location: [facility] CATH LAB;  Service: Cardiovascular;  Laterality: Bilateral;   NM MYOVIEW LTD  09/07/2015   EF > 65%. Nonspecific ST changes but no ischemic changes. No ischemia or infarction. LOW RISK   PATCH ANGIOPLASTY Left 07/28/2019   Procedure: PATCH ANGIOPLASTY USING Rueben Bash BIOLOGIC PATCH;  Surgeon: Waynetta Sandy, MD;  Location: [place];  Service: Vascular;  Laterality: Left;   TONSILLECTOMY  1960's   TRANSTHORACIC ECHOCARDIOGRAM  05/14/2019   Normal EF 60 to 65%.  No R WMA.   Normal RV but mildly elevated pressures.  Relatively normal aortic and mitral valves.   UPPER GI ENDOSCOPY  12/14/2015    Prior to Admission medications   Medication Sig Start Date End Date Taking? Authorizing Provider  albuterol (VENTOLIN HFA) 108 (90 Base) MCG/ACT inhaler Inhale 2 puffs into the lungs every 6 (six) hours as needed for wheezing or shortness of breath.   Yes [provider]  alendronate (FOSAMAX) 70 MG  tablet Take 70 mg by mouth once a week. 07/26/20  Yes [provider]  aspirin EC 81 MG tablet Take 81 mg by mouth daily.   Yes [provider]  atorvastatin (LIPITOR) 80 MG tablet Take 1 tablet (80 mg total) by mouth daily. 05/27/19  Yes Lendon Colonel, NP  Cholecalciferol (VITAMIN D3) 1.25 MG (50000 UT) CAPS Take 1 capsule by mouth once a week. 08/09/20  Yes [provider]  ezetimibe (ZETIA) 10 MG tablet Take 1 tablet (10 mg total) by mouth daily. 05/27/19  Yes Lendon Colonel, NP  ferrous gluconate (FERGON) 324 MG tablet Take 1 tablet (324 mg total) by mouth 2 (two) times daily with a meal. 07/15/15  Yes Rai, Ripudeep K, MD  furosemide (LASIX) 20 MG tablet Take 20 mg  One tablet on Monday , Wednesday , Fridays , may take an additional  20 mg  ff needed Patient taking differently: Take 20 mg by mouth See admin instructions. Take One tablet by mouth  on Monday , Wednesday , Fridays 04/15/20  Yes Leonie Man, MD  isosorbide mononitrate (IMDUR) 60 MG 24 hr tablet TAKE 1 AND 1/2 TABLETS BY  MOUTH DAILY Patient taking differently: Take 60 mg by mouth in the morning and at bedtime. 07/18/20  Yes Leonie Man, MD  metoprolol succinate (TOPROL-XL) 100 MG 24 hr tablet Take 2 tablets (200 mg total) by mouth at bedtime. 06/01/19  Yes Lendon Colonel, NP  nitroGLYCERIN (NITROSTAT) 0.4 MG SL tablet Place 1 tablet (0.4 mg total) under the tongue every 5 (five) minutes as needed for chest pain. 02/04/19  Yes Leonie Man, MD  ondansetron (ZOFRAN) 4 MG tablet Take 1 tablet (4 mg total) by mouth every 6 (six) hours as needed for nausea. 08/09/19  Yes Georgette Shell, MD  pantoprazole (PROTONIX) 40 MG tablet Take 1 tablet (40 mg total) by mouth daily. 09/14/13  Yes Ghimire, Henreitta Leber, MD  valsartan (DIOVAN) 40 MG tablet Take 1 tablet by mouth once daily Patient taking differently: Take 40 mg by mouth daily. 05/23/20  Yes Rodriguez-Guzman, Raquel, RPH-CPP  amLODipine (NORVASC) 5 MG tablet Take 1 tablet (5 mg total) by mouth daily. 01/11/20 04/10/20  Leonie Man, MD    Scheduled Meds:  amLODipine  5 mg Oral Daily   atorvastatin  80 mg Oral Daily   ezetimibe  10 mg Oral Daily   ferrous gluconate  324 mg Oral BID WC   isosorbide mononitrate  60 mg Oral BID   metoprolol succinate  200 mg Oral QHS   pantoprazole  40 mg Oral Daily   Continuous Infusions:  sodium chloride 50 mL/hr at 09/08/20 0602   PRN Meds:.acetaminophen **OR** acetaminophen, albuterol, HYDROcodone-acetaminophen, nitroGLYCERIN, ondansetron  Allergies as of 09/07/2020   (No Known Allergies)    Family History  Problem Relation Age of Onset   Diabetes Mother    Heart disease Mother    Heart attack Mother 46   Breast cancer Mother 30   Colon cancer Mother 55    Heart attack Father 34   Pneumonia Father    Diabetes Sister    Breast cancer Sister    Breast cancer Sister 36    Social History   Socioeconomic History   Marital status: Married    Spouse name: Not on file   Number of children: 1   Years of education: 12   Highest education level: High school graduate  Occupational History    Employer:  RETIRED  Tobacco Use   Smoking status: Light Smoker    Pack years: 0.00    Types: Cigarettes    Last attempt to quit: 05/04/2013    Years since quitting: 7.3   Smokeless tobacco: Never   Tobacco comments:    She initially quit, but has gone back to smoking a couple cigarettes every now and then.  Vaping Use   Vaping Use: Never used  Substance and Sexual Activity   Alcohol use: Yes    Comment: occasionally 3 times a year   Drug use: No   Sexual activity: Not Currently    Partners: Male    Birth control/protection: None  Other Topics Concern   Not on file  Social History Narrative   Married. Has one child. Grandmother for a great-grandmother and 1.   No real exercise.   Occasional alcohol consumption.   Quit smoking inf Feb 2015 -- after L Iliac Stent   Right-handed   Caffeine: 1 cup every morning   Right-handed.   Lives at home with husband.   Social Determinants of Health   Financial Resource Strain: Not on file  Food Insecurity: Not on file  Transportation Needs: Not on file  Physical Activity: Not on file  Stress: Not on file  Social Connections: Not on file  Intimate Partner Violence: Not on file    Review of Systems:  Review of Systems  Constitutional:  Positive for malaise/fatigue. Negative for chills, fever and weight loss.  HENT:  Negative for hearing loss.   Eyes:  Negative for redness.  Respiratory:  Positive for shortness of breath. Negative for cough and wheezing.   Cardiovascular:  Negative for chest pain and leg swelling.  Gastrointestinal:  Positive for melena. Negative for abdominal pain, blood in stool,  constipation, diarrhea, heartburn, nausea and vomiting.  Skin:  Negative for itching.  Neurological:  Negative for loss of consciousness.  Psychiatric/Behavioral:  Negative for memory loss.     Physical Exam: Vital signs: Vitals:   09/08/20 0525 09/08/20 0800  BP: (!) 128/59   Pulse: 64   Resp: 16   Temp: 98.4 F (36.9 C)   SpO2: (!) 89% 93%   Last BM Date: 09/08/20 Physical Exam Constitutional:      General: She is not in acute distress.    Appearance: She is well-developed. She is not ill-appearing.  HENT:     Head: Normocephalic and atraumatic.  Cardiovascular:     Rate and Rhythm: Normal rate and regular rhythm.  Pulmonary:     Effort: Pulmonary effort is normal.     Breath sounds: Normal breath sounds.  Abdominal:     General: Bowel sounds are normal.     Palpations: Abdomen is soft. There is no mass.     Tenderness: There is no abdominal tenderness. There is no guarding or rebound.  Musculoskeletal:        General: Normal range of motion.  Skin:    General: Skin is warm and dry.  Neurological:     General: No focal deficit present.     Mental Status: She is alert.     GI:  Lab Results: Recent Labs    09/07/20 1004 09/07/20 1857 09/08/20 0021  WBC 6.4  --  6.6  HGB 14.5 13.8 11.8*  HCT 45.1 42.3 36.8  PLT 173  --  144*   BMET Recent Labs    09/07/20 1004 09/08/20 0021  NA 139 138  K 4.1 3.8  CL 103 104  CO2 27 27  GLUCOSE 93 83  BUN 42* 31*  CREATININE 1.98* 1.50*  CALCIUM 9.3 8.6*   LFT Recent Labs    09/08/20 0021  PROT 6.4*  ALBUMIN 2.9*  AST 17  ALT 12  ALKPHOS 36*  BILITOT 0.8   PT/INR Recent Labs    09/08/20 0832  LABPROT 13.0  INR 1.0    Studies/Results: DG Chest 2 View  Result Date: 09/07/2020 CLINICAL DATA:  Shortness of breath. EXAM: CHEST - 2 VIEW COMPARISON:  July 14, 2015 FINDINGS: Cardiomediastinal silhouette is normal. Mediastinal contours appear intact. Calcific atherosclerotic disease and tortuosity of the  aorta. There is no evidence of focal airspace consolidation, pleural effusion or pneumothorax. Osseous structures are without acute abnormality. Soft tissues are grossly normal. IMPRESSION: No active cardiopulmonary disease. Electronically Signed   By: Fidela Salisbury M.D.   On: 09/07/2020 10:58    Impression and Plan Anemia, melena in patient with history of AVM bleeds, last EGD 10/2019 with 2 AVMS Patient had melena this AM, BUN increased from admission, and her H/H has decreased, will schedule for small bowel enterscopy.  will do clear liquids today, NPO at midnight for Endoscopy.  Stop ASA Continue protonix twice daily and supportive care.  Continue to monitor H&H with transfusion as needed to maintain hemoglobin greater than 7.    LOS: 1 day   Vladimir Crofts  PA-C 09/08/2020, 11:58 AM  Contact #  [phone]

## 2020-09-08 NOTE — Plan of Care (Signed)

## 2020-09-09 ENCOUNTER — Encounter (HOSPITAL_COMMUNITY)
Admission: EM | Disposition: A | Discharge status: Home / Self Care | Payer: Self-pay | Source: Home / Self Care | Attending: Family Medicine

## 2020-09-09 ENCOUNTER — Encounter (HOSPITAL_COMMUNITY): Payer: Self-pay | Admitting: Family Medicine

## 2020-09-09 ENCOUNTER — Inpatient Hospital Stay (HOSPITAL_COMMUNITY): Payer: Medicare Other | Admitting: Certified Registered Nurse Anesthetist

## 2020-09-09 HISTORY — PX: HOT HEMOSTASIS: SHX5433

## 2020-09-09 HISTORY — PX: ESOPHAGOGASTRODUODENOSCOPY (EGD) WITH PROPOFOL: SHX5813

## 2020-09-09 LAB — BASIC METABOLIC PANEL
Anion gap: 7 (ref 5–15)
BUN: 20 mg/dL (ref 8–23)
CO2: 25 mmol/L (ref 22–32)
Calcium: 8.6 mg/dL — ABNORMAL LOW (ref 8.9–10.3)
Chloride: 105 mmol/L (ref 98–111)
Creatinine, Ser: 1.14 mg/dL — ABNORMAL HIGH (ref 0.44–1.00)
GFR, Estimated: 51 mL/min — ABNORMAL LOW (ref >60)
Glucose, Bld: 85 mg/dL (ref 70–99)
Potassium: 3.8 mmol/L (ref 3.5–5.1)
Sodium: 137 mmol/L (ref 135–145)

## 2020-09-09 LAB — CBC
HCT: 33.9 % — ABNORMAL LOW (ref 36.0–46.0)
Hemoglobin: 11 g/dL — ABNORMAL LOW (ref 12.0–15.0)
MCH: 29.5 pg (ref 26.0–34.0)
MCHC: 32.4 g/dL (ref 30.0–36.0)
MCV: 90.9 fL (ref 80.0–100.0)
Platelets: 134 10*3/uL — ABNORMAL LOW (ref 150–400)
RBC: 3.73 MIL/uL — ABNORMAL LOW (ref 3.87–5.11)
RDW: 17.2 % — ABNORMAL HIGH (ref 11.5–15.5)
WBC: 6 10*3/uL (ref 4.0–10.5)
nRBC: 0 % (ref 0.0–0.2)

## 2020-09-09 SURGERY — ESOPHAGOGASTRODUODENOSCOPY (EGD) WITH PROPOFOL
Anesthesia: General

## 2020-09-09 MED ORDER — PROPOFOL 500 MG/50ML IV EMUL
INTRAVENOUS | Status: DC | PRN
  Administered 2020-09-09: 100 ug/kg/min via INTRAVENOUS

## 2020-09-09 MED ORDER — PROPOFOL 10 MG/ML IV BOLUS
INTRAVENOUS | Status: DC | PRN
  Administered 2020-09-09 (×2): 20 mg via INTRAVENOUS

## 2020-09-09 MED ORDER — LIDOCAINE 2% (20 MG/ML) 5 ML SYRINGE
INTRAMUSCULAR | Status: DC | PRN
  Administered 2020-09-09: 40 mg via INTRAVENOUS

## 2020-09-09 MED ORDER — ASPIRIN EC 81 MG PO TBEC: 81.0000 mg | DELAYED_RELEASE_TABLET | Freq: Every day | ORAL | Status: AC

## 2020-09-09 SURGICAL SUPPLY — 15 items

## 2020-09-09 NOTE — Transfer of Care (Signed)
Immediate Anesthesia Transfer of Care Note  Patient: Kayla Mcclure  Procedure(s) Performed: ESOPHAGOGASTRODUODENOSCOPY (EGD) WITH PROPOFOL HOT HEMOSTASIS (ARGON PLASMA COAGULATION/BICAP)  Patient Location: Endoscopy Unit  Anesthesia Type:MAC  Level of Consciousness: sedated  Airway & Oxygen Therapy: Patient Spontanous Breathing and Patient connected to nasal cannula oxygen  Post-op Assessment: Report given to RN and Post -op Vital signs reviewed and stable  Post vital signs: Reviewed and stable  Last Vitals:  Vitals Value Taken Time  BP    Temp    Pulse    Resp    SpO2      Last Pain:  Vitals:   09/09/20 1142  TempSrc: Oral  PainSc: 0-No pain         Complications: No notable events documented.

## 2020-09-09 NOTE — Op Note (Addendum)
[facility] Patient Name: Kayla Mcclure Procedure Date : 09/09/2020 MRN: [id number] Attending MD: Clarene Essex , MD Date of Birth: 1945-09-26 CSN: [id number] Age: 75 Admit Type: Outpatient Procedure:                Upper GI endoscopy Indications:              Iron deficiency anemia secondary to chronic blood                            loss, Melena Providers:                Clarene Essex, MD, Baird Cancer, RN, Ladona Ridgel,                            Technician Referring MD:              Medicines:                Propofol total dose 125 mg IV, 40 mg IV lidocaine Complications:            No immediate complications. Estimated Blood Loss:     Estimated blood loss: none. Procedure:                Pre-Anesthesia Assessment:                           - Prior to the procedure, a History and Physical                            was performed, and patient medications and                            allergies were reviewed. The patient's tolerance of                            previous anesthesia was also reviewed. The risks                            and benefits of the procedure and the sedation                            options and risks were discussed with the patient.                            All questions were answered, and informed consent                            was obtained. Prior Anticoagulants: The patient has                            taken no previous anticoagulant or antiplatelet                            agents except for aspirin. ASA Grade Assessment:  III - A patient with severe systemic disease. After                            reviewing the risks and benefits, the patient was                            deemed in satisfactory condition to undergo the                            procedure.                           After obtaining informed consent, the endoscope was                            passed under direct vision. Throughout the                             procedure, the patient's blood pressure, pulse, and                            oxygen saturations were monitored continuously. The                            [id number]-PH190L ([id number] Olympus ultra slim colonoscope                            was introduced through the mouth, and advanced to                            the jejunum. The upper GI endoscopy was                            accomplished without difficulty. The patient                            tolerated the procedure well. Scope In: Scope Out: Findings:      The examined esophagus was normal.      Diffuse moderate inflammation characterized by congestion (edema) and       erythema was found in the entire examined stomach.      A single small angiodysplastic lesion without bleeding was found in the       fourth portion of the duodenum. Fulguration to ablate the lesion to       prevent bleeding by argon plasma at 2 liters/minute and 20 watts was       successful.      The examined jejunum was normal. We advanced the scope multiple times to       the entire length of the ultraslim colonoscope and withdrew loops and       readvanced further until we could advance no further      The exam was otherwise without abnormality. Impression:               - Normal esophagus.                           -  Chronic atrophic gastritis.                           - A single non-bleeding angiodysplastic lesion in                            the duodenum. Treated with argon plasma coagulation                            (APC).                           - Normal examined jejunum.                           - The examination was otherwise normal. And no                            signs of active bleeding                           - No specimens collected. Recommendation:           - Soft diet today. If no delayed complications okay                            with me to go home later today                           - Continue  present medications.                           - Return to GI clinic in 2 weeks.                           - Telephone GI clinic if symptomatic PRN. Procedure Code(s):        --- Professional ---                           [phone], Esophagogastroduodenoscopy, flexible,                            transoral; with control of bleeding, any method Diagnosis Code(s):        --- Professional ---                           K29.40, Chronic atrophic gastritis without bleeding                           K31.819, Angiodysplasia of stomach and duodenum                            without bleeding                           D50.0, Iron deficiency anemia secondary to blood  loss (chronic)                           K92.1, Melena (includes Hematochezia) CPT copyright 2019 American [employer] Association. All rights reserved. The codes documented in this report are preliminary and upon coder review may  be revised to meet current compliance requirements. Clarene Essex, MD 09/09/2020 1:21:28 PM This report has been signed electronically. Number of Addenda: 0

## 2020-09-09 NOTE — Anesthesia Procedure Notes (Signed)
Procedure Name: MAC Date/Time: 09/09/2020 12:31 PM Performed by: Colin Benton, CRNA Pre-anesthesia Checklist: Patient identified, Emergency Drugs available, Suction available and Patient being monitored Patient Re-evaluated:Patient Re-evaluated prior to induction Oxygen Delivery Method: Nasal cannula Induction Type: IV induction Placement Confirmation: positive ETCO2 Dental Injury: Teeth and Oropharynx as per pre-operative assessment

## 2020-09-09 NOTE — Plan of Care (Signed)

## 2020-09-09 NOTE — Progress Notes (Signed)
Kayla Mcclure Needs to be D/C'd  per MD order.  Discussed with the patient and all questions fully answered.  VSS, Skin clean, dry and intact without evidence of skin break down, no evidence of skin tears noted.  IV catheter discontinued intact. Site without signs and symptoms of complications. Dressing and pressure applied.  An After Visit Summary was printed and given to the patient. Patient received prescription.  D/c education completed with patient/family including follow up instructions, medication list, d/c activities limitations if indicated, with other d/c instructions as indicated by MD - patient able to verbalize understanding, all questions fully answered.   Patient instructed to return to ED, call 911, or call MD for any changes in condition.   Patient to be escorted via [place], and D/C home via private auto.

## 2020-09-09 NOTE — Plan of Care (Signed)
  Problem: Education: Goal: Knowledge of General Education information will improve Description: Including pain rating scale, medication(s)/side effects and non-pharmacologic comfort measures 09/09/2020 1405 by Camillia Herter, RN Outcome: Adequate for Discharge 09/09/2020 1405 by Camillia Herter, RN Outcome: Progressing   Problem: Health Behavior/Discharge Planning: Goal: Ability to manage health-related needs will improve 09/09/2020 1405 by Camillia Herter, RN Outcome: Adequate for Discharge 09/09/2020 1405 by Camillia Herter, RN Outcome: Progressing   Problem: Clinical Measurements: Goal: Ability to maintain clinical measurements within normal limits will improve 09/09/2020 1405 by Camillia Herter, RN Outcome: Adequate for Discharge 09/09/2020 1405 by Camillia Herter, RN Outcome: Progressing Goal: Will remain free from infection 09/09/2020 1405 by Camillia Herter, RN Outcome: Adequate for Discharge 09/09/2020 1405 by Camillia Herter, RN Outcome: Progressing Goal: Diagnostic test results will improve 09/09/2020 1405 by Camillia Herter, RN Outcome: Adequate for Discharge 09/09/2020 1405 by Camillia Herter, RN Outcome: Progressing Goal: Respiratory complications will improve 09/09/2020 1405 by Camillia Herter, RN Outcome: Adequate for Discharge 09/09/2020 1405 by Camillia Herter, RN Outcome: Progressing Goal: Cardiovascular complication will be avoided 09/09/2020 1405 by Camillia Herter, RN Outcome: Adequate for Discharge 09/09/2020 1405 by Camillia Herter, RN Outcome: Progressing   Problem: Activity: Goal: Risk for activity intolerance will decrease 09/09/2020 1405 by Camillia Herter, RN Outcome: Adequate for Discharge 09/09/2020 1405 by Camillia Herter, RN Outcome: Progressing   Problem: Nutrition: Goal: Adequate nutrition will be maintained 09/09/2020 1405 by Camillia Herter, RN Outcome: Adequate for Discharge 09/09/2020 1405 by Camillia Herter, RN Outcome:  Progressing   Problem: Coping: Goal: Level of anxiety will decrease 09/09/2020 1405 by Camillia Herter, RN Outcome: Adequate for Discharge 09/09/2020 1405 by Camillia Herter, RN Outcome: Progressing   Problem: Elimination: Goal: Will not experience complications related to bowel motility 09/09/2020 1405 by Camillia Herter, RN Outcome: Adequate for Discharge 09/09/2020 1405 by Camillia Herter, RN Outcome: Progressing Goal: Will not experience complications related to urinary retention 09/09/2020 1405 by Camillia Herter, RN Outcome: Adequate for Discharge 09/09/2020 1405 by Camillia Herter, RN Outcome: Progressing   Problem: Pain Managment: Goal: General experience of comfort will improve 09/09/2020 1405 by Camillia Herter, RN Outcome: Adequate for Discharge 09/09/2020 1405 by Camillia Herter, RN Outcome: Progressing   Problem: Safety: Goal: Ability to remain free from injury will improve 09/09/2020 1405 by Camillia Herter, RN Outcome: Adequate for Discharge 09/09/2020 1405 by Camillia Herter, RN Outcome: Progressing   Problem: Skin Integrity: Goal: Risk for impaired skin integrity will decrease 09/09/2020 1405 by Camillia Herter, RN Outcome: Adequate for Discharge 09/09/2020 1405 by Camillia Herter, RN Outcome: Progressing

## 2020-09-09 NOTE — Anesthesia Postprocedure Evaluation (Signed)
Anesthesia Post Note  Patient: Kayla Mcclure  Procedure(s) Performed: ESOPHAGOGASTRODUODENOSCOPY (EGD) WITH PROPOFOL HOT HEMOSTASIS (ARGON PLASMA COAGULATION/BICAP)     Patient location during evaluation: Endoscopy Anesthesia Type: General Level of consciousness: awake Pain management: pain level controlled Vital Signs Assessment: post-procedure vital signs reviewed and stable Respiratory status: spontaneous breathing Cardiovascular status: stable Postop Assessment: no apparent nausea or vomiting Anesthetic complications: no   No notable events documented.  Last Vitals:  Vitals:   09/09/20 1359 09/09/20 1524  BP: (!) 135/55 (!) 103/54  Pulse: (!) 55 66  Resp: 18 16  Temp: 36.8 C 36.9 C  SpO2: 96% 94%    Last Pain:  Vitals:   09/09/20 1524  TempSrc: Oral  PainSc:                  Art Levan

## 2020-09-09 NOTE — Progress Notes (Signed)
Kayla Mcclure Needs 12:23 PM  Subjective: Patient asymptomatic without any further bowel movements or signs of bleeding and no new complaints  Objective: Vital signs stable afebrile exam please see preassessment evaluation hemoglobin okay very minimal drop BUN okay some decrease of that and creatinine with hydration  Assessment: GI bleeding probably small bowel AVMs  Plan: Okay to proceed with enteroscope today with anesthesia assistance  [facility]  office [phone] After 5PM or if no answer call [phone]

## 2020-09-09 NOTE — Discharge Summary (Signed)
Physician Discharge Summary  Kayla Mcclure QBH:[id number] DOB: January 07, 1946 DOA: 09/07/2020  PCP: [name], MD  Admit date: 09/07/2020 Discharge date: 09/09/2020  Admitted From: Home Disposition: Home   Recommendations for Outpatient Follow-up:  Follow up with PCP in 1-2 weeks with repeat CBC Follow up with [facility] in 2 weeks and prn  Home Health: None Equipment/Devices: None Discharge Condition: Stable CODE STATUS: Full Diet recommendation: Heart healthy  Brief/Interim Summary: Kayla Mcclure is a 75 y.o. 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 She developed symptoms similar to prior episodes of GI bleeding, namely dyspnea on exertion, lightheadedness, and back pain about 2 weeks ago and has continued to have dark stools attributable to iron supplement. She presented initially to urgent care, referred to ED due to reported hypoxia. VSS in ED, Hgb 14.5, +FOBT, AKI on admission. She has been noted to have borderline hypoxia intermittently but is not hypoxic at the time of discharge and has no respiratory complaints.    Hx of jejunal AVMs noted in 2017 s/p treatment with APC. She has hx of 3 GI bleeds this year, but has been going on x 2 years. Last gastric bleed was in 4/22 (these are recurrent) last EGD was at this time (she thinks it was normal) and her last colonoscopy was in 03/2020. She states her colonoscopy was normal.  She also did a capsule study x 2 in 5/21 and 2/21.  EGD performed 6/10 revealed chronic atrophic gastritis and a duodenal AVM treated with APC. Since patient is not symptomatic and hgb is stable, plan is to confirm no issues with taking soft diet and will be discharged in stable condition.  Discharge Diagnoses:  Principal Problem:   GI bleed Active  Problems:   Iron deficiency anemia   Essential hypertension, benign   CAD S/P percutaneous coronary angioplasty -- Promus DES 2.5 mm x 20 mm postdilated to 3 mm   Asthma   Dyslipidemia, goal LDL below 70   AKI (acute kidney injury) ([place])   Chronic diastolic CHF (congestive heart failure) (HCC)   CKD (chronic kidney disease) stage 3, GFR 30-59 ml/min (HCC)   Dizziness  Symptomatic acute on chronic blood loss and iron deficiency anemia due to recurrent GI bleed presumed to be due to AVM, duodenal AVM found on EGD: - Hgb declined 14 > 11 but remaining stable since that time. Plan to recheck CBC in the next 1-2 weeks and follow up with GI.  - Hold aspirin for 3 days (discussed with GI). - Due for routine iron infusion Monday per hematology.    Acute hypoxic respiratory failure: Pt with longstanding history of tobacco use, likely has obstructive pulmonary disease, known asthma, but unremarkable CXR and has been weaned to room air consistently without pulmonary symptoms.  - Continue bronchodilators. Encourage IS.   AKI on stage IIIa CKD: Presumptive Dx based on creatinine trends. - BUN: Cr elevated in prerenal ratio, though upper GI bleed could be to blame as well. Improved with IVF.   CAD s/p DES remotely: No chest pain. Troponin wnl x2. - Continue BB, statin, hold ASA for 3 days.   Dyslipidemia: - Continue statin, zetia. LDL 50 in April 2022.   Chronic HFpEF: last echo 05/14/2019 with EF of 60-65%. Grade 2 diastolic dysfunction. Moderately elevated pulmonary artery  systolic pressure. - Continue home medications at discharge.    Thrombocytopenia: Stable.  Discharge Instructions Discharge Instructions     Diet - low sodium heart healthy   Complete by: As directed    Discharge instructions   Complete by: As directed    You were evaluated for GI bleeding found to have a vascular malformation in the small intestine (duodenum) that was treated. You are stable for discharge though should  hold aspirin until Monday. Seek medical attention right away if your symptoms return. Otherwise, follow up with your PCP and [facility] as well as hematology for your iron infusion.   Increase activity slowly   Complete by: As directed       Allergies as of 09/09/2020   No Known Allergies      Medication List     TAKE these medications    albuterol 108 (90 Base) MCG/ACT inhaler Commonly known as: VENTOLIN HFA Inhale 2 puffs into the lungs every 6 (six) hours as needed for wheezing or shortness of breath.   alendronate 70 MG tablet Commonly known as: FOSAMAX Take 70 mg by mouth once a week.   amLODipine 5 MG tablet Commonly known as: NORVASC Take 1 tablet (5 mg total) by mouth daily.   aspirin EC 81 MG tablet Take 1 tablet (81 mg total) by mouth daily. Start taking on: September 12, 2020 What changed: These instructions start on September 12, 2020. If you are unsure what to do until then, ask your doctor or other care provider.   atorvastatin 80 MG tablet Commonly known as: LIPITOR Take 1 tablet (80 mg total) by mouth daily.   ezetimibe 10 MG tablet Commonly known as: ZETIA Take 1 tablet (10 mg total) by mouth daily.   ferrous gluconate 324 MG tablet Commonly known as: FERGON Take 1 tablet (324 mg total) by mouth 2 (two) times daily with a meal.   furosemide 20 MG tablet Commonly known as: LASIX Take 20 mg  One tablet on Monday , Wednesday , Fridays , may take an additional  20 mg ff needed What changed:  how much to take how to take this when to take this additional instructions   isosorbide mononitrate 60 MG 24 hr tablet Commonly known as: IMDUR TAKE 1 AND 1/2 TABLETS BY  MOUTH DAILY What changed:  how much to take when to take this   metoprolol succinate 100 MG 24 hr tablet Commonly known as: TOPROL-XL Take 2 tablets (200 mg total) by mouth at bedtime.   nitroGLYCERIN 0.4 MG SL tablet Commonly known as: NITROSTAT Place 1 tablet (0.4 mg total) under the tongue  every 5 (five) minutes as needed for chest pain.   ondansetron 4 MG tablet Commonly known as: ZOFRAN Take 1 tablet (4 mg total) by mouth every 6 (six) hours as needed for nausea.   pantoprazole 40 MG tablet Commonly known as: PROTONIX Take 1 tablet (40 mg total) by mouth daily.   valsartan 40 MG tablet Commonly known as: DIOVAN Take 1 tablet by mouth once daily   Vitamin D3 1.25 MG (50000 UT) Caps Take 1 capsule by mouth once a week.        Follow-up Information     [name], MD .   Specialty: Family Medicine Contact information: [place] [address] [phone]         Clarene Essex, MD Follow up.   Specialty: Gastroenterology Contact information: [address] [place] [address]  [phone]                No Known Allergies  Consultations: GI  Procedures/Studies: DG Chest 2 View  Result Date: 09/07/2020 CLINICAL DATA:  Shortness of breath. EXAM: CHEST - 2 VIEW COMPARISON:  July 14, 2015 FINDINGS: Cardiomediastinal silhouette is normal. Mediastinal contours appear intact. Calcific atherosclerotic disease and tortuosity of the aorta. There is no evidence of focal airspace consolidation, pleural effusion or pneumothorax. Osseous structures are without acute abnormality. Soft tissues are grossly normal. IMPRESSION: No active cardiopulmonary disease. Electronically Signed   By: Fidela Salisbury M.D.   On: 09/07/2020 10:58    09/09/2020 Dr. Watt Climes EGD: Impression:       - Normal esophagus.                           - Chronic atrophic gastritis.                           - A single non-bleeding angiodysplastic lesion in                           the duodenum. Treated with argon plasma coagulation                           (APC).                           - Normal examined jejunum.                           - The examination was otherwise normal. And no                           signs of active bleeding                            - No specimens collected. Recommendation: - Soft diet today. If no delayed complications okay                           with me to go home later today                           - Continue present medications.                           - Return to GI clinic in 2 weeks.                           - Telephone GI clinic if symptomatic PRN.  Subjective: Feels well. No bleeding noted. No abd pain, N/V/D. Seen before EGD and again afterward, feels well and still wants to go home ASAP. Will have her eat soft diet prior to discharge.  Discharge Exam: Vitals:   09/09/20 1330 09/09/20 1359  BP: (!) 158/63 (!) 135/55  Pulse: (!) 59 (!) 55  Resp: (!) 21 18  Temp:  98.2 F (36.8 C)  SpO2: 98% 96%   General: Pt is alert, awake, not in acute distress Cardiovascular: RRR, S1/S2 +,  no rubs, no gallops Respiratory: CTA bilaterally, no wheezing, no rhonchi Abdominal: Soft, NT, ND, bowel sounds + Extremities: No edema, no cyanosis  Labs: BNP (last 3 results) Recent Labs    09/07/20 1215  BNP 017.7*   Basic Metabolic Panel: Recent Labs  Lab 09/07/20 1004 09/08/20 0021 09/09/20 0015  NA 139 138 137  K 4.1 3.8 3.8  CL 103 104 105  CO2 27 27 25   GLUCOSE 93 83 85  BUN 42* 31* 20  CREATININE 1.98* 1.50* 1.14*  CALCIUM 9.3 8.6* 8.6*   Liver Function Tests: Recent Labs  Lab 09/07/20 1004 09/08/20 0021  AST 20 17  ALT 14 12  ALKPHOS 47 36*  BILITOT 0.9 0.8  PROT 7.7 6.4*  ALBUMIN 3.6 2.9*   No results for input(s): LIPASE, AMYLASE in the last 168 hours. No results for input(s): AMMONIA in the last 168 hours. CBC: Recent Labs  Lab 09/07/20 1004 09/07/20 1857 09/08/20 0021 09/08/20 1246 09/09/20 0015  WBC 6.4  --  6.6 6.5 6.0  HGB 14.5 13.8 11.8* 12.5 11.0*  HCT 45.1 42.3 36.8 38.2 33.9*  MCV 90.7  --  90.4 90.3 90.9  PLT 173  --  144* 146* 134*   Cardiac Enzymes: No results for input(s): CKTOTAL, CKMB, CKMBINDEX, TROPONINI in the last 168  hours. BNP: Invalid input(s): POCBNP CBG: No results for input(s): GLUCAP in the last 168 hours. D-Dimer No results for input(s): DDIMER in the last 72 hours. Hgb A1c No results for input(s): HGBA1C in the last 72 hours. Lipid Profile No results for input(s): CHOL, HDL, LDLCALC, TRIG, CHOLHDL, LDLDIRECT in the last 72 hours. Thyroid function studies No results for input(s): TSH, T4TOTAL, T3FREE, THYROIDAB in the last 72 hours.  Invalid input(s): FREET3 Anemia work up No results for input(s): VITAMINB12, FOLATE, FERRITIN, TIBC, IRON, RETICCTPCT in the last 72 hours. Urinalysis    Component Value Date/Time   COLORURINE STRAW (A) 08/25/2019 0931   APPEARANCEUR CLEAR 08/25/2019 0931   LABSPEC 1.008 08/25/2019 0931   PHURINE 6.0 08/25/2019 0931   GLUCOSEU NEGATIVE 08/25/2019 0931   HGBUR NEGATIVE 08/25/2019 0931   HGBUR negative 06/07/2009 0925   BILIRUBINUR NEGATIVE 08/25/2019 0931   KETONESUR NEGATIVE 08/25/2019 0931   PROTEINUR 30 (A) 08/25/2019 0931   UROBILINOGEN 0.2 06/07/2009 0925   NITRITE NEGATIVE 08/25/2019 0931   LEUKOCYTESUR NEGATIVE 08/25/2019 0931    Microbiology Recent Results (from the past 240 hour(s))  Resp Panel by RT-PCR (Flu A&B, Covid) Nasopharyngeal Swab     Status: None   Collection Time: 09/07/20 11:38 AM   Specimen: Nasopharyngeal Swab; Nasopharyngeal(NP) swabs in vial transport medium  Result Value Ref Range Status   SARS Coronavirus 2 by RT PCR NEGATIVE NEGATIVE Final    Comment: (NOTE) SARS-CoV-2 target nucleic acids are NOT DETECTED.  The SARS-CoV-2 RNA is generally detectable in upper respiratory specimens during the acute phase of infection. The lowest concentration of SARS-CoV-2 viral copies this assay can detect is 138 copies/mL. A negative result does not preclude SARS-Cov-2 infection and should not be used as the sole basis for treatment or other patient management decisions. A negative result may occur with  improper specimen  collection/handling, submission of specimen other than nasopharyngeal swab, presence of viral mutation(s) within the areas targeted by this assay, and inadequate number of viral copies(<138 copies/mL). A negative result must be combined with clinical observations, patient history, and epidemiological information. The expected result is Negative.  Fact Sheet for Patients:  EntrepreneurPulse.com.au  Fact Sheet for Healthcare Providers:  IncredibleEmployment.be  This test is no t yet approved or cleared by the [place] FDA and  has been authorized for detection and/or diagnosis of SARS-CoV-2 by FDA under an Emergency Use Authorization (EUA). This EUA will remain  in effect (meaning this test can be used) for the duration of the COVID-19 declaration under Section 564(b)(1) of the Act, 21 U.S.C.section 360bbb-3(b)(1), unless the authorization is terminated  or revoked sooner.       Influenza A by PCR NEGATIVE NEGATIVE Final   Influenza B by PCR NEGATIVE NEGATIVE Final    Comment: (NOTE) The Xpert Xpress SARS-CoV-2/FLU/RSV plus assay is intended as an aid in the diagnosis of influenza from Nasopharyngeal swab specimens and should not be used as a sole basis for treatment. Nasal washings and aspirates are unacceptable for Xpert Xpress SARS-CoV-2/FLU/RSV testing.  Fact Sheet for Patients: EntrepreneurPulse.com.au  Fact Sheet for Healthcare Providers: IncredibleEmployment.be  This test is not yet approved or cleared by the [place] FDA and has been authorized for detection and/or diagnosis of SARS-CoV-2 by FDA under an Emergency Use Authorization (EUA). This EUA will remain in effect (meaning this test can be used) for the duration of the COVID-19 declaration under Section 564(b)(1) of the Act, 21 U.S.C. section 360bbb-3(b)(1), unless the authorization is terminated or revoked.  Performed at [facility], [place] [address], [place], [place] [address]     Time coordinating discharge: Approximately 40 minutes  Patrecia Pour, MD  [facility] 09/09/2020, 2:25 PM

## 2020-09-09 NOTE — Anesthesia Preprocedure Evaluation (Signed)
Anesthesia Evaluation  Patient identified by MRN, date of birth, ID band Patient awake    Reviewed: Allergy & Precautions, NPO status , Patient's Chart, lab work & pertinent test results  Airway Mallampati: II  TM Distance: >3 FB     Dental   Pulmonary asthma , Current Smoker,    breath sounds clear to auscultation       Cardiovascular hypertension, + angina + CAD, + Past MI, + Peripheral Vascular Disease, +CHF and + DOE   Rhythm:Regular Rate:Normal     Neuro/Psych CVA    GI/Hepatic Neg liver ROS, GERD  ,  Endo/Other  negative endocrine ROS  Renal/GU Renal disease     Musculoskeletal   Abdominal   Peds  Hematology  (+) anemia ,   Anesthesia Other Findings   Reproductive/Obstetrics                             Anesthesia Physical Anesthesia Plan  ASA: 3  Anesthesia Plan: General   Post-op Pain Management:    Induction: Intravenous  PONV Risk Score and Plan: 2 and Ondansetron, Dexamethasone and Midazolam  Airway Management Planned: Simple Face Mask  Additional Equipment:   Intra-op Plan:   Post-operative Plan:   Informed Consent: I have reviewed the patients History and Physical, chart, labs and discussed the procedure including the risks, benefits and alternatives for the proposed anesthesia with the patient or authorized representative who has indicated his/her understanding and acceptance.     Dental advisory given  Plan Discussed with: CRNA and Anesthesiologist  Anesthesia Plan Comments:         Anesthesia Quick Evaluation

## 2020-09-09 NOTE — Care Management Important Message (Signed)
Important Message  Patient Details  Name: Kayla Mcclure MRN: [id number] Date of Birth: [birth year]   Medicare Important Message Given:  Yes     Orbie Pyo 09/09/2020, 11:35 AM

## 2020-09-11 LAB — BPAM RBC
Blood Product Expiration Date: 202207082359
Blood Product Expiration Date: 202207082359
Unit Type and Rh: 6200
Unit Type and Rh: 6200

## 2020-09-11 LAB — TYPE AND SCREEN
ABO/RH(D): A POS
Antibody Screen: POSITIVE
Unit division: 0
Unit division: 0

## 2020-09-12 DIAGNOSIS — D509 Iron deficiency anemia, unspecified: Secondary | ICD-10-CM | POA: Diagnosis not present

## 2020-09-12 DIAGNOSIS — D696 Thrombocytopenia, unspecified: Secondary | ICD-10-CM | POA: Diagnosis not present

## 2020-09-12 DIAGNOSIS — K922 Gastrointestinal hemorrhage, unspecified: Secondary | ICD-10-CM | POA: Diagnosis not present

## 2020-09-12 DIAGNOSIS — D500 Iron deficiency anemia secondary to blood loss (chronic): Secondary | ICD-10-CM | POA: Diagnosis not present

## 2020-09-16 DIAGNOSIS — D62 Acute posthemorrhagic anemia: Secondary | ICD-10-CM | POA: Diagnosis not present

## 2020-09-16 DIAGNOSIS — J4530 Mild persistent asthma, uncomplicated: Secondary | ICD-10-CM | POA: Diagnosis not present

## 2020-09-16 DIAGNOSIS — J9601 Acute respiratory failure with hypoxia: Secondary | ICD-10-CM | POA: Diagnosis not present

## 2020-09-16 DIAGNOSIS — K922 Gastrointestinal hemorrhage, unspecified: Secondary | ICD-10-CM | POA: Diagnosis not present

## 2020-09-19 ENCOUNTER — Telehealth: Payer: Self-pay | Admitting: Cardiology

## 2020-09-19 DIAGNOSIS — I129 Hypertensive chronic kidney disease with stage 1 through stage 4 chronic kidney disease, or unspecified chronic kidney disease: Secondary | ICD-10-CM | POA: Diagnosis not present

## 2020-09-19 DIAGNOSIS — J45909 Unspecified asthma, uncomplicated: Secondary | ICD-10-CM | POA: Diagnosis not present

## 2020-09-19 DIAGNOSIS — J449 Chronic obstructive pulmonary disease, unspecified: Secondary | ICD-10-CM | POA: Diagnosis not present

## 2020-09-19 DIAGNOSIS — I10 Essential (primary) hypertension: Secondary | ICD-10-CM | POA: Diagnosis not present

## 2020-09-19 DIAGNOSIS — J4530 Mild persistent asthma, uncomplicated: Secondary | ICD-10-CM | POA: Diagnosis not present

## 2020-09-19 DIAGNOSIS — K219 Gastro-esophageal reflux disease without esophagitis: Secondary | ICD-10-CM | POA: Diagnosis not present

## 2020-09-19 DIAGNOSIS — E785 Hyperlipidemia, unspecified: Secondary | ICD-10-CM | POA: Diagnosis not present

## 2020-09-19 MED ORDER — FUROSEMIDE 20 MG PO TABS: ORAL_TABLET | ORAL | 3 refills | Status: DC

## 2020-09-19 NOTE — Telephone Encounter (Signed)
*  STAT* If patient is at the pharmacy, call can be transferred to refill team.   1. Which medications need to be refilled? (please list name of each medication and dose if known) *Furosemide  2. Which pharmacy/location (including street and city if local pharmacy) is medication to be sent to? [place], [place]  3. Do they need a 30 day or 90 day supply? 90 days and refills

## 2020-09-28 DIAGNOSIS — D500 Iron deficiency anemia secondary to blood loss (chronic): Secondary | ICD-10-CM | POA: Diagnosis not present

## 2020-10-28 DIAGNOSIS — D649 Anemia, unspecified: Secondary | ICD-10-CM | POA: Diagnosis not present

## 2020-10-30 DIAGNOSIS — U071 COVID-19: Secondary | ICD-10-CM | POA: Diagnosis not present

## 2020-11-01 DIAGNOSIS — N189 Chronic kidney disease, unspecified: Secondary | ICD-10-CM | POA: Diagnosis not present

## 2020-11-01 DIAGNOSIS — U071 COVID-19: Secondary | ICD-10-CM | POA: Diagnosis not present

## 2020-12-30 DIAGNOSIS — J449 Chronic obstructive pulmonary disease, unspecified: Secondary | ICD-10-CM | POA: Diagnosis not present

## 2020-12-30 DIAGNOSIS — I5032 Chronic diastolic (congestive) heart failure: Secondary | ICD-10-CM | POA: Diagnosis not present

## 2020-12-30 DIAGNOSIS — M810 Age-related osteoporosis without current pathological fracture: Secondary | ICD-10-CM | POA: Diagnosis not present

## 2020-12-30 DIAGNOSIS — J4530 Mild persistent asthma, uncomplicated: Secondary | ICD-10-CM | POA: Diagnosis not present

## 2020-12-30 DIAGNOSIS — I2510 Atherosclerotic heart disease of native coronary artery without angina pectoris: Secondary | ICD-10-CM | POA: Diagnosis not present

## 2020-12-30 DIAGNOSIS — E785 Hyperlipidemia, unspecified: Secondary | ICD-10-CM | POA: Diagnosis not present

## 2020-12-30 DIAGNOSIS — I10 Essential (primary) hypertension: Secondary | ICD-10-CM | POA: Diagnosis not present

## 2020-12-30 DIAGNOSIS — N2581 Secondary hyperparathyroidism of renal origin: Secondary | ICD-10-CM | POA: Diagnosis not present

## 2020-12-30 DIAGNOSIS — J45909 Unspecified asthma, uncomplicated: Secondary | ICD-10-CM | POA: Diagnosis not present

## 2020-12-30 DIAGNOSIS — M8580 Other specified disorders of bone density and structure, unspecified site: Secondary | ICD-10-CM | POA: Diagnosis not present

## 2020-12-30 DIAGNOSIS — N1831 Chronic kidney disease, stage 3a: Secondary | ICD-10-CM | POA: Diagnosis not present

## 2020-12-30 DIAGNOSIS — I129 Hypertensive chronic kidney disease with stage 1 through stage 4 chronic kidney disease, or unspecified chronic kidney disease: Secondary | ICD-10-CM | POA: Diagnosis not present

## 2021-01-05 DIAGNOSIS — J449 Chronic obstructive pulmonary disease, unspecified: Secondary | ICD-10-CM | POA: Diagnosis not present

## 2021-01-05 DIAGNOSIS — E785 Hyperlipidemia, unspecified: Secondary | ICD-10-CM | POA: Diagnosis not present

## 2021-01-05 DIAGNOSIS — M8580 Other specified disorders of bone density and structure, unspecified site: Secondary | ICD-10-CM | POA: Diagnosis not present

## 2021-01-05 DIAGNOSIS — N1831 Chronic kidney disease, stage 3a: Secondary | ICD-10-CM | POA: Diagnosis not present

## 2021-01-05 DIAGNOSIS — N189 Chronic kidney disease, unspecified: Secondary | ICD-10-CM | POA: Diagnosis not present

## 2021-01-05 DIAGNOSIS — K219 Gastro-esophageal reflux disease without esophagitis: Secondary | ICD-10-CM | POA: Diagnosis not present

## 2021-01-05 DIAGNOSIS — I10 Essential (primary) hypertension: Secondary | ICD-10-CM | POA: Diagnosis not present

## 2021-01-05 DIAGNOSIS — J45909 Unspecified asthma, uncomplicated: Secondary | ICD-10-CM | POA: Diagnosis not present

## 2021-01-05 DIAGNOSIS — I129 Hypertensive chronic kidney disease with stage 1 through stage 4 chronic kidney disease, or unspecified chronic kidney disease: Secondary | ICD-10-CM | POA: Diagnosis not present

## 2021-01-05 DIAGNOSIS — N2581 Secondary hyperparathyroidism of renal origin: Secondary | ICD-10-CM | POA: Diagnosis not present

## 2021-01-05 DIAGNOSIS — J4530 Mild persistent asthma, uncomplicated: Secondary | ICD-10-CM | POA: Diagnosis not present

## 2021-01-05 DIAGNOSIS — M810 Age-related osteoporosis without current pathological fracture: Secondary | ICD-10-CM | POA: Diagnosis not present

## 2021-01-16 DIAGNOSIS — R634 Abnormal weight loss: Secondary | ICD-10-CM | POA: Diagnosis not present

## 2021-01-16 DIAGNOSIS — D500 Iron deficiency anemia secondary to blood loss (chronic): Secondary | ICD-10-CM | POA: Diagnosis not present

## 2021-01-20 ENCOUNTER — Telehealth (INDEPENDENT_AMBULATORY_CARE_PROVIDER_SITE_OTHER): Payer: Medicare Other | Admitting: Cardiology

## 2021-01-20 ENCOUNTER — Telehealth: Payer: Self-pay | Admitting: *Deleted

## 2021-01-20 ENCOUNTER — Encounter: Payer: Self-pay | Admitting: Cardiology

## 2021-01-20 VITALS — BP 163/80 | HR 74 | Ht 59.0 in | Wt 122.0 lb

## 2021-01-20 DIAGNOSIS — Z9861 Coronary angioplasty status: Secondary | ICD-10-CM

## 2021-01-20 DIAGNOSIS — Z87891 Personal history of nicotine dependence: Secondary | ICD-10-CM

## 2021-01-20 DIAGNOSIS — I10 Essential (primary) hypertension: Secondary | ICD-10-CM | POA: Diagnosis not present

## 2021-01-20 DIAGNOSIS — I200 Unstable angina: Secondary | ICD-10-CM | POA: Diagnosis not present

## 2021-01-20 DIAGNOSIS — I6523 Occlusion and stenosis of bilateral carotid arteries: Secondary | ICD-10-CM | POA: Diagnosis not present

## 2021-01-20 DIAGNOSIS — I2510 Atherosclerotic heart disease of native coronary artery without angina pectoris: Secondary | ICD-10-CM | POA: Diagnosis not present

## 2021-01-20 DIAGNOSIS — I25119 Atherosclerotic heart disease of native coronary artery with unspecified angina pectoris: Secondary | ICD-10-CM

## 2021-01-20 DIAGNOSIS — I739 Peripheral vascular disease, unspecified: Secondary | ICD-10-CM | POA: Diagnosis not present

## 2021-01-20 DIAGNOSIS — R0609 Other forms of dyspnea: Secondary | ICD-10-CM | POA: Diagnosis not present

## 2021-01-20 DIAGNOSIS — I209 Angina pectoris, unspecified: Secondary | ICD-10-CM

## 2021-01-20 MED ORDER — NITROGLYCERIN 0.4 MG SL SUBL: 0.4000 mg | SUBLINGUAL_TABLET | SUBLINGUAL | 6 refills | Status: DC | PRN

## 2021-01-20 MED ORDER — ISOSORBIDE MONONITRATE ER 60 MG PO TB24: 60.0000 mg | ORAL_TABLET | Freq: Every day | ORAL | 3 refills | Status: DC

## 2021-01-20 MED ORDER — FUROSEMIDE 20 MG PO TABS: ORAL_TABLET | ORAL | 3 refills | Status: DC

## 2021-01-20 MED ORDER — VALSARTAN 80 MG PO TABS: 80.0000 mg | ORAL_TABLET | Freq: Every day | ORAL | 3 refills | Status: DC

## 2021-01-20 NOTE — Telephone Encounter (Signed)
RN spoke to patient. Instruction were given  from today's virtual visit 01/20/21 .  AVS SUMMARY has been mailed. Rx sent  to  mail order and local pharmacy   Patient verbalized understanding

## 2021-01-20 NOTE — Patient Instructions (Addendum)
Medication Instructions:  -> Increase valsartan from 40 mg to 80 mg daily -> can take 2 of existing pills and to complete and then Valsartan 80 mg p.o. daily; dispense #90 tabs.  3 refills  We will refill your furosemide 20 mg take Monday Wednesday Friday with additional dose on other days if necessary for swelling  We will refill your isosorbide mononitrate but since you have been out of it for a couple days, will take advantage of this to reduce the dose to 60 mg 1 tablet daily as opposed to 2 tablets daily -> Change prescription to isosorbide mononitrate 60 mg tablet take 1 tablet by mouth daily.  Dispense #90 tabs, 3 refills  *If you need a refill on your cardiac medications before your next appointment, please call your pharmacy*   Lab Work: When you see your primary care provider next week.  Please make sure that she checks your chemistry panel and cholesterol panel amounts of the labs.    Testing/Procedures: None   Follow-Up: At [facility], you and your health needs are our priority.  As part of our continuing mission to provide you with exceptional heart care, we have created designated Provider Care Teams.  These Care Teams include your primary Cardiologist (physician) and Advanced Practice Providers (APPs -  Physician Assistants and Nurse Practitioners) who all work together to provide you with the care you need, when you need it.  We recommend signing up for the patient portal called "MyChart".  Sign up information is provided on this After Visit Summary.  MyChart is used to connect with patients for Virtual Visits (Telemedicine).  Patients are able to view lab/test results, encounter notes, upcoming appointments, etc.  Non-urgent messages can be sent to your provider as well.   To learn more about what you can do with MyChart, go to [email or web address].    Your next appointment:   6 month(s)  The format for your next appointment:   In Person  Provider:   [name], MD   Other Instructions Keep trying to build up your nutrition to make sure you drink at least 2 decent meals a day.

## 2021-01-20 NOTE — Progress Notes (Signed)
Virtual Visit via Telephone Note   This visit type was conducted due to national recommendations for restrictions regarding the COVID-19 Pandemic (e.g. social distancing) in an effort to limit this patient's exposure and mitigate transmission in our community.  Due to her co-morbid illnesses, this patient is at least at moderate risk for complications without adequate follow up.  This format is felt to be most appropriate for this patient at this time.  The patient did not have access to video technology/had technical difficulties with video requiring transitioning to audio format only (telephone).  All issues noted in this document were discussed and addressed.  No physical exam could be performed with this format.  Please refer to the patient's chart for her  consent to telehealth for [facility].   Patient has given verbal permission to conduct this visit via virtual appointment and to bill insurance 01/29/2021 3:26 PM     Evaluation Performed:  Follow-up visit  Date:  01/29/2021   ID:  Kayla Mcclure, DOB [birth year], MRN [id number]  Patient Location: Home Provider Location: Office/Clinic  PCP:  Lois Huxley, PA  Cardiologist:  [name], MD  Electrophysiologist:  None   Chief Complaint:   Chief Complaint  Patient presents with  . Follow-up    Doing relatively well.  Has not yet back to doing full exercise.  16 pound weight loss; Virtual visit  . Coronary Artery Disease    Angina has been well treated.     ====================================  ASSESSMENT & PLAN:    Problem List Items Addressed This Visit       Cardiology Problems   Progressive angina (HCC)   Relevant Medications   valsartan (DIOVAN) 80 MG tablet   nitroGLYCERIN (NITROSTAT) 0.4 MG SL tablet   furosemide (LASIX) 20 MG tablet   isosorbide mononitrate (IMDUR) 60 MG 24 hr tablet   CAD S/P percutaneous coronary angioplasty -- Promus DES 2.5 mm x 20 mm postdilated to 3 mm (Chronic)     History of PCI of the RCA dating back to 2014.  Patent stent with diffuse mild-moderate disease throughout.  No active angina.  Now on aspirin prophylaxis monotherapy.  No longer on Thienopyridine.  Is on combination of Imdur, Kenalog and beta-blocker antianginal benefit. High-dose statin      Relevant Medications   valsartan (DIOVAN) 80 MG tablet   nitroGLYCERIN (NITROSTAT) 0.4 MG SL tablet   furosemide (LASIX) 20 MG tablet   isosorbide mononitrate (IMDUR) 60 MG 24 hr tablet   Coronary artery disease involving native coronary artery of native heart with angina pectoris (HCC) (Chronic)    Thankfully, no further imaging (for claudication.  She has coronary artery disease, carotid artery disease and PAD.  Aggressive risk factor modification.  On statin, beta-blocker, ARB, calcium channel blocker and statin along with aspirin.  Okay to reduce Imdur down to 60 mg after increasing valsartan dose.      Relevant Medications   valsartan (DIOVAN) 80 MG tablet   nitroGLYCERIN (NITROSTAT) 0.4 MG SL tablet   furosemide (LASIX) 20 MG tablet   isosorbide mononitrate (IMDUR) 60 MG 24 hr tablet   Essential hypertension, benign (Chronic)    Blood pressure still high today.  Plan: Increase valsartan to 80 mg daily, refill furosemide. Can reduce Imdur to 60 mg daily.-Reduction from twice daily      Relevant Medications   valsartan (DIOVAN) 80 MG tablet   nitroGLYCERIN (NITROSTAT) 0.4 MG SL tablet   furosemide (LASIX) 20 MG tablet  isosorbide mononitrate (IMDUR) 60 MG 24 hr tablet   PAD (peripheral artery disease) - bilateral CIA & CFA 50-69%; Claudication (Chronic)    Followed by Dr. Alvester Chou with annual follow-up studies.  No abnormal findings on follow-ups.  No claudication.  Continue aggressive CV RF modification.  Back on aspirin now.  Tolerated well. On Imdur, amlodipine, Toprol, valsartan along with statin.      Relevant Medications   valsartan (DIOVAN) 80 MG tablet   nitroGLYCERIN  (NITROSTAT) 0.4 MG SL tablet   furosemide (LASIX) 20 MG tablet   isosorbide mononitrate (IMDUR) 60 MG 24 hr tablet   Bilateral carotid artery disease (HCC) - Primary (Chronic)    Status post bilateral CEA.  Follow-up Dopplers look good.  No evidence of restenosis.      Relevant Medications   valsartan (DIOVAN) 80 MG tablet   nitroGLYCERIN (NITROSTAT) 0.4 MG SL tablet   furosemide (LASIX) 20 MG tablet   isosorbide mononitrate (IMDUR) 60 MG 24 hr tablet     Other   DOE (dyspnea on exertion)    Nonobstructive disease in November 2020 is now on pressure control.  Plan: Increase valsartan to 80 mg daily, reduce Imdur to 60 mg daily from twice daily to avoid tachyphylaxis. Continue calcium channel blocker beta-blocker. Dyspnea seems to be improving with weight loss.      Relevant Medications   nitroGLYCERIN (NITROSTAT) 0.4 MG SL tablet   Former heavy cigarette smoker (20-39 per day) (Chronic)    Needs to fully quit smoking.      Other Visit Diagnoses     Angina, class III (HCC)       Relevant Medications   valsartan (DIOVAN) 80 MG tablet   nitroGLYCERIN (NITROSTAT) 0.4 MG SL tablet   furosemide (LASIX) 20 MG tablet   isosorbide mononitrate (IMDUR) 60 MG 24 hr tablet       ====================================  History of Present Illness:    Kayla Mcclure is a 75 y.o. female with PMH noted below who presents via elehealth visit today as an annual follow-up.  CV HISTORY CAD:non-STEMI in 2009 with DES PCI to the RCA,  Cath in February 11, 2019 => patent RCA stent w/ mild ISR.  small caliber RPAV 75%; minimum LAD, LCx Dz.   Echo 05/2019 = Normal EF 60-65%, no RWMA. ~elevated RVP. Normal Valves. PAD with lifestyle limiting claudication--> PTCA of the L Com & External Iliac A.  (Dr. Gwenlyn Found) Bilateral Carotid Artery stenosis => (Dr. Donzetta Matters). ->  No stenoses noted on follow-up Dopplers. S/p L CEA in April 2021. S/p R CEA in May 2021. Complicated by iron deficiency anemia -  recurrent GIB - INITIAL PRESENTATION WAS FOR SYNCOPE. Treated with IV Iron - last Rx 11/20/2019 H/H 11/02/2019 12.8/41.5. (up from 8.5/26.9 as of June 14) Readmitted June 2022 - Transfused & EGD - Atrophic Gastritis & Duodenal AVM (APC)  Kayla Mcclure was last seen in Oct 2021 (in person)-100 TAVR blood pressure 180/96 Feeling well her hemoglobin levels are stable from her hospitalizations earlier that year.  No further chest pain or pressure.  No further bleeding.  Taking Lasix 3 days a week with no major issues.  Edema resolved.  She was seen by CVRR pharmacy team on March 15, 2020 for blood pressure follow-up.  Valsartan initiated.  Hospitalizations:  6/8-12/2020: Hospitalized due to recurrent GI bleeds (history of jejunal AVMs in 2017) -> developed similar symptoms with exertional dyspnea, lightheadedness back pain and dark tarry stools.  Presented to urgent care-was  hypoxic.  This was third GI bleed in the calendar year.  EGD performed June 10 revealed chronic atrophic gastritis and duodenal AVM treated with APC.  Hemoglobin stable.  Aspirin was held for 3 days on discharge.  No adverse cardiac events.   Recent - Interim CV studies:   The following studies were reviewed today: Carotid Dopplers: No evidence of bilateral ICA stenosis.  R ECA>50%/  - Stable   Inerval History   Kayla Mcclure seems to be doing well.  Still not yet exercising -- plays with grandkids, yard work & caregiver for her husband (wgt 400 lb, lymphedema - wheelchair bound). Occasionally SOB when lying down @ night - uses inhaler.  No real Orthopnea or PND Sx.  Also occasionally when walking briskly- also better with inhaler.  No resting dyspnea. Frequently dizzy.  Only eats 1 meal /day b/c not ever hungry.  Trying to get back on Ensure etc. she had gotten up to 138 pounds and now down to 122 pounds.  Cardiovascular ROS: positive for - dyspnea on exertion and R> ankle swelling. Frequently feels light  headed - has to stop doing what she is doing to let it pass ~ Near syncope. When standing / moving. 2-3 x weeks or go a few weeks without.  negative for - chest pain, edema, irregular heartbeat, loss of consciousness, orthopnea, palpitations, paroxysmal nocturnal dyspnea, rapid heart rate, shortness of breath, or TIA/ amaurosis fugax, claudication   ROS:  Please see the history of present illness.     Review of Systems  Constitutional:  Positive for weight loss (really eating). Negative for malaise/fatigue (Finally noting that her energy level is going back to normal.).  HENT:  Negative for congestion and nosebleeds.   Eyes:  Negative for blurred vision and discharge.  Respiratory:  Positive for shortness of breath (with vigorous walking). Negative for cough.   Cardiovascular:  Negative for claudication and leg swelling (controlled).  Gastrointestinal:  Negative for abdominal pain, blood in stool and melena.  Genitourinary:  Negative for hematuria.  Neurological:  Positive for dizziness (per HPI). Negative for focal weakness.  Psychiatric/Behavioral:  Negative for depression and memory loss. The patient is not nervous/anxious and does not have insomnia.    Past Medical History:  Diagnosis Date  . Acute GI bleeding 08/07/2019  . Asthma  10/31/2007  . CAD (coronary artery disease) 08/05/2007   a) NSTEMI 2009: PCI to the RCA Promus DES 2.5 mm x 23 mm (3.0 mm). b) Myoview June 2017: Nonspecific ST changes. EF greater than 65%. LOW RISK, normal study. No ischemia or infarction.  . Carotid artery disease [facility])    s/p Bilateral CEA March & May 2020.   Marland Kitchen Chronic back pain   . Chronic diastolic heart failure ([place])   . CKD (chronic kidney disease), stage II   . Diverticulosis   04/11/2010    Colonoscopy  . Dyslipidemia, goal LDL below 70   . Essential hypertension    a. Normal renal arteries by PV angio 05/2013.  Marland Kitchen GERD (gastroesophageal reflux disease)   . GI bleed 12/2015  . History of  kidney stones 1975  . History of recent blood transfusion 12/2015   12-19-15, 12-20-15 and 12-21-15  . Insomnia   . Internal hemorrhoids  04/11/2010   By colonoscopy  . Iron deficiency anemia   . Myocardial infarction ([place]) 2009  . Peripheral arterial disease ([place])    a. s/p PTA and stenting of L CIA stenosis 05/2013.  Marland Kitchen Renal  insufficiency   . Stroke [facility])    mini stroke  . Tobacco abuse    Quit in 05/2013    Past Surgical History:  Procedure Laterality Date  . CAPSULE ENDOSCOPY  10/27/2019   [facility]) evidence of "pinhole leaking" in both stomach and proximal small bowel; no overt bleeding noted  . CORONARY ANGIOPLASTY WITH STENT PLACEMENT  08/05/2007   Promus DES 2.5 mm x 23 mm (3 mm)  to RCA; EF 60-70%  . ENDARTERECTOMY Left 07/28/2019   Procedure: ENDARTERECTOMY CAROTID;  Surgeon: Waynetta Sandy, MD;  Location: [place];  Service: Vascular;  Laterality: Left;  . ENDARTERECTOMY Right 08/27/2019   Procedure: ENDARTERECTOMY CAROTID RIGHT WITH PATCH ANGIOPLASTY;  Surgeon: Waynetta Sandy, MD;  Location: [place];  Service: Vascular;  Laterality: Right;  . ENTEROSCOPY N/A 12/27/2015   Procedure: ENTEROSCOPY;  Surgeon: Clarene Essex, MD;  Location: [facility];  Service: Endoscopy;  Laterality: N/A;  also needs slim egd scope  . ENTEROSCOPY N/A 05/15/2019   Procedure: ENTEROSCOPY;  Surgeon: Ronald Lobo, MD;  Location: [place];  Service: Endoscopy;  Laterality: N/A;  Push enteroscopy using either ultraslim colonoscope or push enteroscope  . ENTEROSCOPY N/A 08/08/2019   Procedure: ENTEROSCOPY;  Surgeon: Ronnette Juniper, MD;  Location: [place];  Service: Gastroenterology;  Laterality: N/A;  . ESOPHAGOGASTRODUODENOSCOPY (EGD) WITH PROPOFOL N/A 09/09/2020   Procedure: ESOPHAGOGASTRODUODENOSCOPY (EGD) WITH PROPOFOL;  Surgeon: Clarene Essex, MD;  Location: [place];  Service: Endoscopy;  Laterality: N/A;  . HOT HEMOSTASIS N/A 08/08/2019   Procedure: HOT HEMOSTASIS (ARGON PLASMA  COAGULATION/BICAP);  Surgeon: Ronnette Juniper, MD;  Location: [place];  Service: Gastroenterology;  Laterality: N/A;  . HOT HEMOSTASIS N/A 09/09/2020   Procedure: HOT HEMOSTASIS (ARGON PLASMA COAGULATION/BICAP);  Surgeon: Clarene Essex, MD;  Location: [place];  Service: Endoscopy;  Laterality: N/A;  . ILIAC ARTERY STENT Left 05/04/2013   L CIA-EIA -- Dr. Gwenlyn Found  . LEFT HEART CATH AND CORONARY ANGIOGRAPHY N/A 02/11/2019   Procedure: LEFT HEART CATH AND CORONARY ANGIOGRAPHY;  Surgeon: Leonie Man, MD;  Location: [place] CV LAB;;-  Patent RCA stent with minimal ISR after pRCA 50%).  Small caliber RPAV with ~75%.  pLAD 30%, m-dLAD 40%, D2 40%.  Normal LVEDP and LVEF.  . LOWER EXTREMITY ANGIOGRAM Bilateral 05/04/2013   Procedure: LOWER EXTREMITY ANGIOGRAM;  Surgeon: Lorretta Harp, MD;  Location: [facility] CATH LAB;  Service: Cardiovascular;  Laterality: Bilateral;  . NM MYOVIEW LTD  09/07/2015   EF > 65%. Nonspecific ST changes but no ischemic changes. No ischemia or infarction. LOW RISK  . PATCH ANGIOPLASTY Left 07/28/2019   Procedure: PATCH ANGIOPLASTY USING Rueben Bash BIOLOGIC PATCH;  Surgeon: Waynetta Sandy, MD;  Location: [place];  Service: Vascular;  Laterality: Left;  . TONSILLECTOMY  1960's  . TRANSTHORACIC ECHOCARDIOGRAM  05/14/2019   Normal EF 60 to 65%.  No R WMA.   Normal RV but mildly elevated pressures.  Relatively normal aortic and mitral valves.  Marland Kitchen UPPER GI ENDOSCOPY  12/14/2015     Current Meds  Medication Sig  . albuterol (VENTOLIN HFA) 108 (90 Base) MCG/ACT inhaler Inhale 2 puffs into the lungs every 6 (six) hours as needed for wheezing or shortness of breath.  Marland Kitchen alendronate (FOSAMAX) 70 MG tablet Take 70 mg by mouth once a week.  Marland Kitchen amLODipine (NORVASC) 5 MG tablet Take 1 tablet (5 mg total) by mouth daily.  Marland Kitchen aspirin EC 81 MG tablet Take 1 tablet (81 mg total) by mouth daily.  Marland Kitchen  atorvastatin (LIPITOR) 80 MG tablet Take 1 tablet (80 mg total) by mouth daily.  .  Cholecalciferol (VITAMIN D3) 1.25 MG (50000 UT) CAPS Take 1 capsule by mouth once a week.  . ezetimibe (ZETIA) 10 MG tablet Take 1 tablet (10 mg total) by mouth daily.  . ferrous gluconate (FERGON) 324 MG tablet Take 324 mg by mouth daily with breakfast.  . furosemide (LASIX) 20 MG tablet Take 20 mg  One tablet on Monday , Wednesday , Fridays , may take an additional  20 mg ff needed-> has been out for couple days  . isosorbide mononitrate (IMDUR) 60 MG 24 hr tablet Take 120 mg ( 2 tablets of 60 mg ) by mouth daily -> has been out of it for couple days  . metoprolol succinate (TOPROL-XL) 100 MG 24 hr tablet Take 2 tablets (200 mg total) by mouth at bedtime.  . nitroGLYCERIN (NITROSTAT) 0.4 MG SL tablet Place 1 tablet (0.4 mg total) under the tongue every 5 (five) minutes as needed for chest pain.  Marland Kitchen ondansetron (ZOFRAN) 4 MG tablet Take 1 tablet (4 mg total) by mouth every 6 (six) hours as needed for nausea.  . pantoprazole (PROTONIX) 40 MG tablet Take 1 tablet (40 mg total) by mouth daily.  . valsartan (DIOVAN) 40 MG tablet Take 1 tablet by mouth once daily     Allergies:   Patient has no known allergies.   Social History   Tobacco Use  . Smoking status: Light Smoker    Types: Cigarettes    Last attempt to quit: 05/04/2013    Years since quitting: 7.7  . Smokeless tobacco: Never  . Tobacco comments:    She initially quit, but has gone back to smoking a couple cigarettes every now and then.  Vaping Use  . Vaping Use: Never used  Substance Use Topics  . Alcohol use: Yes    Comment: occasionally 3 times a year  . Drug use: No     Family Hx: The patient's family history includes Breast cancer in her sister; Breast cancer (age of onset: 81) in her sister; Breast cancer (age of onset: 86) in her mother; Colon cancer (age of onset: 64) in her mother; Diabetes in her mother and sister; Heart attack (age of onset: 16) in her father; Heart attack (age of onset: 61) in her mother; Heart disease  in her mother; Pneumonia in her father.   Labs/Other Tests and Data Reviewed:    EKG:  No ECG reviewed.  Recent Labs: 09/07/2020: B Natriuretic Peptide 136.4 09/08/2020: ALT 12 09/09/2020: BUN 20; Creatinine, Ser 1.14; Hemoglobin 11.0; Platelets 134; Potassium 3.8; Sodium 137  Lab Results  Component Value Date   WBC 6.0 09/09/2020   HGB 11.0 (L) 09/09/2020   HCT 33.9 (L) 09/09/2020   MCV 90.9 09/09/2020   PLT 134 (L) 09/09/2020    Recent Lipid Panel Lab Results  Component Value Date   CHOL 91 05/15/2019   HDL 45 05/15/2019   LDLCALC 35 05/15/2019   TRIG 55 05/15/2019   CHOLHDL 2.0 05/15/2019     Wt Readings from Last 3 Encounters:  01/20/21 122 lb (55.3 kg)  09/09/20 138 lb 0.1 oz (62.6 kg)  06/20/20 130 lb (59 kg)     Objective:    Vital Signs:  BP (!) 163/80   Pulse 74   Ht 4\' 11"  (1.499 m)   Wt 122 lb (55.3 kg) Comment: lost weight  BMI 24.64 kg/m  - BP usually better.  130s-150s SBP - missed a few doses of meds.  VITAL SIGNS:  reviewed GEN:  no acute distress RESPIRATORY:  non-labored PSYCH:  normal affect   ==========================================  COVID-19 Education: The signs and symptoms of COVID-19 were discussed with the patient and how to seek care for testing (follow up with PCP or arrange E-visit).   The importance of social distancing was discussed today.  Time:   Today, I have spent 22 minutes with the patient with telehealth technology discussing the above problems.   An additional 18 minutes spent charting (reviewing prior notes, hospital records, studies, labs etc.) Total 40 minutes   Medication Adjustments/Labs and Tests Ordered: Current medicines are reviewed at length with the patient today.  Concerns regarding medicines are outlined above.   Patient Instructions  Medication Instructions:  -> Increase valsartan from 40 mg to 80 mg daily -> can take 2 of existing pills and to complete and then Valsartan 80 mg p.o. daily; dispense  #90 tabs.  3 refills  We will refill your furosemide 20 mg take Monday Wednesday Friday with additional dose on other days if necessary for swelling  We will refill your isosorbide mononitrate but since you have been out of it for a couple days, will take advantage of this to reduce the dose to 60 mg 1 tablet daily as opposed to 2 tablets daily -> Change prescription to isosorbide mononitrate 60 mg tablet take 1 tablet by mouth daily.  Dispense #90 tabs, 3 refills  *If you need a refill on your cardiac medications before your next appointment, please call your pharmacy*   Lab Work: When you see your primary care provider next week.  Please make sure that she checks your chemistry panel and cholesterol panel amounts of the labs.    Testing/Procedures: None   Follow-Up: At [facility], you and your health needs are our priority.  As part of our continuing mission to provide you with exceptional heart care, we have created designated Provider Care Teams.  These Care Teams include your primary Cardiologist (physician) and Advanced Practice Providers (APPs -  Physician Assistants and Nurse Practitioners) who all work together to provide you with the care you need, when you need it.  We recommend signing up for the patient portal called "MyChart".  Sign up information is provided on this After Visit Summary.  MyChart is used to connect with patients for Virtual Visits (Telemedicine).  Patients are able to view lab/test results, encounter notes, upcoming appointments, etc.  Non-urgent messages can be sent to your provider as well.   To learn more about what you can do with MyChart, go to [email or web address].    Your next appointment:   6 month(s)  The format for your next appointment:   In Person  Provider:   [name], MD   Other Instructions Keep trying to build up your nutrition to make sure you drink at least 2 decent meals a day.     Signed, [name], MD   01/29/2021 3:26 PM    [place]

## 2021-01-20 NOTE — Telephone Encounter (Signed)
  Patient Consent for Virtual Visit        Kayla Mcclure has provided verbal consent on 01/20/2021 for a virtual visit (video or telephone).   CONSENT FOR VIRTUAL VISIT FOR:  Kayla Mcclure  By participating in this virtual visit I agree to the following:  I hereby voluntarily request, consent and authorize [facility] and its employed or contracted physicians, physician assistants, nurse practitioners or other licensed health care professionals (the Practitioner), to provide me with telemedicine health care services (the "Services") as deemed necessary by the treating Practitioner. I acknowledge and consent to receive the Services by the Practitioner via telemedicine. I understand that the telemedicine visit will involve communicating with the Practitioner through live audiovisual communication technology and the disclosure of certain medical information by electronic transmission. I acknowledge that I have been given the opportunity to request an in-person assessment or other available alternative prior to the telemedicine visit and am voluntarily participating in the telemedicine visit.  I understand that I have the right to withhold or withdraw my consent to the use of telemedicine in the course of my care at any time, without affecting my right to future care or treatment, and that the Practitioner or I may terminate the telemedicine visit at any time. I understand that I have the right to inspect all information obtained and/or recorded in the course of the telemedicine visit and may receive copies of available information for a reasonable fee.  I understand that some of the potential risks of receiving the Services via telemedicine include:  Delay or interruption in medical evaluation due to technological equipment failure or disruption; Information transmitted may not be sufficient (e.g. poor resolution of images) to allow for appropriate medical decision making by the  Practitioner; and/or  In rare instances, security protocols could fail, causing a breach of personal health information.  Furthermore, I acknowledge that it is my responsibility to provide information about my medical history, conditions and care that is complete and accurate to the best of my ability. I acknowledge that Practitioner's advice, recommendations, and/or decision may be based on factors not within their control, such as incomplete or inaccurate data provided by me or distortions of diagnostic images or specimens that may result from electronic transmissions. I understand that the practice of medicine is not an exact science and that Practitioner makes no warranties or guarantees regarding treatment outcomes. I acknowledge that a copy of this consent can be made available to me via my patient portal ([place]), or I can request a printed copy by calling the office of [facility].    I understand that my insurance will be billed for this visit.   I have read or had this consent read to me. I understand the contents of this consent, which adequately explains the benefits and risks of the Services being provided via telemedicine.  I have been provided ample opportunity to ask questions regarding this consent and the Services and have had my questions answered to my satisfaction. I give my informed consent for the services to be provided through the use of telemedicine in my medical care

## 2021-01-25 DIAGNOSIS — Z0000 Encounter for general adult medical examination without abnormal findings: Secondary | ICD-10-CM | POA: Diagnosis not present

## 2021-01-25 DIAGNOSIS — I739 Peripheral vascular disease, unspecified: Secondary | ICD-10-CM | POA: Diagnosis not present

## 2021-01-25 DIAGNOSIS — Z23 Encounter for immunization: Secondary | ICD-10-CM | POA: Diagnosis not present

## 2021-01-25 DIAGNOSIS — Z1389 Encounter for screening for other disorder: Secondary | ICD-10-CM | POA: Diagnosis not present

## 2021-01-25 DIAGNOSIS — K219 Gastro-esophageal reflux disease without esophagitis: Secondary | ICD-10-CM | POA: Diagnosis not present

## 2021-01-25 DIAGNOSIS — E7800 Pure hypercholesterolemia, unspecified: Secondary | ICD-10-CM | POA: Diagnosis not present

## 2021-01-25 DIAGNOSIS — I779 Disorder of arteries and arterioles, unspecified: Secondary | ICD-10-CM | POA: Diagnosis not present

## 2021-01-25 DIAGNOSIS — N2581 Secondary hyperparathyroidism of renal origin: Secondary | ICD-10-CM | POA: Diagnosis not present

## 2021-01-25 DIAGNOSIS — I5032 Chronic diastolic (congestive) heart failure: Secondary | ICD-10-CM | POA: Diagnosis not present

## 2021-01-25 DIAGNOSIS — I10 Essential (primary) hypertension: Secondary | ICD-10-CM | POA: Diagnosis not present

## 2021-01-25 DIAGNOSIS — E559 Vitamin D deficiency, unspecified: Secondary | ICD-10-CM | POA: Diagnosis not present

## 2021-01-25 DIAGNOSIS — J449 Chronic obstructive pulmonary disease, unspecified: Secondary | ICD-10-CM | POA: Diagnosis not present

## 2021-01-25 DIAGNOSIS — I25119 Atherosclerotic heart disease of native coronary artery with unspecified angina pectoris: Secondary | ICD-10-CM | POA: Diagnosis not present

## 2021-01-29 ENCOUNTER — Encounter: Payer: Self-pay | Admitting: Cardiology

## 2021-01-29 NOTE — Assessment & Plan Note (Signed)
Thankfully, no further imaging (for claudication.  She has coronary artery disease, carotid artery disease and PAD.  Aggressive risk factor modification.  On statin, beta-blocker, ARB, calcium channel blocker and statin along with aspirin.  Okay to reduce Imdur down to 60 mg after increasing valsartan dose.

## 2021-01-29 NOTE — Assessment & Plan Note (Signed)
Nonobstructive disease in November 2020 is now on pressure control.  Plan: Increase valsartan to 80 mg daily, reduce Imdur to 60 mg daily from twice daily to avoid tachyphylaxis. Continue calcium channel blocker beta-blocker. Dyspnea seems to be improving with weight loss.

## 2021-01-29 NOTE — Assessment & Plan Note (Signed)
History of PCI of the RCA dating back to 2014.  Patent stent with diffuse mild-moderate disease throughout.  No active angina.  Now on aspirin prophylaxis monotherapy.  No longer on Thienopyridine.  Is on combination of Imdur, Kenalog and beta-blocker antianginal benefit. High-dose statin

## 2021-01-29 NOTE — Assessment & Plan Note (Signed)
Needs to fully quit smoking.

## 2021-01-29 NOTE — Assessment & Plan Note (Signed)
Blood pressure still high today.  Plan: Increase valsartan to 80 mg daily, refill furosemide. Can reduce Imdur to 60 mg daily.-Reduction from twice daily

## 2021-01-29 NOTE — Assessment & Plan Note (Signed)
Followed by Dr. Alvester Chou with annual follow-up studies.  No abnormal findings on follow-ups.  No claudication.  Continue aggressive CV RF modification.  Back on aspirin now.  Tolerated well. On Imdur, amlodipine, Toprol, valsartan along with statin.

## 2021-01-29 NOTE — Assessment & Plan Note (Signed)
Status post bilateral CEA.  Follow-up Dopplers look good.  No evidence of restenosis.

## 2021-02-01 ENCOUNTER — Other Ambulatory Visit: Payer: Self-pay | Admitting: Family Medicine

## 2021-02-01 DIAGNOSIS — E2839 Other primary ovarian failure: Secondary | ICD-10-CM

## 2021-02-01 DIAGNOSIS — Z1231 Encounter for screening mammogram for malignant neoplasm of breast: Secondary | ICD-10-CM

## 2021-02-03 DIAGNOSIS — R7303 Prediabetes: Secondary | ICD-10-CM | POA: Diagnosis not present

## 2021-02-03 DIAGNOSIS — I129 Hypertensive chronic kidney disease with stage 1 through stage 4 chronic kidney disease, or unspecified chronic kidney disease: Secondary | ICD-10-CM | POA: Diagnosis not present

## 2021-02-03 DIAGNOSIS — N183 Chronic kidney disease, stage 3 unspecified: Secondary | ICD-10-CM | POA: Diagnosis not present

## 2021-02-03 DIAGNOSIS — I2510 Atherosclerotic heart disease of native coronary artery without angina pectoris: Secondary | ICD-10-CM | POA: Diagnosis not present

## 2021-02-03 DIAGNOSIS — E785 Hyperlipidemia, unspecified: Secondary | ICD-10-CM | POA: Diagnosis not present

## 2021-02-03 DIAGNOSIS — K922 Gastrointestinal hemorrhage, unspecified: Secondary | ICD-10-CM | POA: Diagnosis not present

## 2021-02-03 DIAGNOSIS — J449 Chronic obstructive pulmonary disease, unspecified: Secondary | ICD-10-CM | POA: Diagnosis not present

## 2021-02-03 DIAGNOSIS — N2581 Secondary hyperparathyroidism of renal origin: Secondary | ICD-10-CM | POA: Diagnosis not present

## 2021-02-03 DIAGNOSIS — N189 Chronic kidney disease, unspecified: Secondary | ICD-10-CM | POA: Diagnosis not present

## 2021-02-03 DIAGNOSIS — Z720 Tobacco use: Secondary | ICD-10-CM | POA: Diagnosis not present

## 2021-02-03 DIAGNOSIS — I739 Peripheral vascular disease, unspecified: Secondary | ICD-10-CM | POA: Diagnosis not present

## 2021-02-08 DIAGNOSIS — N183 Chronic kidney disease, stage 3 unspecified: Secondary | ICD-10-CM | POA: Diagnosis not present

## 2021-03-08 DIAGNOSIS — N2581 Secondary hyperparathyroidism of renal origin: Secondary | ICD-10-CM | POA: Diagnosis not present

## 2021-03-08 DIAGNOSIS — J449 Chronic obstructive pulmonary disease, unspecified: Secondary | ICD-10-CM | POA: Diagnosis not present

## 2021-03-08 DIAGNOSIS — R7303 Prediabetes: Secondary | ICD-10-CM | POA: Diagnosis not present

## 2021-03-08 DIAGNOSIS — N183 Chronic kidney disease, stage 3 unspecified: Secondary | ICD-10-CM | POA: Diagnosis not present

## 2021-03-08 DIAGNOSIS — N189 Chronic kidney disease, unspecified: Secondary | ICD-10-CM | POA: Diagnosis not present

## 2021-03-08 DIAGNOSIS — K922 Gastrointestinal hemorrhage, unspecified: Secondary | ICD-10-CM | POA: Diagnosis not present

## 2021-03-08 DIAGNOSIS — E785 Hyperlipidemia, unspecified: Secondary | ICD-10-CM | POA: Diagnosis not present

## 2021-03-08 DIAGNOSIS — Z720 Tobacco use: Secondary | ICD-10-CM | POA: Diagnosis not present

## 2021-03-08 DIAGNOSIS — I129 Hypertensive chronic kidney disease with stage 1 through stage 4 chronic kidney disease, or unspecified chronic kidney disease: Secondary | ICD-10-CM | POA: Diagnosis not present

## 2021-03-08 DIAGNOSIS — I739 Peripheral vascular disease, unspecified: Secondary | ICD-10-CM | POA: Diagnosis not present

## 2021-03-08 DIAGNOSIS — I2510 Atherosclerotic heart disease of native coronary artery without angina pectoris: Secondary | ICD-10-CM | POA: Diagnosis not present

## 2021-03-15 ENCOUNTER — Ambulatory Visit
Admission: RE | Admit: 2021-03-15 | Discharge: 2021-03-15 | Disposition: A | Discharge status: Home / Self Care | Payer: Medicare Other | Source: Ambulatory Visit | Attending: Family Medicine | Admitting: Family Medicine

## 2021-03-15 DIAGNOSIS — Z1231 Encounter for screening mammogram for malignant neoplasm of breast: Secondary | ICD-10-CM | POA: Diagnosis not present

## 2021-03-16 DIAGNOSIS — J449 Chronic obstructive pulmonary disease, unspecified: Secondary | ICD-10-CM | POA: Diagnosis not present

## 2021-03-16 DIAGNOSIS — J4530 Mild persistent asthma, uncomplicated: Secondary | ICD-10-CM | POA: Diagnosis not present

## 2021-03-16 DIAGNOSIS — I5032 Chronic diastolic (congestive) heart failure: Secondary | ICD-10-CM | POA: Diagnosis not present

## 2021-03-16 DIAGNOSIS — J45909 Unspecified asthma, uncomplicated: Secondary | ICD-10-CM | POA: Diagnosis not present

## 2021-03-16 DIAGNOSIS — K219 Gastro-esophageal reflux disease without esophagitis: Secondary | ICD-10-CM | POA: Diagnosis not present

## 2021-03-16 DIAGNOSIS — M810 Age-related osteoporosis without current pathological fracture: Secondary | ICD-10-CM | POA: Diagnosis not present

## 2021-03-16 DIAGNOSIS — E785 Hyperlipidemia, unspecified: Secondary | ICD-10-CM | POA: Diagnosis not present

## 2021-03-16 DIAGNOSIS — I129 Hypertensive chronic kidney disease with stage 1 through stage 4 chronic kidney disease, or unspecified chronic kidney disease: Secondary | ICD-10-CM | POA: Diagnosis not present

## 2021-03-16 DIAGNOSIS — M8580 Other specified disorders of bone density and structure, unspecified site: Secondary | ICD-10-CM | POA: Diagnosis not present

## 2021-03-16 DIAGNOSIS — N1831 Chronic kidney disease, stage 3a: Secondary | ICD-10-CM | POA: Diagnosis not present

## 2021-03-16 DIAGNOSIS — I10 Essential (primary) hypertension: Secondary | ICD-10-CM | POA: Diagnosis not present

## 2021-03-16 DIAGNOSIS — N189 Chronic kidney disease, unspecified: Secondary | ICD-10-CM | POA: Diagnosis not present

## 2021-03-21 ENCOUNTER — Other Ambulatory Visit: Payer: Self-pay

## 2021-03-21 DIAGNOSIS — Z87891 Personal history of nicotine dependence: Secondary | ICD-10-CM

## 2021-03-21 DIAGNOSIS — F17210 Nicotine dependence, cigarettes, uncomplicated: Secondary | ICD-10-CM

## 2021-03-21 MED ORDER — AMLODIPINE BESYLATE 5 MG PO TABS: 5.0000 mg | ORAL_TABLET | Freq: Every day | ORAL | 3 refills | Status: DC

## 2021-04-12 ENCOUNTER — Telehealth: Payer: Self-pay | Admitting: Acute Care

## 2021-04-12 ENCOUNTER — Ambulatory Visit (INDEPENDENT_AMBULATORY_CARE_PROVIDER_SITE_OTHER)
Admission: RE | Admit: 2021-04-12 | Discharge: 2021-04-12 | Disposition: A | Discharge status: Home / Self Care | Payer: Medicare Other | Source: Ambulatory Visit | Attending: Acute Care | Admitting: Acute Care

## 2021-04-12 ENCOUNTER — Other Ambulatory Visit: Payer: Self-pay

## 2021-04-12 DIAGNOSIS — Z87891 Personal history of nicotine dependence: Secondary | ICD-10-CM

## 2021-04-12 DIAGNOSIS — F17210 Nicotine dependence, cigarettes, uncomplicated: Secondary | ICD-10-CM | POA: Diagnosis not present

## 2021-04-12 NOTE — Telephone Encounter (Signed)
[facility] and spoke with Verdis Frederickson. Saw that pt's report was not finalized. Verdis Frederickson said that she called office too soon and that was why she hung up the phone.  Verdis Frederickson said she would call us back once report was finalized.

## 2021-04-13 NOTE — Telephone Encounter (Signed)
Call report from [facility]

## 2021-04-13 NOTE — Telephone Encounter (Signed)
This is an FYI for a call report.

## 2021-04-14 ENCOUNTER — Telehealth: Payer: Self-pay | Admitting: Acute Care

## 2021-04-14 NOTE — Telephone Encounter (Signed)
I attempted to call the patient with the results of her low-dose screening CT.  Her scan was resulted as a lung RADS 4B x3 nodules.  I attempted to call the patient today to ask her if she has been sick as I wanted to rule out that this could possibly be an infectious or inflammatory etiology.  I did reach a family member however the patient was not home.  Plan will be to call the patient on Monday to see if she has been sick to better determine if this could potentially be an inflammatory/infectious etiology. I have also messaged Dr. Valeta Harms to take a look at the scan to see if he also feels this could potentially be infectious.  Options at that point would be to treat the patient for the infection, and repeat low-dose CT in 1 month.  I will call the patient on Monday, 04/17/2021 to assess whether or not she has been sick.

## 2021-04-17 NOTE — Telephone Encounter (Signed)
See telephone note 04/14/2021

## 2021-04-18 ENCOUNTER — Telehealth: Payer: Self-pay | Admitting: Acute Care

## 2021-04-18 DIAGNOSIS — F17210 Nicotine dependence, cigarettes, uncomplicated: Secondary | ICD-10-CM

## 2021-04-18 DIAGNOSIS — Z87891 Personal history of nicotine dependence: Secondary | ICD-10-CM

## 2021-04-18 DIAGNOSIS — R911 Solitary pulmonary nodule: Secondary | ICD-10-CM

## 2021-04-18 NOTE — Telephone Encounter (Signed)
I have called the patient with the results of her low dose  CT Chest. I explained that her scan was read as a Lung RADS 4 B indicates suspicious findings for which additional diagnostic testing and or tissue sampling is recommended. Dr. Valeta Harms has looked at the scan and feels this is more reflective of infection inflammation. I explained that the plan is to repeat the scan in 3 months and re-evaluate these nodules at that time. She is in agreement with this plan.  Kayla Mcclure, 3 month follow up Low Dose Ct . Please fax results to PCP . Thanks so much

## 2021-04-19 NOTE — Telephone Encounter (Signed)
CT results faxed to PCP. Order placed for 3 mth nodule f/u CT.  °

## 2021-04-19 NOTE — Addendum Note (Signed)
Addended by: Doroteo Glassman D on: 04/19/2021 10:11 AM   Modules accepted: Orders

## 2021-04-20 ENCOUNTER — Telehealth: Payer: Self-pay | Admitting: Acute Care

## 2021-04-20 NOTE — Telephone Encounter (Signed)
Please call patient and see if she can do an OV or video visit 1/23 with me in the 9 or 10 am slot. She needs treat\ment with antibiotics before her repeat scan on 8 weeks. Thanks so much

## 2021-04-20 NOTE — Telephone Encounter (Signed)
Appointment scheduled for 04/24/21 at 0930 am per Eric Form, NP

## 2021-04-20 NOTE — Telephone Encounter (Signed)
Attempted to reach patient to schedule OV.  Left message.  Husband says we can try her again later this afternoon also.

## 2021-04-21 ENCOUNTER — Other Ambulatory Visit: Payer: Self-pay

## 2021-04-21 ENCOUNTER — Encounter: Payer: Medicare Other | Admitting: *Deleted

## 2021-04-21 DIAGNOSIS — F17210 Nicotine dependence, cigarettes, uncomplicated: Secondary | ICD-10-CM

## 2021-04-21 DIAGNOSIS — Z006 Encounter for examination for normal comparison and control in clinical research program: Secondary | ICD-10-CM

## 2021-04-24 ENCOUNTER — Encounter: Payer: Self-pay | Admitting: Acute Care

## 2021-04-24 ENCOUNTER — Other Ambulatory Visit: Payer: Self-pay

## 2021-04-24 ENCOUNTER — Telehealth (INDEPENDENT_AMBULATORY_CARE_PROVIDER_SITE_OTHER): Payer: Medicare Other | Admitting: Acute Care

## 2021-04-24 DIAGNOSIS — R918 Other nonspecific abnormal finding of lung field: Secondary | ICD-10-CM | POA: Diagnosis not present

## 2021-04-24 DIAGNOSIS — F17210 Nicotine dependence, cigarettes, uncomplicated: Secondary | ICD-10-CM | POA: Diagnosis not present

## 2021-04-24 DIAGNOSIS — J069 Acute upper respiratory infection, unspecified: Secondary | ICD-10-CM

## 2021-04-24 MED ORDER — DOXYCYCLINE HYCLATE 100 MG PO TABS: 100.0000 mg | ORAL_TABLET | Freq: Two times a day (BID) | ORAL | 0 refills | Status: DC

## 2021-04-24 NOTE — Addendum Note (Signed)
Addended by: Eric Form F on: 04/24/2021 03:24 PM   Modules accepted: Orders

## 2021-04-24 NOTE — Research (Signed)
Title: NIGHTINGALE: CliNIcal Utility of ManaGement of Patients witH CT and LDCT Identified Pulmonary Nodules UsinG the Percepta NasAL Swab ClassifiEr -- with Familiarization   Protocol #: [id number] Sponsor: [employer]   Protocol Revision 1 dated 01Sep2022 and confirmed current on today's visit, IRB approved Revision 1 on 29Dec2022.   Objectives:  Primary: To evaluate if use of the Percepta Nasal Swab test in the diagnostic work up of newly identified pulmonary nodules reduces the number of invasive procedures in the group classified as low-risk by the test and that are benign as compared to a control group managed without a Percepta Nasal Swab test result.                   A newly identified nodule is defined as any nodule first identified on imaging                   <90 days prior to nasal sample collection that hasnt undergone a diagnostic                    procedure for the management of their index nodule prior to enrollment.                   CT imaging includes conventional CT, LDCT, HRCT                   Benign diagnosis is defined as a specific diagnosis of a benign condition,                    radiographic resolution or stability at ? 24 months, or no cytological,                    radiological, or pathological evidence of cancer.                   Procedures will be categorized as either invasive or non-invasive in the Data                    Management Plan (DMP). Secondary: To evaluate if use of the Percepta Nasal Swab test in the diagnostic work up of newly identified pulmonary nodules increases the proportion of subjects classified as high-risk by the test and have primary lung cancer that go directly to appropriate therapy as compared to a control group managed without a Percepta Nasal Swab test result.                    Proportion of subjects that go directly to appropriate therapy is defined as those                     subjects that undergo surgery, ablative  or other appropriate therapy as the next                     step after the Percepta Nasal Swab test result without intervening non-surgical                     procedures                             a. Non-surgical procedures include diagnostic PET, but not PET for  staging purposes.                             b. Appropriate therapies will be defined in the CRF.                    A newly identified nodule is defined as any nodule first identified on imaging                    <90 days prior to nasal sample collection.                    Lung cancer diagnosis is defined as established by cytology or pathology, or in                     circumstances where a presumptive diagnosis of cancer led to definitive                     ablative or other appropriate therapy without pathology. Key Inclusion Criteria:  Inclusion Criteria:  Able to tolerate nasal epithelial specimen collection  Signed written Informed Consent obtained  Subject clinical history available for review by sponsor and regulatory agencies  New nodule first identified on imaging < 90 days prior to nasal sample collection (index nodule)  CT report available for index nodule  23 - 11 years of age  Current or former smoker (>100 cigarettes in a lifetime)  Pulmonary nodule ?30 mm detected by CT  Key Exclusion Criteria: Exclusion Criteria  Subject has undergone a diagnostic procedure for the management of their index nodule after the index CT and prior to enrollment  Active cancer (other than non-melanoma skin cancer)  Prior primary lung cancer (prior non-lung cancer acceptable)  Prior participation in this study (i.e., subjects may not be enrolled more than once)  Current active treatment with an investigational device or drug (patients in trial follow up period are okay if intervention phase is complete)  Patient enrolled or planned to be enrolled in another clinical trial that may influence  management of the patients nodule  Concurrent or planned use of tools or tests for assigning lung nodule risk of malignancy (e.g., genomic or proteomic blood tests) other than clinically validated risk calculators  Clinical Research Coordinator / Research RN note : This visit is for ARDYCE HEYER Subject [id number] with DOB: 02/25/1946 on 04/21/2021 for the above protocol is an Enrollment Visit and is for purpose of research.    Subject expressed interest and consent in continuing as a study subject. Subject confirmed contact information (e.g. address, telephone, email). Subject thanked for participation in research and contribution to science.     During this visit on 04/21/2021  , the subject reviewed and signed the consent form, provided demographics, and had a nasal swab collected per the above referenced protocol. Please refer to the subject's paper source binder for further details.   PI, Dr. Valeta Harms, was present for consenting process.    Signed by Reino Bellis Research Assistant E. Lopez  04/24/2021 10:45 AM

## 2021-04-24 NOTE — Patient Instructions (Signed)
It was good to talk with you today. Your CT Chest had some enlarged pulmonary nodules.  There is some suspicion that these may be reflective of inflammation or infection We will send in a prescription for Doxycycline, which is an antibiotic. Take 1 tablet twice daily x 7 days, until gone. Take with a full glass of water.  Wear Sunblock if you are in the sun for an extended period of time, as this medication can make you sun sensitive.  We will repeat your Ct Chest in 6-8 weeks to better evaluate these areas.  We will call you closer to the time to get this scheduled.  Please call the office if you have any questions about this plan. Please contact office for sooner follow up if symptoms do not improve or worsen or seek emergency care

## 2021-04-24 NOTE — Progress Notes (Signed)
Virtual Visit via Video Note  I connected with Kayla Mcclure on 04/24/21 at  9:30 AM EST by a video enabled telemedicine application and verified that I am speaking with the correct person using two identifiers.  This visit was done via phone as patient was unable to use her smart phone to initiate a video visit after 2 attempts.   Location: Patient:  At home Provider:  [address], [place], [place], [address]    I discussed the limitations of evaluation and management by telemedicine and the availability of in person appointments. The patient expressed understanding and agreed to proceed.   History of Present Illness: Pt. Is followed through the Lung cancer screening program.She is a current every day smoker with a 20 + pack year smoking history.  Her screening scan 04/12/2021 was read as a 4B. There was notation of New nodules in both upper lobes and the right lower lobe. Infectious and malignant etiologies are considered. I reviewed her scan with Dr. Valeta Harms, who felt these areas looks more inflammatory or infectious.Pt. does not recall being sick. She denies any fever, change color or amount of  secretions from her baseline.  Plan is to treat with Doxycycline BID x 7 days and re-scan in 6 weeks to re-evaluate the areas of concern. The patient is in agreement with this plan. The prescription has been called in to her pharmacy of choice. She has been reminded to wear Sunblock if she is in the sun while on Doxycycline. She verbalized understanding. .    Observations/Objective: Pt. Was in no distress, awake and alert, appropriate.  Assessment and Plan: Abnormal Low Dose CT Chest  Pulmonary nodules Suspicion nodules of concern are reflection of infection/ inflammation Current every day smoker  Plan Doxycycline 100 mg  BID x 7 days and re-scan in 6 weeks to re-evaluate the areas of concern Counseled to quit smoking  Follow Up Instructions: Follow up Low Dose CT Chest without  contrast in 6 weeks    I discussed the assessment and treatment plan with the patient. The patient was provided an opportunity to ask questions and all were answered. The patient agreed with the plan and demonstrated an understanding of the instructions.   The patient was advised to call back or seek an in-person evaluation if the symptoms worsen or if the condition fails to improve as anticipated.  I provided 30 minutes of non-face-to-face time during this encounter.   Magdalen Spatz, NP  04/24/2021

## 2021-05-03 ENCOUNTER — Telehealth: Payer: Self-pay | Admitting: Acute Care

## 2021-05-03 ENCOUNTER — Other Ambulatory Visit: Payer: Self-pay | Admitting: Acute Care

## 2021-05-03 DIAGNOSIS — D696 Thrombocytopenia, unspecified: Secondary | ICD-10-CM | POA: Diagnosis not present

## 2021-05-03 DIAGNOSIS — R918 Other nonspecific abnormal finding of lung field: Secondary | ICD-10-CM | POA: Diagnosis not present

## 2021-05-03 DIAGNOSIS — R9389 Abnormal findings on diagnostic imaging of other specified body structures: Secondary | ICD-10-CM

## 2021-05-03 NOTE — Telephone Encounter (Signed)
I received a call from Dr. [name] re this patient. She saw the patient today in the office. She was concerned that the patient was having a repeat CT Chest vs a PET scan in response to the Lung Cancer screening findings. The patient had not shared with Dr. Mannie Stabile that she had been treated with antibiotics and had a CT Chest scheduled for 3/6 to re-evaluate the nodules noted on the scan. I explained that Dr. Valeta Harms had reviewed the scan, and we suspected an inflammatory component to the result.  Dr. Mannie Stabile shared with me that the patient had been worked up for weight loss, about 10 pounds since 07/2020. I have ordered a PET scan to better evaluate the nodules. The patient has been treated with antibiotics 04/24/2021.  Dr. Mannie Stabile will call the patient and let her know the PET scan has been ordered and that she will get a call to schedule.

## 2021-05-04 ENCOUNTER — Telehealth: Payer: Self-pay | Admitting: *Deleted

## 2021-05-04 NOTE — Telephone Encounter (Signed)
Video visit notes faxed [phone]) to patient's pcp, Marilynne Drivers PA as requested by Eric Form NP.  Fax confirmation received that fax was sent successfully.  Nothing further needed.

## 2021-05-05 DIAGNOSIS — R7303 Prediabetes: Secondary | ICD-10-CM | POA: Diagnosis not present

## 2021-05-05 DIAGNOSIS — I739 Peripheral vascular disease, unspecified: Secondary | ICD-10-CM | POA: Diagnosis not present

## 2021-05-05 DIAGNOSIS — N183 Chronic kidney disease, stage 3 unspecified: Secondary | ICD-10-CM | POA: Diagnosis not present

## 2021-05-05 DIAGNOSIS — E785 Hyperlipidemia, unspecified: Secondary | ICD-10-CM | POA: Diagnosis not present

## 2021-05-05 DIAGNOSIS — I129 Hypertensive chronic kidney disease with stage 1 through stage 4 chronic kidney disease, or unspecified chronic kidney disease: Secondary | ICD-10-CM | POA: Diagnosis not present

## 2021-05-05 DIAGNOSIS — K922 Gastrointestinal hemorrhage, unspecified: Secondary | ICD-10-CM | POA: Diagnosis not present

## 2021-05-05 DIAGNOSIS — Z720 Tobacco use: Secondary | ICD-10-CM | POA: Diagnosis not present

## 2021-05-05 DIAGNOSIS — I2510 Atherosclerotic heart disease of native coronary artery without angina pectoris: Secondary | ICD-10-CM | POA: Diagnosis not present

## 2021-05-05 DIAGNOSIS — N2581 Secondary hyperparathyroidism of renal origin: Secondary | ICD-10-CM | POA: Diagnosis not present

## 2021-05-05 DIAGNOSIS — J449 Chronic obstructive pulmonary disease, unspecified: Secondary | ICD-10-CM | POA: Diagnosis not present

## 2021-05-16 ENCOUNTER — Other Ambulatory Visit: Payer: Self-pay

## 2021-05-16 ENCOUNTER — Encounter (HOSPITAL_COMMUNITY)
Admission: RE | Admit: 2021-05-16 | Discharge: 2021-05-16 | Disposition: A | Discharge status: Home / Self Care | Payer: Medicare Other | Source: Ambulatory Visit | Attending: Acute Care | Admitting: Acute Care

## 2021-05-16 DIAGNOSIS — R9389 Abnormal findings on diagnostic imaging of other specified body structures: Secondary | ICD-10-CM

## 2021-05-16 DIAGNOSIS — I2510 Atherosclerotic heart disease of native coronary artery without angina pectoris: Secondary | ICD-10-CM | POA: Diagnosis not present

## 2021-05-16 DIAGNOSIS — J439 Emphysema, unspecified: Secondary | ICD-10-CM | POA: Diagnosis not present

## 2021-05-16 DIAGNOSIS — I700 Atherosclerosis of aorta: Secondary | ICD-10-CM | POA: Diagnosis not present

## 2021-05-16 LAB — GLUCOSE, CAPILLARY: Glucose-Capillary: 92 mg/dL (ref 70–99)

## 2021-05-16 MED ORDER — FLUDEOXYGLUCOSE F - 18 (FDG) INJECTION
6.1000 | Freq: Once | INTRAVENOUS | Status: AC
  Administered 2021-05-16: 6.01 via INTRAVENOUS

## 2021-05-18 ENCOUNTER — Telehealth: Payer: Self-pay | Admitting: Acute Care

## 2021-05-18 NOTE — Telephone Encounter (Signed)
I have called the patient with the results of her PET scan. I explained that there was a finding of 3 pulmonary nodules of concern on recent CT are all hypermetabolic, favoring multifocal malignancy.I explained that I would like to get her into the office with D. Icard to discuss biopsy. I had tentatively placed her in an opening 05/19/2021 at 4 pm ( To hold the spot). She states she can make that appointment. I gave her location and address. She is familiar with this building as she is seen by Sadie Haber as PCP. She will see Dr. Valeta Harms at 4 pm 05/19/2021.   I will call [facility] and let Dr. Mannie Stabile and Marilynne Drivers PA know of the findings, and that she is scheduled for follow up with Dr. Valeta Harms 05/19/2021 to discuss biopsy options.   I spoke with Dr. Roma Kayser nurse. I explained that the PET was concerning and we have patient scheduled for tomorrow to see DrMarland Kitchen Valeta Harms to discuss biopsy. She will relay the information to Dr. Mannie Stabile.

## 2021-05-19 ENCOUNTER — Other Ambulatory Visit: Payer: Self-pay

## 2021-05-19 ENCOUNTER — Encounter: Payer: Self-pay | Admitting: Pulmonary Disease

## 2021-05-19 ENCOUNTER — Ambulatory Visit (INDEPENDENT_AMBULATORY_CARE_PROVIDER_SITE_OTHER): Payer: Medicare Other | Admitting: Pulmonary Disease

## 2021-05-19 VITALS — BP 112/50 | HR 65 | Temp 97.7°F | Ht 59.0 in | Wt 130.0 lb

## 2021-05-19 DIAGNOSIS — R942 Abnormal results of pulmonary function studies: Secondary | ICD-10-CM | POA: Diagnosis not present

## 2021-05-19 DIAGNOSIS — Z720 Tobacco use: Secondary | ICD-10-CM | POA: Diagnosis not present

## 2021-05-19 DIAGNOSIS — R918 Other nonspecific abnormal finding of lung field: Secondary | ICD-10-CM

## 2021-05-19 NOTE — Patient Instructions (Addendum)
Thank you for visiting Dr. Valeta Harms at [facility]. Today we recommend the following:  Orders Placed This Encounter  Procedures   Procedural/ Surgical Case Request: ROBOTIC ASSISTED NAVIGATIONAL BRONCHOSCOPY   CT Super D Chest Wo Contrast   Ambulatory referral to Pulmonology   March 7th, Bronchoscopy at [facility]   Return in about 25 days (around 06/13/2021) for with Eric Form, NP.    Please do your part to reduce the spread of COVID-19.

## 2021-05-19 NOTE — H&P (View-Only) (Signed)
Synopsis: Referred in February 2023 for pulmonary nodules by Lois Huxley, PA  Subjective:   PATIENT ID: Kayla Mcclure GENDER: female DOB: [birth year], MRN: [id number]  Chief Complaint  Patient presents with   Establish Care    Lung nodules      This is a 76 year old female, past medical history of coronary disease, NSTEMI, CKD, chronic diastolic heart failure, history of MI, history of stroke.  She is a former smoker quit in 2015 she is not on any blood thinners or antiplatelets except for a daily 81 mg aspirin. The patient was enrolled in our lung cancer screening program had a abnormal lung cancer screening CT completed on 04/12/2021 which was read as suspicious for multiple nodules in both lobes upper and right lower.  Also had associated emphysema.  Patient was referred for nuclear medicine PET scan which was completed on 05/16/2021.  Nuclear medicine pet imaging revealed 3 pulmonary nodules all with elevated hypermetabolism the left upper lobe lesion as well as a right upper lobe lesion both with an SUV of greater than 4 and a smaller left upper lobe nodule with an SUV of 2.6.  This was concerning for multifocal malignancy.   Past Medical History:  Diagnosis Date   Acute GI bleeding 08/07/2019   Asthma  10/31/2007   CAD (coronary artery disease) 08/05/2007   a) NSTEMI 2009: PCI to the RCA Promus DES 2.5 mm x 23 mm (3.0 mm). b) Myoview June 2017: Nonspecific ST changes. EF greater than 65%. LOW RISK, normal study. No ischemia or infarction.   Carotid artery disease [facility])    s/p Bilateral CEA March & May 2020.    Chronic back pain    Chronic diastolic heart failure (HCC)    CKD (chronic kidney disease), stage II    Diverticulosis   04/11/2010    Colonoscopy   Dyslipidemia, goal LDL below 70    Essential hypertension    a. Normal renal arteries by PV angio 05/2013.   GERD (gastroesophageal reflux disease)    GI bleed 12/2015   History of kidney stones 1975   History of  recent blood transfusion 12/2015   12-19-15, 12-20-15 and 12-21-15   Insomnia    Internal hemorrhoids  04/11/2010   By colonoscopy   Iron deficiency anemia    Myocardial infarction [facility]) 2009   Peripheral arterial disease ([place])    a. s/p PTA and stenting of L CIA stenosis 05/2013.   Renal insufficiency    Stroke [facility])    mini stroke   Tobacco abuse    Quit in 05/2013     Family History  Problem Relation Age of Onset   Diabetes Mother    Heart disease Mother    Heart attack Mother 33   Breast cancer Mother 42   Colon cancer Mother 55   Heart attack Father 48   Pneumonia Father    Diabetes Sister    Breast cancer Sister    Breast cancer Sister 28     Past Surgical History:  Procedure Laterality Date   CAPSULE ENDOSCOPY  10/27/2019   [facility]) evidence of "pinhole leaking" in both stomach and proximal small bowel; no overt bleeding noted   CHOLECYSTECTOMY  1975   COLONOSCOPY  04/11/2010   COLONOSCOPY  11/30/2015   CORONARY ANGIOPLASTY WITH STENT PLACEMENT  08/05/2007   Promus DES 2.5 mm x 23 mm (3 mm)  to RCA; EF 60-70%   ENDARTERECTOMY Left 07/28/2019   Procedure: ENDARTERECTOMY CAROTID;  Surgeon:  Waynetta Sandy, MD;  Location: [place];  Service: Vascular;  Laterality: Left;   ENDARTERECTOMY Right 08/27/2019   Procedure: ENDARTERECTOMY CAROTID RIGHT WITH PATCH ANGIOPLASTY;  Surgeon: Waynetta Sandy, MD;  Location: [place];  Service: Vascular;  Laterality: Right;   ENTEROSCOPY N/A 12/27/2015   Procedure: ENTEROSCOPY;  Surgeon: Clarene Essex, MD;  Location: [facility] ENDOSCOPY;  Service: Endoscopy;  Laterality: N/A;  also needs slim egd scope   ENTEROSCOPY N/A 05/15/2019   Procedure: ENTEROSCOPY;  Surgeon: Ronald Lobo, MD;  Location: [place];  Service: Endoscopy;  Laterality: N/A;  Push enteroscopy using either ultraslim colonoscope or push enteroscope   ENTEROSCOPY N/A 08/08/2019   Procedure: ENTEROSCOPY;  Surgeon: Ronnette Juniper, MD;  Location: [place];  Service:  Gastroenterology;  Laterality: N/A;   ESOPHAGOGASTRODUODENOSCOPY N/A 09/13/2013   Procedure: ESOPHAGOGASTRODUODENOSCOPY (EGD);  Surgeon: Lear Ng, MD;  Location: [facility] ENDOSCOPY;  Service: Endoscopy;  Laterality: N/A;   ESOPHAGOGASTRODUODENOSCOPY N/A 07/14/2015   Procedure: ESOPHAGOGASTRODUODENOSCOPY (EGD);  Surgeon: Clarene Essex, MD;  Location: [facility] ENDOSCOPY;  Service: Endoscopy;  Laterality: N/A;   ESOPHAGOGASTRODUODENOSCOPY (EGD) WITH PROPOFOL N/A 09/09/2020   Procedure: ESOPHAGOGASTRODUODENOSCOPY (EGD) WITH PROPOFOL;  Surgeon: Clarene Essex, MD;  Location: [place];  Service: Endoscopy;  Laterality: N/A;   gi bleed  07/2015   blood given   HOT HEMOSTASIS N/A 12/27/2015   Procedure: HOT HEMOSTASIS (ARGON PLASMA COAGULATION/BICAP);  Surgeon: Clarene Essex, MD;  Location: [facility];  Service: Endoscopy;  Laterality: N/A;   HOT HEMOSTASIS N/A 08/08/2019   Procedure: HOT HEMOSTASIS (ARGON PLASMA COAGULATION/BICAP);  Surgeon: Ronnette Juniper, MD;  Location: [place];  Service: Gastroenterology;  Laterality: N/A;   HOT HEMOSTASIS N/A 09/09/2020   Procedure: HOT HEMOSTASIS (ARGON PLASMA COAGULATION/BICAP);  Surgeon: Clarene Essex, MD;  Location: [place];  Service: Endoscopy;  Laterality: N/A;   ILIAC ARTERY STENT Left 05/04/2013   L CIA-EIA -- Dr. Gwenlyn Found   LEFT HEART CATH AND CORONARY ANGIOGRAPHY N/A 02/11/2019   Procedure: LEFT HEART CATH AND CORONARY ANGIOGRAPHY;  Surgeon: Leonie Man, MD;  Location: [place] CV LAB;;-  Patent RCA stent with minimal ISR after pRCA 50%).  Small caliber RPAV with ~75%.  pLAD 30%, m-dLAD 40%, D2 40%.  Normal LVEDP and LVEF.   LOWER EXTREMITY ANGIOGRAM Bilateral 05/04/2013   Procedure: LOWER EXTREMITY ANGIOGRAM;  Surgeon: Lorretta Harp, MD;  Location: [facility] CATH LAB;  Service: Cardiovascular;  Laterality: Bilateral;   NM MYOVIEW LTD  09/07/2015   EF > 65%. Nonspecific ST changes but no ischemic changes. No ischemia or infarction. LOW RISK   PATCH ANGIOPLASTY  Left 07/28/2019   Procedure: PATCH ANGIOPLASTY USING Rueben Bash BIOLOGIC PATCH;  Surgeon: Waynetta Sandy, MD;  Location: [place];  Service: Vascular;  Laterality: Left;   TONSILLECTOMY  1960's   TRANSTHORACIC ECHOCARDIOGRAM  05/14/2019   Normal EF 60 to 65%.  No R WMA.   Normal RV but mildly elevated pressures.  Relatively normal aortic and mitral valves.   UPPER GI ENDOSCOPY  12/14/2015    Social History   Socioeconomic History   Marital status: Married    Spouse name: Not on file   Number of children: 1   Years of education: 12   Highest education level: High school graduate  Occupational History    Employer: RETIRED  Tobacco Use   Smoking status: Light Smoker    Types: Cigarettes    Last attempt to quit: 05/04/2013    Years since quitting: 8.0   Smokeless tobacco: Never  Tobacco comments:    She initially quit, but has gone back to smoking a couple cigarettes every now and then.  Vaping Use   Vaping Use: Never used  Substance and Sexual Activity   Alcohol use: Yes    Comment: occasionally 3 times a year   Drug use: No   Sexual activity: Not Currently    Partners: Male    Birth control/protection: None  Other Topics Concern   Not on file  Social History Narrative   Married. Has one child. Grandmother for a great-grandmother and 1.   No real exercise.   Occasional alcohol consumption.   Quit smoking inf Feb 2015 -- after L Iliac Stent   Right-handed   Caffeine: 1 cup every morning   Right-handed.   Lives at home with husband.   Social Determinants of Health   Financial Resource Strain: Not on file  Food Insecurity: Not on file  Transportation Needs: Not on file  Physical Activity: Not on file  Stress: Not on file  Social Connections: Not on file  Intimate Partner Violence: Not on file     No Known Allergies   Outpatient Medications Prior to Visit  Medication Sig Dispense Refill   albuterol (VENTOLIN HFA) 108 (90 Base) MCG/ACT inhaler Inhale 2 puffs  into the lungs every 6 (six) hours as needed for wheezing or shortness of breath.     alendronate (FOSAMAX) 70 MG tablet Take 70 mg by mouth once a week.     amLODipine (NORVASC) 5 MG tablet Take 1 tablet (5 mg total) by mouth daily. 90 tablet 3   aspirin EC 81 MG tablet Take 1 tablet (81 mg total) by mouth daily. 30 tablet    atorvastatin (LIPITOR) 80 MG tablet Take 1 tablet (80 mg total) by mouth daily. 90 tablet 3   Cholecalciferol (VITAMIN D3) 1.25 MG (50000 UT) CAPS Take 1 capsule by mouth once a week.     doxycycline (VIBRA-TABS) 100 MG tablet Take 1 tablet (100 mg total) by mouth 2 (two) times daily. 14 tablet 0   ezetimibe (ZETIA) 10 MG tablet Take 1 tablet (10 mg total) by mouth daily. 90 tablet 3   ferrous gluconate (FERGON) 324 MG tablet Take 324 mg by mouth daily with breakfast.     furosemide (LASIX) 20 MG tablet Take 20 mg  One tablet on Monday , Wednesday , Fridays , may take an additional  20 mg ff needed 90 tablet 3   isosorbide mononitrate (IMDUR) 60 MG 24 hr tablet Take 1 tablet (60 mg total) by mouth daily. 90 tablet 3   metoprolol succinate (TOPROL-XL) 100 MG 24 hr tablet Take 2 tablets (200 mg total) by mouth at bedtime. 180 tablet 3   nitroGLYCERIN (NITROSTAT) 0.4 MG SL tablet Place 1 tablet (0.4 mg total) under the tongue every 5 (five) minutes as needed for chest pain. 25 tablet 6   ondansetron (ZOFRAN) 4 MG tablet Take 1 tablet (4 mg total) by mouth every 6 (six) hours as needed for nausea. 20 tablet 0   pantoprazole (PROTONIX) 40 MG tablet Take 1 tablet (40 mg total) by mouth daily. 30 tablet 0   valsartan (DIOVAN) 80 MG tablet Take 1 tablet (80 mg total) by mouth daily. 90 tablet 3   No facility-administered medications prior to visit.    ROS   Objective:  Physical Exam   Vitals:   05/19/21 1551  BP: (!) 112/50  Pulse: 65  Temp: 97.7 F (36.5 C)  TempSrc: Oral  SpO2: 94%  Weight: 130 lb (59 kg)  Height: 4\' 11"  (1.499 m)   94% on RA BMI Readings from  Last 3 Encounters:  05/19/21 26.26 kg/m  01/20/21 24.64 kg/m  09/09/20 27.87 kg/m   Wt Readings from Last 3 Encounters:  05/19/21 130 lb (59 kg)  01/20/21 122 lb (55.3 kg)  09/09/20 138 lb 0.1 oz (62.6 kg)     CBC    Component Value Date/Time   WBC 6.0 09/09/2020 0015   RBC 3.73 (L) 09/09/2020 0015   HGB 11.0 (L) 09/09/2020 0015   HGB 12.0 02/05/2019 0836   HCT 33.9 (L) 09/09/2020 0015   HCT 38.5 02/05/2019 0836   PLT 134 (L) 09/09/2020 0015   PLT 212 02/05/2019 0836   MCV 90.9 09/09/2020 0015   MCV 81 02/05/2019 0836   MCH 29.5 09/09/2020 0015   MCHC 32.4 09/09/2020 0015   RDW 17.2 (H) 09/09/2020 0015   RDW 17.3 (H) 02/05/2019 0836   LYMPHSABS 1.7 08/06/2019 1652   MONOABS 0.9 08/06/2019 1652   EOSABS 0.1 08/06/2019 1652   BASOSABS 0.0 08/06/2019 1652     Chest Imaging: Nuclear medicine pet imaging February 2505: Hypermetabolic nodules within the right and left lung concerning for multifocal carcinoma. The patient's images have been independently reviewed by me.    Pulmonary Functions Testing Results: No flowsheet data found.  FeNO:   Pathology:   Echocardiogram:   Heart Catheterization:     Assessment & Plan:     ICD-10-CM   1. Lung nodules  R91.8 Ambulatory referral to Pulmonology    CT Super D Chest Wo Contrast    Procedural/ Surgical Case Request: ROBOTIC ASSISTED NAVIGATIONAL BRONCHOSCOPY    2. Abnormal PET scan, lung  R94.2     3. Abnormal CT lung screening  R91.8     4. Tobacco abuse  Z72.0       Discussion:  This is a 76 year old female recent abnormal lung cancer screening CT with bilateral pulmonary nodules both of which are hypermetabolic on PET scan concerning for multifocal lung malignancy.  Plan: Today in the office we discussed the risk benefits and alternatives of proceeding with navigational bronchoscopy and tissue sampling. We talked about the risk of bleeding and pneumothorax. Patient is not on any blood thinners or  antiplatelets. We reviewed her CT imaging as well as nuclear medicine pet imaging today in the office. I explained that we would attempt to biopsy both of the small lesions in the upper lobes as well as one of the smaller lesions identified on pet imaging. Patient is agreeable to this plan. We appreciate the PCC's help with scheduling.  Bronchoscopy to be scheduled on June 06, 2021.    Current Outpatient Medications:    albuterol (VENTOLIN HFA) 108 (90 Base) MCG/ACT inhaler, Inhale 2 puffs into the lungs every 6 (six) hours as needed for wheezing or shortness of breath., Disp: , Rfl:    alendronate (FOSAMAX) 70 MG tablet, Take 70 mg by mouth once a week., Disp: , Rfl:    amLODipine (NORVASC) 5 MG tablet, Take 1 tablet (5 mg total) by mouth daily., Disp: 90 tablet, Rfl: 3   aspirin EC 81 MG tablet, Take 1 tablet (81 mg total) by mouth daily., Disp: 30 tablet, Rfl:    atorvastatin (LIPITOR) 80 MG tablet, Take 1 tablet (80 mg total) by mouth daily., Disp: 90 tablet, Rfl: 3   Cholecalciferol (VITAMIN D3) 1.25 MG (50000 UT) CAPS, Take 1 capsule  by mouth once a week., Disp: , Rfl:    doxycycline (VIBRA-TABS) 100 MG tablet, Take 1 tablet (100 mg total) by mouth 2 (two) times daily., Disp: 14 tablet, Rfl: 0   ezetimibe (ZETIA) 10 MG tablet, Take 1 tablet (10 mg total) by mouth daily., Disp: 90 tablet, Rfl: 3   ferrous gluconate (FERGON) 324 MG tablet, Take 324 mg by mouth daily with breakfast., Disp: , Rfl:    furosemide (LASIX) 20 MG tablet, Take 20 mg  One tablet on Monday , Wednesday , Fridays , may take an additional  20 mg ff needed, Disp: 90 tablet, Rfl: 3   isosorbide mononitrate (IMDUR) 60 MG 24 hr tablet, Take 1 tablet (60 mg total) by mouth daily., Disp: 90 tablet, Rfl: 3   metoprolol succinate (TOPROL-XL) 100 MG 24 hr tablet, Take 2 tablets (200 mg total) by mouth at bedtime., Disp: 180 tablet, Rfl: 3   nitroGLYCERIN (NITROSTAT) 0.4 MG SL tablet, Place 1 tablet (0.4 mg total) under the  tongue every 5 (five) minutes as needed for chest pain., Disp: 25 tablet, Rfl: 6   ondansetron (ZOFRAN) 4 MG tablet, Take 1 tablet (4 mg total) by mouth every 6 (six) hours as needed for nausea., Disp: 20 tablet, Rfl: 0   pantoprazole (PROTONIX) 40 MG tablet, Take 1 tablet (40 mg total) by mouth daily., Disp: 30 tablet, Rfl: 0   valsartan (DIOVAN) 80 MG tablet, Take 1 tablet (80 mg total) by mouth daily., Disp: 90 tablet, Rfl: 3  I spent 63 minutes dedicated to the care of this patient on the date of this encounter to include pre-visit review of records, face-to-face time with the patient discussing conditions above, post visit ordering of testing, clinical documentation with the electronic health record, making appropriate referrals as documented, and communicating necessary findings to members of the patients care team.   Garner Nash, DO [place] [facility] 05/19/2021 4:29 PM

## 2021-05-19 NOTE — H&P (View-Only) (Signed)
Synopsis: Referred in February 2023 for pulmonary nodules by Lois Huxley, PA  Subjective:   PATIENT ID: Kayla Mcclure GENDER: female DOB: [birth year], MRN: [id number]  Chief Complaint  Patient presents with   Establish Care    Lung nodules      This is a 76 year old female, past medical history of coronary disease, NSTEMI, CKD, chronic diastolic heart failure, history of MI, history of stroke.  She is a former smoker quit in 2015 she is not on any blood thinners or antiplatelets except for a daily 81 mg aspirin. The patient was enrolled in our lung cancer screening program had a abnormal lung cancer screening CT completed on 04/12/2021 which was read as suspicious for multiple nodules in both lobes upper and right lower.  Also had associated emphysema.  Patient was referred for nuclear medicine PET scan which was completed on 05/16/2021.  Nuclear medicine pet imaging revealed 3 pulmonary nodules all with elevated hypermetabolism the left upper lobe lesion as well as a right upper lobe lesion both with an SUV of greater than 4 and a smaller left upper lobe nodule with an SUV of 2.6.  This was concerning for multifocal malignancy.   Past Medical History:  Diagnosis Date   Acute GI bleeding 08/07/2019   Asthma  10/31/2007   CAD (coronary artery disease) 08/05/2007   a) NSTEMI 2009: PCI to the RCA Promus DES 2.5 mm x 23 mm (3.0 mm). b) Myoview June 2017: Nonspecific ST changes. EF greater than 65%. LOW RISK, normal study. No ischemia or infarction.   Carotid artery disease [facility])    s/p Bilateral CEA March & May 2020.    Chronic back pain    Chronic diastolic heart failure (HCC)    CKD (chronic kidney disease), stage II    Diverticulosis   04/11/2010    Colonoscopy   Dyslipidemia, goal LDL below 70    Essential hypertension    a. Normal renal arteries by PV angio 05/2013.   GERD (gastroesophageal reflux disease)    GI bleed 12/2015   History of kidney stones 1975   History of  recent blood transfusion 12/2015   12-19-15, 12-20-15 and 12-21-15   Insomnia    Internal hemorrhoids  04/11/2010   By colonoscopy   Iron deficiency anemia    Myocardial infarction [facility]) 2009   Peripheral arterial disease ([facility])    a. s/p PTA and stenting of L CIA stenosis 05/2013.   Renal insufficiency    Stroke [facility])    mini stroke   Tobacco abuse    Quit in 05/2013     Family History  Problem Relation Age of Onset   Diabetes Mother    Heart disease Mother    Heart attack Mother 44   Breast cancer Mother 15   Colon cancer Mother 85   Heart attack Father 76   Pneumonia Father    Diabetes Sister    Breast cancer Sister    Breast cancer Sister 47     Past Surgical History:  Procedure Laterality Date   CAPSULE ENDOSCOPY  10/27/2019   [facility]) evidence of "pinhole leaking" in both stomach and proximal small bowel; no overt bleeding noted   CHOLECYSTECTOMY  1975   COLONOSCOPY  04/11/2010   COLONOSCOPY  11/30/2015   CORONARY ANGIOPLASTY WITH STENT PLACEMENT  08/05/2007   Promus DES 2.5 mm x 23 mm (3 mm)  to RCA; EF 60-70%   ENDARTERECTOMY Left 07/28/2019   Procedure: ENDARTERECTOMY CAROTID;  Surgeon:  Waynetta Sandy, MD;  Location: [place];  Service: Vascular;  Laterality: Left;   ENDARTERECTOMY Right 08/27/2019   Procedure: ENDARTERECTOMY CAROTID RIGHT WITH PATCH ANGIOPLASTY;  Surgeon: Waynetta Sandy, MD;  Location: [place];  Service: Vascular;  Laterality: Right;   ENTEROSCOPY N/A 12/27/2015   Procedure: ENTEROSCOPY;  Surgeon: Clarene Essex, MD;  Location: [facility] ENDOSCOPY;  Service: Endoscopy;  Laterality: N/A;  also needs slim egd scope   ENTEROSCOPY N/A 05/15/2019   Procedure: ENTEROSCOPY;  Surgeon: Ronald Lobo, MD;  Location: [place];  Service: Endoscopy;  Laterality: N/A;  Push enteroscopy using either ultraslim colonoscope or push enteroscope   ENTEROSCOPY N/A 08/08/2019   Procedure: ENTEROSCOPY;  Surgeon: Ronnette Juniper, MD;  Location: [place];  Service:  Gastroenterology;  Laterality: N/A;   ESOPHAGOGASTRODUODENOSCOPY N/A 09/13/2013   Procedure: ESOPHAGOGASTRODUODENOSCOPY (EGD);  Surgeon: Lear Ng, MD;  Location: [facility] ENDOSCOPY;  Service: Endoscopy;  Laterality: N/A;   ESOPHAGOGASTRODUODENOSCOPY N/A 07/14/2015   Procedure: ESOPHAGOGASTRODUODENOSCOPY (EGD);  Surgeon: Clarene Essex, MD;  Location: [facility] ENDOSCOPY;  Service: Endoscopy;  Laterality: N/A;   ESOPHAGOGASTRODUODENOSCOPY (EGD) WITH PROPOFOL N/A 09/09/2020   Procedure: ESOPHAGOGASTRODUODENOSCOPY (EGD) WITH PROPOFOL;  Surgeon: Clarene Essex, MD;  Location: [place];  Service: Endoscopy;  Laterality: N/A;   gi bleed  07/2015   blood given   HOT HEMOSTASIS N/A 12/27/2015   Procedure: HOT HEMOSTASIS (ARGON PLASMA COAGULATION/BICAP);  Surgeon: Clarene Essex, MD;  Location: [facility];  Service: Endoscopy;  Laterality: N/A;   HOT HEMOSTASIS N/A 08/08/2019   Procedure: HOT HEMOSTASIS (ARGON PLASMA COAGULATION/BICAP);  Surgeon: Ronnette Juniper, MD;  Location: [place];  Service: Gastroenterology;  Laterality: N/A;   HOT HEMOSTASIS N/A 09/09/2020   Procedure: HOT HEMOSTASIS (ARGON PLASMA COAGULATION/BICAP);  Surgeon: Clarene Essex, MD;  Location: [place];  Service: Endoscopy;  Laterality: N/A;   ILIAC ARTERY STENT Left 05/04/2013   L CIA-EIA -- Dr. Gwenlyn Found   LEFT HEART CATH AND CORONARY ANGIOGRAPHY N/A 02/11/2019   Procedure: LEFT HEART CATH AND CORONARY ANGIOGRAPHY;  Surgeon: Leonie Man, MD;  Location: [place] CV LAB;;-  Patent RCA stent with minimal ISR after pRCA 50%).  Small caliber RPAV with ~75%.  pLAD 30%, m-dLAD 40%, D2 40%.  Normal LVEDP and LVEF.   LOWER EXTREMITY ANGIOGRAM Bilateral 05/04/2013   Procedure: LOWER EXTREMITY ANGIOGRAM;  Surgeon: Lorretta Harp, MD;  Location: [facility] CATH LAB;  Service: Cardiovascular;  Laterality: Bilateral;   NM MYOVIEW LTD  09/07/2015   EF > 65%. Nonspecific ST changes but no ischemic changes. No ischemia or infarction. LOW RISK   PATCH ANGIOPLASTY  Left 07/28/2019   Procedure: PATCH ANGIOPLASTY USING Rueben Bash BIOLOGIC PATCH;  Surgeon: Waynetta Sandy, MD;  Location: [place];  Service: Vascular;  Laterality: Left;   TONSILLECTOMY  1960's   TRANSTHORACIC ECHOCARDIOGRAM  05/14/2019   Normal EF 60 to 65%.  No R WMA.   Normal RV but mildly elevated pressures.  Relatively normal aortic and mitral valves.   UPPER GI ENDOSCOPY  12/14/2015    Social History   Socioeconomic History   Marital status: Married    Spouse name: Not on file   Number of children: 1   Years of education: 12   Highest education level: High school graduate  Occupational History    Employer: RETIRED  Tobacco Use   Smoking status: Light Smoker    Types: Cigarettes    Last attempt to quit: 05/04/2013    Years since quitting: 8.0   Smokeless tobacco: Never  Tobacco comments:    She initially quit, but has gone back to smoking a couple cigarettes every now and then.  Vaping Use   Vaping Use: Never used  Substance and Sexual Activity   Alcohol use: Yes    Comment: occasionally 3 times a year   Drug use: No   Sexual activity: Not Currently    Partners: Male    Birth control/protection: None  Other Topics Concern   Not on file  Social History Narrative   Married. Has one child. Grandmother for a great-grandmother and 1.   No real exercise.   Occasional alcohol consumption.   Quit smoking inf Feb 2015 -- after L Iliac Stent   Right-handed   Caffeine: 1 cup every morning   Right-handed.   Lives at home with husband.   Social Determinants of Health   Financial Resource Strain: Not on file  Food Insecurity: Not on file  Transportation Needs: Not on file  Physical Activity: Not on file  Stress: Not on file  Social Connections: Not on file  Intimate Partner Violence: Not on file     No Known Allergies   Outpatient Medications Prior to Visit  Medication Sig Dispense Refill   albuterol (VENTOLIN HFA) 108 (90 Base) MCG/ACT inhaler Inhale 2 puffs  into the lungs every 6 (six) hours as needed for wheezing or shortness of breath.     alendronate (FOSAMAX) 70 MG tablet Take 70 mg by mouth once a week.     amLODipine (NORVASC) 5 MG tablet Take 1 tablet (5 mg total) by mouth daily. 90 tablet 3   aspirin EC 81 MG tablet Take 1 tablet (81 mg total) by mouth daily. 30 tablet    atorvastatin (LIPITOR) 80 MG tablet Take 1 tablet (80 mg total) by mouth daily. 90 tablet 3   Cholecalciferol (VITAMIN D3) 1.25 MG (50000 UT) CAPS Take 1 capsule by mouth once a week.     doxycycline (VIBRA-TABS) 100 MG tablet Take 1 tablet (100 mg total) by mouth 2 (two) times daily. 14 tablet 0   ezetimibe (ZETIA) 10 MG tablet Take 1 tablet (10 mg total) by mouth daily. 90 tablet 3   ferrous gluconate (FERGON) 324 MG tablet Take 324 mg by mouth daily with breakfast.     furosemide (LASIX) 20 MG tablet Take 20 mg  One tablet on Monday , Wednesday , Fridays , may take an additional  20 mg ff needed 90 tablet 3   isosorbide mononitrate (IMDUR) 60 MG 24 hr tablet Take 1 tablet (60 mg total) by mouth daily. 90 tablet 3   metoprolol succinate (TOPROL-XL) 100 MG 24 hr tablet Take 2 tablets (200 mg total) by mouth at bedtime. 180 tablet 3   nitroGLYCERIN (NITROSTAT) 0.4 MG SL tablet Place 1 tablet (0.4 mg total) under the tongue every 5 (five) minutes as needed for chest pain. 25 tablet 6   ondansetron (ZOFRAN) 4 MG tablet Take 1 tablet (4 mg total) by mouth every 6 (six) hours as needed for nausea. 20 tablet 0   pantoprazole (PROTONIX) 40 MG tablet Take 1 tablet (40 mg total) by mouth daily. 30 tablet 0   valsartan (DIOVAN) 80 MG tablet Take 1 tablet (80 mg total) by mouth daily. 90 tablet 3   No facility-administered medications prior to visit.    Review of Systems  Constitutional:  Negative for chills, fever, malaise/fatigue and weight loss.  HENT:  Negative for hearing loss, sore throat and tinnitus.   Eyes:  Negative for blurred vision and double vision.  Respiratory:   Negative for cough, hemoptysis, sputum production, shortness of breath, wheezing and stridor.   Cardiovascular:  Negative for chest pain, palpitations, orthopnea, leg swelling and PND.  Gastrointestinal:  Negative for abdominal pain, constipation, diarrhea, heartburn, nausea and vomiting.  Genitourinary:  Negative for dysuria, hematuria and urgency.  Musculoskeletal:  Negative for joint pain and myalgias.  Skin:  Negative for itching and rash.  Neurological:  Negative for dizziness, tingling, weakness and headaches.  Endo/Heme/Allergies:  Negative for environmental allergies. Does not bruise/bleed easily.  Psychiatric/Behavioral:  Negative for depression. The patient is not nervous/anxious and does not have insomnia.   All other systems reviewed and are negative.   Objective:  Physical Exam Vitals reviewed.  Constitutional:      General: She is not in acute distress.    Appearance: She is well-developed.  HENT:     Head: Normocephalic and atraumatic.  Eyes:     General: No scleral icterus.    Conjunctiva/sclera: Conjunctivae normal.     Pupils: Pupils are equal, round, and reactive to light.  Neck:     Vascular: No JVD.     Trachea: No tracheal deviation.  Cardiovascular:     Rate and Rhythm: Normal rate and regular rhythm.     Heart sounds: Normal heart sounds. No murmur heard. Pulmonary:     Effort: Pulmonary effort is normal. No tachypnea, accessory muscle usage or respiratory distress.     Breath sounds: No stridor. No wheezing, rhonchi or rales.  Abdominal:     General: There is no distension.     Palpations: Abdomen is soft.     Tenderness: There is no abdominal tenderness.  Musculoskeletal:        General: No tenderness.     Cervical back: Neck supple.  Lymphadenopathy:     Cervical: No cervical adenopathy.  Skin:    General: Skin is warm and dry.     Capillary Refill: Capillary refill takes less than 2 seconds.     Findings: No rash.  Neurological:     Mental  Status: She is alert and oriented to person, place, and time.  Psychiatric:        Behavior: Behavior normal.     Vitals:   05/19/21 1551  BP: (!) 112/50  Pulse: 65  Temp: 97.7 F (36.5 C)  TempSrc: Oral  SpO2: 94%  Weight: 130 lb (59 kg)  Height: 4\' 11"  (1.499 m)   94% on RA BMI Readings from Last 3 Encounters:  05/19/21 26.26 kg/m  01/20/21 24.64 kg/m  09/09/20 27.87 kg/m   Wt Readings from Last 3 Encounters:  05/19/21 130 lb (59 kg)  01/20/21 122 lb (55.3 kg)  09/09/20 138 lb 0.1 oz (62.6 kg)     CBC    Component Value Date/Time   WBC 6.0 09/09/2020 0015   RBC 3.73 (L) 09/09/2020 0015   HGB 11.0 (L) 09/09/2020 0015   HGB 12.0 02/05/2019 0836   HCT 33.9 (L) 09/09/2020 0015   HCT 38.5 02/05/2019 0836   PLT 134 (L) 09/09/2020 0015   PLT 212 02/05/2019 0836   MCV 90.9 09/09/2020 0015   MCV 81 02/05/2019 0836   MCH 29.5 09/09/2020 0015   MCHC 32.4 09/09/2020 0015   RDW 17.2 (H) 09/09/2020 0015   RDW 17.3 (H) 02/05/2019 0836   LYMPHSABS 1.7 08/06/2019 1652   MONOABS 0.9 08/06/2019 1652   EOSABS 0.1 08/06/2019 1652   BASOSABS 0.0 08/06/2019  1652     Chest Imaging: Nuclear medicine pet imaging February 2671: Hypermetabolic nodules within the right and left lung concerning for multifocal carcinoma. The patient's images have been independently reviewed by me.    Pulmonary Functions Testing Results: No flowsheet data found.  FeNO:   Pathology:   Echocardiogram:   Heart Catheterization:     Assessment & Plan:     ICD-10-CM   1. Lung nodules  R91.8 Ambulatory referral to Pulmonology    CT Super D Chest Wo Contrast    Procedural/ Surgical Case Request: ROBOTIC ASSISTED NAVIGATIONAL BRONCHOSCOPY    2. Abnormal PET scan, lung  R94.2     3. Abnormal CT lung screening  R91.8     4. Tobacco abuse  Z72.0       Discussion:  This is a 76 year old female recent abnormal lung cancer screening CT with bilateral pulmonary nodules both of which are  hypermetabolic on PET scan concerning for multifocal lung malignancy.  Plan: Today in the office we discussed the risk benefits and alternatives of proceeding with navigational bronchoscopy and tissue sampling. We talked about the risk of bleeding and pneumothorax. Patient is not on any blood thinners or antiplatelets. We reviewed her CT imaging as well as nuclear medicine pet imaging today in the office. I explained that we would attempt to biopsy both of the small lesions in the upper lobes as well as one of the smaller lesions identified on pet imaging. Patient is agreeable to this plan. We appreciate the PCC's help with scheduling.  Bronchoscopy to be scheduled on June 06, 2021.    Current Outpatient Medications:    albuterol (VENTOLIN HFA) 108 (90 Base) MCG/ACT inhaler, Inhale 2 puffs into the lungs every 6 (six) hours as needed for wheezing or shortness of breath., Disp: , Rfl:    alendronate (FOSAMAX) 70 MG tablet, Take 70 mg by mouth once a week., Disp: , Rfl:    amLODipine (NORVASC) 5 MG tablet, Take 1 tablet (5 mg total) by mouth daily., Disp: 90 tablet, Rfl: 3   aspirin EC 81 MG tablet, Take 1 tablet (81 mg total) by mouth daily., Disp: 30 tablet, Rfl:    atorvastatin (LIPITOR) 80 MG tablet, Take 1 tablet (80 mg total) by mouth daily., Disp: 90 tablet, Rfl: 3   Cholecalciferol (VITAMIN D3) 1.25 MG (50000 UT) CAPS, Take 1 capsule by mouth once a week., Disp: , Rfl:    doxycycline (VIBRA-TABS) 100 MG tablet, Take 1 tablet (100 mg total) by mouth 2 (two) times daily., Disp: 14 tablet, Rfl: 0   ezetimibe (ZETIA) 10 MG tablet, Take 1 tablet (10 mg total) by mouth daily., Disp: 90 tablet, Rfl: 3   ferrous gluconate (FERGON) 324 MG tablet, Take 324 mg by mouth daily with breakfast., Disp: , Rfl:    furosemide (LASIX) 20 MG tablet, Take 20 mg  One tablet on Monday , Wednesday , Fridays , may take an additional  20 mg ff needed, Disp: 90 tablet, Rfl: 3   isosorbide mononitrate (IMDUR) 60  MG 24 hr tablet, Take 1 tablet (60 mg total) by mouth daily., Disp: 90 tablet, Rfl: 3   metoprolol succinate (TOPROL-XL) 100 MG 24 hr tablet, Take 2 tablets (200 mg total) by mouth at bedtime., Disp: 180 tablet, Rfl: 3   nitroGLYCERIN (NITROSTAT) 0.4 MG SL tablet, Place 1 tablet (0.4 mg total) under the tongue every 5 (five) minutes as needed for chest pain., Disp: 25 tablet, Rfl: 6   ondansetron (ZOFRAN)  4 MG tablet, Take 1 tablet (4 mg total) by mouth every 6 (six) hours as needed for nausea., Disp: 20 tablet, Rfl: 0   pantoprazole (PROTONIX) 40 MG tablet, Take 1 tablet (40 mg total) by mouth daily., Disp: 30 tablet, Rfl: 0   valsartan (DIOVAN) 80 MG tablet, Take 1 tablet (80 mg total) by mouth daily., Disp: 90 tablet, Rfl: 3  I spent 63 minutes dedicated to the care of this patient on the date of this encounter to include pre-visit review of records, face-to-face time with the patient discussing conditions above, post visit ordering of testing, clinical documentation with the electronic health record, making appropriate referrals as documented, and communicating necessary findings to members of the patients care team.   Garner Nash, DO [facility] 05/19/2021 4:29 PM

## 2021-05-19 NOTE — Progress Notes (Addendum)
Synopsis: Referred in February 2023 for pulmonary nodules by Lois Huxley, PA  Subjective:   PATIENT ID: Kayla Mcclure GENDER: female DOB: 21-Jun-1945, MRN: [id number]  Chief Complaint  Patient presents with   Establish Care    Lung nodules      This is a 76 year old female, past medical history of coronary disease, NSTEMI, CKD, chronic diastolic heart failure, history of MI, history of stroke.  She is a former smoker quit in 2015 she is not on any blood thinners or antiplatelets except for a daily 81 mg aspirin. The patient was enrolled in our lung cancer screening program had a abnormal lung cancer screening CT completed on 04/12/2021 which was read as suspicious for multiple nodules in both lobes upper and right lower.  Also had associated emphysema.  Patient was referred for nuclear medicine PET scan which was completed on 05/16/2021.  Nuclear medicine pet imaging revealed 3 pulmonary nodules all with elevated hypermetabolism the left upper lobe lesion as well as a right upper lobe lesion both with an SUV of greater than 4 and a smaller left upper lobe nodule with an SUV of 2.6.  This was concerning for multifocal malignancy.   Past Medical History:  Diagnosis Date   Acute GI bleeding 08/07/2019   Asthma  10/31/2007   CAD (coronary artery disease) 08/05/2007   a) NSTEMI 2009: PCI to the RCA Promus DES 2.5 mm x 23 mm (3.0 mm). b) Myoview June 2017: Nonspecific ST changes. EF greater than 65%. LOW RISK, normal study. No ischemia or infarction.   Carotid artery disease [facility])    s/p Bilateral CEA March & May 2020.    Chronic back pain    Chronic diastolic heart failure (HCC)    CKD (chronic kidney disease), stage II    Diverticulosis   04/11/2010    Colonoscopy   Dyslipidemia, goal LDL below 70    Essential hypertension    a. Normal renal arteries by PV angio 05/2013.   GERD (gastroesophageal reflux disease)    GI bleed 12/2015   History of kidney stones 1975   History of  recent blood transfusion 12/2015   12-19-15, 12-20-15 and 12-21-15   Insomnia    Internal hemorrhoids  04/11/2010   By colonoscopy   Iron deficiency anemia    Myocardial infarction [facility]) 2009   Peripheral arterial disease ([place])    a. s/p PTA and stenting of L CIA stenosis 05/2013.   Renal insufficiency    Stroke [facility])    mini stroke   Tobacco abuse    Quit in 05/2013     Family History  Problem Relation Age of Onset   Diabetes Mother    Heart disease Mother    Heart attack Mother 5   Breast cancer Mother 29   Colon cancer Mother 64   Heart attack Father 47   Pneumonia Father    Diabetes Sister    Breast cancer Sister    Breast cancer Sister 37     Past Surgical History:  Procedure Laterality Date   CAPSULE ENDOSCOPY  10/27/2019   [facility]) evidence of "pinhole leaking" in both stomach and proximal small bowel; no overt bleeding noted   CHOLECYSTECTOMY  1975   COLONOSCOPY  04/11/2010   COLONOSCOPY  11/30/2015   CORONARY ANGIOPLASTY WITH STENT PLACEMENT  08/05/2007   Promus DES 2.5 mm x 23 mm (3 mm)  to RCA; EF 60-70%   ENDARTERECTOMY Left 07/28/2019   Procedure: ENDARTERECTOMY CAROTID;  Surgeon:  Waynetta Sandy, MD;  Location: [place];  Service: Vascular;  Laterality: Left;   ENDARTERECTOMY Right 08/27/2019   Procedure: ENDARTERECTOMY CAROTID RIGHT WITH PATCH ANGIOPLASTY;  Surgeon: Waynetta Sandy, MD;  Location: [place];  Service: Vascular;  Laterality: Right;   ENTEROSCOPY N/A 12/27/2015   Procedure: ENTEROSCOPY;  Surgeon: Clarene Essex, MD;  Location: [facility] ENDOSCOPY;  Service: Endoscopy;  Laterality: N/A;  also needs slim egd scope   ENTEROSCOPY N/A 05/15/2019   Procedure: ENTEROSCOPY;  Surgeon: Ronald Lobo, MD;  Location: [place];  Service: Endoscopy;  Laterality: N/A;  Push enteroscopy using either ultraslim colonoscope or push enteroscope   ENTEROSCOPY N/A 08/08/2019   Procedure: ENTEROSCOPY;  Surgeon: Ronnette Juniper, MD;  Location: [place];  Service:  Gastroenterology;  Laterality: N/A;   ESOPHAGOGASTRODUODENOSCOPY N/A 09/13/2013   Procedure: ESOPHAGOGASTRODUODENOSCOPY (EGD);  Surgeon: Lear Ng, MD;  Location: [facility] ENDOSCOPY;  Service: Endoscopy;  Laterality: N/A;   ESOPHAGOGASTRODUODENOSCOPY N/A 07/14/2015   Procedure: ESOPHAGOGASTRODUODENOSCOPY (EGD);  Surgeon: Clarene Essex, MD;  Location: [facility] ENDOSCOPY;  Service: Endoscopy;  Laterality: N/A;   ESOPHAGOGASTRODUODENOSCOPY (EGD) WITH PROPOFOL N/A 09/09/2020   Procedure: ESOPHAGOGASTRODUODENOSCOPY (EGD) WITH PROPOFOL;  Surgeon: Clarene Essex, MD;  Location: [place];  Service: Endoscopy;  Laterality: N/A;   gi bleed  07/2015   blood given   HOT HEMOSTASIS N/A 12/27/2015   Procedure: HOT HEMOSTASIS (ARGON PLASMA COAGULATION/BICAP);  Surgeon: Clarene Essex, MD;  Location: [facility];  Service: Endoscopy;  Laterality: N/A;   HOT HEMOSTASIS N/A 08/08/2019   Procedure: HOT HEMOSTASIS (ARGON PLASMA COAGULATION/BICAP);  Surgeon: Ronnette Juniper, MD;  Location: [place];  Service: Gastroenterology;  Laterality: N/A;   HOT HEMOSTASIS N/A 09/09/2020   Procedure: HOT HEMOSTASIS (ARGON PLASMA COAGULATION/BICAP);  Surgeon: Clarene Essex, MD;  Location: [place];  Service: Endoscopy;  Laterality: N/A;   ILIAC ARTERY STENT Left 05/04/2013   L CIA-EIA -- Dr. Gwenlyn Found   LEFT HEART CATH AND CORONARY ANGIOGRAPHY N/A 02/11/2019   Procedure: LEFT HEART CATH AND CORONARY ANGIOGRAPHY;  Surgeon: Leonie Man, MD;  Location: [place] CV LAB;;-  Patent RCA stent with minimal ISR after pRCA 50%).  Small caliber RPAV with ~75%.  pLAD 30%, m-dLAD 40%, D2 40%.  Normal LVEDP and LVEF.   LOWER EXTREMITY ANGIOGRAM Bilateral 05/04/2013   Procedure: LOWER EXTREMITY ANGIOGRAM;  Surgeon: Lorretta Harp, MD;  Location: [facility] CATH LAB;  Service: Cardiovascular;  Laterality: Bilateral;   NM MYOVIEW LTD  09/07/2015   EF > 65%. Nonspecific ST changes but no ischemic changes. No ischemia or infarction. LOW RISK   PATCH ANGIOPLASTY  Left 07/28/2019   Procedure: PATCH ANGIOPLASTY USING Rueben Bash BIOLOGIC PATCH;  Surgeon: Waynetta Sandy, MD;  Location: [place];  Service: Vascular;  Laterality: Left;   TONSILLECTOMY  1960's   TRANSTHORACIC ECHOCARDIOGRAM  05/14/2019   Normal EF 60 to 65%.  No R WMA.   Normal RV but mildly elevated pressures.  Relatively normal aortic and mitral valves.   UPPER GI ENDOSCOPY  12/14/2015    Social History   Socioeconomic History   Marital status: Married    Spouse name: Not on file   Number of children: 1   Years of education: 12   Highest education level: High school graduate  Occupational History    Employer: RETIRED  Tobacco Use   Smoking status: Light Smoker    Types: Cigarettes    Last attempt to quit: 05/04/2013    Years since quitting: 8.0   Smokeless tobacco: Never  Tobacco comments:    She initially quit, but has gone back to smoking a couple cigarettes every now and then.  Vaping Use   Vaping Use: Never used  Substance and Sexual Activity   Alcohol use: Yes    Comment: occasionally 3 times a year   Drug use: No   Sexual activity: Not Currently    Partners: Male    Birth control/protection: None  Other Topics Concern   Not on file  Social History Narrative   Married. Has one child. Grandmother for a great-grandmother and 1.   No real exercise.   Occasional alcohol consumption.   Quit smoking inf Feb 2015 -- after L Iliac Stent   Right-handed   Caffeine: 1 cup every morning   Right-handed.   Lives at home with husband.   Social Determinants of Health   Financial Resource Strain: Not on file  Food Insecurity: Not on file  Transportation Needs: Not on file  Physical Activity: Not on file  Stress: Not on file  Social Connections: Not on file  Intimate Partner Violence: Not on file     No Known Allergies   Outpatient Medications Prior to Visit  Medication Sig Dispense Refill   albuterol (VENTOLIN HFA) 108 (90 Base) MCG/ACT inhaler Inhale 2 puffs  into the lungs every 6 (six) hours as needed for wheezing or shortness of breath.     alendronate (FOSAMAX) 70 MG tablet Take 70 mg by mouth once a week.     amLODipine (NORVASC) 5 MG tablet Take 1 tablet (5 mg total) by mouth daily. 90 tablet 3   aspirin EC 81 MG tablet Take 1 tablet (81 mg total) by mouth daily. 30 tablet    atorvastatin (LIPITOR) 80 MG tablet Take 1 tablet (80 mg total) by mouth daily. 90 tablet 3   Cholecalciferol (VITAMIN D3) 1.25 MG (50000 UT) CAPS Take 1 capsule by mouth once a week.     doxycycline (VIBRA-TABS) 100 MG tablet Take 1 tablet (100 mg total) by mouth 2 (two) times daily. 14 tablet 0   ezetimibe (ZETIA) 10 MG tablet Take 1 tablet (10 mg total) by mouth daily. 90 tablet 3   ferrous gluconate (FERGON) 324 MG tablet Take 324 mg by mouth daily with breakfast.     furosemide (LASIX) 20 MG tablet Take 20 mg  One tablet on Monday , Wednesday , Fridays , may take an additional  20 mg ff needed 90 tablet 3   isosorbide mononitrate (IMDUR) 60 MG 24 hr tablet Take 1 tablet (60 mg total) by mouth daily. 90 tablet 3   metoprolol succinate (TOPROL-XL) 100 MG 24 hr tablet Take 2 tablets (200 mg total) by mouth at bedtime. 180 tablet 3   nitroGLYCERIN (NITROSTAT) 0.4 MG SL tablet Place 1 tablet (0.4 mg total) under the tongue every 5 (five) minutes as needed for chest pain. 25 tablet 6   ondansetron (ZOFRAN) 4 MG tablet Take 1 tablet (4 mg total) by mouth every 6 (six) hours as needed for nausea. 20 tablet 0   pantoprazole (PROTONIX) 40 MG tablet Take 1 tablet (40 mg total) by mouth daily. 30 tablet 0   valsartan (DIOVAN) 80 MG tablet Take 1 tablet (80 mg total) by mouth daily. 90 tablet 3   No facility-administered medications prior to visit.    Review of Systems  Constitutional:  Negative for chills, fever, malaise/fatigue and weight loss.  HENT:  Negative for hearing loss, sore throat and tinnitus.   Eyes:  Negative for blurred vision and double vision.  Respiratory:   Negative for cough, hemoptysis, sputum production, shortness of breath, wheezing and stridor.   Cardiovascular:  Negative for chest pain, palpitations, orthopnea, leg swelling and PND.  Gastrointestinal:  Negative for abdominal pain, constipation, diarrhea, heartburn, nausea and vomiting.  Genitourinary:  Negative for dysuria, hematuria and urgency.  Musculoskeletal:  Negative for joint pain and myalgias.  Skin:  Negative for itching and rash.  Neurological:  Negative for dizziness, tingling, weakness and headaches.  Endo/Heme/Allergies:  Negative for environmental allergies. Does not bruise/bleed easily.  Psychiatric/Behavioral:  Negative for depression. The patient is not nervous/anxious and does not have insomnia.   All other systems reviewed and are negative.   Objective:  Physical Exam Vitals reviewed.  Constitutional:      General: She is not in acute distress.    Appearance: She is well-developed.  HENT:     Head: Normocephalic and atraumatic.  Eyes:     General: No scleral icterus.    Conjunctiva/sclera: Conjunctivae normal.     Pupils: Pupils are equal, round, and reactive to light.  Neck:     Vascular: No JVD.     Trachea: No tracheal deviation.  Cardiovascular:     Rate and Rhythm: Normal rate and regular rhythm.     Heart sounds: Normal heart sounds. No murmur heard. Pulmonary:     Effort: Pulmonary effort is normal. No tachypnea, accessory muscle usage or respiratory distress.     Breath sounds: No stridor. No wheezing, rhonchi or rales.  Abdominal:     General: There is no distension.     Palpations: Abdomen is soft.     Tenderness: There is no abdominal tenderness.  Musculoskeletal:        General: No tenderness.     Cervical back: Neck supple.  Lymphadenopathy:     Cervical: No cervical adenopathy.  Skin:    General: Skin is warm and dry.     Capillary Refill: Capillary refill takes less than 2 seconds.     Findings: No rash.  Neurological:     Mental  Status: She is alert and oriented to person, place, and time.  Psychiatric:        Behavior: Behavior normal.     Vitals:   05/19/21 1551  BP: (!) 112/50  Pulse: 65  Temp: 97.7 F (36.5 C)  TempSrc: Oral  SpO2: 94%  Weight: 130 lb (59 kg)  Height: 4\' 11"  (1.499 m)   94% on RA BMI Readings from Last 3 Encounters:  05/19/21 26.26 kg/m  01/20/21 24.64 kg/m  09/09/20 27.87 kg/m   Wt Readings from Last 3 Encounters:  05/19/21 130 lb (59 kg)  01/20/21 122 lb (55.3 kg)  09/09/20 138 lb 0.1 oz (62.6 kg)     CBC    Component Value Date/Time   WBC 6.0 09/09/2020 0015   RBC 3.73 (L) 09/09/2020 0015   HGB 11.0 (L) 09/09/2020 0015   HGB 12.0 02/05/2019 0836   HCT 33.9 (L) 09/09/2020 0015   HCT 38.5 02/05/2019 0836   PLT 134 (L) 09/09/2020 0015   PLT 212 02/05/2019 0836   MCV 90.9 09/09/2020 0015   MCV 81 02/05/2019 0836   MCH 29.5 09/09/2020 0015   MCHC 32.4 09/09/2020 0015   RDW 17.2 (H) 09/09/2020 0015   RDW 17.3 (H) 02/05/2019 0836   LYMPHSABS 1.7 08/06/2019 1652   MONOABS 0.9 08/06/2019 1652   EOSABS 0.1 08/06/2019 1652   BASOSABS 0.0 08/06/2019  1652     Chest Imaging: Nuclear medicine pet imaging February 3154: Hypermetabolic nodules within the right and left lung concerning for multifocal carcinoma. The patient's images have been independently reviewed by me.    Pulmonary Functions Testing Results: No flowsheet data found.  FeNO:   Pathology:   Echocardiogram:   Heart Catheterization:     Assessment & Plan:     ICD-10-CM   1. Lung nodules  R91.8 Ambulatory referral to Pulmonology    CT Super D Chest Wo Contrast    Procedural/ Surgical Case Request: ROBOTIC ASSISTED NAVIGATIONAL BRONCHOSCOPY    2. Abnormal PET scan, lung  R94.2     3. Abnormal CT lung screening  R91.8     4. Tobacco abuse  Z72.0       Discussion:  This is a 76 year old female recent abnormal lung cancer screening CT with bilateral pulmonary nodules both of which are  hypermetabolic on PET scan concerning for multifocal lung malignancy.  Plan: Today in the office we discussed the risk benefits and alternatives of proceeding with navigational bronchoscopy and tissue sampling. We talked about the risk of bleeding and pneumothorax. Patient is not on any blood thinners or antiplatelets. We reviewed her CT imaging as well as nuclear medicine pet imaging today in the office. I explained that we would attempt to biopsy both of the small lesions in the upper lobes as well as one of the smaller lesions identified on pet imaging. Patient is agreeable to this plan. We appreciate the PCC's help with scheduling.  Bronchoscopy to be scheduled on June 06, 2021.    Current Outpatient Medications:    albuterol (VENTOLIN HFA) 108 (90 Base) MCG/ACT inhaler, Inhale 2 puffs into the lungs every 6 (six) hours as needed for wheezing or shortness of breath., Disp: , Rfl:    alendronate (FOSAMAX) 70 MG tablet, Take 70 mg by mouth once a week., Disp: , Rfl:    amLODipine (NORVASC) 5 MG tablet, Take 1 tablet (5 mg total) by mouth daily., Disp: 90 tablet, Rfl: 3   aspirin EC 81 MG tablet, Take 1 tablet (81 mg total) by mouth daily., Disp: 30 tablet, Rfl:    atorvastatin (LIPITOR) 80 MG tablet, Take 1 tablet (80 mg total) by mouth daily., Disp: 90 tablet, Rfl: 3   Cholecalciferol (VITAMIN D3) 1.25 MG (50000 UT) CAPS, Take 1 capsule by mouth once a week., Disp: , Rfl:    doxycycline (VIBRA-TABS) 100 MG tablet, Take 1 tablet (100 mg total) by mouth 2 (two) times daily., Disp: 14 tablet, Rfl: 0   ezetimibe (ZETIA) 10 MG tablet, Take 1 tablet (10 mg total) by mouth daily., Disp: 90 tablet, Rfl: 3   ferrous gluconate (FERGON) 324 MG tablet, Take 324 mg by mouth daily with breakfast., Disp: , Rfl:    furosemide (LASIX) 20 MG tablet, Take 20 mg  One tablet on Monday , Wednesday , Fridays , may take an additional  20 mg ff needed, Disp: 90 tablet, Rfl: 3   isosorbide mononitrate (IMDUR) 60  MG 24 hr tablet, Take 1 tablet (60 mg total) by mouth daily., Disp: 90 tablet, Rfl: 3   metoprolol succinate (TOPROL-XL) 100 MG 24 hr tablet, Take 2 tablets (200 mg total) by mouth at bedtime., Disp: 180 tablet, Rfl: 3   nitroGLYCERIN (NITROSTAT) 0.4 MG SL tablet, Place 1 tablet (0.4 mg total) under the tongue every 5 (five) minutes as needed for chest pain., Disp: 25 tablet, Rfl: 6   ondansetron (ZOFRAN)  4 MG tablet, Take 1 tablet (4 mg total) by mouth every 6 (six) hours as needed for nausea., Disp: 20 tablet, Rfl: 0   pantoprazole (PROTONIX) 40 MG tablet, Take 1 tablet (40 mg total) by mouth daily., Disp: 30 tablet, Rfl: 0   valsartan (DIOVAN) 80 MG tablet, Take 1 tablet (80 mg total) by mouth daily., Disp: 90 tablet, Rfl: 3  I spent 63 minutes dedicated to the care of this patient on the date of this encounter to include pre-visit review of records, face-to-face time with the patient discussing conditions above, post visit ordering of testing, clinical documentation with the electronic health record, making appropriate referrals as documented, and communicating necessary findings to members of the patients care team.   Garner Nash, DO [facility] 05/19/2021 4:29 PM

## 2021-05-22 DIAGNOSIS — R918 Other nonspecific abnormal finding of lung field: Secondary | ICD-10-CM | POA: Insufficient documentation

## 2021-05-23 ENCOUNTER — Encounter: Payer: Self-pay | Admitting: Pulmonary Disease

## 2021-05-31 DIAGNOSIS — R918 Other nonspecific abnormal finding of lung field: Secondary | ICD-10-CM

## 2021-05-31 HISTORY — DX: Other nonspecific abnormal finding of lung field: R91.8

## 2021-06-02 ENCOUNTER — Encounter (HOSPITAL_COMMUNITY): Payer: Self-pay | Admitting: Pulmonary Disease

## 2021-06-02 ENCOUNTER — Other Ambulatory Visit: Payer: Self-pay | Admitting: Pulmonary Disease

## 2021-06-02 LAB — SARS CORONAVIRUS 2 (TAT 6-24 HRS): SARS Coronavirus 2: NEGATIVE

## 2021-06-02 NOTE — Progress Notes (Signed)
DUE TO COVID-19 ONLY ONE VISITOR IS ALLOWED TO COME WITH YOU AND STAY IN THE WAITING ROOM ONLY DURING PRE OP AND PROCEDURE DAY OF SURGERY.  ? ?PCP - Marilynne Drivers, PA-C   ?Cardiologist - Dr [name] ?Hematology & Oncology - Dr Hart Robinsons ? ?CT Chest x-ray - 04/12/21 ?EKG - 09/07/20 ?Stress Test - 09/07/15 ?ECHO - 05/14/19 ?Cardiac Cath - 02/11/19 ? ?ICD Pacemaker/Loop - n/a ? ?Sleep Study -  n/a ?CPAP - none ? ?Aspirin Instructions: Follow your surgeon's instructions on when to stop aspirin prior to surgery,  If no instructions were given by your surgeon then you will need to call the office for those instructions. ? ?Anesthesia review: Yes ? ?STOP now taking any Aspirin (unless otherwise instructed by your surgeon), Aleve, Naproxen, Ibuprofen, Motrin, Advil, Goody's, BC's, all herbal medications, fish oil, and all vitamins.  ? ?Coronavirus Screening ?Covid test on 06/02/21 was negative.   ?Do you have any of the following symptoms:  ?Cough yes/no: No ?Fever (>100.61F)  yes/no: No ?Runny nose yes/no: No ?Sore throat yes/no: No ?Difficulty breathing/shortness of breath  yes/no: No ? ?Have you traveled in the last 14 days and where? yes/no: No ? ?Patient verbalized understanding of instructions that were given via phone. ?

## 2021-06-05 ENCOUNTER — Ambulatory Visit (INDEPENDENT_AMBULATORY_CARE_PROVIDER_SITE_OTHER)
Admission: RE | Admit: 2021-06-05 | Discharge: 2021-06-05 | Disposition: A | Discharge status: Home / Self Care | Payer: Medicare Other | Source: Ambulatory Visit | Attending: Pulmonary Disease | Admitting: Pulmonary Disease

## 2021-06-05 ENCOUNTER — Encounter (HOSPITAL_COMMUNITY): Payer: Self-pay | Admitting: Pulmonary Disease

## 2021-06-05 ENCOUNTER — Other Ambulatory Visit: Payer: Self-pay

## 2021-06-05 ENCOUNTER — Other Ambulatory Visit: Payer: Medicare Other

## 2021-06-05 DIAGNOSIS — I700 Atherosclerosis of aorta: Secondary | ICD-10-CM | POA: Diagnosis not present

## 2021-06-05 DIAGNOSIS — R918 Other nonspecific abnormal finding of lung field: Secondary | ICD-10-CM | POA: Diagnosis not present

## 2021-06-05 DIAGNOSIS — R911 Solitary pulmonary nodule: Secondary | ICD-10-CM | POA: Diagnosis not present

## 2021-06-05 DIAGNOSIS — J479 Bronchiectasis, uncomplicated: Secondary | ICD-10-CM | POA: Diagnosis not present

## 2021-06-05 NOTE — Progress Notes (Addendum)
Anesthesia Chart Review: ?Same day workup ? ?Follows with cardiology for history of CAD s/p DES to RCA in 2014 (cath 02/2019 showed patent stent with diffuse mild to moderate disease), chronic angina, HTN, PAD s/p PTCA of the left common and external iliac artery, bilateral carotid artery stenosis s/p bilateral CEA 2021 (patent by duplex 05/2020).  Last seen by Dr. Ellyn Hack 01/20/2021 via virtual visit, noted to be doing relatively well, angina well treated on imdur, amlodipine, metoprolol.  70-monthfollow-up recommended. ? ?Former smoker, quit 2015.  Lung cancer screening CT 04/12/2021 suspicious for multiple nodules in both lobes upper and right lower.  Also had associated emphysema.  Patient was referred for nuclear medicine PET scan was completed on 05/16/2018.  Nuclear medicine PET imaging revealed 3 pulmonary nodules all with elevated hypermetabolism left upper lobe lesion as well as a right upper lobe lesion both with an SUV of greater than 4 and a smaller left upper lobe nodule with an SUV of 2.6.  This is concerning for multifocal malignancy.  Navigational bronchoscopy was recommended. ? ?History of CKD 2.  Review of available labs shows baseline creatinine ~1.4. ? ?Patient will need day of surgery labs and evaluation. ? ?EKG 09/12/2020: NSR with sinus arrhythmia.  Rate 79. ? ?CT Super D Chest 06/05/21: ?IMPRESSION: ?1. Bilateral pulmonary nodules are unchanged from 04/12/2021 and ?were hypermetabolic on 030/94/0768 Together with upper and midlung ?zone predominant peribronchovascular ground-glass/nodularity, ?bronchiectasis and mucoid impaction, findings may represent ?mycobacterium avium complex. Malignancy cannot be excluded. ?2. Enlarging mediastinal adenopathy may be reactive. ?3. Follow-up CT chest with contrast in 3 months is recommended for ?the above findings, as clinically indicated. ?4. Aortic atherosclerosis (ICD10-I70.0). Coronary artery ?calcification. ?5. Enlarged pulmonic trunk, indicative of  pulmonary arterial ?hypertension. ? ?Carotid duplex 06/20/2020: ?Summary:  ?Right Carotid: There is no evidence of stenosis in the right ICA. The ECA appears >50% stenosed.  ? ?Left Carotid: There is no evidence of stenosis in the left ICA.  ? ?TTE 05/14/2019: ? 1. Left ventricular ejection fraction, by estimation, is 60 to 65%. The  ?left ventricle has normal function. The left ventrical has no regional  ?wall motion abnormalities. Left ventricular diastolic parameters are  ?consistent with Grade II diastolic  ?dysfunction (pseudonormalization).  ? 2. Right ventricular systolic function is normal. The right ventricular  ?size is normal. There is moderately elevated pulmonary artery systolic  ?pressure.  ? 3. The mitral valve is normal in structure and function. trivial mitral  ?valve regurgitation. No evidence of mitral stenosis.  ? 4. The aortic valve is normal in structure and function. Aortic valve  ?regurgitation is trivial . No aortic stenosis is present.  ? ?Cath 02/11/2019: ?SUMMARY ?Patent RCA stent with minimal ISR ?Moderate pRCA lesion & 75% ostial RPAV (very small caliber vessel) - could be potential culprits - recommend medical management. ?Normal LVEF & EDP. ?  ?RECOMMENDATIONS ?Discharge home after bedrest -- she should have f/u scheduled to discuss medical management ?Consider Pulmonary Cause for Dyspnea on Exertion -- Defer Pulmonary evaluation to PCP ? ? ? ?JKaroline Caldwell PA-C ?[facility] ?Phone ([phone]?06/05/2021 1:06 PM ? ?

## 2021-06-05 NOTE — Anesthesia Preprocedure Evaluation (Addendum)
Anesthesia Evaluation  ?Patient identified by MRN, date of birth, ID band ?Patient awake ? ? ? ?Reviewed: ?Allergy & Precautions, NPO status , Patient's Chart, lab work & pertinent test results ? ?Airway ?Mallampati: I ? ?TM Distance: >3 FB ?Neck ROM: Full ? ? ? Dental ? ?(+) Missing,  ?  ?Pulmonary ?asthma , Current Smoker and Patient abstained from smoking.,  ?  ?breath sounds clear to auscultation ? ? ? ? ? ? Cardiovascular ?hypertension, Pt. on medications and Pt. on home beta blockers ?+ CAD, + Past MI, + Cardiac Stents, + Peripheral Vascular Disease and +CHF  ? ?Rhythm:Regular Rate:Normal ? ? ?  ?Neuro/Psych ?CVA   ? GI/Hepatic ?Neg liver ROS, GERD  Medicated,  ?Endo/Other  ?negative endocrine ROS ? Renal/GU ?CRFRenal disease  ? ?  ?Musculoskeletal ?negative musculoskeletal ROS ?(+)  ? Abdominal ?Normal abdominal exam  (+)   ?Peds ? Hematology ?  ?Anesthesia Other Findings ? ? Reproductive/Obstetrics ? ?  ? ? ? ? ? ? ? ? ? ? ? ? ? ?  ?  ? ? ? ? ? ? ?Anesthesia Physical ?Anesthesia Plan ? ?ASA: 3 ? ?Anesthesia Plan: General  ? ?Post-op Pain Management:   ? ?Induction: Intravenous ? ?PONV Risk Score and Plan: 3 and Ondansetron, Dexamethasone and Treatment may vary due to age or medical condition ? ?Airway Management Planned: Oral ETT ? ?Additional Equipment: None ? ?Intra-op Plan:  ? ?Post-operative Plan: Extubation in OR ? ?Informed Consent: I have reviewed the patients History and Physical, chart, labs and discussed the procedure including the risks, benefits and alternatives for the proposed anesthesia with the patient or authorized representative who has indicated his/her understanding and acceptance.  ? ? ? ?Dental advisory given ? ?Plan Discussed with: CRNA ? ?Anesthesia Plan Comments: (PAT note by Karoline Caldwell, PA-C: ?Follows with cardiology for history of CAD s/p DES to RCA in 2014 (cath 02/2019 showed patent stent with diffuse mild to moderate disease), chronic angina,  HTN, PAD s/p PTCA of the left common and external iliac artery, bilateral carotid artery stenosis s/p bilateral CEA 2021 (patent by duplex 05/2020).  Last seen by Dr. Ellyn Hack 01/20/2021 via virtual visit, noted to be doing relatively well, angina well treated on imdur, amlodipine, metoprolol.  22-monthfollow-up recommended. ? ?Former smoker, quit 2015.  Lung cancer screening CT 04/12/2021 suspicious for multiple nodules in both lobes upper and right lower.  Also had associated emphysema.  Patient was referred for nuclear medicine PET scan was completed on 05/16/2018.  Nuclear medicine PET imaging revealed 3 pulmonary nodules all with elevated hypermetabolism left upper lobe lesion as well as a right upper lobe lesion both with an SUV of greater than 4 and a smaller left upper lobe nodule with an SUV of 2.6.  This is concerning for multifocal malignancy.  Navigational bronchoscopy was recommended. ? ?History of CKD 2.  Review of available labs shows baseline creatinine ~1.4. ? ?Patient will need day of surgery labs and evaluation. ? ?EKG 09/12/2020: NSR with sinus arrhythmia.  Rate 79. ? ?CT Super D Chest 06/05/21: ?IMPRESSION: ?1. Bilateral pulmonary nodules are unchanged from 04/12/2021 and ?were hypermetabolic on 083/41/9622 Together with upper and midlung ?zone predominant peribronchovascular ground-glass/nodularity, ?bronchiectasis and mucoid impaction, findings may represent ?mycobacterium avium complex. Malignancy cannot be excluded. ?2. Enlarging mediastinal adenopathy may be reactive. ?3. Follow-up CT chest with contrast in 3 months is recommended for ?the above findings, as clinically indicated. ?4. Aortic atherosclerosis (ICD10-I70.0). Coronary artery ?calcification. ?5. Enlarged pulmonic trunk,  indicative of pulmonary arterial ?hypertension. ? ?Carotid duplex 06/20/2020: ?Summary:  ?Right Carotid: There is no evidence of stenosis in the right ICA. The ECA appears >50% stenosed.  ? ?Left Carotid: There is no  evidence of stenosis in the left ICA.  ? ?TTE 05/14/2019: ??1. Left ventricular ejection fraction, by estimation, is 60 to 65%. The  ?left ventricle has normal function. The left ventrical has no regional  ?wall motion abnormalities. Left ventricular diastolic parameters are  ?consistent with Grade II diastolic  ?dysfunction (pseudonormalization).  ??2. Right ventricular systolic function is normal. The right ventricular  ?size is normal. There is moderately elevated pulmonary artery systolic  ?pressure.  ??3. The mitral valve is normal in structure and function. trivial mitral  ?valve regurgitation. No evidence of mitral stenosis.  ??4. The aortic valve is normal in structure and function. Aortic valve  ?regurgitation is trivial . No aortic stenosis is present.  ? ?Cath 02/11/2019: ?SUMMARY ?? Patent RCA stent with minimal ISR ?? Moderate pRCA lesion & 75% ostial RPAV (very small caliber vessel) - could be potential culprits - recommend medical management. ?? Normal LVEF & EDP. ?? ?RECOMMENDATIONS ?? Discharge home after bedrest -- she should have f/u scheduled to discuss medical management ?? Consider Pulmonary Cause for Dyspnea on Exertion -- Defer Pulmonary evaluation to PCP ?)  ? ? ? ?Anesthesia Quick Evaluation ? ?

## 2021-06-06 ENCOUNTER — Encounter (HOSPITAL_COMMUNITY)
Admission: RE | Disposition: A | Discharge status: Home / Self Care | Payer: Self-pay | Source: Ambulatory Visit | Attending: Pulmonary Disease

## 2021-06-06 ENCOUNTER — Ambulatory Visit (HOSPITAL_COMMUNITY): Payer: Medicare Other

## 2021-06-06 ENCOUNTER — Ambulatory Visit (HOSPITAL_COMMUNITY)
Admission: RE | Admit: 2021-06-06 | Discharge: 2021-06-06 | Disposition: A | Discharge status: Home / Self Care | Payer: Medicare Other | Source: Ambulatory Visit | Attending: Pulmonary Disease | Admitting: Pulmonary Disease

## 2021-06-06 ENCOUNTER — Other Ambulatory Visit: Payer: Self-pay

## 2021-06-06 ENCOUNTER — Ambulatory Visit (HOSPITAL_COMMUNITY): Payer: Medicare Other | Admitting: Physician Assistant

## 2021-06-06 ENCOUNTER — Ambulatory Visit (HOSPITAL_BASED_OUTPATIENT_CLINIC_OR_DEPARTMENT_OTHER): Payer: Medicare Other | Admitting: Physician Assistant

## 2021-06-06 ENCOUNTER — Encounter (HOSPITAL_COMMUNITY): Payer: Self-pay | Admitting: Pulmonary Disease

## 2021-06-06 DIAGNOSIS — I2510 Atherosclerotic heart disease of native coronary artery without angina pectoris: Secondary | ICD-10-CM | POA: Diagnosis not present

## 2021-06-06 DIAGNOSIS — Z955 Presence of coronary angioplasty implant and graft: Secondary | ICD-10-CM | POA: Insufficient documentation

## 2021-06-06 DIAGNOSIS — I509 Heart failure, unspecified: Secondary | ICD-10-CM

## 2021-06-06 DIAGNOSIS — J45909 Unspecified asthma, uncomplicated: Secondary | ICD-10-CM | POA: Insufficient documentation

## 2021-06-06 DIAGNOSIS — I130 Hypertensive heart and chronic kidney disease with heart failure and stage 1 through stage 4 chronic kidney disease, or unspecified chronic kidney disease: Secondary | ICD-10-CM | POA: Insufficient documentation

## 2021-06-06 DIAGNOSIS — I252 Old myocardial infarction: Secondary | ICD-10-CM | POA: Insufficient documentation

## 2021-06-06 DIAGNOSIS — Z7982 Long term (current) use of aspirin: Secondary | ICD-10-CM | POA: Insufficient documentation

## 2021-06-06 DIAGNOSIS — K219 Gastro-esophageal reflux disease without esophagitis: Secondary | ICD-10-CM | POA: Diagnosis not present

## 2021-06-06 DIAGNOSIS — R918 Other nonspecific abnormal finding of lung field: Secondary | ICD-10-CM

## 2021-06-06 DIAGNOSIS — Z8673 Personal history of transient ischemic attack (TIA), and cerebral infarction without residual deficits: Secondary | ICD-10-CM | POA: Insufficient documentation

## 2021-06-06 DIAGNOSIS — N189 Chronic kidney disease, unspecified: Secondary | ICD-10-CM

## 2021-06-06 DIAGNOSIS — Z87891 Personal history of nicotine dependence: Secondary | ICD-10-CM | POA: Insufficient documentation

## 2021-06-06 DIAGNOSIS — I517 Cardiomegaly: Secondary | ICD-10-CM | POA: Diagnosis not present

## 2021-06-06 DIAGNOSIS — I5032 Chronic diastolic (congestive) heart failure: Secondary | ICD-10-CM | POA: Insufficient documentation

## 2021-06-06 DIAGNOSIS — Z9889 Other specified postprocedural states: Secondary | ICD-10-CM

## 2021-06-06 DIAGNOSIS — Z419 Encounter for procedure for purposes other than remedying health state, unspecified: Secondary | ICD-10-CM

## 2021-06-06 HISTORY — PX: FIDUCIAL MARKER PLACEMENT: SHX6858

## 2021-06-06 HISTORY — PX: VIDEO BRONCHOSCOPY WITH RADIAL ENDOBRONCHIAL ULTRASOUND: SHX6849

## 2021-06-06 HISTORY — PX: BRONCHIAL BIOPSY: SHX5109

## 2021-06-06 HISTORY — PX: BRONCHIAL BRUSHINGS: SHX5108

## 2021-06-06 HISTORY — PX: BRONCHIAL NEEDLE ASPIRATION BIOPSY: SHX5106

## 2021-06-06 LAB — CBC
HCT: 40.6 % (ref 36.0–46.0)
Hemoglobin: 12.8 g/dL (ref 12.0–15.0)
MCH: 28.2 pg (ref 26.0–34.0)
MCHC: 31.5 g/dL (ref 30.0–36.0)
MCV: 89.4 fL (ref 80.0–100.0)
Platelets: 165 10*3/uL (ref 150–400)
RBC: 4.54 MIL/uL (ref 3.87–5.11)
RDW: 17.2 % — ABNORMAL HIGH (ref 11.5–15.5)
WBC: 6.4 10*3/uL (ref 4.0–10.5)
nRBC: 0 % (ref 0.0–0.2)

## 2021-06-06 LAB — BASIC METABOLIC PANEL
Anion gap: 9 (ref 5–15)
BUN: 17 mg/dL (ref 8–23)
CO2: 24 mmol/L (ref 22–32)
Calcium: 9.2 mg/dL (ref 8.9–10.3)
Chloride: 107 mmol/L (ref 98–111)
Creatinine, Ser: 1.14 mg/dL — ABNORMAL HIGH (ref 0.44–1.00)
GFR, Estimated: 51 mL/min — ABNORMAL LOW (ref >60)
Glucose, Bld: 86 mg/dL (ref 70–99)
Potassium: 5 mmol/L (ref 3.5–5.1)
Sodium: 140 mmol/L (ref 135–145)

## 2021-06-06 SURGERY — BRONCHOSCOPY, WITH BIOPSY USING ELECTROMAGNETIC NAVIGATION
Anesthesia: General | Laterality: Bilateral

## 2021-06-06 MED ORDER — CHLORHEXIDINE GLUCONATE 0.12 % MT SOLN: 15.0000 mL | Freq: Once | OROMUCOSAL | Status: DC

## 2021-06-06 MED ORDER — FENTANYL CITRATE (PF) 250 MCG/5ML IJ SOLN
INTRAMUSCULAR | Status: DC | PRN
  Administered 2021-06-06: 75 ug via INTRAVENOUS

## 2021-06-06 MED ORDER — LIDOCAINE 2% (20 MG/ML) 5 ML SYRINGE
INTRAMUSCULAR | Status: DC | PRN
  Administered 2021-06-06: 60 mg via INTRAVENOUS

## 2021-06-06 MED ORDER — PHENYLEPHRINE 40 MCG/ML (10ML) SYRINGE FOR IV PUSH (FOR BLOOD PRESSURE SUPPORT)
PREFILLED_SYRINGE | INTRAVENOUS | Status: DC | PRN
  Administered 2021-06-06 (×2): 80 ug via INTRAVENOUS

## 2021-06-06 MED ORDER — DEXAMETHASONE SODIUM PHOSPHATE 10 MG/ML IJ SOLN
INTRAMUSCULAR | Status: DC | PRN
  Administered 2021-06-06: 5 mg via INTRAVENOUS

## 2021-06-06 MED ORDER — SUGAMMADEX SODIUM 200 MG/2ML IV SOLN
INTRAVENOUS | Status: DC | PRN
  Administered 2021-06-06: 200 mg via INTRAVENOUS

## 2021-06-06 MED ORDER — LACTATED RINGERS IV SOLN: INTRAVENOUS | Status: DC

## 2021-06-06 MED ORDER — PHENYLEPHRINE HCL-NACL 20-0.9 MG/250ML-% IV SOLN
INTRAVENOUS | Status: DC | PRN
  Administered 2021-06-06: 25 ug/min via INTRAVENOUS

## 2021-06-06 MED ORDER — PROPOFOL 10 MG/ML IV BOLUS
INTRAVENOUS | Status: DC | PRN
  Administered 2021-06-06: 100 mg via INTRAVENOUS

## 2021-06-06 MED ORDER — ROCURONIUM BROMIDE 10 MG/ML (PF) SYRINGE
PREFILLED_SYRINGE | INTRAVENOUS | Status: DC | PRN
Start: 2021-06-06 — End: 2021-06-06
  Administered 2021-06-06: 50 mg via INTRAVENOUS

## 2021-06-06 MED ORDER — ONDANSETRON HCL 4 MG/2ML IJ SOLN
INTRAMUSCULAR | Status: DC | PRN
  Administered 2021-06-06: 4 mg via INTRAVENOUS

## 2021-06-06 MED ORDER — CHLORHEXIDINE GLUCONATE 0.12 % MT SOLN
OROMUCOSAL | Status: AC
  Administered 2021-06-06: 15 mL
  Filled 2021-06-06: qty 15

## 2021-06-06 SURGICAL SUPPLY — 1 items: Superlock fiducial marker ×2 IMPLANT

## 2021-06-06 NOTE — Transfer of Care (Signed)
Immediate Anesthesia Transfer of Care Note ? ?Patient: Kayla Mcclure ? ?Procedure(s) Performed: ROBOTIC ASSISTED NAVIGATIONAL BRONCHOSCOPY (Bilateral) ?BRONCHIAL BRUSHINGS ?BRONCHIAL BIOPSIES ?BRONCHIAL NEEDLE ASPIRATION BIOPSIES ?RADIAL ENDOBRONCHIAL ULTRASOUND ?FIDUCIAL MARKER PLACEMENT ? ?Patient Location: Endoscopy Unit ? ?Anesthesia Type:General ? ?Level of Consciousness: drowsy ? ?Airway & Oxygen Therapy: Patient Spontanous Breathing and Patient connected to nasal cannula oxygen ? ?Post-op Assessment: Report given to RN and Post -op Vital signs reviewed and stable ? ?Post vital signs: Reviewed and stable ? ?Last Vitals:  ?Vitals Value Taken Time  ?BP 167/62 06/06/21 1617  ?Temp    ?Pulse 63 06/06/21 1617  ?Resp 22 06/06/21 1617  ?SpO2 96 % 06/06/21 1617  ?Vitals shown include unvalidated device data. ? ?Last Pain:  ?Vitals:  ? 06/06/21 1324  ?TempSrc:   ?PainSc: 0-No pain  ?   ? ?  ? ?Complications: No notable events documented. ?

## 2021-06-06 NOTE — Anesthesia Postprocedure Evaluation (Signed)
Anesthesia Post Note ? ?Patient: Kayla Mcclure ? ?Procedure(s) Performed: ROBOTIC ASSISTED NAVIGATIONAL BRONCHOSCOPY (Bilateral) ?BRONCHIAL BRUSHINGS ?BRONCHIAL BIOPSIES ?BRONCHIAL NEEDLE ASPIRATION BIOPSIES ?RADIAL ENDOBRONCHIAL ULTRASOUND ?FIDUCIAL MARKER PLACEMENT ? ?  ? ?Patient location during evaluation: PACU ?Anesthesia Type: General ?Level of consciousness: awake and alert ?Pain management: pain level controlled ?Vital Signs Assessment: post-procedure vital signs reviewed and stable ?Respiratory status: spontaneous breathing, nonlabored ventilation, respiratory function stable and patient connected to nasal cannula oxygen ?Cardiovascular status: blood pressure returned to baseline and stable ?Postop Assessment: no apparent nausea or vomiting ?Anesthetic complications: no ? ? ?No notable events documented. ? ?Last Vitals:  ?Vitals:  ? 06/06/21 1700 06/06/21 1715  ?BP: (!) 132/52 (!) 132/57  ?Pulse: 74 72  ?Resp: 20 11  ?Temp:  36.5 ?C  ?SpO2: 95% 95%  ?  ?Last Pain:  ?Vitals:  ? 06/06/21 1715  ?TempSrc:   ?PainSc: 0-No pain  ? ? ?  ?  ?  ?  ?  ?  ? ?Effie Berkshire ? ? ? ? ?

## 2021-06-06 NOTE — Progress Notes (Signed)
Patient brought to pacu, cxr done, Dr Valeta Harms in to see pt ?

## 2021-06-06 NOTE — Discharge Instructions (Signed)
Flexible Bronchoscopy, Care After ?This sheet gives you information about how to care for yourself after your test. Your doctor may also give you more specific instructions. If you have problems or questions, contact your doctor. ?Follow these instructions at home: ?Eating and drinking ?Do not eat or drink anything (not even water) for 2 hours after your test, or until your numbing medicine (local anesthetic) wears off. ?When your numbness is gone and your cough and gag reflexes have come back, you may: ?Eat only soft foods. ?Slowly drink liquids. ?The day after the test, go back to your normal diet. ?Driving ?Do not drive for 24 hours if you were given a medicine to help you relax (sedative). ?Do not drive or use heavy machinery while taking prescription pain medicine. ?General instructions ? ?Take over-the-counter and prescription medicines only as told by your doctor. ?Return to your normal activities as told. Ask what activities are safe for you. ?Do not use any products that have nicotine or tobacco in them. This includes cigarettes and e-cigarettes. If you need help quitting, ask your doctor. ?Keep all follow-up visits as told by your doctor. This is important. It is very important if you had a tissue sample (biopsy) taken. ?Get help right away if: ?You have shortness of breath that gets worse. ?You get light-headed. ?You feel like you are going to pass out (faint). ?You have chest pain. ?You cough up: ?More than a little blood. ?More blood than before. ?Summary ?Do not eat or drink anything (not even water) for 2 hours after your test, or until your numbing medicine wears off. ?Do not use cigarettes. Do not use e-cigarettes. ?Get help right away if you have chest pain. ? ?This information is not intended to replace advice given to you by your health care provider. Make sure you discuss any questions you have with your health care provider. ?Document Released: 01/14/2009 Document Revised: 03/01/2017 Document  Reviewed: 04/06/2016 ?Elsevier Patient Education ? [place]. ? ?

## 2021-06-06 NOTE — Interval H&P Note (Signed)
History and Physical Interval Note: ? ?06/06/2021 ?2:47 PM ? ?Kayla Mcclure  has presented today for surgery, with the diagnosis of bilateral lung nodules.  The various methods of treatment have been discussed with the patient and family. After consideration of risks, benefits and other options for treatment, the patient has consented to  Procedure(s) with comments: ?ROBOTIC ASSISTED NAVIGATIONAL BRONCHOSCOPY (Bilateral) - ION w/ CIOS as a surgical intervention.  The patient's history has been reviewed, patient examined, no change in status, stable for surgery.  I have reviewed the patient's chart and labs.  Questions were answered to the patient's satisfaction.   ? ? ?Octavio Graves Loyce Klasen ? ? ?

## 2021-06-06 NOTE — Op Note (Addendum)
Video Bronchoscopy with Robotic Assisted Bronchoscopic Navigation  ? ?Date of Operation: 06/06/2021  ? ?Pre-op Diagnosis: Multiple pulmonary nodules ? ?Post-op Diagnosis: Multiple pulmonary nodules ? ?Surgeon: Garner Nash, DO ? ?Assistants: None  ? ?Anesthesia: General endotracheal anesthesia ? ?Operation: Flexible video fiberoptic bronchoscopy with robotic assistance and biopsies. ? ?Estimated Blood Loss: Minimal ? ?Complications: None ? ?Indications and History: ?Kayla Mcclure is a 76 y.o. female with history of multiple pulmonary nodules. The risks, benefits, complications, treatment options and expected outcomes were discussed with the patient.  The possibilities of pneumothorax, pneumonia, reaction to medication, pulmonary aspiration, perforation of a viscus, bleeding, failure to diagnose a condition and creating a complication requiring transfusion or operation were discussed with the patient who freely signed the consent.   ? ?Description of Procedure: ?The patient was seen in the Preoperative Area, was examined and was deemed appropriate to proceed.  The patient was taken to [facility] endoscopy room 3, identified as Lezly Sherran Needs and the procedure verified as Flexible Video Fiberoptic Bronchoscopy.  A Time Out was held and the above information confirmed.  ? ?Prior to the date of the procedure a high-resolution CT scan of the chest was performed. Utilizing ION software program a virtual tracheobronchial tree was generated to allow the creation of distinct navigation pathways to the patient's parenchymal abnormalities. After being taken to the operating room general anesthesia was initiated and the patient  was orally intubated. The video fiberoptic bronchoscope was introduced via the endotracheal tube and a general inspection was performed which showed normal right and left lung anatomy, aspiration of the bilateral mainstems was completed to remove any remaining secretions. Robotic catheter inserted  into patient's endotracheal tube.  ? ?Target #1 left upper lobe pulmonary nodule: ?The distinct navigation pathways prepared prior to this procedure were then utilized to navigate to patient's lesion identified on CT scan. The robotic catheter was secured into place and the vision probe was withdrawn.  Lesion location was approximated using fluoroscopy, three-dimensional cone beam CT imaging and radial endobronchial ultrasound for peripheral targeting. Under fluoroscopic guidance transbronchial needle brushings, transbronchial needle biopsies, and transbronchial forceps biopsies were performed to be sent for cytology and pathology.  Additional transbronchial biopsies sent for tissue culture.  Following tissue sampling a single fiducial was placed in proximity of the lesion lesion with fiducial catheter wire and delivery kit. ? ?Target #2 right lower lobe medial: ?The distinct navigation pathways prepared prior to this procedure were then utilized to navigate to patient's lesion identified on CT scan. The robotic catheter was secured into place and the vision probe was withdrawn.  Lesion location was approximated using fluoroscopy, three-dimensional cone beam CT imaging and radial endobronchial ultrasound for peripheral targeting. Under fluoroscopic guidance transbronchial needle brushings, transbronchial needle biopsies, and transbronchial forceps biopsies were performed to be sent for cytology and pathology.  Following tissue sampling a single fiducial was placed in the proximity of the lesion with fiducial catheter wire and delivery kit. ? ?At the end of the procedure a general airway inspection was performed and there was no evidence of active bleeding. The bronchoscope was removed.  The patient tolerated the procedure well. There was no significant blood loss and there were no obvious complications. A post-procedural chest x-ray is pending. ? ?Samples Target #1: ?1. Transbronchial needle brushings from left  upper lobe ?2. Transbronchial Wang needle biopsies from left upper lobe ?3. Transbronchial forceps biopsies from left upper lobe ? ?Samples Target #2: ?1. Transbronchial needle brushings from right lower  lobe ?2. Transbronchial Wang needle biopsies from right lower lobe ?3. Transbronchial forceps biopsies from right lower lobe ? ?Plans:  ?The patient will be discharged from the PACU to home when recovered from anesthesia and after chest x-ray is reviewed. We will review the cytology, pathology and microbiology results with the patient when they become available. Outpatient followup will be with Octavio Graves Annika Selke, DO. ? ?Garner Nash, DO ?[facility] ?06/06/2021 4:22 PM    ? ?

## 2021-06-06 NOTE — Anesthesia Procedure Notes (Signed)
Procedure Name: Intubation ?Date/Time: 06/06/2021 2:58 PM ?Performed by: Imagene Riches, CRNA ?Pre-anesthesia Checklist: Patient identified, Emergency Drugs available, Suction available and Patient being monitored ?Patient Re-evaluated:Patient Re-evaluated prior to induction ?Oxygen Delivery Method: Circle System Utilized ?Preoxygenation: Pre-oxygenation with 100% oxygen ?Induction Type: IV induction ?Ventilation: Mask ventilation without difficulty ?Laryngoscope Size: Sabra Heck and 2 ?Grade View: Grade I ?Tube type: Oral ?Tube size: 8.5 mm ?Number of attempts: 1 ?Airway Equipment and Method: Stylet and Oral airway ?Placement Confirmation: ETT inserted through vocal cords under direct vision, positive ETCO2 and breath sounds checked- equal and bilateral ?Secured at: 22 cm ?Tube secured with: Tape ?Dental Injury: Teeth and Oropharynx as per pre-operative assessment  ? ? ? ? ?

## 2021-06-07 LAB — ACID FAST SMEAR (AFB, MYCOBACTERIA): Acid Fast Smear: NEGATIVE

## 2021-06-08 ENCOUNTER — Encounter (HOSPITAL_COMMUNITY): Payer: Self-pay | Admitting: Pulmonary Disease

## 2021-06-08 LAB — CYTOLOGY - NON PAP

## 2021-06-09 LAB — CYTOLOGY - NON PAP

## 2021-06-09 NOTE — Interval H&P Note (Signed)
History and Physical Interval Note: ? ?06/09/2021 ?11:41 AM ? ?Kayla Mcclure  has presented today for surgery, with the diagnosis of bilateral lung nodules.  The various methods of treatment have been discussed with the patient and family. After consideration of risks, benefits and other options for treatment, the patient has consented to  Procedure(s) with comments: ?ROBOTIC ASSISTED NAVIGATIONAL BRONCHOSCOPY (Bilateral) - ION w/ CIOS ?BRONCHIAL BRUSHINGS ?BRONCHIAL BIOPSIES ?BRONCHIAL NEEDLE ASPIRATION BIOPSIES ?RADIAL ENDOBRONCHIAL ULTRASOUND ?FIDUCIAL MARKER PLACEMENT as a surgical intervention.  The patient's history has been reviewed, patient examined, no change in status, stable for surgery.  I have reviewed the patient's chart and labs.  Questions were answered to the patient's satisfaction.   ? ? ?Octavio Graves [name] ? ? ?

## 2021-06-11 LAB — AEROBIC/ANAEROBIC CULTURE W GRAM STAIN (SURGICAL/DEEP WOUND)
Culture: NO GROWTH
Gram Stain: NONE SEEN

## 2021-06-13 ENCOUNTER — Encounter: Payer: Self-pay | Admitting: Acute Care

## 2021-06-13 ENCOUNTER — Other Ambulatory Visit: Payer: Self-pay

## 2021-06-13 ENCOUNTER — Ambulatory Visit: Payer: Medicare Other | Admitting: Acute Care

## 2021-06-13 VITALS — BP 124/76 | HR 60 | Temp 98.0°F | Ht 59.0 in | Wt 123.0 lb

## 2021-06-13 DIAGNOSIS — R918 Other nonspecific abnormal finding of lung field: Secondary | ICD-10-CM | POA: Diagnosis not present

## 2021-06-13 DIAGNOSIS — F17210 Nicotine dependence, cigarettes, uncomplicated: Secondary | ICD-10-CM | POA: Diagnosis not present

## 2021-06-13 DIAGNOSIS — E440 Moderate protein-calorie malnutrition: Secondary | ICD-10-CM | POA: Diagnosis not present

## 2021-06-13 DIAGNOSIS — F17200 Nicotine dependence, unspecified, uncomplicated: Secondary | ICD-10-CM

## 2021-06-13 NOTE — Patient Instructions (Addendum)
It is good to see you today. ?Your biopsies were all negative. ?We will do a 3 month follow up CT Chest.  ?Follow up visit with Judson Roch NP or Dr. Valeta Harms after CT Chest.  ?Work on weight gain.  ?Continue Ensure as you have been doing. ?Eat small meals more frequently throughout the day.  ?Let me know if you would like a referral to dietitian ?Please contact office for sooner follow up if symptoms do not improve or worsen or seek emergency care   ?

## 2021-06-13 NOTE — Progress Notes (Signed)
t ? ?History of Present Illness ?Kayla Mcclure is a 76 y.o. female former smoker  with past medical history of coronary disease, NSTEMI, CKD, chronic diastolic heart failure, history of MI, history of stroke. She is followed by the lung cancer screening program. She had an abnormal low dose CT Chest, and PET scan . She was seen by Dr. Valeta Harms for bronchoscopy with tissue sampling on 06/06/2021.  ? ?Synopsis ?Pt with Past medical history of coronary disease, NSTEMI, CKD, chronic diastolic heart failure, history of MI, history of stroke.  She is a former smoker quit in 2015 she is not on any blood thinners or antiplatelets except for a daily 81 mg aspirin. The patient was enrolled in our lung cancer screening program had a abnormal lung cancer screening CT completed on 04/12/2021 which was read as suspicious for multiple nodules in both lobes upper and right lower.  Also had associated emphysema.  Patient was referred for nuclear medicine PET scan which was completed on 05/16/2021.  Nuclear medicine pet imaging revealed 3 pulmonary nodules all with elevated hypermetabolism the left upper lobe lesion as well as a right upper lobe lesion both with an SUV of greater than 4 and a smaller left upper lobe nodule with an SUV of 2.6.  This was concerning for multifocal malignancy, however reassuringly , biopsies were negative for malignancy. Cultures that have resulted  were negative for Fungus/ Aerobic. Anaerobic growth , and AFB.  ?  ? ? ?06/13/2021 ?Pt. Presents for follow up. She states she did well after the  bronchoscopy on 06/06/2021.  She had afew clots when she coughed, but that cleared quickly. She did have a sore throat, but that got better after a few days. She feels she is back to her baseline.  She denies any  wheezing,  No coughing up secretions . She is just anxious about her results. She has had a very hard 2 weeks. She lost her sister, and the next week lost her brother and had to admit her husband to the  hospital for hemoptysis,pneumonia, and CHF. We reviewed the results of her biopsy. I explained that there were no malignant cells in the tissue biopsies. Plan will be for a 3 month  follow up CT chest to ensure that there are no changes in her imaging.  ? ?Test Results: ?06/05/2021 Super D CT Chest ?Mediastinum/Nodes: Mediastinal lymph nodes have increased in size ?slightly in the interval. Index low right paratracheal lymph node ?measures 13 mm (2/53), compared to 6 mm on 04/12/2021. Subcarinal ?lymph node measures 1.7 cm, previously 1.2 cm. Hilar regions are ?difficult to evaluate without IV contrast. No axillary adenopathy. ?Esophagus is grossly unremarkable. ?  ?Lungs/Pleura: Upper and midlung zone predominant peribronchovascular ?ground-glass and nodularity with scattered mucoid impaction are mild ?bronchiectasis. Discrete pulmonary nodules measure up to 8 x 12 mm ?in the posteromedial right upper lobe, posterior to the right ?mainstem bronchus, and 9 x 13 mm in the central left upper lobe ?(3/39), as on 04/12/2021. No new pulmonary nodules. No pleural ?fluid. Airway is unremarkable. ? ?04/12/2021 Low Dose Screening CT Chest ?Lungs/Pleura: Centrilobular emphysema. Smoking related respiratory ?bronchiolitis. There are 3 new nodules bilaterally: 8.5 mm posterior ?segment right upper lobe nodule (3/115), 15.8 mm nodule in the ?superior segment right lower lobe, posterior to the right mainstem ?bronchus (3/142) and 15.2 mm left upper lobe nodule (3/92). No ?pleural fluid. Airway is unremarkable ? ?PET scan  05/16/2021 ?CHEST: 1.3 by 1.0 cm left upper lobe nodule on  image 53 series 4 has ?maximum SUV of 4.5, suspicious for malignancy. ?  ?The right upper lobe nodule posterior to the right upper lobe ?bronchus on image 64 series 4 measures about 1.3 by 0.9 cm and has a ?maximum SUV of 4.6, suspicious for malignancy. ?  ?The smaller left upper lobe nodule measuring about 0.5 cm in ?diameter on image 58 series 4 has  maximum SUV of 2.6, likewise ?suspicious given its small size. ?  ? ? ?Cytology 06/06/2021 LUL and RLL ? ?FINAL MICROSCOPIC DIAGNOSIS:  ?A. LUNG, LUL, FINE NEEDLE ASPIRATION:  ?- No malignant cells identified  ? ?B. LUNG, LUL, BRUSH:  ?- No malignant cells identified  ? ?FINAL MICROSCOPIC DIAGNOSIS:  ?D. LUNG, RLL, FINE NEEDLE ASPIRATION:  ?- No malignant cells identified.  ? ?COMMENT:  ? ?Occasional giant cells are present, and there is a suggestion of a rare  ?granuloma.  ? ?CBC Latest Ref Rng & Units 06/06/2021 09/09/2020 09/08/2020  ?WBC 4.0 - 10.5 K/uL 6.4 6.0 6.5  ?Hemoglobin 12.0 - 15.0 g/dL 12.8 11.0(L) 12.5  ?Hematocrit 36.0 - 46.0 % 40.6 33.9(L) 38.2  ?Platelets 150 - 400 K/uL 165 134(L) 146(L)  ? ? ?BMP Latest Ref Rng & Units 06/06/2021 09/09/2020 09/08/2020  ?Glucose 70 - 99 mg/dL 86 85 83  ?BUN 8 - 23 mg/dL 17 20 31(H)  ?Creatinine 0.44 - 1.00 mg/dL 1.14(H) 1.14(H) 1.50(H)  ?BUN/Creat Ratio 12 - 28 - - -  ?Sodium 135 - 145 mmol/L 140 137 138  ?Potassium 3.5 - 5.1 mmol/L 5.0 3.8 3.8  ?Chloride 98 - 111 mmol/L 107 105 104  ?CO2 22 - 32 mmol/L '24 25 27  '$ ?Calcium 8.9 - 10.3 mg/dL 9.2 8.6(L) 8.6(L)  ? ? ?BNP ?   ?Component Value Date/Time  ? BNP 136.4 (H) 09/07/2020 1215  ? ? ?ProBNP ?No results found for: PROBNP ? ?PFT ?No results found for: FEV1PRE, FEV1POST, FVCPRE, FVCPOST, TLC, DLCOUNC, PREFEV1FVCRT, PSTFEV1FVCRT ? ?NM PET Image Initial (PI) Skull Base To Thigh ? ?Result Date: 05/17/2021 ?CLINICAL DATA:  Initial treatment strategy for lung nodule. EXAM: NUCLEAR MEDICINE PET SKULL BASE TO THIGH TECHNIQUE: 6.0 mCi F-18 FDG was injected intravenously. Full-ring PET imaging was performed from the skull base to thigh after the radiotracer. CT data was obtained and used for attenuation correction and anatomic localization. Fasting blood glucose: 92 mg/dl COMPARISON:  CT chest 04/12/2021 FINDINGS: Mediastinal blood pool activity: SUV max 2.1 Liver activity: SUV max N/A NECK: No significant abnormal hypermetabolic activity  in this region. Incidental CT findings: Prior carotid endarterectomy. CHEST: 1.3 by 1.0 cm left upper lobe nodule on image 53 series 4 has maximum SUV of 4.5, suspicious for malignancy. The right upper lobe nodule posterior to the right upper lobe bronchus on image 64 series 4 measures about 1.3 by 0.9 cm and has a maximum SUV of 4.6, suspicious for malignancy. The smaller left upper lobe nodule measuring about 0.5 cm in diameter on image 58 series 4 has maximum SUV of 2.6, likewise suspicious given its small size. Incidental CT findings: Scattered scarring in the lungs. Coronary, aortic arch, and branch vessel atherosclerotic vascular disease. Emphysema noted. ABDOMEN/PELVIS: No significant abnormal hypermetabolic activity in this region. Incidental CT findings: Atherosclerosis is present, including aortoiliac atherosclerotic disease. Densely calcified uterine fibroid. SKELETON: No significant abnormal hypermetabolic activity in this region. Incidental CT findings: none IMPRESSION: 1. The 3 pulmonary nodules of concern on recent CT are all hypermetabolic, favoring multifocal malignancy. 2. Other imaging findings of potential clinical  significance: Aortic Atherosclerosis (ICD10-I70.0). Systemic atherosclerosis. Coronary atherosclerosis. Densely calcified uterine fibroid. Scattered scarring in the lungs. Emphysema (ICD10-J43.9). Electronically Signed   By: Van Clines M.D.   On: 05/17/2021 06:57  ? ?DG CHEST PORT 1 VIEW ? ?Result Date: 06/06/2021 ?CLINICAL DATA:  Status post bronchoscopy EXAM: PORTABLE CHEST 1 VIEW COMPARISON:  Previous studies including CT chest done on 06/05/2021 FINDINGS: Transverse diameter of heart is increased. Central pulmonary vessels are prominent. There are no signs of alveolar pulmonary edema or focal pulmonary consolidation. There is no significant pleural effusion or pneumothorax. IMPRESSION: Cardiomegaly. Central pulmonary vessels are prominent without signs of alveolar pulmonary  edema. There is no focal pulmonary consolidation. There is no pleural effusion. Electronically Signed   By: Elmer Picker M.D.   On: 06/06/2021 17:02  ? ?CT Super D Chest Wo Contrast ? ?Result Date: 3/6/

## 2021-06-23 NOTE — Telephone Encounter (Signed)
Error

## 2021-06-26 DIAGNOSIS — I5032 Chronic diastolic (congestive) heart failure: Secondary | ICD-10-CM | POA: Diagnosis not present

## 2021-06-26 DIAGNOSIS — E785 Hyperlipidemia, unspecified: Secondary | ICD-10-CM | POA: Diagnosis not present

## 2021-06-26 DIAGNOSIS — I10 Essential (primary) hypertension: Secondary | ICD-10-CM | POA: Diagnosis not present

## 2021-06-26 DIAGNOSIS — J4530 Mild persistent asthma, uncomplicated: Secondary | ICD-10-CM | POA: Diagnosis not present

## 2021-06-30 ENCOUNTER — Other Ambulatory Visit: Payer: Self-pay | Admitting: *Deleted

## 2021-06-30 DIAGNOSIS — I6523 Occlusion and stenosis of bilateral carotid arteries: Secondary | ICD-10-CM

## 2021-07-03 NOTE — Progress Notes (Signed)
?Office Note  ? ? ? ?CC:  follow up ?Requesting Provider:  Lois Huxley, PA ? ?HPI: Kayla Mcclure is a 76 y.o. (March 26, 1946) female who presents for follow up of carotid artery stenosis. She has history of right carotid endarterectomy with patch angioplasty on Aug 27, 2019 by Dr. Donzetta Matters.  This was for asymptomatic high grade stenosis. She has a prior history of symptomatic left ICA lesion and is status post left carotid endarterectomy on July 28, 2019.  At that time, she presented with a left-sided TIA resulting in slurred speech and right arm numbness. She has some residual intermittent slurred speech secondary to her TIA. She otherwise reports no new symptoms. She denies amaurosis fugax or monocular blindness, slurred speech, facial drooping, unilateral upper or lower extremity weakness or numbness.  ? ?She has some claudication symptoms in her right leg only on very prolonged walking. This is not disabling. She has no symptoms in  LLE. She denies any rest pain or tissue loss. History of Left Common iliac artery stent by Dr. Gwenlyn Found in 2015.  ? ?The pt is on a statin for cholesterol management.  ?The pt is on a daily aspirin.   Other AC:  none ?The pt is on ARB, CCB, BB for hypertension.   ?The pt is not diabetic.   ?Tobacco hx:  current, some day  ? ?Past Medical History:  ?Diagnosis Date  ? Acute GI bleeding 08/07/2019  ? Asthma 10/31/2007  ? CAD (coronary artery disease) 08/05/2007  ? a) NSTEMI 2009: PCI to the RCA Promus DES 2.5 mm x 23 mm (3.0 mm). b) Myoview June 2017: Nonspecific ST changes. EF greater than 65%. LOW RISK, normal study. No ischemia or infarction.  ? Carotid artery disease ([place])   ? s/p Bilateral CEA March & May 2020.   ? Chronic back pain   ? Chronic diastolic heart failure ([place])   ? CKD (chronic kidney disease), stage II   ? CKD (chronic kidney disease), stage III ([place])   ? Diverticulosis 04/11/2010  ? Colonoscopy  ? Dyslipidemia, goal LDL below 70   ? Essential hypertension   ? a.  Normal renal arteries by PV angio 05/2013.  ? GERD (gastroesophageal reflux disease)   ? GI bleed 12/2015  ? History of kidney stones 1975  ? surgery to remove  ? History of recent blood transfusion 12/2015  ? 12-19-15, 12-20-15 and 12-21-15  ? Insomnia   ? Internal hemorrhoids 04/11/2010  ? By colonoscopy  ? Iron deficiency anemia   ? Lung nodules 05/2021  ? bilateral  ? Myocardial infarction [facility]) 2009  ? Peripheral arterial disease ([place])   ? a. s/p PTA and stenting of L CIA stenosis 05/2013.  ? Renal insufficiency   ? Stroke [facility])   ? mini stroke  ? Tobacco abuse   ? Quit in 05/2013  ? ? ?Past Surgical History:  ?Procedure Laterality Date  ? BRONCHIAL BIOPSY  06/06/2021  ? Procedure: BRONCHIAL BIOPSIES;  Surgeon: Garner Nash, DO;  Location: [place] ENDOSCOPY;  Service: Pulmonary;;  ? BRONCHIAL BRUSHINGS  06/06/2021  ? Procedure: BRONCHIAL BRUSHINGS;  Surgeon: Garner Nash, DO;  Location: [place];  Service: Pulmonary;;  ? BRONCHIAL NEEDLE ASPIRATION BIOPSY  06/06/2021  ? Procedure: BRONCHIAL NEEDLE ASPIRATION BIOPSIES;  Surgeon: Garner Nash, DO;  Location: [place] ENDOSCOPY;  Service: Pulmonary;;  ? CAPSULE ENDOSCOPY  10/27/2019  ? [facility]) evidence of "pinhole leaking" in both stomach and proximal small bowel; no overt bleeding noted  ?  CHOLECYSTECTOMY  1975  ? COLONOSCOPY  04/11/2010  ? COLONOSCOPY  11/30/2015  ? CORONARY ANGIOPLASTY WITH STENT PLACEMENT  08/05/2007  ? Promus DES 2.5 mm x 23 mm (3 mm)  to RCA; EF 60-70%  ? ENDARTERECTOMY Left 07/28/2019  ? Procedure: ENDARTERECTOMY CAROTID;  Surgeon: Waynetta Sandy, MD;  Location: [place];  Service: Vascular;  Laterality: Left;  ? ENDARTERECTOMY Right 08/27/2019  ? Procedure: ENDARTERECTOMY CAROTID RIGHT WITH PATCH ANGIOPLASTY;  Surgeon: Waynetta Sandy, MD;  Location: [place];  Service: Vascular;  Laterality: Right;  ? ENTEROSCOPY N/A 12/27/2015  ? Procedure: ENTEROSCOPY;  Surgeon: Clarene Essex, MD;  Location: [facility] ENDOSCOPY;  Service: Endoscopy;   Laterality: N/A;  also needs slim egd scope  ? ENTEROSCOPY N/A 05/15/2019  ? Procedure: ENTEROSCOPY;  Surgeon: Ronald Lobo, MD;  Location: [place];  Service: Endoscopy;  Laterality: N/A;  Push enteroscopy using either ultraslim colonoscope or push enteroscope  ? ENTEROSCOPY N/A 08/08/2019  ? Procedure: ENTEROSCOPY;  Surgeon: Ronnette Juniper, MD;  Location: [place];  Service: Gastroenterology;  Laterality: N/A;  ? ESOPHAGOGASTRODUODENOSCOPY N/A 09/13/2013  ? Procedure: ESOPHAGOGASTRODUODENOSCOPY (EGD);  Surgeon: Lear Ng, MD;  Location: [facility] ENDOSCOPY;  Service: Endoscopy;  Laterality: N/A;  ? ESOPHAGOGASTRODUODENOSCOPY N/A 07/14/2015  ? Procedure: ESOPHAGOGASTRODUODENOSCOPY (EGD);  Surgeon: Clarene Essex, MD;  Location: [facility] ENDOSCOPY;  Service: Endoscopy;  Laterality: N/A;  ? ESOPHAGOGASTRODUODENOSCOPY (EGD) WITH PROPOFOL N/A 09/09/2020  ? Procedure: ESOPHAGOGASTRODUODENOSCOPY (EGD) WITH PROPOFOL;  Surgeon: Clarene Essex, MD;  Location: [place];  Service: Endoscopy;  Laterality: N/A;  ? FIDUCIAL MARKER PLACEMENT  06/06/2021  ? Procedure: FIDUCIAL MARKER PLACEMENT;  Surgeon: Garner Nash, DO;  Location: [place] ENDOSCOPY;  Service: Pulmonary;;  ? gi bleed  07/2015  ? blood given  ? HOT HEMOSTASIS N/A 12/27/2015  ? Procedure: HOT HEMOSTASIS (ARGON PLASMA COAGULATION/BICAP);  Surgeon: Clarene Essex, MD;  Location: [facility];  Service: Endoscopy;  Laterality: N/A;  ? HOT HEMOSTASIS N/A 08/08/2019  ? Procedure: HOT HEMOSTASIS (ARGON PLASMA COAGULATION/BICAP);  Surgeon: Ronnette Juniper, MD;  Location: [place];  Service: Gastroenterology;  Laterality: N/A;  ? HOT HEMOSTASIS N/A 09/09/2020  ? Procedure: HOT HEMOSTASIS (ARGON PLASMA COAGULATION/BICAP);  Surgeon: Clarene Essex, MD;  Location: [place];  Service: Endoscopy;  Laterality: N/A;  ? ILIAC ARTERY STENT Left 05/04/2013  ? L CIA-EIA -- Dr. Gwenlyn Found  ? LEFT HEART CATH AND CORONARY ANGIOGRAPHY N/A 02/11/2019  ? Procedure: LEFT HEART CATH AND CORONARY ANGIOGRAPHY;   Surgeon: Leonie Man, MD;  Location: [place] CV LAB;;-  Patent RCA stent with minimal ISR after pRCA 50%).  Small caliber RPAV with ~75%.  pLAD 30%, m-dLAD 40%, D2 40%.  Normal LVEDP and LVEF.  ? LOWER EXTREMITY ANGIOGRAM Bilateral 05/04/2013  ? Procedure: LOWER EXTREMITY ANGIOGRAM;  Surgeon: Lorretta Harp, MD;  Location: [facility] CATH LAB;  Service: Cardiovascular;  Laterality: Bilateral;  ? NM MYOVIEW LTD  09/07/2015  ? EF > 65%. Nonspecific ST changes but no ischemic changes. No ischemia or infarction. LOW RISK  ? PATCH ANGIOPLASTY Left 07/28/2019  ? Procedure: PATCH ANGIOPLASTY USING Rueben Bash BIOLOGIC PATCH;  Surgeon: Waynetta Sandy, MD;  Location: [place];  Service: Vascular;  Laterality: Left;  ? TONSILLECTOMY  1960's  ? TRANSTHORACIC ECHOCARDIOGRAM  05/14/2019  ? Normal EF 60 to 65%.  No R WMA.   Normal RV but mildly elevated pressures.  Relatively normal aortic and mitral valves.  ? UPPER GI ENDOSCOPY  12/14/2015  ? VIDEO BRONCHOSCOPY WITH RADIAL ENDOBRONCHIAL ULTRASOUND  06/06/2021  ? Procedure: RADIAL ENDOBRONCHIAL ULTRASOUND;  Surgeon: Garner Nash, DO;  Location: [place] ENDOSCOPY;  Service: Pulmonary;;  ? ? ?Social History  ? ?Socioeconomic History  ? Marital status: Married  ?  Spouse name: Not on file  ? Number of children: 1  ? Years of education: 35  ? Highest education level: High school graduate  ?Occupational History  ?  Employer: RETIRED  ?Tobacco Use  ? Smoking status: Light Smoker  ?  Types: Cigarettes  ?  Last attempt to quit: 05/04/2013  ?  Years since quitting: 8.1  ? Smokeless tobacco: Never  ? Tobacco comments:  ?  She initially quit, but has gone back to smoking a couple cigarettes every now and then.  ?Vaping Use  ? Vaping Use: Never used  ?Substance and Sexual Activity  ? Alcohol use: Yes  ?  Comment: occasionally 3 times a year  ? Drug use: No  ? Sexual activity: Not Currently  ?  Partners: Male  ?  Birth control/protection: None, Post-menopausal  ?Other Topics Concern  ? Not  on file  ?Social History Narrative  ? Married. Has one child. Grandmother for a great-grandmother and 1.  ? No real exercise.  ? Occasional alcohol consumption.  ? Quit smoking inf Feb 2015 -- after L Iliac Stent

## 2021-07-05 ENCOUNTER — Ambulatory Visit (HOSPITAL_COMMUNITY)
Admission: RE | Admit: 2021-07-05 | Discharge: 2021-07-05 | Disposition: A | Discharge status: Home / Self Care | Payer: Medicare Other | Source: Ambulatory Visit | Attending: Vascular Surgery | Admitting: Vascular Surgery

## 2021-07-05 ENCOUNTER — Encounter: Payer: Self-pay | Admitting: Physician Assistant

## 2021-07-05 ENCOUNTER — Ambulatory Visit: Payer: Medicare Other | Admitting: Physician Assistant

## 2021-07-05 VITALS — BP 145/67 | HR 52 | Temp 96.4°F | Ht 59.0 in | Wt 125.4 lb

## 2021-07-05 DIAGNOSIS — I6523 Occlusion and stenosis of bilateral carotid arteries: Secondary | ICD-10-CM | POA: Diagnosis not present

## 2021-07-05 LAB — FUNGUS CULTURE WITH STAIN

## 2021-07-05 LAB — FUNGUS CULTURE RESULT

## 2021-07-05 LAB — FUNGAL ORGANISM REFLEX

## 2021-07-13 ENCOUNTER — Ambulatory Visit
Admission: RE | Admit: 2021-07-13 | Discharge: 2021-07-13 | Disposition: A | Discharge status: Home / Self Care | Payer: Medicare Other | Source: Ambulatory Visit | Attending: Family Medicine | Admitting: Family Medicine

## 2021-07-13 DIAGNOSIS — M810 Age-related osteoporosis without current pathological fracture: Secondary | ICD-10-CM | POA: Diagnosis not present

## 2021-07-13 DIAGNOSIS — Z780 Asymptomatic menopausal state: Secondary | ICD-10-CM | POA: Diagnosis not present

## 2021-07-13 DIAGNOSIS — E2839 Other primary ovarian failure: Secondary | ICD-10-CM

## 2021-07-13 DIAGNOSIS — M85852 Other specified disorders of bone density and structure, left thigh: Secondary | ICD-10-CM | POA: Diagnosis not present

## 2021-07-20 LAB — ACID FAST CULTURE WITH REFLEXED SENSITIVITIES (MYCOBACTERIA): Acid Fast Culture: NEGATIVE

## 2021-07-26 DIAGNOSIS — I10 Essential (primary) hypertension: Secondary | ICD-10-CM | POA: Diagnosis not present

## 2021-07-26 DIAGNOSIS — K219 Gastro-esophageal reflux disease without esophagitis: Secondary | ICD-10-CM | POA: Diagnosis not present

## 2021-07-26 DIAGNOSIS — E785 Hyperlipidemia, unspecified: Secondary | ICD-10-CM | POA: Diagnosis not present

## 2021-07-26 DIAGNOSIS — I779 Disorder of arteries and arterioles, unspecified: Secondary | ICD-10-CM | POA: Diagnosis not present

## 2021-07-26 DIAGNOSIS — I25119 Atherosclerotic heart disease of native coronary artery with unspecified angina pectoris: Secondary | ICD-10-CM | POA: Diagnosis not present

## 2021-07-26 DIAGNOSIS — I5032 Chronic diastolic (congestive) heart failure: Secondary | ICD-10-CM | POA: Diagnosis not present

## 2021-07-26 DIAGNOSIS — I2510 Atherosclerotic heart disease of native coronary artery without angina pectoris: Secondary | ICD-10-CM | POA: Diagnosis not present

## 2021-07-26 DIAGNOSIS — M810 Age-related osteoporosis without current pathological fracture: Secondary | ICD-10-CM | POA: Diagnosis not present

## 2021-07-26 DIAGNOSIS — I700 Atherosclerosis of aorta: Secondary | ICD-10-CM | POA: Diagnosis not present

## 2021-07-26 DIAGNOSIS — J449 Chronic obstructive pulmonary disease, unspecified: Secondary | ICD-10-CM | POA: Diagnosis not present

## 2021-07-26 DIAGNOSIS — D500 Iron deficiency anemia secondary to blood loss (chronic): Secondary | ICD-10-CM | POA: Diagnosis not present

## 2021-07-27 DIAGNOSIS — E559 Vitamin D deficiency, unspecified: Secondary | ICD-10-CM | POA: Diagnosis not present

## 2021-07-27 DIAGNOSIS — E785 Hyperlipidemia, unspecified: Secondary | ICD-10-CM | POA: Diagnosis not present

## 2021-08-21 DIAGNOSIS — I10 Essential (primary) hypertension: Secondary | ICD-10-CM | POA: Diagnosis not present

## 2021-08-21 DIAGNOSIS — I2510 Atherosclerotic heart disease of native coronary artery without angina pectoris: Secondary | ICD-10-CM | POA: Diagnosis not present

## 2021-08-21 DIAGNOSIS — K219 Gastro-esophageal reflux disease without esophagitis: Secondary | ICD-10-CM | POA: Diagnosis not present

## 2021-08-21 DIAGNOSIS — J4530 Mild persistent asthma, uncomplicated: Secondary | ICD-10-CM | POA: Diagnosis not present

## 2021-08-21 DIAGNOSIS — E785 Hyperlipidemia, unspecified: Secondary | ICD-10-CM | POA: Diagnosis not present

## 2021-09-14 DIAGNOSIS — R195 Other fecal abnormalities: Secondary | ICD-10-CM | POA: Diagnosis not present

## 2021-09-14 DIAGNOSIS — R42 Dizziness and giddiness: Secondary | ICD-10-CM | POA: Diagnosis not present

## 2021-09-15 ENCOUNTER — Ambulatory Visit (INDEPENDENT_AMBULATORY_CARE_PROVIDER_SITE_OTHER)
Admission: RE | Admit: 2021-09-15 | Discharge: 2021-09-15 | Disposition: A | Discharge status: Home / Self Care | Payer: Medicare Other | Source: Ambulatory Visit | Attending: Acute Care | Admitting: Acute Care

## 2021-09-15 DIAGNOSIS — R918 Other nonspecific abnormal finding of lung field: Secondary | ICD-10-CM | POA: Diagnosis not present

## 2021-09-15 DIAGNOSIS — R590 Localized enlarged lymph nodes: Secondary | ICD-10-CM | POA: Diagnosis not present

## 2021-09-15 DIAGNOSIS — R911 Solitary pulmonary nodule: Secondary | ICD-10-CM | POA: Diagnosis not present

## 2021-09-21 DIAGNOSIS — J4530 Mild persistent asthma, uncomplicated: Secondary | ICD-10-CM | POA: Diagnosis not present

## 2021-09-21 DIAGNOSIS — E785 Hyperlipidemia, unspecified: Secondary | ICD-10-CM | POA: Diagnosis not present

## 2021-09-21 DIAGNOSIS — I5032 Chronic diastolic (congestive) heart failure: Secondary | ICD-10-CM | POA: Diagnosis not present

## 2021-09-21 DIAGNOSIS — I10 Essential (primary) hypertension: Secondary | ICD-10-CM | POA: Diagnosis not present

## 2021-10-23 DIAGNOSIS — S99921A Unspecified injury of right foot, initial encounter: Secondary | ICD-10-CM | POA: Diagnosis not present

## 2021-10-31 DIAGNOSIS — I129 Hypertensive chronic kidney disease with stage 1 through stage 4 chronic kidney disease, or unspecified chronic kidney disease: Secondary | ICD-10-CM | POA: Diagnosis not present

## 2021-10-31 DIAGNOSIS — R918 Other nonspecific abnormal finding of lung field: Secondary | ICD-10-CM | POA: Diagnosis not present

## 2021-10-31 DIAGNOSIS — I2510 Atherosclerotic heart disease of native coronary artery without angina pectoris: Secondary | ICD-10-CM | POA: Diagnosis not present

## 2021-10-31 DIAGNOSIS — N2581 Secondary hyperparathyroidism of renal origin: Secondary | ICD-10-CM | POA: Diagnosis not present

## 2021-10-31 DIAGNOSIS — N183 Chronic kidney disease, stage 3 unspecified: Secondary | ICD-10-CM | POA: Diagnosis not present

## 2021-11-02 ENCOUNTER — Other Ambulatory Visit: Payer: Self-pay | Admitting: Nephrology

## 2021-11-02 DIAGNOSIS — N1832 Chronic kidney disease, stage 3b: Secondary | ICD-10-CM

## 2021-11-07 DIAGNOSIS — R42 Dizziness and giddiness: Secondary | ICD-10-CM | POA: Diagnosis not present

## 2021-11-09 ENCOUNTER — Ambulatory Visit
Admission: RE | Admit: 2021-11-09 | Discharge: 2021-11-09 | Disposition: A | Discharge status: Home / Self Care | Payer: Medicare Other | Source: Ambulatory Visit | Attending: Nephrology | Admitting: Nephrology

## 2021-11-09 DIAGNOSIS — N1832 Chronic kidney disease, stage 3b: Secondary | ICD-10-CM

## 2021-11-09 DIAGNOSIS — N281 Cyst of kidney, acquired: Secondary | ICD-10-CM | POA: Diagnosis not present

## 2021-11-15 DIAGNOSIS — N183 Chronic kidney disease, stage 3 unspecified: Secondary | ICD-10-CM | POA: Diagnosis not present

## 2021-11-15 DIAGNOSIS — I129 Hypertensive chronic kidney disease with stage 1 through stage 4 chronic kidney disease, or unspecified chronic kidney disease: Secondary | ICD-10-CM | POA: Diagnosis not present

## 2021-11-16 DIAGNOSIS — I2510 Atherosclerotic heart disease of native coronary artery without angina pectoris: Secondary | ICD-10-CM | POA: Diagnosis not present

## 2021-11-16 DIAGNOSIS — I10 Essential (primary) hypertension: Secondary | ICD-10-CM | POA: Diagnosis not present

## 2021-11-16 DIAGNOSIS — J4530 Mild persistent asthma, uncomplicated: Secondary | ICD-10-CM | POA: Diagnosis not present

## 2021-11-16 DIAGNOSIS — E785 Hyperlipidemia, unspecified: Secondary | ICD-10-CM | POA: Diagnosis not present

## 2021-11-16 DIAGNOSIS — K219 Gastro-esophageal reflux disease without esophagitis: Secondary | ICD-10-CM | POA: Diagnosis not present

## 2021-12-13 DIAGNOSIS — I951 Orthostatic hypotension: Secondary | ICD-10-CM | POA: Diagnosis not present

## 2021-12-13 DIAGNOSIS — H6122 Impacted cerumen, left ear: Secondary | ICD-10-CM | POA: Diagnosis not present

## 2021-12-13 DIAGNOSIS — R42 Dizziness and giddiness: Secondary | ICD-10-CM | POA: Insufficient documentation

## 2021-12-13 DIAGNOSIS — T50905A Adverse effect of unspecified drugs, medicaments and biological substances, initial encounter: Secondary | ICD-10-CM | POA: Diagnosis not present

## 2021-12-21 ENCOUNTER — Other Ambulatory Visit: Payer: Self-pay | Admitting: Cardiology

## 2021-12-25 ENCOUNTER — Other Ambulatory Visit: Payer: Self-pay | Admitting: *Deleted

## 2021-12-25 DIAGNOSIS — J4530 Mild persistent asthma, uncomplicated: Secondary | ICD-10-CM | POA: Diagnosis not present

## 2021-12-25 DIAGNOSIS — K219 Gastro-esophageal reflux disease without esophagitis: Secondary | ICD-10-CM | POA: Diagnosis not present

## 2021-12-25 DIAGNOSIS — N182 Chronic kidney disease, stage 2 (mild): Secondary | ICD-10-CM

## 2021-12-25 DIAGNOSIS — E785 Hyperlipidemia, unspecified: Secondary | ICD-10-CM | POA: Diagnosis not present

## 2021-12-25 DIAGNOSIS — I10 Essential (primary) hypertension: Secondary | ICD-10-CM | POA: Diagnosis not present

## 2022-01-03 ENCOUNTER — Encounter: Payer: Self-pay | Admitting: Vascular Surgery

## 2022-01-03 ENCOUNTER — Ambulatory Visit (INDEPENDENT_AMBULATORY_CARE_PROVIDER_SITE_OTHER)
Admission: RE | Admit: 2022-01-03 | Discharge: 2022-01-03 | Disposition: A | Discharge status: Home / Self Care | Payer: Medicare Other | Source: Ambulatory Visit | Attending: Vascular Surgery | Admitting: Vascular Surgery

## 2022-01-03 ENCOUNTER — Ambulatory Visit (HOSPITAL_COMMUNITY)
Admission: RE | Admit: 2022-01-03 | Discharge: 2022-01-03 | Disposition: A | Discharge status: Home / Self Care | Payer: Medicare Other | Source: Ambulatory Visit | Attending: Vascular Surgery | Admitting: Vascular Surgery

## 2022-01-03 ENCOUNTER — Ambulatory Visit: Payer: Medicare Other | Admitting: Vascular Surgery

## 2022-01-03 VITALS — BP 146/81 | HR 54 | Temp 97.9°F | Resp 20 | Ht 59.0 in | Wt 124.0 lb

## 2022-01-03 DIAGNOSIS — N185 Chronic kidney disease, stage 5: Secondary | ICD-10-CM

## 2022-01-03 DIAGNOSIS — N182 Chronic kidney disease, stage 2 (mild): Secondary | ICD-10-CM | POA: Insufficient documentation

## 2022-01-03 NOTE — Progress Notes (Signed)
Patient ID: Kayla Mcclure, female   DOB: [birth year], 76 y.o.   MRN: [id number]  Reason for Consult: Follow-up   Referred by Gean Quint, MD  Subjective:     HPI:  Kayla Mcclure is a 76 y.o. female history of bilateral carotid endarterectomies.  She now has advanced chronic kidney disease she has not required dialysis yet.  She is right-hand dominant.  No history of chest or breast surgery no history of pacemaker, defibrillator or port placement.  Past Medical History:  Diagnosis Date   Acute GI bleeding 08/07/2019   Asthma 10/31/2007   CAD (coronary artery disease) 08/05/2007   a) NSTEMI 2009: PCI to the RCA Promus DES 2.5 mm x 23 mm (3.0 mm). b) Myoview June 2017: Nonspecific ST changes. EF greater than 65%. LOW RISK, normal study. No ischemia or infarction.   Carotid artery disease [facility])    s/p Bilateral CEA March & May 2020.    Chronic back pain    Chronic diastolic heart failure (HCC)    CKD (chronic kidney disease), stage II    CKD (chronic kidney disease), stage III ([place])    Diverticulosis 04/11/2010   Colonoscopy   Dyslipidemia, goal LDL below 70    Essential hypertension    a. Normal renal arteries by PV angio 05/2013.   GERD (gastroesophageal reflux disease)    GI bleed 12/2015   History of kidney stones 1975   surgery to remove   History of recent blood transfusion 12/2015   12-19-15, 12-20-15 and 12-21-15   Insomnia    Internal hemorrhoids 04/11/2010   By colonoscopy   Iron deficiency anemia    Lung nodules 05/2021   bilateral   Myocardial infarction [facility]) 2009   Peripheral arterial disease ([place])    a. s/p PTA and stenting of L CIA stenosis 05/2013.   Renal insufficiency    Stroke [facility])    mini stroke   Tobacco abuse    Quit in 05/2013   Family History  Problem Relation Age of Onset   Diabetes Mother    Heart disease Mother    Heart attack Mother 43   Breast cancer Mother 47   Colon cancer Mother 36   Heart attack Father 34   Pneumonia  Father    Diabetes Sister    Breast cancer Sister    Breast cancer Sister 72   Past Surgical History:  Procedure Laterality Date   BRONCHIAL BIOPSY  06/06/2021   Procedure: BRONCHIAL BIOPSIES;  Surgeon: Garner Nash, DO;  Location: [place] [facility];  Service: Pulmonary;;   BRONCHIAL BRUSHINGS  06/06/2021   Procedure: BRONCHIAL BRUSHINGS;  Surgeon: Garner Nash, DO;  Location: [place] [facility];  Service: Pulmonary;;   BRONCHIAL NEEDLE ASPIRATION BIOPSY  06/06/2021   Procedure: BRONCHIAL NEEDLE ASPIRATION BIOPSIES;  Surgeon: Garner Nash, DO;  Location: [place] [facility];  Service: Pulmonary;;   CAPSULE ENDOSCOPY  10/27/2019   [facility]) evidence of "pinhole leaking" in both stomach and proximal small bowel; no overt bleeding noted   CHOLECYSTECTOMY  1975   COLONOSCOPY  04/11/2010   COLONOSCOPY  11/30/2015   CORONARY ANGIOPLASTY WITH STENT PLACEMENT  08/05/2007   Promus DES 2.5 mm x 23 mm (3 mm)  to RCA; EF 60-70%   ENDARTERECTOMY Left 07/28/2019   Procedure: ENDARTERECTOMY CAROTID;  Surgeon: Waynetta Sandy, MD;  Location: [place];  Service: Vascular;  Laterality: Left;   ENDARTERECTOMY Right 08/27/2019   Procedure: ENDARTERECTOMY CAROTID RIGHT WITH PATCH ANGIOPLASTY;  Surgeon: Donzetta Matters,  Georgia Dom, MD;  Location: [place];  Service: Vascular;  Laterality: Right;   ENTEROSCOPY N/A 12/27/2015   Procedure: ENTEROSCOPY;  Surgeon: Clarene Essex, MD;  Location: [facility];  Service: Endoscopy;  Laterality: N/A;  also needs slim egd scope   ENTEROSCOPY N/A 05/15/2019   Procedure: ENTEROSCOPY;  Surgeon: Ronald Lobo, MD;  Location: [place];  Service: Endoscopy;  Laterality: N/A;  Push enteroscopy using either ultraslim colonoscope or push enteroscope   ENTEROSCOPY N/A 08/08/2019   Procedure: ENTEROSCOPY;  Surgeon: Ronnette Juniper, MD;  Location: [place];  Service: Gastroenterology;  Laterality: N/A;   ESOPHAGOGASTRODUODENOSCOPY N/A 09/13/2013   Procedure: ESOPHAGOGASTRODUODENOSCOPY (EGD);   Surgeon: Lear Ng, MD;  Location: [facility];  Service: Endoscopy;  Laterality: N/A;   ESOPHAGOGASTRODUODENOSCOPY N/A 07/14/2015   Procedure: ESOPHAGOGASTRODUODENOSCOPY (EGD);  Surgeon: Clarene Essex, MD;  Location[facility];  Service: Endoscopy;  Laterality: N/A;   ESOPHAGOGASTRODUODENOSCOPY (EGD) WITH PROPOFOL N/A 09/09/2020   Procedure: ESOPHAGOGASTRODUODENOSCOPY (EGD) WITH PROPOFOL;  Surgeon: Clarene Essex, MD;  Location: [place];  Service: Endoscopy;  Laterality: N/A;   FIDUCIAL MARKER PLACEMENT  06/06/2021   Procedure: FIDUCIAL MARKER PLACEMENT;  Surgeon: Garner Nash, DO;  Location: [place];  Service: Pulmonary;;   gi bleed  07/2015   blood given   HOT HEMOSTASIS N/A 12/27/2015   Procedure: HOT HEMOSTASIS (ARGON PLASMA COAGULATION/BICAP);  Surgeon: Clarene Essex, MD;  Location: [facility];  Service: Endoscopy;  Laterality: N/A;   HOT HEMOSTASIS N/A 08/08/2019   Procedure: HOT HEMOSTASIS (ARGON PLASMA COAGULATION/BICAP);  Surgeon: Ronnette Juniper, MD;  Location: [place];  Service: Gastroenterology;  Laterality: N/A;   HOT HEMOSTASIS N/A 09/09/2020   Procedure: HOT HEMOSTASIS (ARGON PLASMA COAGULATION/BICAP);  Surgeon: Clarene Essex, MD;  Location: [place];  Service: Endoscopy;  Laterality: N/A;   ILIAC ARTERY STENT Left 05/04/2013   L CIA-EIA -- Dr. Gwenlyn Found   LEFT HEART CATH AND CORONARY ANGIOGRAPHY N/A 02/11/2019   Procedure: LEFT HEART CATH AND CORONARY ANGIOGRAPHY;  Surgeon: Leonie Man, MD;  Location: [place] [facility];;-  Patent RCA stent with minimal ISR after pRCA 50%).  Small caliber RPAV with ~75%.  pLAD 30%, m-dLAD 40%, D2 40%.  Normal LVEDP and LVEF.   LOWER EXTREMITY ANGIOGRAM Bilateral 05/04/2013   Procedure: LOWER EXTREMITY ANGIOGRAM;  Surgeon: Lorretta Harp, MD;  Location: [facility] C[facility];  Service: Cardiovascular;  Laterality: Bilateral;   NM MYOVIEW LTD  09/07/2015   EF > 65%. Nonspecific ST changes but no ischemic changes. No ischemia or infarction.  LOW RISK   PATCH ANGIOPLASTY Left 07/28/2019   Procedure: PATCH ANGIOPLASTY USING Rueben Bash BIOLOGIC PATCH;  Surgeon: Waynetta Sandy, MD;  Location: [place];  Service: Vascular;  Laterality: Left;   TONSILLECTOMY  1960's   TRANSTHORACIC ECHOCARDIOGRAM  05/14/2019   Normal EF 60 to 65%.  No R WMA.   Normal RV but mildly elevated pressures.  Relatively normal aortic and mitral valves.   UPPER GI ENDOSCOPY  12/14/2015   VIDEO BRONCHOSCOPY WITH RADIAL ENDOBRONCHIAL ULTRASOUND  06/06/2021   Procedure: RADIAL ENDOBRONCHIAL ULTRASOUND;  Surgeon: Garner Nash, DO;  Location: [facility];  Service: Pulmonary;;    Short Social History:  Social History   Tobacco Use   Smoking status: Some Days    Types: Cigarettes    Last attempt to quit: 05/04/2013    Years since quitting: 8.6   Smokeless tobacco: Never   Tobacco comments:    She initially quit, but has gone back to smoking a  couple cigarettes every now and then.  Substance Use Topics   Alcohol use: Yes    Comment: occasionally 3 times a year    No Known Allergies  Current Outpatient Medications  Medication Sig Dispense Refill   albuterol (VENTOLIN HFA) 108 (90 Base) MCG/ACT inhaler Inhale 2 puffs into the lungs every 6 (six) hours as needed for wheezing or shortness of breath.     alendronate (FOSAMAX) 70 MG tablet Take 70 mg by mouth once a week.     aspirin EC 81 MG tablet Take 1 tablet (81 mg total) by mouth daily. 30 tablet    atorvastatin (LIPITOR) 80 MG tablet Take 1 tablet (80 mg total) by mouth daily. 90 tablet 3   ezetimibe (ZETIA) 10 MG tablet Take 1 tablet (10 mg total) by mouth daily. 90 tablet 3   ferrous gluconate (FERGON) 324 MG tablet Take 324 mg by mouth daily with breakfast.     furosemide (LASIX) 20 MG tablet Take 20 mg  One tablet on Monday , Wednesday , Fridays , may take an additional  20 mg ff needed 90 tablet 3   isosorbide mononitrate (IMDUR) 60 MG 24 hr tablet TAKE 1 TABLET BY MOUTH  DAILY 100 tablet 3    metoprolol succinate (TOPROL-XL) 100 MG 24 hr tablet Take 2 tablets (200 mg total) by mouth at bedtime. 180 tablet 3   nitroGLYCERIN (NITROSTAT) 0.4 MG SL tablet Place 1 tablet (0.4 mg total) under the tongue every 5 (five) minutes as needed for chest pain. 25 tablet 6   ondansetron (ZOFRAN) 4 MG tablet Take 1 tablet (4 mg total) by mouth every 6 (six) hours as needed for nausea. 20 tablet 0   pantoprazole (PROTONIX) 40 MG tablet Take 1 tablet (40 mg total) by mouth daily. 30 tablet 0   valsartan (DIOVAN) 80 MG tablet Take 1 tablet (80 mg total) by mouth daily. 90 tablet 3   amLODipine (NORVASC) 5 MG tablet Take 1 tablet (5 mg total) by mouth daily. 90 tablet 3   No current facility-administered medications for this visit.    Review of Systems  Constitutional:  Constitutional negative. HENT: HENT negative.  Eyes: Eyes negative.  Respiratory: Respiratory negative.  Cardiovascular: Cardiovascular negative.  GI: Gastrointestinal negative.  Musculoskeletal: Musculoskeletal negative.  Skin: Skin negative.  Neurological: Neurological negative. Hematologic: Hematologic/lymphatic negative.  Psychiatric: Psychiatric negative.        Objective:  Objective   Vitals:   01/03/22 1605  BP: (!) 146/81  Pulse: (!) 54  Resp: 20  Temp: 97.9 F (36.6 C)  SpO2: 90%  Weight: 124 lb (56.2 kg)  Height: '4\' 11"'$  (1.499 m)   Body mass index is 25.04 kg/m.  Physical Exam HENT:     Head: Normocephalic.     Nose: Nose normal.  Eyes:     Pupils: Pupils are equal, round, and reactive to light.  Neck:     Comments: Well-healed bilateral neck incisions Cardiovascular:     Pulses:          Radial pulses are 2+ on the right side and 2+ on the left side.  Pulmonary:     Effort: Pulmonary effort is normal.  Abdominal:     General: Abdomen is flat.     Palpations: Abdomen is soft.  Musculoskeletal:     Right lower leg: No edema.     Left lower leg: No edema.  Skin:    General: Skin is warm.      Capillary  Refill: Capillary refill takes less than 2 seconds.  Neurological:     General: No focal deficit present.     Mental Status: She is alert.  Psychiatric:        Mood and Affect: Mood normal.     Data: Right Cephalic   Diameter (cm)Depth (cm)   Findings     +-----------------+-------------+----------+--------------+  Prox upper arm                          not visualized  +-----------------+-------------+----------+--------------+  Mid upper arm                           not visualized  +-----------------+-------------+----------+--------------+  Dist upper arm                          not visualized  +-----------------+-------------+----------+--------------+  Antecubital fossa 0.16 / 0.17               joins       +-----------------+-------------+----------+--------------+  Prox forearm         0.13                               +-----------------+-------------+----------+--------------+  Mid forearm          0.16                               +-----------------+-------------+----------+--------------+  Dist forearm         0.13                               +-----------------+-------------+----------+--------------+   +-----------------+-------------+----------+-------------------------------  --+  Right Basilic    Diameter (cm)Depth (cm)            Findings                +-----------------+-------------+----------+-------------------------------  --+  Prox upper arm       0.26               not visualized and prox to mid  ua  +-----------------+-------------+----------+-------------------------------  --+  Mid upper arm        0.26                                                   +-----------------+-------------+----------+-------------------------------  --+  Dist upper arm       0.16                                                    +-----------------+-------------+----------+-------------------------------  --+  Antecubital fossa    0.21                                                   +-----------------+-------------+----------+-------------------------------  --+  Prox forearm  0.13                                                   +-----------------+-------------+----------+-------------------------------  --+   +-----------------+-------------+----------+--------+  Left Cephalic    Diameter (cm)Depth (cm)Findings  +-----------------+-------------+----------+--------+  Shoulder             0.16                         +-----------------+-------------+----------+--------+  Prox upper arm       0.17                         +-----------------+-------------+----------+--------+  Mid upper arm        0.14                         +-----------------+-------------+----------+--------+  Dist upper arm       0.11                         +-----------------+-------------+----------+--------+  Antecubital fossa    0.25                         +-----------------+-------------+----------+--------+  Prox forearm         0.19                         +-----------------+-------------+----------+--------+  Mid forearm          0.17                         +-----------------+-------------+----------+--------+  Dist forearm         0.11                         +-----------------+-------------+----------+--------+   +-----------------+-------------+----------+--------+  Left Basilic     Diameter (cm)Depth (cm)Findings  +-----------------+-------------+----------+--------+  Prox upper arm       0.31                         +-----------------+-------------+----------+--------+  Mid upper arm        0.27                         +-----------------+-------------+----------+--------+  Dist upper arm       0.19                          +-----------------+-------------+----------+--------+  Antecubital fossa    0.21                         +-----------------+-------------+----------+--------+  Prox forearm         0.16                         +-----------------+-------------+----------+--------+   Right Pre-Dialysis Findings:  +-----------------------+----------+--------------------+---------+--------  ---+  Location               PSV (cm/s)Intralum. Diam. (cm)Waveform  Comments     +-----------------------+----------+--------------------+---------+--------  ---+  Brachial Antecub. [id number]  0.41                triphasic               +-----------------------+----------+--------------------+---------+--------  ---+  Radial Art at Wrist    110       0.24                triphasicthick  walls  +-----------------------+----------+--------------------+---------+--------  ---+  Ulnar Art at Wrist     64        0.19                triphasic               +-----------------------+----------+--------------------+---------+--------    Left Pre-Dialysis Findings:  +-----------------------+----------+--------------------+---------+--------  ---+  Location               PSV (cm/s)Intralum. Diam. (cm)Waveform  Comments     +-----------------------+----------+--------------------+---------+--------  ---+  Brachial Antecub. [id number]        0.37                triphasic               +-----------------------+----------+--------------------+---------+--------  ---+  Radial Art at Wrist    69        0.19                triphasicthick  walls  +-----------------------+----------+--------------------+---------+--------  ---+  Ulnar Art at Wrist     65        0.15                triphasic               +-----------------------+----------+--------------------+---------+--------  ---+        Assessment/Plan:    76 year old female history of bilateral carotid  endarterectomies now with advanced chronic kidney disease we have been asked to place a fistula or graft.  Unfortunately she does not appear to have any suitable vein and will likely require a graft in her nondominant left upper extremity in the near future.  I discussed with her the risk and benefits as well as the need for evaluation of her veins at the time of surgery and possible placement of fistula but likely need for graft.  She demonstrates good understanding we will get her scheduled in the near future.     Waynetta Sandy MD [facility]

## 2022-01-03 NOTE — H&P (View-Only) (Signed)
Patient ID: Kayla Mcclure, female   DOB: April 21, 1945, 76 y.o.   MRN: [id number]  Reason for Consult: Follow-up   Referred by Gean Quint, MD  Subjective:     HPI:  Kayla Mcclure is a 76 y.o. female history of bilateral carotid endarterectomies.  She now has advanced chronic kidney disease she has not required dialysis yet.  She is right-hand dominant.  No history of chest or breast surgery no history of pacemaker, defibrillator or port placement.  Past Medical History:  Diagnosis Date   Acute GI bleeding 08/07/2019   Asthma 10/31/2007   CAD (coronary artery disease) 08/05/2007   a) NSTEMI 2009: PCI to the RCA Promus DES 2.5 mm x 23 mm (3.0 mm). b) Myoview June 2017: Nonspecific ST changes. EF greater than 65%. LOW RISK, normal study. No ischemia or infarction.   Carotid artery disease [facility])    s/p Bilateral CEA March & May 2020.    Chronic back pain    Chronic diastolic heart failure (HCC)    CKD (chronic kidney disease), stage II    CKD (chronic kidney disease), stage III ([place])    Diverticulosis 04/11/2010   Colonoscopy   Dyslipidemia, goal LDL below 70    Essential hypertension    a. Normal renal arteries by PV angio 05/2013.   GERD (gastroesophageal reflux disease)    GI bleed 12/2015   History of kidney stones 1975   surgery to remove   History of recent blood transfusion 12/2015   12-19-15, 12-20-15 and 12-21-15   Insomnia    Internal hemorrhoids 04/11/2010   By colonoscopy   Iron deficiency anemia    Lung nodules 05/2021   bilateral   Myocardial infarction [facility]) 2009   Peripheral arterial disease ([place])    a. s/p PTA and stenting of L CIA stenosis 05/2013.   Renal insufficiency    Stroke [facility])    mini stroke   Tobacco abuse    Quit in 05/2013   Family History  Problem Relation Age of Onset   Diabetes Mother    Heart disease Mother    Heart attack Mother 42   Breast cancer Mother 39   Colon cancer Mother 78   Heart attack Father 77   Pneumonia  Father    Diabetes Sister    Breast cancer Sister    Breast cancer Sister 35   Past Surgical History:  Procedure Laterality Date   BRONCHIAL BIOPSY  06/06/2021   Procedure: BRONCHIAL BIOPSIES;  Surgeon: Garner Nash, DO;  Location: [place] [facility];  Service: Pulmonary;;   BRONCHIAL BRUSHINGS  06/06/2021   Procedure: BRONCHIAL BRUSHINGS;  Surgeon: Garner Nash, DO;  Location: [place] [facility];  Service: Pulmonary;;   BRONCHIAL NEEDLE ASPIRATION BIOPSY  06/06/2021   Procedure: BRONCHIAL NEEDLE ASPIRATION BIOPSIES;  Surgeon: Garner Nash, DO;  Location: [place] [facility];  Service: Pulmonary;;   CAPSULE ENDOSCOPY  10/27/2019   [facility]) evidence of "pinhole leaking" in both stomach and proximal small bowel; no overt bleeding noted   CHOLECYSTECTOMY  1975   COLONOSCOPY  04/11/2010   COLONOSCOPY  11/30/2015   CORONARY ANGIOPLASTY WITH STENT PLACEMENT  08/05/2007   Promus DES 2.5 mm x 23 mm (3 mm)  to RCA; EF 60-70%   ENDARTERECTOMY Left 07/28/2019   Procedure: ENDARTERECTOMY CAROTID;  Surgeon: Waynetta Sandy, MD;  Location: [place];  Service: Vascular;  Laterality: Left;   ENDARTERECTOMY Right 08/27/2019   Procedure: ENDARTERECTOMY CAROTID RIGHT WITH PATCH ANGIOPLASTY;  Surgeon: Donzetta Matters,  Georgia Dom, MD;  Location: [place];  Service: Vascular;  Laterality: Right;   ENTEROSCOPY N/A 12/27/2015   Procedure: ENTEROSCOPY;  Surgeon: Clarene Essex, MD;  Location: [facility];  Service: Endoscopy;  Laterality: N/A;  also needs slim egd scope   ENTEROSCOPY N/A 05/15/2019   Procedure: ENTEROSCOPY;  Surgeon: Ronald Lobo, MD;  Location: [place];  Service: Endoscopy;  Laterality: N/A;  Push enteroscopy using either ultraslim colonoscope or push enteroscope   ENTEROSCOPY N/A 08/08/2019   Procedure: ENTEROSCOPY;  Surgeon: Ronnette Juniper, MD;  Location: [place];  Service: Gastroenterology;  Laterality: N/A;   ESOPHAGOGASTRODUODENOSCOPY N/A 09/13/2013   Procedure: ESOPHAGOGASTRODUODENOSCOPY (EGD);   Surgeon: Lear Ng, MD;  Location: [facility] ENDOSCOPY;  Service: Endoscopy;  Laterality: N/A;   ESOPHAGOGASTRODUODENOSCOPY N/A 07/14/2015   Procedure: ESOPHAGOGASTRODUODENOSCOPY (EGD);  Surgeon: Clarene Essex, MD;  Location: [facility] ENDOSCOPY;  Service: Endoscopy;  Laterality: N/A;   ESOPHAGOGASTRODUODENOSCOPY (EGD) WITH PROPOFOL N/A 09/09/2020   Procedure: ESOPHAGOGASTRODUODENOSCOPY (EGD) WITH PROPOFOL;  Surgeon: Clarene Essex, MD;  Location: [place];  Service: Endoscopy;  Laterality: N/A;   FIDUCIAL MARKER PLACEMENT  06/06/2021   Procedure: FIDUCIAL MARKER PLACEMENT;  Surgeon: Garner Nash, DO;  Location: [place];  Service: Pulmonary;;   gi bleed  07/2015   blood given   HOT HEMOSTASIS N/A 12/27/2015   Procedure: HOT HEMOSTASIS (ARGON PLASMA COAGULATION/BICAP);  Surgeon: Clarene Essex, MD;  Location: [facility];  Service: Endoscopy;  Laterality: N/A;   HOT HEMOSTASIS N/A 08/08/2019   Procedure: HOT HEMOSTASIS (ARGON PLASMA COAGULATION/BICAP);  Surgeon: Ronnette Juniper, MD;  Location: [place];  Service: Gastroenterology;  Laterality: N/A;   HOT HEMOSTASIS N/A 09/09/2020   Procedure: HOT HEMOSTASIS (ARGON PLASMA COAGULATION/BICAP);  Surgeon: Clarene Essex, MD;  Location: [place];  Service: Endoscopy;  Laterality: N/A;   ILIAC ARTERY STENT Left 05/04/2013   L CIA-EIA -- Dr. Gwenlyn Found   LEFT HEART CATH AND CORONARY ANGIOGRAPHY N/A 02/11/2019   Procedure: LEFT HEART CATH AND CORONARY ANGIOGRAPHY;  Surgeon: Leonie Man, MD;  Location: [place] CV [facility];;-  Patent RCA stent with minimal ISR after pRCA 50%).  Small caliber RPAV with ~75%.  pLAD 30%, m-dLAD 40%, D2 40%.  Normal LVEDP and LVEF.   LOWER EXTREMITY ANGIOGRAM Bilateral 05/04/2013   Procedure: LOWER EXTREMITY ANGIOGRAM;  Surgeon: Lorretta Harp, MD;  Location: [facility] CATH LAB;  Service: Cardiovascular;  Laterality: Bilateral;   NM MYOVIEW LTD  09/07/2015   EF > 65%. Nonspecific ST changes but no ischemic changes. No ischemia or infarction.  LOW RISK   PATCH ANGIOPLASTY Left 07/28/2019   Procedure: PATCH ANGIOPLASTY USING Rueben Bash BIOLOGIC PATCH;  Surgeon: Waynetta Sandy, MD;  Location: [place];  Service: Vascular;  Laterality: Left;   TONSILLECTOMY  1960's   TRANSTHORACIC ECHOCARDIOGRAM  05/14/2019   Normal EF 60 to 65%.  No R WMA.   Normal RV but mildly elevated pressures.  Relatively normal aortic and mitral valves.   UPPER GI ENDOSCOPY  12/14/2015   VIDEO BRONCHOSCOPY WITH RADIAL ENDOBRONCHIAL ULTRASOUND  06/06/2021   Procedure: RADIAL ENDOBRONCHIAL ULTRASOUND;  Surgeon: Garner Nash, DO;  Location: [facility] ENDOSCOPY;  Service: Pulmonary;;    Short Social History:  Social History   Tobacco Use   Smoking status: Some Days    Types: Cigarettes    Last attempt to quit: 05/04/2013    Years since quitting: 8.6   Smokeless tobacco: Never   Tobacco comments:    She initially quit, but has gone back to smoking a  couple cigarettes every now and then.  Substance Use Topics   Alcohol use: Yes    Comment: occasionally 3 times a year    No Known Allergies  Current Outpatient Medications  Medication Sig Dispense Refill   albuterol (VENTOLIN HFA) 108 (90 Base) MCG/ACT inhaler Inhale 2 puffs into the lungs every 6 (six) hours as needed for wheezing or shortness of breath.     alendronate (FOSAMAX) 70 MG tablet Take 70 mg by mouth once a week.     aspirin EC 81 MG tablet Take 1 tablet (81 mg total) by mouth daily. 30 tablet    atorvastatin (LIPITOR) 80 MG tablet Take 1 tablet (80 mg total) by mouth daily. 90 tablet 3   ezetimibe (ZETIA) 10 MG tablet Take 1 tablet (10 mg total) by mouth daily. 90 tablet 3   ferrous gluconate (FERGON) 324 MG tablet Take 324 mg by mouth daily with breakfast.     furosemide (LASIX) 20 MG tablet Take 20 mg  One tablet on Monday , Wednesday , Fridays , may take an additional  20 mg ff needed 90 tablet 3   isosorbide mononitrate (IMDUR) 60 MG 24 hr tablet TAKE 1 TABLET BY MOUTH  DAILY 100 tablet 3    metoprolol succinate (TOPROL-XL) 100 MG 24 hr tablet Take 2 tablets (200 mg total) by mouth at bedtime. 180 tablet 3   nitroGLYCERIN (NITROSTAT) 0.4 MG SL tablet Place 1 tablet (0.4 mg total) under the tongue every 5 (five) minutes as needed for chest pain. 25 tablet 6   ondansetron (ZOFRAN) 4 MG tablet Take 1 tablet (4 mg total) by mouth every 6 (six) hours as needed for nausea. 20 tablet 0   pantoprazole (PROTONIX) 40 MG tablet Take 1 tablet (40 mg total) by mouth daily. 30 tablet 0   valsartan (DIOVAN) 80 MG tablet Take 1 tablet (80 mg total) by mouth daily. 90 tablet 3   amLODipine (NORVASC) 5 MG tablet Take 1 tablet (5 mg total) by mouth daily. 90 tablet 3   No current facility-administered medications for this visit.    Review of Systems  Constitutional:  Constitutional negative. HENT: HENT negative.  Eyes: Eyes negative.  Respiratory: Respiratory negative.  Cardiovascular: Cardiovascular negative.  GI: Gastrointestinal negative.  Musculoskeletal: Musculoskeletal negative.  Skin: Skin negative.  Neurological: Neurological negative. Hematologic: Hematologic/lymphatic negative.  Psychiatric: Psychiatric negative.        Objective:  Objective   Vitals:   01/03/22 1605  BP: (!) 146/81  Pulse: (!) 54  Resp: 20  Temp: 97.9 F (36.6 C)  SpO2: 90%  Weight: 124 lb (56.2 kg)  Height: '4\' 11"'$  (1.499 m)   Body mass index is 25.04 kg/m.  Physical Exam HENT:     Head: Normocephalic.     Nose: Nose normal.  Eyes:     Pupils: Pupils are equal, round, and reactive to light.  Neck:     Comments: Well-healed bilateral neck incisions Cardiovascular:     Pulses:          Radial pulses are 2+ on the right side and 2+ on the left side.  Pulmonary:     Effort: Pulmonary effort is normal.  Abdominal:     General: Abdomen is flat.     Palpations: Abdomen is soft.  Musculoskeletal:     Right lower leg: No edema.     Left lower leg: No edema.  Skin:    General: Skin is warm.      Capillary  Refill: Capillary refill takes less than 2 seconds.  Neurological:     General: No focal deficit present.     Mental Status: She is alert.  Psychiatric:        Mood and Affect: Mood normal.     Data: Right Cephalic   Diameter (cm)Depth (cm)   Findings     +-----------------+-------------+----------+--------------+  Prox upper arm                          not visualized  +-----------------+-------------+----------+--------------+  Mid upper arm                           not visualized  +-----------------+-------------+----------+--------------+  Dist upper arm                          not visualized  +-----------------+-------------+----------+--------------+  Antecubital fossa 0.16 / 0.17               joins       +-----------------+-------------+----------+--------------+  Prox forearm         0.13                               +-----------------+-------------+----------+--------------+  Mid forearm          0.16                               +-----------------+-------------+----------+--------------+  Dist forearm         0.13                               +-----------------+-------------+----------+--------------+   +-----------------+-------------+----------+-------------------------------  --+  Right Basilic    Diameter (cm)Depth (cm)            Findings                +-----------------+-------------+----------+-------------------------------  --+  Prox upper arm       0.26               not visualized and prox to mid  ua  +-----------------+-------------+----------+-------------------------------  --+  Mid upper arm        0.26                                                   +-----------------+-------------+----------+-------------------------------  --+  Dist upper arm       0.16                                                    +-----------------+-------------+----------+-------------------------------  --+  Antecubital fossa    0.21                                                   +-----------------+-------------+----------+-------------------------------  --+  Prox forearm  0.13                                                   +-----------------+-------------+----------+-------------------------------  --+   +-----------------+-------------+----------+--------+  Left Cephalic    Diameter (cm)Depth (cm)Findings  +-----------------+-------------+----------+--------+  Shoulder             0.16                         +-----------------+-------------+----------+--------+  Prox upper arm       0.17                         +-----------------+-------------+----------+--------+  Mid upper arm        0.14                         +-----------------+-------------+----------+--------+  Dist upper arm       0.11                         +-----------------+-------------+----------+--------+  Antecubital fossa    0.25                         +-----------------+-------------+----------+--------+  Prox forearm         0.19                         +-----------------+-------------+----------+--------+  Mid forearm          0.17                         +-----------------+-------------+----------+--------+  Dist forearm         0.11                         +-----------------+-------------+----------+--------+   +-----------------+-------------+----------+--------+  Left Basilic     Diameter (cm)Depth (cm)Findings  +-----------------+-------------+----------+--------+  Prox upper arm       0.31                         +-----------------+-------------+----------+--------+  Mid upper arm        0.27                         +-----------------+-------------+----------+--------+  Dist upper arm       0.19                          +-----------------+-------------+----------+--------+  Antecubital fossa    0.21                         +-----------------+-------------+----------+--------+  Prox forearm         0.16                         +-----------------+-------------+----------+--------+   Right Pre-Dialysis Findings:  +-----------------------+----------+--------------------+---------+--------  ---+  Location               PSV (cm/s)Intralum. Diam. (cm)Waveform  Comments     +-----------------------+----------+--------------------+---------+--------  ---+  Brachial Antecub. [id number]  0.41                triphasic               +-----------------------+----------+--------------------+---------+--------  ---+  Radial Art at Wrist    110       0.24                triphasicthick  walls  +-----------------------+----------+--------------------+---------+--------  ---+  Ulnar Art at Wrist     64        0.19                triphasic               +-----------------------+----------+--------------------+---------+--------    Left Pre-Dialysis Findings:  +-----------------------+----------+--------------------+---------+--------  ---+  Location               PSV (cm/s)Intralum. Diam. (cm)Waveform  Comments     +-----------------------+----------+--------------------+---------+--------  ---+  Brachial Antecub. [id number]        0.37                triphasic               +-----------------------+----------+--------------------+---------+--------  ---+  Radial Art at Wrist    69        0.19                triphasicthick  walls  +-----------------------+----------+--------------------+---------+--------  ---+  Ulnar Art at Wrist     65        0.15                triphasic               +-----------------------+----------+--------------------+---------+--------  ---+        Assessment/Plan:    76 year old female history of bilateral carotid  endarterectomies now with advanced chronic kidney disease we have been asked to place a fistula or graft.  Unfortunately she does not appear to have any suitable vein and will likely require a graft in her nondominant left upper extremity in the near future.  I discussed with her the risk and benefits as well as the need for evaluation of her veins at the time of surgery and possible placement of fistula but likely need for graft.  She demonstrates good understanding we will get her scheduled in the near future.     Waynetta Sandy MD [facility]

## 2022-01-04 ENCOUNTER — Other Ambulatory Visit: Payer: Self-pay

## 2022-01-04 ENCOUNTER — Telehealth: Payer: Self-pay | Admitting: Acute Care

## 2022-01-04 DIAGNOSIS — N185 Chronic kidney disease, stage 5: Secondary | ICD-10-CM

## 2022-01-04 DIAGNOSIS — R918 Other nonspecific abnormal finding of lung field: Secondary | ICD-10-CM

## 2022-01-04 NOTE — Telephone Encounter (Signed)
I have called the patient. We have discussed that we need to do another follow up CT Chest in October, as her 08/2021 scan showed stability, and some resolution of adenopathy. There was notation of possible chronic atypical infection, so we will discuss getting cultures at her follow up withme.  Langley Gauss, please schedule for 12/2021 CT Chest without, and follow up with me within the week after to review results and get cultures. Thanks so much

## 2022-01-04 NOTE — Telephone Encounter (Signed)
Order placed.  Will schedule OV after CT appt scheduled

## 2022-01-05 DIAGNOSIS — N390 Urinary tract infection, site not specified: Secondary | ICD-10-CM | POA: Diagnosis not present

## 2022-01-05 DIAGNOSIS — N1832 Chronic kidney disease, stage 3b: Secondary | ICD-10-CM | POA: Diagnosis not present

## 2022-01-05 DIAGNOSIS — R42 Dizziness and giddiness: Secondary | ICD-10-CM | POA: Diagnosis not present

## 2022-01-05 DIAGNOSIS — I129 Hypertensive chronic kidney disease with stage 1 through stage 4 chronic kidney disease, or unspecified chronic kidney disease: Secondary | ICD-10-CM | POA: Diagnosis not present

## 2022-01-05 DIAGNOSIS — I701 Atherosclerosis of renal artery: Secondary | ICD-10-CM | POA: Diagnosis not present

## 2022-01-05 DIAGNOSIS — N2581 Secondary hyperparathyroidism of renal origin: Secondary | ICD-10-CM | POA: Diagnosis not present

## 2022-01-08 ENCOUNTER — Encounter: Payer: Self-pay | Admitting: Cardiology

## 2022-01-08 ENCOUNTER — Ambulatory Visit (HOSPITAL_COMMUNITY): Payer: Medicare Other | Attending: Cardiology

## 2022-01-08 ENCOUNTER — Ambulatory Visit: Payer: Medicare Other | Attending: Cardiology | Admitting: Cardiology

## 2022-01-08 ENCOUNTER — Encounter (HOSPITAL_COMMUNITY): Payer: Self-pay | Admitting: *Deleted

## 2022-01-08 ENCOUNTER — Ambulatory Visit (INDEPENDENT_AMBULATORY_CARE_PROVIDER_SITE_OTHER): Payer: Medicare Other

## 2022-01-08 ENCOUNTER — Other Ambulatory Visit: Payer: Self-pay | Admitting: *Deleted

## 2022-01-08 VITALS — BP 132/58 | HR 58 | Ht 59.0 in | Wt 124.2 lb

## 2022-01-08 DIAGNOSIS — Z87891 Personal history of nicotine dependence: Secondary | ICD-10-CM | POA: Diagnosis not present

## 2022-01-08 DIAGNOSIS — Z9861 Coronary angioplasty status: Secondary | ICD-10-CM

## 2022-01-08 DIAGNOSIS — R55 Syncope and collapse: Secondary | ICD-10-CM

## 2022-01-08 DIAGNOSIS — R0609 Other forms of dyspnea: Secondary | ICD-10-CM | POA: Diagnosis not present

## 2022-01-08 DIAGNOSIS — I739 Peripheral vascular disease, unspecified: Secondary | ICD-10-CM | POA: Diagnosis not present

## 2022-01-08 DIAGNOSIS — I25119 Atherosclerotic heart disease of native coronary artery with unspecified angina pectoris: Secondary | ICD-10-CM

## 2022-01-08 DIAGNOSIS — I200 Unstable angina: Secondary | ICD-10-CM | POA: Diagnosis not present

## 2022-01-08 DIAGNOSIS — K219 Gastro-esophageal reflux disease without esophagitis: Secondary | ICD-10-CM | POA: Diagnosis not present

## 2022-01-08 DIAGNOSIS — I5032 Chronic diastolic (congestive) heart failure: Secondary | ICD-10-CM

## 2022-01-08 DIAGNOSIS — N185 Chronic kidney disease, stage 5: Secondary | ICD-10-CM

## 2022-01-08 DIAGNOSIS — I2510 Atherosclerotic heart disease of native coronary artery without angina pectoris: Secondary | ICD-10-CM | POA: Insufficient documentation

## 2022-01-08 DIAGNOSIS — I252 Old myocardial infarction: Secondary | ICD-10-CM | POA: Diagnosis not present

## 2022-01-08 DIAGNOSIS — I10 Essential (primary) hypertension: Secondary | ICD-10-CM | POA: Diagnosis not present

## 2022-01-08 DIAGNOSIS — E785 Hyperlipidemia, unspecified: Secondary | ICD-10-CM

## 2022-01-08 DIAGNOSIS — J4530 Mild persistent asthma, uncomplicated: Secondary | ICD-10-CM | POA: Diagnosis not present

## 2022-01-08 LAB — ECHOCARDIOGRAM COMPLETE
Area-P 1/2: 4.21 cm2
Height: 59 in
P 1/2 time: 551 msec
S' Lateral: 2.1 cm
Weight: 1987.2 oz

## 2022-01-08 NOTE — Progress Notes (Unsigned)
Primary Care Provider: Lois Huxley, [place] Cardiologist: [name], MD [place] Cardiologist: Dr. Gwenlyn Found ;  Vasc Sgx: Dr. Donzetta Matters Electrophysiologist: None  Clinic Note: No chief complaint on file.  ===================================  ASSESSMENT/PLAN   Problem List Items Addressed This Visit       Cardiology Problems   CAD S/P percutaneous coronary angioplasty -- Promus DES 2.5 mm x 20 mm postdilated to 3 mm (Chronic)   Coronary artery disease involving native coronary artery of native heart with angina pectoris (HCC) - Primary (Chronic)   Dyslipidemia, goal LDL below 70 (Chronic)   Essential hypertension, benign (Chronic)   PAD (peripheral artery disease) - bilateral CIA & CFA 50-69%; Claudication (Chronic)   Chronic diastolic CHF (congestive heart failure) (HCC) (Chronic)     Other   DOE (dyspnea on exertion)   History of MI (myocardial infarction)  - NSTEMI with PCI of RCA. (Chronic)   Former heavy cigarette smoker (20-39 per day) (Chronic)   Syncope   ===================================  HPI:    Kayla Mcclure is a 76 y.o. female with a PMH below who presents today for annual.  CAD (progressive angina)-DES PCI RCA (2009) PAD: Bilateral Common Iliac and Common Femoral Arteries 50 to 69% Dopplers.  Clinical claudication (Dr. Gwenlyn Found) Also bilateral carotid disease HTN HLD Former heavy smoker with chronic dyspnea   Kayla Mcclure was last seen on January 20, 2001 via telemedicine.  She had noted about a 16 pound weight loss.  Stable cardiac symptoms.  No active angina.  Recent Hospitalizations: ***  Reviewed  CV studies:    The following studies were reviewed today: (if available, images/films reviewed: From Epic Chart or Care Everywhere) RA Dopplers Carotid Dopplers:  Interval History:   Kayla Mcclure Needs   CV Review of Symptoms (Summary) Cardiovascular ROS: {roscv:310661}  REVIEWED OF SYSTEMS   ROS  I have  reviewed and (if needed) personally updated the patient's problem list, medications, allergies, past medical and surgical history, social and family history.   PAST MEDICAL HISTORY   Past Medical History:  Diagnosis Date   Acute GI bleeding 08/07/2019   Asthma 10/31/2007   CAD (coronary artery disease) 08/05/2007   a) NSTEMI 2009: PCI to the RCA Promus DES 2.5 mm x 23 mm (3.0 mm). b) Myoview June 2017: Nonspecific ST changes. EF greater than 65%. LOW RISK, normal study. No ischemia or infarction.   Carotid artery disease [facility])    s/p Bilateral CEA March & May 2020.    Chronic back pain    Chronic diastolic heart failure (HCC)    CKD (chronic kidney disease), stage II    CKD (chronic kidney disease), stage III ([place])    Diverticulosis 04/11/2010   Colonoscopy   Dyslipidemia, goal LDL below 70    Essential hypertension    a. Normal renal arteries by PV angio 05/2013.   GERD (gastroesophageal reflux disease)    GI bleed 12/2015   History of kidney stones 1975   surgery to remove   History of recent blood transfusion 12/2015   12-19-15, 12-20-15 and 12-21-15   Insomnia    Internal hemorrhoids 04/11/2010   By colonoscopy   Iron deficiency anemia    Lung nodules 05/2021   bilateral   Myocardial infarction [facility]) 2009   Peripheral arterial disease ([place])    a. s/p PTA and stenting of L CIA stenosis 05/2013.   Renal insufficiency    Stroke [facility])    mini stroke   Tobacco  abuse    Quit in 05/2013    PAST SURGICAL HISTORY   Past Surgical History:  Procedure Laterality Date   BRONCHIAL BIOPSY  06/06/2021   Procedure: BRONCHIAL BIOPSIES;  Surgeon: Garner Nash, DO;  Location: [place] ENDOSCOPY;  Service: Pulmonary;;   BRONCHIAL BRUSHINGS  06/06/2021   Procedure: BRONCHIAL BRUSHINGS;  Surgeon: Garner Nash, DO;  Location: [place] ENDOSCOPY;  Service: Pulmonary;;   BRONCHIAL NEEDLE ASPIRATION BIOPSY  06/06/2021   Procedure: BRONCHIAL NEEDLE ASPIRATION BIOPSIES;  Surgeon: Garner Nash, DO;   Location: [place] ENDOSCOPY;  Service: Pulmonary;;   CAPSULE ENDOSCOPY  10/27/2019   [facility]) evidence of "pinhole leaking" in both stomach and proximal small bowel; no overt bleeding noted   CHOLECYSTECTOMY  1975   COLONOSCOPY  04/11/2010   COLONOSCOPY  11/30/2015   CORONARY ANGIOPLASTY WITH STENT PLACEMENT  08/05/2007   Promus DES 2.5 mm x 23 mm (3 mm)  to RCA; EF 60-70%   ENDARTERECTOMY Left 07/28/2019   Procedure: ENDARTERECTOMY CAROTID;  Surgeon: Waynetta Sandy, MD;  Location: [place];  Service: Vascular;  Laterality: Left;   ENDARTERECTOMY Right 08/27/2019   Procedure: ENDARTERECTOMY CAROTID RIGHT WITH PATCH ANGIOPLASTY;  Surgeon: Waynetta Sandy, MD;  Location: [place];  Service: Vascular;  Laterality: Right;   ENTEROSCOPY N/A 12/27/2015   Procedure: ENTEROSCOPY;  Surgeon: Clarene Essex, MD;  Location: [facility];  Service: Endoscopy;  Laterality: N/A;  also needs slim egd scope   ENTEROSCOPY N/A 05/15/2019   Procedure: ENTEROSCOPY;  Surgeon: Ronald Lobo, MD;  Location: [place];  Service: Endoscopy;  Laterality: N/A;  Push enteroscopy using either ultraslim colonoscope or push enteroscope   ENTEROSCOPY N/A 08/08/2019   Procedure: ENTEROSCOPY;  Surgeon: Ronnette Juniper, MD;  Location: [place];  Service: Gastroenterology;  Laterality: N/A;   ESOPHAGOGASTRODUODENOSCOPY N/A 09/13/2013   Procedure: ESOPHAGOGASTRODUODENOSCOPY (EGD);  Surgeon: Lear Ng, MD;  Location: [facility] ENDOSCOPY;  Service: Endoscopy;  Laterality: N/A;   ESOPHAGOGASTRODUODENOSCOPY N/A 07/14/2015   Procedure: ESOPHAGOGASTRODUODENOSCOPY (EGD);  Surgeon: Clarene Essex, MD;  Location: [facility] ENDOSCOPY;  Service: Endoscopy;  Laterality: N/A;   ESOPHAGOGASTRODUODENOSCOPY (EGD) WITH PROPOFOL N/A 09/09/2020   Procedure: ESOPHAGOGASTRODUODENOSCOPY (EGD) WITH PROPOFOL;  Surgeon: Clarene Essex, MD;  Location: [place];  Service: Endoscopy;  Laterality: N/A;   FIDUCIAL MARKER PLACEMENT  06/06/2021   Procedure: FIDUCIAL  MARKER PLACEMENT;  Surgeon: Garner Nash, DO;  Location: [place];  Service: Pulmonary;;   gi bleed  07/2015   blood given   HOT HEMOSTASIS N/A 12/27/2015   Procedure: HOT HEMOSTASIS (ARGON PLASMA COAGULATION/BICAP);  Surgeon: Clarene Essex, MD;  Location: [facility];  Service: Endoscopy;  Laterality: N/A;   HOT HEMOSTASIS N/A 08/08/2019   Procedure: HOT HEMOSTASIS (ARGON PLASMA COAGULATION/BICAP);  Surgeon: Ronnette Juniper, MD;  Location: [place];  Service: Gastroenterology;  Laterality: N/A;   HOT HEMOSTASIS N/A 09/09/2020   Procedure: HOT HEMOSTASIS (ARGON PLASMA COAGULATION/BICAP);  Surgeon: Clarene Essex, MD;  Location: [place];  Service: Endoscopy;  Laterality: N/A;   ILIAC ARTERY STENT Left 05/04/2013   L CIA-EIA -- Dr. Gwenlyn Found   LEFT HEART CATH AND CORONARY ANGIOGRAPHY N/A 02/11/2019   Procedure: LEFT HEART CATH AND CORONARY ANGIOGRAPHY;  Surgeon: Leonie Man, MD;  Location: [place] CV LAB;;-  Patent RCA stent with minimal ISR after pRCA 50%).  Small caliber RPAV with ~75%.  pLAD 30%, m-dLAD 40%, D2 40%.  Normal LVEDP and LVEF.   LOWER EXTREMITY ANGIOGRAM Bilateral 05/04/2013   Procedure: LOWER EXTREMITY ANGIOGRAM;  Surgeon:  Lorretta Harp, MD;  Location: [facility] CATH LAB;  Service: Cardiovascular;  Laterality: Bilateral;   NM MYOVIEW LTD  09/07/2015   EF > 65%. Nonspecific ST changes but no ischemic changes. No ischemia or infarction. LOW RISK   PATCH ANGIOPLASTY Left 07/28/2019   Procedure: PATCH ANGIOPLASTY USING Rueben Bash BIOLOGIC PATCH;  Surgeon: Waynetta Sandy, MD;  Location: [place];  Service: Vascular;  Laterality: Left;   TONSILLECTOMY  1960's   TRANSTHORACIC ECHOCARDIOGRAM  05/14/2019   Normal EF 60 to 65%.  No R WMA.   Normal RV but mildly elevated pressures.  Relatively normal aortic and mitral valves.   UPPER GI ENDOSCOPY  12/14/2015   VIDEO BRONCHOSCOPY WITH RADIAL ENDOBRONCHIAL ULTRASOUND  06/06/2021   Procedure: RADIAL ENDOBRONCHIAL ULTRASOUND;  Surgeon:  Garner Nash, DO;  Location: [place] ENDOSCOPY;  Service: Pulmonary;;    Immunization History  Administered Date(s) Administered   Influenza Whole 02/26/2007, 02/04/2009, 01/10/2010   Influenza, High Dose Seasonal PF 12/25/2013, 03/24/2015, 01/02/2016, 03/06/2017, 01/09/2018, 01/14/2019, 01/15/2020, 01/25/2021   Influenza, Seasonal, Injecte, Preservative Fre 06/09/2012   Influenza-Unspecified 12/01/2012   PFIZER(Purple Top)SARS-COV-2 Vaccination 06/23/2019, 07/14/2019, 01/11/2020, 07/12/2020   Pfizer Covid-19 Vaccine Bivalent Booster 63yr & up 12/20/2020   Pneumococcal Conjugate-13 09/11/2016   Pneumococcal Polysaccharide-23 04/02/2005, 06/09/2012, 09/12/2013, 01/09/2018   Td 12/21/2008    MEDICATIONS/ALLERGIES   Current Meds  Medication Sig   albuterol (VENTOLIN HFA) 108 (90 Base) MCG/ACT inhaler Inhale 2 puffs into the lungs every 6 (six) hours as needed for wheezing or shortness of breath.   alendronate (FOSAMAX) 70 MG tablet Take 70 mg by mouth once a week.   aspirin EC 81 MG tablet Take 1 tablet (81 mg total) by mouth daily.   atorvastatin (LIPITOR) 80 MG tablet Take 1 tablet (80 mg total) by mouth daily.   ezetimibe (ZETIA) 10 MG tablet Take 1 tablet (10 mg total) by mouth daily.   ferrous gluconate (FERGON) 324 MG tablet Take 324 mg by mouth daily with breakfast.   furosemide (LASIX) 20 MG tablet Take 20 mg  One tablet on Monday , Wednesday , Fridays , may take an additional  20 mg ff needed   isosorbide mononitrate (IMDUR) 60 MG 24 hr tablet TAKE 1 TABLET BY MOUTH  DAILY   metoprolol succinate (TOPROL-XL) 100 MG 24 hr tablet Take 2 tablets (200 mg total) by mouth at bedtime.   nitroGLYCERIN (NITROSTAT) 0.4 MG SL tablet Place 1 tablet (0.4 mg total) under the tongue every 5 (five) minutes as needed for chest pain.   ondansetron (ZOFRAN) 4 MG tablet Take 1 tablet (4 mg total) by mouth every 6 (six) hours as needed for nausea.   pantoprazole (PROTONIX) 40 MG tablet Take 1 tablet  (40 mg total) by mouth daily.   valsartan (DIOVAN) 80 MG tablet Take 1 tablet (80 mg total) by mouth daily.    No Known Allergies  SOCIAL HISTORY/FAMILY HISTORY   Reviewed in Epic:  Pertinent findings:  Social History   Tobacco Use   Smoking status: Some Days    Types: Cigarettes    Last attempt to quit: 05/04/2013    Years since quitting: 8.6   Smokeless tobacco: Never   Tobacco comments:    She initially quit, but has gone back to smoking a couple cigarettes every now and then.  Vaping Use   Vaping Use: Never used  Substance Use Topics   Alcohol use: Yes    Comment: occasionally 3 times a year   Drug  use: No   Social History   Social History Narrative   Married. Has one child. Grandmother for a great-grandmother and 1.   No real exercise.   Occasional alcohol consumption.   Quit smoking inf Feb 2015 -- after L Iliac Stent   Right-handed   Caffeine: 1 cup every morning   Right-handed.   Lives at home with husband.    OBJCTIVE -PE, EKG, labs   Wt Readings from Last 3 Encounters:  01/08/22 124 lb 3.2 oz (56.3 kg)  01/03/22 124 lb (56.2 kg)  07/05/21 125 lb 6.4 oz (56.9 kg)   Physical Exam: BP (!) 132/58   Pulse (!) 58   Ht '4\' 11"'$  (1.499 m)   Wt 124 lb 3.2 oz (56.3 kg)   BMI 25.09 kg/m  Physical Exam   Adult ECG Report  Rate: *** ;  Rhythm: {rhythm:17366};   Narrative Interpretation: ***  Recent Labs:  ***  Lab Results  Component Value Date   CHOL 91 05/15/2019   HDL 45 05/15/2019   LDLCALC 35 05/15/2019   TRIG 55 05/15/2019   CHOLHDL 2.0 05/15/2019   Lab Results  Component Value Date   CREATININE 1.14 (H) 06/06/2021   BUN 17 06/06/2021   NA 140 06/06/2021   K 5.0 06/06/2021   CL 107 06/06/2021   CO2 24 06/06/2021      Latest Ref Rng & Units 06/06/2021    1:02 PM 09/09/2020   12:15 AM 09/08/2020   12:46 PM  CBC  WBC 4.0 - 10.5 K/uL 6.4  6.0  6.5   Hemoglobin 12.0 - 15.0 g/dL 12.8  11.0  12.5   Hematocrit 36.0 - 46.0 % 40.6  33.9  38.2    Platelets 150 - 400 K/uL 165  134  146     Lab Results  Component Value Date   HGBA1C 6.4 (H) 05/15/2019   Lab Results  Component Value Date   TSH 2.161 04/28/2013    ================================================== I spent a total of ***minutes with the patient spent in direct patient consultation.  Additional time spent with chart review  / charting (studies, outside notes, etc): *** min Total Time: *** min  Current medicines are reviewed at length with the patient today.  (+/- concerns) ***  Notice: This dictation was prepared with Dragon dictation along with smart phrase technology. Any transcriptional errors that result from this process are unintentional and may not be corrected upon review.  Studies Ordered:   No orders of the defined types were placed in this encounter.  No orders of the defined types were placed in this encounter.  Shared Decision Making/Informed Consent The risks [chest pain, shortness of breath, cardiac arrhythmias, dizziness, blood pressure fluctuations, myocardial infarction, stroke/transient ischemic attack, nausea, vomiting, allergic reaction, radiation exposure, metallic taste sensation and life-threatening complications (estimated to be 1 in 10,000)], benefits (risk stratification, diagnosing coronary artery disease, treatment guidance) and alternatives of a nuclear stress test were discussed in detail with Ms. Kalish and she agrees to proceed.   Patient Instructions / Medication Changes & Studies & Tests Ordered   There are no Patient Instructions on file for this visit.    Leonie Man, MD, MS [name], M.D., M.S. Interventional Cardiologist  [place]  Pager # [phone] Phone # [phone] [address]. [place], [place] [address]   Thank you for choosing [facility] [place]!!

## 2022-01-08 NOTE — Progress Notes (Unsigned)
Enrolled for [employer] to mail a ZIO AT Live Telemetry monitor to patients address on file.  

## 2022-01-08 NOTE — Patient Instructions (Addendum)
Medication Instructions:  No changes  *If you need a refill on your cardiac medications before your next appointment, please call your pharmacy*   Lab Work: Not needed    Testing/Procedures:  Both Will be schedule at [facility] [address] 1-Your physician has requested that you have an echocardiogram. Echocardiography is a painless test that uses sound waves to create images of your heart. It provides your doctor with information about the size and shape of your heart and how well your heart's chambers and valves are working. This procedure takes approximately one hour. There are no restrictions for this procedure. And 2-Your physician has requested that you have a lexiscan myoview.  Please follow instruction sheet, as given.    3- Will be mailed to you in 3 to 5 daysto your home Your physician has recommended that you wear a holter monitor 14 days. Holter monitors are medical devices that record the heart's electrical activity. Doctors most often use these monitors to diagnose arrhythmias. Arrhythmias are problems with the speed or rhythm of the heartbeat. The monitor is a small, portable device. You can wear one while you do your normal daily activities. This is usually used to diagnose what is causing palpitations/syncope (passing out).    Follow-Up: At [facility], you and your health needs are our priority.  As part of our continuing mission to provide you with exceptional heart care, we have created designated Provider Care Teams.  These Care Teams include your primary Cardiologist (physician) and Advanced Practice Providers (APPs -  Physician Assistants and Nurse Practitioners) who all work together to provide you with the care you need, when you need it.     Your next appointment:   3 to 4week(s)  The format for your next appointment:   In Person  Provider:   [name], MD    Other Instructions  [id number]   Your doctor has scheduled you for a  Myocardial Perfusion scan is to obtain information about the blood flow to your heart. The test consists of taking pictures of your heart in two phases: while resting and after a stress test.  The stress test  involves  you being given a drug intended to have a similar effect of you walking on the heart for the exercise portion.  The test will take approximately 3 to 4  hours to complete.    How to prepare for your test: Do not eat or drink 2 hours prior to your test Do not consume products containing caffeine 12 hours prior to your test (examples: coffee (regular OR decaf), chocolate, sodas, tea) Your doctor may need you to hold certain medications prior to the test.  If so, these are listed below and should not be taken for 24 hours prior to the test.  If not listed below, you may take your medications as normal.  You may resume taking held medications on your normal schedule once the test is complete.   Meds to hold: none Do bring a list of your current medications with you.  If you have held any meds in preparation for the test, please bring them, as you may be required to take them once the test is completed. Do wear comfortable clothes and walking shoes.  Do not wear dresses or overalls. Do NOT wear cologne, perfume, aftershave, or fragranced lotions the day of your test (deodorants okay). If these instructions are not followed your test will have to be rescheduled.   A nuclear cardiologist will  review your test, prepare a report and send it to your physician.   If you have questions or concerns about your appointment, you can call the Nuclear Cardiology department at [phone]. If you cannot keep your appointment, please provide 48 hours notification to avoid a possible $50.00 charge to your account.   Please arrive 15 minutes prior to your appointment time for registration and insurance purposes       ZIO AT Long term monitor-Live Telemetry  Your physician has requested you wear  a ZIO patch monitor for 14 days.  This is a single patch monitor. [employer] supplies one patch monitor per enrollment. Additional  stickers are not available.  Please do not apply patch if you will be having a Nuclear Stress Test, Echocardiogram, Cardiac CT, MRI,  or Chest Xray during the period you would be wearing the monitor. The patch cannot be worn during  these tests. You cannot remove and re-apply the ZIO AT patch monitor.  Your ZIO patch monitor will be mailed 3 day USPS to your address on file. It may take 3-5 days to  receive your monitor after you have been enrolled.  Once you have received your monitor, please review the enclosed instructions. Your monitor has  already been registered assigning a specific monitor serial # to you.   Billing and Patient Assistance Program information  Kayla Mcclure has been supplied with any insurance information on record for billing. [employer] offers a sliding scale Patient Assistance Program for patients without insurance, or whose  insurance does not completely cover the cost of the ZIO patch monitor. You must apply for the  Patient Assistance Program to qualify for the discounted rate. To apply, call [employer] at [phone],  select option 4, select option 2 , ask to apply for the Patient Assistance Program, (you can request an  interpreter if needed). [employer] will ask your household income and how many people are in your  household. [employer] will quote your out-of-pocket cost based on this information. They will also be able  to set up a 12 month interest free payment plan if needed.  Applying the monitor   Shave hair from upper left chest.  Hold the abrader disc by orange tab. Rub the abrader in 40 strokes over left upper chest as indicated in  your monitor instructions.  Clean area with 4 enclosed alcohol pads. Use all pads to ensure the area is cleaned thoroughly. Let  dry.  Apply patch as indicated in monitor instructions. Patch will be placed  under collarbone on left side of  chest with arrow pointing upward.  Rub patch adhesive wings for 2 minutes. Remove the white label marked "1". Remove the white label  marked "2". Rub patch adhesive wings for 2 additional minutes.  While looking in a mirror, press and release button in center of patch. A small green light will flash 3-4  times. This will be your only indicator that the monitor has been turned on.  Do not shower for the first 24 hours. You may shower after the first 24 hours.  Press the button if you feel a symptom. You will hear a small click. Record Date, Time and Symptom in  the Patient Log.   Starting the Gateway  In your kit there is a Hydrographic surveyor box the size of a cellphone. This is Airline pilot. It transmits all your  recorded data to [facility]. This box must always stay within 10 feet of you. Open the box and push  the *  button. There will be a light that blinks orange and then green a few times. When the light stops  blinking, the Gateway is connected to the ZIO patch. Call [employer] at [phone] to confirm your monitor is transmitting.  Returning your monitor  Remove your patch and place it inside the [place]. In the lower half of the Gateway there is a white  bag with prepaid postage on it. Place Gateway in bag and seal. Mail package back to [place] as soon as  possible. Your physician should have your final report approximately 7 days after you have mailed back  your monitor. Call [facility] at [phone] if you have questions regarding your ZIO AT  patch monitor. Call them immediately if you see an orange light blinking on your monitor.  If your monitor falls off in less than 4 days, contact our Monitor department at [phone]. If your  monitor becomes loose or falls off after 4 days call [employer] at [phone] for suggestions on  securing your monitor

## 2022-01-10 ENCOUNTER — Encounter: Payer: Self-pay | Admitting: Cardiology

## 2022-01-10 NOTE — Assessment & Plan Note (Signed)
BP is okay today.  She is on multiple meds including Toprol 200 mg nightly, valsartan 80 mg daily, amlodipine 5 mg daily, Imdur 60 mg daily and Monday Wednesday Friday Lasix.

## 2022-01-10 NOTE — Assessment & Plan Note (Signed)
Distant history of MI with RCA PCI that was patent by cath 3 years ago.  She did have some distal vessel disease that seem to be relatively small caliber and not worth PCI.  With her having progressively worsening dyspnea, need to make sure that there is no signs of reduced EF.  Plan, in addition to checking [place] we will also check 2D echo.

## 2022-01-10 NOTE — Assessment & Plan Note (Signed)
Lipids well controlled on high-dose atorvastatin and Zetia-based on April labs..  Does not seem to be having any myalgias symptoms with high-dose atorvastatin.  For now continue current meds.

## 2022-01-10 NOTE — Assessment & Plan Note (Signed)
Coronary artery disease reviewed.  Most recent cath was in 2020 (3 years ago) showing patent stent with 75% stenosis in the small PAD branch.  Unlikely that this is causing her symptoms but she is now having pretty significant [place] symptoms of exertional chest pain and dyspnea.  With her worsening renal function I would really like to try to avoid catheterization if at all possible.  We talked about the fact that she had no symptoms every time she does activity and symptoms do not always occur with consistency.  However, we do need to have an ischemic evaluation.  Hopefully with her weight loss we will get a decent result from a Myoview.  Plan:   Check 2D echocardiogram and Lexiscan Myoview Stress Test  Although we are evaluating the symptoms, this should not preclude her from having her abdominal angiogram performed.  In fact potentially improving renal function would help [place] further evaluate and treat her active symptoms at this point.  Lipids look well controlled as of April on current dose of atorvastatin.  She remains on aspirin but no longer on Thienopyridine.  She is already on Imdur and high-dose Toprol along with medium dose amlodipine.  She does have room to titrate amlodipine up to 10 mg daily which would be next option for antianginal benefit.  Also on modest dose valsartan along with 3 days a week Lasix.

## 2022-01-10 NOTE — Assessment & Plan Note (Signed)
She seems euvolemic on exam and I am leery of adjusting diuretic with her nephrologist being concerned about the need for potential dialysis catheter.  Will defer diuretic to nephrology.  She is on the good standing dose of ARB and Toprol, along with modest dose amlodipine.Marland Kitchen

## 2022-01-10 NOTE — Assessment & Plan Note (Signed)
Strange episode that she described where she "blacked out enough to of falling down.  Did not sound like she is having any of the concerning symptoms for arrhythmia nor did seem like she had a seizure.  She just had carotid Dopplers done and we are checking 2D echocardiogram as well therefore we will also check a Zio patch monitor to ensure no arrhythmias or pauses.

## 2022-01-10 NOTE — Assessment & Plan Note (Signed)
Hard to tell what her symptoms are related to at this point.  Does not appear to be volume overloaded however we will go and check an echocardiogram.  The chest pain symptoms are also concerning for ischemia so we will therefore order a Lexiscan Myoview as well.

## 2022-01-10 NOTE — Assessment & Plan Note (Signed)
I am little worried that her symptoms may be consistent with progressive angina, but is mentioned elsewhere, I am also concerned about her renal function and would want to try to avoid any excess contrast especially with her upcoming abdominal angiogram scheduled.  As such, we can evaluate with a Myoview to determine if there is a need for invasive testing.

## 2022-01-15 ENCOUNTER — Ambulatory Visit (HOSPITAL_COMMUNITY)
Admission: RE | Admit: 2022-01-15 | Discharge: 2022-01-15 | Disposition: A | Discharge status: Home / Self Care | Payer: Medicare Other | Attending: Vascular Surgery | Admitting: Vascular Surgery

## 2022-01-15 ENCOUNTER — Ambulatory Visit (HOSPITAL_COMMUNITY)
Admission: RE | Disposition: A | Discharge status: Home / Self Care | Payer: Self-pay | Source: Home / Self Care | Attending: Vascular Surgery

## 2022-01-15 ENCOUNTER — Other Ambulatory Visit: Payer: Self-pay

## 2022-01-15 DIAGNOSIS — Z87891 Personal history of nicotine dependence: Secondary | ICD-10-CM | POA: Insufficient documentation

## 2022-01-15 DIAGNOSIS — I701 Atherosclerosis of renal artery: Secondary | ICD-10-CM | POA: Insufficient documentation

## 2022-01-15 DIAGNOSIS — N185 Chronic kidney disease, stage 5: Secondary | ICD-10-CM

## 2022-01-15 DIAGNOSIS — N186 End stage renal disease: Secondary | ICD-10-CM | POA: Diagnosis not present

## 2022-01-15 HISTORY — PX: RENAL ANGIOGRAPHY: CATH118260

## 2022-01-15 LAB — POCT ACTIVATED CLOTTING TIME
Activated Clotting Time: 239 seconds
Activated Clotting Time: 203 seconds
Activated Clotting Time: 173 seconds

## 2022-01-15 LAB — POCT I-STAT, CHEM 8
BUN: 38 mg/dL — ABNORMAL HIGH (ref 8–23)
Calcium, Ion: 1.19 mmol/L (ref 1.15–1.40)
Chloride: 104 mmol/L (ref 98–111)
Creatinine, Ser: 1.4 mg/dL — ABNORMAL HIGH (ref 0.44–1.00)
Glucose, Bld: 102 mg/dL — ABNORMAL HIGH (ref 70–99)
HCT: 39 % (ref 36.0–46.0)
Hemoglobin: 13.3 g/dL (ref 12.0–15.0)
Potassium: 5 mmol/L (ref 3.5–5.1)
Sodium: 140 mmol/L (ref 135–145)
TCO2: 30 mmol/L (ref 22–32)

## 2022-01-15 SURGERY — RENAL ANGIOGRAPHY
Anesthesia: LOCAL

## 2022-01-15 MED ORDER — HEPARIN SODIUM (PORCINE) 1000 UNIT/ML IJ SOLN
INTRAMUSCULAR | Status: DC | PRN
  Administered 2022-01-15: 5000 [IU] via INTRAVENOUS

## 2022-01-15 MED ORDER — OXYCODONE HCL 5 MG PO TABS: 5.0000 mg | ORAL_TABLET | ORAL | Status: DC | PRN

## 2022-01-15 MED ORDER — MIDAZOLAM HCL 2 MG/2ML IJ SOLN
INTRAMUSCULAR | Status: AC
  Filled 2022-01-15: qty 2

## 2022-01-15 MED ORDER — FENTANYL CITRATE (PF) 100 MCG/2ML IJ SOLN
INTRAMUSCULAR | Status: DC | PRN
  Administered 2022-01-15: 50 ug via INTRAVENOUS

## 2022-01-15 MED ORDER — SODIUM CHLORIDE 0.9% FLUSH: 3.0000 mL | Freq: Two times a day (BID) | INTRAVENOUS | Status: DC

## 2022-01-15 MED ORDER — CHLORHEXIDINE GLUCONATE 4 % EX LIQD: 60.0000 mL | Freq: Once | CUTANEOUS | Status: DC

## 2022-01-15 MED ORDER — MIDAZOLAM HCL 2 MG/2ML IJ SOLN
INTRAMUSCULAR | Status: DC | PRN
  Administered 2022-01-15: 1 mg via INTRAVENOUS

## 2022-01-15 MED ORDER — ACETAMINOPHEN 325 MG PO TABS
650.0000 mg | ORAL_TABLET | ORAL | Status: DC | PRN
  Administered 2022-01-15: 650 mg via ORAL
  Filled 2022-01-15: qty 2

## 2022-01-15 MED ORDER — SODIUM CHLORIDE 0.9% FLUSH: 3.0000 mL | INTRAVENOUS | Status: DC | PRN

## 2022-01-15 MED ORDER — SODIUM CHLORIDE 0.9 % WEIGHT BASED INFUSION: 1.0000 mL/kg/h | INTRAVENOUS | Status: DC

## 2022-01-15 MED ORDER — HEPARIN (PORCINE) IN NACL 1000-0.9 UT/500ML-% IV SOLN
INTRAVENOUS | Status: AC
  Filled 2022-01-15: qty 500

## 2022-01-15 MED ORDER — CLOPIDOGREL BISULFATE 300 MG PO TABS
ORAL_TABLET | ORAL | Status: AC
  Filled 2022-01-15: qty 1

## 2022-01-15 MED ORDER — LIDOCAINE HCL (PF) 1 % IJ SOLN
INTRAMUSCULAR | Status: AC
  Filled 2022-01-15: qty 30

## 2022-01-15 MED ORDER — CLOPIDOGREL BISULFATE 75 MG PO TABS: 75.0000 mg | ORAL_TABLET | Freq: Every day | ORAL | Status: DC

## 2022-01-15 MED ORDER — CLOPIDOGREL BISULFATE 300 MG PO TABS
ORAL_TABLET | ORAL | Status: DC | PRN
  Administered 2022-01-15: 300 mg via ORAL

## 2022-01-15 MED ORDER — IODIXANOL 320 MG/ML IV SOLN
INTRAVENOUS | Status: DC | PRN
  Administered 2022-01-15: 70 mL via INTRA_ARTERIAL

## 2022-01-15 MED ORDER — FENTANYL CITRATE (PF) 100 MCG/2ML IJ SOLN
INTRAMUSCULAR | Status: AC
  Filled 2022-01-15: qty 2

## 2022-01-15 MED ORDER — SODIUM CHLORIDE 0.9 % IV SOLN: INTRAVENOUS | Status: DC

## 2022-01-15 MED ORDER — LIDOCAINE HCL (PF) 1 % IJ SOLN
INTRAMUSCULAR | Status: DC | PRN
  Administered 2022-01-15: 20 mL via INTRADERMAL

## 2022-01-15 MED ORDER — SODIUM CHLORIDE 0.9 % IV SOLN: 250.0000 mL | INTRAVENOUS | Status: DC | PRN

## 2022-01-15 MED ORDER — HEPARIN SODIUM (PORCINE) 1000 UNIT/ML IJ SOLN
INTRAMUSCULAR | Status: AC
  Filled 2022-01-15: qty 10

## 2022-01-15 MED ORDER — MORPHINE SULFATE (PF) 2 MG/ML IV SOLN: 2.0000 mg | INTRAVENOUS | Status: DC | PRN

## 2022-01-15 MED ORDER — CLOPIDOGREL BISULFATE 75 MG PO TABS: 300.0000 mg | ORAL_TABLET | Freq: Once | ORAL | Status: DC

## 2022-01-15 MED ORDER — HYDRALAZINE HCL 20 MG/ML IJ SOLN: 5.0000 mg | INTRAMUSCULAR | Status: DC | PRN

## 2022-01-15 MED ORDER — LABETALOL HCL 5 MG/ML IV SOLN: 10.0000 mg | INTRAVENOUS | Status: DC | PRN

## 2022-01-15 MED ORDER — CEFAZOLIN SODIUM-DEXTROSE 2-4 GM/100ML-% IV SOLN: 2.0000 g | INTRAVENOUS | Status: DC

## 2022-01-15 MED ORDER — ONDANSETRON HCL 4 MG/2ML IJ SOLN: 4.0000 mg | Freq: Four times a day (QID) | INTRAMUSCULAR | Status: DC | PRN

## 2022-01-15 MED ORDER — CLOPIDOGREL BISULFATE 75 MG PO TABS: 75.0000 mg | ORAL_TABLET | Freq: Every day | ORAL | 3 refills | Status: DC

## 2022-01-15 SURGICAL SUPPLY — 15 items
CATH OMNI FLUSH 5F 65CM (CATHETERS) IMPLANT
GUIDE CATH VISTA IMA 6F (CATHETERS) IMPLANT
KIT ENCORE 26 ADVANTAGE (KITS) IMPLANT
KIT MICROPUNCTURE NIT STIFF (SHEATH) IMPLANT
KIT PV (KITS) ×2 IMPLANT
SHEATH PINNACLE 6F 10CM (SHEATH) IMPLANT
SHEATH PROBE COVER 6X72 (BAG) IMPLANT
STENT HERCULINK RX 5.0X12X135 (Permanent Stent) IMPLANT
STENT HERCULINK RX 5.5X12X135 (Permanent Stent) IMPLANT
STOPCOCK MORSE 400PSI 3WAY (MISCELLANEOUS) IMPLANT
TRANSDUCER W/STOPCOCK (MISCELLANEOUS) ×2 IMPLANT
TRAY PV CATH (CUSTOM PROCEDURE TRAY) ×2 IMPLANT
TUBING CIL FLEX 10 FLL-RA (TUBING) IMPLANT
WIRE BENTSON .035X145CM (WIRE) IMPLANT
WIRE STABILIZER XS .014X180CM (WIRE) IMPLANT

## 2022-01-15 NOTE — Op Note (Signed)
    Patient name: Kayla Mcclure MRN: [id number] DOB: 05-03-1945 Sex: female  01/15/2022 Pre-operative Diagnosis: High-grade bilateral renal artery stenosis with chronic kidney disease Post-operative diagnosis:  Same Surgeon:  Eda Paschal. Donzetta Matters, MD Procedure Performed: 1.  Ultrasound-guided cannulation right common femoral artery 2.  Aortogram 3.  Right renal artery stenting with 5 x 12 mm Herculink 4.  Left renal artery stenting with 5.5 x 12 mm Herculink 5.  Moderate sedation with fentanyl and Versed for 36 minutes  Indications: 76 year old female with history of advancing chronic kidney disease was initially considered for dialysis access placement but after further consideration with the nephrologist we elected for renal artery stenting to delay decrease in renal function.  She now presents for aortogram with possible renal artery stenting.  Findings: Aorta did not have any severe flow limitation.  There is a left common iliac artery stent which is patent.  Right renal artery had 70% stenosis reduced to 0% and left renal artery had 95% stenosis reduced to 0% after stenting.   Procedure:  The patient was identified in the holding area and taken to room 8.  The patient was then placed supine on the table and prepped and draped in the usual sterile fashion.  A time out was called.  Ultrasound was used to evaluate the right common artery.  There was significant calcification there and I elected for no closure device at completion.  The area was anesthetized 1% lidocaine cannulated micropuncture needle followed by wire and sheath.  Bentson wires placed followed by a 6 [place] sheath.  Concomitantly we administered fentanyl and Versed for total of 36 minutes and her vital signs were monitored throughout the case.  We placed an IM guide catheter we able to for selective right renal artery a wire was placed past this we confirmed intraluminal access and primarily stented with a 5 x 12 mm balloon  expandable stent.  Attention was then turned to the left side we were able to cross this with a similar IM guide catheter and confirmed intraluminal access of the left was stented with 5.5 x 12 mm stent.  Completion demonstrated no residual stenosis.  Catheter wire were removed.'s sheath will be pulled postoperative holding.  She tolerated procedure without any complication.  Contrast: 70cc  Jaykob Minichiello C. Donzetta Matters, MD [facility] Office: [phone] Pager: [phone]

## 2022-01-15 NOTE — Progress Notes (Signed)
Site area: Right groin a 6 french arterial sheath was removed by Modesta Messing [facility]  Site Prior to Removal:  Level 0  Pressure Applied For 20 MINUTES    Bedrest Beginning at 1330pm X 4 hours  Manual:   Yes.    Patient Status During Pull:  stable  Post Pull Groin Site:  Level 0  Post Pull Instructions Given:  Yes.    Post Pull Pulses Present:  Yes.    Dressing Applied:  Yes.    Comments:

## 2022-01-15 NOTE — Progress Notes (Signed)
Patient and son was given discharge instructions. Both verbalized understanding. 

## 2022-01-15 NOTE — Interval H&P Note (Signed)
History and Physical Interval Note:  01/15/2022 7:43 AM  [place] Sherran Needs  has presented today for surgery, with the diagnosis of instage renal.  The various methods of treatment have been discussed with the patient and family. After consideration of risks, benefits and other options for treatment, the patient has consented to  Procedure(s): RENAL ANGIOGRAPHY (N/A) as a surgical intervention.  The patient's history has been reviewed, patient examined, no change in status, stable for surgery.  I have reviewed the patient's chart and labs.  Questions were answered to the patient's satisfaction.     Servando Snare

## 2022-01-16 ENCOUNTER — Telehealth (HOSPITAL_COMMUNITY): Payer: Self-pay | Admitting: Cardiology

## 2022-01-16 ENCOUNTER — Encounter (HOSPITAL_COMMUNITY): Payer: Self-pay | Admitting: Vascular Surgery

## 2022-01-16 NOTE — Telephone Encounter (Signed)
Patient cancelled Myoview for reason below: 01/16/2022 9:17 AM SK:SHNG, Kayla Mcclure  Cancel Rsn: Patient (Patient just got out of the hospital and doesn't feel up to coming for the app. Patient will call to reschedule when ready.) Order will be removed from the [place].

## 2022-01-17 ENCOUNTER — Encounter (HOSPITAL_COMMUNITY): Payer: Medicare Other

## 2022-01-17 ENCOUNTER — Telehealth: Payer: Self-pay | Admitting: Cardiology

## 2022-01-17 NOTE — Telephone Encounter (Signed)
Patient states she was supposed to have a stress test done today, but she was just discharged from the hospital recently. She would like to know when she should have the stress test and the heart monitor done.

## 2022-01-17 NOTE — Telephone Encounter (Signed)
Returned call to patient, she states she had to cancel stress test due to having procedure 10/16 (renal angiography).   She is requesting to get this rescheduled.     Advised will have scheduling team call to arrange.  Patient aware.

## 2022-01-19 ENCOUNTER — Ambulatory Visit: Admit: 2022-01-19 | Payer: Medicare Other | Admitting: Vascular Surgery

## 2022-01-19 SURGERY — ARTERIOVENOUS (AV) FISTULA CREATION
Anesthesia: Choice | Laterality: Left

## 2022-01-19 MED FILL — Heparin Sod (Porcine)-NaCl IV Soln 1000 Unit/500ML-0.9%: INTRAVENOUS | Qty: 1000 | Status: AC

## 2022-01-23 DIAGNOSIS — R55 Syncope and collapse: Secondary | ICD-10-CM

## 2022-01-23 DIAGNOSIS — I252 Old myocardial infarction: Secondary | ICD-10-CM | POA: Diagnosis not present

## 2022-01-23 DIAGNOSIS — I25119 Atherosclerotic heart disease of native coronary artery with unspecified angina pectoris: Secondary | ICD-10-CM | POA: Diagnosis not present

## 2022-01-24 DIAGNOSIS — I25119 Atherosclerotic heart disease of native coronary artery with unspecified angina pectoris: Secondary | ICD-10-CM | POA: Diagnosis not present

## 2022-01-24 DIAGNOSIS — R55 Syncope and collapse: Secondary | ICD-10-CM | POA: Diagnosis not present

## 2022-01-29 ENCOUNTER — Ambulatory Visit
Admission: RE | Admit: 2022-01-29 | Discharge: 2022-01-29 | Disposition: A | Discharge status: Home / Self Care | Payer: Medicare Other | Source: Ambulatory Visit | Attending: Acute Care | Admitting: Acute Care

## 2022-01-29 DIAGNOSIS — R0602 Shortness of breath: Secondary | ICD-10-CM | POA: Diagnosis not present

## 2022-01-29 DIAGNOSIS — R911 Solitary pulmonary nodule: Secondary | ICD-10-CM | POA: Diagnosis not present

## 2022-01-29 DIAGNOSIS — I700 Atherosclerosis of aorta: Secondary | ICD-10-CM | POA: Diagnosis not present

## 2022-01-29 DIAGNOSIS — R918 Other nonspecific abnormal finding of lung field: Secondary | ICD-10-CM | POA: Diagnosis not present

## 2022-01-29 DIAGNOSIS — J439 Emphysema, unspecified: Secondary | ICD-10-CM | POA: Diagnosis not present

## 2022-01-29 DIAGNOSIS — R590 Localized enlarged lymph nodes: Secondary | ICD-10-CM | POA: Diagnosis not present

## 2022-01-30 ENCOUNTER — Telehealth (HOSPITAL_COMMUNITY): Payer: Self-pay | Admitting: *Deleted

## 2022-01-30 NOTE — Telephone Encounter (Signed)
Pt given detailed instructions for MPI on 02/01/22.

## 2022-02-01 ENCOUNTER — Ambulatory Visit (HOSPITAL_COMMUNITY): Payer: Medicare Other | Attending: Cardiology

## 2022-02-01 DIAGNOSIS — R0609 Other forms of dyspnea: Secondary | ICD-10-CM | POA: Diagnosis not present

## 2022-02-01 DIAGNOSIS — I25119 Atherosclerotic heart disease of native coronary artery with unspecified angina pectoris: Secondary | ICD-10-CM | POA: Diagnosis not present

## 2022-02-01 DIAGNOSIS — Z9861 Coronary angioplasty status: Secondary | ICD-10-CM | POA: Insufficient documentation

## 2022-02-01 DIAGNOSIS — R55 Syncope and collapse: Secondary | ICD-10-CM | POA: Diagnosis not present

## 2022-02-01 DIAGNOSIS — I2510 Atherosclerotic heart disease of native coronary artery without angina pectoris: Secondary | ICD-10-CM

## 2022-02-01 LAB — MYOCARDIAL PERFUSION IMAGING
LV dias vol: 42 mL (ref 46–106)
LV sys vol: 10 mL
Nuc Stress EF: 77 %
Peak HR: 83 {beats}/min
Rest HR: 62 {beats}/min
Rest Nuclear Isotope Dose: 10.9 mCi
SDS: 2
SRS: 0
SSS: 2
ST Depression (mm): 0 mm
Stress Nuclear Isotope Dose: 30.2 mCi
TID: 0.92

## 2022-02-01 MED ORDER — TECHNETIUM TC 99M TETROFOSMIN IV KIT
10.9000 | PACK | Freq: Once | INTRAVENOUS | Status: AC | PRN
  Administered 2022-02-01: 10.9 via INTRAVENOUS

## 2022-02-01 MED ORDER — REGADENOSON 0.4 MG/5ML IV SOLN
0.4000 mg | Freq: Once | INTRAVENOUS | Status: AC
  Administered 2022-02-01: 0.4 mg via INTRAVENOUS

## 2022-02-01 MED ORDER — TECHNETIUM TC 99M TETROFOSMIN IV KIT
30.2000 | PACK | Freq: Once | INTRAVENOUS | Status: AC | PRN
  Administered 2022-02-01: 30.2 via INTRAVENOUS

## 2022-02-02 ENCOUNTER — Encounter: Payer: Self-pay | Admitting: Acute Care

## 2022-02-02 ENCOUNTER — Ambulatory Visit (INDEPENDENT_AMBULATORY_CARE_PROVIDER_SITE_OTHER): Payer: Medicare Other | Admitting: Acute Care

## 2022-02-02 VITALS — BP 140/74 | HR 79 | Temp 98.6°F | Ht 59.0 in | Wt 124.0 lb

## 2022-02-02 DIAGNOSIS — F17210 Nicotine dependence, cigarettes, uncomplicated: Secondary | ICD-10-CM

## 2022-02-02 DIAGNOSIS — J479 Bronchiectasis, uncomplicated: Secondary | ICD-10-CM | POA: Diagnosis not present

## 2022-02-02 DIAGNOSIS — R911 Solitary pulmonary nodule: Secondary | ICD-10-CM

## 2022-02-02 DIAGNOSIS — Z720 Tobacco use: Secondary | ICD-10-CM

## 2022-02-02 MED ORDER — DOXYCYCLINE HYCLATE 100 MG PO TABS: 100.0000 mg | ORAL_TABLET | Freq: Two times a day (BID) | ORAL | 0 refills | Status: DC

## 2022-02-02 NOTE — Patient Instructions (Addendum)
It is good to see you today. We will start treating your bronchiectasis Start Mucinex 600 mg in the morning and 600 mg in the evening with a full glass of water. Use Flutter valve 10 times a day. We will send in a prescription for Doxycycline 100 mg one in the morning and one in the evening x 1 week.  Take probiotic with antibiotic. ( Activia Yogurt cups once daily) We will do a 3 month follow up CT Chest without contrast to re-evaluate the new nodule . If Dr. Valeta Harms wants to do anything different, we will call you and let you know.  Work on quitting smoking entirely!! Call 1-800-QUIT NOW for free nicotine patches, gum or mints.  Follow up with Judson Roch NP  after the CT Chest without contrast in Feb 2024. Call if you need to be seen sooner.  Make sure you get you Flu vaccine and Pneumonia vaccine at the drug store. I will have my nurse print out your vaccination history Please contact office for sooner follow up if symptoms do not improve or worsen or seek emergency care

## 2022-02-02 NOTE — Progress Notes (Signed)
History of Present Illness Kayla Mcclure is a 76 y.o. female  former smoker  with past medical history of coronary disease, NSTEMI, CKD, chronic diastolic heart failure, history of MI, history of stroke. She is followed by the lung cancer screening program. She had an abnormal low dose CT Chest, and PET scan . She was seen by Dr. Valeta Harms for bronchoscopy with tissue sampling on 06/06/2021. Biopsies were negative. Plan was for short term follow up imaging.      02/02/2022 Pt. Presents for  follow up after short term follow up after her  CT Chest. Her follow up CT Chest  shows stable nodules with the exception of some progressive nodularity in the right upper lobe including a new 0.6 x 0.7 cm right upper lobe nodule.  There is comment by radiology that these nodules could be due to atypical infection or entity such as sarcoidosis or neoplasm. There was also notation of cyclic bronchiectasis in both lungs, favoring upper lobes. Mild cardiomegaly, and a prominent main pulmonary artery suggesting pulmonary artery hypertension.  Patient is followed by cardiology.  Last echo was done October 2023 which does show increased right heart pressures however per Dr. Allison Quarry note, both of these findings are stable when compared to the 2021 echo. Patient is currently not actively treating her bronchiectasis.  She states she coughs up thick white to green secretions every morning that are very difficult to get up.  She denies any fever.  She states she smokes about 1 pack of cigarettes a week.  I have asked her to work very hard on quitting.  She states she uses albuterol 1-2 times a week just as needed for shortness of breath or wheezing   Test Results: CT Chest without contrast 01/29/2022 The dominant right perifissural nodule is stable in size from 09/15/2021, currently 1.1 by 0.7 cm and previously had a maximum SUV of 4.6. The left upper lobe nodule is stable in size compared to 09/15/2021, and  previously had a maximum SUV of 4.5. There is some progressive nodularity in the right upper lobe including a new 0.6 by 0.7 cm right upper lobe nodule. These nodules could be due to atypical infection, entity such as sarcoidosis, or neoplastic. Other scattered pulmonary nodules are stable. Stable mild mediastinal adenopathy. Substantial airway thickening with scattered regions of cystic or varicoid bronchiectasis in both lungs, favoring the upper lobes. Prominent main pulmonary artery suggesting pulmonary arterial hypertension. 9. Mild cardiomegaly. Coronary, aortic arch, and branch vessel atherosclerotic vascular disease. 10. Thoracic spondylosis. 11. Benign Bosniak category 2 left renal cyst. This warrants no further workup.  09/18/2021 CT chest without contrast No significant change in previously demonstrated bilateral pulmonary nodularity. Given associated chronic lung disease, relative stability and previous biopsy results, findings may reflect atypical infection such as atypical mycobacterial infection. Continued CT follow-up recommended. Previously identified mediastinal adenopathy appears slightly improved. No new findings identified. Coronary and Aortic Atherosclerosis (ICD10-I70.0).  04/12/2021 Low-dose screening CT Lung-RADS 4B, suspicious. Additional imaging evaluation or consultation with Pulmonology or Thoracic Surgery recommended. New nodules in both upper lobes and the right lower lobe. Infectious and malignant etiologies are considered. These results will be called to the ordering clinician or representative by the Radiologist Assistant, and communication documented in the PACS or [employer]. 2. Aortic atherosclerosis (ICD10-I70.0). Coronary artery calcification. 3.  Emphysema (ICD10-J43.9). 4. Enlarged pulmonic trunk, indicative of pulmonary arterial hypertension.    Cytology 06/06/2021 LUL and RLL   FINAL MICROSCOPIC DIAGNOSIS:  A. LUNG, LUL,  FINE  NEEDLE ASPIRATION:  - No malignant cells identified   B. LUNG, LUL, BRUSH:  - No malignant cells identified    FINAL MICROSCOPIC DIAGNOSIS:  D. LUNG, RLL, FINE NEEDLE ASPIRATION:  - No malignant cells identified.   COMMENT:   Occasional giant cells are present, and there is a suggestion of a rare  granuloma.          Latest Ref Rng & Units 01/15/2022    8:30 AM 06/06/2021    1:02 PM 09/09/2020   12:15 AM  CBC  WBC 4.0 - 10.5 K/uL  6.4  6.0   Hemoglobin 12.0 - 15.0 g/dL 13.3  12.8  11.0   Hematocrit 36.0 - 46.0 % 39.0  40.6  33.9   Platelets 150 - 400 K/uL  165  134        Latest Ref Rng & Units 01/15/2022    8:30 AM 06/06/2021    1:02 PM 09/09/2020   12:15 AM  BMP  Glucose 70 - 99 mg/dL 102  86  85   BUN 8 - 23 mg/dL 38  17  20   Creatinine 0.44 - 1.00 mg/dL 1.40  1.14  1.14   Sodium 135 - 145 mmol/L 140  140  137   Potassium 3.5 - 5.1 mmol/L 5.0  5.0  3.8   Chloride 98 - 111 mmol/L 104  107  105   CO2 22 - 32 mmol/L  24  25   Calcium 8.9 - 10.3 mg/dL  9.2  8.6     BNP    Component Value Date/Time   BNP 136.4 (H) 09/07/2020 1215    ProBNP No results found for: "PROBNP"  PFT No results found for: "FEV1PRE", "FEV1POST", "FVCPRE", "FVCPOST", "TLC", "DLCOUNC", "PREFEV1FVCRT", "PSTFEV1FVCRT"  MYOCARDIAL PERFUSION IMAGING  Result Date: 02/01/2022   Findings are consistent with no prior ischemia. The study is low risk.   A pharmacological stress test was performed using IV Lexiscan 0.'4mg'$  over 10 seconds performed without concurrent submaximal exercise. Hypotensive blood pressure response noted during stress.   No ST deviation was noted.   Left ventricular function is normal. Nuclear stress EF: 77 %. The left ventricular ejection fraction is hyperdynamic (>65%). Small LV cavity sizes at both end systole and end diastole.   Prior study not available for comparison. Prominent RV uptake noted on study. Blood pressure dropped significantly with infusion of lexiscan (193/75  prior to infusion -> 134/62 with infusion -> 154/67 at end of recovery). Small chamber size with hyperdynamic LVEF, no evidence of ischemia.   CT Chest Wo Contrast  Result Date: 01/31/2022 CLINICAL DATA:  Follow up of multiple lung nodules. Shortness of breath EXAM: CT CHEST WITHOUT CONTRAST TECHNIQUE: Multidetector CT imaging of the chest was performed following the standard protocol without IV contrast. RADIATION DOSE REDUCTION: This exam was performed according to the departmental dose-optimization program which includes automated exposure control, adjustment of the mA and/or kV according to patient size and/or use of iterative reconstruction technique. COMPARISON:  09/15/2021 FINDINGS: Cardiovascular: Coronary, aortic arch, and branch vessel atherosclerotic vascular disease. Mild cardiomegaly. Prominent main pulmonary artery suggesting pulmonary arterial hypertension. Mediastinum/Nodes: Partially calcified right lower paratracheal lymph node 1.0 cm in short axis on image 51 series 2, previously the same when measured in the same manner. AP window lymph node 0.9 cm in short axis on image 48 series 2, formerly 1.0 cm. Subcarinal node 1.2 cm in short axis on image 63 series 2, formerly 1.1 cm by my  measurements. Lungs/Pleura: Substantial airway thickening noted. Scattered scarring and localized interstitial accentuation observed typically correlating with regions of cystic or varicoid bronchiectasis scattered in both lungs but favoring the upper lobes in a pattern not substantially changed from the 09/15/2021 exam. Small presumed fiducial in a left upper lobe airway on image 33 series 3. This is just posterior to a focus prick of peribronchovascular nodularity or vascular dilatation with the lesion difficult to separate from the subtending vessels, but potentially measuring up to 1.3 by 0.9 cm on image 30 of series 3 (formerly 1.2 by 1.0 cm by my measurements on 09/15/2021, essentially stable). This nodule was  not appreciable on the CT from 03/02/2020 but was visible on the CT from 04/12/2021, with the PET-CT of 05/16/2021 demonstrating maximum SUV of 4.5. A 0.6 cm right upper lobe nodule medially on image 43 of series 3 previously had a maximum SUV of 2.6 and is stable from 05/16/2021. A perifissural lesion on the right posterior to the right mainstem bronchus measures 1.1 by 0.7 cm on image 56 of series 3 and previously had a maximum SUV of 4.6. A superior segment right lower lobe fiducial is present. There is some progressive right upper lobe nodularity including a new 0.6 by 0.7 cm right upper lobe nodule on image 25 series 3. Emphysema is present. Upper Abdomen: Prominent abdominal aortic atherosclerotic vascular calcification. Substantial calcification proximally in the renal arteries. Fluid density 1.1 cm left kidney upper pole cyst, image 124 series 2, this is benign and warrants no further workup. Hyperdense 0.8 by 0.6 cm left mid kidney lesion posteriorly on image 144 of series 2 has an internal density of 76 Hounsfield units compatible with a benign Bosniak category 2 cyst. This warrants no further workup. Musculoskeletal: Thoracic spondylosis. IMPRESSION: 1. The dominant right perifissural nodule is stable in size from 09/15/2021, currently 1.1 by 0.7 cm and previously had a maximum SUV of 4.6. 2. The left upper lobe nodule is stable in size compared to 09/15/2021, and previously had a maximum SUV of 4.5. 3. There is some progressive nodularity in the right upper lobe including a new 0.6 by 0.7 cm right upper lobe nodule. 4. These nodules could be due to atypical infection, entity such as sarcoidosis, or neoplastic. 5. Other scattered pulmonary nodules are stable. 6. Stable mild mediastinal adenopathy. 7. Substantial airway thickening with scattered regions of cystic or varicoid bronchiectasis in both lungs, favoring the upper lobes. 8. Prominent main pulmonary artery suggesting pulmonary arterial  hypertension. 9. Mild cardiomegaly. Coronary, aortic arch, and branch vessel atherosclerotic vascular disease. 10. Thoracic spondylosis. 11. Benign Bosniak category 2 left renal cyst. This warrants no further workup. Aortic Atherosclerosis (ICD10-I70.0). Electronically Signed   By: Van Clines M.D.   On: 01/31/2022 12:07   PERIPHERAL VASCULAR CATHETERIZATION  Result Date: 01/16/2022 Images from the original result were not included. Patient name: Adysson Revelle MRN: [id number] DOB: 17-Apr-1945 Sex: female 01/15/2022 Pre-operative Diagnosis: High-grade bilateral renal artery stenosis with chronic kidney disease Post-operative diagnosis:  Same Surgeon:  Eda Paschal. Donzetta Matters, MD Procedure Performed: 1.  Ultrasound-guided cannulation right common femoral artery 2.  Aortogram 3.  Right renal artery stenting with 5 x 12 mm Herculink 4.  Left renal artery stenting with 5.5 x 12 mm Herculink 5.  Moderate sedation with fentanyl and Versed for 36 minutes Indications: 76 year old female with history of advancing chronic kidney disease was initially considered for dialysis access placement but after further consideration with the nephrologist we elected for renal  artery stenting to delay decrease in renal function.  She now presents for aortogram with possible renal artery stenting. Findings: Aorta did not have any severe flow limitation.  There is a left common iliac artery stent which is patent.  Right renal artery had 70% stenosis reduced to 0% and left renal artery had 95% stenosis reduced to 0% after stenting.  Procedure:  The patient was identified in the holding area and taken to room 8.  The patient was then placed supine on the table and prepped and draped in the usual sterile fashion.  A time out was called.  Ultrasound was used to evaluate the right common artery.  There was significant calcification there and I elected for no closure device at completion.  The area was anesthetized 1% lidocaine cannulated  micropuncture needle followed by wire and sheath.  Bentson wires placed followed by a 6 [place] sheath.  Concomitantly we administered fentanyl and Versed for total of 36 minutes and her vital signs were monitored throughout the case.  We placed an IM guide catheter we able to for selective right renal artery a wire was placed past this we confirmed intraluminal access and primarily stented with a 5 x 12 mm balloon expandable stent.  Attention was then turned to the left side we were able to cross this with a similar IM guide catheter and confirmed intraluminal access of the left was stented with 5.5 x 12 mm stent.  Completion demonstrated no residual stenosis.  Catheter wire were removed.'s sheath will be pulled postoperative holding.  She tolerated procedure without any complication. Contrast: 70cc Brandon C. Donzetta Matters, MD [facility] Office: [phone] Pager: [phone]   ECHOCARDIOGRAM COMPLETE  Result Date: 01/08/2022    ECHOCARDIOGRAM REPORT   Patient Name:   JULIYA MAGILL Date of Exam: 01/08/2022 Medical Rec #:  [id number]             Height:       59.0 in Accession #:    [id number]            Weight:       124.2 lb Date of Birth:  29-Dec-1945              BSA:          1.506 m Patient Age:    69 years              BP:           132/58 mmHg Patient Gender: F                     HR:           59 bpm. Exam Location:  [facility] Procedure: 2D Echo, Cardiac Doppler and Color Doppler Indications:    R06.00 Dyspnea; R55 Syncope; I25.10 Coronary artery disease;  History:        Patient has prior history of Echocardiogram examinations, most                 recent 05/14/2019. CHF, Previous Myocardial Infarction, PAD and                 Stroke; Risk Factors:Hypertension and Dyslipidemia. Dyspnea on                 exertion. Bilateral carotid artery disease. Asthma. Chronic                 kidney diease.  Sonographer:  Diamond Nickel RCS Referring Phys: [place]  1. Left ventricular ejection fraction, by estimation, is 60 to 65%. The left ventricle has normal function. The left ventricle has no regional wall motion abnormalities. Left ventricular diastolic parameters are consistent with Grade II diastolic dysfunction (pseudonormalization).  2. Right ventricular systolic function is mildly reduced. The right ventricular size is mildly enlarged.  3. Right atrial size was moderately dilated.  4. The mitral valve is normal in structure. Trivial mitral valve regurgitation. No evidence of mitral stenosis.  5. Tricuspid valve regurgitation is moderate to severe.  6. The aortic valve is tricuspid. There is moderate thickening of the aortic valve. Aortic valve regurgitation is mild to moderate. No aortic stenosis is present.  7. The inferior vena cava is normal in size with greater than 50% respiratory variability, suggesting right atrial pressure of 3 mmHg. FINDINGS  Left Ventricle: Left ventricular ejection fraction, by estimation, is 60 to 65%. The left ventricle has normal function. The left ventricle has no regional wall motion abnormalities. The left ventricular internal cavity size was normal in size. There is  no left ventricular hypertrophy. Left ventricular diastolic parameters are consistent with Grade II diastolic dysfunction (pseudonormalization). Right Ventricle: The right ventricular size is mildly enlarged. No increase in right ventricular wall thickness. Right ventricular systolic function is mildly reduced. Left Atrium: Left atrial size was normal in size. Right Atrium: Right atrial size was moderately dilated. Pericardium: There is no evidence of pericardial effusion. Presence of epicardial fat layer. Mitral Valve: The mitral valve is normal in structure. Mild mitral annular calcification. Trivial mitral valve regurgitation. No evidence of mitral valve stenosis. Tricuspid Valve: The tricuspid valve is normal in structure. Tricuspid valve regurgitation  is moderate to severe. No evidence of tricuspid stenosis. Aortic Valve: The aortic valve is tricuspid. There is moderate thickening of the aortic valve. Aortic valve regurgitation is mild to moderate. Aortic regurgitation PHT measures 551 msec. No aortic stenosis is present. Pulmonic Valve: The pulmonic valve was not well visualized. Pulmonic valve regurgitation is trivial. No evidence of pulmonic stenosis. Aorta: The aortic root is normal in size and structure. Venous: The inferior vena cava is normal in size with greater than 50% respiratory variability, suggesting right atrial pressure of 3 mmHg. IAS/Shunts: No atrial level shunt detected by color flow Doppler.  LEFT VENTRICLE PLAX 2D LVIDd:         3.50 cm   Diastology LVIDs:         2.10 cm   LV e' medial:    6.65 cm/s LV PW:         0.90 cm   LV E/e' medial:  15.6 LV IVS:        0.80 cm   LV e' lateral:   9.73 cm/s LVOT diam:     1.50 cm   LV E/e' lateral: 10.7 LV SV:         38 LV SV Index:   25 LVOT Area:     1.77 cm  RIGHT VENTRICLE RV Basal diam:  3.50 cm RV S prime:     9.08 cm/s TAPSE (M-mode): 1.6 cm RVSP:           78.0 mmHg LEFT ATRIUM             Index        RIGHT ATRIUM           Index LA diam:        2.90 cm  1.93 cm/m   RA Pressure: 3.00 mmHg LA Vol (A2C):   33.4 ml 22.17 ml/m  RA Area:     17.40 cm LA Vol (A4C):   23.4 ml 15.53 ml/m  RA Volume:   47.70 ml  31.67 ml/m LA Biplane Vol: 28.2 ml 18.72 ml/m  AORTIC VALVE LVOT Vmax:   99.80 cm/s LVOT Vmean:  65.400 cm/s LVOT VTI:    0.215 m AI PHT:      551 msec  AORTA Ao Root diam: 2.70 cm Ao Asc diam:  3.10 cm MITRAL VALVE                TRICUSPID VALVE MV Area (PHT): 4.21 cm     TR Peak grad:   75.0 mmHg MV Decel Time: 180 msec     TR Vmax:        433.00 cm/s MV E velocity: 104.00 cm/s  Estimated RAP:  3.00 mmHg MV A velocity: 69.20 cm/s   RVSP:           78.0 mmHg MV E/A ratio:  1.50                             SHUNTS                             Systemic VTI:  0.22 m                              Systemic Diam: 1.50 cm Kardie Tobb DO Electronically signed by Berniece Salines DO Signature Date/Time: 01/08/2022/6:32:58 PM    Final    VAS [place] UPPER EXTREMITY ARTERIAL DUPLEX  Result Date: 01/03/2022  UPPER EXTREMITY DUPLEX STUDY Patient Name:  SEYMONE FORLENZA  Date of Exam:   01/03/2022 Medical Rec #: [id number]              Accession #:    [id number] Date of Birth: [birth year]               Patient Gender: F Patient Age:   54 years Exam Location:  [facility] Procedure:      VAS [place] UPPER EXTREMITY ARTERIAL DUPLEX Referring Phys: Servando Snare --------------------------------------------------------------------------------  Indications: Pre-access.  Performing Technologist: Alvia Grove RVT  Examination Guidelines: A complete evaluation includes B-mode imaging, spectral Doppler, color Doppler, and power Doppler as needed of all accessible portions of each vessel. Bilateral testing is considered an integral part of a complete examination. Limited examinations for reoccurring indications may be performed as noted.  Right Pre-Dialysis Findings: +-----------------------+----------+--------------------+---------+-----------+ Location               PSV (cm/s)Intralum. Diam. (cm)Waveform Comments    +-----------------------+----------+--------------------+---------+-----------+ Brachial Antecub. fossa68        0.41                triphasic            +-----------------------+----------+--------------------+---------+-----------+ Radial Art at Wrist    110       0.24                triphasicthick walls +-----------------------+----------+--------------------+---------+-----------+ Ulnar Art at Wrist     64        0.19                triphasic            +-----------------------+----------+--------------------+---------+-----------+  Left Pre-Dialysis Findings: +-----------------------+----------+--------------------+---------+-----------+ Location               PSV  (cm/s)Intralum. Diam. (cm)Waveform Comments    +-----------------------+----------+--------------------+---------+-----------+ Brachial Antecub. fossa66        0.37                triphasic            +-----------------------+----------+--------------------+---------+-----------+ Radial Art at Wrist    69        0.19                triphasicthick walls +-----------------------+----------+--------------------+---------+-----------+ Ulnar Art at Wrist     65        0.15                triphasic            +-----------------------+----------+--------------------+---------+-----------+  Summary:   Measurements above. *See table(s) above for measurements and observations. Electronically signed by Servando Snare MD on 01/03/2022 at 4:31:50 PM.    Final    VAS [place] UPPER EXT VEIN MAPPING (PRE-OP AVF)  Result Date: 01/03/2022 UPPER EXTREMITY VEIN MAPPING Patient Name:  JARIA CONWAY  Date of Exam:   01/03/2022 Medical Rec #: [id number]              Accession #:    [id number] Date of Birth: Oct 30, 1945               Patient Gender: F Patient Age:   74 years Exam Location:  [facility] Procedure:      VAS [place] UPPER EXT VEIN MAPPING (PRE-OP AVF) Referring Phys: Servando Snare --------------------------------------------------------------------------------  Indications: Pre-access. Performing Technologist: Alvia Grove RVT  Examination Guidelines: A complete evaluation includes B-mode imaging, spectral Doppler, color Doppler, and power Doppler as needed of all accessible portions of each vessel. Bilateral testing is considered an integral part of a complete examination. Limited examinations for reoccurring indications may be performed as noted. +-----------------+-------------+----------+--------------+ Right Cephalic   Diameter (cm)Depth (cm)   Findings    +-----------------+-------------+----------+--------------+ Prox upper arm                          not visualized  +-----------------+-------------+----------+--------------+ Mid upper arm                           not visualized +-----------------+-------------+----------+--------------+ Dist upper arm                          not visualized +-----------------+-------------+----------+--------------+ Antecubital fossa 0.16 / 0.17               joins      +-----------------+-------------+----------+--------------+ Prox forearm         0.13                              +-----------------+-------------+----------+--------------+ Mid forearm          0.16                              +-----------------+-------------+----------+--------------+ Dist forearm         0.13                              +-----------------+-------------+----------+--------------+ +-----------------+-------------+----------+---------------------------------+ Right Basilic  Diameter (cm)Depth (cm)            Findings              +-----------------+-------------+----------+---------------------------------+ Prox upper arm       0.26               not visualized and prox to mid ua +-----------------+-------------+----------+---------------------------------+ Mid upper arm        0.26                                                 +-----------------+-------------+----------+---------------------------------+ Dist upper arm       0.16                                                 +-----------------+-------------+----------+---------------------------------+ Antecubital fossa    0.21                                                 +-----------------+-------------+----------+---------------------------------+ Prox forearm         0.13                                                 +-----------------+-------------+----------+---------------------------------+ +-----------------+-------------+----------+--------+ Left Cephalic    Diameter (cm)Depth (cm)Findings  +-----------------+-------------+----------+--------+ Shoulder             0.16                        +-----------------+-------------+----------+--------+ Prox upper arm       0.17                        +-----------------+-------------+----------+--------+ Mid upper arm        0.14                        +-----------------+-------------+----------+--------+ Dist upper arm       0.11                        +-----------------+-------------+----------+--------+ Antecubital fossa    0.25                        +-----------------+-------------+----------+--------+ Prox forearm         0.19                        +-----------------+-------------+----------+--------+ Mid forearm          0.17                        +-----------------+-------------+----------+--------+ Dist forearm         0.11                        +-----------------+-------------+----------+--------+ +-----------------+-------------+----------+--------+ Left Basilic     Diameter (cm)Depth (cm)Findings +-----------------+-------------+----------+--------+ Prox upper arm  0.31                        +-----------------+-------------+----------+--------+ Mid upper arm        0.27                        +-----------------+-------------+----------+--------+ Dist upper arm       0.19                        +-----------------+-------------+----------+--------+ Antecubital fossa    0.21                        +-----------------+-------------+----------+--------+ Prox forearm         0.16                        +-----------------+-------------+----------+--------+ Summary:   Measurements above. *See table(s) above for measurements and observations.  Diagnosing physician: Servando Snare MD Electronically signed by Servando Snare MD on 01/03/2022 at 4:31:42 PM.    Final      Past medical hx Past Medical History:  Diagnosis Date   Acute GI bleeding 08/07/2019   Asthma 10/31/2007   CAD  (coronary artery disease) 08/05/2007   a) NSTEMI 2009: PCI to the RCA Promus DES 2.5 mm x 23 mm (3.0 mm). b) Myoview June 2017: Nonspecific ST changes. EF greater than 65%. LOW RISK, normal study. No ischemia or infarction.   Carotid artery disease [facility])    s/p Bilateral CEA March & May 2020.    Chronic back pain    Chronic diastolic heart failure (HCC)    CKD (chronic kidney disease), stage II    CKD (chronic kidney disease), stage III ([place])    Diverticulosis 04/11/2010   Colonoscopy   Dyslipidemia, goal LDL below 70    Essential hypertension    a. Normal renal arteries by PV angio 05/2013.   GERD (gastroesophageal reflux disease)    GI bleed 12/2015   History of kidney stones 1975   surgery to remove   History of recent blood transfusion 12/2015   12-19-15, 12-20-15 and 12-21-15   Insomnia    Internal hemorrhoids 04/11/2010   By colonoscopy   Iron deficiency anemia    Lung nodules 05/2021   bilateral   Myocardial infarction [facility]) 2009   Peripheral arterial disease ([place])    a. s/p PTA and stenting of L CIA stenosis 05/2013.   Renal insufficiency    Stroke [facility])    mini stroke   Tobacco abuse    Quit in 05/2013     Social History   Tobacco Use   Smoking status: Some Days    Types: Cigarettes    Last attempt to quit: 05/04/2013    Years since quitting: 8.7   Smokeless tobacco: Never   Tobacco comments:    She initially quit, but has gone back to smoking a couple cigarettes every now and then.  Vaping Use   Vaping Use: Never used  Substance Use Topics   Alcohol use: Yes    Comment: occasionally 3 times a year   Drug use: No  Patient is a current some day smoker She smokes 1 pack of cigarettes per week Patient has a greater than 20-pack-year smoking history  Tobacco Cessation: Counseled on smoking cessation 02/02/2022 Past surgical hx, Family hx, Social hx all reviewed.  Current Outpatient Medications on File Prior to  Visit  Medication Sig   albuterol (VENTOLIN HFA)  108 (90 Base) MCG/ACT inhaler Inhale 2 puffs into the lungs every 6 (six) hours as needed for wheezing or shortness of breath.   alendronate (FOSAMAX) 70 MG tablet Take 70 mg by mouth once a week.   amLODipine (NORVASC) 5 MG tablet Take 1 tablet (5 mg total) by mouth daily.   aspirin EC 81 MG tablet Take 1 tablet (81 mg total) by mouth daily.   atorvastatin (LIPITOR) 80 MG tablet Take 1 tablet (80 mg total) by mouth daily.   clopidogrel (PLAVIX) 75 MG tablet Take 1 tablet (75 mg total) by mouth daily.   ezetimibe (ZETIA) 10 MG tablet Take 1 tablet (10 mg total) by mouth daily.   ferrous gluconate (FERGON) 324 MG tablet Take 324 mg by mouth daily with breakfast.   furosemide (LASIX) 20 MG tablet Take 20 mg  One tablet on Monday , Wednesday , Fridays , may take an additional  20 mg ff needed   isosorbide mononitrate (IMDUR) 60 MG 24 hr tablet TAKE 1 TABLET BY MOUTH  DAILY   metoprolol succinate (TOPROL-XL) 100 MG 24 hr tablet Take 2 tablets (200 mg total) by mouth at bedtime.   nitroGLYCERIN (NITROSTAT) 0.4 MG SL tablet Place 1 tablet (0.4 mg total) under the tongue every 5 (five) minutes as needed for chest pain.   ondansetron (ZOFRAN) 4 MG tablet Take 1 tablet (4 mg total) by mouth every 6 (six) hours as needed for nausea.   pantoprazole (PROTONIX) 40 MG tablet Take 1 tablet (40 mg total) by mouth daily.   raloxifene (EVISTA) 60 MG tablet Take 60 mg by mouth daily.   valsartan (DIOVAN) 80 MG tablet Take 1 tablet (80 mg total) by mouth daily.   No current facility-administered medications on file prior to visit.     No Known Allergies  Review Of Systems:  Constitutional:   No  weight loss, night sweats,  Fevers, chills, fatigue, or  lassitude.  HEENT:   No headaches,  Difficulty swallowing,  Tooth/dental problems, or  Sore throat,                No sneezing, itching, ear ache, nasal congestion, post nasal drip,   CV:  No chest pain,  Orthopnea, PND, swelling in lower extremities,  anasarca, dizziness, palpitations, syncope.   GI  No heartburn, indigestion, abdominal pain, nausea, vomiting, diarrhea, change in bowel habits, loss of appetite, bloody stools.   Resp: No shortness of breath with exertion or at rest.  + excess mucus, + productive cough,  No non-productive cough,  No coughing up of blood.  + change in color of mucus.  Occasional  wheezing.  No chest wall deformity  Skin: no rash or lesions.  GU: no dysuria, change in color of urine, no urgency or frequency.  No flank pain, no hematuria   MS:  No joint pain or swelling.  No decreased range of motion.  No back pain.  Psych:  No change in mood or affect. No depression or anxiety.  No memory loss.   Vital Signs BP (!) 140/74 (BP Location: Left Arm, Cuff Size: Normal)   Pulse 79   Temp 98.6 F (37 C) (Oral)   Ht '4\' 11"'$  (1.499 m)   Wt 124 lb (56.2 kg)   SpO2 94%   BMI 25.04 kg/m    Physical Exam:  General- No distress,  A&Ox3, pleasant ENT: No sinus tenderness, TM clear, pale nasal mucosa,  no oral exudate,no post nasal drip, no LAN Cardiac: S1, S2, regular rate and rhythm, no murmur Chest: No wheeze/ rales/ dullness; no accessory muscle use, no nasal flaring, no sternal retractions, diminished per bases, prolonged expiratory phase of respiration cycle Abd.: Soft Non-tender, nondistended, bowel sounds positive,, Body mass index is 25.04 kg/m. Ext: No clubbing cyanosis, edema Neuro:  normal strength, moving all extremities x4, alert and oriented x3, appropriate Skin: No rashes, warm and dry, no lesions Psych: normal mood and behavior   Assessment/Plan Lung nodules per CT imaging Previously biopsied in 05/2021, negative both sides Current someday smoker/tobacco abuse Recent imaging with progressive nodularity in the right upper lobe Plan We will do a 3 month follow up CT Chest without contrast to re-evaluate the new nodule . If Dr. Valeta Harms wants to do anything different, we will call you and let  you know.  Work on quitting smoking entirely!! Call 1-800-QUIT NOW for free nicotine patches, gum or mints.  Follow up with Judson Roch NP  after the CT Chest without contrast in Feb 2024. Call if you need to be seen sooner.   Bronchiectasis on CT imaging Plan We will start treating your bronchiectasis Start Mucinex 600 mg in the morning and 600 mg in the evening with a full glass of water. Use Flutter valve 10 times a day. We will send in a prescription for Doxycycline 100 mg one in the morning and one in the evening x 1 week.  Take probiotic with antibiotic. ( Activia Yogurt cups once daily) 71-monthCT scan without contrast to reevaluate  Health maintenance Plan Make sure you get you Flu vaccine and Pneumonia vaccine at the drug store, as you are deferring on receiving them here today.. I will have my nurse print out your vaccination history to make sure your vaccination record at the drugstore where you are receiving your vaccine matches what we have done here  I spent 40 minutes dedicated to the care of this patient on the date of this encounter to include pre-visit review of records, face-to-face time with the patient discussing conditions above, post visit ordering of testing, clinical documentation with the electronic health record, making appropriate referrals as documented, and communicating necessary information to the patient's healthcare team.    SMagdalen Spatz NP 02/02/2022  10:54 AM

## 2022-02-06 DIAGNOSIS — I739 Peripheral vascular disease, unspecified: Secondary | ICD-10-CM | POA: Diagnosis not present

## 2022-02-06 DIAGNOSIS — I5032 Chronic diastolic (congestive) heart failure: Secondary | ICD-10-CM | POA: Diagnosis not present

## 2022-02-06 DIAGNOSIS — M810 Age-related osteoporosis without current pathological fracture: Secondary | ICD-10-CM | POA: Diagnosis not present

## 2022-02-06 DIAGNOSIS — I10 Essential (primary) hypertension: Secondary | ICD-10-CM | POA: Diagnosis not present

## 2022-02-06 DIAGNOSIS — D696 Thrombocytopenia, unspecified: Secondary | ICD-10-CM | POA: Diagnosis not present

## 2022-02-06 DIAGNOSIS — E559 Vitamin D deficiency, unspecified: Secondary | ICD-10-CM | POA: Diagnosis not present

## 2022-02-06 DIAGNOSIS — I779 Disorder of arteries and arterioles, unspecified: Secondary | ICD-10-CM | POA: Diagnosis not present

## 2022-02-06 DIAGNOSIS — I2510 Atherosclerotic heart disease of native coronary artery without angina pectoris: Secondary | ICD-10-CM | POA: Diagnosis not present

## 2022-02-06 DIAGNOSIS — Z23 Encounter for immunization: Secondary | ICD-10-CM | POA: Diagnosis not present

## 2022-02-06 DIAGNOSIS — Z0000 Encounter for general adult medical examination without abnormal findings: Secondary | ICD-10-CM | POA: Diagnosis not present

## 2022-02-06 DIAGNOSIS — E785 Hyperlipidemia, unspecified: Secondary | ICD-10-CM | POA: Diagnosis not present

## 2022-02-06 DIAGNOSIS — D509 Iron deficiency anemia, unspecified: Secondary | ICD-10-CM | POA: Diagnosis not present

## 2022-02-06 DIAGNOSIS — N1831 Chronic kidney disease, stage 3a: Secondary | ICD-10-CM | POA: Diagnosis not present

## 2022-02-06 DIAGNOSIS — I700 Atherosclerosis of aorta: Secondary | ICD-10-CM | POA: Diagnosis not present

## 2022-02-13 NOTE — Progress Notes (Unsigned)
Office Note     CC:  follow up Requesting Provider:  Lois Huxley, PA  HPI: Kayla Mcclure is a 76 y.o. (April 30, 1945) female who presents for follow up after Aortogram with Right renal artery stenting (5 x 12 mm Herculink) and Left renal artery stenting (5.5 x 12 mm Herculink) by Dr. Donzetta Matters on 01/15/22. This was performed secondary to advancing CKD. She was found to have 70% right RAD and 95% left RAS. At completion of intervention she had 0% residual stenosis. She tolerated procedure well and was discharged home.   Today she reports she is overall feeling good. She does take her BP at home regularly. She says that her BP varies between being very low at times, and then high. She denies any abdominal pain. She does feel that she is urinating frequently since the intervention. No concerns with femoral access site in right groin  Her Nephrologist was Dr. Justin Mend. She says she is unsure of the her new MD since Dr. Justin Mend left the practice. She has follow up next month for her first visit.   The pt is on a statin for cholesterol management.  The pt is on a daily aspirin. Other AC: Plavix The pt is on CCB, BB, ARB for hypertension  The pt is not diabetic. Tobacco hx:  some days  Past Medical History:  Diagnosis Date   Acute GI bleeding 08/07/2019   Asthma 10/31/2007   CAD (coronary artery disease) 08/05/2007   a) NSTEMI 2009: PCI to the RCA Promus DES 2.5 mm x 23 mm (3.0 mm). b) Myoview June 2017: Nonspecific ST changes. EF greater than 65%. LOW RISK, normal study. No ischemia or infarction.   Carotid artery disease [facility])    s/p Bilateral CEA March & May 2020.    Chronic back pain    Chronic diastolic heart failure (HCC)    CKD (chronic kidney disease), stage II    CKD (chronic kidney disease), stage III ([place])    Diverticulosis 04/11/2010   Colonoscopy   Dyslipidemia, goal LDL below 70    Essential hypertension    a. Normal renal arteries by PV angio 05/2013.   GERD (gastroesophageal  reflux disease)    GI bleed 12/2015   History of kidney stones 1975   surgery to remove   History of recent blood transfusion 12/2015   12-19-15, 12-20-15 and 12-21-15   Insomnia    Internal hemorrhoids 04/11/2010   By colonoscopy   Iron deficiency anemia    Lung nodules 05/2021   bilateral   Myocardial infarction [facility]) 2009   Peripheral arterial disease ([place])    a. s/p PTA and stenting of L CIA stenosis 05/2013.   Renal insufficiency    Stroke [facility])    mini stroke   Tobacco abuse    Quit in 05/2013    Past Surgical History:  Procedure Laterality Date   BRONCHIAL BIOPSY  06/06/2021   Procedure: BRONCHIAL BIOPSIES;  Surgeon: Garner Nash, DO;  Location: [facility]OSCOPY;  Service: Pulmonary;;   BRONCHIAL BRUSHINGS  06/06/2021   Procedure: BRONCHIAL BRUSHINGS;  Surgeon: Garner Nash, DO;  Location: [facility] ENDOSCOPY;  Service: Pulmonary;;   BRONCHIAL NEEDLE ASPIRATION BIOPSY  06/06/2021   Procedure: BRONCHIAL NEEDLE ASPIRATION BIOPSIES;  Surgeon: Garner Nash, DO;  Location: [place] ENDOSCOPY;  Service: Pulmonary;;   CAPSULE ENDOSCOPY  10/27/2019   [facility]) evidence of "pinhole leaking" in both stomach and proximal small bowel; no overt bleeding noted   CHOLECYSTECTOMY  1975  COLONOSCOPY  04/11/2010   COLONOSCOPY  11/30/2015   CORONARY ANGIOPLASTY WITH STENT PLACEMENT  08/05/2007   Promus DES 2.5 mm x 23 mm (3 mm)  to RCA; EF 60-70%   ENDARTERECTOMY Left 07/28/2019   Procedure: ENDARTERECTOMY CAROTID;  Surgeon: Waynetta Sandy, MD;  Location: [place];  Service: Vascular;  Laterality: Left;   ENDARTERECTOMY Right 08/27/2019   Procedure: ENDARTERECTOMY CAROTID RIGHT WITH PATCH ANGIOPLASTY;  Surgeon: Waynetta Sandy, MD;  Location: [place];  Service: Vascular;  Laterality: Right;   ENTEROSCOPY N/A 12/27/2015   Procedure: ENTEROSCOPY;  Surgeon: Clarene Essex, MD;  Location: [facility] ENDOSCOPY;  Service: Endoscopy;  Laterality: N/A;  also needs slim egd scope   ENTEROSCOPY N/A 05/15/2019    Procedure: ENTEROSCOPY;  Surgeon: Ronald Lobo, MD;  Location: [place];  Service: Endoscopy;  Laterality: N/A;  Push enteroscopy using either ultraslim colonoscope or push enteroscope   ENTEROSCOPY N/A 08/08/2019   Procedure: ENTEROSCOPY;  Surgeon: Ronnette Juniper, MD;  Location: [place];  Service: Gastroenterology;  Laterality: N/A;   ESOPHAGOGASTRODUODENOSCOPY N/A 09/13/2013   Procedure: ESOPHAGOGASTRODUODENOSCOPY (EGD);  Surgeon: Lear Ng, MD;  Location: [facility] ENDOSCOPY;  Service: Endoscopy;  Laterality: N/A;   ESOPHAGOGASTRODUODENOSCOPY N/A 07/14/2015   Procedure: ESOPHAGOGASTRODUODENOSCOPY (EGD);  Surgeon: Clarene Essex, MD;  Location: [facility] ENDOSCOPY;  Service: Endoscopy;  Laterality: N/A;   ESOPHAGOGASTRODUODENOSCOPY (EGD) WITH PROPOFOL N/A 09/09/2020   Procedure: ESOPHAGOGASTRODUODENOSCOPY (EGD) WITH PROPOFOL;  Surgeon: Clarene Essex, MD;  Location: [place];  Service: Endoscopy;  Laterality: N/A;   FIDUCIAL MARKER PLACEMENT  06/06/2021   Procedure: FIDUCIAL MARKER PLACEMENT;  Surgeon: Garner Nash, DO;  Location: [place];  Service: Pulmonary;;   gi bleed  07/2015   blood given   HOT HEMOSTASIS N/A 12/27/2015   Procedure: HOT HEMOSTASIS (ARGON PLASMA COAGULATION/BICAP);  Surgeon: Clarene Essex, MD;  Location: [facility];  Service: Endoscopy;  Laterality: N/A;   HOT HEMOSTASIS N/A 08/08/2019   Procedure: HOT HEMOSTASIS (ARGON PLASMA COAGULATION/BICAP);  Surgeon: Ronnette Juniper, MD;  Location: [place];  Service: Gastroenterology;  Laterality: N/A;   HOT HEMOSTASIS N/A 09/09/2020   Procedure: HOT HEMOSTASIS (ARGON PLASMA COAGULATION/BICAP);  Surgeon: Clarene Essex, MD;  Location: [place];  Service: Endoscopy;  Laterality: N/A;   ILIAC ARTERY STENT Left 05/04/2013   L CIA-EIA -- Dr. Gwenlyn Found   LEFT HEART CATH AND CORONARY ANGIOGRAPHY N/A 02/11/2019   Procedure: LEFT HEART CATH AND CORONARY ANGIOGRAPHY;  Surgeon: Leonie Man, MD;  Location: [place] CV LAB;;-  Patent RCA  stent with minimal ISR after pRCA 50%).  Small caliber RPAV with ~75%.  pLAD 30%, m-dLAD 40%, D2 40%.  Normal LVEDP and LVEF.   LOWER EXTREMITY ANGIOGRAM Bilateral 05/04/2013   Procedure: LOWER EXTREMITY ANGIOGRAM;  Surgeon: Lorretta Harp, MD;  Location: [facility] CATH LAB;  Service: Cardiovascular;  Laterality: Bilateral;   NM MYOVIEW LTD  09/07/2015   EF > 65%. Nonspecific ST changes but no ischemic changes. No ischemia or infarction. LOW RISK   PATCH ANGIOPLASTY Left 07/28/2019   Procedure: PATCH ANGIOPLASTY USING Rueben Bash BIOLOGIC PATCH;  Surgeon: Waynetta Sandy, MD;  Location: [place];  Service: Vascular;  Laterality: Left;   RENAL ANGIOGRAPHY N/A 01/15/2022   Procedure: RENAL ANGIOGRAPHY;  Surgeon: Waynetta Sandy, MD;  Location: [place] CV LAB;  Service: Cardiovascular;  Laterality: N/A;   TONSILLECTOMY  1960's   TRANSTHORACIC ECHOCARDIOGRAM  05/14/2019   Normal EF 60 to 65%.  No R WMA.   Normal RV but mildly elevated  pressures.  Relatively normal aortic and mitral valves.   UPPER GI ENDOSCOPY  12/14/2015   VIDEO BRONCHOSCOPY WITH RADIAL ENDOBRONCHIAL ULTRASOUND  06/06/2021   Procedure: RADIAL ENDOBRONCHIAL ULTRASOUND;  Surgeon: Garner Nash, DO;  Location: [facility] ENDOSCOPY;  Service: Pulmonary;;    Social History   Socioeconomic History   Marital status: Married    Spouse name: Not on file   Number of children: 1   Years of education: 12   Highest education level: High school graduate  Occupational History    Employer: RETIRED  Tobacco Use   Smoking status: Some Days    Types: Cigarettes    Last attempt to quit: 05/04/2013    Years since quitting: 8.7    Passive exposure: Never   Smokeless tobacco: Never   Tobacco comments:    She initially quit, but has gone back to smoking a couple cigarettes every now and then.  Vaping Use   Vaping Use: Never used  Substance and Sexual Activity   Alcohol use: Yes    Comment: occasionally 3 times a year   Drug use: No    Sexual activity: Not Currently    Partners: Male    Birth control/protection: None, Post-menopausal  Other Topics Concern   Not on file  Social History Narrative   Married. Has one child. Grandmother for a great-grandmother and 1.   No real exercise.   Occasional alcohol consumption.   Quit smoking inf Feb 2015 -- after L Iliac Stent   Right-handed   Caffeine: 1 cup every morning   Right-handed.   Lives at home with husband.   Social Determinants of Health   Financial Resource Strain: Not on file  Food Insecurity: Not on file  Transportation Needs: Not on file  Physical Activity: Not on file  Stress: Not on file  Social Connections: Not on file  Intimate Partner Violence: Not on file    Family History  Problem Relation Age of Onset   Diabetes Mother    Heart disease Mother    Heart attack Mother 39   Breast cancer Mother 31   Colon cancer Mother 41   Heart attack Father 30   Pneumonia Father    Diabetes Sister    Breast cancer Sister    Breast cancer Sister 61    Current Outpatient Medications  Medication Sig Dispense Refill   albuterol (VENTOLIN HFA) 108 (90 Base) MCG/ACT inhaler Inhale 2 puffs into the lungs every 6 (six) hours as needed for wheezing or shortness of breath.     alendronate (FOSAMAX) 70 MG tablet Take 70 mg by mouth once a week.     aspirin EC 81 MG tablet Take 1 tablet (81 mg total) by mouth daily. 30 tablet    atorvastatin (LIPITOR) 80 MG tablet Take 1 tablet (80 mg total) by mouth daily. 90 tablet 3   clopidogrel (PLAVIX) 75 MG tablet Take 1 tablet (75 mg total) by mouth daily. 30 tablet 3   doxycycline (VIBRA-TABS) 100 MG tablet Take 1 tablet (100 mg total) by mouth 2 (two) times daily. 14 tablet 0   ezetimibe (ZETIA) 10 MG tablet Take 1 tablet (10 mg total) by mouth daily. 90 tablet 3   ferrous gluconate (FERGON) 324 MG tablet Take 324 mg by mouth daily with breakfast.     furosemide (LASIX) 20 MG tablet Take 20 mg  One tablet on Monday ,  Wednesday , Fridays , may take an additional  20 mg ff needed 90 tablet  3   isosorbide mononitrate (IMDUR) 60 MG 24 hr tablet TAKE 1 TABLET BY MOUTH  DAILY 100 tablet 3   metoprolol succinate (TOPROL-XL) 100 MG 24 hr tablet Take 2 tablets (200 mg total) by mouth at bedtime. 180 tablet 3   nitroGLYCERIN (NITROSTAT) 0.4 MG SL tablet Place 1 tablet (0.4 mg total) under the tongue every 5 (five) minutes as needed for chest pain. 25 tablet 6   ondansetron (ZOFRAN) 4 MG tablet Take 1 tablet (4 mg total) by mouth every 6 (six) hours as needed for nausea. 20 tablet 0   pantoprazole (PROTONIX) 40 MG tablet Take 1 tablet (40 mg total) by mouth daily. 30 tablet 0   raloxifene (EVISTA) 60 MG tablet Take 60 mg by mouth daily.     valsartan (DIOVAN) 80 MG tablet Take 1 tablet (80 mg total) by mouth daily. 90 tablet 3   amLODipine (NORVASC) 5 MG tablet Take 1 tablet (5 mg total) by mouth daily. 90 tablet 3   No current facility-administered medications for this visit.    No Known Allergies   REVIEW OF SYSTEMS:  '[X]'$  denotes positive finding, '[ ]'$  denotes negative finding Cardiac  Comments:  Chest pain or chest pressure:    Shortness of breath upon exertion:    Short of breath when lying flat:    Irregular heart rhythm:        Vascular    Pain in calf, thigh, or hip brought on by ambulation:    Pain in feet at night that wakes you up from your sleep:     Blood clot in your veins:    Leg swelling:         Pulmonary    Oxygen at home:    Productive cough:     Wheezing:         Neurologic    Sudden weakness in arms or legs:     Sudden numbness in arms or legs:     Sudden onset of difficulty speaking or slurred speech:    Temporary loss of vision in one eye:     Problems with dizziness:         Gastrointestinal    Blood in stool:     Vomited blood:         Genitourinary    Burning when urinating:     Blood in urine:        Psychiatric    Major depression:         Hematologic     Bleeding problems:    Problems with blood clotting too easily:        Skin    Rashes or ulcers:        Constitutional    Fever or chills:      PHYSICAL EXAMINATION:  Vitals:   02/14/22 0906  BP: (!) 146/81  Pulse: (!) 56  Resp: 20  Temp: 97.6 F (36.4 C)  TempSrc: Temporal  SpO2: 90%  Weight: 120 lb (54.4 kg)  Height: '4\' 11"'$  (1.499 m)    General:  WDWN in NAD; vital signs documented above Gait: Normal HENT: WNL, normocephalic Pulmonary: normal non-labored breathing  Cardiac: regular HR, without  Murmurs without carotid bruit Abdomen: soft, NT, no masses Vascular Exam/Pulses:2+ radial pulses bilaterally, 2+ femoral pulses and DP pulses bilaterally; right CF access site without any swelling or hematoma Musculoskeletal: no muscle wasting or atrophy  Neurologic: A&O X 3;  No focal weakness or paresthesias are detected Psychiatric:  The  pt has Normal affect.   Non-Invasive Vascular Imaging:   Summary:  Renal: Right: Normal size right kidney. 1-59% stenosis of the right renal artery. RRV flow present.  Left:  Normal size of left kidney. 1-59% stenosis of the left renal artery. LRV flow present. Left lower pole heterogeneous spherical region with atypical presentation.    ASSESSMENT/PLAN:: 76 y.o. female here for follow up after Aortogram with Right renal artery stenting (5 x 12 mm Herculink) and Left renal artery stenting (5.5 x 12 mm Herculink) by Dr. Donzetta Matters on 01/15/22. This was performed secondary to advancing CKD. She was found to have 70% right RAD and 95% left RAS. Doing well post intervention. She takes BP regularly at home and just reports that her BP varies. No significant improvement in BP management. She has follow up next month with Nephrologist regarding renal function. Her duplex today shows bilateral patent renal artery stents. She will continue Aspirin, statin, Plavix. She will follow up in 9 months with repeat renal artery duplex   Karoline [place],  PA-C [facility] [phone]  Clinic MD:   [place]/ [facility]

## 2022-02-14 ENCOUNTER — Other Ambulatory Visit: Payer: Self-pay | Admitting: *Deleted

## 2022-02-14 ENCOUNTER — Ambulatory Visit (HOSPITAL_COMMUNITY)
Admission: RE | Admit: 2022-02-14 | Discharge: 2022-02-14 | Disposition: A | Discharge status: Home / Self Care | Payer: Medicare Other | Source: Ambulatory Visit | Attending: Vascular Surgery | Admitting: Vascular Surgery

## 2022-02-14 ENCOUNTER — Ambulatory Visit (INDEPENDENT_AMBULATORY_CARE_PROVIDER_SITE_OTHER): Payer: Medicare Other | Admitting: Physician Assistant

## 2022-02-14 VITALS — BP 146/81 | HR 56 | Temp 97.6°F | Resp 20 | Ht 59.0 in | Wt 120.0 lb

## 2022-02-14 DIAGNOSIS — I701 Atherosclerosis of renal artery: Secondary | ICD-10-CM

## 2022-02-14 DIAGNOSIS — N185 Chronic kidney disease, stage 5: Secondary | ICD-10-CM | POA: Diagnosis not present

## 2022-02-16 ENCOUNTER — Other Ambulatory Visit: Payer: Self-pay

## 2022-02-16 ENCOUNTER — Ambulatory Visit: Payer: Medicare Other | Attending: Cardiology | Admitting: Cardiology

## 2022-02-16 ENCOUNTER — Encounter: Payer: Self-pay | Admitting: Cardiology

## 2022-02-16 VITALS — BP 112/60 | HR 65 | Ht 59.0 in | Wt 119.4 lb

## 2022-02-16 DIAGNOSIS — E785 Hyperlipidemia, unspecified: Secondary | ICD-10-CM | POA: Diagnosis not present

## 2022-02-16 DIAGNOSIS — I252 Old myocardial infarction: Secondary | ICD-10-CM

## 2022-02-16 DIAGNOSIS — R55 Syncope and collapse: Secondary | ICD-10-CM | POA: Diagnosis not present

## 2022-02-16 DIAGNOSIS — I10 Essential (primary) hypertension: Secondary | ICD-10-CM

## 2022-02-16 DIAGNOSIS — R0609 Other forms of dyspnea: Secondary | ICD-10-CM | POA: Diagnosis not present

## 2022-02-16 DIAGNOSIS — R002 Palpitations: Secondary | ICD-10-CM

## 2022-02-16 DIAGNOSIS — R918 Other nonspecific abnormal finding of lung field: Secondary | ICD-10-CM

## 2022-02-16 DIAGNOSIS — I5032 Chronic diastolic (congestive) heart failure: Secondary | ICD-10-CM | POA: Diagnosis not present

## 2022-02-16 DIAGNOSIS — I25119 Atherosclerotic heart disease of native coronary artery with unspecified angina pectoris: Secondary | ICD-10-CM | POA: Diagnosis not present

## 2022-02-16 DIAGNOSIS — I701 Atherosclerosis of renal artery: Secondary | ICD-10-CM

## 2022-02-16 DIAGNOSIS — I2510 Atherosclerotic heart disease of native coronary artery without angina pectoris: Secondary | ICD-10-CM | POA: Diagnosis not present

## 2022-02-16 DIAGNOSIS — I200 Unstable angina: Secondary | ICD-10-CM | POA: Diagnosis not present

## 2022-02-16 DIAGNOSIS — Z9861 Coronary angioplasty status: Secondary | ICD-10-CM

## 2022-02-16 NOTE — Assessment & Plan Note (Deleted)
Most recent cath was in 2020 (3 years ago) showing patent stent with 75% stenosis in the small PAD branch.  As previously mentioned, Myoview and carotid Dopplers are unremarkable.  No current need for catheterization, given tenuous kidney status, would hold off on this as long as possible. Lipids still controlled on current statin  On aspirin  On Imdur, Toprol 200 mg, Amlodipine 5 mg, Lasix MWF, and valsartan 80 mg

## 2022-02-16 NOTE — Assessment & Plan Note (Signed)
Most recent cath was in 2020 (3 years ago) showing patent stent with 75% stenosis in the small PAD branch.  As previously mentioned, Myoview and carotid Dopplers are unremarkable.  No current need for catheterization, given tenuous kidney status, would hold off on this as long as possible. Lipids still controlled on current statin  On aspirin  On Imdur, Toprol 200 mg, Amlodipine 5 mg, Lasix MWF, and valsartan 80 mg  => Low threshold to consider titrating up amlodipine and reducing valsartan for additional microvascular vasodilation.  Could also potentially consider stopping Imdur as it does not have affected microvasculature.

## 2022-02-16 NOTE — Assessment & Plan Note (Signed)
Maintaining on aspirin monotherapy.  No longer on Thienopyridine.  Nonischemic Myoview would argue that the previously placed stents are patent.

## 2022-02-16 NOTE — Progress Notes (Signed)
ATTENDING ATTESTATION  I have seen, examined and evaluated the patient along with the Resident Physician Erskine Emery, MD) in clinic today.  I personally performed my own interview & exanimation.  After reviewing all the available data and chart, we discussed the patients laboratory, study & physical findings as well as symptoms in detail. I agree with her  findings, examination as well as impression recommendations as per our discussion.    Attending adjustments int the full clinic noted annotated in [place].   Kayla Mcclure presented today for 1 month follow-up to discuss results of her studies.  She is also undergone right and left renal artery stenting by Dr. Donzetta Matters.  She still has similar symptoms that she had at their initial visit.  Thankfully, her Myoview was nonischemic.  Her monitor did not show any significant findings.  There is evidence to suggest potential pulmonary pretension although it the echo could not fully evaluate it.  Conflicting data on the echo as far as suggestive of grade 2 diastolic function, normal left atrial size.  The fact that she is not using additional diuretic would argue against CHF as a major component of her dyspnea.  I suspect that a lot of her chest pain is probably musculoskeletal or respiratory in nature from her bronchiectasis, but cannot exclude microvascular ischemia.  For now would like to just continue current medications and let her follow-up with pulmonary medicine.    Syncope Carotid dopplers and echocardiogram reassuring. Echo with 60 to 65% ejection fraction.  There is abnormal relaxation with grade 2 diastolic dysfunction and increased pressures in the right side of the heart, moderate regurgitation of the tricuspid valve and moderately dilated right atrium. Zio without bradycardia, documented abnormal rhythm (SVT) for 188 episodes but no sustained arrhythmias.  Nothing to explain the symptoms of syncope or near syncope.  Progressive angina (HCC) -> with  nonischemic Myoview. Unsure of cause of progressive angina, and change from previous.  It appears that the patient has bronchiectasis via CT scan obtained by pulmonology.  This could be contributing to her angina.  Reassuringly, stress test was unremarkable and revealed no evidence of ischemic changes.  We will continue to monitor symptomatically.  Nonischemic Myoview would argue against macrovascular CAD.  It is possible that she is feeling short-lived chest pain from the multiple atrial runs noted on monitor.  It is also possible that is microvascular disease. She is on high-dose Toprol with modest dose of losartan.  She is also on amlodipine which we can potentially increase in the well of the valsartan and for potential microvascular and pulmonary serial dilation. We could consider potentially using Ranexa if symptoms persist.  Dyslipidemia, goal LDL below 70 02/06/2022: TC 114, TG 71, HDL 54, LDLc 46. Stable on high dose atorvastatin, for now continue current meds.  Tolerating statin with no myalgias.  Essential hypertension, benign Well controlled today. Toprol 200 mg nightly, valsartan 80 mg daily, amlodipine 5 mg daily, Imdur 60 mg daily and Monday Wednesday Friday Lasix. Had aortogram with right renal artery stenting and left renal artery stenting by Dr. Donzetta Matters on 01/15/22. Patient had 70% right RAD and 95% left RAS.  Continue with the current regimen and monitor after stent placement. Hopeful, that she received some effect from the stenting.  If this process fails to improve her hypertension, she may need HD.  Sees nephrology in 1 month.   Would like to reserve the possibility of titrating up calcium channel blocker in lieu of ARB for microvascular benefit, however  would welcome the advice of nephrologist.  History of MI (myocardial infarction)  - NSTEMI with PCI of RCA. Distant history of MI with RCA PCI that was patent by cath 3 years ago.  She did have some distal vessel disease that seem  to be relatively small caliber and not worth PCI. Myoview and 2D echo reassuringly mostly unremarkable.   Thankfully, no wall motion abnormalities noted on Echo, or nfarction noted on /Myoview.  DOE (dyspnea on exertion) Patient saw pulmonology and had a CT scan of the chest that revealed: "There is some progressive nodularity in the right upper lobe including a new 0.6 by 0.7 cm right upper lobe nodule. These nodules could be due to atypical infection, entity such as sarcoidosis, or neoplastic. Substantial airway thickening with scattered regions of cystic or varicoid bronchiectasis in both lungs."  She was treated with Mucinex and doxycycline for bronchiectasis.  The patient lost her doxycycline prior to finishing the course.  These findings on CT scan could be contributing to her dyspnea on exertion in addition to her intermittent chest pain.  Will defer to pulmonology expertise for this.  Instructed patient to call for repeat doxycycline prescription. - Continue with doxycycline per pulmonology - Continue with Mucinex - Updated CT scan in 3 months  Clearly multifactorial, but pulmonary etiology is probably the most likely.  No ischemia noted on Myoview and normal wall motion on echo.  Grade 2 diastolic dysfunction noted on echo but strangely, the left atrial size was normal.  This does not make sense.   The fact that she is only using limited doses of furosemide-3 days a week with no PRN dosing would argue against volume overload from left ventricular systolic or diastolic dysfunction.  There was evidence of moderate to severe TR which would suggest elevated PAP.  This would suggest a primary pulmonary issue and potential associated pulmonary hypertension.  Defer management to pulmonary medicine, but if there is concern of pulmonary hypertension, we could potentially consider right heart cath..   Lung nodules Follows with pulmonology, has updated CT scan in 3 months per pulmonology  notes.  Coronary artery disease involving native coronary artery of native heart with angina pectoris ([place]) Most recent cath was in 2020 (3 years ago) showing patent stent with 75% stenosis in the small PAD branch.  As previously mentioned, Myoview and carotid Dopplers are unremarkable.  No current need for catheterization, given tenuous kidney status, would hold off on this as long as possible. Lipids still controlled on current statin  On aspirin  On Imdur, Toprol 200 mg, Amlodipine 5 mg, Lasix MWF, and valsartan 80 mg  => Low threshold to consider titrating up amlodipine and reducing valsartan for additional microvascular vasodilation.  Could also potentially consider stopping Imdur as it does not have affected microvasculature.  CAD S/P percutaneous coronary angioplasty -- Promus DES 2.5 mm x 20 mm postdilated to 3 mm Maintaining on aspirin monotherapy.  No longer on Thienopyridine.  Nonischemic Myoview would argue that the previously placed stents are patent.  Chronic diastolic CHF (congestive heart failure) (HCC) On high-dose Toprol along with modest dose valsartan and and amlodipine.  She takes Lasix 3 days a week and has not had to use any additional dosing.  Would indicate that she is relatively euvolemic.  This would argue that there is less component of left-sided failure leading to elevated PAP.     [name], MD

## 2022-02-16 NOTE — Assessment & Plan Note (Addendum)
Well controlled today. Toprol 200 mg nightly, valsartan 80 mg daily, amlodipine 5 mg daily, Imdur 60 mg daily and Monday Wednesday Friday Lasix. Had aortogram with right renal artery stenting and left renal artery stenting by Dr. Donzetta Matters on 01/15/22. Patient had 70% right RAD and 95% left RAS.  Continue with the current regimen and monitor after stent placement. Hopeful, that she received some effect from the stenting.  If this process fails to improve her hypertension, she may need HD.  Sees nephrology in 1 month.   Would like to reserve the possibility of titrating up calcium channel blocker in lieu of ARB for microvascular benefit, however would welcome the advice of nephrologist.

## 2022-02-16 NOTE — Assessment & Plan Note (Addendum)
02/06/2022: TC 114, TG 71, HDL 54, LDLc 46. Stable on high dose atorvastatin, for now continue current meds.  Tolerating statin with no myalgias.

## 2022-02-16 NOTE — Assessment & Plan Note (Addendum)
Carotid dopplers and echocardiogram reassuring. Echo with 60 to 65% ejection fraction.  There is abnormal relaxation with grade 2 diastolic dysfunction and increased pressures in the right side of the heart, moderate regurgitation of the tricuspid valve and moderately dilated right atrium. Zio without bradycardia, documented abnormal rhythm (SVT) for 188 episodes but no sustained arrhythmias.  Nothing to explain the symptoms of syncope or near syncope.

## 2022-02-16 NOTE — Assessment & Plan Note (Addendum)
Distant history of MI with RCA PCI that was patent by cath 3 years ago.  She did have some distal vessel disease that seem to be relatively small caliber and not worth PCI. Myoview and 2D echo reassuringly mostly unremarkable.   Thankfully, no wall motion abnormalities noted on Echo, or nfarction noted on /Myoview.

## 2022-02-16 NOTE — Progress Notes (Signed)
Primary Care Provider: Lois Huxley, [place] Cardiologist: [name], MD Electrophysiologist: None  Clinic Note: Chief Complaint  Patient presents with   Follow-up    1 month, discussed results of tests   Coronary Artery Disease    Complaints of exertional chest pain and dyspnea.    ===================================  ASSESSMENT/PLAN   Problem List Items Addressed This Visit       Cardiology Problems   CAD S/P percutaneous coronary angioplasty -- Promus DES 2.5 mm x 20 mm postdilated to 3 mm (Chronic)    Maintaining on aspirin monotherapy.  No longer on Thienopyridine.  Nonischemic Myoview would argue that the previously placed stents are patent.      Coronary artery disease involving native coronary artery of native heart with angina pectoris ([place]) - Primary (Chronic)    Most recent cath was in 2020 (3 years ago) showing patent stent with 75% stenosis in the small PAD branch.  As previously mentioned, Myoview and carotid Dopplers are unremarkable.  No current need for catheterization, given tenuous kidney status, would hold off on this as long as possible. Lipids still controlled on current statin  On aspirin  On Imdur, Toprol 200 mg, Amlodipine 5 mg, Lasix MWF, and valsartan 80 mg  => Low threshold to consider titrating up amlodipine and reducing valsartan for additional microvascular vasodilation.  Could also potentially consider stopping Imdur as it does not have affected microvasculature.      Progressive angina (HCC) -> with nonischemic Myoview. (Chronic)    Unsure of cause of progressive angina, and change from previous.  It appears that the patient has bronchiectasis via CT scan obtained by pulmonology.  This could be contributing to her angina.  Reassuringly, stress test was unremarkable and revealed no evidence of ischemic changes.  We will continue to monitor symptomatically.  Nonischemic Myoview would argue against macrovascular CAD.  It  is possible that she is feeling short-lived chest pain from the multiple atrial runs noted on monitor.  It is also possible that is microvascular disease. She is on high-dose Toprol with modest dose of losartan.  She is also on amlodipine which we can potentially increase in the well of the valsartan and for potential microvascular and pulmonary serial dilation. We could consider potentially using Ranexa if symptoms persist.      Dyslipidemia, goal LDL below 70 (Chronic)    02/06/2022: TC 114, TG 71, HDL 54, LDLc 46. Stable on high dose atorvastatin, for now continue current meds.  Tolerating statin with no myalgias.      Essential hypertension, benign (Chronic)    Well controlled today. Toprol 200 mg nightly, valsartan 80 mg daily, amlodipine 5 mg daily, Imdur 60 mg daily and Monday Wednesday Friday Lasix. Had aortogram with right renal artery stenting and left renal artery stenting by Dr. Donzetta Matters on 01/15/22. Patient had 70% right RAD and 95% left RAS.  Continue with the current regimen and monitor after stent placement. Hopeful, that she received some effect from the stenting.  If this process fails to improve her hypertension, she may need HD.  Sees nephrology in 1 month.   Would like to reserve the possibility of titrating up calcium channel blocker in lieu of ARB for microvascular benefit, however would welcome the advice of nephrologist.      Chronic diastolic CHF (congestive heart failure) (HCC) (Chronic)    On high-dose Toprol along with modest dose valsartan and and amlodipine.  She takes Lasix 3 days a week and  has not had to use any additional dosing.  Would indicate that she is relatively euvolemic.  This would argue that there is less component of left-sided failure leading to elevated PAP.        Other   DOE (dyspnea on exertion) (Chronic)    Patient saw pulmonology and had a CT scan of the chest that revealed: "There is some progressive nodularity in the right upper lobe including  a new 0.6 by 0.7 cm right upper lobe nodule. These nodules could be due to atypical infection, entity such as sarcoidosis, or neoplastic. Substantial airway thickening with scattered regions of cystic or varicoid bronchiectasis in both lungs."  She was treated with Mucinex and doxycycline for bronchiectasis.  The patient lost her doxycycline prior to finishing the course.  These findings on CT scan could be contributing to her dyspnea on exertion in addition to her intermittent chest pain.  Will defer to pulmonology expertise for this.  Instructed patient to call for repeat doxycycline prescription. - Continue with doxycycline per pulmonology - Continue with Mucinex - Updated CT scan in 3 months  Clearly multifactorial, but pulmonary etiology is probably the most likely.  No ischemia noted on Myoview and normal wall motion on echo.  Grade 2 diastolic dysfunction noted on echo but strangely, the left atrial size was normal.  This does not make sense.   The fact that she is only using limited doses of furosemide-3 days a week with no PRN dosing would argue against volume overload from left ventricular systolic or diastolic dysfunction.  There was evidence of moderate to severe TR which would suggest elevated PAP.  This would suggest a primary pulmonary issue and potential associated pulmonary hypertension.  Defer management to pulmonary medicine, but if there is concern of pulmonary hypertension, we could potentially consider right heart cath..       History of MI (myocardial infarction)  - NSTEMI with PCI of RCA. (Chronic)    Distant history of MI with RCA PCI that was patent by cath 3 years ago.  She did have some distal vessel disease that seem to be relatively small caliber and not worth PCI. Myoview and 2D echo reassuringly mostly unremarkable.   Thankfully, no wall motion abnormalities noted on Echo, or nfarction noted on /Myoview.      Lung nodules    Follows with pulmonology, has updated  CT scan in 3 months per pulmonology notes.      Palpitations   Syncope    Carotid dopplers and echocardiogram reassuring. Echo with 60 to 65% ejection fraction.  There is abnormal relaxation with grade 2 diastolic dysfunction and increased pressures in the right side of the heart, moderate regurgitation of the tricuspid valve and moderately dilated right atrium. Zio without bradycardia, documented abnormal rhythm (SVT) for 188 episodes but no sustained arrhythmias.  Nothing to explain the symptoms of syncope or near syncope.      Kayla Mcclure presented today for 1 month follow-up to discuss results of her studies.  She is also undergone right and left renal artery stenting by Dr. Donzetta Matters.  She still has similar symptoms that she had at their initial visit.  Thankfully, her Myoview was nonischemic.  Her monitor did not show any significant findings.  There is evidence to suggest potential pulmonary pretension although it the echo could not fully evaluate it.  Conflicting data on the echo as far as suggestive of grade 2 diastolic function, normal left atrial size.  The fact that she is not  using additional diuretic would argue against CHF as a major component of her dyspnea.   I suspect that a lot of her chest pain is probably musculoskeletal or respiratory in nature from her bronchiectasis, but cannot exclude microvascular ischemia.  For now would like to just continue current medications and let her follow-up with pulmonary medicine.   [name], MD  ===================================  HPI:    Kayla Mcclure is a 76 y.o. female with a PMH below who presents today for 1 month follow-up to discuss results of studies..  CV HISTORY CAD (progressive angina/NSTEMI)-DES PCI RCA (2009) Relook cath February 11, 2019: Patent RCA stent.  Small caliber RPL V with 75% lesion.  Minimal left-sided disease. ? HFpEF: Echo February 2021 with EF at 60 to 65%.  No RWMA.  Elevated LV EDP and RVP. PAD: PTCA  of the L Com & External Iliac A.  (Dr. Gwenlyn Found) Recenet dopplers: Bilateral Common Iliac and Common Femoral Arteries 50 to 69% Dopplers.  Clinical claudication => med Rx Bilateral Carotid Disease -(Dr. Donzetta Matters) => S/p L CEA in April 2021; S/p R CEA in May 2021. H/o Iron Def Anemia - Recurrent GIB (? AVMs) -- initial presentation was Syncope. Bilat RAS -> s.p R&L RA stent 01/15/2022 HTN/HLD Former heavy smoker with chronic dyspnea -> unfortunately, she relapsed CKD 3-most recent creatinine 2.01 -> has planned HD fistula graft procedure  Significant weight loss Pulmonary nodules  Kayla Mcclure was last seen on 01/08/2022.  At that time, she reported dyspnea on exertion in addition to chest tightness that was new for her.  She also noted that she has dizziness with a moment of blacking out.  She was then ordered a Myoview stress test, echocardiogram, Zio patch.  Recent Hospitalizations:  06/06/2021: Robotic Assisted Navigational Bronchoscopy-biopsy left upper lobe and right lower-medial lobe => needle brushings, Wang needle biopsies and transbronchial forcep biopsies.=> Pathology was negative for malignancy. 01/15/22: Patient had 70% right RAD and 95% left RAS noted on ultrasound.Kayla Mcclure with right renal artery stenting and left renal artery stenting by Dr. Donzetta Matters on 01/15/22. >>Follow up with [facility] showed patent stents   Reviewed  CV studies:    The following studies were reviewed today: (if available, images/films reviewed: From Epic Chart or Care Everywhere)  CT chest 01/29/22:  1. The dominant right perifissural nodule is stable in size from 09/15/2021, currently 1.1 by 0.7 cm and previously had a maximum SUV of 4.6.  2. The left upper lobe nodule is stable in size compared to 09/15/2021, and previously had a maximum SUV of 4.5. 3. There is some progressive nodularity in the right upper lobe including a new 0.6 by 0.7 cm right upper lobe nodule. 4. These nodules could be due to atypical  infection, entity such as sarcoidosis, or neoplastic. 5. Other scattered pulmonary nodules are stable. 6. Stable mild mediastinal adenopathy. 7. Substantial airway thickening with scattered regions of cystic  varicoid bronchiectasis in both lungs, favoring the upper lobes. 8. Prominent main pulmonary artery suggesting pulmonary arterial hypertension. 9. Mild cardiomegaly. Coronary, aortic arch, and branch vessel atherosclerotic vascular disease. 10. Thoracic spondylosis. 11. Benign Bosniak category 2 left renal cyst. This warrants no further workup. 12. Aortic Atherosclerosis (ICD10-I70.0).  2D Echo 01/08/2022: Normal LV size and function.  EF 60 to 65%.  No RWMA.  GR 2 DD (but normal LA size? ->  Not consistent).  Mildly enlarged RV with mildly reduced function.  Unable to assess RVP, but moderate to severe TR with  suggest elevated PAP.Marland Kitchen  Moderate RA dilation and normal RAP..  Mild MAC with no MS. moderate AOV calcification with mild to moderate AI.  No AS.  Lexiscan Myoview 02-01-22: Normal LV function (read as hyperdynamic with EF 77%-likely related to small cavity size).  NO ISCHEMIA OR INFARCTION..  LOW RISK.  Prominent RV uptake.   Interval History:   Kayla Mcclure presenting for follow up   Recently saw pulmonology and had CT scan performed 2 weeks ago  She is smoking about 1 cigarette every 1-2 days.   She still has no real change in the symptoms from her last visit.  Still having these intermittent episodes of chest discomfort.  She is happy to see that her blood pressure looks much better today, and is hopeful that renal artery stenting will help her renal function stay preserved.  The interesting thing about her chest discomfort and that she is not necessarily having any consistent symptoms.  They are random and can occur with or without exertion and are relatively short-lived.  Minimal palpitations noted.  We reviewed the results of her monitor.  Also reviewed her pulmonary  results.   CV Review of Symptoms (Summary): Cardiovascular ROS: positive for - chest pain and dyspnea on exertion Negative-edema, irregular heartbeat, orthopnea, palpitations, rapid heart rate, or TIA/amaurosis fugax with stable claudication.   REVIEWED OF SYSTEMS   Review of Systems  Constitutional:  Negative for chills, fever and malaise/fatigue.  Respiratory:  Positive for cough, shortness of breath and wheezing.   Cardiovascular:  Positive for chest pain (intermittent, stable) and orthopnea (mild). Negative for leg swelling.  Gastrointestinal:  Positive for abdominal pain (s/p stent placement). Negative for nausea and vomiting.  Genitourinary:  Negative for dysuria, frequency and urgency.  Musculoskeletal:  Negative for myalgias.  Neurological:  Negative for seizures and weakness.    I have reviewed and (if needed) personally updated the patient's problem list, medications, allergies, past medical and surgical history, social and family history.   PAST MEDICAL HISTORY   Past Medical History:  Diagnosis Date   Acute GI bleeding 08/07/2019   Asthma 10/31/2007   CAD (coronary artery disease) 08/05/2007   a) NSTEMI 2009: PCI to the RCA Promus DES 2.5 mm x 23 mm (3.0 mm). b) Myoview June 2017: Nonspecific ST changes. EF greater than 65%. LOW RISK, normal study. No ischemia or infarction.   Carotid artery disease [facility])    s/p Bilateral CEA March & May 2020.    Chronic back pain    Chronic diastolic heart failure (HCC)    CKD (chronic kidney disease), stage II    CKD (chronic kidney disease), stage III ([place])    Diverticulosis 04/11/2010   Colonoscopy   Dyslipidemia, goal LDL below 70    Essential hypertension    a. Normal renal arteries by PV angio 05/2013.   GERD (gastroesophageal reflux disease)    GI bleed 12/2015   History of kidney stones 1975   surgery to remove   History of recent blood transfusion 12/2015   12-19-15, 12-20-15 and 12-21-15   Insomnia    Internal  hemorrhoids 04/11/2010   By colonoscopy   Iron deficiency anemia    Lung nodules 05/2021   bilateral   Myocardial infarction [facility]) 2009   Peripheral arterial disease ([place])    a. s/p PTA and stenting of L CIA stenosis 05/2013.   Renal insufficiency    Stroke [facility])    mini stroke   Tobacco abuse    Quit  in 05/2013    PAST SURGICAL HISTORY   Past Surgical History:  Procedure Laterality Date   BRONCHIAL BIOPSY  06/06/2021   Procedure: BRONCHIAL BIOPSIES;  Surgeon: Garner Nash, DO;  Location: [place] ENDOSCOPY;  Service: Pulmonary;;   BRONCHIAL BRUSHINGS  06/06/2021   Procedure: BRONCHIAL BRUSHINGS;  Surgeon: Garner Nash, DO;  Location: [place] ENDOSCOPY;  Service: Pulmonary;;   BRONCHIAL NEEDLE ASPIRATION BIOPSY  06/06/2021   Procedure: BRONCHIAL NEEDLE ASPIRATION BIOPSIES;  Surgeon: Garner Nash, DO;  Location: [place] ENDOSCOPY;  Service: Pulmonary;;   CAPSULE ENDOSCOPY  10/27/2019   [facility]) evidence of "pinhole leaking" in both stomach and proximal small bowel; no overt bleeding noted   CHOLECYSTECTOMY  1975   COLONOSCOPY  04/11/2010   COLONOSCOPY  11/30/2015   CORONARY ANGIOPLASTY WITH STENT PLACEMENT  08/05/2007   Promus DES 2.5 mm x 23 mm (3 mm)  to RCA; EF 60-70%   ENDARTERECTOMY Left 07/28/2019   Procedure: ENDARTERECTOMY CAROTID;  Surgeon: Waynetta Sandy, MD;  Location: [place];  Service: Vascular;  Laterality: Left;   ENDARTERECTOMY Right 08/27/2019   Procedure: ENDARTERECTOMY CAROTID RIGHT WITH PATCH ANGIOPLASTY;  Surgeon: Waynetta Sandy, MD;  Location: [place];  Service: Vascular;  Laterality: Right;   ENTEROSCOPY N/A 12/27/2015   Procedure: ENTEROSCOPY;  Surgeon: Clarene Essex, MD;  Location: [facility] ENDOSCOPY;  Service: Endoscopy;  Laterality: N/A;  also Mcclure slim egd scope   ENTEROSCOPY N/A 05/15/2019   Procedure: ENTEROSCOPY;  Surgeon: Ronald Lobo, MD;  Location: [place];  Service: Endoscopy;  Laterality: N/A;  Push enteroscopy using either ultraslim  colonoscope or push enteroscope   ENTEROSCOPY N/A 08/08/2019   Procedure: ENTEROSCOPY;  Surgeon: Ronnette Juniper, MD;  Location: [place];  Service: Gastroenterology;  Laterality: N/A;   ESOPHAGOGASTRODUODENOSCOPY N/A 09/13/2013   Procedure: ESOPHAGOGASTRODUODENOSCOPY (EGD);  Surgeon: Lear Ng, MD;  Location[facility] ENDOSCOPY;  Service: Endoscopy;  Laterality: N/A;   ESOPHAGOGASTRODUODENOSCOPY N/A 07/14/2015   Procedure: ESOPHAGOGASTRODUODENOSCOPY (EGD);  Surgeon: Clarene Essex, MD;  Location: [facility] ENDOSCOPY;  Service: Endoscopy;  Laterality: N/A;   ESOPHAGOGASTRODUODENOSCOPY (EGD) WITH PROPOFOL N/A 09/09/2020   Procedure: ESOPHAGOGASTRODUODENOSCOPY (EGD) WITH PROPOFOL;  Surgeon: Clarene Essex, MD;  Location: [place];  Service: Endoscopy;  Laterality: N/A;   FIDUCIAL MARKER PLACEMENT  06/06/2021   Procedure: FIDUCIAL MARKER PLACEMENT;  Surgeon: Garner Nash, DO;  Location: [place];  Service: Pulmonary;;   gi bleed  07/2015   blood given   HOT HEMOSTASIS N/A 12/27/2015   Procedure: HOT HEMOSTASIS (ARGON PLASMA COAGULATION/BICAP);  Surgeon: Clarene Essex, MD;  Location: [facility];  Service: Endoscopy;  Laterality: N/A;   HOT HEMOSTASIS N/A 08/08/2019   Procedure: HOT HEMOSTASIS (ARGON PLASMA COAGULATION/BICAP);  Surgeon: Ronnette Juniper, MD;  Location: [place];  Service: Gastroenterology;  Laterality: N/A;   HOT HEMOSTASIS N/A 09/09/2020   Procedure: HOT HEMOSTASIS (ARGON PLASMA COAGULATION/BICAP);  Surgeon: Clarene Essex, MD;  Location: [place];  Service: Endoscopy;  Laterality: N/A;   ILIAC ARTERY STENT Left 05/04/2013   L CIA-EIA -- Dr. Gwenlyn Found   LEFT HEART CATH AND CORONARY ANGIOGRAPHY N/A 02/11/2019   Procedure: LEFT HEART CATH AND CORONARY ANGIOGRAPHY;  Surgeon: Leonie Man, MD;  Location: [place] CV LAB;;-  Patent RCA stent with minimal ISR after pRCA 50%).  Small caliber RPAV with ~75%.  pLAD 30%, m-dLAD 40%, D2 40%.  Normal LVEDP and LVEF.   LOWER EXTREMITY ANGIOGRAM  Bilateral 05/04/2013   Procedure: LOWER EXTREMITY ANGIOGRAM;  Surgeon: Lorretta Harp, MD;  Location: [facility] CATH LAB;  Service: Cardiovascular;  Laterality: Bilateral;   NM MYOVIEW LTD  09/07/2015   EF > 65%. Nonspecific ST changes but no ischemic changes. No ischemia or infarction. LOW RISK   PATCH ANGIOPLASTY Left 07/28/2019   Procedure: PATCH ANGIOPLASTY USING Rueben Bash BIOLOGIC PATCH;  Surgeon: Waynetta Sandy, MD;  Location: [place];  Service: Vascular;  Laterality: Left;   RENAL ANGIOGRAPHY N/A 01/15/2022   Procedure: RENAL ANGIOGRAPHY;  Surgeon: Waynetta Sandy, MD;  Location: [place] CV LAB;  Service: Cardiovascular;  Laterality: N/A;   TONSILLECTOMY  1960's   TRANSTHORACIC ECHOCARDIOGRAM  05/14/2019   Normal EF 60 to 65%.  No R WMA.   Normal RV but mildly elevated pressures.  Relatively normal aortic and mitral valves.   UPPER GI ENDOSCOPY  12/14/2015   VIDEO BRONCHOSCOPY WITH RADIAL ENDOBRONCHIAL ULTRASOUND  06/06/2021   Procedure: RADIAL ENDOBRONCHIAL ULTRASOUND;  Surgeon: Garner Nash, DO;  Location: [place] ENDOSCOPY;  Service: Pulmonary;;    Immunization History  Administered Date(s) Administered   Influenza Whole 02/26/2007, 02/04/2009, 01/10/2010   Influenza, High Dose Seasonal PF 12/25/2013, 03/24/2015, 01/02/2016, 03/06/2017, 01/09/2018, 01/14/2019, 01/15/2020, 01/25/2021   Influenza, Seasonal, Injecte, Preservative Fre 06/09/2012   Influenza-Unspecified 12/01/2012   PFIZER(Purple Top)SARS-COV-2 Vaccination 06/23/2019, 07/14/2019, 01/11/2020, 07/12/2020   Pfizer Covid-19 Vaccine Bivalent Booster 61yr & up 12/20/2020   Pneumococcal Conjugate-13 09/11/2016   Pneumococcal Polysaccharide-23 04/02/2005, 06/09/2012, 09/12/2013, 01/09/2018   Td 12/21/2008    MEDICATIONS/ALLERGIES   Current Meds  Medication Sig   alendronate (FOSAMAX) 70 MG tablet Take 70 mg by mouth once a week.   amLODipine (NORVASC) 5 MG tablet Take 1 tablet (5 mg total) by mouth daily.    aspirin EC 81 MG tablet Take 1 tablet (81 mg total) by mouth daily.   atorvastatin (LIPITOR) 80 MG tablet Take 1 tablet (80 mg total) by mouth daily.   clopidogrel (PLAVIX) 75 MG tablet Take 1 tablet (75 mg total) by mouth daily.   doxycycline (VIBRA-TABS) 100 MG tablet Take 1 tablet (100 mg total) by mouth 2 (two) times daily.   ezetimibe (ZETIA) 10 MG tablet Take 1 tablet (10 mg total) by mouth daily.   ferrous gluconate (FERGON) 324 MG tablet Take 324 mg by mouth daily with breakfast.   furosemide (LASIX) 20 MG tablet Take 20 mg  One tablet on Monday , Wednesday , Fridays , may take an additional  20 mg ff needed   isosorbide mononitrate (IMDUR) 60 MG 24 hr tablet TAKE 1 TABLET BY MOUTH  DAILY   metoprolol succinate (TOPROL-XL) 100 MG 24 hr tablet Take 2 tablets (200 mg total) by mouth at bedtime.   pantoprazole (PROTONIX) 40 MG tablet Take 1 tablet (40 mg total) by mouth daily.   raloxifene (EVISTA) 60 MG tablet Take 60 mg by mouth daily.   valsartan (DIOVAN) 80 MG tablet Take 1 tablet (80 mg total) by mouth daily.    No Known Allergies  SOCIAL HISTORY/FAMILY HISTORY   Reviewed in Epic:  Pertinent findings:  Social History   Tobacco Use   Smoking status: Some Days    Types: Cigarettes    Last attempt to quit: 05/04/2013    Years since quitting: 8.7    Passive exposure: Never   Smokeless tobacco: Never   Tobacco comments:    She initially quit, but has gone back to smoking a couple cigarettes every now and then.    02/16/2022 Patient states "I smoke 1-2 about every two to three  days.   Vaping Use   Vaping Use: Never used  Substance Use Topics   Alcohol use: Yes    Comment: occasionally 3 times a year   Drug use: No   Social History   Social History Narrative   Married. Has one child. Grandmother for a great-grandmother and 1.   No real exercise.   Occasional alcohol consumption.   Quit smoking inf Feb 2015 -- after L Iliac Stent   Right-handed   Caffeine: 1 cup  every morning   Right-handed.   Lives at home with husband.    OBJCTIVE -PE, EKG, labs   Wt Readings from Last 3 Encounters:  02/16/22 119 lb 6.4 oz (54.2 kg)  02/14/22 120 lb (54.4 kg)  02/02/22 124 lb (56.2 kg)    Physical Exam: BP 112/60 (BP Location: Left Arm, Patient Position: Sitting, Cuff Size: Normal)   Pulse 65   Ht _0  (1.499 m)   Wt 119 lb 6.4 oz (54.2 kg)   SpO2 91%   BMI 24.12 kg/m  Physical Exam Constitutional:      General: She is not in acute distress.    Appearance: Normal appearance. She is not toxic-appearing.  HENT:     Head: Normocephalic and atraumatic.     Nose: Nose normal.     Mouth/Throat:     Mouth: Mucous membranes are moist.  Cardiovascular:     Rate and Rhythm: Normal rate and regular rhythm.  Pulmonary:     Effort: Pulmonary effort is normal.     Comments: Slight expiratory wheeze  Abdominal:     General: Bowel sounds are normal. There is no distension.     Palpations: Abdomen is soft. There is no mass.  Musculoskeletal:        General: Normal range of motion.  Skin:    General: Skin is warm.     Capillary Refill: Capillary refill takes less than 2 seconds.  Neurological:     Mental Status: She is alert.  Psychiatric:        Mood and Affect: Mood normal.        Behavior: Behavior normal.      Adult ECG Report  Rate:  ;  Rhythm: normal sinus rhythm;   Narrative Interpretation:   Recent Labs:    02/06/2022: TC 114, TG 71, HDL 54, LDLc 46. Lab Results  Component Value Date   CREATININE 1.40 (H) 01/15/2022   BUN 38 (H) 01/15/2022   NA 140 01/15/2022   K 5.0 01/15/2022   CL 104 01/15/2022   CO2 24 06/06/2021      Latest Ref Rng & Units 01/15/2022    8:30 AM 06/06/2021    1:02 PM 09/09/2020   12:15 AM  CBC  WBC 4.0 - 10.5 K/uL  6.4  6.0   Hemoglobin 12.0 - 15.0 g/dL 13.3  12.8  11.0   Hematocrit 36.0 - 46.0 % 39.0  40.6  33.9   Platelets 150 - 400 K/uL  165  134     Lab Results  Component Value Date   HGBA1C 6.4  (H) 05/15/2019   Lab Results  Component Value Date   TSH 2.161 04/28/2013    ================================================== Total Resident Physician time with the patient spent in direct patient consultation: 20 minutes plus additional 5 minutes with attending physician. Additional time spent with chart review  / charting (studies, outside notes, etc): 10 min for resident physician and 20 minutes for attending physician. Total Time: 55 min  Current medicines are reviewed at length with the patient today.  (+/- concerns)   Notice: This dictation was prepared with Dragon dictation along with smart phrase technology. Any transcriptional errors that result from this process are unintentional and may not be corrected upon review.  Studies Ordered:   No orders of the defined types were placed in this encounter.  No orders of the defined types were placed in this encounter.   Patient Instructions / Medication Changes & Studies & Tests Ordered   Patient Instructions  Medication Instructions:   No changes  *If you need a refill on your cardiac medications before your next appointment, please call your pharmacy*   Lab Work: Not needed    Testing/Procedures:  Not needed  Follow-Up: At [facility], you and your health [place] are our priority.  As part of our continuing mission to provide you with exceptional heart care, we have created designated Provider Care Teams.  These Care Teams include your primary Cardiologist (physician) and Advanced Practice Providers (APPs -  Physician Assistants and Nurse Practitioners) who all work together to provide you with the care you need, when you need it.     Your next appointment:   6 month(s)  The format for your next appointment:   In Person  Provider:   [name], MD     Signed,  Erskine Emery, MD  PY2- FM   ATTENDING ATTESTATION  I have seen, examined and evaluated the patient along with the Resident Physician  in clinic today.  I personally performed my own interview & exanimation.  After reviewing all the available data and chart, we discussed the patients laboratory, study & physical findings as well as symptoms in detail. I agree with her findings, examination as well as impression recommendations as per our discussion.    Attending adjustments int the full clinic noted annotated in [place].      Leonie Man, MD, MS [name], M.D., M.S. Interventional Cardiologist  [place]  Pager # [phone] Phone # [phone] [address] [place],  [address]    Thank you for choosing [facility]!!

## 2022-02-16 NOTE — Assessment & Plan Note (Addendum)
Unsure of cause of progressive angina, and change from previous.  It appears that the patient has bronchiectasis via CT scan obtained by pulmonology.  This could be contributing to her angina.  Reassuringly, stress test was unremarkable and revealed no evidence of ischemic changes.  We will continue to monitor symptomatically.  Nonischemic Myoview would argue against macrovascular CAD.  It is possible that she is feeling short-lived chest pain from the multiple atrial runs noted on monitor.  It is also possible that is microvascular disease. She is on high-dose Toprol with modest dose of losartan.  She is also on amlodipine which we can potentially increase in the well of the valsartan and for potential microvascular and pulmonary serial dilation. We could consider potentially using Ranexa if symptoms persist.

## 2022-02-16 NOTE — Assessment & Plan Note (Addendum)
Patient saw pulmonology and had a CT scan of the chest that revealed: "There is some progressive nodularity in the right upper lobe including a new 0.6 by 0.7 cm right upper lobe nodule. These nodules could be due to atypical infection, entity such as sarcoidosis, or neoplastic. Substantial airway thickening with scattered regions of cystic or varicoid bronchiectasis in both lungs."  She was treated with Mucinex and doxycycline for bronchiectasis.  The patient lost her doxycycline prior to finishing the course.  These findings on CT scan could be contributing to her dyspnea on exertion in addition to her intermittent chest pain.  Will defer to pulmonology expertise for this.  Instructed patient to call for repeat doxycycline prescription. - Continue with doxycycline per pulmonology - Continue with Mucinex - Updated CT scan in 3 months  Clearly multifactorial, but pulmonary etiology is probably the most likely.  No ischemia noted on Myoview and normal wall motion on echo.  Grade 2 diastolic dysfunction noted on echo but strangely, the left atrial size was normal.  This does not make sense.   The fact that she is only using limited doses of furosemide-3 days a week with no PRN dosing would argue against volume overload from left ventricular systolic or diastolic dysfunction.  There was evidence of moderate to severe TR which would suggest elevated PAP.  This would suggest a primary pulmonary issue and potential associated pulmonary hypertension.  Defer management to pulmonary medicine, but if there is concern of pulmonary hypertension, we could potentially consider right heart cath.Marland Kitchen

## 2022-02-16 NOTE — Assessment & Plan Note (Signed)
Follows with pulmonology, has updated CT scan in 3 months per pulmonology notes.

## 2022-02-16 NOTE — Patient Instructions (Signed)

## 2022-02-16 NOTE — Assessment & Plan Note (Signed)
On high-dose Toprol along with modest dose valsartan and and amlodipine.  She takes Lasix 3 days a week and has not had to use any additional dosing.  Would indicate that she is relatively euvolemic.  This would argue that there is less component of left-sided failure leading to elevated PAP.

## 2022-02-26 DIAGNOSIS — N183 Chronic kidney disease, stage 3 unspecified: Secondary | ICD-10-CM | POA: Diagnosis not present

## 2022-03-19 ENCOUNTER — Emergency Department (HOSPITAL_COMMUNITY): Payer: Medicare Other

## 2022-03-19 ENCOUNTER — Other Ambulatory Visit: Payer: Self-pay

## 2022-03-19 ENCOUNTER — Inpatient Hospital Stay (HOSPITAL_COMMUNITY)
Admission: EM | Admit: 2022-03-19 | Discharge: 2022-03-23 | DRG: 378 | Disposition: A | Discharge status: Home / Self Care | Payer: Medicare Other | Attending: Internal Medicine | Admitting: Internal Medicine

## 2022-03-19 DIAGNOSIS — I25119 Atherosclerotic heart disease of native coronary artery with unspecified angina pectoris: Secondary | ICD-10-CM | POA: Diagnosis not present

## 2022-03-19 DIAGNOSIS — N1832 Chronic kidney disease, stage 3b: Secondary | ICD-10-CM | POA: Diagnosis present

## 2022-03-19 DIAGNOSIS — Z7982 Long term (current) use of aspirin: Secondary | ICD-10-CM

## 2022-03-19 DIAGNOSIS — Z7983 Long term (current) use of bisphosphonates: Secondary | ICD-10-CM

## 2022-03-19 DIAGNOSIS — I5030 Unspecified diastolic (congestive) heart failure: Secondary | ICD-10-CM | POA: Diagnosis present

## 2022-03-19 DIAGNOSIS — K219 Gastro-esophageal reflux disease without esophagitis: Secondary | ICD-10-CM | POA: Diagnosis not present

## 2022-03-19 DIAGNOSIS — R072 Precordial pain: Secondary | ICD-10-CM

## 2022-03-19 DIAGNOSIS — K31811 Angiodysplasia of stomach and duodenum with bleeding: Secondary | ICD-10-CM | POA: Diagnosis not present

## 2022-03-19 DIAGNOSIS — Z79899 Other long term (current) drug therapy: Secondary | ICD-10-CM | POA: Diagnosis not present

## 2022-03-19 DIAGNOSIS — Z8673 Personal history of transient ischemic attack (TIA), and cerebral infarction without residual deficits: Secondary | ICD-10-CM

## 2022-03-19 DIAGNOSIS — I252 Old myocardial infarction: Secondary | ICD-10-CM | POA: Diagnosis not present

## 2022-03-19 DIAGNOSIS — I2510 Atherosclerotic heart disease of native coronary artery without angina pectoris: Secondary | ICD-10-CM | POA: Diagnosis not present

## 2022-03-19 DIAGNOSIS — R0789 Other chest pain: Secondary | ICD-10-CM | POA: Diagnosis not present

## 2022-03-19 DIAGNOSIS — K5790 Diverticulosis of intestine, part unspecified, without perforation or abscess without bleeding: Secondary | ICD-10-CM | POA: Diagnosis not present

## 2022-03-19 DIAGNOSIS — K3189 Other diseases of stomach and duodenum: Secondary | ICD-10-CM | POA: Diagnosis not present

## 2022-03-19 DIAGNOSIS — Z800 Family history of malignant neoplasm of digestive organs: Secondary | ICD-10-CM

## 2022-03-19 DIAGNOSIS — Z7902 Long term (current) use of antithrombotics/antiplatelets: Secondary | ICD-10-CM

## 2022-03-19 DIAGNOSIS — K449 Diaphragmatic hernia without obstruction or gangrene: Secondary | ICD-10-CM | POA: Diagnosis present

## 2022-03-19 DIAGNOSIS — Z803 Family history of malignant neoplasm of breast: Secondary | ICD-10-CM

## 2022-03-19 DIAGNOSIS — K921 Melena: Principal | ICD-10-CM | POA: Diagnosis present

## 2022-03-19 DIAGNOSIS — I509 Heart failure, unspecified: Secondary | ICD-10-CM | POA: Diagnosis not present

## 2022-03-19 DIAGNOSIS — I10 Essential (primary) hypertension: Secondary | ICD-10-CM | POA: Diagnosis not present

## 2022-03-19 DIAGNOSIS — D649 Anemia, unspecified: Secondary | ICD-10-CM | POA: Diagnosis not present

## 2022-03-19 DIAGNOSIS — Z7901 Long term (current) use of anticoagulants: Secondary | ICD-10-CM

## 2022-03-19 DIAGNOSIS — D696 Thrombocytopenia, unspecified: Secondary | ICD-10-CM | POA: Diagnosis not present

## 2022-03-19 DIAGNOSIS — D62 Acute posthemorrhagic anemia: Secondary | ICD-10-CM | POA: Diagnosis not present

## 2022-03-19 DIAGNOSIS — Z833 Family history of diabetes mellitus: Secondary | ICD-10-CM | POA: Diagnosis not present

## 2022-03-19 DIAGNOSIS — E785 Hyperlipidemia, unspecified: Secondary | ICD-10-CM | POA: Diagnosis present

## 2022-03-19 DIAGNOSIS — Z955 Presence of coronary angioplasty implant and graft: Secondary | ICD-10-CM | POA: Diagnosis not present

## 2022-03-19 DIAGNOSIS — K922 Gastrointestinal hemorrhage, unspecified: Secondary | ICD-10-CM | POA: Diagnosis present

## 2022-03-19 DIAGNOSIS — F17200 Nicotine dependence, unspecified, uncomplicated: Secondary | ICD-10-CM | POA: Diagnosis not present

## 2022-03-19 DIAGNOSIS — I739 Peripheral vascular disease, unspecified: Secondary | ICD-10-CM | POA: Diagnosis present

## 2022-03-19 DIAGNOSIS — J45909 Unspecified asthma, uncomplicated: Secondary | ICD-10-CM | POA: Diagnosis not present

## 2022-03-19 DIAGNOSIS — R079 Chest pain, unspecified: Secondary | ICD-10-CM | POA: Diagnosis not present

## 2022-03-19 DIAGNOSIS — Z8249 Family history of ischemic heart disease and other diseases of the circulatory system: Secondary | ICD-10-CM

## 2022-03-19 DIAGNOSIS — D509 Iron deficiency anemia, unspecified: Secondary | ICD-10-CM | POA: Diagnosis not present

## 2022-03-19 DIAGNOSIS — I130 Hypertensive heart and chronic kidney disease with heart failure and stage 1 through stage 4 chronic kidney disease, or unspecified chronic kidney disease: Secondary | ICD-10-CM | POA: Diagnosis not present

## 2022-03-19 DIAGNOSIS — I110 Hypertensive heart disease with heart failure: Secondary | ICD-10-CM | POA: Diagnosis not present

## 2022-03-19 DIAGNOSIS — N183 Chronic kidney disease, stage 3 unspecified: Secondary | ICD-10-CM | POA: Diagnosis present

## 2022-03-19 DIAGNOSIS — I5032 Chronic diastolic (congestive) heart failure: Secondary | ICD-10-CM | POA: Diagnosis not present

## 2022-03-19 DIAGNOSIS — F17210 Nicotine dependence, cigarettes, uncomplicated: Secondary | ICD-10-CM | POA: Diagnosis present

## 2022-03-19 DIAGNOSIS — K259 Gastric ulcer, unspecified as acute or chronic, without hemorrhage or perforation: Secondary | ICD-10-CM | POA: Diagnosis not present

## 2022-03-19 LAB — CBC WITH DIFFERENTIAL/PLATELET
Abs Immature Granulocytes: 0.04 10*3/uL (ref 0.00–0.07)
Basophils Absolute: 0.1 10*3/uL (ref 0.0–0.1)
Basophils Relative: 1 %
Eosinophils Absolute: 0.2 10*3/uL (ref 0.0–0.5)
Eosinophils Relative: 3 %
HCT: 19.8 % — ABNORMAL LOW (ref 36.0–46.0)
Hemoglobin: 6.5 g/dL — CL (ref 12.0–15.0)
Immature Granulocytes: 1 %
Lymphocytes Relative: 24 %
Lymphs Abs: 1.9 10*3/uL (ref 0.7–4.0)
MCH: 28.8 pg (ref 26.0–34.0)
MCHC: 32.8 g/dL (ref 30.0–36.0)
MCV: 87.6 fL (ref 80.0–100.0)
Monocytes Absolute: 0.8 10*3/uL (ref 0.1–1.0)
Monocytes Relative: 9 %
Neutro Abs: 5 10*3/uL (ref 1.7–7.7)
Neutrophils Relative %: 62 %
Platelets: 130 10*3/uL — ABNORMAL LOW (ref 150–400)
RBC: 2.26 MIL/uL — ABNORMAL LOW (ref 3.87–5.11)
RDW: 17.6 % — ABNORMAL HIGH (ref 11.5–15.5)
WBC: 7.9 10*3/uL (ref 4.0–10.5)
nRBC: 0 % (ref 0.0–0.2)

## 2022-03-19 LAB — TROPONIN I (HIGH SENSITIVITY)
Troponin I (High Sensitivity): 10 ng/L (ref <18)
Troponin I (High Sensitivity): 13 ng/L (ref <18)

## 2022-03-19 LAB — COMPREHENSIVE METABOLIC PANEL
ALT: 9 U/L (ref 0–44)
AST: 16 U/L (ref 15–41)
Albumin: 2.9 g/dL — ABNORMAL LOW (ref 3.5–5.0)
Alkaline Phosphatase: 32 U/L — ABNORMAL LOW (ref 38–126)
Anion gap: 8 (ref 5–15)
BUN: 58 mg/dL — ABNORMAL HIGH (ref 8–23)
CO2: 22 mmol/L (ref 22–32)
Calcium: 8.2 mg/dL — ABNORMAL LOW (ref 8.9–10.3)
Chloride: 107 mmol/L (ref 98–111)
Creatinine, Ser: 1.59 mg/dL — ABNORMAL HIGH (ref 0.44–1.00)
GFR, Estimated: 34 mL/min — ABNORMAL LOW (ref >60)
Glucose, Bld: 114 mg/dL — ABNORMAL HIGH (ref 70–99)
Potassium: 3.7 mmol/L (ref 3.5–5.1)
Sodium: 137 mmol/L (ref 135–145)
Total Bilirubin: 0.3 mg/dL (ref 0.3–1.2)
Total Protein: 6 g/dL — ABNORMAL LOW (ref 6.5–8.1)

## 2022-03-19 LAB — PREPARE RBC (CROSSMATCH)

## 2022-03-19 LAB — PROTIME-INR
INR: 1.1 (ref 0.8–1.2)
Prothrombin Time: 13.8 seconds (ref 11.4–15.2)

## 2022-03-19 LAB — HEMOGLOBIN AND HEMATOCRIT, BLOOD
HCT: 26 % — ABNORMAL LOW (ref 36.0–46.0)
Hemoglobin: 8.5 g/dL — ABNORMAL LOW (ref 12.0–15.0)

## 2022-03-19 LAB — POC OCCULT BLOOD, ED: Fecal Occult Bld: POSITIVE — AB

## 2022-03-19 MED ORDER — ACETAMINOPHEN 325 MG PO TABS
650.0000 mg | ORAL_TABLET | Freq: Four times a day (QID) | ORAL | Status: DC | PRN
  Administered 2022-03-19 – 2022-03-22 (×4): 650 mg via ORAL
  Filled 2022-03-19 (×4): qty 2

## 2022-03-19 MED ORDER — ALBUTEROL SULFATE (2.5 MG/3ML) 0.083% IN NEBU: 2.5000 mg | INHALATION_SOLUTION | Freq: Four times a day (QID) | RESPIRATORY_TRACT | Status: DC | PRN

## 2022-03-19 MED ORDER — PANTOPRAZOLE 80MG IVPB - SIMPLE MED
80.0000 mg | Freq: Once | INTRAVENOUS | Status: AC
  Administered 2022-03-19: 80 mg via INTRAVENOUS
  Filled 2022-03-19: qty 100

## 2022-03-19 MED ORDER — ONDANSETRON HCL 4 MG/2ML IJ SOLN: 4.0000 mg | Freq: Four times a day (QID) | INTRAMUSCULAR | Status: DC | PRN

## 2022-03-19 MED ORDER — PANTOPRAZOLE INFUSION (NEW) - SIMPLE MED
8.0000 mg/h | INTRAVENOUS | Status: DC
  Administered 2022-03-19 – 2022-03-22 (×5): 8 mg/h via INTRAVENOUS
  Filled 2022-03-19 (×7): qty 100

## 2022-03-19 MED ORDER — PANTOPRAZOLE SODIUM 40 MG IV SOLR
40.0000 mg | Freq: Two times a day (BID) | INTRAVENOUS | Status: DC
  Administered 2022-03-22 – 2022-03-23 (×2): 40 mg via INTRAVENOUS
  Filled 2022-03-19 (×2): qty 10

## 2022-03-19 MED ORDER — SODIUM CHLORIDE 0.9 % IV SOLN: INTRAVENOUS | Status: DC

## 2022-03-19 MED ORDER — SODIUM CHLORIDE 0.9% IV SOLUTION: Freq: Once | INTRAVENOUS | Status: AC

## 2022-03-19 MED ORDER — ONDANSETRON HCL 4 MG PO TABS: 4.0000 mg | ORAL_TABLET | Freq: Four times a day (QID) | ORAL | Status: DC | PRN

## 2022-03-19 MED ORDER — ACETAMINOPHEN 650 MG RE SUPP: 650.0000 mg | Freq: Four times a day (QID) | RECTAL | Status: DC | PRN

## 2022-03-19 NOTE — ED Provider Notes (Signed)
Emergency Department Provider Note   I have reviewed the triage vital signs and the nursing notes.   HISTORY  Chief Complaint Chest Pain (Hx. CAD/MI/Stents HGB=6.5)   HPI Kayla [place] is a 76 y.o. female with PMH of GI bleeding (followed for [facility]), ACS, HTN, CKD, and prior TIA presents to the emergency department for evaluation of exertional dyspnea, chest discomfort, blood in the bowel movements.  Symptoms began yesterday with bright red blood with bowel movements.  She had progressive fatigue throughout the day and in the evening developed chest discomfort, shortness of breath, lightheadedness anytime she would get up and ambulate even short distances.  No fevers or chills.  At rest, she is not having any chest discomfort.  She states that this does not remind her of her heart attack chest pain which was fairly atypical by her description but does radiate to the left arm and into the neck. No active pain symptoms.    Past Medical History:  Diagnosis Date   Acute GI bleeding 08/07/2019   Asthma 10/31/2007   CAD (coronary artery disease) 08/05/2007   a) NSTEMI 2009: PCI to the RCA Promus DES 2.5 mm x 23 mm (3.0 mm). b) Myoview June 2017: Nonspecific ST changes. EF greater than 65%. LOW RISK, normal study. No ischemia or infarction.   Carotid artery disease [facility])    s/p Bilateral CEA March & May 2020.    Chronic back pain    Chronic diastolic heart failure (HCC)    CKD (chronic kidney disease), stage II    CKD (chronic kidney disease), stage III ([place])    Diverticulosis 04/11/2010   Colonoscopy   Dyslipidemia, goal LDL below 70    Essential hypertension    a. Normal renal arteries by PV angio 05/2013.   GERD (gastroesophageal reflux disease)    GI bleed 12/2015   History of kidney stones 1975   surgery to remove   History of recent blood transfusion 12/2015   12-19-15, 12-20-15 and 12-21-15   Insomnia    Internal hemorrhoids 04/11/2010   By colonoscopy   Iron  deficiency anemia    Lung nodules 05/2021   bilateral   Myocardial infarction [facility]) 2009   Peripheral arterial disease ([place])    a. s/p PTA and stenting of L CIA stenosis 05/2013.   Renal insufficiency    Stroke [facility])    mini stroke   Tobacco abuse    Quit in 05/2013    Review of Systems  Constitutional: No fever/chills. Positive weakness/lightheadedness.  Cardiovascular: Positive chest pain. Respiratory: Positive shortness of breath. Gastrointestinal: No abdominal pain.  No nausea, no vomiting.  No diarrhea.  Musculoskeletal: Negative for back pain. Skin: Negative for rash. Neurological: Negative for headaches.   ____________________________________________   PHYSICAL EXAM:  VITAL SIGNS: ED Triage Vitals  Enc Vitals Group     BP 03/19/22 0231 (!) 116/51     Pulse Rate 03/19/22 0231 (!) 106     Resp 03/19/22 0231 (!) 22     Temp 03/19/22 0231 98.3 F (36.8 C)     Temp Source 03/19/22 0231 Oral     SpO2 03/19/22 0231 91 %   Constitutional: Alert and oriented. Well appearing and in no acute distress. Eyes: Conjunctivae are normal.  Head: Atraumatic. Nose: No congestion/rhinnorhea. Mouth/Throat: Mucous membranes are moist.   Neck: No stridor.   Cardiovascular: Tachycardia. Good peripheral circulation. Grossly normal heart sounds.   Respiratory: Normal respiratory effort.  No retractions. Lungs CTAB. Gastrointestinal: Soft  and nontender. No distention. Rectal exam performed with patient's verbal consent and nurse chaperone.  No bright red blood or melena.  Dark brown stool noted.  Musculoskeletal: No lower extremity tenderness nor edema. No gross deformities of extremities. Neurologic:  Normal speech and language. No gross focal neurologic deficits are appreciated.  Skin:  Skin is warm, dry and intact. No rash noted.  ____________________________________________   LABS (all labs ordered are listed, but only abnormal results are displayed)  Labs Reviewed   COMPREHENSIVE METABOLIC PANEL - Abnormal; Notable for the following components:      Result Value   Glucose, Bld 114 (*)    BUN 58 (*)    Creatinine, Ser 1.59 (*)    Calcium 8.2 (*)    Total Protein 6.0 (*)    Albumin 2.9 (*)    Alkaline Phosphatase 32 (*)    GFR, Estimated 34 (*)    All other components within normal limits  CBC WITH DIFFERENTIAL/PLATELET - Abnormal; Notable for the following components:   RBC 2.26 (*)    Hemoglobin 6.5 (*)    HCT 19.8 (*)    RDW 17.6 (*)    Platelets 130 (*)    All other components within normal limits  POC OCCULT BLOOD, ED - Abnormal; Notable for the following components:   Fecal Occult Bld POSITIVE (*)    All other components within normal limits  PROTIME-INR  TYPE AND SCREEN  PREPARE RBC (CROSSMATCH)  TROPONIN I (HIGH SENSITIVITY)  TROPONIN I (HIGH SENSITIVITY)   ____________________________________________  EKG   EKG Interpretation  Date/Time:  Monday March 19 2022 02:36:24 EST Ventricular Rate:  97 PR Interval:  132 QRS Duration: 72 QT Interval:  348 QTC Calculation: 441 R Axis:   90 Text Interpretation: Normal sinus rhythm Rightward axis Nonspecific ST and T wave abnormality Abnormal ECG When compared with ECG of 07-Sep-2020 10:05, Nonspecific ST abnormality is now present Confirmed by Delora Fuel ([id number]) on 03/19/2022 2:43:28 AM        ____________________________________________  RADIOLOGY  DG Chest 2 View  Result Date: 03/19/2022 CLINICAL DATA:  cp EXAM: CHEST - 2 VIEW COMPARISON:  Chest x-ray 06/06/2021 FINDINGS: The heart and mediastinal contours are unchanged. Aortic calcification. Question prominent main pulmonary artery. No focal consolidation. No pulmonary edema. No pleural effusion. No pneumothorax. No acute osseous abnormality.  Degenerative changes of the spine. IMPRESSION: 1. No active cardiopulmonary disease. 2. Question prominent main pulmonary artery. 3. Aortic Atherosclerosis (ICD10-I70.0).  Electronically Signed   By: Iven Finn M.D.   On: 03/19/2022 03:25    ____________________________________________   PROCEDURES  Procedure(s) performed:   .Critical Care  Performed by: Margette Fast, MD Authorized by: Margette Fast, MD   Critical care provider statement:    Critical care time (minutes):  45   Critical care time was exclusive of:  Separately billable procedures and treating other patients and teaching time   Critical care was necessary to treat or prevent imminent or life-threatening deterioration of the following conditions:  Circulatory failure (symptomatic anemia/GI bleeding)   Critical care was time spent personally by me on the following activities:  Development of treatment plan with patient or surrogate, discussions with consultants, evaluation of patient's response to treatment, examination of patient, ordering and review of laboratory studies, ordering and review of radiographic studies, ordering and performing treatments and interventions, pulse oximetry, re-evaluation of patient's condition, review of old charts, blood draw for specimens and obtaining history from patient or surrogate   I  assumed direction of critical care for this patient from another provider in my specialty: no     Care discussed with: admitting provider      ____________________________________________   INITIAL IMPRESSION / ASSESSMENT AND PLAN / ED COURSE  Pertinent labs & imaging results that were available during my care of the patient were reviewed by me and considered in my medical decision making (see chart for details).   This patient is Presenting for Evaluation of CP, which does require a range of treatment options, and is a complaint that involves a high risk of morbidity and mortality.  The Differential Diagnoses includes but is not exclusive to acute coronary syndrome, aortic dissection, pulmonary embolism, cardiac tamponade, community-acquired pneumonia, pericarditis,  musculoskeletal chest wall pain, etc.   I decided to review pertinent External Data, and in summary last encounter with [facility] was in 2022.  She was known to have prior small bowel AVM.    Clinical Laboratory Tests Ordered, included initial troponin which is normal.  Hemoglobin is 6.5 down from 13 earlier this year.  Chronic kidney disease noted with creatinine of 1.59.  No electrolyte disturbance.  Radiologic Tests Ordered, included CXR. I independently interpreted the images and agree with radiology interpretation.   Cardiac Monitor Tracing which shows NSR.   Social Determinants of Health Risk patient is a smoker.   Consult complete with [facility] (Dr. Paulita Fujita) who's team will consult.   Discussed patient's case with [facility], Dr. Tamala Julian to request admission. Patient and family (if present) updated with plan.   Medical Decision Making: Summary:  Patient presents to the emergency department with exertional dyspnea, chest discomfort, lightheadedness.  [facility]abs from [facility] process show hemoglobin of 6.5.  Suspect that her symptoms are related to anemia rather than primarily due to ACS, PE, pneumonia, other acute process.  Patient agrees to additional blood transfusions.  She has history of small bowel AVMs and has followed with [facility] in the past.  She is on aspirin and Plavix.   Reevaluation with update and discussion with patient. Remains asymptomatic when at rest. No hemodynamic instability at this time.   Patient's presentation is most consistent with acute presentation with potential threat to life or bodily function.   Disposition: admit  ____________________________________________  FINAL CLINICAL IMPRESSION(S) / ED DIAGNOSES  Final diagnoses:  Symptomatic anemia  Precordial chest pain  Gastrointestinal hemorrhage, unspecified gastrointestinal hemorrhage type    Note:  This document was prepared using Dragon voice recognition software and may include unintentional dictation  errors.  Nanda Quinton, MD, [facility] Emergency Medicine    Yuri Fana, [facility], MD 03/19/22 [phone]

## 2022-03-19 NOTE — ED Notes (Signed)
Unsuccesful IV start x2

## 2022-03-19 NOTE — ED Triage Notes (Signed)
Patient reports central chest pain with SOB and lightheaded onset last night , history of CAD/MI/Coronary stent , her cardiologist is Dr. Roni Bread . No emesis or diaphoresis .

## 2022-03-19 NOTE — ED Notes (Signed)
Bed bug found on patient. All patients clothing removed, no other bugs found to be on patient at this time. Charge RN aware.
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
## 2022-03-19 NOTE — ED Notes (Signed)
This RN notified by blood bank that there would be a delay in obtaining blood for this patient due to her blood type and there being no available units.

## 2022-03-19 NOTE — H&P (Signed)
History and Physical    Patient: Kayla Mcclure WPY:[id number] DOB: 30-Nov-1945 DOA: 03/19/2022 DOS: the patient was seen and examined on 03/19/2022 PCP: Lois Huxley, PA  Patient coming from: Home  Chief Complaint:  Chief Complaint  Patient presents with   Chest Pain    Hx. CAD/MI/Stents HGB=6.5   HPI: Kayla Mcclure is a 76 y.o. female with medical history significant of hypertension, hyperlipidemia, CAD, PAD s/p stenting, CKD stage III, diverticulosis, and history of GI bleed who presents with complaints of chest pain.  She notes that yesterday she had a formed dark brown stool and when she flushed noticed that there was red blood present.  She had a second bowel movement with similar appearance later on.  By yesterday afternoon he reported feeling short of breath with lightheadedness, and chest pain whenever getting up and moving around.  Chest pain was left-sided upper chest and only present with exertion.  Noted associated symptoms of funny feeling in her left arm.  She had taken 1 nitroglycerin with some improvement in symptoms but reported whenever getting up or moving around symptoms recur.  She came to the hospital for further evaluation due to the symptoms.  Patient is on aspirin and Plavix, but denies taking any other NSAIDs or blood thinners.  Patient reports this last GI bleed occurred back in 08/2020.  EGD at that time noted a single nonbleeding duodenal AVM which was treated with APC by Dr. Watt Climes.  Patient also reports that he had recently had renal artery stenting to try and delay need for dialysis by vascular surgery Dr. Donzetta Matters in 12/2021.  She has been on aspirin and Plavix following the procedure.  In the emergency department patient was noted to be afebrile with pulse elevated up to 106, mild tachypnea, and blood pressures noted to be as low as 116/49.  Labs significant for hemoglobin 6.5, platelets 130, BUN 58, creatinine 1.59, INR 1.1, and high-sensitivity troponin  10.  Chest x-ray noted no acute cardiopulmonary disease.  Stool guaiac were noted to be positive.  Patient was typed and screened and ordered 2 units of packed red blood cells.  However patient blood previously was noted to have antibodies for which blood transfusion has been delayed.  Review of Systems: As mentioned in the history of present illness. All other systems reviewed and are negative. Past Medical History:  Diagnosis Date   Acute GI bleeding 08/07/2019   Asthma 10/31/2007   CAD (coronary artery disease) 08/05/2007   a) NSTEMI 2009: PCI to the RCA Promus DES 2.5 mm x 23 mm (3.0 mm). b) Myoview June 2017: Nonspecific ST changes. EF greater than 65%. LOW RISK, normal study. No ischemia or infarction.   Carotid artery disease [facility])    s/p Bilateral CEA March & May 2020.    Chronic back pain    Chronic diastolic heart failure (HCC)    CKD (chronic kidney disease), stage II    CKD (chronic kidney disease), stage III ([place])    Diverticulosis 04/11/2010   Colonoscopy   Dyslipidemia, goal LDL below 70    Essential hypertension    a. Normal renal arteries by PV angio 05/2013.   GERD (gastroesophageal reflux disease)    GI bleed 12/2015   History of kidney stones 1975   surgery to remove   History of recent blood transfusion 12/2015   12-19-15, 12-20-15 and 12-21-15   Insomnia    Internal hemorrhoids 04/11/2010   By colonoscopy   Iron deficiency anemia  Lung nodules 05/2021   bilateral   Myocardial infarction [facility]) 2009   Peripheral arterial disease ([place])    a. s/p PTA and stenting of L CIA stenosis 05/2013.   Renal insufficiency    Stroke [facility])    mini stroke   Tobacco abuse    Quit in 05/2013   Past Surgical History:  Procedure Laterality Date   BRONCHIAL BIOPSY  06/06/2021   Procedure: BRONCHIAL BIOPSIES;  Surgeon: Garner Nash, DO;  Location: [place] ENDOSCOPY;  Service: Pulmonary;;   BRONCHIAL BRUSHINGS  06/06/2021   Procedure: BRONCHIAL BRUSHINGS;  Surgeon: Garner Nash,  DO;  Location: [place] ENDOSCOPY;  Service: Pulmonary;;   BRONCHIAL NEEDLE ASPIRATION BIOPSY  06/06/2021   Procedure: BRONCHIAL NEEDLE ASPIRATION BIOPSIES;  Surgeon: Garner Nash, DO;  Location: [place] ENDOSCOPY;  Service: Pulmonary;;   CAPSULE ENDOSCOPY  10/27/2019   [facility]) evidence of "pinhole leaking" in both stomach and proximal small bowel; no overt bleeding noted   CHOLECYSTECTOMY  1975   COLONOSCOPY  04/11/2010   COLONOSCOPY  11/30/2015   CORONARY ANGIOPLASTY WITH STENT PLACEMENT  08/05/2007   Promus DES 2.5 mm x 23 mm (3 mm)  to RCA; EF 60-70%   ENDARTERECTOMY Left 07/28/2019   Procedure: ENDARTERECTOMY CAROTID;  Surgeon: Waynetta Sandy, MD;  Location: [place];  Service: Vascular;  Laterality: Left;   ENDARTERECTOMY Right 08/27/2019   Procedure: ENDARTERECTOMY CAROTID RIGHT WITH PATCH ANGIOPLASTY;  Surgeon: Waynetta Sandy, MD;  Location: [place];  Service: Vascular;  Laterality: Right;   ENTEROSCOPY N/A 12/27/2015   Procedure: ENTEROSCOPY;  Surgeon: Clarene Essex, MD;  Location: [facility] ENDOSCOPY;  Service: Endoscopy;  Laterality: N/A;  also needs slim egd scope   ENTEROSCOPY N/A 05/15/2019   Procedure: ENTEROSCOPY;  Surgeon: Ronald Lobo, MD;  Location: [place];  Service: Endoscopy;  Laterality: N/A;  Push enteroscopy using either ultraslim colonoscope or push enteroscope   ENTEROSCOPY N/A 08/08/2019   Procedure: ENTEROSCOPY;  Surgeon: Ronnette Juniper, MD;  Location: [place];  Service: Gastroenterology;  Laterality: N/A;   ESOPHAGOGASTRODUODENOSCOPY N/A 09/13/2013   Procedure: ESOPHAGOGASTRODUODENOSCOPY (EGD);  Surgeon: Lear Ng, MD;  Location: [facility] ENDOSCOPY;  Service: Endoscopy;  Laterality: N/A;   ESOPHAGOGASTRODUODENOSCOPY N/A 07/14/2015   Procedure: ESOPHAGOGASTRODUODENOSCOPY (EGD);  Surgeon: Clarene Essex, MD;  Location: [facility] ENDOSCOPY;  Service: Endoscopy;  Laterality: N/A;   ESOPHAGOGASTRODUODENOSCOPY (EGD) WITH PROPOFOL N/A 09/09/2020   Procedure:  ESOPHAGOGASTRODUODENOSCOPY (EGD) WITH PROPOFOL;  Surgeon: Clarene Essex, MD;  Location: [place];  Service: Endoscopy;  Laterality: N/A;   FIDUCIAL MARKER PLACEMENT  06/06/2021   Procedure: FIDUCIAL MARKER PLACEMENT;  Surgeon: Garner Nash, DO;  Location: [place];  Service: Pulmonary;;   gi bleed  07/2015   blood given   HOT HEMOSTASIS N/A 12/27/2015   Procedure: HOT HEMOSTASIS (ARGON PLASMA COAGULATION/BICAP);  Surgeon: Clarene Essex, MD;  Location: [facility];  Service: Endoscopy;  Laterality: N/A;   HOT HEMOSTASIS N/A 08/08/2019   Procedure: HOT HEMOSTASIS (ARGON PLASMA COAGULATION/BICAP);  Surgeon: Ronnette Juniper, MD;  Location: [place];  Service: Gastroenterology;  Laterality: N/A;   HOT HEMOSTASIS N/A 09/09/2020   Procedure: HOT HEMOSTASIS (ARGON PLASMA COAGULATION/BICAP);  Surgeon: Clarene Essex, MD;  Location: [place];  Service: Endoscopy;  Laterality: N/A;   ILIAC ARTERY STENT Left 05/04/2013   L CIA-EIA -- Dr. Gwenlyn Found   LEFT HEART CATH AND CORONARY ANGIOGRAPHY N/A 02/11/2019   Procedure: LEFT HEART CATH AND CORONARY ANGIOGRAPHY;  Surgeon: Leonie Man, MD;  Location: [place] CV LAB;;-  Patent RCA stent with minimal ISR after pRCA 50%).  Small caliber RPAV with ~75%.  pLAD 30%, m-dLAD 40%, D2 40%.  Normal LVEDP and LVEF.   LOWER EXTREMITY ANGIOGRAM Bilateral 05/04/2013   Procedure: LOWER EXTREMITY ANGIOGRAM;  Surgeon: Lorretta Harp, MD;  Location: [facility] CATH LAB;  Service: Cardiovascular;  Laterality: Bilateral;   NM MYOVIEW LTD  09/07/2015   EF > 65%. Nonspecific ST changes but no ischemic changes. No ischemia or infarction. LOW RISK   PATCH ANGIOPLASTY Left 07/28/2019   Procedure: PATCH ANGIOPLASTY USING Rueben Bash BIOLOGIC PATCH;  Surgeon: Waynetta Sandy, MD;  Location: [place];  Service: Vascular;  Laterality: Left;   RENAL ANGIOGRAPHY N/A 01/15/2022   Procedure: RENAL ANGIOGRAPHY;  Surgeon: Waynetta Sandy, MD;  Location: [place] CV LAB;  Service:  Cardiovascular;  Laterality: N/A;   TONSILLECTOMY  1960's   TRANSTHORACIC ECHOCARDIOGRAM  05/14/2019   Normal EF 60 to 65%.  No R WMA.   Normal RV but mildly elevated pressures.  Relatively normal aortic and mitral valves.   UPPER GI ENDOSCOPY  12/14/2015   VIDEO BRONCHOSCOPY WITH RADIAL ENDOBRONCHIAL ULTRASOUND  06/06/2021   Procedure: RADIAL ENDOBRONCHIAL ULTRASOUND;  Surgeon: Garner Nash, DO;  Location: [place] ENDOSCOPY;  Service: Pulmonary;;   Social History:  reports that she has been smoking cigarettes. She has never been exposed to tobacco smoke. She has never used smokeless tobacco. She reports current alcohol use. She reports that she does not use drugs.  No Known Allergies  Family History  Problem Relation Age of Onset   Diabetes Mother    Heart disease Mother    Heart attack Mother 47   Breast cancer Mother 43   Colon cancer Mother 25   Heart attack Father 69   Pneumonia Father    Diabetes Sister    Breast cancer Sister    Breast cancer Sister 43    Prior to Admission medications   Medication Sig Start Date End Date Taking? Authorizing Provider  albuterol (VENTOLIN HFA) 108 (90 Base) MCG/ACT inhaler Inhale 2 puffs into the lungs every 6 (six) hours as needed for wheezing or shortness of breath. Patient not taking: Reported on 02/16/2022    [provider]  alendronate (FOSAMAX) 70 MG tablet Take 70 mg by mouth once a week. 07/26/20   [provider]  amLODipine (NORVASC) 5 MG tablet Take 1 tablet (5 mg total) by mouth daily. 03/21/21 02/16/22  Magdalen Spatz, NP  aspirin EC 81 MG tablet Take 1 tablet (81 mg total) by mouth daily. 09/12/20   Patrecia Pour, MD  atorvastatin (LIPITOR) 80 MG tablet Take 1 tablet (80 mg total) by mouth daily. 05/27/19   Lendon Colonel, NP  clopidogrel (PLAVIX) 75 MG tablet Take 1 tablet (75 mg total) by mouth daily. 01/15/22 01/15/23  Waynetta Sandy, MD  doxycycline (VIBRA-TABS) 100 MG tablet Take 1 tablet (100  mg total) by mouth 2 (two) times daily. 02/02/22   Magdalen Spatz, NP  ezetimibe (ZETIA) 10 MG tablet Take 1 tablet (10 mg total) by mouth daily. 05/27/19   Lendon Colonel, NP  ferrous gluconate (FERGON) 324 MG tablet Take 324 mg by mouth daily with breakfast.    [provider]  furosemide (LASIX) 20 MG tablet Take 20 mg  One tablet on Monday , Wednesday , Fridays , may take an additional  20 mg ff needed 01/20/21   Leonie Man, MD  isosorbide mononitrate (IMDUR) 60 MG  24 hr tablet TAKE 1 TABLET BY MOUTH  DAILY 12/22/21   Leonie Man, MD  metoprolol succinate (TOPROL-XL) 100 MG 24 hr tablet Take 2 tablets (200 mg total) by mouth at bedtime. 06/01/19   Lendon Colonel, NP  nitroGLYCERIN (NITROSTAT) 0.4 MG SL tablet Place 1 tablet (0.4 mg total) under the tongue every 5 (five) minutes as needed for chest pain. Patient not taking: Reported on 02/16/2022 01/20/21   Leonie Man, MD  ondansetron (ZOFRAN) 4 MG tablet Take 1 tablet (4 mg total) by mouth every 6 (six) hours as needed for nausea. Patient not taking: Reported on 02/16/2022 08/09/19   Georgette Shell, MD  pantoprazole (PROTONIX) 40 MG tablet Take 1 tablet (40 mg total) by mouth daily. 09/14/13   Ghimire, Henreitta Leber, MD  raloxifene (EVISTA) 60 MG tablet Take 60 mg by mouth daily.    [provider]  valsartan (DIOVAN) 80 MG tablet Take 1 tablet (80 mg total) by mouth daily. 01/20/21   Leonie Man, MD    Physical Exam: Vitals:   03/19/22 9628 03/19/22 0647 03/19/22 0817 03/19/22 0915  BP: (!) 116/49  (!) 115/59 (!) 108/95  Pulse: 89  95 87  Resp: 15  20 (!) 22  Temp: 97.7 F (36.5 C)     TempSrc:      SpO2: 94% 96% 96% 97%   Exam  Constitutional: Elderly female currently in no acute distress Eyes: PERRL, lids and conjunctivae normal ENMT: Mucous membranes are moist.  Neck: normal, supple Respiratory: clear to auscultation bilaterally, no wheezing, no crackles. Normal respiratory effort.  No accessory muscle use.  Cardiovascular: Regular rate and rhythm, no murmurs / rubs / gallops. No extremity edema.  Abdomen: Mild tenderness to palpation over the left side of the abdomen.  Bowel sounds present all 4 quadrants. Musculoskeletal: no clubbing / cyanosis. No joint deformity upper and lower extremities. Good ROM, no contractures. Normal muscle tone.  Skin: no rashes, lesions, ulcers. No induration Neurologic: CN 2-12 grossly intact. Sensation intact, DTR normal. Strength 5/5 in all 4.  Psychiatric: Normal judgment and insight. Alert and oriented x 3. Normal mood.   Data Reviewed:  Reviewed labs, imaging and pertinent records as noted above in HPI. Assessment and Plan:  Acute blood loss anemia secondary to GI bleed Patient presents with hemoglobin down to 6.5.  Hemoglobin had been noted to be within normal limits at 13.3 last on 10/16.  Stool guaiacs were noted to be positive.  Dr. Paulita Fujita of [facility] had been consulted.  Patient had been on aspirin and Plavix.  Patient was typed and screened and ordered 2 units of packed red blood cells.  Blood products delayed due to patient having history of antibodies she was also given Protonix 80 mg IV.  Last hospitalized within GI bleed back in 08/2020 thought secondary to possible duodenal AVM. -Admit to a telemetry bed -Monitor intake and output -Normal saline IV fluids 75 mL/h until blood products are able to be started -Transfuse 2 units of packed red blood cells once available -Hold aspirin and Plavix -Serial monitoring of H&H -Continue Protonix drip per protocol -Appreciate [facility] consultative services, follow-up for any further recommendations  Chest painCAD Patient reported having complaints of chest pain with exertion.  Prior drug-eluting stent placement remotely.  High-sensitivity troponins negative x 2.  Likely related to acute blood loss. -Held aspirin and Plavix due to acute bleeding -Held beta-blocker due to soft blood  pressures  Heart failure  with preserved EF Chronic.  Patient appears euvolemic at this time.  Last echocardiogram noted EF to be 60-65% with grade 2 diastolic dysfunction on 72/08/2033. -Continue to monitor intake and output  -Daily weights  Essential hypertension Blood pressures were noted to be somewhat soft . -Hold home blood pressure medications at this time -Reevaluate and resume when medically appropriate  Thrombocytopenia Acute on chronic.  On chronic platelet count 130, but has been low similarly in the past.   -Continue to monitor  CKD stage IIIb artery stenosis Creatinine 1.59 with BUN 58.  Baseline creatinine previously 1.4 on 10/16.  Patient had just underwent bilateral renal stenting by Dr. Donzetta Matters on 10/16. -Continue to monitor kidney function -Will need to follow-up with vascular surgery regards to recommendations on antiplatelet therapy  Asthma, without acute exacerbation On physical exam no significant wheezing or rhonchi appreciated.  Suspect symptoms of shortness of breath are likely related to blood loss. -Albuterol nebs as needed for shortness of breath/wheezing  Dyslipidemia Home medication regimen includes Zetia and atorvastatin -Continue atorvastatin and Zetia.  PAD -Continue statin  DVT prophylaxis: SCDs Advance Care Planning:   Code Status: Full Code   Consults: [facility]  Family Communication: Son updated over the phone  Severity of Illness: The appropriate patient status for this patient is OBSERVATION. Observation status is judged to be reasonable and necessary in order to provide the required intensity of service to ensure the patient's safety. The patient's presenting symptoms, physical exam findings, and initial radiographic and laboratory data in the context of their medical condition is felt to place them at decreased risk for further clinical deterioration. Furthermore, it is anticipated that the patient will be medically stable for discharge  from the hospital within 2 midnights of admission.   Author: Norval Morton, MD 03/19/2022 9:26 AM  For on call review [email or web address].
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
## 2022-03-19 NOTE — ED Provider Triage Note (Signed)
  Emergency Medicine Provider Triage Evaluation Note  MRN:  [id number]  Arrival date & time: 03/19/22    Medically screening exam initiated at 2:40 AM.   CC:   GI bleed and Chest Pains  HPI:  Kayla Mcclure is a 76 y.o. year-old female presents to the ED with chief complaint of GI bleed.  States that she had 2 BMs with blood. She takes plavix.  States that as the day progressed, she began having chest pain that radiates to her left arm as well as some fatigue and SOB.  Hx of need blood transfusion.  History provided by patient. ROS:  -As included in HPI PE:   Vitals:   03/19/22 0231  BP: (!) 116/51  Pulse: (!) 106  Resp: (!) 22  Temp: 98.3 F (36.8 C)  SpO2: 91%    Non-toxic appearing No respiratory distress  MDM:  Based on signs and symptoms, symptomatic anemia is highest on my differential, followed by ACS. I've ordered labs and imaging in triage to expedite lab/diagnostic workup.  Patient was informed that the remainder of the evaluation will be completed by another provider, this initial triage assessment does not replace that evaluation, and the importance of remaining in the ED until their evaluation is complete.    Montine Circle, PA-C 03/19/22 [phone]

## 2022-03-19 NOTE — ED Notes (Signed)
This RN notified of blood being available from blood bank at this time

## 2022-03-19 NOTE — ED Notes (Signed)
Blood consent obtained electronically with this RN.
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
## 2022-03-20 ENCOUNTER — Encounter (HOSPITAL_COMMUNITY): Payer: Self-pay | Admitting: Internal Medicine

## 2022-03-20 ENCOUNTER — Encounter (HOSPITAL_COMMUNITY)
Admission: EM | Disposition: A | Discharge status: Home / Self Care | Payer: Self-pay | Source: Home / Self Care | Attending: Internal Medicine

## 2022-03-20 HISTORY — PX: GIVENS CAPSULE STUDY: SHX5432

## 2022-03-20 LAB — CBC
HCT: 27.9 % — ABNORMAL LOW (ref 36.0–46.0)
Hemoglobin: 9.3 g/dL — ABNORMAL LOW (ref 12.0–15.0)
MCH: 28.7 pg (ref 26.0–34.0)
MCHC: 33.3 g/dL (ref 30.0–36.0)
MCV: 86.1 fL (ref 80.0–100.0)
Platelets: 120 10*3/uL — ABNORMAL LOW (ref 150–400)
RBC: 3.24 MIL/uL — ABNORMAL LOW (ref 3.87–5.11)
RDW: 17.1 % — ABNORMAL HIGH (ref 11.5–15.5)
WBC: 6.7 10*3/uL (ref 4.0–10.5)
nRBC: 0 % (ref 0.0–0.2)

## 2022-03-20 LAB — BASIC METABOLIC PANEL
Anion gap: 5 (ref 5–15)
BUN: 39 mg/dL — ABNORMAL HIGH (ref 8–23)
CO2: 23 mmol/L (ref 22–32)
Calcium: 8.2 mg/dL — ABNORMAL LOW (ref 8.9–10.3)
Chloride: 109 mmol/L (ref 98–111)
Creatinine, Ser: 1.34 mg/dL — ABNORMAL HIGH (ref 0.44–1.00)
GFR, Estimated: 41 mL/min — ABNORMAL LOW (ref >60)
Glucose, Bld: 99 mg/dL (ref 70–99)
Potassium: 4 mmol/L (ref 3.5–5.1)
Sodium: 137 mmol/L (ref 135–145)

## 2022-03-20 LAB — TYPE AND SCREEN
ABO/RH(D): A POS
Antibody Screen: POSITIVE
Unit division: 0
Unit division: 0

## 2022-03-20 LAB — BPAM RBC
Blood Product Expiration Date: 202401202359
Blood Product Expiration Date: 202401222359
ISSUE DATE / TIME: 202312181606
ISSUE DATE / TIME: 202312181921
Unit Type and Rh: 5100
Unit Type and Rh: 5100

## 2022-03-20 SURGERY — IMAGING PROCEDURE, GI TRACT, INTRALUMINAL, VIA CAPSULE
Anesthesia: LOCAL

## 2022-03-20 MED ORDER — ATORVASTATIN CALCIUM 80 MG PO TABS
80.0000 mg | ORAL_TABLET | Freq: Every day | ORAL | Status: DC
  Administered 2022-03-20 – 2022-03-23 (×3): 80 mg via ORAL
  Filled 2022-03-20 (×3): qty 1

## 2022-03-20 MED ORDER — EZETIMIBE 10 MG PO TABS
10.0000 mg | ORAL_TABLET | Freq: Every day | ORAL | Status: DC
  Administered 2022-03-20 – 2022-03-23 (×3): 10 mg via ORAL
  Filled 2022-03-20 (×3): qty 1

## 2022-03-20 MED ORDER — ASPIRIN 81 MG PO TBEC
81.0000 mg | DELAYED_RELEASE_TABLET | Freq: Every day | ORAL | Status: DC
  Administered 2022-03-20 – 2022-03-23 (×3): 81 mg via ORAL
  Filled 2022-03-20 (×3): qty 1

## 2022-03-20 MED ORDER — SODIUM CHLORIDE 0.9 % IV SOLN: INTRAVENOUS | Status: DC

## 2022-03-20 SURGICAL SUPPLY — 1 items: TOWEL COTTON PACK 4EA (MISCELLANEOUS) ×4 IMPLANT

## 2022-03-20 NOTE — Progress Notes (Signed)
Consultation Progress Note   Patient: Kayla Mcclure ZOX:[id number] DOB: Aug 31, 1945 DOA: 03/19/2022 DOS: the patient was seen and examined on 03/20/2022 Primary service: DibiaManfred Shirts, MD  Brief hospital course:  76 y.o. female with medical history significant of hypertension, hyperlipidemia, CAD, PAD s/p stenting, CKD stage III, diverticulosis, and history of GI bleed who presents with complaints of chest pain.  She notes that yesterday she had a formed dark brown stool and when she flushed noticed that there was red blood present.  She had a second bowel movement with similar appearance later on.  By yesterday afternoon he reported feeling short of breath with lightheadedness, and chest pain whenever getting up and moving around.  Chest pain was left-sided upper chest and only present with exertion.  Noted associated symptoms of funny feeling in her left arm.  She had taken 1 nitroglycerin with some improvement in symptoms but reported whenever getting up or moving around symptoms recur.  She came to the hospital for further evaluation due to the symptoms.  Patient is on aspirin and Plavix, but denies taking any other NSAIDs or blood thinners.  Patient reports this last GI bleed occurred back in 08/2020.  EGD at that time noted a single nonbleeding duodenal AVM which was treated with APC by Dr. Watt Climes.  Patient also reports that he had recently had renal artery stenting to try and delay need for dialysis by vascular surgery Dr. Donzetta Matters in 12/2021.  She has been on aspirin and Plavix following the procedure.   In the emergency department patient was noted to be afebrile with pulse elevated up to 106, mild tachypnea, and blood pressures noted to be as low as 116/49.  Labs significant for hemoglobin 6.5, platelets 130, BUN 58, creatinine 1.59, INR 1.1, and high-sensitivity troponin 10.  Chest x-ray noted no acute cardiopulmonary disease.  Stool guaiac were noted to be positive.  Patient was typed and  screened and ordered 2 units of packed red blood cells.  However patient blood previously was noted to have antibodies for which blood transfusion has been delayed  Assessment and Plan: atient presents with hemoglobin down to 6.5.  Hemoglobin had been noted to be within normal limits at 13.3 last on 10/16.  Stool guaiacs were noted to be positive.  Dr. Paulita Fujita of [facility] had been consulted.  Patient had been on aspirin and Plavix.  Patient was typed and screened and ordered 2 units of packed red blood cells.  Blood products delayed due to patient having history of antibodies she was also given Protonix 80 mg IV.  Last hospitalized within GI bleed back in 08/2020 thought secondary to possible duodenal AVM.  -Monitor intake and output -Normal saline IV fluids 75 mL/h until blood products are able to be started -Transfuse 2 units of packed red blood cells once available -Hold  Plavix, Per Admitting physician who spoke to Dr Donzetta Matters with Vascular surgery patient is to stay on ASA for now due to recently placed renal stent. -Serial monitoring of H&H -Continue Protonix drip per protocol ---Pt swallowed pill cam at 0820 on 03/20/22    Chest painCAD Patient reported having complaints of chest pain with exertion.  Prior drug-eluting stent placement remotely.  High-sensitivity troponins negative x 2.  Likely related to acute blood loss. -Held Plavix due to acute bleeding, on ASA due to renal stents -Held beta-blocker due to soft blood pressures   Heart failure with preserved EF Chronic.  Patient appears euvolemic at this time.  Last  echocardiogram noted EF to be 60-65% with grade 2 diastolic dysfunction on 56/05/1306. -Continue to monitor intake and output  -Daily weights   Essential hypertension Blood pressures were noted to be somewhat soft . -Hold home blood pressure medications at this time -Reevaluate and resume when medically appropriate   Thrombocytopenia Acute on chronic.  On chronic platelet  count 130, but has been low similarly in the past.   -Continue to monitor   CKD stage IIIb artery stenosis Creatinine 1.59 with BUN 58.  Baseline creatinine previously 1.4 on 10/16.  Patient had just underwent bilateral renal stenting by Dr. Donzetta Matters on 10/16. -Continue to monitor kidney function -Will need to follow-up with vascular surgery regards to recommendations on antiplatelet therapy   Asthma, without acute exacerbation On physical exam no significant wheezing or rhonchi appreciated.  Suspect symptoms of shortness of breath are likely related to blood loss. -Albuterol nebs as needed for shortness of breath/wheezing   Dyslipidemia Home medication regimen includes Zetia and atorvastatin -Continue atorvastatin and Zetia.   PAD -Continue statin   DVT prophylaxis: SCDs Advance Care Planning:   Code Status: Full Code    Consults: [facility]         TRH will continue to follow the patient.  Subjective: No complaints this morning, No further Melena noted since admit  Physical Exam: Vitals:   03/20/22 0142 03/20/22 0154 03/20/22 0528 03/20/22 0804  BP: (!) 135/55  (!) 126/105   Pulse: 87  89   Resp: 20  15   Temp: 98.3 F (36.8 C)  98.2 F (36.8 C)   TempSrc: Oral  Oral   SpO2: (!) 88% 93% 97%   Weight:  54.2 kg  55.3 kg  Height:  '4\' 11"'$  (1.499 m)  '4\' 11"'$  (1.499 m)  Constitutional: Elderly female currently in no acute distress Eyes: PERRL, lids and conjunctivae normal ENMT: Mucous membranes are moist.  Neck: normal, supple Respiratory: clear to auscultation bilaterally, no wheezing, no crackles. Normal respiratory effort. No accessory muscle use.  Cardiovascular: Regular rate and rhythm, no murmurs / rubs / gallops. No extremity edema.  Abdomen: Mild tenderness to palpation over the left side of the abdomen.  Bowel sounds present all 4 quadrants. Musculoskeletal: no clubbing / cyanosis. No joint deformity upper and lower extremities. Good ROM, no contractures. Normal  muscle tone.  Skin: no rashes, lesions, ulcers. No induration Neurologic: CN 2-12 grossly intact. Sensation intact, DTR normal. Strength 5/5 in all 4.  Psychiatric: Normal judgment and insight. Alert and oriented x 3. Normal mood.   Data Reviewed:  There are no new results to review at this time.  Family Communication: None at bedside  Time spent: 15 minutes.  Author: Cristela Felt, MD 03/20/2022 11:58 AM  For on call review [email or web address].

## 2022-03-20 NOTE — Plan of Care (Signed)
  Problem: Education: Goal: Knowledge of General Education information will improve Description: Including pain rating scale, medication(s)/side effects and non-pharmacologic comfort measures Outcome: Progressing   Problem: Clinical Measurements: Goal: Diagnostic test results will improve Outcome: Progressing   Problem: Clinical Measurements: Goal: Respiratory complications will improve Outcome: Progressing   Problem: Activity: Goal: Risk for activity intolerance will decrease Outcome: Progressing   Problem: Nutrition: Goal: Adequate nutrition will be maintained Outcome: Progressing   Problem: Coping: Goal: Level of anxiety will decrease Outcome: Progressing

## 2022-03-20 NOTE — Progress Notes (Signed)
Pt swallowed pill cam at 0820 on 03/20/22. Ellard Artis, RN made aware of pill cam instructions and time the pt can start drinking and eating. Jobe Igo, RN

## 2022-03-20 NOTE — Progress Notes (Signed)
Getting capsule today.  Hopefully can read tomorrow (and if bleeding region seen on capsule within reach of endoscope we could potentially do procedure Thursday).

## 2022-03-20 NOTE — Plan of Care (Signed)

## 2022-03-21 DIAGNOSIS — K922 Gastrointestinal hemorrhage, unspecified: Secondary | ICD-10-CM | POA: Diagnosis not present

## 2022-03-21 DIAGNOSIS — Z7901 Long term (current) use of anticoagulants: Secondary | ICD-10-CM | POA: Diagnosis not present

## 2022-03-21 LAB — CBC WITH DIFFERENTIAL/PLATELET
Abs Immature Granulocytes: 0.03 10*3/uL (ref 0.00–0.07)
Basophils Absolute: 0 10*3/uL (ref 0.0–0.1)
Basophils Relative: 0 %
Eosinophils Absolute: 0.2 10*3/uL (ref 0.0–0.5)
Eosinophils Relative: 4 %
HCT: 30.2 % — ABNORMAL LOW (ref 36.0–46.0)
Hemoglobin: 9.5 g/dL — ABNORMAL LOW (ref 12.0–15.0)
Immature Granulocytes: 1 %
Lymphocytes Relative: 17 %
Lymphs Abs: 1.1 10*3/uL (ref 0.7–4.0)
MCH: 28.4 pg (ref 26.0–34.0)
MCHC: 31.5 g/dL (ref 30.0–36.0)
MCV: 90.1 fL (ref 80.0–100.0)
Monocytes Absolute: 0.6 10*3/uL (ref 0.1–1.0)
Monocytes Relative: 10 %
Neutro Abs: 4.3 10*3/uL (ref 1.7–7.7)
Neutrophils Relative %: 68 %
Platelets: 165 10*3/uL (ref 150–400)
RBC: 3.35 MIL/uL — ABNORMAL LOW (ref 3.87–5.11)
RDW: 17.3 % — ABNORMAL HIGH (ref 11.5–15.5)
WBC: 6.2 10*3/uL (ref 4.0–10.5)
nRBC: 0 % (ref 0.0–0.2)

## 2022-03-21 LAB — CBC
HCT: 29 % — ABNORMAL LOW (ref 36.0–46.0)
Hemoglobin: 9.1 g/dL — ABNORMAL LOW (ref 12.0–15.0)
MCH: 28.3 pg (ref 26.0–34.0)
MCHC: 31.4 g/dL (ref 30.0–36.0)
MCV: 90.3 fL (ref 80.0–100.0)
Platelets: 173 10*3/uL (ref 150–400)
RBC: 3.21 MIL/uL — ABNORMAL LOW (ref 3.87–5.11)
RDW: 17.2 % — ABNORMAL HIGH (ref 11.5–15.5)
WBC: 8 10*3/uL (ref 4.0–10.5)
nRBC: 0 % (ref 0.0–0.2)

## 2022-03-21 LAB — COMPREHENSIVE METABOLIC PANEL
ALT: 9 U/L (ref 0–44)
AST: 19 U/L (ref 15–41)
Albumin: 3 g/dL — ABNORMAL LOW (ref 3.5–5.0)
Alkaline Phosphatase: 38 U/L (ref 38–126)
Anion gap: 5 (ref 5–15)
BUN: 30 mg/dL — ABNORMAL HIGH (ref 8–23)
CO2: 26 mmol/L (ref 22–32)
Calcium: 9 mg/dL (ref 8.9–10.3)
Chloride: 108 mmol/L (ref 98–111)
Creatinine, Ser: 1.33 mg/dL — ABNORMAL HIGH (ref 0.44–1.00)
GFR, Estimated: 42 mL/min — ABNORMAL LOW (ref >60)
Glucose, Bld: 107 mg/dL — ABNORMAL HIGH (ref 70–99)
Potassium: 4.1 mmol/L (ref 3.5–5.1)
Sodium: 139 mmol/L (ref 135–145)
Total Bilirubin: 0.8 mg/dL (ref 0.3–1.2)
Total Protein: 6.4 g/dL — ABNORMAL LOW (ref 6.5–8.1)

## 2022-03-21 NOTE — Progress Notes (Signed)
Hgb stable.  Capsule downloaded, to be read by my partner Dr. Michail Sermon this afternoon.  Next step in management pending capsule findings.

## 2022-03-21 NOTE — Progress Notes (Signed)
Consultation Progress Note   Patient: Kayla Mcclure [id number]:[id number] DOB: 04/11/1945 DOA: 03/19/2022 DOS: the patient was seen and examined on 03/21/2022 Primary service: DibiaManfred Shirts, MD  Brief hospital course: 76 y.o. female with medical history significant of hypertension, hyperlipidemia, CAD, PAD s/p stenting, CKD stage III, diverticulosis, and history of GI bleed who presents with complaints of chest pain.  She notes that yesterday she had a formed dark brown stool and when she flushed noticed that there was red blood present.  She had a second bowel movement with similar appearance later on.  By yesterday afternoon he reported feeling short of breath with lightheadedness, and chest pain whenever getting up and moving around.  Chest pain was left-sided upper chest and only present with exertion.  Noted associated symptoms of funny feeling in her left arm.  She had taken 1 nitroglycerin with some improvement in symptoms but reported whenever getting up or moving around symptoms recur.  She came to the hospital for further evaluation due to the symptoms.  Patient is on aspirin and Plavix, but denies taking any other NSAIDs or blood thinners.  Patient reports this last GI bleed occurred back in 08/2020.  EGD at that time noted a single nonbleeding duodenal AVM which was treated with APC by Dr. Watt Climes.  Patient also reports that he had recently had renal artery stenting to try and delay need for dialysis by vascular surgery Dr. Donzetta Matters in 12/2021.  She has been on aspirin and Plavix following the procedure.   In the emergency department patient was noted to be afebrile with pulse elevated up to 106, mild tachypnea, and blood pressures noted to be as low as 116/49.  Labs significant for hemoglobin 6.5, platelets 130, BUN 58, creatinine 1.59, INR 1.1, and high-sensitivity troponin 10.  Chest x-ray noted no acute cardiopulmonary disease.  Stool guaiac were noted to be positive.  Patient was typed and  screened and ordered 2 units of packed red blood cells.  However patient blood previously was noted to have antibodies for which blood transfusion has been delayed  Assessment and Plan: patient presents with hemoglobin down to 6.5.  Hemoglobin had been noted to be within normal limits at 13.3 last on 10/16.  Stool guaiacs were noted to be positive.  Dr. Paulita Fujita of [facility] had been consulted.  Patient had been on aspirin and Plavix.  Patient was typed and screened and ordered 2 units of packed red blood cells.  Blood products delayed due to patient having history of antibodies she was also given Protonix 80 mg IV.  Last hospitalized within GI bleed back in 08/2020 thought secondary to possible duodenal AVM.   -Monitor intake and output -Normal saline IV fluids 75 mL/h until blood products are able to be started -Transfuse 2 units of packed red blood cells once available -Hold  Plavix, Per Admitting physician who spoke to Dr Donzetta Matters with Vascular surgery patient is to stay on ASA for now due to recently placed renal stent. -Serial monitoring of H&H -Continue Protonix drip per protocol ---Pt swallowed pill cam at 0820 on 03/20/22  ---Capsule has been downloaded, would be read by GI. --Findings would determine next step in management  -Hb stable at 9.5   Chest painCAD Patient reported having complaints of chest pain with exertion.  Prior drug-eluting stent placement remotely.  High-sensitivity troponins negative x 2.  Likely related to acute blood loss. -Held Plavix due to acute bleeding, on ASA due to renal stents -Held beta-blocker due  to soft blood pressures   Heart failure with preserved EF Chronic.  Patient appears euvolemic at this time.  Last echocardiogram noted EF to be 60-65% with grade 2 diastolic dysfunction on 94/04/7406. -Continue to monitor intake and output  -Daily weights   Essential hypertension Blood pressures were noted to be somewhat soft . -Hold home blood pressure  medications at this time -Reevaluate and resume when medically appropriate   Thrombocytopenia Acute on chronic.  On chronic platelet count 130, but has been low similarly in the past.   -Continue to monitor   CKD stage IIIb artery stenosis Creatinine 1.59 with BUN 58.  Baseline creatinine previously 1.4 on 10/16.  Patient had just underwent bilateral renal stenting by Dr. Donzetta Matters on 10/16. -Continue to monitor kidney function -Will need to follow-up with vascular surgery regards to recommendations on antiplatelet therapy   Asthma, without acute exacerbation On physical exam no significant wheezing or rhonchi appreciated.  Suspect symptoms of shortness of breath are likely related to blood loss. -Albuterol nebs as needed for shortness of breath/wheezing   Dyslipidemia Home medication regimen includes Zetia and atorvastatin -Continue atorvastatin and Zetia.   PAD -Continue statin   DVT prophylaxis: SCDs Advance Care Planning:   Code Status: Full Code    Consults: [facility]       TRH will continue to follow the patient.  Subjective: No complaints this morning  Physical Exam: Vitals:   03/20/22 1944 03/21/22 0457 03/21/22 0500 03/21/22 0741  BP: (!) 117/50 (!) 125/53  (!) 123/54  Pulse: 89 79  85  Resp: '16 16  17  '$ Temp: 98 F (36.7 C) (!) 97.5 F (36.4 C) 97.9 F (36.6 C) 98 F (36.7 C)  TempSrc: Oral Oral Oral Oral  SpO2: 97% 92%  92%  Weight:      Height:       Constitutional: Elderly female currently in no acute distress Eyes: PERRL, lids and conjunctivae normal ENMT: Mucous membranes are moist.  Neck: normal, supple Respiratory: clear to auscultation bilaterally, no wheezing, no crackles. Normal respiratory effort. No accessory muscle use.  Cardiovascular: Regular rate and rhythm, no murmurs / rubs / gallops. No extremity edema.  Abdomen: Mild tenderness to palpation over the left side of the abdomen.  Bowel sounds present all 4 quadrants. Musculoskeletal: no  clubbing / cyanosis. No joint deformity upper and lower extremities. Good ROM, no contractures. Normal muscle tone.  Skin: no rashes, lesions, ulcers. No induration Neurologic: CN 2-12 grossly intact. Sensation intact, DTR normal. Strength 5/5 in all 4.  Psychiatric: Normal judgment and insight. Alert and oriented x 3. Normal mood.  Data Reviewed:  There are no new results to review at this time.  Family Communication:   Time spent: 15 minutes.  Author: Cristela Felt, MD 03/21/2022 1:23 PM  For on call review [email or web address].

## 2022-03-21 NOTE — Progress Notes (Signed)
Patient ID: Kayla Mcclure, female   DOB: 31-Dec-1945, 76 y.o.   MRN: [id number]  Capsule Endoscopy shows active GI bleeding with bright red blood in the proximal small bowel (5 minutes distal to first duodenal image) and 20 minutes distal to the first duodenal image there is a large amount of bright red blood and clots without a source seen. Small nonbleeding AVM near first images of blood. Next step recommended is a push enteroscopy and if no bleeding seen then will need a double balloon enteroscopy at a tertiary care center if shows ongoing signs of bleeding.   Plan: Clear liquid diet. NPO p MN. Enteroscopy tomorrow. D/W Dr. Paulita Fujita and Dr. Williams Che.

## 2022-03-21 NOTE — Plan of Care (Signed)

## 2022-03-22 ENCOUNTER — Observation Stay (HOSPITAL_COMMUNITY): Payer: Medicare Other | Admitting: Certified Registered Nurse Anesthetist

## 2022-03-22 ENCOUNTER — Observation Stay (HOSPITAL_BASED_OUTPATIENT_CLINIC_OR_DEPARTMENT_OTHER): Payer: Medicare Other | Admitting: Certified Registered Nurse Anesthetist

## 2022-03-22 ENCOUNTER — Encounter (HOSPITAL_COMMUNITY): Payer: Self-pay | Admitting: Internal Medicine

## 2022-03-22 ENCOUNTER — Encounter (HOSPITAL_COMMUNITY)
Admission: EM | Disposition: A | Discharge status: Home / Self Care | Payer: Self-pay | Source: Home / Self Care | Attending: Internal Medicine

## 2022-03-22 DIAGNOSIS — I509 Heart failure, unspecified: Secondary | ICD-10-CM

## 2022-03-22 DIAGNOSIS — I2510 Atherosclerotic heart disease of native coronary artery without angina pectoris: Secondary | ICD-10-CM | POA: Diagnosis not present

## 2022-03-22 DIAGNOSIS — F17200 Nicotine dependence, unspecified, uncomplicated: Secondary | ICD-10-CM | POA: Diagnosis not present

## 2022-03-22 DIAGNOSIS — I110 Hypertensive heart disease with heart failure: Secondary | ICD-10-CM | POA: Diagnosis not present

## 2022-03-22 DIAGNOSIS — K3189 Other diseases of stomach and duodenum: Secondary | ICD-10-CM

## 2022-03-22 DIAGNOSIS — F17210 Nicotine dependence, cigarettes, uncomplicated: Secondary | ICD-10-CM

## 2022-03-22 DIAGNOSIS — K259 Gastric ulcer, unspecified as acute or chronic, without hemorrhage or perforation: Secondary | ICD-10-CM | POA: Diagnosis not present

## 2022-03-22 DIAGNOSIS — D62 Acute posthemorrhagic anemia: Secondary | ICD-10-CM | POA: Diagnosis not present

## 2022-03-22 DIAGNOSIS — J45909 Unspecified asthma, uncomplicated: Secondary | ICD-10-CM | POA: Diagnosis not present

## 2022-03-22 DIAGNOSIS — Z7901 Long term (current) use of anticoagulants: Secondary | ICD-10-CM | POA: Diagnosis not present

## 2022-03-22 DIAGNOSIS — K31811 Angiodysplasia of stomach and duodenum with bleeding: Secondary | ICD-10-CM | POA: Diagnosis not present

## 2022-03-22 DIAGNOSIS — K449 Diaphragmatic hernia without obstruction or gangrene: Secondary | ICD-10-CM

## 2022-03-22 DIAGNOSIS — I252 Old myocardial infarction: Secondary | ICD-10-CM | POA: Diagnosis not present

## 2022-03-22 DIAGNOSIS — D509 Iron deficiency anemia, unspecified: Secondary | ICD-10-CM | POA: Diagnosis not present

## 2022-03-22 HISTORY — PX: ENTEROSCOPY: SHX5533

## 2022-03-22 SURGERY — ENTEROSCOPY
Anesthesia: Monitor Anesthesia Care

## 2022-03-22 MED ORDER — SODIUM CHLORIDE 0.9 % IV SOLN: INTRAVENOUS | Status: DC

## 2022-03-22 MED ORDER — PROPOFOL 500 MG/50ML IV EMUL
INTRAVENOUS | Status: DC | PRN
  Administered 2022-03-22: 100 ug/kg/min via INTRAVENOUS

## 2022-03-22 MED ORDER — LACTATED RINGERS IV SOLN: INTRAVENOUS | Status: DC

## 2022-03-22 MED ORDER — LIDOCAINE 2% (20 MG/ML) 5 ML SYRINGE
INTRAMUSCULAR | Status: DC | PRN
  Administered 2022-03-22: 40 mg via INTRAVENOUS

## 2022-03-22 MED ORDER — PROPOFOL 10 MG/ML IV BOLUS
INTRAVENOUS | Status: DC | PRN
  Administered 2022-03-22: 10 mg via INTRAVENOUS
  Administered 2022-03-22: 20 mg via INTRAVENOUS

## 2022-03-22 NOTE — Progress Notes (Signed)
Consultation Progress Note   Patient: Kayla Mcclure ZJQ:[id number] DOB: [birth year] DOA: 03/19/2022 DOS: the patient was seen and examined on 03/22/2022 Primary service: Bonnell Public, MD  Brief hospital course: 76 y.o. female with medical history significant of hypertension, hyperlipidemia, CAD, PAD s/p stenting, CKD stage III, diverticulosis, and history of GI bleed who presents with complaints of chest pain.  She notes that yesterday she had a formed dark brown stool and when she flushed noticed that there was red blood present.  She had a second bowel movement with similar appearance later on.  By yesterday afternoon he reported feeling short of breath with lightheadedness, and chest pain whenever getting up and moving around.  Chest pain was left-sided upper chest and only present with exertion.  Noted associated symptoms of funny feeling in her left arm.  She had taken 1 nitroglycerin with some improvement in symptoms but reported whenever getting up or moving around symptoms recur.  She came to the hospital for further evaluation due to the symptoms.  Patient is on aspirin and Plavix, but denies taking any other NSAIDs or blood thinners.  Patient reports this last GI bleed occurred back in 08/2020.  EGD at that time noted a single nonbleeding duodenal AVM which was treated with APC by Dr. Watt Climes.  Patient also reports that he had recently had renal artery stenting to try and delay need for dialysis by vascular surgery Dr. Donzetta Matters in 12/2021.  She has been on aspirin and Plavix following the procedure.   In the emergency department patient was noted to be afebrile with pulse elevated up to 106, mild tachypnea, and blood pressures noted to be as low as 116/49.  Labs significant for hemoglobin 6.5, platelets 130, BUN 58, creatinine 1.59, INR 1.1, and high-sensitivity troponin 10.  Chest x-ray noted no acute cardiopulmonary disease.  Stool guaiac were noted to be positive.  Patient was typed and  screened and ordered 2 units of packed red blood cells.  However patient blood previously was noted to have antibodies for which blood transfusion has been delayed  03/22/2022: Patient was admitted with symptomatic anemia (shortness of breath and chest pain).  Hemoglobin was 6.5 g/dL on presentation.  Patient has been transfused with packed red blood cells.  Patient seen.  No new complaints today.   Small bowel enteroscopy done today revealed:  - Small hiatal hernia. - Erosive gastropathy with no stigmata of recent bleeding. - Normal examined duodenum. - The examined portion of the jejunum was normal. - No bleeding identified; either bleeding on capsule has stopped or scope couldn't reach far enough distally.  Continue to monitor H/H as per GI recommendation.  Further management will depend on above.  No overt bleeding reported.  Assessment and Plan: patient presents with hemoglobin down to 6.5.  Hemoglobin had been noted to be within normal limits at 13.3 last on 10/16.  Stool guaiacs were noted to be positive.  Dr. Paulita Fujita of [facility] had been consulted.  Patient had been on aspirin and Plavix.  Patient was typed and screened and ordered 2 units of packed red blood cells.  Blood products delayed due to patient having history of antibodies she was also given Protonix 80 mg IV.  Last hospitalized within GI bleed back in 08/2020 thought secondary to possible duodenal AVM.   -Monitor intake and output -Normal saline IV fluids 75 mL/h until blood products are able to be started -Transfuse 2 units of packed red blood cells once available -Hold  Plavix, Per Admitting physician who spoke to Dr Donzetta Matters with Vascular surgery patient is to stay on ASA for now due to recently placed renal stent. -Serial monitoring of H&H -Continue Protonix drip per protocol ---Pt swallowed pill cam at 0820 on 03/20/22  ---Capsule has been downloaded, would be read by GI. --Findings would determine next step in  management  -Hb stable at 9.5  03/22/2022: See above documentation.   Chest painCAD Patient reported having complaints of chest pain with exertion.  Prior drug-eluting stent placement remotely.  High-sensitivity troponins negative x 2.  Likely related to acute blood loss. -Held Plavix due to acute bleeding, on ASA due to renal stents -Held beta-blocker due to soft blood pressures 03/22/2022: Resolved.   Heart failure with preserved EF Chronic.  Patient appears euvolemic at this time.  Last echocardiogram noted EF to be 60-65% with grade 2 diastolic dysfunction on 88/11/2798. -Continue to monitor intake and output  -Daily weights 03/22/2022: Compensated.   Essential hypertension Blood pressures were noted to be somewhat soft . -Hold home blood pressure medications at this time -Reevaluate and resume when medically appropriate 03/22/2022: Controlled.   Thrombocytopenia Acute on chronic.  On chronic platelet count 130, but has been low similarly in the past.   -Continue to monitor 03/22/2022: Stable.   CKD stage IIIa/b artery stenosis Creatinine 1.59 with BUN 58.  Baseline creatinine previously 1.4 on 10/16.  Patient had just underwent bilateral renal stenting by Dr. Donzetta Matters on 10/16. -Continue to monitor kidney function -Will need to follow-up with vascular surgery regards to recommendations on antiplatelet therapy 03/22/2022: Renal function stable.   Asthma, without acute exacerbation -Stable.   Dyslipidemia Home medication regimen includes Zetia and atorvastatin -Continue atorvastatin and Zetia.   PAD -Continue statin   DVT prophylaxis: SCDs Advance Care Planning:   Code Status: Full Code    Consults: [facility]   Subjective:  No complaints this morning  Physical Exam: Vitals:   03/22/22 0951 03/22/22 1125 03/22/22 1140 03/22/22 1410  BP: 137/64 115/63 120/60 (!) 112/56  Pulse: 87 84 74 86  Resp: '18 18 17 20  '$ Temp: 97.6 F (36.4 C) 97.7 F (36.5 C) 97.7 F  (36.5 C) 98.4 F (36.9 C)  TempSrc: Temporal   Oral  SpO2: 93% 96% 96% 92%  Weight: 55 kg     Height: '4\' 11"'$  (1.499 m)      Constitutional: Awake and alert.  Friendly.  Not in any distress. Eyes: Patient is pale.  Neck: Supple.   Respiratory: clear to auscultation  Cardiovascular: L4-J1, systolic murmur.   Abdomen: Soft and nontender.   Neurologic: Awake and alert.  Moves all extremities.    Data Reviewed:  There are no new results to review at this time.  Family Communication:   Time spent: 55 minutes.  Author: Bonnell Public, MD 03/22/2022 5:37 PM  For on call review [email or web address].

## 2022-03-22 NOTE — Anesthesia Postprocedure Evaluation (Signed)
Anesthesia Post Note  Patient: Kayla Mcclure  Procedure(s) Performed: ENTEROSCOPY     Patient location during evaluation: Endoscopy Anesthesia Type: MAC Level of consciousness: awake and alert Pain management: pain level controlled Vital Signs Assessment: post-procedure vital signs reviewed and stable Respiratory status: spontaneous breathing, nonlabored ventilation, respiratory function stable and patient connected to nasal cannula oxygen Cardiovascular status: stable and blood pressure returned to baseline Postop Assessment: no apparent nausea or vomiting Anesthetic complications: no  No notable events documented.  Last Vitals:  Vitals:   03/22/22 1125 03/22/22 1140  BP: 115/63 120/60  Pulse: 84 74  Resp: 18 17  Temp: 36.5 C 36.5 C  SpO2: 96% 96%    Last Pain:  Vitals:   03/22/22 1140  TempSrc:   PainSc: 0-No pain                 Lyndol Vanderheiden,W. EDMOND

## 2022-03-22 NOTE — Plan of Care (Signed)

## 2022-03-22 NOTE — Anesthesia Preprocedure Evaluation (Addendum)
Anesthesia Evaluation  Patient identified by MRN, date of birth, ID band Patient awake    Reviewed: Allergy & Precautions, H&P , NPO status , Patient's Chart, lab work & pertinent test results, reviewed documented beta blocker date and time   Airway Mallampati: II  TM Distance: >3 FB Neck ROM: Full    Dental no notable dental hx. (+) Partial Upper, Edentulous Lower, Dental Advisory Given   Pulmonary asthma , Current Smoker   Pulmonary exam normal breath sounds clear to auscultation       Cardiovascular hypertension, Pt. on medications and Pt. on home beta blockers + CAD, + Past MI, + Cardiac Stents, + Peripheral Vascular Disease, +CHF and + DOE   Rhythm:Regular Rate:Normal     Neuro/Psych CVA  negative psych ROS   GI/Hepatic Neg liver ROS,GERD  Medicated,,  Endo/Other  negative endocrine ROS    Renal/GU Renal InsufficiencyRenal disease  negative genitourinary   Musculoskeletal   Abdominal   Peds  Hematology  (+) Blood dyscrasia, anemia   Anesthesia Other Findings   Reproductive/Obstetrics negative OB ROS                             Anesthesia Physical Anesthesia Plan  ASA: 3  Anesthesia Plan: MAC   Post-op Pain Management: Minimal or no pain anticipated   Induction: Intravenous  PONV Risk Score and Plan: 1 and Propofol infusion  Airway Management Planned: Natural Airway and Nasal Cannula  Additional Equipment:   Intra-op Plan:   Post-operative Plan:   Informed Consent: I have reviewed the patients History and Physical, chart, labs and discussed the procedure including the risks, benefits and alternatives for the proposed anesthesia with the patient or authorized representative who has indicated his/her understanding and acceptance.     Dental advisory given  Plan Discussed with: CRNA  Anesthesia Plan Comments:        Anesthesia Quick Evaluation

## 2022-03-22 NOTE — Transfer of Care (Signed)
Immediate Anesthesia Transfer of Care Note  Patient: Kayla Mcclure  Procedure(s) Performed: ENTEROSCOPY  Patient Location: PACU  Anesthesia Type:MAC  Level of Consciousness: awake, alert , patient cooperative, and responds to stimulation  Airway & Oxygen Therapy: Patient Spontanous Breathing and Patient connected to nasal cannula oxygen  Post-op Assessment: Report given to RN and Post -op Vital signs reviewed and stable  Post vital signs: Reviewed and stable  Last Vitals:  Vitals Value Taken Time  BP 115/63 03/22/22 1122  Temp    Pulse 85 03/22/22 1124  Resp 23 03/22/22 1124  SpO2 96 % 03/22/22 1124  Vitals shown include unvalidated device data.  Last Pain:  Vitals:   03/22/22 0951  TempSrc: Temporal  PainSc: 0-No pain      Patients Stated Pain Goal: 0 (94/76/54 6503)  Complications: No notable events documented.

## 2022-03-22 NOTE — Interval H&P Note (Signed)
History and Physical Interval Note:  03/22/2022 10:50 AM  Kayla Mcclure Needs  has presented today for surgery, with the diagnosis of GI Bleed.  The various methods of treatment have been discussed with the patient and family. After consideration of risks, benefits and other options for treatment, the patient has consented to  Procedure(s): ENTEROSCOPY (N/A) as a surgical intervention.  The patient's history has been reviewed, patient examined, no change in status, stable for surgery.  I have reviewed the patient's chart and labs.  Questions were answered to the patient's satisfaction.     [name] M  Suspected bleeding in ?jejunum on capsule, enteroscopy today for further evaluation.

## 2022-03-22 NOTE — Op Note (Signed)
[facility] Patient Name: Kayla Mcclure Procedure Date : 03/22/2022 MRN: [id number] Attending MD: Arta Silence , MD, [id number] Date of Birth: 03/30/1946 CSN: [id number] Age: 76 Admit Type: Inpatient Procedure:                Small bowel enteroscopy Indications:              Acute post hemorrhagic anemia, Iron deficiency                            anemia, abnormal capsule (blood in jejunum?, 20                            minutes after capsule entering duodenum) Providers:                Arta Silence, MD, Jaci Carrel, RN, [facility]                            Technician, Technician Referring MD:             [facility]. Medicines:                Monitored Anesthesia Care Complications:            No immediate complications. Estimated Blood Loss:     Estimated blood loss: none. Procedure:                Pre-Anesthesia Assessment:                           - Prior to the procedure, a History and Physical                            was performed, and patient medications and                            allergies were reviewed. The patient's tolerance of                            previous anesthesia was also reviewed. The risks                            and benefits of the procedure and the sedation                            options and risks were discussed with the patient.                            All questions were answered, and informed consent                            was obtained. Prior Anticoagulants: The patient has                            taken Plavix (clopidogrel), last dose was 5 days  prior to procedure. ASA Grade Assessment: III - A                            patient with severe systemic disease. After                            reviewing the risks and benefits, the patient was                            deemed in satisfactory condition to undergo the                            procedure.                            After obtaining informed consent, the endoscope was                            passed under direct vision. Throughout the                            procedure, the patient's blood pressure, pulse, and                            oxygen saturations were monitored continuously. The                            PCF-H190TL ([id number]) Olympus slim colonoscope was                            introduced through the mouth and advanced to the                            mid-jejunum. The small bowel enteroscopy was                            accomplished without difficulty. The patient                            tolerated the procedure well. Scope In: Scope Out: Findings:      A small hiatal hernia was present.      A few dispersed diminutive erosions with no stigmata of recent bleeding       were found in the cardia and in the gastric fundus.      The exam of the esophagus was otherwise normal.      The exam of the stomach was otherwise normal.      There was no evidence of significant pathology in the entire examined       duodenum.      There was no evidence of significant pathology in the proximal jejunum       and in the mid-jejunum.      No old or fresh blood was seen to the extent of our examination. Impression:               - Small hiatal hernia.                           -  Erosive gastropathy with no stigmata of recent                            bleeding.                           - Normal examined duodenum.                           - The examined portion of the jejunum was normal.                           - No bleeding identified; either bleeding on                            capsule has stopped or scope couldn't reach far                            enough distally. Recommendation:           - Return patient to hospital ward for ongoing care.                           - Soft diet today.                           - Follow CBC.                           - Continue PPI.                            - Continue to hold Plavix.                           - If Hgb stable, would likely manage expectantly;                            if Hgb drops over next 1-2 days, would have to                            consider inpatient transfer to tertiary center for                            single/double balloon enteroscopy.                           [facility] will follow. Procedure Code(s):        --- Professional ---                           [phone], Small intestinal endoscopy, enteroscopy                            beyond second portion of duodenum, not including  ileum; diagnostic, including collection of                            specimen(s) by brushing or washing, when performed                            (separate procedure) Diagnosis Code(s):        --- Professional ---                           K44.9, Diaphragmatic hernia without obstruction or                            gangrene                           K31.89, Other diseases of stomach and duodenum                           D62, Acute posthemorrhagic anemia                           D50.9, Iron deficiency anemia, unspecified CPT copyright 2022 [employer] Association. All rights reserved. The codes documented in this report are preliminary and upon coder review may  be revised to meet current compliance requirements. Arta Silence, MD 03/22/2022 11:26:41 AM This report has been signed electronically. Number of Addenda: 0

## 2022-03-23 DIAGNOSIS — R072 Precordial pain: Secondary | ICD-10-CM | POA: Diagnosis present

## 2022-03-23 DIAGNOSIS — I5032 Chronic diastolic (congestive) heart failure: Secondary | ICD-10-CM | POA: Diagnosis present

## 2022-03-23 DIAGNOSIS — Z833 Family history of diabetes mellitus: Secondary | ICD-10-CM | POA: Diagnosis not present

## 2022-03-23 DIAGNOSIS — I739 Peripheral vascular disease, unspecified: Secondary | ICD-10-CM | POA: Diagnosis present

## 2022-03-23 DIAGNOSIS — I130 Hypertensive heart and chronic kidney disease with heart failure and stage 1 through stage 4 chronic kidney disease, or unspecified chronic kidney disease: Secondary | ICD-10-CM | POA: Diagnosis present

## 2022-03-23 DIAGNOSIS — Z8673 Personal history of transient ischemic attack (TIA), and cerebral infarction without residual deficits: Secondary | ICD-10-CM | POA: Diagnosis not present

## 2022-03-23 DIAGNOSIS — K5790 Diverticulosis of intestine, part unspecified, without perforation or abscess without bleeding: Secondary | ICD-10-CM | POA: Diagnosis present

## 2022-03-23 DIAGNOSIS — I25119 Atherosclerotic heart disease of native coronary artery with unspecified angina pectoris: Secondary | ICD-10-CM | POA: Diagnosis present

## 2022-03-23 DIAGNOSIS — Z955 Presence of coronary angioplasty implant and graft: Secondary | ICD-10-CM | POA: Diagnosis not present

## 2022-03-23 DIAGNOSIS — E785 Hyperlipidemia, unspecified: Secondary | ICD-10-CM | POA: Diagnosis present

## 2022-03-23 DIAGNOSIS — N1832 Chronic kidney disease, stage 3b: Secondary | ICD-10-CM | POA: Diagnosis present

## 2022-03-23 DIAGNOSIS — Z7901 Long term (current) use of anticoagulants: Secondary | ICD-10-CM | POA: Diagnosis not present

## 2022-03-23 DIAGNOSIS — F17210 Nicotine dependence, cigarettes, uncomplicated: Secondary | ICD-10-CM | POA: Diagnosis present

## 2022-03-23 DIAGNOSIS — K219 Gastro-esophageal reflux disease without esophagitis: Secondary | ICD-10-CM | POA: Diagnosis present

## 2022-03-23 DIAGNOSIS — J45909 Unspecified asthma, uncomplicated: Secondary | ICD-10-CM | POA: Diagnosis present

## 2022-03-23 DIAGNOSIS — D509 Iron deficiency anemia, unspecified: Secondary | ICD-10-CM | POA: Diagnosis present

## 2022-03-23 DIAGNOSIS — K921 Melena: Secondary | ICD-10-CM | POA: Diagnosis not present

## 2022-03-23 DIAGNOSIS — I252 Old myocardial infarction: Secondary | ICD-10-CM | POA: Diagnosis not present

## 2022-03-23 DIAGNOSIS — D696 Thrombocytopenia, unspecified: Secondary | ICD-10-CM | POA: Diagnosis present

## 2022-03-23 DIAGNOSIS — D62 Acute posthemorrhagic anemia: Secondary | ICD-10-CM | POA: Diagnosis present

## 2022-03-23 LAB — RENAL FUNCTION PANEL
Albumin: 2.6 g/dL — ABNORMAL LOW (ref 3.5–5.0)
Anion gap: 5 (ref 5–15)
BUN: 19 mg/dL (ref 8–23)
CO2: 27 mmol/L (ref 22–32)
Calcium: 8.9 mg/dL (ref 8.9–10.3)
Chloride: 107 mmol/L (ref 98–111)
Creatinine, Ser: 1.21 mg/dL — ABNORMAL HIGH (ref 0.44–1.00)
GFR, Estimated: 47 mL/min — ABNORMAL LOW (ref >60)
Glucose, Bld: 88 mg/dL (ref 70–99)
Phosphorus: 3.7 mg/dL (ref 2.5–4.6)
Potassium: 4.2 mmol/L (ref 3.5–5.1)
Sodium: 139 mmol/L (ref 135–145)

## 2022-03-23 LAB — CBC WITH DIFFERENTIAL/PLATELET
Abs Immature Granulocytes: 0.03 10*3/uL (ref 0.00–0.07)
Basophils Absolute: 0 10*3/uL (ref 0.0–0.1)
Basophils Relative: 1 %
Eosinophils Absolute: 0.2 10*3/uL (ref 0.0–0.5)
Eosinophils Relative: 4 %
HCT: 24.4 % — ABNORMAL LOW (ref 36.0–46.0)
Hemoglobin: 8.1 g/dL — ABNORMAL LOW (ref 12.0–15.0)
Immature Granulocytes: 1 %
Lymphocytes Relative: 23 %
Lymphs Abs: 1.5 10*3/uL (ref 0.7–4.0)
MCH: 29.6 pg (ref 26.0–34.0)
MCHC: 33.2 g/dL (ref 30.0–36.0)
MCV: 89.1 fL (ref 80.0–100.0)
Monocytes Absolute: 1 10*3/uL (ref 0.1–1.0)
Monocytes Relative: 16 %
Neutro Abs: 3.6 10*3/uL (ref 1.7–7.7)
Neutrophils Relative %: 55 %
Platelets: 161 10*3/uL (ref 150–400)
RBC: 2.74 MIL/uL — ABNORMAL LOW (ref 3.87–5.11)
RDW: 17.3 % — ABNORMAL HIGH (ref 11.5–15.5)
WBC: 6.3 10*3/uL (ref 4.0–10.5)
nRBC: 0 % (ref 0.0–0.2)

## 2022-03-23 LAB — GLUCOSE, CAPILLARY
Glucose-Capillary: 85 mg/dL (ref 70–99)
Glucose-Capillary: 128 mg/dL — ABNORMAL HIGH (ref 70–99)

## 2022-03-23 MED ORDER — METOPROLOL SUCCINATE ER 50 MG PO TB24: 50.0000 mg | ORAL_TABLET | Freq: Every day | ORAL | 0 refills | Status: DC

## 2022-03-23 MED ORDER — VALSARTAN 40 MG PO TABS: 40.0000 mg | ORAL_TABLET | Freq: Every day | ORAL | 0 refills | Status: DC

## 2022-03-23 MED ORDER — PANTOPRAZOLE SODIUM 40 MG PO TBEC: 40.0000 mg | DELAYED_RELEASE_TABLET | Freq: Two times a day (BID) | ORAL | 0 refills | Status: DC

## 2022-03-23 NOTE — Discharge Summary (Signed)
Physician Discharge Summary  Patient ID: Kayla Mcclure MRN: [id number] DOB/AGE: Apr 19, 1945 76 y.o.  Admit date: 03/19/2022 Discharge date: 03/23/2022  Admission Diagnoses:  Discharge Diagnoses:  Principal Problem:   ABLA (acute blood loss anemia) Active Problems:   Essential hypertension, benign   Asthma   PAD (peripheral artery disease) - bilateral CIA & CFA 50-69%; Claudication   Dyslipidemia, goal LDL below 70   GI bleed   Chest pain   Symptomatic anemia   Coronary artery disease involving native coronary artery of native heart with angina pectoris (HCC)   CKD (chronic kidney disease) stage 3, GFR 30-59 ml/min (HCC)   Heart failure with preserved ejection fraction (HCC)   Thrombocytopenia ([place])   Discharged Condition: stable.  Hospital Course: Patient is a 76 year old female with medical history significant for hypertension, hyperlipidemia, coronary artery disease, peripheral artery disease s/p stenting, chronic kidney disease stage III, diverticulosis, nonbleeding duodenal AVM and history of GI bleed.  Patient was admitted with symptomatic anemia (chest pain and shortness of breath), hemoglobin of 6.5 g/dL and dark stool.  High-sensitivity troponin was only 10.  BUN was 58 with serum creatinine of 1.5 plan on presentation, and INR of 1.1.  Apparently, patient was on aspirin and Plavix prior to presentation.  Patient was admitted for further assessment and management.  Serial monitoring of hemoglobin and hematocrit close started.  Patient was managed with Protonix drip.  Plavix was held.  Vascular surgery team advised continuing aspirin as patient had recently placed renal stent.  Patient was transfused with packed red blood cells.  Capsule endoscopy was started on 03/20/2022.  Capsule endoscopy came back abnormal.  Jejunal bleed was questioned.  GI team proceeded with enteroscopy.  Enteroscopy was nonrevealing.  Patient was managed supportively.  Chest pain and shortness of  breath have resolved.  As documented elevated, troponins came back negative.  Patient has been cleared for discharge by the GI team.  Patient has been advised to go to [facility] or [facility] if bleeding reoccurs and patient is stable enough to travel to [place] or [facility].  GI team and PCP will determine when to restart Plavix.     GI bleed/acute blood loss anemia: -See above documentation. -Admitted with symptomatic anemia. -Patient endorsed shortness of breath and chest pain. -Troponins came back negative. -Hemoglobin of 6.5 g/dL on presentation. -Patient was transfused with packed red blood cells. -Patient underwent capsule endoscopy and enteroscopy. -Patient will be discharged back home to the care of the primary care provider and the GI team. -Plavix is currently on hold.  GI team and PCP will determine when to restart Plavix.  Chest painCAD Patient reported having complaints of chest pain with exertion.  Prior drug-eluting stent placement remotely.  High-sensitivity troponins negative x 2.  Likely related to acute blood loss. -Held Plavix due to acute bleeding, on ASA due to renal stents -Held beta-blocker due to soft blood pressures   Heart failure with preserved EF Chronic.  Patient appears euvolemic at this time.  Last echocardiogram noted EF to be 60-65% with grade 2 diastolic dysfunction on 69/09/7891. -Continued to monitor intake and output  -Daily weights   Essential hypertension Blood pressures were noted to be somewhat soft on presentation.   Thrombocytopenia Acute on chronic.   -Continue to monitor   CKD stage IIIa/b artery stenosis Creatinine 1.59 with BUN 58.  Baseline creatinine previously 1.4 on 01/15/2022.   -Patient recently underwent bilateral renal stenting by Dr. Donzetta Matters on 01/15/2022. -Continued to  monitor kidney function -Will need to follow-up with vascular surgery regards to recommendations on antiplatelet therapy -Renal function was stable.    Asthma, without acute exacerbation -Stable.   Dyslipidemia Home medication regimen included Zetia and atorvastatin   PAD -Continue statin    Consults: GI  Significant Diagnostic Studies:  Enteroscopy revealed: -Hiatal hernia. -Erosive gastropathy. -Normal duodenum and jejunum.  Capsule endoscopy: -Abnormal. -Query jejunal bleed.   Discharge Exam: Blood pressure (!) 105/52, pulse 96, temperature 98 F (36.7 C), temperature source Oral, resp. rate 19, height '4\' 11"'$  (1.499 m), weight 55 kg, SpO2 95 %.   Disposition: Discharge disposition: 01-Home or Self Care.  Follow-up with primary care provider and GI team within 1 week of discharge.  Repeat CBC on 03/27/2022 at the PCP or GI provider's office.  Plavix is currently on hold.  GI team to decide when to restart Plavix (on the PCP).  If GI bleed reoccurs and patient is stable, GI team has advised that patient should try to go to [facility] or [facility] as the patient will need a single/double balloon enteroscopy.   Discharge Instructions     Diet - low sodium heart healthy   Complete by: As directed    Increase activity slowly   Complete by: As directed       Allergies as of 03/23/2022   No Known Allergies      Medication List     STOP taking these medications    amLODipine 5 MG tablet Commonly known as: NORVASC   clopidogrel 75 MG tablet Commonly known as: Plavix   furosemide 20 MG tablet Commonly known as: LASIX   isosorbide mononitrate 60 MG 24 hr tablet Commonly known as: IMDUR   nitroGLYCERIN 0.4 MG SL tablet Commonly known as: NITROSTAT   ondansetron 4 MG tablet Commonly known as: ZOFRAN       TAKE these medications    acetaminophen 325 MG tablet Commonly known as: TYLENOL Take 650 mg by mouth every 6 (six) hours as needed for mild pain or moderate pain.   albuterol 108 (90 Base) MCG/ACT inhaler Commonly known as: VENTOLIN HFA Inhale 2 puffs into the lungs every 6 (six) hours as needed for  wheezing or shortness of breath.   alendronate 70 MG tablet Commonly known as: FOSAMAX Take 70 mg by mouth once a week.   aspirin EC 81 MG tablet Take 1 tablet (81 mg total) by mouth daily.   atorvastatin 80 MG tablet Commonly known as: LIPITOR Take 1 tablet (80 mg total) by mouth daily.   ezetimibe 10 MG tablet Commonly known as: ZETIA Take 1 tablet (10 mg total) by mouth daily.   ferrous gluconate 324 MG tablet Commonly known as: FERGON Take 324 mg by mouth daily with breakfast.   metoprolol succinate 50 MG 24 hr tablet Commonly known as: TOPROL-XL Take 1 tablet (50 mg total) by mouth at bedtime. What changed:  medication strength how much to take   pantoprazole 40 MG tablet Commonly known as: PROTONIX Take 1 tablet (40 mg total) by mouth 2 (two) times daily. What changed: when to take this   raloxifene 60 MG tablet Commonly known as: EVISTA Take 60 mg by mouth daily.   valsartan 40 MG tablet Commonly known as: DIOVAN Take 1 tablet (40 mg total) by mouth daily. What changed:  medication strength how much to take        Follow-up Information     Lois Huxley, PA Follow up.   Specialty:  Family Medicine Contact information: [place] [id number] [phone]                 Time spent: 36 minutes.  SignedBonnell Public 03/23/2022, 1:06 PM

## 2022-03-23 NOTE — Progress Notes (Signed)
Subjective: No further dark stools or blood in stool. Wants to go home.  Objective: Vital signs in last 24 hours: Temp:  [97.6 F (36.4 C)-98.4 F (36.9 C)] 98 F (36.7 C) (12/22 0356) Pulse Rate:  [74-96] 96 (12/22 0356) Resp:  [17-20] 19 (12/22 0356) BP: (105-137)/(52-64) 105/52 (12/22 0356) SpO2:  [92 %-96 %] 95 % (12/22 0356) Weight:  [55 kg] 55 kg (12/21 0951) Weight change:  Last BM Date : 03/21/22  PE: GEN:  NAD NEURO:  No encephalopathy ABD:  Soft, non-tender  Lab Results: CBC    Component Value Date/Time   WBC 6.3 03/23/2022 0325   RBC 2.74 (L) 03/23/2022 0325   HGB 8.1 (L) 03/23/2022 0325   HGB 12.0 02/05/2019 0836   HCT 24.4 (L) 03/23/2022 0325   HCT 38.5 02/05/2019 0836   PLT 161 03/23/2022 0325   PLT 212 02/05/2019 0836   MCV 89.1 03/23/2022 0325   MCV 81 02/05/2019 0836   MCH 29.6 03/23/2022 0325   MCHC 33.2 03/23/2022 0325   RDW 17.3 (H) 03/23/2022 0325   RDW 17.3 (H) 02/05/2019 0836   LYMPHSABS 1.5 03/23/2022 0325   MONOABS 1.0 03/23/2022 0325   EOSABS 0.2 03/23/2022 0325   BASOSABS 0.0 03/23/2022 0325  CMP     Component Value Date/Time   NA 139 03/23/2022 0325   NA 143 02/11/2020 0904   K 4.2 03/23/2022 0325   CL 107 03/23/2022 0325   CO2 27 03/23/2022 0325   GLUCOSE 88 03/23/2022 0325   BUN 19 03/23/2022 0325   BUN 26 02/11/2020 0904   CREATININE 1.21 (H) 03/23/2022 0325   CREATININE 1.31 (H) 05/07/2013 0947   CALCIUM 8.9 03/23/2022 0325   PROT 6.4 (L) 03/21/2022 0958   PROT 7.1 02/05/2019 0836   ALBUMIN 2.6 (L) 03/23/2022 0325   ALBUMIN 4.0 02/05/2019 0836   AST 19 03/21/2022 0958   ALT 9 03/21/2022 0958   ALKPHOS 38 03/21/2022 0958   BILITOT 0.8 03/21/2022 0958   BILITOT 0.8 02/05/2019 0836   GFRNONAA 47 (L) 03/23/2022 0325   GFRAA 45 (L) 02/11/2020 0904   Assessment:  Anemia.  Hemoccult positive.  Multiple prior investigations extending at least back to 2017.  Most recent evaluation endoscopy June 2022. Prior endoscopies,  enteroscopies, colonoscopies and capsule endoscopies. Chronic anticoagulation. Blood in stool.  Bleeding in jejunum(?) seen on capsule endoscopy; enteroscopy yesterday showed no old/fresh blood and no site of prior bleeding. Chest pain, likely from anemia. History coronary and cerebrovascular disease. CKD, reports recent renal intervention.  Plan:   Patient's hgb dropped 1 gram but she's otherwise feeling well.   Continue PPI. I feel it's ok to send patient home with follow-up CBC with [facility] or her PCP as outpatient next Tuesday December 26th. If patient has continued drop in Hgb as outpatient, I would suggest she go straight to ED at [facility], if she is medically stable (which she has been during the entirety of this hospital stay), as what she needs is single/double balloon enteroscopy which is not offered here.  If her Hgb drops and she is simply sent back to [facility] ED, then she will likely be involved in a several day wait (if at all) to get a bed at [facility] or [place].   Landry Dyke 03/23/2022, 9:10 AM   Cell [phone] If no answer or after 5 PM call [phone]

## 2022-03-26 ENCOUNTER — Encounter (HOSPITAL_COMMUNITY): Payer: Self-pay | Admitting: Gastroenterology

## 2022-03-28 DIAGNOSIS — D509 Iron deficiency anemia, unspecified: Secondary | ICD-10-CM | POA: Diagnosis not present

## 2022-04-03 DIAGNOSIS — I5032 Chronic diastolic (congestive) heart failure: Secondary | ICD-10-CM | POA: Diagnosis not present

## 2022-04-03 DIAGNOSIS — R0789 Other chest pain: Secondary | ICD-10-CM | POA: Diagnosis not present

## 2022-04-03 DIAGNOSIS — K921 Melena: Secondary | ICD-10-CM | POA: Diagnosis not present

## 2022-04-03 DIAGNOSIS — D500 Iron deficiency anemia secondary to blood loss (chronic): Secondary | ICD-10-CM | POA: Diagnosis not present

## 2022-04-03 DIAGNOSIS — I25119 Atherosclerotic heart disease of native coronary artery with unspecified angina pectoris: Secondary | ICD-10-CM | POA: Diagnosis not present

## 2022-04-03 DIAGNOSIS — J45909 Unspecified asthma, uncomplicated: Secondary | ICD-10-CM | POA: Diagnosis not present

## 2022-04-04 DIAGNOSIS — D500 Iron deficiency anemia secondary to blood loss (chronic): Secondary | ICD-10-CM | POA: Diagnosis not present

## 2022-04-04 DIAGNOSIS — Z800 Family history of malignant neoplasm of digestive organs: Secondary | ICD-10-CM | POA: Diagnosis not present

## 2022-04-04 DIAGNOSIS — K5521 Angiodysplasia of colon with hemorrhage: Secondary | ICD-10-CM | POA: Diagnosis not present

## 2022-04-05 DIAGNOSIS — I2510 Atherosclerotic heart disease of native coronary artery without angina pectoris: Secondary | ICD-10-CM | POA: Diagnosis not present

## 2022-04-05 DIAGNOSIS — E785 Hyperlipidemia, unspecified: Secondary | ICD-10-CM | POA: Diagnosis not present

## 2022-04-05 DIAGNOSIS — J4530 Mild persistent asthma, uncomplicated: Secondary | ICD-10-CM | POA: Diagnosis not present

## 2022-04-05 DIAGNOSIS — I10 Essential (primary) hypertension: Secondary | ICD-10-CM | POA: Diagnosis not present

## 2022-04-11 ENCOUNTER — Telehealth: Payer: Self-pay | Admitting: *Deleted

## 2022-04-11 NOTE — Telephone Encounter (Signed)
-----   Message from Leonie Man, MD sent at 04/07/2022  1:42 PM EST ----- 14-day Zio patch October 24-February 06, 2022   Predominant underlying Rhythm: Sinus.  HR range 50-120 bpm, avg 67 bpm.   Occasional(1.2%) PACs with rare couplets and triplets.  No PVCs.   1 patient triggered event and sinus rhythm.   188 Atrial Runs: Fastest Run was 14 beats wiht HR range 114-169 bpm (avg 149 bpm, 5.9 sec); Longests Run was 18 beats (9.3 sec) with HR range 86-150 bpm & avg 117 bpm.   No Sustained Arrhythmias: Atrial Tachycardia (AT), Supraventricular Tachycardia (SVT), Atrial Fibrillation (A-Fib), Atrial Flutter (A-Flutter), Sustained Ventricular Tachycardia (VT)   Overall, pretty benign study.  Although there seems to be a lot of atrial runs, they are very short-lived and not likely to cause any symptoms as they were not noted on patient monitor.  These episodes are very short lasting 6 to 9 seconds and are nonsustained.   Would not necessarily change treatment based on this result.     [name], MD

## 2022-04-11 NOTE — Telephone Encounter (Signed)
The patient has been notified of the result and verbalized understanding.  All questions (if any) were answered. Raiford Simmonds, RN 04/11/2022 3:43 PM     Patient states she was recently in the  hospital  and follow up with primary.  Per patient , her primary wanted her to f/u with cardiology.  Patient is still experiencing issue with heart feel like it is racing at times and she becomes weak .   Offered next available appointment  in the office with a provider she has seen in the past.   Patient verbalized understanding.  04/13/22 at 10:05 . Patient aware to her by 9:45 am.

## 2022-04-12 NOTE — Progress Notes (Addendum)
Cardiology Clinic Note   Patient Name: Kayla Mcclure Date of Encounter: 04/13/2022  Primary Care Provider:  Lois Huxley, PA Primary Cardiologist:  [name], MD  Patient Profile    77 year old female with history of CAD s/p PCI of RCA with Promus DES 2023 with some distal vessel disease that seem to be relatively small caliber;chronic PAD with PTCA to the L Com and External Iliac 205 by Dr.Berry, Carotid Artery Disease with endarterectomy on the right with patch angioplasty by Dr. Donzetta Matters 08/20/21, endarterectomy on the left 07/28/2019; chronic kidney disease s/p bilateral renal stents, chronic DOE, HTN, near syncope. Recent hospitalization for acute blood loss anemia,and chest pain symptoms.Hgb 6.5.GI consultation with enteroscopy revealing erosive gastropathy, without stigmata of bleeding, no bleeding identified.She was given transfusion.Thrombocytopenia was identified. Plavix was put on hold, BB was held due to soft BP.   Past Medical History    Past Medical History:  Diagnosis Date   Acute GI bleeding 08/07/2019   Asthma 10/31/2007   CAD (coronary artery disease) 08/05/2007   a) NSTEMI 2009: PCI to the RCA Promus DES 2.5 mm x 23 mm (3.0 mm). b) Myoview June 2017: Nonspecific ST changes. EF greater than 65%. LOW RISK, normal study. No ischemia or infarction.   Carotid artery disease [facility])    s/p Bilateral CEA March & May 2020.    Chronic back pain    Chronic diastolic heart failure (HCC)    CKD (chronic kidney disease), stage II    CKD (chronic kidney disease), stage III ([place])    Diverticulosis 04/11/2010   Colonoscopy   Dyslipidemia, goal LDL below 70    Essential hypertension    a. Normal renal arteries by PV angio 05/2013.   GERD (gastroesophageal reflux disease)    GI bleed 12/2015   History of kidney stones 1975   surgery to remove   History of recent blood transfusion 12/2015   12-19-15, 12-20-15 and 12-21-15   Insomnia    Internal hemorrhoids 04/11/2010   By  colonoscopy   Iron deficiency anemia    Lung nodules 05/2021   bilateral   Myocardial infarction [facility]) 2009   Peripheral arterial disease ([place])    a. s/p PTA and stenting of L CIA stenosis 05/2013.   Renal insufficiency    Stroke [facility])    mini stroke   Tobacco abuse    Quit in 05/2013   Past Surgical History:  Procedure Laterality Date   BRONCHIAL BIOPSY  06/06/2021   Procedure: BRONCHIAL BIOPSIES;  Surgeon: Garner Nash, DO;  Location: [place] [facility]COPY;  Service: Pulmonary;;   BRONCHIAL BRUSHINGS  06/06/2021   Procedure: BRONCHIAL BRUSHINGS;  Surgeon: Garner Nash, DO;  Location: [place] [facility]OSCOPY;  Service: Pulmonary;;   BRONCHIAL NEEDLE ASPIRATION BIOPSY  06/06/2021   Procedure: BRONCHIAL NEEDLE ASPIRATION BIOPSIES;  Surgeon: Garner Nash, DO;  Location: [place] [facility]COPY;  Service: Pulmonary;;   CAPSULE ENDOSCOPY  10/27/2019   [facility]) evidence of "pinhole leaking" in both stomach and proximal small bowel; no overt bleeding noted   CHOLECYSTECTOMY  1975   COLONOSCOPY  04/11/2010   COLONOSCOPY  11/30/2015   CORONARY ANGIOPLASTY WITH STENT PLACEMENT  08/05/2007   Promus DES 2.5 mm x 23 mm (3 mm)  to RCA; EF 60-70%   ENDARTERECTOMY Left 07/28/2019   Procedure: ENDARTERECTOMY CAROTID;  Surgeon: Waynetta Sandy, MD;  Location: [facility];  Service: Vascular;  Laterality: Left;   ENDARTERECTOMY Right 08/27/2019   Procedure: ENDARTERECTOMY CAROTID RIGHT WITH PATCH  ANGIOPLASTY;  Surgeon: Waynetta Sandy, MD;  Location: [place];  Service: Vascular;  Laterality: Right;   ENTEROSCOPY N/A 12/27/2015   Procedure: ENTEROSCOPY;  Surgeon: Clarene Essex, MD;  Location: [facility];  Service: Endoscopy;  Laterality: N/A;  also needs slim egd scope   ENTEROSCOPY N/A 05/15/2019   Procedure: ENTEROSCOPY;  Surgeon: Ronald Lobo, MD;  Location: [place];  Service: Endoscopy;  Laterality: N/A;  Push enteroscopy using either ultraslim colonoscope or push enteroscope   ENTEROSCOPY N/A 08/08/2019    Procedure: ENTEROSCOPY;  Surgeon: Ronnette Juniper, MD;  Location: [place];  Service: Gastroenterology;  Laterality: N/A;   ENTEROSCOPY N/A 03/22/2022   Procedure: ENTEROSCOPY;  Surgeon: Arta Silence, MD;  Location: [place];  Service: Gastroenterology;  Laterality: N/A;   ESOPHAGOGASTRODUODENOSCOPY N/A 09/13/2013   Procedure: ESOPHAGOGASTRODUODENOSCOPY (EGD);  Surgeon: Lear Ng, MD;  [facility] END[facility]COPY;  Service: Endoscopy;  Laterality: N/A;   ESOPHAGOGASTRODUODENOSCOPY N/A 07/14/2015   Procedure: ESOPHAGOGASTRODUODENOSCOPY (EGD);  Surgeon: Clarene Essex, MD;  Location: [facility] ENDOSCOPY;  Service: Endoscopy;  Laterality: N/A;   ESOPHAGOGASTRODUODENOSCOPY (EGD) WITH PROPOFOL N/A 09/09/2020   Procedure: ESOPHAGOGASTRODUODENOSCOPY (EGD) WITH PROPOFOL;  Surgeon: Clarene Essex, MD;  Location: [place];  Service: Endoscopy;  Laterality: N/A;   FIDUCIAL MARKER PLACEMENT  06/06/2021   Procedure: FIDUCIAL MARKER PLACEMENT;  Surgeon: Garner Nash, DO;  Location: [place] ENDOSCOPY;  Service: Pulmonary;;   gi bleed  07/2015   blood given   GIVENS CAPSULE STUDY N/A 03/20/2022   Procedure: GIVENS CAPSULE STUDY;  Surgeon: Arta Silence, MD;  Location: [place];  Service: Gastroenterology;  Laterality: N/A;   HOT HEMOSTASIS N/A 12/27/2015   Procedure: HOT HEMOSTASIS (ARGON PLASMA COAGULATION/BICAP);  Surgeon: Clarene Essex, MD;  Location: [facility];  Service: Endoscopy;  Laterality: N/A;   HOT HEMOSTASIS N/A 08/08/2019   Procedure: HOT HEMOSTASIS (ARGON PLASMA COAGULATION/BICAP);  Surgeon: Ronnette Juniper, MD;  Location: [place];  Service: Gastroenterology;  Laterality: N/A;   HOT HEMOSTASIS N/A 09/09/2020   Procedure: HOT HEMOSTASIS (ARGON PLASMA COAGULATION/BICAP);  Surgeon: Clarene Essex, MD;  Location: [place];  Service: Endoscopy;  Laterality: N/A;   ILIAC ARTERY STENT Left 05/04/2013   L CIA-EIA -- Dr. Gwenlyn Found   LEFT HEART CATH AND CORONARY ANGIOGRAPHY N/A 02/11/2019   Procedure: LEFT  HEART CATH AND CORONARY ANGIOGRAPHY;  Surgeon: Leonie Man, MD;  Location: [place] CV LAB;;-  Patent RCA stent with minimal ISR after pRCA 50%).  Small caliber RPAV with ~75%.  pLAD 30%, m-dLAD 40%, D2 40%.  Normal LVEDP and LVEF.   LOWER EXTREMITY ANGIOGRAM Bilateral 05/04/2013   Procedure: LOWER EXTREMITY ANGIOGRAM;  Surgeon: Lorretta Harp, MD;  Location: [facility] CATH LAB;  Service: Cardiovascular;  Laterality: Bilateral;   NM MYOVIEW LTD  09/07/2015   EF > 65%. Nonspecific ST changes but no ischemic changes. No ischemia or infarction. LOW RISK   PATCH ANGIOPLASTY Left 07/28/2019   Procedure: PATCH ANGIOPLASTY USING Rueben Bash BIOLOGIC PATCH;  Surgeon: Waynetta Sandy, MD;  Location: [place];  Service: Vascular;  Laterality: Left;   RENAL ANGIOGRAPHY N/A 01/15/2022   Procedure: RENAL ANGIOGRAPHY;  Surgeon: Waynetta Sandy, MD;  Location: [place] CV LAB;  Service: Cardiovascular;  Laterality: N/A;   TONSILLECTOMY  1960's   TRANSTHORACIC ECHOCARDIOGRAM  05/14/2019   Normal EF 60 to 65%.  No R WMA.   Normal RV but mildly elevated pressures.  Relatively normal aortic and mitral valves.   UPPER GI ENDOSCOPY  12/14/2015   VIDEO BRONCHOSCOPY WITH  RADIAL ENDOBRONCHIAL ULTRASOUND  06/06/2021   Procedure: RADIAL ENDOBRONCHIAL ULTRASOUND;  Surgeon: Garner Nash, DO;  Location: [facility];  Service: Pulmonary;;    Allergies  No Known Allergies  History of Present Illness    Mrs. Harral comes today s/p hospitalization for acute anemia, requiring transfusions. Lengthy history of PAD, CKD s/p bilateral renal artery stents, carotid artery disease, PAD, HTN, and CAD. Plavix and BB were stopped on hospitalization.  Since coming back from the hospital the patient has been restarted on valsartan 40 mg daily, she and metoprolol has been restarted to 50 mg daily.  The patient states she is beginning to have the same symptoms of dyspnea on exertion, weakness, and dizziness over the last  few days.  She has been seen by her primary care provider and has had labs drawn on 12, 22, 2023 with a hemoglobin of 8.1.  This is down from 9.5, but higher than 6.5 on 03/19/2022.  The patient denies any frank bleeding.  She is being referred to [facility] GI department for further more invasive testing concerning occult bleeding, which has not performed in [place].  Home Medications    Current Outpatient Medications  Medication Sig Dispense Refill   acetaminophen (TYLENOL) 325 MG tablet Take 650 mg by mouth every 6 (six) hours as needed for mild pain or moderate pain.     albuterol (VENTOLIN HFA) 108 (90 Base) MCG/ACT inhaler Inhale 2 puffs into the lungs every 6 (six) hours as needed for wheezing or shortness of breath.     alendronate (FOSAMAX) 70 MG tablet Take 70 mg by mouth once a week.     aspirin EC 81 MG tablet Take 1 tablet (81 mg total) by mouth daily. 30 tablet    atorvastatin (LIPITOR) 80 MG tablet Take 1 tablet (80 mg total) by mouth daily. 90 tablet 3   ezetimibe (ZETIA) 10 MG tablet Take 1 tablet (10 mg total) by mouth daily. 90 tablet 3   ferrous gluconate (FERGON) 324 MG tablet Take 324 mg by mouth daily with breakfast.     metoprolol succinate (TOPROL-XL) 50 MG 24 hr tablet Take 1 tablet (50 mg total) by mouth at bedtime. 30 tablet 0   pantoprazole (PROTONIX) 40 MG tablet Take 1 tablet (40 mg total) by mouth 2 (two) times daily. 60 tablet 0   raloxifene (EVISTA) 60 MG tablet Take 60 mg by mouth daily.     No current facility-administered medications for this visit.     Family History    Family History  Problem Relation Age of Onset   Diabetes Mother    Heart disease Mother    Heart attack Mother 69   Breast cancer Mother 57   Colon cancer Mother 72   Heart attack Father 50   Pneumonia Father    Diabetes Sister    Breast cancer Sister    Breast cancer Sister 81   She indicated that her mother is deceased. She indicated that her father is  deceased.  Social History    Social History   Socioeconomic History   Marital status: Married    Spouse name: Not on file   Number of children: 1   Years of education: 12   Highest education level: High school graduate  Occupational History    Employer: RETIRED  Tobacco Use   Smoking status: Some Days    Types: Cigarettes    Last attempt to quit: 05/04/2013    Years since quitting: 8.9  Passive exposure: Never   Smokeless tobacco: Never   Tobacco comments:    She initially quit, but has gone back to smoking a couple cigarettes every now and then.    02/16/2022 Patient states "I smoke 1-2 about every two to three days.   Vaping Use   Vaping Use: Never used  Substance and Sexual Activity   Alcohol use: Yes    Comment: occasionally 3 times a year   Drug use: No   Sexual activity: Not Currently    Partners: Male    Birth control/protection: None, Post-menopausal  Other Topics Concern   Not on file  Social History Narrative   Married. Has one child. Grandmother for a great-grandmother and 1.   No real exercise.   Occasional alcohol consumption.   Quit smoking inf Feb 2015 -- after L Iliac Stent   Right-handed   Caffeine: 1 cup every morning   Right-handed.   Lives at home with husband.   Social Determinants of Health   Financial Resource Strain: Not on file  Food Insecurity: No Food Insecurity (03/20/2022)   Hunger Vital Sign    Worried About Running Out of Food in the Last Year: Never true    Ran Out of Food in the Last Year: Never true  Transportation Needs: No Transportation Needs (03/20/2022)   PRAPARE - Hydrologist (Medical): No    Lack of Transportation (Non-Medical): No  Physical Activity: Not on file  Stress: Not on file  Social Connections: Not on file  Intimate Partner Violence: Not At Risk (03/20/2022)   Humiliation, Afraid, Rape, and Kick questionnaire    Fear of Current or Ex-Partner: No    Emotionally Abused: No     Physically Abused: No    Sexually Abused: No     Review of Systems    General:  No chills, fever, night sweats or weight changes.  Generalized fatigue Cardiovascular: Mild chest pressure but no chest pain, worsening dyspnea on exertion, edema, orthopnea, palpitations, paroxysmal nocturnal dyspnea. Dermatological: No rash, lesions/masses Respiratory: No cough, worsening dyspnea. Urologic: No hematuria, dysuria Abdominal:   No nausea, vomiting, diarrhea, bright red blood per rectum, melena, or hematemesis Neurologic:  No visual changes, wkns, changes in mental status. All other systems reviewed and are otherwise negative except as noted above.     Physical Exam    VS:  BP (!) 104/55   Pulse 85   Ht '4\' 11"'$  (1.499 m)   Wt 117 lb (53.1 kg)   SpO2 95%   BMI 23.63 kg/m  , BMI Body mass index is 23.63 kg/m.     GEN: Well nourished, well developed, in no acute distress. HEENT: normal. Neck: Supple, no JVD, carotid bruits, or masses. Cardiac: RRR, no murmurs, rubs, or gallops. No clubbing, cyanosis, edema.  Radials/DP/PT 2+ and equal bilaterally.  Respiratory:  Respirations regular and unlabored, bilateral crackles, cleared with cough no wheezing.   GI: Soft, nontender, nondistended, BS + x 4. MS: no deformity or atrophy. Skin: warm and dry, no rash. Neuro:  Strength and sensation are intact. Psych: Normal affect.  Accessory Clinical Findings    ECG personally reviewed by me today; not completed this office visit today  Lab Results  Component Value Date   WBC 6.3 03/23/2022   HGB 8.1 (L) 03/23/2022   HCT 24.4 (L) 03/23/2022   MCV 89.1 03/23/2022   PLT 161 03/23/2022   Lab Results  Component Value Date   CREATININE 1.21 (H)  03/23/2022   BUN 19 03/23/2022   NA 139 03/23/2022   K 4.2 03/23/2022   CL 107 03/23/2022   CO2 27 03/23/2022   Lab Results  Component Value Date   ALT 9 03/21/2022   AST 19 03/21/2022   ALKPHOS 38 03/21/2022   BILITOT 0.8 03/21/2022   Lab  Results  Component Value Date   CHOL 91 05/15/2019   HDL 45 05/15/2019   LDLCALC 35 05/15/2019   TRIG 55 05/15/2019   CHOLHDL 2.0 05/15/2019    Lab Results  Component Value Date   HGBA1C 6.4 (H) 05/15/2019    Review of Prior Studies: Renal Angiography 01/15/2022 Aorta did not have any severe flow limitation. There is a left common iliac artery stent which is patent. Right renal artery had 70% stenosis reduced to 0% and left renal artery had 95% stenosis reduced to 0% after stenting.   NM Stress Test 22/05/2021  Findings are consistent with no prior ischemia. The study is low risk.   A pharmacological stress test was performed using IV Lexiscan 0.'4mg'$  over 10 seconds performed without concurrent submaximal exercise. Hypotensive blood pressure response noted during stress.   No ST deviation was noted.   Left ventricular function is normal. Nuclear stress EF: 77 %. The left ventricular ejection fraction is hyperdynamic (>65%). Small LV cavity sizes at both end systole and end diastole.   Prior study not available for comparison.  Cardiac Monitor 02/16/2022 14-day Zio patch October 24-February 06, 2022   Predominant underlying Rhythm: Sinus.  HR range 50-120 bpm, avg 67 bpm.   Occasional(1.2%) PACs with rare couplets and triplets.  No PVCs.   1 patient triggered event and sinus rhythm.   188 Atrial Runs: Fastest Run was 14 beats wiht HR range 114-169 bpm (avg 149 bpm, 5.9 sec); Longests Run was 18 beats (9.3 sec) with HR range 86-150 bpm & avg 117 bpm.   No Sustained Arrhythmias: Atrial Tachycardia (AT), Supraventricular Tachycardia (SVT), Atrial Fibrillation (A-Fib), Atrial Flutter (A-Flutter), Sustained Ventricular Tachycardia (VT)  Echocardiogram 01/08/2022 1. Left ventricular ejection fraction, by estimation, is 60 to 65%. The  left ventricle has normal function. The left ventricle has no regional  wall motion abnormalities. Left ventricular diastolic parameters are  consistent  with Grade II diastolic  dysfunction (pseudonormalization).   2. Right ventricular systolic function is mildly reduced. The right  ventricular size is mildly enlarged.   3. Right atrial size was moderately dilated.   4. The mitral valve is normal in structure. Trivial mitral valve  regurgitation. No evidence of mitral stenosis.   5. Tricuspid valve regurgitation is moderate to severe.   6. The aortic valve is tricuspid. There is moderate thickening of the  aortic valve. Aortic valve regurgitation is mild to moderate. No aortic  stenosis is present.   7. The inferior vena cava is normal in size with greater than 50%  respiratory variability, suggesting right atrial pressure of 3 mmHg.  Assessment & Plan   1.  Chronic anemia: Uncertain etiology.  GI working up, she was referred to [facility] GI clinic where further procedures will be completed which are not available in [place].  She denies any frank bleeding.  Hemoglobin has dropped since recent hospitalization from 9.5-8.1 on 03/23/2022.  Will repeat stat CBC to evaluate further deterioration of values.  Especially in light of her recurrent symptoms of shortness of breath and chest pressure.  This will be done stat.  If substantial decrease, would refer her to the hospital  for blood transfusion, otherwise she will follow-up with her PCP.  Do recommend referral to hematology in the setting of thrombocytopenia.  Patient is to make follow-up appointment with PCP.  2. Hypotension: Blood pressures have been checked here in the office and she is not orthostatic by blood pressures ranging from 100/56 to 103/55 with highest at 116/65 after standing for 3 minutes.  Heart rate was essentially unchanged.  I will discontinue losartan 40 mg daily and leave her on the metoprolol for now to avoid further hypotension if she is in fact bleeding or becoming more anemic.  3.   Coronary artery disease: History of PCI to the right coronary artery in June 2017.  No longer  on dual antiplatelet therapy but does remain on aspirin 81 mg daily.  No hemoptysis or melena is noted.  Will keep her on aspirin until otherwise recommended after GI evaluation.  Important to keep hemoglobin is steady state in the setting of CAD.  Follow-up CBC is being completed.  4.  Chronic kidney disease stage III: May be contributing to chronic anemia.  Do recommend follow-up with hematology for further evaluation and treatment recommendations.    Current medicines are reviewed at length with the patient today.  I have spent 25 min's  dedicated to the care of this patient on the date of this encounter to include pre-visit review of records, assessment, management and diagnostic testing,with shared decision making. Signed, Phill Myron. West Pugh, ANP, AACC   04/13/2022 11:19 AM      Office [phone] Fax [phone]  Notice: This dictation was prepared with Dragon dictation along with smaller phrase technology. Any transcriptional errors that result from this process are unintentional and may not be corrected upon review.

## 2022-04-13 ENCOUNTER — Ambulatory Visit: Payer: Medicare Other | Attending: Adult Health | Admitting: Adult Health

## 2022-04-13 ENCOUNTER — Encounter: Payer: Self-pay | Admitting: Adult Health

## 2022-04-13 VITALS — BP 104/55 | HR 85 | Ht 59.0 in | Wt 117.0 lb

## 2022-04-13 DIAGNOSIS — I2510 Atherosclerotic heart disease of native coronary artery without angina pectoris: Secondary | ICD-10-CM

## 2022-04-13 DIAGNOSIS — Z9861 Coronary angioplasty status: Secondary | ICD-10-CM

## 2022-04-13 DIAGNOSIS — D649 Anemia, unspecified: Secondary | ICD-10-CM | POA: Diagnosis not present

## 2022-04-13 DIAGNOSIS — R0609 Other forms of dyspnea: Secondary | ICD-10-CM

## 2022-04-13 DIAGNOSIS — I10 Essential (primary) hypertension: Secondary | ICD-10-CM

## 2022-04-13 LAB — CBC
Hematocrit: 27.2 % — ABNORMAL LOW (ref 34.0–46.6)
Hemoglobin: 8.9 g/dL — ABNORMAL LOW (ref 11.1–15.9)
MCH: 28.6 pg (ref 26.6–33.0)
MCHC: 32.7 g/dL (ref 31.5–35.7)
MCV: 88 fL (ref 79–97)
Platelets: 222 10*3/uL (ref 150–450)
RBC: 3.11 x10E6/uL — ABNORMAL LOW (ref 3.77–5.28)
RDW: 18.2 % — ABNORMAL HIGH (ref 11.7–15.4)
WBC: 6.7 10*3/uL (ref 3.4–10.8)

## 2022-04-13 NOTE — Patient Instructions (Signed)
Medication Instructions:  Stop Valsartan. *If you need a refill on your cardiac medications before your next appointment, please call your pharmacy*   Lab Work: CBC Today If you have labs (blood work) drawn today and your tests are completely normal, you will receive your results only by: [place] (if you have MyChart) OR A paper copy in the mail If you have any lab test that is abnormal or we need to change your treatment, we will call you to review the results.   Testing/Procedures: Today   Follow-Up: At [facility], you and your health needs are our priority.  As part of our continuing mission to provide you with exceptional heart care, we have created designated Provider Care Teams.  These Care Teams include your primary Cardiologist (physician) and Advanced Practice Providers (APPs -  Physician Assistants and Nurse Practitioners) who all work together to provide you with the care you need, when you need it.  We recommend signing up for the patient portal called "MyChart".  Sign up information is provided on this After Visit Summary.  MyChart is used to connect with patients for Virtual Visits (Telemedicine).  Patients are able to view lab/test results, encounter notes, upcoming appointments, etc.  Non-urgent messages can be sent to your provider as well.   To learn more about what you can do with MyChart, go to [email or web address].    Your next appointment:   6 week(s)  Provider:   Jory Sims, DNP, ANP      Other Instructions Recommended  Schedule Appointment with Primary Care Provider.

## 2022-04-20 ENCOUNTER — Telehealth: Payer: Self-pay

## 2022-04-20 NOTE — Telephone Encounter (Addendum)
Patient call by K. Lawrence regarding results.----- Message from Lendon Colonel, NP sent at 04/13/2022  1:33 PM EST ----- Called the patient with results. She verbalized understanding.

## 2022-05-01 ENCOUNTER — Ambulatory Visit
Admission: RE | Admit: 2022-05-01 | Discharge: 2022-05-01 | Disposition: A | Discharge status: Home / Self Care | Payer: Medicare Other | Source: Ambulatory Visit | Attending: Acute Care | Admitting: Acute Care

## 2022-05-01 DIAGNOSIS — J479 Bronchiectasis, uncomplicated: Secondary | ICD-10-CM | POA: Diagnosis not present

## 2022-05-01 DIAGNOSIS — I700 Atherosclerosis of aorta: Secondary | ICD-10-CM | POA: Diagnosis not present

## 2022-05-01 DIAGNOSIS — R911 Solitary pulmonary nodule: Secondary | ICD-10-CM

## 2022-05-01 DIAGNOSIS — R918 Other nonspecific abnormal finding of lung field: Secondary | ICD-10-CM | POA: Diagnosis not present

## 2022-05-16 ENCOUNTER — Telehealth: Payer: Self-pay | Admitting: Acute Care

## 2022-05-16 ENCOUNTER — Telehealth: Payer: Self-pay | Admitting: Cardiology

## 2022-05-16 DIAGNOSIS — R911 Solitary pulmonary nodule: Secondary | ICD-10-CM

## 2022-05-16 DIAGNOSIS — Z87891 Personal history of nicotine dependence: Secondary | ICD-10-CM

## 2022-05-16 NOTE — Telephone Encounter (Signed)
*  STAT* If patient is at the pharmacy, call can be transferred to refill team.   1. Which medications need to be refilled? (please list name of each medication and dose if known) metoprolol succinate (TOPROL-XL) 50 MG 24 hr tablet (Expired)   2. Which pharmacy/location (including street and city if local pharmacy) is medication to be sent to? [facility] - [place], [place] - [place] [address]   3. Do they need a 30 day or 90 day supply? [place]

## 2022-05-16 NOTE — Telephone Encounter (Signed)
I have called the patient with the results of her Ct Chest. I explained that she does have afew nodules in the right upper lobe are increased in size when compared with prior exam and new when compared with September 15, 2021 prior. Possibly due to worsened atypical infection, although primary lung neoplasm is also a consideration. Consider further evaluation with tissue sampling. I explained that I have spoken with Dr. Valeta Harms. She tends to have waxing and waning pulmonary nodules. Plan will be for a 6 month follow up scan to re-evaluate these nodules. Patient is in agreement with this plan.  Langley Gauss, please order a 6 month LDCT follow up scan , due end of July/ August 2024, and fax results to PCP. Let them know plan is for a 6 month follow up.  Thanks so much

## 2022-05-17 NOTE — Telephone Encounter (Signed)
Spoke with Eric Form NP and she would like to order a CT Chest LCS Nodule f/u Ct for 6 months. Order has been placed and CT results were faxed to PCP.

## 2022-05-17 NOTE — Telephone Encounter (Signed)
Judson Roch, do you want the 6 month follow up CT to be ordered as a Chest CT w/o contrast or a CT Chest LCS Nodule f/u CT?

## 2022-05-18 ENCOUNTER — Telehealth: Payer: Self-pay | Admitting: Cardiology

## 2022-05-18 NOTE — Telephone Encounter (Signed)
*  STAT* If patient is at the pharmacy, call can be transferred to refill team.   1. Which medications need to be refilled? (please list name of each medication and dose if known)   metoprolol succinate (TOPROL-XL) 50 MG 24 hr tablet (Expired)   2. Which pharmacy/location (including street and city if local pharmacy) is medication to be sent to?  [facility] - [place], [place] - [place] [address]   3. Do they need a 30 day or 90 day supply?  30 day  Caller stated patient is completely out of this medication.

## 2022-05-21 ENCOUNTER — Other Ambulatory Visit: Payer: Self-pay

## 2022-05-21 DIAGNOSIS — M109 Gout, unspecified: Secondary | ICD-10-CM | POA: Diagnosis not present

## 2022-05-21 MED ORDER — METOPROLOL SUCCINATE ER 50 MG PO TB24: 50.0000 mg | ORAL_TABLET | Freq: Every day | ORAL | 3 refills | Status: DC

## 2022-05-21 NOTE — Telephone Encounter (Signed)
Refills has been sent to the pharmacy. 

## 2022-05-22 DIAGNOSIS — I10 Essential (primary) hypertension: Secondary | ICD-10-CM | POA: Diagnosis not present

## 2022-05-22 DIAGNOSIS — E785 Hyperlipidemia, unspecified: Secondary | ICD-10-CM | POA: Diagnosis not present

## 2022-05-22 DIAGNOSIS — K219 Gastro-esophageal reflux disease without esophagitis: Secondary | ICD-10-CM | POA: Diagnosis not present

## 2022-05-22 DIAGNOSIS — J4530 Mild persistent asthma, uncomplicated: Secondary | ICD-10-CM | POA: Diagnosis not present

## 2022-05-22 DIAGNOSIS — I2510 Atherosclerotic heart disease of native coronary artery without angina pectoris: Secondary | ICD-10-CM | POA: Diagnosis not present

## 2022-05-22 NOTE — Progress Notes (Signed)
E Cardiology Clinic Note    Patient Name: Kayla Mcclure Date of Encounter: 04/13/2022   Primary Care Provider:  Lois Huxley, PA Primary Cardiologist:  [name], MD   Patient Profile    77 year old female with history of CAD s/p PCI of RCA with Promus DES 2023 with some distal vessel disease that seem to be relatively small caliber;chronic PAD with PTCA to the L Com and External Iliac 205 by Dr.Berry, Carotid Artery Disease with endarterectomy on the right with patch angioplasty by Dr. Donzetta Matters 08/20/21, endarterectomy on the left 07/28/2019; chronic kidney disease s/p bilateral renal stents, chronic DOE, HTN, near syncope. Recent hospitalization for acute blood loss anemia,and chest pain symptoms.Hgb 6.5.GI consultation with enteroscopy revealing erosive gastropathy, without stigmata of bleeding, no bleeding identified, being followed by [facility] Dr Irven [place], MD        Past Medical History    Past Medical History:  Diagnosis Date   Acute GI bleeding 08/07/2019   Asthma 10/31/2007   CAD (coronary artery disease) 08/05/2007   a) NSTEMI 2009: PCI to the RCA Promus DES 2.5 mm x 23 mm (3.0 mm). b) Myoview June 2017: Nonspecific ST changes. EF greater than 65%. LOW RISK, normal study. No ischemia or infarction.   Carotid artery disease [facility])    s/p Bilateral CEA March & May 2020.    Chronic back pain    Chronic diastolic heart failure (HCC)    CKD (chronic kidney disease), stage II    CKD (chronic kidney disease), stage III ([place])    Diverticulosis 04/11/2010   Colonoscopy   Dyslipidemia, goal LDL below 70    Essential hypertension    a. Normal renal arteries by PV angio 05/2013.   GERD (gastroesophageal reflux disease)    GI bleed 12/2015   History of kidney stones 1975   surgery to remove   History of recent blood transfusion 12/2015   12-19-15, 12-20-15 and 12-21-15   Insomnia    Internal hemorrhoids 04/11/2010   By colonoscopy   Iron deficiency anemia    Lung  nodules 05/2021   bilateral   Myocardial infarction [facility]) 2009   Peripheral arterial disease ([place])    a. s/p PTA and stenting of L CIA stenosis 05/2013.   Renal insufficiency    Stroke [facility])    mini stroke   Tobacco abuse    Quit in 05/2013   Past Surgical History:  Procedure Laterality Date   BRONCHIAL BIOPSY  06/06/2021   Procedure: BRONCHIAL BIOPSIES;  Surgeon: Garner Nash, DO;  Location: [place] ENDOSCOPY;  Service: Pulmonary;;   BRONCHIAL BRUSHINGS  06/06/2021   Procedure: BRONCHIAL BRUSHINGS;  Surgeon: Garner Nash, DO;  Location: [place] [facility];  Service: Pulmonary;;   BRONCHIAL NEEDLE ASPIRATION BIOPSY  06/06/2021   Procedure: BRONCHIAL NEEDLE ASPIRATION BIOPSIES;  Surgeon: Garner Nash, DO;  Location: [place] [facility];  Service: Pulmonary;;   CAPSULE ENDOSCOPY  10/27/2019   [facility]) evidence of "pinhole leaking" in both stomach and proximal small bowel; no overt bleeding noted   CHOLECYSTECTOMY  1975   COLONOSCOPY  04/11/2010   COLONOSCOPY  11/30/2015   CORONARY ANGIOPLASTY WITH STENT PLACEMENT  08/05/2007   Promus DES 2.5 mm x 23 mm (3 mm)  to RCA; EF 60-70%   ENDARTERECTOMY Left 07/28/2019   Procedure: ENDARTERECTOMY CAROTID;  Surgeon: Waynetta Sandy, MD;  Location: [place];  Service: Vascular;  Laterality: Left;   ENDARTERECTOMY Right 08/27/2019   Procedure: ENDARTERECTOMY CAROTID RIGHT  WITH PATCH ANGIOPLASTY;  Surgeon: Waynetta Sandy, MD;  Location: [place];  Service: Vascular;  Laterality: Right;   ENTEROSCOPY N/A 12/27/2015   Procedure: ENTEROSCOPY;  Surgeon: Clarene Essex, MD;  Location: [facility];  Service: Endoscopy;  Laterality: N/A;  also needs slim egd scope   ENTEROSCOPY N/A 05/15/2019   Procedure: ENTEROSCOPY;  Surgeon: Ronald Lobo, MD;  Location: [place];  Service: Endoscopy;  Laterality: N/A;  Push enteroscopy using either ultraslim colonoscope or push enteroscope   ENTEROSCOPY N/A 08/08/2019   Procedure: ENTEROSCOPY;  Surgeon: Ronnette Juniper,  MD;  Location: [place];  Service: Gastroenterology;  Laterality: N/A;   ENTEROSCOPY N/A 03/22/2022   Procedure: ENTEROSCOPY;  Surgeon: Arta Silence, MD;  Location: [place];  Service: Gastroenterology;  Laterality: N/A;   ESOPHAGOGASTRODUODENOSCOPY N/A 09/13/2013   Procedure: ESOPHAGOGASTRODUODENOSCOPY (EGD);  Surgeon: Lear Ng, MD;  Location: [facility] ENDOSCOPY;  Service: Endoscopy;  Laterality: N/A;   ESOPHAGOGASTRODUODENOSCOPY N/A 07/14/2015   Procedure: ESOPHAGOGASTRODUODENOSCOPY (EGD);  Surgeon: Clarene Essex, MD;  Location: [facility] ENDOSCOPY;  Service: Endoscopy;  Laterality: N/A;   ESOPHAGOGASTRODUODENOSCOPY (EGD) WITH PROPOFOL N/A 09/09/2020   Procedure: ESOPHAGOGASTRODUODENOSCOPY (EGD) WITH PROPOFOL;  Surgeon: Clarene Essex, MD;  Location: [place];  Service: Endoscopy;  Laterality: N/A;   FIDUCIAL MARKER PLACEMENT  06/06/2021   Procedure: FIDUCIAL MARKER PLACEMENT;  Surgeon: Garner Nash, DO;  Location: [place] ENDOSCOPY;  Service: Pulmonary;;   gi bleed  07/2015   blood given   GIVENS CAPSULE STUDY N/A 03/20/2022   Procedure: GIVENS CAPSULE STUDY;  Surgeon: Arta Silence, MD;  Location: [place];  Service: Gastroenterology;  Laterality: N/A;   HOT HEMOSTASIS N/A 12/27/2015   Procedure: HOT HEMOSTASIS (ARGON PLASMA COAGULATION/BICAP);  Surgeon: Clarene Essex, MD;  Location: [facility];  Service: Endoscopy;  Laterality: N/A;   HOT HEMOSTASIS N/A 08/08/2019   Procedure: HOT HEMOSTASIS (ARGON PLASMA COAGULATION/BICAP);  Surgeon: Ronnette Juniper, MD;  Location: [place];  Service: Gastroenterology;  Laterality: N/A;   HOT HEMOSTASIS N/A 09/09/2020   Procedure: HOT HEMOSTASIS (ARGON PLASMA COAGULATION/BICAP);  Surgeon: Clarene Essex, MD;  Location: [place];  Service: Endoscopy;  Laterality: N/A;   ILIAC ARTERY STENT Left 05/04/2013   L CIA-EIA -- Dr. Gwenlyn Found   LEFT HEART CATH AND CORONARY ANGIOGRAPHY N/A 02/11/2019   Procedure: LEFT HEART CATH AND CORONARY ANGIOGRAPHY;  Surgeon:  Leonie Man, MD;  Location: [place] CV LAB;;-  Patent RCA stent with minimal ISR after pRCA 50%).  Small caliber RPAV with ~75%.  pLAD 30%, m-dLAD 40%, D2 40%.  Normal LVEDP and LVEF.   LOWER EXTREMITY ANGIOGRAM Bilateral 05/04/2013   Procedure: LOWER EXTREMITY ANGIOGRAM;  Surgeon: Lorretta Harp, MD;  [facility] LAB;  Service: Cardiovascular;  Laterality: Bilateral;   NM MYOVIEW LTD  09/07/2015   EF > 65%. Nonspecific ST changes but no ischemic changes. No ischemia or infarction. LOW RISK   PATCH ANGIOPLASTY Left 07/28/2019   Procedure: PATCH ANGIOPLASTY USING Rueben Bash BIOLOGIC PATCH;  Surgeon: Waynetta Sandy, MD;  Location: [place];  Service: Vascular;  Laterality: Left;   RENAL ANGIOGRAPHY N/A 01/15/2022   Procedure: RENAL ANGIOGRAPHY;  Surgeon: Waynetta Sandy, MD;  Location: [facility];  Service: Cardiovascular;  Laterality: N/A;   TONSILLECTOMY  1960's   TRANSTHORACIC ECHOCARDIOGRAM  05/14/2019   Normal EF 60 to 65%.  No R WMA.   Normal RV but mildly elevated pressures.  Relatively normal aortic and mitral valves.   UPPER GI ENDOSCOPY  12/14/2015   VIDEO  BRONCHOSCOPY WITH RADIAL ENDOBRONCHIAL ULTRASOUND  06/06/2021   Procedure: RADIAL ENDOBRONCHIAL ULTRASOUND;  Surgeon: Garner Nash, DO;  Location: [facility]OS[facility];  Service: Pulmonary;;    Allergies  No Known Allergies  History of Present Illness    Mrs. Wujcik comes today for ongoing assessment and management of CAD, s/p PCI of RCA with Promus DES 2023 with some distal vessel disease.PAD with PTCA to the L Com and External Iliac 2009 by Dr.Berry, followed by Dr Donzetta Matters for carotid artery disease s/p left endarterectomy, RAS s/p bilateral renal stents.  Hx of anemia being followed at [facility].  Her main complaint today is gout pain of the left great toe that has been ongoing for 2 weeks.  She is gone by primary care and had lab work drawn but has been having a great deal of discomfort.  She fell once  because she could not bear weight.  She also has been out of metoprolol for 2 weeks.  She receives this from [facility] and apparently the request was sent to her primary care provider who did not refill it advising her to follow-up with cardiology.  Despite all of this she has been without complaints of chest pain, shortness of breath, fatigue, or near syncope.  She has been really focused on her gout and the pain associated.  Home Medications    Current Outpatient Medications  Medication Sig Dispense Refill   acetaminophen (TYLENOL) 325 MG tablet Take 650 mg by mouth every 6 (six) hours as needed for mild pain or moderate pain.     albuterol (VENTOLIN HFA) 108 (90 Base) MCG/ACT inhaler Inhale 2 puffs into the lungs every 6 (six) hours as needed for wheezing or shortness of breath.     alendronate (FOSAMAX) 70 MG tablet Take 70 mg by mouth once a week.     aspirin EC 81 MG tablet Take 1 tablet (81 mg total) by mouth daily. 30 tablet    atorvastatin (LIPITOR) 80 MG tablet Take 1 tablet (80 mg total) by mouth daily. 90 tablet 3   ezetimibe (ZETIA) 10 MG tablet Take 1 tablet (10 mg total) by mouth daily. 90 tablet 3   ferrous gluconate (FERGON) 324 MG tablet Take 324 mg by mouth daily with breakfast.     metoprolol succinate (TOPROL-XL) 50 MG 24 hr tablet Take 1 tablet (50 mg total) by mouth at bedtime. 90 tablet 3   raloxifene (EVISTA) 60 MG tablet Take 60 mg by mouth daily.     pantoprazole (PROTONIX) 40 MG tablet Take 1 tablet (40 mg total) by mouth 2 (two) times daily. 60 tablet 0   No current facility-administered medications for this visit.     Family History    Family History  Problem Relation Age of Onset   Diabetes Mother    Heart disease Mother    Heart attack Mother 43   Breast cancer Mother 33   Colon cancer Mother 29   Heart attack Father 86   Pneumonia Father    Diabetes Sister    Breast cancer Sister    Breast cancer Sister 52   She indicated that her mother is  deceased. She indicated that her father is deceased.  Social History    Social History   Socioeconomic History   Marital status: Married    Spouse name: Not on file   Number of children: 1   Years of education: 12   Highest education level: High school graduate  Occupational History  Employer: RETIRED  Tobacco Use   Smoking status: Some Days    Types: Cigarettes    Last attempt to quit: 05/04/2013    Years since quitting: 9.0    Passive exposure: Never   Smokeless tobacco: Never   Tobacco comments:    She initially quit, but has gone back to smoking a couple cigarettes every now and then.    02/16/2022 Patient states "I smoke 1-2 about every two to three days.   Vaping Use   Vaping Use: Never used  Substance and Sexual Activity   Alcohol use: Yes    Comment: occasionally 3 times a year   Drug use: No   Sexual activity: Not Currently    Partners: Male    Birth control/protection: None, Post-menopausal  Other Topics Concern   Not on file  Social History Narrative   Married. Has one child. Grandmother for a great-grandmother and 1.   No real exercise.   Occasional alcohol consumption.   Quit smoking inf Feb 2015 -- after L Iliac Stent   Right-handed   Caffeine: 1 cup every morning   Right-handed.   Lives at home with husband.   Social Determinants of Health   Financial Resource Strain: Not on file  Food Insecurity: No Food Insecurity (03/20/2022)   Hunger Vital Sign    Worried About Running Out of Food in the Last Year: Never true    Ran Out of Food in the Last Year: Never true  Transportation Needs: No Transportation Needs (03/20/2022)   PRAPARE - Hydrologist (Medical): No    Lack of Transportation (Non-Medical): No  Physical Activity: Not on file  Stress: Not on file  Social Connections: Not on file  Intimate Partner Violence: Not At Risk (03/20/2022)   Humiliation, Afraid, Rape, and Kick questionnaire    Fear of Current or  Ex-Partner: No    Emotionally Abused: No    Physically Abused: No    Sexually Abused: No     Review of Systems    General:  No chills, fever, night sweats or weight changes.  Cardiovascular:  No chest pain, dyspnea on exertion, edema, orthopnea, palpitations, paroxysmal nocturnal dyspnea. Dermatological: No rash, lesions/masses Respiratory: No cough, dyspnea Urologic: No hematuria, dysuria Abdominal:   No nausea, vomiting, diarrhea, bright red blood per rectum, melena, or hematemesis Neurologic:  No visual changes, wkns, changes in mental status. All other systems reviewed and are otherwise negative except as noted above.     Physical Exam    VS:  BP (!) 148/62   Pulse 72   Ht '4\' 11"'$  (1.499 m)   Wt 117 lb 6.4 oz (53.3 kg)   SpO2 96%   BMI 23.71 kg/m  , BMI Body mass index is 23.71 kg/m.     GEN: Well nourished, well developed, in no acute distress. HEENT: normal. Neck: Supple, no JVD, carotid bruits, or masses. Cardiac: RRR, tachycardic, no murmurs, rubs, or gallops. No clubbing, cyanosis, edema.  Radials/DP/PT 2+ and equal bilaterally.  Respiratory:  Respirations regular and unlabored, clear to auscultation bilaterally. GI: Soft, nontender, nondistended, BS + x 4. MS: no deformity or atrophy.  (Did not examine left foot as patient did not want shoe removed due to pain) Skin: warm and dry, no rash. Neuro:  Strength and sensation are intact. Psych: Normal affect.  Accessory Clinical Findings    ECG personally reviewed by me today-not completed this office visit  Lab Results  Component Value Date  WBC 6.7 04/13/2022   HGB 8.9 (L) 04/13/2022   HCT 27.2 (L) 04/13/2022   MCV 88 04/13/2022   PLT 222 04/13/2022   Lab Results  Component Value Date   CREATININE 1.21 (H) 03/23/2022   BUN 19 03/23/2022   NA 139 03/23/2022   K 4.2 03/23/2022   CL 107 03/23/2022   CO2 27 03/23/2022   Lab Results  Component Value Date   ALT 9 03/21/2022   AST 19 03/21/2022    ALKPHOS 38 03/21/2022   BILITOT 0.8 03/21/2022   Lab Results  Component Value Date   CHOL 91 05/15/2019   HDL 45 05/15/2019   LDLCALC 35 05/15/2019   TRIG 55 05/15/2019   CHOLHDL 2.0 05/15/2019    Lab Results  Component Value Date   HGBA1C 6.4 (H) 05/15/2019    Review of Prior Studies:  Renal Angiography 01/15/2022 Aorta did not have any severe flow limitation. There is a left common iliac artery stent which is patent. Right renal artery had 70% stenosis reduced to 0% and left renal artery had 95% stenosis reduced to 0% after stenting.    NM Stress Test 22/05/2021  Findings are consistent with no prior ischemia. The study is low risk.   A pharmacological stress test was performed using IV Lexiscan 0.'4mg'$  over 10 seconds performed without concurrent submaximal exercise. Hypotensive blood pressure response noted during stress.   No ST deviation was noted.   Left ventricular function is normal. Nuclear stress EF: 77 %. The left ventricular ejection fraction is hyperdynamic (>65%). Small LV cavity sizes at both end systole and end diastole.   Prior study not available for comparison.   Cardiac Monitor 02/16/2022 14-day Zio patch October 24-February 06, 2022   Predominant underlying Rhythm: Sinus.  HR range 50-120 bpm, avg 67 bpm.   Occasional(1.2%) PACs with rare couplets and triplets.  No PVCs.   1 patient triggered event and sinus rhythm.   188 Atrial Runs: Fastest Run was 14 beats wiht HR range 114-169 bpm (avg 149 bpm, 5.9 sec); Longests Run was 18 beats (9.3 sec) with HR range 86-150 bpm & avg 117 bpm.   No Sustained Arrhythmias: Atrial Tachycardia (AT), Supraventricular Tachycardia (SVT), Atrial Fibrillation (A-Fib), Atrial Flutter (A-Flutter), Sustained Ventricular Tachycardia (VT)   Echocardiogram 01/08/2022 1. Left ventricular ejection fraction, by estimation, is 60 to 65%. The  left ventricle has normal function. The left ventricle has no regional  wall motion  abnormalities. Left ventricular diastolic parameters are  consistent with Grade II diastolic  dysfunction (pseudonormalization).   2. Right ventricular systolic function is mildly reduced. The right  ventricular size is mildly enlarged.   3. Right atrial size was moderately dilated.   4. The mitral valve is normal in structure. Trivial mitral valve  regurgitation. No evidence of mitral stenosis.   5. Tricuspid valve regurgitation is moderate to severe.   6. The aortic valve is tricuspid. There is moderate thickening of the  aortic valve. Aortic valve regurgitation is mild to moderate. No aortic  stenosis is present.   7. The inferior vena cava is normal in size with greater than 50%  respiratory variability, suggesting right atrial pressure of 3 mmHg.    Assessment & Plan   1.  Coronary artery disease: She is without chest pain but is hypertensive and tachycardic today.  She has been out of her metoprolol for 2 weeks.  Apparently she thought her pharmacy was going to follow-up and asked for refills.  New refills  are provided for her for 90-day supply on metoprolol succinate 50 mg at at bedtime.  Continue statin therapy with goal of of LDL less than 50 secondary to peripheral arterial disease in association with carotid artery disease and CAD.  2.  Hypertension: Elevated today due to running out of metoprolol.  Reordered.  Is due to see PCP on follow-up.  As she gets her medications from [facility], a 2-week supply locally for metoprolol has been provided.  3.  Carotid artery disease: Continue medical management and follow-up with Dr. Donzetta Matters.  No carotid bruits are heard during auscultation.  Secondary management as described.  4.  Hypercholesterolemia: Remains on atorvastatin 80 mg daily.  Denies any myalgia pain.  Goal LDL less than 50 with multiple areas of arterial disease.  5.  Acute gout flare: Has been suffering for 2 weeks.  Has been taking over-the-counter Tylenol and one of her sons  Tylenol with codeine which did help her find relief when she took that 1 tablet.  She has had lab work completed by primary care.  She continues to suffer with the pain and issues concerning her foot.  She did not allow me to examine it as the pain would worsen by removing her shoe.  I have advised her to see her PCP as a walk-in as they do take those appointments to examine her further.  Current medicines are reviewed at length with the patient today.  I have spent 25 min's  dedicated to the care of this patient on the date of this encounter to include pre-visit review of records, assessment, management and diagnostic testing,with shared decision making. Signed, Phill Myron. West Pugh, ANP, AACC   05/25/2022 8:12 AM      Office [phone] Fax [phone]  Notice: This dictation was prepared with Dragon dictation along with smaller phrase technology. Any transcriptional errors that result from this process are unintentional and may not be corrected upon review.

## 2022-05-25 ENCOUNTER — Ambulatory Visit: Payer: Medicare Other | Attending: Adult Health | Admitting: Adult Health

## 2022-05-25 ENCOUNTER — Encounter: Payer: Self-pay | Admitting: Adult Health

## 2022-05-25 VITALS — BP 148/62 | HR 72 | Ht 59.0 in | Wt 117.4 lb

## 2022-05-25 DIAGNOSIS — I10 Essential (primary) hypertension: Secondary | ICD-10-CM

## 2022-05-25 DIAGNOSIS — Z9861 Coronary angioplasty status: Secondary | ICD-10-CM | POA: Diagnosis not present

## 2022-05-25 DIAGNOSIS — I6523 Occlusion and stenosis of bilateral carotid arteries: Secondary | ICD-10-CM | POA: Diagnosis not present

## 2022-05-25 DIAGNOSIS — M10072 Idiopathic gout, left ankle and foot: Secondary | ICD-10-CM | POA: Diagnosis not present

## 2022-05-25 DIAGNOSIS — D508 Other iron deficiency anemias: Secondary | ICD-10-CM

## 2022-05-25 DIAGNOSIS — I2510 Atherosclerotic heart disease of native coronary artery without angina pectoris: Secondary | ICD-10-CM | POA: Diagnosis not present

## 2022-05-25 DIAGNOSIS — I739 Peripheral vascular disease, unspecified: Secondary | ICD-10-CM

## 2022-05-25 MED ORDER — METOPROLOL SUCCINATE ER 50 MG PO TB24: 50.0000 mg | ORAL_TABLET | Freq: Every day | ORAL | 3 refills | Status: DC

## 2022-05-25 MED ORDER — ALENDRONATE SODIUM 70 MG PO TABS: 70.0000 mg | ORAL_TABLET | ORAL | 0 refills | Status: DC

## 2022-05-25 NOTE — Patient Instructions (Signed)
Medication Instructions:  No Changes *If you need a refill on your cardiac medications before your next appointment, please call your pharmacy*   Lab Work: No Labs If you have labs (blood work) drawn today and your tests are completely normal, you will receive your results only by: [place] (if you have MyChart) OR A paper copy in the mail If you have any lab test that is abnormal or we need to change your treatment, we will call you to review the results.   Testing/Procedures: No Testing   Follow-Up: At [facility], you and your health needs are our priority.  As part of our continuing mission to provide you with exceptional heart care, we have created designated Provider Care Teams.  These Care Teams include your primary Cardiologist (physician) and Advanced Practice Providers (APPs -  Physician Assistants and Nurse Practitioners) who all work together to provide you with the care you need, when you need it.  We recommend signing up for the patient portal called "MyChart".  Sign up information is provided on this After Visit Summary.  MyChart is used to connect with patients for Virtual Visits (Telemedicine).  Patients are able to view lab/test results, encounter notes, upcoming appointments, etc.  Non-urgent messages can be sent to your provider as well.   To learn more about what you can do with MyChart, go to [email or web address].    Your next appointment:   Keep Scheduled Appointment  Provider:   [name], MD

## 2022-05-28 DIAGNOSIS — Z01818 Encounter for other preprocedural examination: Secondary | ICD-10-CM | POA: Diagnosis not present

## 2022-05-28 DIAGNOSIS — I252 Old myocardial infarction: Secondary | ICD-10-CM | POA: Diagnosis not present

## 2022-05-28 DIAGNOSIS — R933 Abnormal findings on diagnostic imaging of other parts of digestive tract: Secondary | ICD-10-CM | POA: Insufficient documentation

## 2022-05-28 DIAGNOSIS — D509 Iron deficiency anemia, unspecified: Secondary | ICD-10-CM | POA: Diagnosis not present

## 2022-05-28 DIAGNOSIS — I5032 Chronic diastolic (congestive) heart failure: Secondary | ICD-10-CM | POA: Diagnosis not present

## 2022-05-28 DIAGNOSIS — I2510 Atherosclerotic heart disease of native coronary artery without angina pectoris: Secondary | ICD-10-CM | POA: Diagnosis not present

## 2022-05-28 DIAGNOSIS — F17210 Nicotine dependence, cigarettes, uncomplicated: Secondary | ICD-10-CM | POA: Insufficient documentation

## 2022-05-28 DIAGNOSIS — Z9861 Coronary angioplasty status: Secondary | ICD-10-CM | POA: Diagnosis not present

## 2022-05-28 DIAGNOSIS — I10 Essential (primary) hypertension: Secondary | ICD-10-CM | POA: Diagnosis not present

## 2022-06-05 DIAGNOSIS — I252 Old myocardial infarction: Secondary | ICD-10-CM | POA: Diagnosis not present

## 2022-06-05 DIAGNOSIS — Z7982 Long term (current) use of aspirin: Secondary | ICD-10-CM | POA: Diagnosis not present

## 2022-06-05 DIAGNOSIS — N183 Chronic kidney disease, stage 3 unspecified: Secondary | ICD-10-CM | POA: Diagnosis not present

## 2022-06-05 DIAGNOSIS — K922 Gastrointestinal hemorrhage, unspecified: Secondary | ICD-10-CM | POA: Diagnosis not present

## 2022-06-05 DIAGNOSIS — D509 Iron deficiency anemia, unspecified: Secondary | ICD-10-CM | POA: Diagnosis not present

## 2022-06-05 DIAGNOSIS — I6521 Occlusion and stenosis of right carotid artery: Secondary | ICD-10-CM | POA: Diagnosis not present

## 2022-06-05 DIAGNOSIS — I2510 Atherosclerotic heart disease of native coronary artery without angina pectoris: Secondary | ICD-10-CM | POA: Diagnosis not present

## 2022-06-05 DIAGNOSIS — I130 Hypertensive heart and chronic kidney disease with heart failure and stage 1 through stage 4 chronic kidney disease, or unspecified chronic kidney disease: Secondary | ICD-10-CM | POA: Diagnosis not present

## 2022-06-05 DIAGNOSIS — D631 Anemia in chronic kidney disease: Secondary | ICD-10-CM | POA: Diagnosis not present

## 2022-06-05 DIAGNOSIS — F17210 Nicotine dependence, cigarettes, uncomplicated: Secondary | ICD-10-CM | POA: Diagnosis not present

## 2022-06-05 DIAGNOSIS — R933 Abnormal findings on diagnostic imaging of other parts of digestive tract: Secondary | ICD-10-CM | POA: Diagnosis not present

## 2022-06-05 DIAGNOSIS — I701 Atherosclerosis of renal artery: Secondary | ICD-10-CM | POA: Diagnosis not present

## 2022-06-05 DIAGNOSIS — Z79899 Other long term (current) drug therapy: Secondary | ICD-10-CM | POA: Diagnosis not present

## 2022-06-05 DIAGNOSIS — K5520 Angiodysplasia of colon without hemorrhage: Secondary | ICD-10-CM | POA: Diagnosis not present

## 2022-06-05 DIAGNOSIS — I5032 Chronic diastolic (congestive) heart failure: Secondary | ICD-10-CM | POA: Diagnosis not present

## 2022-06-15 ENCOUNTER — Telehealth: Payer: Self-pay | Admitting: Cardiology

## 2022-06-15 DIAGNOSIS — M109 Gout, unspecified: Secondary | ICD-10-CM | POA: Diagnosis not present

## 2022-06-15 NOTE — Telephone Encounter (Signed)
Pt c/o medication issue:  1. Name of Medication:  Amlodipine Vaslartan Isosorbide furosemide  2. How are you currently taking this medication (dosage and times per day)? Need to verify if patient is supposed to take them  3. Are you having a reaction (difficulty breathing--STAT)? no  4. What is your medication issue? [name] with [facility] calling to verify whether the patient is supposed to continue these medications. She says the last office note they have does not list them, but the patient states she is still taking them. Phone: [phone]

## 2022-06-17 ENCOUNTER — Other Ambulatory Visit: Payer: Self-pay | Admitting: Adult Health

## 2022-06-18 NOTE — Telephone Encounter (Signed)
Left message for Kayla Mcclure to call back  Amlodipine, imdur, and lasix discontinued at discharge from hospital 12/22.  Valsartan discontinued at OV 1/12 d/t low BP.

## 2022-06-18 NOTE — Telephone Encounter (Signed)
[name] with Kayla Mcclure is returning call and is requesting return call.

## 2022-06-18 NOTE — Telephone Encounter (Signed)
Spoke to [place] at [facility] of current medications.

## 2022-06-22 DIAGNOSIS — I5032 Chronic diastolic (congestive) heart failure: Secondary | ICD-10-CM | POA: Diagnosis not present

## 2022-06-22 DIAGNOSIS — E785 Hyperlipidemia, unspecified: Secondary | ICD-10-CM | POA: Diagnosis not present

## 2022-06-22 DIAGNOSIS — I10 Essential (primary) hypertension: Secondary | ICD-10-CM | POA: Diagnosis not present

## 2022-06-22 DIAGNOSIS — J4530 Mild persistent asthma, uncomplicated: Secondary | ICD-10-CM | POA: Diagnosis not present

## 2022-06-22 DIAGNOSIS — I2510 Atherosclerotic heart disease of native coronary artery without angina pectoris: Secondary | ICD-10-CM | POA: Diagnosis not present

## 2022-06-22 DIAGNOSIS — K219 Gastro-esophageal reflux disease without esophagitis: Secondary | ICD-10-CM | POA: Diagnosis not present

## 2022-07-02 ENCOUNTER — Other Ambulatory Visit: Payer: Self-pay | Admitting: Family Medicine

## 2022-07-02 DIAGNOSIS — Z1231 Encounter for screening mammogram for malignant neoplasm of breast: Secondary | ICD-10-CM

## 2022-07-03 ENCOUNTER — Ambulatory Visit
Admission: RE | Admit: 2022-07-03 | Discharge: 2022-07-03 | Disposition: A | Discharge status: Home / Self Care | Payer: Medicare Other | Source: Ambulatory Visit | Attending: Family Medicine | Admitting: Family Medicine

## 2022-07-03 ENCOUNTER — Telehealth: Payer: Self-pay | Admitting: Cardiology

## 2022-07-03 DIAGNOSIS — Z1231 Encounter for screening mammogram for malignant neoplasm of breast: Secondary | ICD-10-CM | POA: Diagnosis not present

## 2022-07-03 MED ORDER — METOPROLOL SUCCINATE ER 50 MG PO TB24: 50.0000 mg | ORAL_TABLET | Freq: Every day | ORAL | 3 refills | Status: AC

## 2022-07-03 NOTE — Telephone Encounter (Signed)
 *  STAT* If patient is at the pharmacy, call can be transferred to refill team.   1. Which medications need to be refilled? (please list name of each medication and dose if known) metoprolol succinate (TOPROL-XL) 50 MG 24 hr tablet   2. Which pharmacy/location (including street and city if local pharmacy) is medication to be sent to? [facility] ([place], [place]   3. Do they need a 30 day or 90 day supply? 90 days

## 2022-07-03 NOTE — Telephone Encounter (Signed)
Refill sent.

## 2022-07-26 ENCOUNTER — Other Ambulatory Visit: Payer: Self-pay | Admitting: *Deleted

## 2022-07-26 DIAGNOSIS — I6523 Occlusion and stenosis of bilateral carotid arteries: Secondary | ICD-10-CM

## 2022-08-01 ENCOUNTER — Ambulatory Visit: Payer: Medicare Other | Admitting: Physician Assistant

## 2022-08-01 ENCOUNTER — Ambulatory Visit (HOSPITAL_COMMUNITY)
Admission: RE | Admit: 2022-08-01 | Discharge: 2022-08-01 | Disposition: A | Discharge status: Home / Self Care | Payer: Medicare Other | Source: Ambulatory Visit | Attending: Vascular Surgery | Admitting: Vascular Surgery

## 2022-08-01 VITALS — BP 121/70 | HR 96 | Temp 97.6°F | Resp 20 | Ht 59.0 in | Wt 118.6 lb

## 2022-08-01 DIAGNOSIS — I6523 Occlusion and stenosis of bilateral carotid arteries: Secondary | ICD-10-CM | POA: Diagnosis not present

## 2022-08-01 NOTE — Progress Notes (Signed)
Office Note   History of Present Illness   Kayla Mcclure is a 77 y.o. (1945/07/11) female who presents for surveillance of carotid artery stenosis.  She has a history of left carotid endarterectomy on 07/28/2019 by Dr. Randie Heinz.  This was done for symptomatic stenosis with TIA resulting in slurred speech and right arm numbness.  Shortly thereafter she underwent right carotid endarterectomy on 08/27/2019 by Dr. Randie Heinz.  This was done for asymptomatic critical stenosis.  She returns today for follow-up.  She denies any recent CVA or TIA diagnosis.  She denies any strokelike symptoms such as slurred speech, facial droop, sudden visual changes, or sudden weakness/numbness.  She also denies any rest pain, claudication, nonhealing wounds of lower extremities.  She takes a daily aspirin, Zetia, and atorvastatin.  Current Outpatient Medications  Medication Sig Dispense Refill   acetaminophen (TYLENOL) 325 MG tablet Take 650 mg by mouth every 6 (six) hours as needed for mild pain or moderate pain.     albuterol (VENTOLIN HFA) 108 (90 Base) MCG/ACT inhaler Inhale 2 puffs into the lungs every 6 (six) hours as needed for wheezing or shortness of breath.     alendronate (FOSAMAX) 70 MG tablet TAKE 1 TABLET BY MOUTH WEEKLY  WITH 8 OZ OF PLAIN WATER 30  MINUTES BEFORE FIRST FOOD, DRINK OR MEDS. STAY UPRIGHT FOR 30  MINS 8 tablet 5   aspirin EC 81 MG tablet Take 1 tablet (81 mg total) by mouth daily. 30 tablet    atorvastatin (LIPITOR) 80 MG tablet Take 1 tablet (80 mg total) by mouth daily. 90 tablet 3   ezetimibe (ZETIA) 10 MG tablet Take 1 tablet (10 mg total) by mouth daily. 90 tablet 3   ferrous gluconate (FERGON) 324 MG tablet Take 324 mg by mouth daily with breakfast.     metoprolol succinate (TOPROL-XL) 50 MG 24 hr tablet Take 1 tablet (50 mg total) by mouth at bedtime. 90 tablet 3   raloxifene (EVISTA) 60 MG tablet Take 60 mg by mouth daily.     pantoprazole (PROTONIX) 40 MG tablet Take 1 tablet  (40 mg total) by mouth 2 (two) times daily. 60 tablet 0   No current facility-administered medications for this visit.    REVIEW OF SYSTEMS (negative unless checked):   Cardiac:  []  Chest pain or chest pressure? []  Shortness of breath upon activity? []  Shortness of breath when lying flat? []  Irregular heart rhythm?  Vascular:  []  Pain in calf, thigh, or hip brought on by walking? []  Pain in feet at night that wakes you up from your sleep? []  Blood clot in your veins? []  Leg swelling?  Pulmonary:  []  Oxygen at home? []  Productive cough? []  Wheezing?  Neurologic:  []  Sudden weakness in arms or legs? []  Sudden numbness in arms or legs? []  Sudden onset of difficult speaking or slurred speech? []  Temporary loss of vision in one eye? []  Problems with dizziness?  Gastrointestinal:  []  Blood in stool? []  Vomited blood?  Genitourinary:  []  Burning when urinating? []  Blood in urine?  Psychiatric:  []  Major depression  Hematologic:  []  Bleeding problems? []  Problems with blood clotting?  Dermatologic:  []  Rashes or ulcers?  Constitutional:  []  Fever or chills?  Ear/Nose/Throat:  []  Change in hearing? []  Nose bleeds? []  Sore throat?  Musculoskeletal:  []  Back pain? []  Joint pain? []  Muscle pain?   Physical Examination   Vitals:   08/01/22 1418 08/01/22 1422  BP: 124/70 121/70  Pulse: 96   Resp: 20   Temp: 97.6 F (36.4 C)   TempSrc: Temporal   SpO2: (!) 71%   Weight: 118 lb 9.6 oz (53.8 kg)   Height: 4\' 11"  (1.499 m)    Body mass index is 23.95 kg/m.  General:  WDWN in NAD; vital signs documented above Gait: Not observed HENT: WNL, normocephalic Pulmonary: normal non-labored breathing  Cardiac: regular without carotid bruit Abdomen: soft, NT, no masses Skin: without rashes Vascular Exam/Pulses: Palpable radial pulses bilaterally Extremities: without ischemic changes, without Gangrene , without cellulitis; without open wounds;   Musculoskeletal: no muscle wasting or atrophy  Neurologic: A&O X 3;  No focal weakness or paresthesias are detected Psychiatric:  The pt has Normal affect.  Non-Invasive Vascular Imaging   B Carotid Duplex (07/06/2022):  R ICA stenosis:  1-39% R VA:  patent and antegrade L ICA stenosis:  1-39% L VA:  patent and antegrade   Medical Decision Making   Kayla Mcclure is a 77 y.o. female who presents for surveillance of carotid artery stenosis  Based on the patient's vascular studies, her bilateral carotid endarterectomy sites are patent without hemodynamically significant stenosis.  She has 1 to 39% stenosis of bilateral ICA She denies any recent CVA or TIA diagnosis.  She denies any strokelike symptoms.  She also has no claudication or rest pain. On exam she has palpable radial and DP pulses bilaterally She should continue her daily aspirin, Zetia, and statin.  She can follow-up with our office in 1 year with repeat carotid duplex   Loel Dubonnet PA-C [facility] Office: [phone]  Clinic MD: Randie Heinz

## 2022-08-10 ENCOUNTER — Other Ambulatory Visit: Payer: Self-pay

## 2022-08-10 DIAGNOSIS — M109 Gout, unspecified: Secondary | ICD-10-CM | POA: Diagnosis not present

## 2022-08-10 DIAGNOSIS — I739 Peripheral vascular disease, unspecified: Secondary | ICD-10-CM | POA: Diagnosis not present

## 2022-08-10 DIAGNOSIS — Q2730 Arteriovenous malformation, site unspecified: Secondary | ICD-10-CM | POA: Diagnosis not present

## 2022-08-10 DIAGNOSIS — N185 Chronic kidney disease, stage 5: Secondary | ICD-10-CM | POA: Diagnosis not present

## 2022-08-10 DIAGNOSIS — E785 Hyperlipidemia, unspecified: Secondary | ICD-10-CM | POA: Diagnosis not present

## 2022-08-10 DIAGNOSIS — I6523 Occlusion and stenosis of bilateral carotid arteries: Secondary | ICD-10-CM

## 2022-08-10 DIAGNOSIS — I5032 Chronic diastolic (congestive) heart failure: Secondary | ICD-10-CM | POA: Diagnosis not present

## 2022-08-10 DIAGNOSIS — E559 Vitamin D deficiency, unspecified: Secondary | ICD-10-CM | POA: Diagnosis not present

## 2022-08-10 DIAGNOSIS — I779 Disorder of arteries and arterioles, unspecified: Secondary | ICD-10-CM | POA: Diagnosis not present

## 2022-08-10 DIAGNOSIS — D696 Thrombocytopenia, unspecified: Secondary | ICD-10-CM | POA: Diagnosis not present

## 2022-08-10 DIAGNOSIS — I700 Atherosclerosis of aorta: Secondary | ICD-10-CM | POA: Diagnosis not present

## 2022-08-14 ENCOUNTER — Ambulatory Visit: Payer: Medicare Other | Admitting: Podiatry

## 2022-08-14 ENCOUNTER — Encounter: Payer: Self-pay | Admitting: Podiatry

## 2022-08-14 ENCOUNTER — Ambulatory Visit (INDEPENDENT_AMBULATORY_CARE_PROVIDER_SITE_OTHER): Payer: Medicare Other

## 2022-08-14 DIAGNOSIS — M7752 Other enthesopathy of left foot: Secondary | ICD-10-CM

## 2022-08-14 DIAGNOSIS — M7750 Other enthesopathy of unspecified foot: Secondary | ICD-10-CM

## 2022-08-14 DIAGNOSIS — M10372 Gout due to renal impairment, left ankle and foot: Secondary | ICD-10-CM

## 2022-08-14 MED ORDER — COLCHICINE 0.6 MG PO TABS: ORAL_TABLET | ORAL | 0 refills | Status: DC

## 2022-08-14 MED ORDER — METHYLPREDNISOLONE 4 MG PO TBPK: ORAL_TABLET | ORAL | 0 refills | Status: DC

## 2022-08-14 NOTE — Progress Notes (Signed)
  Subjective:  Patient ID: [name], female    DOB: 1945-07-28,  MRN: [id number]  Chief Complaint  Patient presents with   Foot Pain    new pt-L foot pain x 2 months;possible cyst bottom of foot, hurts to Awilda Bill, PA refer - Patient reports that she went to her PCP on Friday and since then has not been able to walk on that foot. There is no visible lesion or lump.     77 y.o. female presents with the above complaint. History confirmed with patient.   Objective:  Physical Exam: warm, good capillary refill, no trophic changes or ulcerative lesions, normal DP and PT pulses, normal sensory exam, and pain tenderness and swelling around the midfoot.  Radiographs: Multiple views x-ray of the left foot:  No acute fracture or dislocation, there is midfoot mild degenerative changes in the tarsometatarsal joints  Uric acid on 08/10/2022 was 9.5 Assessment:   1. Acute gout due to renal impairment involving left foot      Plan:  Patient was evaluated and treated and all questions answered.  We reviewed her x-rays together.  I do not see any acute osseous abnormality such as fracture or stress fracture.  She does have mild to moderate osteoarthritis of the midfoot.  I reviewed her lab work from her visit last week, her uric acid is 9.5.  I recommended treating this with a single dose of colchicine 1.2 mg followed by 0.6 mg 1 hour later and then following this with methylprednisolone taper starting tomorrow for 1 week.  Long-term may need to go onto allopurinol she will discuss this with her PCP for further chronic management.  She will follow-up me as needed if it does not improve or worsens.  Return if symptoms worsen or fail to improve.

## 2022-08-16 DIAGNOSIS — I701 Atherosclerosis of renal artery: Secondary | ICD-10-CM | POA: Diagnosis not present

## 2022-08-16 DIAGNOSIS — E785 Hyperlipidemia, unspecified: Secondary | ICD-10-CM | POA: Diagnosis not present

## 2022-08-16 DIAGNOSIS — N2581 Secondary hyperparathyroidism of renal origin: Secondary | ICD-10-CM | POA: Diagnosis not present

## 2022-08-16 DIAGNOSIS — N1832 Chronic kidney disease, stage 3b: Secondary | ICD-10-CM | POA: Diagnosis not present

## 2022-08-16 DIAGNOSIS — I129 Hypertensive chronic kidney disease with stage 1 through stage 4 chronic kidney disease, or unspecified chronic kidney disease: Secondary | ICD-10-CM | POA: Diagnosis not present

## 2022-08-16 DIAGNOSIS — M109 Gout, unspecified: Secondary | ICD-10-CM | POA: Diagnosis not present

## 2022-08-20 ENCOUNTER — Encounter: Payer: Self-pay | Admitting: Cardiology

## 2022-08-20 ENCOUNTER — Ambulatory Visit: Payer: Medicare Other | Attending: Cardiology | Admitting: Cardiology

## 2022-08-20 VITALS — BP 128/68 | HR 64 | Ht 59.0 in | Wt 122.0 lb

## 2022-08-20 DIAGNOSIS — E785 Hyperlipidemia, unspecified: Secondary | ICD-10-CM

## 2022-08-20 DIAGNOSIS — I6521 Occlusion and stenosis of right carotid artery: Secondary | ICD-10-CM

## 2022-08-20 DIAGNOSIS — Z87891 Personal history of nicotine dependence: Secondary | ICD-10-CM

## 2022-08-20 DIAGNOSIS — I25119 Atherosclerotic heart disease of native coronary artery with unspecified angina pectoris: Secondary | ICD-10-CM | POA: Diagnosis not present

## 2022-08-20 DIAGNOSIS — Z9861 Coronary angioplasty status: Secondary | ICD-10-CM

## 2022-08-20 DIAGNOSIS — R002 Palpitations: Secondary | ICD-10-CM | POA: Diagnosis not present

## 2022-08-20 DIAGNOSIS — I739 Peripheral vascular disease, unspecified: Secondary | ICD-10-CM | POA: Diagnosis not present

## 2022-08-20 DIAGNOSIS — E669 Obesity, unspecified: Secondary | ICD-10-CM

## 2022-08-20 DIAGNOSIS — I10 Essential (primary) hypertension: Secondary | ICD-10-CM | POA: Diagnosis not present

## 2022-08-20 DIAGNOSIS — I252 Old myocardial infarction: Secondary | ICD-10-CM | POA: Diagnosis not present

## 2022-08-20 DIAGNOSIS — I2510 Atherosclerotic heart disease of native coronary artery without angina pectoris: Secondary | ICD-10-CM | POA: Diagnosis not present

## 2022-08-20 DIAGNOSIS — R55 Syncope and collapse: Secondary | ICD-10-CM

## 2022-08-20 DIAGNOSIS — I5032 Chronic diastolic (congestive) heart failure: Secondary | ICD-10-CM

## 2022-08-20 DIAGNOSIS — N1832 Chronic kidney disease, stage 3b: Secondary | ICD-10-CM

## 2022-08-20 DIAGNOSIS — I6523 Occlusion and stenosis of bilateral carotid arteries: Secondary | ICD-10-CM

## 2022-08-20 NOTE — Patient Instructions (Signed)
Medication Instructions:   No changes *If you need a refill on your cardiac medications before your next appointment, please call your pharmacy*   Lab Work: Not need     Testing/Procedures: Not needed   Follow-Up: At [facility], you and your health needs are our priority.  As part of our continuing mission to provide you with exceptional heart care, we have created designated Provider Care Teams.  These Care Teams include your primary Cardiologist (physician) and Advanced Practice Providers (APPs -  Physician Assistants and Nurse Practitioners) who all work together to provide you with the care you need, when you need it.     Your next appointment:    6 to 8 month(s)  The format for your next appointment:   In Person  Provider:   Bryan Lemma, MD

## 2022-08-20 NOTE — Progress Notes (Unsigned)
Primary Care Provider: Wilfrid Lund, PA [facility] Cardiologist: Bryan Lemma, MD Electrophysiologist: None  Clinic Note: No chief complaint on file.   ===================================  ASSESSMENT/PLAN   Problem List Items Addressed This Visit       Cardiology Problems   PAD (peripheral artery disease) - bilateral CIA & CFA 50-69%; Claudication (Chronic)   Essential hypertension, benign (Chronic)   Dyslipidemia, goal LDL below 70 (Chronic)   Coronary artery disease involving native coronary artery of native heart with angina pectoris (HCC) (Chronic)   Chronic diastolic CHF (congestive heart failure) (HCC) (Chronic)   CAD S/P percutaneous coronary angioplasty -- Promus DES 2.5 mm x 20 mm postdilated to 3 mm - Primary (Chronic)   Asymptomatic carotid artery stenosis without infarction, right     Other   Syncope   Palpitations   Obesity (BMI 30-39.9) (Chronic)   History of MI (myocardial infarction)  - NSTEMI with PCI of RCA. (Chronic)   Former heavy cigarette smoker (20-39 per day) (Chronic)   CKD (chronic kidney disease) stage 3, GFR 30-59 ml/min (HCC)    ===================================  HPI:    Kayla Mcclure is a 77 y.o. female with a PMH below who presents today for 37-month follow-up at the request of Wilfrid Lund, [place].  Pertinent PMH: CAD/non-STEMI-RCA PCI with residual small caliber distal disease. LifeScan Myoview November 2023: Hyperdynamic LV function with a EF of 77%.  No ischemia or infarction.  LOW RISK.  Prominent RV uptake suggesting elevated PAP. PAD-PTCA of L Com-Ext iliac (2005) Dr. Allyson Sabal L Carotid CEA 07/2019 R Carotid CEA w/ Patch Angioplasty - Dr. Randie Heinz (07/2021) ZOX0R S/p Bilat Renal Stents (Dr. Randie Heinz -12/2021) HTN/ HLD H/o Near Syncope: Reassuring carotid Dopplers, Zio showed bradycardia but frequent episodes of PAT-nonsustained H/o GI Bld -> EGD showed erosive gastropathy. Thrombocytopenia-Plavix was  held Bronchiectasis, and pulmonary nodules Former heavy smoker-reduce to 1  cigarettes every couple days.  I last saw Kayla Mcclure on February 16, 2022 as follow-up after a Myoview performed that was nonischemic.  There is suggestion of pulmonary hypertension that was not adequately answered by the echocardiogram.  Chest pain thought to be musculoskeletal but also related to bronchiectasis.  She was last seen on April 13, 2022 by Joni Reining, NP as a follow-up from hospitalization for GI bleed (hemoglobin to 6.5).  Beta-blocker was held because of hypotension and Plavix held due to thrombocytopenia.  Since discharge, was placed back on valsartan 40 mg daily and Toprol restarted at 50 mg daily.  Started to notice signs of exertional dyspnea weakness and dizziness.  Hemoglobin was down to 8.1 (had been back up to 9.5).  No frank bleeding. => Referred to [facility] GI Department).  Recommended GI follow-up and as well as hematology evaluation for thrombocytopenia. Valsartan was discontinued and Toprol continued.-Blood pressures were low and she was orthostatic. Was continued on aspirin, Plavix held. ?  Question the role of the CKD on her anemia.  Recent Hospitalizations: ***  Reviewed  CV studies:    The following studies were reviewed today: (if available, images/films reviewed: From Epic Chart or Care Everywhere) 14-day Zio patch October 24-February 06, 2022   Predominant underlying Rhythm: Sinus.  HR range 50-120 bpm, avg 67 bpm.   Occasional(1.2%) PACs with rare couplets and triplets.  No PVCs.   1 patient triggered event and sinus rhythm.   188 Atrial Runs: Fastest Run was 14 beats wiht HR range 114-169 bpm (avg 149 bpm, 5.9 sec); Longests  Run was 18 beats (9.3 sec) with HR range 86-150 bpm & avg 117 bpm.   No Sustained Arrhythmias: Atrial Tachycardia (AT), Supraventricular Tachycardia (SVT), Atrial Fibrillation (A-Fib), Atrial Flutter (A-Flutter), Sustained  Ventricular Tachycardia (VT):  Interval History:   Kayla Mcclure   CV Review of Symptoms (Summary): Cardiovascular ROS: {roscv:310661}  REVIEWED OF SYSTEMS   ROS  I have reviewed and (if needed) personally updated the patient's problem list, medications, allergies, past medical and surgical history, social and family history.   PAST MEDICAL HISTORY   Past Medical History:  Diagnosis Date   Acute GI bleeding 08/07/2019   Initially was in September 2017   Asthma 10/31/2007   CAD (coronary artery disease) 08/05/2007   a) NSTEMI 2009: PCI to the RCA Promus DES 2.5 mm x 23 mm (3.0 mm). b) Myoview June 2017: Nonspecific ST changes. EF greater than 65%. LOW RISK, normal study. No ischemia or infarction.   Carotid artery disease [facility])    s/p Bilateral CEA March & May 2020.    Chronic back pain    Chronic diastolic heart failure (HCC)    CKD (chronic kidney disease), stage III (HCC)    Diverticulosis 04/11/2010   Colonoscopy   Dyslipidemia, goal LDL below 70    Essential hypertension    a. Normal renal arteries by PV angio 05/2013.   GERD (gastroesophageal reflux disease)    History of kidney stones 1975   surgery to remove   History of recent blood transfusion 12/2015   12-19-15, 12-20-15 and 12-21-15   Insomnia    Internal hemorrhoids 04/11/2010   By colonoscopy   Iron deficiency anemia    Lung nodules 05/2021   bilateral   Myocardial infarction [facility]) 2009   Peripheral arterial disease (HCC)    a. s/p PTA and stenting of L CIA stenosis 05/2013.   Stroke [facility])    mini stroke   Tobacco abuse    Quit in 05/2013    PAST SURGICAL HISTORY   Past Surgical History:  Procedure Laterality Date   BRONCHIAL BIOPSY  06/06/2021   Procedure: BRONCHIAL BIOPSIES;  Surgeon: Josephine Igo, DO;  Location: [facility] ENDOSCOPY;  Service: Pulmonary;;   BRONCHIAL BRUSHINGS  06/06/2021   Procedure: BRONCHIAL BRUSHINGS;  Surgeon: Josephine Igo, DO;  Location: [facility] ENDOSCOPY;  Service:  Pulmonary;;   BRONCHIAL NEEDLE ASPIRATION BIOPSY  06/06/2021   Procedure: BRONCHIAL NEEDLE ASPIRATION BIOPSIES;  Surgeon: Josephine Igo, DO;  Location: [facility] ENDOSCOPY;  Service: Pulmonary;;   CAPSULE ENDOSCOPY  10/27/2019   [facility]) evidence of "pinhole leaking" in both stomach and proximal small bowel; no overt bleeding noted   CHOLECYSTECTOMY  1975   COLONOSCOPY  04/11/2010   COLONOSCOPY  11/30/2015   CORONARY ANGIOPLASTY WITH STENT PLACEMENT  08/05/2007   Promus DES 2.5 mm x 23 mm (3 mm)  to RCA; EF 60-70%   ENDARTERECTOMY Left 07/28/2019   Procedure: ENDARTERECTOMY CAROTID;  Surgeon: Maeola Harman, MD;  Location: [facility] OR;  Service: Vascular;  Laterality: Left;   ENDARTERECTOMY Right 08/27/2019   Procedure: ENDARTERECTOMY CAROTID RIGHT WITH PATCH ANGIOPLASTY;  Surgeon: Maeola Harman, MD;  Location: [facility] OR;  Service: Vascular;  Laterality: Right;   ENTEROSCOPY N/A 12/27/2015   Procedure: ENTEROSCOPY;  Surgeon: Vida Rigger, MD;  Location: [facility] ENDOSCOPY;  Service: Endoscopy;  Laterality: N/A;  also needs slim egd scope   ENTEROSCOPY N/A 05/15/2019   Procedure: ENTEROSCOPY;  Surgeon: Bernette Redbird, MD;  Location: [facility] ENDOSCOPY;  Service: Endoscopy;  Laterality:  N/A;  Push enteroscopy using either ultraslim colonoscope or push enteroscope   ENTEROSCOPY N/A 08/08/2019   Procedure: ENTEROSCOPY;  Surgeon: Kerin Salen, MD;  Location: [facility] ENDOSCOPY;  Service: Gastroenterology;  Laterality: N/A;   ENTEROSCOPY N/A 03/22/2022   Procedure: ENTEROSCOPY;  Surgeon: Willis Modena, MD;  Location: [facility] ENDOSCOPY;  Service: Gastroenterology;  Laterality: N/A;   ESOPHAGOGASTRODUODENOSCOPY N/A 09/13/2013   Procedure: ESOPHAGOGASTRODUODENOSCOPY (EGD);  Surgeon: Shirley Friar, MD;  Location: [facility] ENDOSCOPY;  Service: Endoscopy;  Laterality: N/A;   ESOPHAGOGASTRODUODENOSCOPY N/A 07/14/2015   Procedure: ESOPHAGOGASTRODUODENOSCOPY (EGD);  Surgeon: Vida Rigger, MD;  Location: [facility] ENDOSCOPY;  Service:  Endoscopy;  Laterality: N/A;   ESOPHAGOGASTRODUODENOSCOPY (EGD) WITH PROPOFOL N/A 09/09/2020   Procedure: ESOPHAGOGASTRODUODENOSCOPY (EGD) WITH PROPOFOL;  Surgeon: Vida Rigger, MD;  Location: [facility] ENDOSCOPY;  Service: Endoscopy;  Laterality: N/A;   FIDUCIAL MARKER PLACEMENT  06/06/2021   Procedure: FIDUCIAL MARKER PLACEMENT;  Surgeon: Josephine Igo, DO;  Location: [facility] ENDOSCOPY;  Service: Pulmonary;;   gi bleed  07/2015   blood given   GIVENS CAPSULE STUDY N/A 03/20/2022   Procedure: GIVENS CAPSULE STUDY;  Surgeon: Willis Modena, MD;  Location: [facility] ENDOSCOPY;  Service: Gastroenterology;  Laterality: N/A;   HOT HEMOSTASIS N/A 12/27/2015   Procedure: HOT HEMOSTASIS (ARGON PLASMA COAGULATION/BICAP);  Surgeon: Vida Rigger, MD;  Location: [facility] ENDOSCOPY;  Service: Endoscopy;  Laterality: N/A;   HOT HEMOSTASIS N/A 08/08/2019   Procedure: HOT HEMOSTASIS (ARGON PLASMA COAGULATION/BICAP);  Surgeon: Kerin Salen, MD;  Location: [facility] ENDOSCOPY;  Service: Gastroenterology;  Laterality: N/A;   HOT HEMOSTASIS N/A 09/09/2020   Procedure: HOT HEMOSTASIS (ARGON PLASMA COAGULATION/BICAP);  Surgeon: Vida Rigger, MD;  Location: [facility] ENDOSCOPY;  Service: Endoscopy;  Laterality: N/A;   ILIAC ARTERY STENT Left 05/04/2013   L CIA-EIA -- Dr. Allyson Sabal   LEFT HEART CATH AND CORONARY ANGIOGRAPHY N/A 02/11/2019   Procedure: LEFT HEART CATH AND CORONARY ANGIOGRAPHY;  Surgeon: Marykay Lex, MD;  Location: [facility] INVASIVE CV LAB;;-  Patent RCA stent with minimal ISR after pRCA 50%).  Small caliber RPAV with ~75%.  pLAD 30%, m-dLAD 40%, D2 40%.  Normal LVEDP and LVEF.   LOWER EXTREMITY ANGIOGRAM Bilateral 05/04/2013   Procedure: LOWER EXTREMITY ANGIOGRAM;  Surgeon: Runell Gess, MD;  Location: [facility] CATH LAB;  Service: Cardiovascular;  Laterality: Bilateral;   NM MYOVIEW LTD  09/07/2015   EF > 65%. Nonspecific ST changes but no ischemic changes. No ischemia or infarction. LOW RISK   PATCH ANGIOPLASTY Left 07/28/2019   Procedure: PATCH  ANGIOPLASTY USING Livia Snellen BIOLOGIC PATCH;  Surgeon: Maeola Harman, MD;  Location: [facility] OR;  Service: Vascular;  Laterality: Left;   RENAL ANGIOGRAPHY N/A 01/15/2022   Procedure: RENAL ANGIOGRAPHY;  Surgeon: Maeola Harman, MD;  Location: [facility] INVASIVE CV LAB;  Service: Cardiovascular;  Laterality: N/A;   TONSILLECTOMY  1960's   TRANSTHORACIC ECHOCARDIOGRAM  05/14/2019   Normal EF 60 to 65%.  No R WMA.   Normal RV but mildly elevated pressures.  Relatively normal aortic and mitral valves.   UPPER GI ENDOSCOPY  12/14/2015   VIDEO BRONCHOSCOPY WITH RADIAL ENDOBRONCHIAL ULTRASOUND  06/06/2021   Procedure: RADIAL ENDOBRONCHIAL ULTRASOUND;  Surgeon: Josephine Igo, DO;  Location: [facility] ENDOSCOPY;  Service: Pulmonary;;    Cardiac Cath 02/11/2019: Patent RCA stent with minimal ISR after pRCA 50%).  Small caliber RPAV with ~75%.  pLAD 30%, m-dLAD 40%, D2 40%.  Normal LVEDP and LVEF.   MEDICATIONS/ALLERGIES   Current Meds  Medication Sig   acetaminophen (  TYLENOL) 325 MG tablet Take 650 mg by mouth every 6 (six) hours as needed for mild pain or moderate pain.   albuterol (VENTOLIN HFA) 108 (90 Base) MCG/ACT inhaler Inhale 2 puffs into the lungs every 6 (six) hours as needed for wheezing or shortness of breath.   alendronate (FOSAMAX) 70 MG tablet TAKE 1 TABLET BY MOUTH WEEKLY  WITH 8 OZ OF PLAIN WATER 30  MINUTES BEFORE FIRST FOOD, DRINK OR MEDS. STAY UPRIGHT FOR 30  MINS   allopurinol (ZYLOPRIM) 100 MG tablet Take 100 mg by mouth daily.   aspirin EC 81 MG tablet Take 1 tablet (81 mg total) by mouth daily.   atorvastatin (LIPITOR) 80 MG tablet Take 1 tablet (80 mg total) by mouth daily.   ezetimibe (ZETIA) 10 MG tablet Take 1 tablet (10 mg total) by mouth daily.   ferrous gluconate (FERGON) 324 MG tablet Take 324 mg by mouth daily with breakfast.   methylPREDNISolone (MEDROL DOSEPAK) 4 MG TBPK tablet 6 day dose pack - take as directed   metoprolol succinate (TOPROL-XL) 50 MG 24 hr  tablet Take 1 tablet (50 mg total) by mouth at bedtime.   nitroGLYCERIN (NITROSTAT) 0.4 MG SL tablet as directed Sublingual once a day for 34 days - As needed   raloxifene (EVISTA) 60 MG tablet Take 60 mg by mouth daily.    Allergies  Allergen Reactions   Diclofenac Sodium     Other Reaction(s): History of GI bleeds   Nsaids     Other Reaction(s): History of GI bleeds   Prednisone     Other Reaction(s): History of GI bleeds    SOCIAL HISTORY/FAMILY HISTORY   Reviewed in Epic:  Pertinent findings:  Social History   Tobacco Use   Smoking status: Some Days    Types: Cigarettes    Last attempt to quit: 05/04/2013    Years since quitting: 9.3    Passive exposure: Never   Smokeless tobacco: Never   Tobacco comments:    She initially quit, but has gone back to smoking a couple cigarettes every now and then.    02/16/2022 Patient states "I smoke 1-2 about every two to three days.   Vaping Use   Vaping Use: Never used  Substance Use Topics   Alcohol use: Yes    Comment: occasionally 3 times a year   Drug use: No   Social History   Social History Narrative   Married. Has one child. Grandmother for a great-grandmother and 1.   No real exercise.   Occasional alcohol consumption.   Quit smoking inf Feb 2015 -- after L Iliac Stent   Right-handed   Caffeine: 1 cup every morning   Right-handed.   Lives at home with husband.    OBJCTIVE -PE, EKG, labs   Wt Readings from Last 3 Encounters:  08/20/22 122 lb (55.3 kg)  08/01/22 118 lb 9.6 oz (53.8 kg)  05/25/22 117 lb 6.4 oz (53.3 kg)    Physical Exam: BP 128/68 (BP Location: Left Arm, Patient Position: Sitting, Cuff Size: Normal)   Pulse 64   Ht 4\' 11"  (1.499 m)   Wt 122 lb (55.3 kg)   SpO2 100%   BMI 24.64 kg/m  Physical Exam  No carotid bruit (Well-healed CEA scars bilaterally.) or JVD.  Cardiovascular:     Rate and Rhythm: Normal rate and regular rhythm. Occasional Extrasystoles are present.    Chest Wall: PMI is  not displaced.     Pulses: Decreased pulses (  Diminished bilateral pedal pulses, but palpable.  Bilateral radial pulses are strong).     Heart sounds: S1 normal and S2 normal. No murmur heard.    No friction rub. No gallop.  Pulmonary:     Effort: Pulmonary effort is normal. No respiratory distress.     Breath sounds: Normal breath sounds. No wheezing, rhonchi or rales.  Chest:     Chest wall: Tenderness   Abnormal gait.  Mild memory loss   Adult ECG Report  Rate: *** ;  Rhythm: {rhythm:17366};   Narrative Interpretation: ***  Recent Labs:  ***  Lab Results  Component Value Date   CHOL 91 05/15/2019   HDL 45 05/15/2019   LDLCALC 35 05/15/2019   TRIG 55 05/15/2019   CHOLHDL 2.0 05/15/2019   Lab Results  Component Value Date   CREATININE 1.21 (H) 03/23/2022   BUN 19 03/23/2022   NA 139 03/23/2022   K 4.2 03/23/2022   CL 107 03/23/2022   CO2 27 03/23/2022      Latest Ref Rng & Units 04/13/2022   10:26 AM 03/23/2022    3:25 AM 03/21/2022    5:53 PM  CBC  WBC 3.4 - 10.8 x10E3/uL 6.7  6.3  8.0   Hemoglobin 11.1 - 15.9 g/dL 8.9  8.1  9.1   Hematocrit 34.0 - 46.6 % 27.2  24.4  29.0   Platelets 150 - 450 x10E3/uL 222  161  173     Lab Results  Component Value Date   HGBA1C 6.4 (H) 05/15/2019   Lab Results  Component Value Date   TSH 2.161 04/28/2013    ================================================== I spent a total of ***minutes with the patient spent in direct patient consultation.  Additional time spent with chart review  / charting (studies, outside notes, etc): *** min Total Time: *** min  Current medicines are reviewed at length with the patient today.  (+/- concerns) ***  Notice: This dictation was prepared with Dragon dictation along with smart phrase technology. Any transcriptional errors that result from this process are unintentional and may not be corrected upon review.  Studies Ordered:   No orders of the defined types were placed in this  encounter.  No orders of the defined types were placed in this encounter.   Patient Instructions / Medication Changes & Studies & Tests Ordered   There are no Patient Instructions on file for this visit.     Marykay Lex, MD, MS Bryan Lemma, M.D., M.S. Interventional Cardiologist  [facility]  Pager # [phone] Phone # [phone] [address] [place], [place] [address]   Thank you for choosing [facility] [place]!!

## 2022-08-21 ENCOUNTER — Encounter: Payer: Self-pay | Admitting: Cardiology

## 2022-08-21 NOTE — Assessment & Plan Note (Signed)
Lipid panel is pretty well-controlled with an LDL of 61 combination of Zetia +80 mg atorvastatin.  No real myalgias.

## 2022-08-21 NOTE — Assessment & Plan Note (Signed)
Most recent cath in 2020 showed stable widely patent LAD stent with PDA stenosis.  Treated medically.  Myoview and carotid was nonischemic be ramus is not significant clinically. She has mild twinging pains that sound more musculoskeletal that is reproducible on exam. With a nonischemic Myoview, and no angina despite hemoglobin levels, 6.5, IVUS stated she is stable [name] standpoint.  Plan:  BP is actually stable having stopped the amlodipine valsartan and Lasix and Imdur.  She is now only on 50 mg Toprol which is a reduced dose. Remains on aspirin as well as atorvastatin and Zetia.  Lipids well-controlled.  Has done okay with aspirin being held for procedures and GI bleed.

## 2022-08-21 NOTE — Assessment & Plan Note (Signed)
Significant PAT burden but now seems to be much more stable even though she is on a notably lower dose of beta-blocker.

## 2022-08-21 NOTE — Assessment & Plan Note (Signed)
Fast with bilateral CEA.  Most recent Dopplers look stable.  Normal flow.  No restenosis. On aspirin and statin

## 2022-08-21 NOTE — Assessment & Plan Note (Signed)
Distant history of MI with RCA PCI over 3 years ago almost 4 years ago now.  Doing well.  Stent with patent by most recent cath and then also proven to be patent with residual disease nonocclusive by Myoview in 2023.  Preserved EF.

## 2022-08-21 NOTE — Assessment & Plan Note (Signed)
Euvolemic on exam.  No edema no PND or apnea. Not really been noticing any significant exertional dyspnea.  Had been on high-dose Toprol along with losartan, and amlodipine for BP ; now only on modest dose of Toprol 50 mill (down from 200 mg daily) Had also been on intermittent dosing of furosemide which has been discontinued.  Not really having any edema.  No PND orthopnea.  Watch closely as she may need to reinitiate therapy as her blood pressures may trend up. For now hold off on adding back diuretic unless she needs it.

## 2022-08-21 NOTE — Assessment & Plan Note (Signed)
Reassuring echo and carotid Dopplers as well as nonischemic Myoview.  Monitor did not show any significant prolonged arrhythmias.  Thankfully she is not really have any symptoms anymore although she does feel a bit short little bursts of tachycardia seen on monitor.  Continue to maintain adequate hydration.  Actually glad that she is no longer on diuretic and not requiring it.  She is also on less medications to avoid orthostasis.

## 2022-08-21 NOTE — Assessment & Plan Note (Signed)
BP is actually stable having stopped the amlodipine valsartan and Lasix and Imdur.  She is now only on 50 mg Toprol which is a reduced dose.  She has been taking Lasix Monday Wednesday Friday, that was discontinued probably during one of her hospitalizations most likely with GI bleed. Amlodipine was discontinued along with Imdur during her hospitalization. Additionally because of hypotension, valsartan was discontinued last visit.  For now, we will simply maintain on Toprol 50 mg daily.  Her blood pressure looks great.  Avoiding polypharmacy is beneficial.

## 2022-08-21 NOTE — Assessment & Plan Note (Signed)
RCA PCI with 2.5 mm stent dilated to 3.0 mm.-Switch from Plavix to aspirin because of bleeding issues.  Plan to maintain aspirin monotherapy.  Okay to hold for procedures or surgeries.

## 2022-09-10 ENCOUNTER — Telehealth: Payer: Self-pay | Admitting: Podiatry

## 2022-09-10 NOTE — Telephone Encounter (Signed)
Received voicemail today from Conner at [facility] stating this pt needed a refill on her Allopurinol 100mg  1 tablet day. She only had 7 left and that was last Thursday. [facility]eds to be [facility] and they would not send a refill request because pt has gotten medication from multiple pharmacies.She said this was new from Dr Ella Jubilee   Returned call and left message for Doylene Canard that we are a podiatrist office and would not be the one that would be filling this as Dr Ella Jubilee does not work in our office. We only treat pt for foot and ankle issues.   Our provider did tell pt to talk to pcp about the allopurinol prescription.

## 2022-10-08 ENCOUNTER — Other Ambulatory Visit: Payer: Self-pay

## 2022-10-08 DIAGNOSIS — R911 Solitary pulmonary nodule: Secondary | ICD-10-CM

## 2022-10-08 DIAGNOSIS — Z87891 Personal history of nicotine dependence: Secondary | ICD-10-CM

## 2022-10-09 ENCOUNTER — Inpatient Hospital Stay: Admission: RE | Admit: 2022-10-09 | Payer: Medicare Other | Source: Ambulatory Visit

## 2022-10-09 ENCOUNTER — Ambulatory Visit
Admission: RE | Admit: 2022-10-09 | Discharge: 2022-10-09 | Disposition: A | Discharge status: Home / Self Care | Payer: Medicare Other | Source: Ambulatory Visit | Attending: Pulmonary Disease | Admitting: Pulmonary Disease

## 2022-10-09 DIAGNOSIS — R911 Solitary pulmonary nodule: Secondary | ICD-10-CM | POA: Diagnosis not present

## 2022-10-09 DIAGNOSIS — I517 Cardiomegaly: Secondary | ICD-10-CM | POA: Diagnosis not present

## 2022-10-09 DIAGNOSIS — Z87891 Personal history of nicotine dependence: Secondary | ICD-10-CM

## 2022-10-09 DIAGNOSIS — I700 Atherosclerosis of aorta: Secondary | ICD-10-CM | POA: Diagnosis not present

## 2022-10-12 ENCOUNTER — Encounter: Payer: Medicare Other | Admitting: Acute Care

## 2022-10-12 ENCOUNTER — Telehealth: Payer: Self-pay | Admitting: Acute Care

## 2022-10-12 ENCOUNTER — Encounter: Payer: Self-pay | Admitting: Acute Care

## 2022-10-12 DIAGNOSIS — R911 Solitary pulmonary nodule: Secondary | ICD-10-CM

## 2022-10-12 NOTE — Telephone Encounter (Signed)
I have called the patient with the results of her follow up Ct chest. The scan shows Previously noted nodularity in the right upper lobe is more conspicuous than the prior examination. On today's study, this appears to represent a solitary lesion, but is very irregular in appearance, overall measuring 2.3 x 1.2 x 1.5 cm (axial image 28 of series 8 and sagittal image 64 of series 6). This lesion has highly irregular macrolobulated and spiculated margins, and is concerning for neoplasm..  There are also scattered areas of cylindrical, varicose and mild cystic bronchiectasis scattered throughout the lungs bilaterally, I explained that we need to repeat her biopsy, as there is progressive growth of the nodule. I have consulted with Dr. Tonia Brooms, and asked if we needed a repeat PET scan vs Super D Ct Chest.I will order PET and we will schedule patient for a bronch after the scan has been reviewed.

## 2022-10-12 NOTE — Progress Notes (Signed)
Reviewed at OV with SG  Thanks,  BLI  Josephine Igo, DO [facility] 10/12/2022 5:14 PM

## 2022-11-05 ENCOUNTER — Encounter (HOSPITAL_COMMUNITY)
Admission: RE | Admit: 2022-11-05 | Discharge: 2022-11-05 | Disposition: A | Discharge status: Home / Self Care | Payer: Medicare Other | Source: Ambulatory Visit | Attending: Acute Care | Admitting: Acute Care

## 2022-11-05 DIAGNOSIS — R911 Solitary pulmonary nodule: Secondary | ICD-10-CM | POA: Insufficient documentation

## 2022-11-05 DIAGNOSIS — R918 Other nonspecific abnormal finding of lung field: Secondary | ICD-10-CM | POA: Diagnosis not present

## 2022-11-05 LAB — GLUCOSE, CAPILLARY: Glucose-Capillary: 101 mg/dL — ABNORMAL HIGH (ref 70–99)

## 2022-11-05 MED ORDER — FLUDEOXYGLUCOSE F - 18 (FDG) INJECTION
6.0000 | Freq: Once | INTRAVENOUS | Status: AC | PRN
  Administered 2022-11-05: 5.95 via INTRAVENOUS

## 2022-11-09 ENCOUNTER — Telehealth: Payer: Self-pay | Admitting: Acute Care

## 2022-11-09 NOTE — Telephone Encounter (Signed)
I have attempted to call the patient with the results of her PET scan. There was no answer on either Cell phone or Home phone. There was no option to leave a message of any kind on either line. She needs repeat bronchoscopy. There is a number for her son and DPR. I will attempt to call her again early next week, and if I am still unable to reach her I will call her son.  I will put her on my tickle list for Wednesday next week. Thanks

## 2022-11-09 NOTE — Telephone Encounter (Signed)
PET done 11/05/22:  IMPRESSION: 2.9 cm irregular nodule in the medial right lung apex, suspicious for primary bronchogenic carcinoma.   Possible right paratracheal and right perihilar nodal metastases, equivocal.   Additional bilateral upper lobe opacities favor sequela of chronic infection/inflammation, possibly chronic atypical mycobacterial infection.   No evidence of metastatic disease in the abdomen/pelvis.  Please see PET results needed prior to scheduling another FOB.
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
## 2022-11-14 ENCOUNTER — Ambulatory Visit: Payer: Medicare Other | Admitting: Physician Assistant

## 2022-11-14 ENCOUNTER — Encounter: Payer: Self-pay | Admitting: Physician Assistant

## 2022-11-14 ENCOUNTER — Ambulatory Visit (HOSPITAL_COMMUNITY)
Admission: RE | Admit: 2022-11-14 | Discharge: 2022-11-14 | Disposition: A | Discharge status: Home / Self Care | Payer: Medicare Other | Source: Ambulatory Visit | Attending: Vascular Surgery | Admitting: Vascular Surgery

## 2022-11-14 VITALS — BP 173/76 | HR 75 | Temp 98.2°F | Resp 18 | Ht 59.0 in | Wt 122.9 lb

## 2022-11-14 DIAGNOSIS — I701 Atherosclerosis of renal artery: Secondary | ICD-10-CM

## 2022-11-14 DIAGNOSIS — F17200 Nicotine dependence, unspecified, uncomplicated: Secondary | ICD-10-CM | POA: Diagnosis not present

## 2022-11-14 NOTE — Telephone Encounter (Signed)
See phone note from 8/9.

## 2022-11-15 ENCOUNTER — Telehealth: Payer: Self-pay | Admitting: Acute Care

## 2022-11-15 ENCOUNTER — Other Ambulatory Visit: Payer: Self-pay | Admitting: Acute Care

## 2022-11-15 DIAGNOSIS — R911 Solitary pulmonary nodule: Secondary | ICD-10-CM

## 2022-11-15 NOTE — Telephone Encounter (Signed)
I have called Kayla Mcclure with the results of her PET scan.  I explained that the nodule in the medial right lung apex has increased in size and continues to be suspicious for bronchogenic carcinoma. Even though we got negative biopsy results in March 2023 as the nodule has continued to grow and remains SUV avid plan will be to repeat biopsy for additional tissue sampling. Patient is in agreement with this plan.  She says she has already discussed this with her son.  We discussed the risk of bronchoscopy to include bleeding, adverse reaction to anesthesia, infection, and pneumothorax.  Again we discussed that pneumothorax can be treated with chest tube insertion with immediate resolution. Patient verbalized that she understands and she remembered this from her previous bronchoscopy done in March 2023. Plan will be for bronchoscopy at the earliest date Dr. Tonia Brooms has available. Kayla Mcclure, and Kayla Mcclure please fax PET scan results to PCP and let them know plan will be for repeat biopsy of the medial right lung apex to reassess for malignancy by Dr. Tonia Brooms. We will keep her updated on the results.

## 2022-11-15 NOTE — Telephone Encounter (Signed)
See telephone note from 11/15/22

## 2022-11-15 NOTE — Telephone Encounter (Signed)
PET results faxed to PCP with follow up plans included.  ?

## 2022-11-15 NOTE — Progress Notes (Signed)
I have called the patient and reminded her nothing by mouth after midnight the night before her procedure. She verbalized understanding.  She cannot schedule for 8/20, so she will schedule for 8/27

## 2022-11-15 NOTE — Telephone Encounter (Signed)
Order has been placed for Bronch 11/27/2022.

## 2022-11-16 ENCOUNTER — Other Ambulatory Visit: Payer: Self-pay

## 2022-11-16 DIAGNOSIS — Z8249 Family history of ischemic heart disease and other diseases of the circulatory system: Secondary | ICD-10-CM

## 2022-11-16 DIAGNOSIS — I6523 Occlusion and stenosis of bilateral carotid arteries: Secondary | ICD-10-CM

## 2022-11-16 DIAGNOSIS — I701 Atherosclerosis of renal artery: Secondary | ICD-10-CM

## 2022-11-20 ENCOUNTER — Telehealth: Payer: Self-pay | Admitting: Acute Care

## 2022-11-20 NOTE — Telephone Encounter (Signed)
Will send to [facility] for update to patient

## 2022-11-20 NOTE — Telephone Encounter (Signed)
Having a procedure done but doesn't have the details of the appointment

## 2022-11-21 ENCOUNTER — Telehealth: Payer: Self-pay | Admitting: Pulmonary Disease

## 2022-11-21 NOTE — Telephone Encounter (Signed)
Pt calling for her appointment details for her surgery . Pls advise

## 2022-11-24 ENCOUNTER — Emergency Department (HOSPITAL_BASED_OUTPATIENT_CLINIC_OR_DEPARTMENT_OTHER): Payer: Medicare Other

## 2022-11-24 ENCOUNTER — Observation Stay (HOSPITAL_BASED_OUTPATIENT_CLINIC_OR_DEPARTMENT_OTHER)
Admission: EM | Admit: 2022-11-24 | Discharge: 2022-11-26 | Disposition: A | Discharge status: Home / Self Care | Payer: Medicare Other | Attending: Internal Medicine | Admitting: Internal Medicine

## 2022-11-24 ENCOUNTER — Encounter (HOSPITAL_BASED_OUTPATIENT_CLINIC_OR_DEPARTMENT_OTHER): Payer: Self-pay | Admitting: Emergency Medicine

## 2022-11-24 ENCOUNTER — Other Ambulatory Visit: Payer: Self-pay

## 2022-11-24 DIAGNOSIS — M25519 Pain in unspecified shoulder: Secondary | ICD-10-CM | POA: Insufficient documentation

## 2022-11-24 DIAGNOSIS — Z79899 Other long term (current) drug therapy: Secondary | ICD-10-CM | POA: Diagnosis not present

## 2022-11-24 DIAGNOSIS — Z87891 Personal history of nicotine dependence: Secondary | ICD-10-CM | POA: Insufficient documentation

## 2022-11-24 DIAGNOSIS — I5032 Chronic diastolic (congestive) heart failure: Secondary | ICD-10-CM | POA: Insufficient documentation

## 2022-11-24 DIAGNOSIS — J45901 Unspecified asthma with (acute) exacerbation: Secondary | ICD-10-CM | POA: Insufficient documentation

## 2022-11-24 DIAGNOSIS — I130 Hypertensive heart and chronic kidney disease with heart failure and stage 1 through stage 4 chronic kidney disease, or unspecified chronic kidney disease: Secondary | ICD-10-CM | POA: Insufficient documentation

## 2022-11-24 DIAGNOSIS — J441 Chronic obstructive pulmonary disease with (acute) exacerbation: Secondary | ICD-10-CM | POA: Insufficient documentation

## 2022-11-24 DIAGNOSIS — M85871 Other specified disorders of bone density and structure, right ankle and foot: Secondary | ICD-10-CM | POA: Diagnosis not present

## 2022-11-24 DIAGNOSIS — J9601 Acute respiratory failure with hypoxia: Secondary | ICD-10-CM | POA: Diagnosis not present

## 2022-11-24 DIAGNOSIS — I10 Essential (primary) hypertension: Secondary | ICD-10-CM | POA: Diagnosis present

## 2022-11-24 DIAGNOSIS — N183 Chronic kidney disease, stage 3 unspecified: Secondary | ICD-10-CM | POA: Insufficient documentation

## 2022-11-24 DIAGNOSIS — D500 Iron deficiency anemia secondary to blood loss (chronic): Secondary | ICD-10-CM | POA: Diagnosis not present

## 2022-11-24 DIAGNOSIS — Z8673 Personal history of transient ischemic attack (TIA), and cerebral infarction without residual deficits: Secondary | ICD-10-CM | POA: Diagnosis not present

## 2022-11-24 DIAGNOSIS — E785 Hyperlipidemia, unspecified: Secondary | ICD-10-CM | POA: Diagnosis present

## 2022-11-24 DIAGNOSIS — I739 Peripheral vascular disease, unspecified: Secondary | ICD-10-CM | POA: Diagnosis present

## 2022-11-24 DIAGNOSIS — S99911A Unspecified injury of right ankle, initial encounter: Secondary | ICD-10-CM | POA: Diagnosis not present

## 2022-11-24 DIAGNOSIS — M7989 Other specified soft tissue disorders: Secondary | ICD-10-CM | POA: Diagnosis not present

## 2022-11-24 DIAGNOSIS — M25571 Pain in right ankle and joints of right foot: Secondary | ICD-10-CM | POA: Diagnosis present

## 2022-11-24 DIAGNOSIS — R918 Other nonspecific abnormal finding of lung field: Secondary | ICD-10-CM | POA: Diagnosis present

## 2022-11-24 DIAGNOSIS — M79671 Pain in right foot: Secondary | ICD-10-CM | POA: Diagnosis not present

## 2022-11-24 DIAGNOSIS — Z720 Tobacco use: Secondary | ICD-10-CM | POA: Diagnosis present

## 2022-11-24 DIAGNOSIS — M79604 Pain in right leg: Principal | ICD-10-CM

## 2022-11-24 DIAGNOSIS — I2510 Atherosclerotic heart disease of native coronary artery without angina pectoris: Secondary | ICD-10-CM | POA: Diagnosis not present

## 2022-11-24 DIAGNOSIS — R600 Localized edema: Secondary | ICD-10-CM | POA: Diagnosis not present

## 2022-11-24 DIAGNOSIS — M85861 Other specified disorders of bone density and structure, right lower leg: Secondary | ICD-10-CM | POA: Diagnosis not present

## 2022-11-24 DIAGNOSIS — M25472 Effusion, left ankle: Secondary | ICD-10-CM | POA: Diagnosis present

## 2022-11-24 MED ORDER — HYDROCODONE-ACETAMINOPHEN 5-325 MG PO TABS
1.0000 | ORAL_TABLET | Freq: Once | ORAL | Status: AC
  Administered 2022-11-24: 1 via ORAL
  Filled 2022-11-24: qty 1

## 2022-11-24 NOTE — ED Triage Notes (Addendum)
Pt in with pain and swelling to R foot x 2 wks. Pt states she stomped a bug 2 wks ago, and thinks she may have injured herself while doing so. Unable to stand on extremity, limited movement. Also has hx of gout

## 2022-11-24 NOTE — ED Provider Notes (Signed)
[facility]ERGENCY [facility] Emergency Department Provider Note MRN:  [id number]  Arrival date & time: 11/25/22     Chief Complaint   Ankle Pain and Foot Pain   History of Present Illness   Kayla Mcclure is a 77 y.o. year-old female with a history of CAD hypertension, stroke presenting to the ED with chief complaint of ankle pain.  Right lower leg, ankle, foot pain continuous for the past 2 weeks.  Started after trying to stomp on a bug.  Initially was not that bad but has been getting worse and worse.  Swelling around the ankle and foot.  No fever.  Review of Systems  A thorough review of systems was obtained and all systems are negative except as noted in the HPI and PMH.   Patient's Health History    Past Medical History:  Diagnosis Date   Acute GI bleeding 08/07/2019   Initially was in September 2017   Asthma 10/31/2007   CAD (coronary artery disease) 08/05/2007   a) NSTEMI 2009: PCI to the RCA Promus DES 2.5 mm x 23 mm (3.0 mm). b) Myoview June 2017: Nonspecific ST changes. EF greater than 65%. LOW RISK, normal study. No ischemia or infarction.   Carotid artery disease [facility])    s/p Bilateral CEA March & May 2020.    Chronic back pain    Chronic diastolic heart failure (HCC)    CKD (chronic kidney disease), stage III (HCC)    Diverticulosis 04/11/2010   Colonoscopy   Dyslipidemia, goal LDL below 70    Essential hypertension    a. Normal renal arteries by PV angio 05/2013.   GERD (gastroesophageal reflux disease)    History of kidney stones 1975   surgery to remove   History of recent blood transfusion 12/2015   12-19-15, 12-20-15 and 12-21-15   Insomnia    Internal hemorrhoids 04/11/2010   By colonoscopy   Iron deficiency anemia    Lung nodules 05/2021   bilateral   Myocardial infarction [facility]) 2009   Peripheral arterial disease (HCC)    a. s/p PTA and stenting of L CIA stenosis 05/2013.   Stroke [facility])    mini stroke   Tobacco abuse    Quit in  05/2013    Past Surgical History:  Procedure Laterality Date   BRONCHIAL BIOPSY  06/06/2021   Procedure: BRONCHIAL BIOPSIES;  Surgeon: Josephine Igo, DO;  Location: [facility] ENDOSCOPY;  Service: Pulmonary;;   BRONCHIAL BRUSHINGS  06/06/2021   Procedure: BRONCHIAL BRUSHINGS;  Surgeon: Josephine Igo, DO;  Location: [facility] ENDOSCOPY;  Service: Pulmonary;;   BRONCHIAL NEEDLE ASPIRATION BIOPSY  06/06/2021   Procedure: BRONCHIAL NEEDLE ASPIRATION BIOPSIES;  Surgeon: Josephine Igo, DO;  Location: [facility] ENDOSCOPY;  Service: Pulmonary;;   CAPSULE ENDOSCOPY  10/27/2019   [facility]) evidence of "pinhole leaking" in both stomach and proximal small bowel; no overt bleeding noted   CHOLECYSTECTOMY  1975   COLONOSCOPY  04/11/2010   COLONOSCOPY  11/30/2015   CORONARY ANGIOPLASTY WITH STENT PLACEMENT  08/05/2007   Promus DES 2.5 mm x 23 mm (3 mm)  to RCA; EF 60-70%   ENDARTERECTOMY Left 07/28/2019   Procedure: ENDARTERECTOMY CAROTID;  Surgeon: Maeola Harman, MD;  Location: [facility] OR;  Service: Vascular;  Laterality: Left;   ENDARTERECTOMY Right 08/27/2019   Procedure: ENDARTERECTOMY CAROTID RIGHT WITH PATCH ANGIOPLASTY;  Surgeon: Maeola Harman, MD;  Location: [facility] OR;  Service: Vascular;  Laterality: Right;   ENTEROSCOPY N/A 12/27/2015   Procedure: ENTEROSCOPY;  Surgeon: Vida Rigger, MD;  Location: [facility] ENDOSCOPY;  Service: Endoscopy;  Laterality: N/A;  also needs slim egd scope   ENTEROSCOPY N/A 05/15/2019   Procedure: ENTEROSCOPY;  Surgeon: Bernette Redbird, MD;  Location: [facility] ENDOSCOPY;  Service: Endoscopy;  Laterality: N/A;  Push enteroscopy using either ultraslim colonoscope or push enteroscope   ENTEROSCOPY N/A 08/08/2019   Procedure: ENTEROSCOPY;  Surgeon: Kerin Salen, MD;  Location: [facility] ENDOSCOPY;  Service: Gastroenterology;  Laterality: N/A;   ENTEROSCOPY N/A 03/22/2022   Procedure: ENTEROSCOPY;  Surgeon: Willis Modena, MD;  Location: [facility] ENDOSCOPY;  Service: Gastroenterology;  Laterality: N/A;    ESOPHAGOGASTRODUODENOSCOPY N/A 09/13/2013   Procedure: ESOPHAGOGASTRODUODENOSCOPY (EGD);  Surgeon: Shirley Friar, MD;  Location: [facility] ENDOSCOPY;  Service: Endoscopy;  Laterality: N/A;   ESOPHAGOGASTRODUODENOSCOPY N/A 07/14/2015   Procedure: ESOPHAGOGASTRODUODENOSCOPY (EGD);  Surgeon: Vida Rigger, MD;  Location: [facility] ENDOSCOPY;  Service: Endoscopy;  Laterality: N/A;   ESOPHAGOGASTRODUODENOSCOPY (EGD) WITH PROPOFOL N/A 09/09/2020   Procedure: ESOPHAGOGASTRODUODENOSCOPY (EGD) WITH PROPOFOL;  Surgeon: Vida Rigger, MD;  Location: [facility] ENDOSCOPY;  Service: Endoscopy;  Laterality: N/A;   FIDUCIAL MARKER PLACEMENT  06/06/2021   Procedure: FIDUCIAL MARKER PLACEMENT;  Surgeon: Josephine Igo, DO;  Location: [facility] ENDOSCOPY;  Service: Pulmonary;;   gi bleed  07/2015   blood given   GIVENS CAPSULE STUDY N/A 03/20/2022   Procedure: GIVENS CAPSULE STUDY;  Surgeon: Willis Modena, MD;  Location: [facility] ENDOSCOPY;  Service: Gastroenterology;  Laterality: N/A;   HOT HEMOSTASIS N/A 12/27/2015   Procedure: HOT HEMOSTASIS (ARGON PLASMA COAGULATION/BICAP);  Surgeon: Vida Rigger, MD;  Location: [facility] ENDOSCOPY;  Service: Endoscopy;  Laterality: N/A;   HOT HEMOSTASIS N/A 08/08/2019   Procedure: HOT HEMOSTASIS (ARGON PLASMA COAGULATION/BICAP);  Surgeon: Kerin Salen, MD;  Location: [facility] ENDOSCOPY;  Service: Gastroenterology;  Laterality: N/A;   HOT HEMOSTASIS N/A 09/09/2020   Procedure: HOT HEMOSTASIS (ARGON PLASMA COAGULATION/BICAP);  Surgeon: Vida Rigger, MD;  Location: [facility] ENDOSCOPY;  Service: Endoscopy;  Laterality: N/A;   ILIAC ARTERY STENT Left 05/04/2013   L CIA-EIA -- Dr. Allyson Sabal   LEFT HEART CATH AND CORONARY ANGIOGRAPHY N/A 02/11/2019   Procedure: LEFT HEART CATH AND CORONARY ANGIOGRAPHY;  Surgeon: Marykay Lex, MD;  Location: [facility] INVASIVE CV LAB;;-  Patent RCA stent with minimal ISR after pRCA 50%).  Small caliber RPAV with ~75%.  pLAD 30%, m-dLAD 40%, D2 40%.  Normal LVEDP and LVEF.   LOWER EXTREMITY ANGIOGRAM Bilateral 05/04/2013    Procedure: LOWER EXTREMITY ANGIOGRAM;  Surgeon: Runell Gess, MD;  Location: [facility] CATH LAB;  Service: Cardiovascular;  Laterality: Bilateral;   NM MYOVIEW LTD  09/07/2015   EF > 65%. Nonspecific ST changes but no ischemic changes. No ischemia or infarction. LOW RISK   PATCH ANGIOPLASTY Left 07/28/2019   Procedure: PATCH ANGIOPLASTY USING Livia Snellen BIOLOGIC PATCH;  Surgeon: Maeola Harman, MD;  Location: [facility] OR;  Service: Vascular;  Laterality: Left;   RENAL ANGIOGRAPHY N/A 01/15/2022   Procedure: RENAL ANGIOGRAPHY;  Surgeon: Maeola Harman, MD;  Location: [facility] INVASIVE CV LAB;  Service: Cardiovascular;  Laterality: N/A;   TONSILLECTOMY  1960's   TRANSTHORACIC ECHOCARDIOGRAM  05/14/2019   Normal EF 60 to 65%.  No R WMA.   Normal RV but mildly elevated pressures.  Relatively normal aortic and mitral valves.   UPPER GI ENDOSCOPY  12/14/2015   VIDEO BRONCHOSCOPY WITH RADIAL ENDOBRONCHIAL ULTRASOUND  06/06/2021   Procedure: RADIAL ENDOBRONCHIAL ULTRASOUND;  Surgeon: Josephine Igo, DO;  Location: [facility] ENDOSCOPY;  Service: Pulmonary;;  Family History  Problem Relation Age of Onset   Diabetes Mother    Heart disease Mother    Heart attack Mother 47   Breast cancer Mother 57   Colon cancer Mother 70   Heart attack Father 21   Pneumonia Father    Diabetes Sister    Breast cancer Sister    Breast cancer Sister 36    Social History   Socioeconomic History   Marital status: Married    Spouse name: Not on file   Number of children: 1   Years of education: 12   Highest education level: High school graduate  Occupational History    Employer: RETIRED  Tobacco Use   Smoking status: Some Days    Current packs/day: 0.00    Types: Cigarettes    Last attempt to quit: 05/04/2013    Years since quitting: 9.5    Passive exposure: Never   Smokeless tobacco: Never   Tobacco comments:    Patient states "I smoke 1-2 about every two to three days. 10/12/22 Tay  Vaping Use    Vaping status: Never Used  Substance and Sexual Activity   Alcohol use: Yes    Comment: occasionally 3 times a year   Drug use: No   Sexual activity: Not Currently    Partners: Male    Birth control/protection: None, Post-menopausal  Other Topics Concern   Not on file  Social History Narrative   Married. Has one child. Grandmother for a great-grandmother and 1.   No real exercise.   Occasional alcohol consumption.   Quit smoking inf Feb 2015 -- after L Iliac Stent   Right-handed   Caffeine: 1 cup every morning   Right-handed.   Lives at home with husband.   Social Determinants of Health   Financial Resource Strain: Not on file  Food Insecurity: No Food Insecurity (03/20/2022)   Hunger Vital Sign    Worried About Running Out of Food in the Last Year: Never true    Ran Out of Food in the Last Year: Never true  Transportation Needs: No Transportation Needs (03/20/2022)   PRAPARE - Administrator, Civil Service (Medical): No    Lack of Transportation (Non-Medical): No  Physical Activity: Not on file  Stress: Not on file  Social Connections: Not on file  Intimate Partner Violence: Not At Risk (03/20/2022)   Humiliation, Afraid, Rape, and Kick questionnaire    Fear of Current or Ex-Partner: No    Emotionally Abused: No    Physically Abused: No    Sexually Abused: No     Physical Exam   Vitals:   11/25/22 0400 11/25/22 0453  BP:  (!) 168/69  Pulse:  90  Resp:  18  Temp: 98.8 F (37.1 C) 98.3 F (36.8 C)  SpO2:  92%    CONSTITUTIONAL: Well-appearing, NAD NEURO/PSYCH:  Alert and oriented x 3, no focal deficits EYES:  eyes equal and reactive ENT/NECK:  no LAD, no JVD CARDIO: Regular rate, well-perfused, normal S1 and S2 PULM:  CTAB no wheezing or rhonchi GI/GU:  non-distended, non-tender MSK/SPINE:  No gross deformities, no edema SKIN:  no rash, atraumatic   *Additional and/or pertinent findings included in MDM below  Diagnostic and Interventional  Summary    EKG Interpretation Date/Time:    Ventricular Rate:    PR Interval:    QRS Duration:    QT Interval:    QTC Calculation:   R Axis:      Text  Interpretation:         Labs Reviewed  CBC - Abnormal; Notable for the following components:      Result Value   RDW 17.0 (*)    All other components within normal limits  BASIC METABOLIC PANEL - Abnormal; Notable for the following components:   Creatinine, Ser 1.30 (*)    GFR, Estimated 43 (*)    All other components within normal limits  D-DIMER, QUANTITATIVE - Abnormal; Notable for the following components:   D-Dimer, Quant 0.75 (*)    All other components within normal limits  BRAIN NATRIURETIC PEPTIDE - Abnormal; Notable for the following components:   B Natriuretic Peptide 472.1 (*)    All other components within normal limits  BASIC METABOLIC PANEL  CBC WITH DIFFERENTIAL/PLATELET  TROPONIN I (HIGH SENSITIVITY)  TROPONIN I (HIGH SENSITIVITY)    CT Angio Chest Pulmonary Embolism (PE) W or WO Contrast  Final Result    DG Ankle Complete Right  Final Result    DG Tibia/Fibula Right  Final Result    DG Foot Complete Right  Final Result    US Venous Img Lower Unilateral Right    (Results Pending)    Medications  albuterol (PROVENTIL) (2.5 MG/3ML) 0.083% nebulizer solution 2.5 mg (has no administration in time range)  oxyCODONE (Oxy IR/ROXICODONE) immediate release tablet 5 mg (has no administration in time range)  acetaminophen (TYLENOL) tablet 650 mg (has no administration in time range)  ondansetron (ZOFRAN) injection 4 mg (has no administration in time range)  HYDROcodone-acetaminophen (NORCO/VICODIN) 5-325 MG per tablet 1 tablet (1 tablet Oral Given 11/24/22 2318)  ipratropium-albuterol (DUONEB) 0.5-2.5 (3) MG/3ML nebulizer solution 3 mL (3 mLs Nebulization Given 11/25/22 0032)  iohexol (OMNIPAQUE) 350 MG/ML injection 100 mL (60 mLs Intravenous Contrast Given 11/25/22 0201)     Procedures  /  Critical  Care Procedures  ED Course and Medical Decision Making  Initial Impression and Ddx Persistent pain after mild trauma 2 weeks ago.  Question fracture, sprain, could be trauma causing a gout flare, also considering DVT.  Neurovascularly intact.  Range of motion of the ankle is limited due to the pain.  Overall very low concern for septic joint, no fever, no significant redness or increased warmth.  Past medical/surgical history that increases complexity of ED encounter: CAD  Interpretation of Diagnostics I personally reviewed the EKG and my interpretation is as follows: Sinus rhythm  X-rays without fracture.  Patient Reassessment and Ultimate Disposition/Management Clinical Course as of 11/25/22 0981  Wynelle Link Nov 25, 2022  0101 Patient now complaining of some shortness of breath.  Started on a breathing treatment by respiratory therapy.  On my exam she has no wheezing, her oxygen saturation is 85%.  She does not appear to be in significant distress.  This raises concern for possible PE.  Awaiting labs, CTA. [MB]    Clinical Course User Index [MB] Sabas Sous, MD     PE study negative, known nodule or mass unchanged.  Patient now requiring 2 L nasal cannula.  When taken off she goes down to 81% on room air.  Does not normally wear oxygen at home, will request admission for further testing.  Patient management required discussion with the following services or consulting groups:  Hospitalist Service  Complexity of Problems Addressed Acute illness or injury that poses threat of life of bodily function  Additional Data Reviewed and Analyzed Further history obtained from: Further history from spouse/family member  Additional Factors Impacting ED Encounter  Risk Consideration of hospitalization  Elmer Sow. Pilar Plate, MD [facility] Emergency Medicine [facility] [email or web address]  Final Clinical Impressions(s) / ED Diagnoses     ICD-10-CM   1. Pain of right lower  extremity  M79.604       ED Discharge Orders          Ordered    US Venous Img Lower Unilateral Right        11/25/22 0251             Discharge Instructions Discussed with and Provided to Patient:     Discharge Instructions      You were evaluated in the Emergency Department and after careful evaluation, we did not find any emergent condition requiring admission or further testing in the hospital.  Your exam/testing today was overall reassuring.  Recommend returning to [facility] for ultrasound of your leg as we discussed.  Please return to the Emergency Department if you experience any worsening of your condition.  Thank you for allowing [place] to be a part of your care.        Sabas Sous, MD 11/25/22 [phone]

## 2022-11-25 ENCOUNTER — Encounter (HOSPITAL_COMMUNITY): Payer: Self-pay | Admitting: Family Medicine

## 2022-11-25 ENCOUNTER — Observation Stay (HOSPITAL_COMMUNITY): Payer: Medicare Other

## 2022-11-25 ENCOUNTER — Observation Stay (HOSPITAL_BASED_OUTPATIENT_CLINIC_OR_DEPARTMENT_OTHER): Payer: Medicare Other

## 2022-11-25 ENCOUNTER — Emergency Department (HOSPITAL_BASED_OUTPATIENT_CLINIC_OR_DEPARTMENT_OTHER): Payer: Medicare Other

## 2022-11-25 DIAGNOSIS — R222 Localized swelling, mass and lump, trunk: Secondary | ICD-10-CM | POA: Diagnosis not present

## 2022-11-25 DIAGNOSIS — M25571 Pain in right ankle and joints of right foot: Secondary | ICD-10-CM | POA: Diagnosis present

## 2022-11-25 DIAGNOSIS — J9601 Acute respiratory failure with hypoxia: Secondary | ICD-10-CM | POA: Diagnosis present

## 2022-11-25 DIAGNOSIS — I5032 Chronic diastolic (congestive) heart failure: Secondary | ICD-10-CM | POA: Diagnosis not present

## 2022-11-25 DIAGNOSIS — J45901 Unspecified asthma with (acute) exacerbation: Secondary | ICD-10-CM | POA: Diagnosis not present

## 2022-11-25 DIAGNOSIS — I700 Atherosclerosis of aorta: Secondary | ICD-10-CM | POA: Diagnosis not present

## 2022-11-25 DIAGNOSIS — R0902 Hypoxemia: Secondary | ICD-10-CM | POA: Diagnosis not present

## 2022-11-25 DIAGNOSIS — M19012 Primary osteoarthritis, left shoulder: Secondary | ICD-10-CM | POA: Diagnosis not present

## 2022-11-25 DIAGNOSIS — M25512 Pain in left shoulder: Secondary | ICD-10-CM | POA: Diagnosis not present

## 2022-11-25 DIAGNOSIS — M25519 Pain in unspecified shoulder: Secondary | ICD-10-CM | POA: Diagnosis not present

## 2022-11-25 DIAGNOSIS — I2510 Atherosclerotic heart disease of native coronary artery without angina pectoris: Secondary | ICD-10-CM | POA: Diagnosis not present

## 2022-11-25 DIAGNOSIS — J441 Chronic obstructive pulmonary disease with (acute) exacerbation: Secondary | ICD-10-CM | POA: Diagnosis not present

## 2022-11-25 DIAGNOSIS — M7989 Other specified soft tissue disorders: Secondary | ICD-10-CM | POA: Diagnosis not present

## 2022-11-25 DIAGNOSIS — Z79899 Other long term (current) drug therapy: Secondary | ICD-10-CM | POA: Diagnosis not present

## 2022-11-25 DIAGNOSIS — Z720 Tobacco use: Secondary | ICD-10-CM | POA: Diagnosis present

## 2022-11-25 DIAGNOSIS — Z8673 Personal history of transient ischemic attack (TIA), and cerebral infarction without residual deficits: Secondary | ICD-10-CM | POA: Diagnosis not present

## 2022-11-25 DIAGNOSIS — M25472 Effusion, left ankle: Secondary | ICD-10-CM | POA: Diagnosis present

## 2022-11-25 DIAGNOSIS — Z87891 Personal history of nicotine dependence: Secondary | ICD-10-CM | POA: Diagnosis not present

## 2022-11-25 DIAGNOSIS — I130 Hypertensive heart and chronic kidney disease with heart failure and stage 1 through stage 4 chronic kidney disease, or unspecified chronic kidney disease: Secondary | ICD-10-CM | POA: Diagnosis not present

## 2022-11-25 DIAGNOSIS — N183 Chronic kidney disease, stage 3 unspecified: Secondary | ICD-10-CM | POA: Diagnosis not present

## 2022-11-25 DIAGNOSIS — D500 Iron deficiency anemia secondary to blood loss (chronic): Secondary | ICD-10-CM | POA: Diagnosis not present

## 2022-11-25 LAB — BASIC METABOLIC PANEL
Anion gap: 8 (ref 5–15)
BUN: 23 mg/dL (ref 8–23)
CO2: 25 mmol/L (ref 22–32)
Calcium: 9.3 mg/dL (ref 8.9–10.3)
Chloride: 106 mmol/L (ref 98–111)
Creatinine, Ser: 1.3 mg/dL — ABNORMAL HIGH (ref 0.44–1.00)
GFR, Estimated: 43 mL/min — ABNORMAL LOW (ref >60)
Glucose, Bld: 98 mg/dL (ref 70–99)
Potassium: 4.3 mmol/L (ref 3.5–5.1)
Sodium: 139 mmol/L (ref 135–145)

## 2022-11-25 LAB — CBC WITH DIFFERENTIAL/PLATELET
Abs Immature Granulocytes: 0.02 10*3/uL (ref 0.00–0.07)
Basophils Absolute: 0 10*3/uL (ref 0.0–0.1)
Basophils Relative: 1 %
Eosinophils Absolute: 0.2 10*3/uL (ref 0.0–0.5)
Eosinophils Relative: 3 %
HCT: 37.3 % (ref 36.0–46.0)
Hemoglobin: 11.4 g/dL — ABNORMAL LOW (ref 12.0–15.0)
Immature Granulocytes: 0 %
Lymphocytes Relative: 28 %
Lymphs Abs: 1.6 10*3/uL (ref 0.7–4.0)
MCH: 27.7 pg (ref 26.0–34.0)
MCHC: 30.6 g/dL (ref 30.0–36.0)
MCV: 90.5 fL (ref 80.0–100.0)
Monocytes Absolute: 0.7 10*3/uL (ref 0.1–1.0)
Monocytes Relative: 12 %
Neutro Abs: 3.2 10*3/uL (ref 1.7–7.7)
Neutrophils Relative %: 56 %
Platelets: 174 10*3/uL (ref 150–400)
RBC: 4.12 MIL/uL (ref 3.87–5.11)
RDW: 17.2 % — ABNORMAL HIGH (ref 11.5–15.5)
WBC: 5.7 10*3/uL (ref 4.0–10.5)
nRBC: 0 % (ref 0.0–0.2)

## 2022-11-25 LAB — CBC
HCT: 38 % (ref 36.0–46.0)
Hemoglobin: 12.1 g/dL (ref 12.0–15.0)
MCH: 27.4 pg (ref 26.0–34.0)
MCHC: 31.8 g/dL (ref 30.0–36.0)
MCV: 86.2 fL (ref 80.0–100.0)
Platelets: 195 10*3/uL (ref 150–400)
RBC: 4.41 MIL/uL (ref 3.87–5.11)
RDW: 17 % — ABNORMAL HIGH (ref 11.5–15.5)
WBC: 9.2 10*3/uL (ref 4.0–10.5)
nRBC: 0 % (ref 0.0–0.2)

## 2022-11-25 LAB — TROPONIN I (HIGH SENSITIVITY)
Troponin I (High Sensitivity): 10 ng/L (ref <18)
Troponin I (High Sensitivity): 8 ng/L (ref <18)

## 2022-11-25 LAB — BRAIN NATRIURETIC PEPTIDE: B Natriuretic Peptide: 472.1 pg/mL — ABNORMAL HIGH (ref 0.0–100.0)

## 2022-11-25 LAB — D-DIMER, QUANTITATIVE: D-Dimer, Quant: 0.75 ug{FEU}/mL — ABNORMAL HIGH (ref 0.00–0.50)

## 2022-11-25 MED ORDER — PANTOPRAZOLE SODIUM 40 MG PO TBEC
40.0000 mg | DELAYED_RELEASE_TABLET | Freq: Two times a day (BID) | ORAL | Status: DC
  Administered 2022-11-25 – 2022-11-26 (×3): 40 mg via ORAL
  Filled 2022-11-25 (×3): qty 1

## 2022-11-25 MED ORDER — ENOXAPARIN SODIUM 30 MG/0.3ML IJ SOSY
30.0000 mg | PREFILLED_SYRINGE | INTRAMUSCULAR | Status: DC
  Administered 2022-11-25: 30 mg via SUBCUTANEOUS
  Filled 2022-11-25: qty 0.3

## 2022-11-25 MED ORDER — ALBUTEROL SULFATE (2.5 MG/3ML) 0.083% IN NEBU: 2.5000 mg | INHALATION_SOLUTION | RESPIRATORY_TRACT | Status: DC | PRN

## 2022-11-25 MED ORDER — IOHEXOL 350 MG/ML SOLN
100.0000 mL | Freq: Once | INTRAVENOUS | Status: AC | PRN
  Administered 2022-11-25: 60 mL via INTRAVENOUS

## 2022-11-25 MED ORDER — EZETIMIBE 10 MG PO TABS
10.0000 mg | ORAL_TABLET | Freq: Every day | ORAL | Status: DC
  Administered 2022-11-25 – 2022-11-26 (×2): 10 mg via ORAL
  Filled 2022-11-25 (×2): qty 1

## 2022-11-25 MED ORDER — METOPROLOL SUCCINATE ER 50 MG PO TB24
50.0000 mg | ORAL_TABLET | Freq: Every day | ORAL | Status: DC
  Administered 2022-11-25: 50 mg via ORAL
  Filled 2022-11-25: qty 1

## 2022-11-25 MED ORDER — OXYCODONE HCL 5 MG PO TABS
5.0000 mg | ORAL_TABLET | ORAL | Status: DC | PRN
  Administered 2022-11-25: 5 mg via ORAL
  Filled 2022-11-25: qty 1

## 2022-11-25 MED ORDER — BUDESONIDE 0.5 MG/2ML IN SUSP
0.5000 mg | Freq: Two times a day (BID) | RESPIRATORY_TRACT | Status: DC
  Administered 2022-11-25 – 2022-11-26 (×3): 0.5 mg via RESPIRATORY_TRACT
  Filled 2022-11-25 (×3): qty 2

## 2022-11-25 MED ORDER — OXYCODONE HCL 5 MG PO TABS: 5.0000 mg | ORAL_TABLET | ORAL | Status: DC | PRN

## 2022-11-25 MED ORDER — FERROUS GLUCONATE 324 (38 FE) MG PO TABS
324.0000 mg | ORAL_TABLET | Freq: Every day | ORAL | Status: DC
  Administered 2022-11-25 – 2022-11-26 (×2): 324 mg via ORAL
  Filled 2022-11-25 (×2): qty 1

## 2022-11-25 MED ORDER — NICOTINE 14 MG/24HR TD PT24: 14.0000 mg | MEDICATED_PATCH | Freq: Every day | TRANSDERMAL | Status: DC | PRN

## 2022-11-25 MED ORDER — IPRATROPIUM-ALBUTEROL 0.5-2.5 (3) MG/3ML IN SOLN: 3.0000 mL | Freq: Once | RESPIRATORY_TRACT | Status: AC

## 2022-11-25 MED ORDER — ACETAMINOPHEN 325 MG PO TABS: 650.0000 mg | ORAL_TABLET | Freq: Four times a day (QID) | ORAL | Status: DC | PRN

## 2022-11-25 MED ORDER — COLCHICINE 0.6 MG PO TABS: 1.2000 mg | ORAL_TABLET | Freq: Every day | ORAL | Status: DC

## 2022-11-25 MED ORDER — IPRATROPIUM-ALBUTEROL 0.5-2.5 (3) MG/3ML IN SOLN
RESPIRATORY_TRACT | Status: AC
  Administered 2022-11-25: 3 mL via RESPIRATORY_TRACT
  Filled 2022-11-25: qty 3

## 2022-11-25 MED ORDER — ONDANSETRON HCL 4 MG/2ML IJ SOLN: 4.0000 mg | Freq: Four times a day (QID) | INTRAMUSCULAR | Status: DC | PRN

## 2022-11-25 MED ORDER — ASPIRIN 81 MG PO TBEC
81.0000 mg | DELAYED_RELEASE_TABLET | Freq: Every day | ORAL | Status: DC
  Administered 2022-11-25 – 2022-11-26 (×2): 81 mg via ORAL
  Filled 2022-11-25 (×2): qty 1

## 2022-11-25 MED ORDER — ACETAMINOPHEN 650 MG RE SUPP: 650.0000 mg | Freq: Four times a day (QID) | RECTAL | Status: DC | PRN

## 2022-11-25 MED ORDER — ALLOPURINOL 100 MG PO TABS
100.0000 mg | ORAL_TABLET | Freq: Every day | ORAL | Status: DC
  Administered 2022-11-25 – 2022-11-26 (×2): 100 mg via ORAL
  Filled 2022-11-25 (×2): qty 1

## 2022-11-25 MED ORDER — ATORVASTATIN CALCIUM 40 MG PO TABS
80.0000 mg | ORAL_TABLET | Freq: Every day | ORAL | Status: DC
  Administered 2022-11-25 – 2022-11-26 (×2): 80 mg via ORAL
  Filled 2022-11-25 (×2): qty 2

## 2022-11-25 NOTE — Discharge Instructions (Signed)
You were evaluated in the Emergency Department and after careful evaluation, we did not find any emergent condition requiring admission or further testing in the hospital.  Your exam/testing today was overall reassuring.  Recommend returning to [facility] for ultrasound of your leg as we discussed.  Please return to the Emergency Department if you experience any worsening of your condition.  Thank you for allowing [place] to be a part of your care.

## 2022-11-25 NOTE — Plan of Care (Signed)
  Problem: Education: Goal: Knowledge of General Education information will improve Description: Including pain rating scale, medication(s)/side effects and non-pharmacologic comfort measures Outcome: Progressing   Problem: Clinical Measurements: Goal: Will remain free from infection Outcome: Progressing   Problem: Clinical Measurements: Goal: Diagnostic test results will improve Outcome: Progressing   Problem: Clinical Measurements: Goal: Respiratory complications will improve Outcome: Progressing   Problem: Clinical Measurements: Goal: Cardiovascular complication will be avoided Outcome: Progressing   Problem: Activity: Goal: Risk for activity intolerance will decrease Outcome: Progressing   Problem: Safety: Goal: Ability to remain free from injury will improve Outcome: Progressing   Problem: Pain Managment: Goal: General experience of comfort will improve Outcome: Progressing

## 2022-11-25 NOTE — ED Notes (Signed)
Patient's O2 in the mid 80's, denies using home O2, states she has asthma and feels like she may need an inhaler. RT notified and will evaluate pt.

## 2022-11-25 NOTE — ED Notes (Signed)
Report called to Selena Batten, RN, patient awaiting [facility] for transport, ETA 10 min.

## 2022-11-25 NOTE — Plan of Care (Signed)

## 2022-11-25 NOTE — Progress Notes (Signed)
Bilateral lower extremity venous duplex has been completed. Preliminary results can be found in CV Proc through chart review.   11/25/22 11:07 AM Olen Cordial RVT

## 2022-11-25 NOTE — H&P (Signed)
History and Physical    Patient: Kayla Mcclure BJY:[id number] DOB: 01-Jul-1945 DOA: 11/24/2022 DOS: the patient was seen and examined on 11/25/2022 PCP: Wilfrid Lund, PA  Patient coming from: Home  Chief Complaint:  Chief Complaint  Patient presents with   Ankle Pain   Foot Pain   HPI: Kayla Mcclure is a 77 y.o. female with medical history significant of GI bleeding, CAD, NSTEMI, chronic diastolic heart failure, essential hypertension, peripheral arterial disease, history of TIA, coronary artery disease, chronic back pain, GERD, diverticulosis, nephrolithiasis, iron deficiency anemia, bilateral lung nodules, tobacco abuse who presented to the emergency department with complaints of bilateral feet swelling for the past 2 weeks.  The patient stated that she tried to step on a bug with her right foot hitting the floor very hard.  The foot started getting swollen and painful next day.  She has also been having dyspnea and wheezing. He denied fever, chills, rhinorrhea, sore throat, wheezing or hemoptysis.  No chest pain, palpitations, diaphoresis, PND, orthopnea or pitting edema of the lower extremities.  No abdominal pain, nausea, emesis, diarrhea, constipation, melena or hematochezia.  No flank pain, dysuria, frequency or hematuria.  No polyuria, polydipsia, polyphagia or blurred vision.   ED course: Initial vital signs were temperature 98.8 F, pulse 88, respiration 20, BP 165/81 mmHg O2 sat 94% on room air.  The patient received hydrocodone/acetaminophen 5/325 mg tablet p.o. x 1 and a DuoNeb.  Lab work: Her CBC showed a white count of 9.2, hemoglobin 12.1 g/dL platelets 086.  D-dimer 0.75.  Troponin x 2 normal.  BNP 472.1 pg/mL.  BMP showed a creatinine of 1.30 mg deciliter, the rest of the BMP was normal.  Imaging: Right foot x-ray with no acute fracture or dislocation.  There was soft tissue swelling.  Right ankle x-ray also showing diffuse subcutaneous edema without fracture or  dislocation.  Right tibia/fibula with no acute fracture or dislocation.  Diffuse subcutaneous edema.  CTA chest with no evidence of pulmonary emboli.  There is bilateral upper lobe nodular densities similar to what seen on prior exams.  Again the lesion in the right upper lobe is suspicious for underlying carcinoma.  Aortic atherosclerosis.   Review of Systems: As mentioned in the history of present illness. All other systems reviewed and are negative. Past Medical History:  Diagnosis Date   Acute GI bleeding 08/07/2019   Initially was in September 2017   Asthma 10/31/2007   CAD (coronary artery disease) 08/05/2007   a) NSTEMI 2009: PCI to the RCA Promus DES 2.5 mm x 23 mm (3.0 mm). b) Myoview June 2017: Nonspecific ST changes. EF greater than 65%. LOW RISK, normal study. No ischemia or infarction.   Carotid artery disease [facility])    s/p Bilateral CEA March & May 2020.    Chronic back pain    Chronic diastolic heart failure (HCC)    CKD (chronic kidney disease), stage III (HCC)    Diverticulosis 04/11/2010   Colonoscopy   Dyslipidemia, goal LDL below 70    Essential hypertension    a. Normal renal arteries by PV angio 05/2013.   GERD (gastroesophageal reflux disease)    History of kidney stones 1975   surgery to remove   History of recent blood transfusion 12/2015   12-19-15, 12-20-15 and 12-21-15   Insomnia    Internal hemorrhoids 04/11/2010   By colonoscopy   Iron deficiency anemia    Lung nodules 05/2021   bilateral   Myocardial infarction [facility])  2009   Peripheral arterial disease (HCC)    a. s/p PTA and stenting of L CIA stenosis 05/2013.   Stroke [facility])    mini stroke   Tobacco abuse    Quit in 05/2013   Past Surgical History:  Procedure Laterality Date   BRONCHIAL BIOPSY  06/06/2021   Procedure: BRONCHIAL BIOPSIES;  Surgeon: Josephine Igo, DO;  Location: [facility] ENDOSCOPY;  Service: Pulmonary;;   BRONCHIAL BRUSHINGS  06/06/2021   Procedure: BRONCHIAL BRUSHINGS;  Surgeon: Josephine Igo, DO;  Location: [facility] ENDOSCOPY;  Service: Pulmonary;;   BRONCHIAL NEEDLE ASPIRATION BIOPSY  06/06/2021   Procedure: BRONCHIAL NEEDLE ASPIRATION BIOPSIES;  Surgeon: Josephine Igo, DO;  Location: [facility] ENDOSCOPY;  Service: Pulmonary;;   CAPSULE ENDOSCOPY  10/27/2019   [facility]) evidence of "pinhole leaking" in both stomach and proximal small bowel; no overt bleeding noted   CHOLECYSTECTOMY  1975   COLONOSCOPY  04/11/2010   COLONOSCOPY  11/30/2015   CORONARY ANGIOPLASTY WITH STENT PLACEMENT  08/05/2007   Promus DES 2.5 mm x 23 mm (3 mm)  to RCA; EF 60-70%   ENDARTERECTOMY Left 07/28/2019   Procedure: ENDARTERECTOMY CAROTID;  Surgeon: Maeola Harman, MD;  Location: [facility] OR;  Service: Vascular;  Laterality: Left;   ENDARTERECTOMY Right 08/27/2019   Procedure: ENDARTERECTOMY CAROTID RIGHT WITH PATCH ANGIOPLASTY;  Surgeon: Maeola Harman, MD;  Location: [facility] OR;  Service: Vascular;  Laterality: Right;   ENTEROSCOPY N/A 12/27/2015   Procedure: ENTEROSCOPY;  Surgeon: Vida Rigger, MD;  Location: [facility] ENDOSCOPY;  Service: Endoscopy;  Laterality: N/A;  also needs slim egd scope   ENTEROSCOPY N/A 05/15/2019   Procedure: ENTEROSCOPY;  Surgeon: Bernette Redbird, MD;  Location: [facility] ENDOSCOPY;  Service: Endoscopy;  Laterality: N/A;  Push enteroscopy using either ultraslim colonoscope or push enteroscope   ENTEROSCOPY N/A 08/08/2019   Procedure: ENTEROSCOPY;  Surgeon: Kerin Salen, MD;  Location: [facility] ENDOSCOPY;  Service: Gastroenterology;  Laterality: N/A;   ENTEROSCOPY N/A 03/22/2022   Procedure: ENTEROSCOPY;  Surgeon: Willis Modena, MD;  Location: [facility] ENDOSCOPY;  Service: Gastroenterology;  Laterality: N/A;   ESOPHAGOGASTRODUODENOSCOPY N/A 09/13/2013   Procedure: ESOPHAGOGASTRODUODENOSCOPY (EGD);  Surgeon: Shirley Friar, MD;  Location: [facility] ENDOSCOPY;  Service: Endoscopy;  Laterality: N/A;   ESOPHAGOGASTRODUODENOSCOPY N/A 07/14/2015   Procedure: ESOPHAGOGASTRODUODENOSCOPY (EGD);  Surgeon: Vida Rigger, MD;  Location: [facility] ENDOSCOPY;  Service: Endoscopy;  Laterality: N/A;   ESOPHAGOGASTRODUODENOSCOPY (EGD) WITH PROPOFOL N/A 09/09/2020   Procedure: ESOPHAGOGASTRODUODENOSCOPY (EGD) WITH PROPOFOL;  Surgeon: Vida Rigger, MD;  Location: [facility] ENDOSCOPY;  Service: Endoscopy;  Laterality: N/A;   FIDUCIAL MARKER PLACEMENT  06/06/2021   Procedure: FIDUCIAL MARKER PLACEMENT;  Surgeon: Josephine Igo, DO;  Location: [facility] ENDOSCOPY;  Service: Pulmonary;;   gi bleed  07/2015   blood given   GIVENS CAPSULE STUDY N/A 03/20/2022   Procedure: GIVENS CAPSULE STUDY;  Surgeon: Willis Modena, MD;  Location: [facility] ENDOSCOPY;  Service: Gastroenterology;  Laterality: N/A;   HOT HEMOSTASIS N/A 12/27/2015   Procedure: HOT HEMOSTASIS (ARGON PLASMA COAGULATION/BICAP);  Surgeon: Vida Rigger, MD;  Location: [facility] ENDOSCOPY;  Service: Endoscopy;  Laterality: N/A;   HOT HEMOSTASIS N/A 08/08/2019   Procedure: HOT HEMOSTASIS (ARGON PLASMA COAGULATION/BICAP);  Surgeon: Kerin Salen, MD;  Location: [facility] ENDOSCOPY;  Service: Gastroenterology;  Laterality: N/A;   HOT HEMOSTASIS N/A 09/09/2020   Procedure: HOT HEMOSTASIS (ARGON PLASMA COAGULATION/BICAP);  Surgeon: Vida Rigger, MD;  Location: [facility] ENDOSCOPY;  Service: Endoscopy;  Laterality: N/A;   ILIAC ARTERY STENT Left 05/04/2013   L  CIA-EIA -- Dr. Allyson Sabal   LEFT HEART CATH AND CORONARY ANGIOGRAPHY N/A 02/11/2019   Procedure: LEFT HEART CATH AND CORONARY ANGIOGRAPHY;  Surgeon: Marykay Lex, MD;  Location: [facility] INVASIVE CV LAB;;-  Patent RCA stent with minimal ISR after pRCA 50%).  Small caliber RPAV with ~75%.  pLAD 30%, m-dLAD 40%, D2 40%.  Normal LVEDP and LVEF.   LOWER EXTREMITY ANGIOGRAM Bilateral 05/04/2013   Procedure: LOWER EXTREMITY ANGIOGRAM;  Surgeon: Runell Gess, MD;  Location: [facility] CATH LAB;  Service: Cardiovascular;  Laterality: Bilateral;   NM MYOVIEW LTD  09/07/2015   EF > 65%. Nonspecific ST changes but no ischemic changes. No ischemia or infarction. LOW RISK   PATCH ANGIOPLASTY  Left 07/28/2019   Procedure: PATCH ANGIOPLASTY USING Livia Snellen BIOLOGIC PATCH;  Surgeon: Maeola Harman, MD;  Location: [facility] OR;  Service: Vascular;  Laterality: Left;   RENAL ANGIOGRAPHY N/A 01/15/2022   Procedure: RENAL ANGIOGRAPHY;  Surgeon: Maeola Harman, MD;  Location: [facility] INVASIVE CV LAB;  Service: Cardiovascular;  Laterality: N/A;   TONSILLECTOMY  1960's   TRANSTHORACIC ECHOCARDIOGRAM  05/14/2019   Normal EF 60 to 65%.  No R WMA.   Normal RV but mildly elevated pressures.  Relatively normal aortic and mitral valves.   UPPER GI ENDOSCOPY  12/14/2015   VIDEO BRONCHOSCOPY WITH RADIAL ENDOBRONCHIAL ULTRASOUND  06/06/2021   Procedure: RADIAL ENDOBRONCHIAL ULTRASOUND;  Surgeon: Josephine Igo, DO;  Location: [facility] ENDOSCOPY;  Service: Pulmonary;;   Social History:  reports that she has been smoking cigarettes. She has never been exposed to tobacco smoke. She has never used smokeless tobacco. She reports current alcohol use. She reports that she does not use drugs.  Allergies  Allergen Reactions   Diclofenac Sodium     Other Reaction(s): History of GI bleeds   Nsaids     Other Reaction(s): History of GI bleeds   Prednisone     Other Reaction(s): History of GI bleeds    Family History  Problem Relation Age of Onset   Diabetes Mother    Heart disease Mother    Heart attack Mother 48   Breast cancer Mother 36   Colon cancer Mother 40   Heart attack Father 69   Pneumonia Father    Diabetes Sister    Breast cancer Sister    Breast cancer Sister 49    Prior to Admission medications   Medication Sig Start Date End Date Taking? Authorizing Provider  acetaminophen (TYLENOL) 325 MG tablet Take 650 mg by mouth every 6 (six) hours as needed for mild pain or moderate pain.   Yes [provider]  albuterol (VENTOLIN HFA) 108 (90 Base) MCG/ACT inhaler Inhale 2 puffs into the lungs every 6 (six) hours as needed for wheezing or shortness of breath.   Yes [provider]  alendronate (FOSAMAX) 70 MG tablet TAKE 1 TABLET BY MOUTH WEEKLY  WITH 8 OZ OF PLAIN WATER 30  MINUTES BEFORE FIRST FOOD, DRINK OR MEDS. STAY UPRIGHT FOR 30  MINS 06/21/22  Yes Marykay Lex, MD  allopurinol (ZYLOPRIM) 100 MG tablet Take 100 mg by mouth daily. 08/16/22  Yes [provider]  aspirin EC 81 MG tablet Take 1 tablet (81 mg total) by mouth daily. 09/12/20  Yes Tyrone Nine, MD  atorvastatin (LIPITOR) 80 MG tablet Take 1 tablet (80 mg total) by mouth daily. 05/27/19  Yes Jodelle Gross, NP  colchicine 0.6 MG tablet Take 1.2mg  (2 tablets) then 0.6mg  (1  tablet) 1 hour after 08/14/22  Yes McDonald, Rachelle Hora, DPM  ezetimibe (ZETIA) 10 MG tablet Take 1 tablet (10 mg total) by mouth daily. 05/27/19  Yes Jodelle Gross, NP  ferrous gluconate (FERGON) 324 MG tablet Take 324 mg by mouth daily with breakfast.   Yes [provider]  methylPREDNISolone (MEDROL DOSEPAK) 4 MG TBPK tablet 6 day dose pack - take as directed 08/15/22  Yes McDonald, Rachelle Hora, DPM  metoprolol succinate (TOPROL-XL) 50 MG 24 hr tablet Take 1 tablet (50 mg total) by mouth at bedtime. 07/03/22 06/28/23 Yes Marykay Lex, MD  nitroGLYCERIN (NITROSTAT) 0.4 MG SL tablet as directed Sublingual once a day for 34 days As needed    [provider]  pantoprazole (PROTONIX) 40 MG tablet Take 1 tablet (40 mg total) by mouth 2 (two) times daily. 03/23/22 04/22/22  Berton Mount I, MD  raloxifene (EVISTA) 60 MG tablet Take 60 mg by mouth daily.    [provider]    Physical Exam: Vitals:   11/25/22 0330 11/25/22 0345 11/25/22 0400 11/25/22 0453  BP: 134/64 129/61  (!) 168/69  Pulse: 75 72  90  Resp: 14 (!) 21  18  Temp:   98.8 F (37.1 C) 98.3 F (36.8 C)  TempSrc:    Oral  SpO2: 94% 94%  92%  Weight:    56.9 kg  Height:    4\' 11"  (1.499 m)   Physical Exam Vitals and nursing note reviewed.  Constitutional:      General: She is awake. She is not in acute distress.     Appearance: Normal appearance.  HENT:     Head: Normocephalic.     Nose: No rhinorrhea.     Mouth/Throat:     Mouth: Mucous membranes are moist.  Eyes:     General: No scleral icterus.    Pupils: Pupils are equal, round, and reactive to light.  Neck:     Vascular: No JVD.  Cardiovascular:     Rate and Rhythm: Normal rate and regular rhythm.     Heart sounds: S1 normal and S2 normal.  Pulmonary:     Effort: No tachypnea or accessory muscle usage.     Breath sounds: Decreased breath sounds and wheezing present. No rhonchi or rales.  Abdominal:     General: Bowel sounds are normal. There is no distension.     Palpations: Abdomen is soft.     Tenderness: There is no abdominal tenderness. There is no guarding.  Musculoskeletal:     Cervical back: Neck supple.     Right lower leg: No edema.     Left lower leg: No edema.  Neurological:     General: No focal deficit present.     Mental Status: She is alert and oriented to person, place, and time.  Psychiatric:        Mood and Affect: Mood normal.        Behavior: Behavior normal. Behavior is cooperative.    Data Reviewed:  Results are pending, will review when available.  01/08/2022 transthoracic echocardiogram without enhancing agent IMPRESSIONS:   1. Left ventricular ejection fraction, by estimation, is 60 to 65%. The left ventricle has normal function. The left ventricle has no regional wall motion abnormalities. Left ventricular diastolic parameters are consistent with Grade II diastolic dysfunction (pseudonormalization).  2. Right ventricular systolic function is mildly reduced. The right ventricular size is mildly enlarged.  3. Right atrial size was moderately dilated.  4. The  mitral valve is normal in structure. Trivial mitral valve regurgitation. No evidence of mitral stenosis.  5. Tricuspid valve regurgitation is moderate to severe.  6. The aortic valve is tricuspid. There is moderate thickening of the aortic  valve. Aortic valve regurgitation is mild to moderate. No aortic stenosis is present.  7. The inferior vena cava is normal in size with greater than 50% respiratory variability, suggesting right atrial pressure of 3 mmHg.  02/01/2022 myocardial perfusion/Lexiscan stress test History and Indications Indication for Stress Test: Evaluation of extent and severity of coronary artery disease History: Known CAD Cardiac Risk Factors: History of Smoking, Hypertension, Lipids, and PVD  Symptoms: Chest Pain, DOE, and Near Syncope Medications: ASA, Plavix, Zetia, Lasix, Imdur, Toprol XL, NTG prn, Diovan   Study Quality  Nuclear Study Quality Overall image quality is good. Image quality affected due to significant extracardiac activity. Study was gated.  Isotope Administration Tc99 Tetrofosmin isotope was administered through IV injection. Rest SPECT images were obtained approximately 45-60 minutes post tracer injection. Rest Dose: 10.9 mCi. Stress dose: 30.2 mCi. Stress SPECT images were obtained approximately 60 minutes post tracer injection.   Stress Findings  Resting ECG ECG rhythm shows normal sinus rhythm.  Stress Findings A pharmacological stress test was performed using IV Lexiscan 0.4mg  over 10 seconds performed without concurrent submaximal exercise.  Maximum HR of 83 bpm.  The patient reported dyspnea and stomach pangs during the stress test. Hypotensive blood pressure response noted during stress.  Stress ECG No ST deviation was noted. The ECG was not diagnostic due to pharmacologic protocol.   Stress Measurements  Baseline Vitals  Rest HR 62 bpm      Rest BP 193/75 mmHg      Peak Stress Vitals  Peak HR 83 bpm      Peak BP 154/67 mmHg          Nuclear Stress Measurements  LV sys vol 10 mL      LV dias vol 42 mL      TID 0.92      Nuc Stress EF 77 %      SSS 2      SRS 0      SDS 2            Nuclear Findings  Chamber Size Left ventricular function is  normal. Nuclear stress EF: 77 %. The left ventricular ejection fraction is hyperdynamic (>65%). Small LV cavity sizes at both end systole and end diastole.  Stress Combined Conclusion Findings are consistent with no prior ischemia. The study is low risk.    Vent. rate 75 BPM PR interval 148 ms QRS duration 72 ms QT/QTcB 405/453 ms P-R-T axes 80 85 -20 Sinus rhythm Borderline right axis deviation Abnormal T, consider ischemia, anterior leads  Assessment and Plan: Principal Problem:   Acute respiratory failure with hypoxia (HCC) In the setting of:   COPD with acute exacerbation (HCC) Observation/telemetry Continue supplemental oxygen. History of GI bleeding with prednisone.` Will use budesonide twice daily. Scheduled and as needed bronchodilators. Follow-up CBC and chemistry in the morning.   Active Problems:   Left ankle swelling Check Doppler to rule out DVT.    Shoulder pain Had a brief episode of neuropathic discomfort. Will initially check shoulder x-ray.    Essential hypertension, benign Continue metoprolol succinate 50 mg p.o. bedtime.    CKD (chronic kidney disease) stage 3, GFR 30-59 ml/min (HCC) Monitor renal function electrolytes.    Dyslipidemia, goal LDL below 70 Continue atorvastatin  80 mg p.o. daily.    PAD (peripheral artery disease) - bilateral CIA & CFA 50-69%; Claudication   CAD S/P percutaneous coronary angioplasty  -- Promus DES 2.5 mm x 20 mm postdilated to 3 mm3 Continue ASA, statin and beta-blocker.    Chronic diastolic CHF (congestive heart failure) (HCC) No signs of volume overload.    Lung nodules Scheduled for biopsy on Tuesday.    Tobacco use Tobacco cessation advised. Nicotine replacement therapy ordered.     Advance Care Planning:   Code Status: Full Code   Consults:   Family Communication: Several family members were present in her room.  Severity of Illness: The appropriate patient status for this patient is OBSERVATION.  Observation status is judged to be reasonable and necessary in order to provide the required intensity of service to ensure the patient's safety. The patient's presenting symptoms, physical exam findings, and initial radiographic and laboratory data in the context of their medical condition is felt to place them at decreased risk for further clinical deterioration. Furthermore, it is anticipated that the patient will be medically stable for discharge from the hospital within 2 midnights of admission.   Author: Bobette Mo, MD 11/25/2022 7:19 AM  For on call review [email or web address].   This document was prepared using Dragon voice recognition software and may contain some unintended transcription errors.

## 2022-11-25 NOTE — ED Notes (Signed)
Patient trialed off of nasal cannula, O2 down to 82%, placed back on Schleicher @ 2LPM, MD Bero notified and at bedside for evaluation.

## 2022-11-25 NOTE — ED Notes (Signed)
Patient to CT by stretcher at this time.

## 2022-11-25 NOTE — Progress Notes (Signed)
Plan of Care Note for accepted transfer   Patient: Kayla Mcclure MRN: [id number]   DOA: 11/24/2022  Facility requesting transfer: [facility]   Requesting Provider: Dr. Pilar Plate   Reason for transfer: Acute hypoxic respiratory failure   Facility course: 77 year old female with HTN, CKD 3B, CAD, PAD, and gout who presents with worsening right foot and ankle pain and swelling.  She is afebrile with oxygen saturation dipping to the low to mid 80s on room air.  Labs notable for creatinine 1.30, normal WBC, normal troponin, and D-dimer 0.75.  CTA chest is negative for PE but notable for previously identified pulmonary nodules.   Patient reports a history of asthma but exam is not consistent with asthma exacerbation per report of ED physician.  She was treated with Norco, DuoNeb, and supplemental oxygen in the ED.  Plan of care: The patient is accepted for admission to Telemetry unit, at [facility].   Author: Briscoe Deutscher, MD 11/25/2022  Check [email or web address] for on-call coverage.  Nursing staff, Please call [facility] Admits & Consults System-Wide number on Amion as soon as patient's arrival, so appropriate admitting provider can evaluate the pt.

## 2022-11-26 ENCOUNTER — Other Ambulatory Visit: Payer: Self-pay

## 2022-11-26 DIAGNOSIS — M79604 Pain in right leg: Secondary | ICD-10-CM

## 2022-11-26 DIAGNOSIS — J9601 Acute respiratory failure with hypoxia: Secondary | ICD-10-CM | POA: Diagnosis not present

## 2022-11-26 LAB — COMPREHENSIVE METABOLIC PANEL
ALT: 10 U/L (ref 0–44)
AST: 16 U/L (ref 15–41)
Albumin: 3.3 g/dL — ABNORMAL LOW (ref 3.5–5.0)
Alkaline Phosphatase: 43 U/L (ref 38–126)
Anion gap: 5 (ref 5–15)
BUN: 20 mg/dL (ref 8–23)
CO2: 25 mmol/L (ref 22–32)
Calcium: 8.6 mg/dL — ABNORMAL LOW (ref 8.9–10.3)
Chloride: 105 mmol/L (ref 98–111)
Creatinine, Ser: 1.65 mg/dL — ABNORMAL HIGH (ref 0.44–1.00)
GFR, Estimated: 32 mL/min — ABNORMAL LOW (ref >60)
Glucose, Bld: 92 mg/dL (ref 70–99)
Potassium: 4.7 mmol/L (ref 3.5–5.1)
Sodium: 135 mmol/L (ref 135–145)
Total Bilirubin: 0.9 mg/dL (ref 0.3–1.2)
Total Protein: 7.1 g/dL (ref 6.5–8.1)

## 2022-11-26 LAB — CBC
HCT: 40 % (ref 36.0–46.0)
Hemoglobin: 12.2 g/dL (ref 12.0–15.0)
MCH: 27.9 pg (ref 26.0–34.0)
MCHC: 30.5 g/dL (ref 30.0–36.0)
MCV: 91.3 fL (ref 80.0–100.0)
Platelets: 177 10*3/uL (ref 150–400)
RBC: 4.38 MIL/uL (ref 3.87–5.11)
RDW: 17.2 % — ABNORMAL HIGH (ref 11.5–15.5)
WBC: 6.8 10*3/uL (ref 4.0–10.5)
nRBC: 0 % (ref 0.0–0.2)

## 2022-11-26 MED ORDER — ENSURE ENLIVE PO LIQD
237.0000 mL | Freq: Two times a day (BID) | ORAL | Status: DC
  Administered 2022-11-26: 237 mL via ORAL

## 2022-11-26 NOTE — Anesthesia Preprocedure Evaluation (Signed)
Anesthesia Evaluation  Patient identified by MRN, date of birth, ID band Patient awake    Reviewed: Allergy & Precautions, NPO status , Patient's Chart, lab work & pertinent test results, reviewed documented beta blocker date and time   Airway Mallampati: II  TM Distance: >3 FB Neck ROM: Full    Dental  (+) Dental Advisory Given, Missing, Edentulous Lower,    Pulmonary asthma , COPD,  COPD inhaler, Current Smoker and Patient abstained from smoking. RIGHT LUNG APEX MASS   Pulmonary exam normal breath sounds clear to auscultation       Cardiovascular hypertension, Pt. on home beta blockers and Pt. on medications + CAD, + Past MI, + Cardiac Stents (DES to RCA), + Peripheral Vascular Disease (s/p B/L CEA) and +CHF  Normal cardiovascular exam Rhythm:Regular Rate:Normal     Neuro/Psych CVA    GI/Hepatic Neg liver ROS,GERD  Medicated,,  Endo/Other  negative endocrine ROS    Renal/GU Renal InsufficiencyRenal disease     Musculoskeletal negative musculoskeletal ROS (+)    Abdominal   Peds  Hematology negative hematology ROS (+)   Anesthesia Other Findings Day of surgery medications reviewed with the patient.  Reproductive/Obstetrics                              Anesthesia Physical Anesthesia Plan  ASA: 4  Anesthesia Plan: General   Post-op Pain Management: Tylenol PO (pre-op)*   Induction: Intravenous  PONV Risk Score and Plan: 2 and Dexamethasone and Ondansetron  Airway Management Planned: Oral ETT  Additional Equipment:   Intra-op Plan:   Post-operative Plan: Extubation in OR  Informed Consent: I have reviewed the patients History and Physical, chart, labs and discussed the procedure including the risks, benefits and alternatives for the proposed anesthesia with the patient or authorized representative who has indicated his/her understanding and acceptance.     Dental advisory  given  Plan Discussed with: CRNA  Anesthesia Plan Comments: (PAT note by Antionette Poles, PA-C: Follows with cardiology for history of CAD s/p NSTEMI treated with DES to RCA, HFpEF, HLD, HTN, palpitations.  Last cath in 2020 showed stable widely patent RCA stent with RPAV stenosis treated medically.  Myoview 01/2022 was low risk.  Last seen by Dr. Herbie Baltimore 08/20/2022, stable from cardiac standpoint.  Follows with vascular surgery for history of PAD s/p iliac artery stenting 2015, left CEA 07/28/2019, right CEA 08/27/2019, bilateral renal artery stenting 01/15/2022.  Carotid duplex May 2024 showed 1 to 39% bilateral ICA stenosis.  Renal artery ultrasound 11/2022 showed patent stents.  Former smoker, quit 2015, is followed by pulmonology for monitoring of multiple pulmonary nodules.  Prior bronchoscopy on 06/06/2021.  History of CKD 3.  Patient is currently admitted at [facility].  She presented to the ED on 11/24/2022 with complaint of foot pain.  Lower extremity ultrasound was negative for DVT.  Imaging the right foot and ankle showed no fracture or dislocation.  There was diffuse soft tissue swelling.  She was incidentally found to be hypoxic and was treated for COPD exacerbation with supplemental oxygen, budesonide, bronchodilators.  No prednisone due to history of previous GI bleed with prednisone.  She was advised to follow-up for scheduled bronchoscopy on 11/27/2022.  Patient will need day of surgery evaluation.  CMP and CBC 11/26/2022 reviewed, creatinine elevated 1.65, otherwise unremarkable.  EKG 11/25/2022: NSR.  T wave abnormality, consider anterior ischemia.  No significant change since last tracing.  CTA chest  11/25/2022: IMPRESSION: No evidence of pulmonary emboli.   Bilateral upper lobe nodular densities similar to that seen on prior exams. Again the lesion in the right upper lobe is suspicious for underlying carcinoma.   Aortic Atherosclerosis (ICD10-I70.0).  Carotid duplex  08/01/2022: Summary:  Right Carotid: Velocities in the right ICA are consistent with a 1-39% stenosis. The ECA appears >50% stenosed.  Left Carotid: Velocities in the left ICA are consistent with a 1-39% stenosis.  Vertebrals: Bilateral vertebral arteries demonstrate antegrade flow.  Subclavians: Normal flow hemodynamics were seen in bilateral subclavian arteries.   Nuclear stress 02/01/22:   Findings are consistent with no prior ischemia. The study is low risk.   A pharmacological stress test was performed using IV Lexiscan 0.4mg  over 10 seconds performed without concurrent submaximal exercise. Hypotensive blood pressure response noted during stress.   No ST deviation was noted.   Left ventricular function is normal. Nuclear stress EF: 77 %. The left ventricular ejection fraction is hyperdynamic (>65%). Small LV cavity sizes at both end systole and end diastole.   Prior study not available for comparison.   Prominent RV uptake noted on study. Blood pressure dropped significantly with infusion of lexiscan (193/75 prior to infusion -> 134/62 with infusion -> 154/67 at end of recovery). Small chamber size with hyperdynamic LVEF, no evidence of ischemia.  TTE 01/08/2022: 1. Left ventricular ejection fraction, by estimation, is 60 to 65%. The  left ventricle has normal function. The left ventricle has no regional  wall motion abnormalities. Left ventricular diastolic parameters are  consistent with Grade II diastolic  dysfunction (pseudonormalization).   2. Right ventricular systolic function is mildly reduced. The right  ventricular size is mildly enlarged.   3. Right atrial size was moderately dilated.   4. The mitral valve is normal in structure. Trivial mitral valve  regurgitation. No evidence of mitral stenosis.   5. Tricuspid valve regurgitation is moderate to severe.   6. The aortic valve is tricuspid. There is moderate thickening of the  aortic valve. Aortic valve regurgitation is  mild to moderate. No aortic  stenosis is present.   7. The inferior vena cava is normal in size with greater than 50%  respiratory variability, suggesting right atrial pressure of 3 mmHg.   Cath 02/11/2019:   The left ventricular systolic function is normal. The left ventricular ejection fraction is greater than 65% by visual estimate.  LV end diastolic pressure is normal.  ---------------  Prox LAD lesion is 30% stenosed. Mid LAD to Dist LAD lesion is 40% stenosed with 40% stenosed side branch in 2nd Diag.  Prox RCA lesion is 50% stenosed.  Prox RCA to Mid RCA DES (2009) is 15% stenosed.  RPAV lesion is 75% stenosed - too small for PTCA or PCI  Minimal disease in LAD & LCx   SUMMARY  Patent RCA stent with minimal ISR  Moderate pRCA lesion & 75% ostial RPAV (very small caliber vessel) - could be potential culprits - recommend medical management.  Normal LVEF & EDP.   RECOMMENDATIONS  Discharge home after bedrest -- she should have f/u scheduled to discuss medical management  Consider Pulmonary Cause for Dyspnea on Exertion -- Defer Pulmonary evaluation to PCP   )         Anesthesia Quick Evaluation

## 2022-11-26 NOTE — Plan of Care (Signed)
  Problem: Education: Goal: Knowledge of General Education information will improve Description: Including pain rating scale, medication(s)/side effects and non-pharmacologic comfort measures 11/26/2022 1523 by Val Eagle, RN Outcome: Not Progressing 11/26/2022 1503 by Val Eagle, RN Outcome: Not Progressing

## 2022-11-26 NOTE — Progress Notes (Addendum)
I spoke with Kayla Mcclure while she was waiting to be discharged today. [name] denies chest pain or shortness of breath. Patient stated that she went to the ED on Sat, 11/24/22 because of bilateral foot pain. [name] stated she did not know why she was admitted, I explained that her oxygen sats where low and feet were swollen, they admitted to evaluate the reason why these were present. DVT and PE were ruled out. [name] said that she walked in the room today without oxygen, she will be discharged without supplemental oxygen.  [name]'s PCP is Horton Marshall, [place], cardiologist is Dr. Bryan Lemma.  Antionette Poles, PA-C for anesthesia reviewed the chart.

## 2022-11-26 NOTE — Discharge Summary (Signed)
Physician Discharge Summary   Patient: Kayla Mcclure MRN: [id number] DOB: [birth year]  Admit date:     11/24/2022  Discharge date: 11/26/22  Discharge Physician: Rickey Barbara   PCP: Wilfrid Lund, PA   Recommendations at discharge:    Follow up with PCP in 1-2 weeks Follow up with Bronchoscopy as scheduled for 8/27  Discharge Diagnoses: Principal Problem:   Acute respiratory failure with hypoxia (HCC) Active Problems:   Essential hypertension, benign   CKD (chronic kidney disease) stage 3, GFR 30-59 ml/min (HCC)   Dyslipidemia, goal LDL below 70   PAD (peripheral artery disease) - bilateral CIA & CFA 50-69%; Claudication   CAD S/P percutaneous coronary angioplasty -- Promus DES 2.5 mm x 20 mm postdilated to 3 mm   Chronic diastolic CHF (congestive heart failure) (HCC)   Lung nodules   Left ankle swelling   Tobacco use   COPD with acute exacerbation (HCC)  Resolved Problems:   * No resolved hospital problems. *  Hospital Course: 77 y.o. female with medical history significant of GI bleeding, CAD, NSTEMI, chronic diastolic heart failure, essential hypertension, peripheral arterial disease, history of TIA, coronary artery disease, chronic back pain, GERD, diverticulosis, nephrolithiasis, iron deficiency anemia, bilateral lung nodules, tobacco abuse who presented to the emergency department with complaints of bilateral feet swelling for the past 2 weeks.  The patient stated that she tried to step on a bug with her right foot hitting the floor very hard.  The foot started getting swollen and painful next day.  She has also been having dyspnea and wheezing. He denied fever, chills, rhinorrhea, sore throat, wheezing or hemoptysis.  No chest pain, palpitations, diaphoresis, PND, orthopnea or pitting edema of the lower extremities.  No abdominal pain, nausea, emesis, diarrhea, constipation, melena or hematochezia.  No flank pain, dysuria, frequency or hematuria.  No polyuria,  polydipsia, polyphagia or blurred vision.   Assessment and Plan: Principal Problem:   Acute respiratory failure with hypoxia (HCC) In the setting of:   COPD with acute exacerbation (HCC) -weaned to room air -No wheezing on exam -ambulated on room air without issues or desaturation   Active Problems:   Left ankle swelling LE dopplers neg for DVT -L foot xray notable only for soft tissue swelling, no fractures -Pt ambulated without difficulty     Shoulder pain Had a brief episode of neuropathic discomfort. Shoulder xray reviewed, unremarkable     Essential hypertension, benign Continued metoprolol succinate 50 mg p.o. bedtime.     CKD (chronic kidney disease) stage 3, GFR 30-59 ml/min (HCC) Remained stable     Dyslipidemia, goal LDL below 70 Continued atorvastatin 80 mg p.o. daily.     PAD (peripheral artery disease) - bilateral CIA & CFA 50-69%; Claudication   CAD S/P percutaneous coronary angioplasty  -- Promus DES 2.5 mm x 20 mm postdilated to 3 mm3 Continue ASA, statin and beta-blocker.     Chronic diastolic CHF (congestive heart failure) (HCC) No signs of volume overload.     Lung nodules Scheduled for bronch 8/27 as scheduled     Tobacco use Tobacco cessation advised. Nicotine replacement therapy ordered.     Consultants:  Procedures performed:   Disposition: Home Diet recommendation:  Regular diet DISCHARGE MEDICATION: Allergies as of 11/26/2022       Reactions   Diclofenac Sodium    Other Reaction(s): History of GI bleeds   Nsaids    Other Reaction(s): History of GI bleeds   Prednisone  Other Reaction(s): History of GI bleeds        Medication List     TAKE these medications    acetaminophen 325 MG tablet Commonly known as: TYLENOL Take 650 mg by mouth every 6 (six) hours as needed for mild pain or moderate pain.   albuterol 108 (90 Base) MCG/ACT inhaler Commonly known as: VENTOLIN HFA Inhale 2 puffs into the lungs every 6 (six)  hours as needed for wheezing or shortness of breath.   alendronate 70 MG tablet Commonly known as: FOSAMAX TAKE 1 TABLET BY MOUTH WEEKLY  WITH 8 OZ OF PLAIN WATER 30  MINUTES BEFORE FIRST FOOD, DRINK OR MEDS. STAY UPRIGHT FOR 30  MINS What changed: See the new instructions.   allopurinol 100 MG tablet Commonly known as: ZYLOPRIM Take 100 mg by mouth daily.   aspirin EC 81 MG tablet Take 1 tablet (81 mg total) by mouth daily.   atorvastatin 80 MG tablet Commonly known as: LIPITOR Take 1 tablet (80 mg total) by mouth daily.   colchicine 0.6 MG tablet Take 1.2mg  (2 tablets) then 0.6mg  (1 tablet) 1 hour after   ezetimibe 10 MG tablet Commonly known as: ZETIA Take 1 tablet (10 mg total) by mouth daily.   ferrous gluconate 324 MG tablet Commonly known as: FERGON Take 324 mg by mouth daily with breakfast.   gabapentin 300 MG capsule Commonly known as: NEURONTIN Take 300 mg by mouth 3 (three) times daily.   metoprolol succinate 50 MG 24 hr tablet Commonly known as: TOPROL-XL Take 1 tablet (50 mg total) by mouth at bedtime.   pantoprazole 40 MG tablet Commonly known as: PROTONIX Take 1 tablet (40 mg total) by mouth 2 (two) times daily.   raloxifene 60 MG tablet Commonly known as: EVISTA Take 60 mg by mouth daily.   valsartan 40 MG tablet Commonly known as: DIOVAN Take 40 mg by mouth daily.        Follow-up Information     Bronchoscopy Follow up.   Why: Procedure: Bronchoscopy                  Surgeon:  DR. Janalee Dane at  9:30 AM Location: [facility], [address], [place],  [place] [address]        Wilfrid Lund, PA Follow up in 2 week(s).   Specialty: Family Medicine Why: Hospital follow up Contact information: [address] [place] [address] [phone]         Kandice Robinsons, NP Follow up.   Why: Wednesday, 12/05/2022 @ 1:30 PM  LOCATION: [facility], [address], [address],  [place], [place] [address]               Discharge Exam: Filed Weights   11/24/22 2214 11/25/22 0453  Weight: 55.7 kg 56.9 kg   General exam: Conversant, in no acute distress Respiratory system: normal chest rise, clear, no audible wheezing Cardiovascular system: regular rhythm, s1-s2 Gastrointestinal system: Nondistended, nontender, pos BS Central nervous system: No seizures, no tremors Extremities: No cyanosis, no joint deformities Skin: No rashes, no pallor Psychiatry: Affect normal // no auditory hallucinations   Condition at discharge: fair  The results of significant diagnostics from this hospitalization (including imaging, microbiology, ancillary and laboratory) are listed below for reference.   Imaging Studies: VAS [place] LOWER EXTREMITY VENOUS (DVT)  Result Date: 11/25/2022  Lower Venous DVT Study Patient Name:  LAJAYLA ZACKERY  Date of  Exam:   11/25/2022 Medical Rec #: [id number]              Accession #:    [id number] Date of Birth: 08-28-1945               Patient Gender: F Patient Age:   65 years Exam Location:  [facility] Procedure:      VAS [place] LOWER EXTREMITY VENOUS (DVT) Referring Phys: DAVID ORTIZ --------------------------------------------------------------------------------  Indications: Swelling.  Risk Factors: None identified. Comparison Study: No prior studies. Performing Technologist: Chanda Busing RVT  Examination Guidelines: A complete evaluation includes B-mode imaging, spectral Doppler, color Doppler, and power Doppler as needed of all accessible portions of each vessel. Bilateral testing is considered an integral part of a complete examination. Limited examinations for reoccurring indications may be performed as noted. The reflux portion of the exam is performed with the patient in reverse Trendelenburg.  +---------+---------------+---------+-----------+----------+--------------+ RIGHT    CompressibilityPhasicitySpontaneityPropertiesThrombus  Aging +---------+---------------+---------+-----------+----------+--------------+ CFV      Full           Yes      Yes                                 +---------+---------------+---------+-----------+----------+--------------+ SFJ      Full                                                        +---------+---------------+---------+-----------+----------+--------------+ FV Prox  Full                                                        +---------+---------------+---------+-----------+----------+--------------+ FV Mid   Full                                                        +---------+---------------+---------+-----------+----------+--------------+ FV DistalFull                                                        +---------+---------------+---------+-----------+----------+--------------+ PFV      Full                                                        +---------+---------------+---------+-----------+----------+--------------+ POP      Full           Yes      Yes                                 +---------+---------------+---------+-----------+----------+--------------+ PTV      Full                                                        +---------+---------------+---------+-----------+----------+--------------+  PERO     Full                                                        +---------+---------------+---------+-----------+----------+--------------+   +---------+---------------+---------+-----------+----------+--------------+ LEFT     CompressibilityPhasicitySpontaneityPropertiesThrombus Aging +---------+---------------+---------+-----------+----------+--------------+ CFV      Full           Yes      Yes                                 +---------+---------------+---------+-----------+----------+--------------+ SFJ      Full                                                         +---------+---------------+---------+-----------+----------+--------------+ FV Prox  Full                                                        +---------+---------------+---------+-----------+----------+--------------+ FV Mid   Full                                                        +---------+---------------+---------+-----------+----------+--------------+ FV DistalFull                                                        +---------+---------------+---------+-----------+----------+--------------+ PFV      Full                                                        +---------+---------------+---------+-----------+----------+--------------+ POP      Full           Yes      Yes                                 +---------+---------------+---------+-----------+----------+--------------+ PTV      Full                                                        +---------+---------------+---------+-----------+----------+--------------+ PERO     Full                                                        +---------+---------------+---------+-----------+----------+--------------+  Summary: RIGHT: - There is no evidence of deep vein thrombosis in the lower extremity.  - No cystic structure found in the popliteal fossa.  LEFT: - There is no evidence of deep vein thrombosis in the lower extremity.  - No cystic structure found in the popliteal fossa.  *See table(s) above for measurements and observations. Electronically signed by Coral Else MD on 11/25/2022 at 8:53:27 PM.    Final    DG Shoulder Left  Result Date: 11/25/2022 CLINICAL DATA:  Neuropathic pain.  Left shoulder pain. EXAM: LEFT SHOULDER - 2+ VIEW COMPARISON:  None Available. FINDINGS: There is no evidence of fracture or dislocation. Minor acromioclavicular degenerative change. Glenohumeral joint space is preserved. No erosions. Soft tissues are unremarkable. IMPRESSION: Minor acromioclavicular degenerative  change. Electronically Signed   By: Narda Rutherford M.D.   On: 11/25/2022 17:12   CT Angio Chest Pulmonary Embolism (PE) W or WO Contrast  Result Date: 11/25/2022 CLINICAL DATA:  Hypoxia EXAM: CT ANGIOGRAPHY CHEST WITH CONTRAST TECHNIQUE: Multidetector CT imaging of the chest was performed using the standard protocol during bolus administration of intravenous contrast. Multiplanar CT image reconstructions and MIPs were obtained to evaluate the vascular anatomy. RADIATION DOSE REDUCTION: This exam was performed according to the departmental dose-optimization program which includes automated exposure control, adjustment of the mA and/or kV according to patient size and/or use of iterative reconstruction technique. CONTRAST:  60mL OMNIPAQUE IOHEXOL 350 MG/ML SOLN COMPARISON:  11/05/22, 10/09/2022 FINDINGS: Cardiovascular: Atherosclerotic calcifications of the thoracic aorta are noted. Heart is at the upper limits of normal in size. The pulmonary artery shows a normal branching pattern bilaterally. No filling defect to suggest pulmonary embolism is noted. Mediastinum/Nodes: Thoracic inlet is within normal limits. No hilar or mediastinal adenopathy is noted. The esophagus as visualized is normal limits. Lungs/Pleura: Left lung is well aerated. Soft tissue nodule is noted in the apex measuring up to 9 mm. This is best visualized on image number 43 of series 6. This is stable from the most recent PET-CT. The right lung demonstrates an upper lobe somewhat bilobed nodular lesion which shows increased activity on recent PET-CT again suspicious for bronchogenic carcinoma. No new focal nodules noted on the right. No confluent infiltrate is seen. Upper Abdomen: No acute abnormality. Musculoskeletal: Degenerative changes of the thoracic spine are noted. No rib abnormality is seen. Review of the MIP images confirms the above findings. IMPRESSION: No evidence of pulmonary emboli. Bilateral upper lobe nodular densities similar to  that seen on prior exams. Again the lesion in the right upper lobe is suspicious for underlying carcinoma. Aortic Atherosclerosis (ICD10-I70.0). Electronically Signed   By: Alcide Clever M.D.   On: 11/25/2022 02:28   DG Ankle Complete Right  Result Date: 11/24/2022 CLINICAL DATA:  Trauma to the right lower extremity. EXAM: RIGHT ANKLE - COMPLETE 3+ VIEW; RIGHT TIBIA AND FIBULA - 2 VIEW COMPARISON:  None Available. FINDINGS: There is no acute fracture or dislocation. The bones are osteopenic. No significant arthritic changes. There is diffuse subcutaneous edema. No radiopaque foreign object or soft tissue gas. IMPRESSION: 1. No acute fracture or dislocation. 2. Diffuse subcutaneous edema. Electronically Signed   By: Elgie Collard M.D.   On: 11/24/2022 23:40   DG Tibia/Fibula Right  Result Date: 11/24/2022 CLINICAL DATA:  Trauma to the right lower extremity. EXAM: RIGHT ANKLE - COMPLETE 3+ VIEW; RIGHT TIBIA AND FIBULA - 2 VIEW COMPARISON:  None Available. FINDINGS: There is no acute fracture or dislocation. The bones are osteopenic. No  significant arthritic changes. There is diffuse subcutaneous edema. No radiopaque foreign object or soft tissue gas. IMPRESSION: 1. No acute fracture or dislocation. 2. Diffuse subcutaneous edema. Electronically Signed   By: Elgie Collard M.D.   On: 11/24/2022 23:40   DG Foot Complete Right  Result Date: 11/24/2022 CLINICAL DATA:  Right foot pain. EXAM: RIGHT FOOT COMPLETE - 3+ VIEW COMPARISON:  Right foot radiograph dated 10/23/2021 FINDINGS: There is no acute fracture or dislocation. The bones are osteopenic. No significant arthritic changes. There is soft tissue swelling of the dorsal foot. No opaque foreign object or soft tissue gas. IMPRESSION: 1. No acute fracture or dislocation. 2. Soft tissue swelling. Electronically Signed   By: Elgie Collard M.D.   On: 11/24/2022 23:06   VAS US RENAL ARTERY DUPLEX  Result Date: 11/14/2022 ABDOMINAL VISCERAL Patient  Name:  CAMMI KLUVER  Date of Exam:   11/14/2022 Medical Rec #: [id number]              Accession #:    [id number] Date of Birth: 09-Jan-1946               Patient Gender: F Patient Age:   11 years Exam Location:  [facility] Procedure:      VAS US RENAL ARTERY DUPLEX Referring Phys: [id number] Apolinar Junes CHRISTOPHER CAIN -------------------------------------------------------------------------------- High Risk Factors: Hypertension, prior MI, coronary artery disease. Vascular Interventions: 01/15/22 B/L renal artery stenting by Dr Randie Heinz. Comparison Study: 02/14/22 prior Performing Technologist: Argentina Ponder RVS  Examination Guidelines: A complete evaluation includes B-mode imaging, spectral Doppler, color Doppler, and power Doppler as needed of all accessible portions of each vessel. Bilateral testing is considered an integral part of a complete examination. Limited examinations for reoccurring indications may be performed as noted.  Duplex Findings: +----------------------+--------+--------+------+--------+ Mesenteric            PSV cm/sEDV cm/sPlaqueComments +----------------------+--------+--------+------+--------+ Aorta Prox               77                          +----------------------+--------+--------+------+--------+ Celiac Artery Proximal   97      19                  +----------------------+--------+--------+------+--------+ SMA Proximal            131                          +----------------------+--------+--------+------+--------+    +------------------+--------+--------+-------+ Right Renal ArteryPSV cm/sEDV cm/sComment +------------------+--------+--------+-------+ Origin               78      18           +------------------+--------+--------+-------+ Proximal             77      18           +------------------+--------+--------+-------+ Mid                  70      17           +------------------+--------+--------+-------+ Distal               64       13           +------------------+--------+--------+-------+ +-----------------+--------+--------+-------+ Left Renal ArteryPSV cm/sEDV cm/sComment +-----------------+--------+--------+-------+ Origin  81      18           +-----------------+--------+--------+-------+ Proximal            59      12           +-----------------+--------+--------+-------+ Mid                 62      13           +-----------------+--------+--------+-------+ Distal              53      7            +-----------------+--------+--------+-------+ +------------+--------+--------+----+-----------+--------+--------+----+ Right KidneyPSV cm/sEDV cm/sRI  Left KidneyPSV cm/sEDV cm/sRI   +------------+--------+--------+----+-----------+--------+--------+----+ Upper Pole  38      8       0.79Upper Pole 17      5       0.71 +------------+--------+--------+----+-----------+--------+--------+----+ Mid         35      8       0.        24      7       0.71 +------------+--------+--------+----+-----------+--------+--------+----+ Lower Pole  38      8       0.79Lower Pole 21      6       0.71 +------------+--------+--------+----+-----------+--------+--------+----+ Hilar       61      15      0.75Hilar      54      13      0.76 +------------+--------+--------+----+-----------+--------+--------+----+ +------------------+----+------------------+---+ Right Kidney          Left Kidney           +------------------+----+------------------+---+ RAR                   RAR                   +------------------+----+------------------+---+ RAR (manual)      1.0 RAR (manual)      1.1 +------------------+----+------------------+---+ Cortex                Cortex                +------------------+----+------------------+---+ Cortex thickness      Corex thickness       +------------------+----+------------------+---+ Kidney length (cm)8.98Kidney  length (cm)    +------------------+----+------------------+---+  Summary: Renal:  Right: Normal size right kidney. Abnormal right Resistive Index.        Patent stent without obvious evidence of stenosis. Left:  Normal size of left kidney. Abnormal left Resisitve Index.        Patent stent without obvious evidence of stenosis. Mesenteric: Normal Celiac artery and Superior Mesenteric artery findings.  *See table(s) above for measurements and observations.  Diagnosing physician: Lemar Livings MD  Electronically signed by Lemar Livings MD on 11/14/2022 at 10:34:44 AM.    Final    NM PET Image Restage (PS) Skull Base to Thigh (F-18 FDG)  Result Date: 11/09/2022 CLINICAL DATA:  Subsequent treatment strategy for pulmonary nodules. EXAM: NUCLEAR MEDICINE PET SKULL BASE TO THIGH TECHNIQUE: 5.95 mCi F-18 FDG was injected intravenously. Full-ring PET imaging was performed from the skull base to thigh after the radiotracer. CT data was obtained and used for attenuation correction and anatomic localization. Fasting blood glucose: 101 mg/dl COMPARISON:  CT chest dated 10/09/2022.  PET-CT dated 05/16/2021. FINDINGS: Mediastinal blood pool activity: SUV  max 2.3 Liver activity: SUV max NA NECK: No hypermetabolic cervical lymphadenopathy. Incidental CT findings: None. CHEST: 13 mm medial left upper lobe nodule with adjacent marker/clip (series 7/image 16), grossly unchanged dating back to March 2023, with mild hypermetabolism, max SUV 2.3. Associated mild peribronchovascular irregularity in the medial left upper lobe. Infectious/inflammatory etiology is favored. 2.9 x 1.6 cm irregular opacity in the medial right lung apex (series 7/image 13), with associated dominant 12 mm central nodular opacity (series 7/image 12), max SUV 7.2. This previously measured 2.8 x 1.3 cm on the most recent CT and is essentially new from March 2023. Primary bronchogenic carcinoma is favored. Associated chronic peribronchovascular nodularity/scarring in  the medial right upper lobe (series 7/image 20), likely post infectious inflammatory. Limited short axis low right paratracheal node (series 4/image 58), max SUV 3.5. Mild right perihilar hypermetabolism, max SUV 3.4. Incidental CT findings: Atherosclerotic calcifications of the aortic arch. Severe three-vessel coronary atherosclerosis. ABDOMEN/PELVIS: No abnormal hypermetabolism in the liver, spleen, pancreas, or adrenal glands. No hypermetabolic abdominopelvic lymphadenopathy. Incidental CT findings: Atherosclerotic calcifications of the abdominal aorta and branch vessels. Calcified uterine fibroid. SKELETON: No focal hypermetabolic activity to suggest skeletal metastasis. Incidental CT findings: Degenerative changes of the visualized thoracolumbar spine. IMPRESSION: 2.9 cm irregular nodule in the medial right lung apex, suspicious for primary bronchogenic carcinoma. Possible right paratracheal and right perihilar nodal metastases, equivocal. Additional bilateral upper lobe opacities favor sequela of chronic infection/inflammation, possibly chronic atypical mycobacterial infection. No evidence of metastatic disease in the abdomen/pelvis. Electronically Signed   By: Charline Bills M.D.   On: 11/09/2022 01:01    Microbiology: Results for orders placed or performed during the hospital encounter of 06/06/21  Fungus Culture With Stain     Status: None   Collection Time: 06/06/21  3:31 PM   Specimen: PATH Cytology Navigation; Tissue  Result Value Ref Range Status   Fungus Stain Final report  Final   Fungus (Mycology) Culture Final report  Final    Comment: (NOTE) Performed At: [facility] [address] [place], [place] [id number] Jolene Schimke MD QI:[id number]    Fungal Source LUL NODULE, BX FORCEPS  Final    Comment: Performed at [facility], [address], [place], [place] [address]  Aerobic/Anaerobic Culture w Gram Stain (surgical/deep wound)     Status: None   Collection Time:  06/06/21  3:31 PM   Specimen: PATH Cytology Navigation; Tissue  Result Value Ref Range Status   Specimen Description TISSUE  Final   Special Requests LUL NODULE, BX FORCEPS  Final   Gram Stain NO WBC SEEN NO ORGANISMS SEEN   Final   Culture   Final    No growth aerobically or anaerobically. Performed at [facility], [address] [place], [place] [address]    Report Status 06/11/2021 FINAL  Final  Acid Fast Culture with reflexed sensitivities     Status: None   Collection Time: 06/06/21  3:31 PM   Specimen: PATH Cytology Navigation; Tissue  Result Value Ref Range Status   Acid Fast Culture Negative  Final    Comment: (NOTE) No acid fast bacilli isolated after 6 weeks. Performed At: [facility] [address] [place], [place] [id number] Jolene Schimke MD TK:[id number]    Source of Sample LUL NODULE, BX FORCEPS  Final    Comment: Performed at [facility], [address] [place], [place] [address]  Acid Fast Smear (AFB)     Status: None  Collection Time: 06/06/21  3:31 PM   Specimen: PATH Cytology Navigation; Tissue  Result Value Ref Range Status   AFB Specimen Processing Concentration  Final   Acid Fast Smear Negative  Final    Comment: (NOTE) Performed At: [facility] [address] [place], [place] [id number] Jolene Schimke MD WU:[id number]    Source (AFB) LUL NODULE, BX FORCEPS  Final    Comment: Performed at [facility], [address], [place], [place] [address]  Fungus Culture Result     Status: None   Collection Time: 06/06/21  3:31 PM  Result Value Ref Range Status   Result 1 Comment  Final    Comment: (NOTE) KOH/Calcofluor preparation:  no fungus observed. Performed At: [facility] [address] [place], [place] [id number] Jolene Schimke MD NG:[id number]   Fungal organism reflex     Status: None   Collection Time: 06/06/21  3:31 PM  Result Value Ref Range Status   Fungal result 1 Comment  Final     Comment: (NOTE) No yeast or mold isolated after 4 weeks. Performed At: [facility] [address] [place], [place] [id number] Jolene Schimke MD GU:[id number]     Labs: CBC: Recent Labs  Lab 11/25/22 0059 11/25/22 1943 11/26/22 0409  WBC 9.2 5.7 6.8  NEUTROABS  --  3.2  --   HGB 12.1 11.4* 12.2  HCT 38.0 37.3 40.0  MCV 86.2 90.5 91.3  PLT 195 174 177   Basic Metabolic Panel: Recent Labs  Lab 11/25/22 0059 11/26/22 0409  NA 139 135  K 4.3 4.7  CL 106 105  CO2 25 25  GLUCOSE 98 92  BUN 23 20  CREATININE 1.30* 1.65*  CALCIUM 9.3 8.6*   Liver Function Tests: Recent Labs  Lab 11/26/22 0409  AST 16  ALT 10  ALKPHOS 43  BILITOT 0.9  PROT 7.1  ALBUMIN 3.3*   CBG: No results for input(s): "GLUCAP" in the last 168 hours.  Discharge time spent: less than 30 minutes.  Signed: Rickey Barbara, MD [facility] 11/26/2022

## 2022-11-26 NOTE — Progress Notes (Signed)
Anesthesia Chart Review: Same day workup  Follows with cardiology for history of CAD s/p NSTEMI treated with DES to RCA, HFpEF, HLD, HTN, palpitations.  Last cath in 2020 showed stable widely patent RCA stent with RPAV stenosis treated medically.  Myoview 01/2022 was low risk.  Last seen by Dr. Herbie Baltimore 08/20/2022, stable from cardiac standpoint.  Follows with vascular surgery for history of PAD s/p iliac artery stenting 2015, left CEA 07/28/2019, right CEA 08/27/2019, bilateral renal artery stenting 01/15/2022.  Carotid duplex May 2024 showed 1 to 39% bilateral ICA stenosis.  Renal artery ultrasound 11/2022 showed patent stents.  Former smoker, quit 2015, is followed by pulmonology for monitoring of multiple pulmonary nodules.  Prior bronchoscopy on 06/06/2021.  History of CKD 3.  Patient is currently admitted at [facility]  She presented to the ED on 11/24/2022 with complaint of foot pain.  Lower extremity ultrasound was negative for DVT.  Imaging the right foot and ankle showed no fracture or dislocation.  There was diffuse soft tissue swelling.  She was incidentally found to be hypoxic and was treated for COPD exacerbation with supplemental oxygen, budesonide, bronchodilators.  No prednisone due to history of previous GI bleed with prednisone.  She was advised to follow-up for scheduled bronchoscopy on 11/27/2022.  Patient will need day of surgery evaluation.  CMP and CBC 11/26/2022 reviewed, creatinine elevated 1.65, otherwise unremarkable.  EKG 11/25/2022: NSR.  T wave abnormality, consider anterior ischemia.  No significant change since last tracing.  CTA chest 11/25/2022: IMPRESSION: No evidence of pulmonary emboli.   Bilateral upper lobe nodular densities similar to that seen on prior exams. Again the lesion in the right upper lobe is suspicious for underlying carcinoma.   Aortic Atherosclerosis (ICD10-I70.0).  Carotid duplex 08/01/2022: Summary:  Right Carotid: Velocities in the right  ICA are consistent with a 1-39% stenosis. The ECA appears >50% stenosed.  Left Carotid: Velocities in the left ICA are consistent with a 1-39% stenosis.  Vertebrals: Bilateral vertebral arteries demonstrate antegrade flow.  Subclavians: Normal flow hemodynamics were seen in bilateral subclavian arteries.   Nuclear stress 02/01/22:   Findings are consistent with no prior ischemia. The study is low risk.   A pharmacological stress test was performed using IV Lexiscan 0.4mg  over 10 seconds performed without concurrent submaximal exercise. Hypotensive blood pressure response noted during stress.   No ST deviation was noted.   Left ventricular function is normal. Nuclear stress EF: 77 %. The left ventricular ejection fraction is hyperdynamic (>65%). Small LV cavity sizes at both end systole and end diastole.   Prior study not available for comparison.   Prominent RV uptake noted on study. Blood pressure dropped significantly with infusion of lexiscan (193/75 prior to infusion -> 134/62 with infusion -> 154/67 at end of recovery). Small chamber size with hyperdynamic LVEF, no evidence of ischemia.  TTE 01/08/2022: 1. Left ventricular ejection fraction, by estimation, is 60 to 65%. The  left ventricle has normal function. The left ventricle has no regional  wall motion abnormalities. Left ventricular diastolic parameters are  consistent with Grade II diastolic  dysfunction (pseudonormalization).   2. Right ventricular systolic function is mildly reduced. The right  ventricular size is mildly enlarged.   3. Right atrial size was moderately dilated.   4. The mitral valve is normal in structure. Trivial mitral valve  regurgitation. No evidence of mitral stenosis.   5. Tricuspid valve regurgitation is moderate to severe.   6. The aortic valve is tricuspid. There is moderate  thickening of the  aortic valve. Aortic valve regurgitation is mild to moderate. No aortic  stenosis is present.   7. The  inferior vena cava is normal in size with greater than 50%  respiratory variability, suggesting right atrial pressure of 3 mmHg.   Cath 02/11/2019:  The left ventricular systolic function is normal. The left ventricular ejection fraction is greater than 65% by visual estimate. LV end diastolic pressure is normal. --------------- Prox LAD lesion is 30% stenosed. Mid LAD to Dist LAD lesion is 40% stenosed with 40% stenosed side branch in 2nd Diag. Prox RCA lesion is 50% stenosed. Prox RCA to Mid RCA DES (2009) is 15% stenosed. RPAV lesion is 75% stenosed - too small for PTCA or PCI Minimal disease in LAD & LCx   SUMMARY Patent RCA stent with minimal ISR Moderate pRCA lesion & 75% ostial RPAV (very small caliber vessel) - could be potential culprits - recommend medical management. Normal LVEF & EDP.   RECOMMENDATIONS Discharge home after bedrest -- she should have f/u scheduled to discuss medical management Consider Pulmonary Cause for Dyspnea on Exertion -- Defer Pulmonary evaluation to PCP   Antionette Poles, PA-C [facility] Short Stay Center/Anesthesiology Phone [phone] 11/26/2022 4:25 PM

## 2022-11-26 NOTE — TOC CM/SW Note (Signed)
Transition of Care [facility]) - Inpatient Brief Assessment   Patient Details  Name: Kayla Mcclure MRN: [id number] Date of Birth: 12-18-1945  Transition of Care [facility]) CM/SW Contact:    Howell Rucks, RN Phone Number: 11/26/2022, 10:28 AM   Clinical Narrative: Met with pt at bedside to introduce role of TOC/NCM and review for dc planning. Pt confirmed PCP and pharmacy in place, no home DME or home car services, reports she lives with her spouse and feels safe returning home, confirmed has transportation available at discharge. TOC Brief Assessment completed. No TOC needs identified. MOON completed.     Transition of Care Asessment: Insurance and Status: Insurance coverage has been reviewed Patient has primary care physician: Yes Home environment has been reviewed: private residence with spouse Prior level of function:: Independent Prior/Current Home Services: No current home services Social Determinants of Health Reivew: SDOH reviewed no interventions necessary Readmission risk has been reviewed: Yes Transition of care needs: no transition of care needs at this time

## 2022-11-26 NOTE — Plan of Care (Signed)
  Problem: Education: Goal: Knowledge of General Education information will improve Description: Including pain rating scale, medication(s)/side effects and non-pharmacologic comfort measures Outcome: Not Progressing   Problem: Health Behavior/Discharge Planning: Goal: Ability to manage health-related needs will improve Outcome: Not Progressing   

## 2022-11-26 NOTE — Plan of Care (Signed)
  Problem: Education: Goal: Knowledge of General Education information will improve Description: Including pain rating scale, medication(s)/side effects and non-pharmacologic comfort measures 11/26/2022 1524 by Val Eagle, RN Outcome: Adequate for Discharge 11/26/2022 1524 by Val Eagle, RN Outcome: Adequate for Discharge 11/26/2022 1523 by Val Eagle, RN Outcome: Not Progressing 11/26/2022 1503 by Val Eagle, RN Outcome: Not Progressing   Problem: Health Behavior/Discharge Planning: Goal: Ability to manage health-related needs will improve 11/26/2022 1524 by Val Eagle, RN Outcome: Adequate for Discharge 11/26/2022 1524 by Val Eagle, RN Outcome: Adequate for Discharge 11/26/2022 1523 by Val Eagle, RN Outcome: Not Progressing 11/26/2022 1503 by Val Eagle, RN Outcome: Not Progressing   Problem: Clinical Measurements: Goal: Ability to maintain clinical measurements within normal limits will improve 11/26/2022 1524 by Val Eagle, RN Outcome: Adequate for Discharge 11/26/2022 1524 by Val Eagle, RN Outcome: Adequate for Discharge Goal: Will remain free from infection 11/26/2022 1524 by Val Eagle, RN Outcome: Adequate for Discharge 11/26/2022 1524 by Val Eagle, RN Outcome: Adequate for Discharge Goal: Diagnostic test results will improve 11/26/2022 1524 by Val Eagle, RN Outcome: Adequate for Discharge 11/26/2022 1524 by Val Eagle, RN Outcome: Adequate for Discharge Goal: Respiratory complications will improve 11/26/2022 1524 by Val Eagle, RN Outcome: Adequate for Discharge 11/26/2022 1524 by Val Eagle, RN Outcome: Adequate for Discharge Goal: Cardiovascular complication will be avoided 11/26/2022 1524 by Val Eagle, RN Outcome: Adequate for Discharge 11/26/2022 1524 by Val Eagle, RN Outcome: Adequate for Discharge   Problem: Activity: Goal: Risk for activity intolerance  will decrease 11/26/2022 1524 by Val Eagle, RN Outcome: Adequate for Discharge 11/26/2022 1524 by Val Eagle, RN Outcome: Adequate for Discharge   Problem: Nutrition: Goal: Adequate nutrition will be maintained 11/26/2022 1524 by Val Eagle, RN Outcome: Adequate for Discharge 11/26/2022 1524 by Val Eagle, RN Outcome: Adequate for Discharge   Problem: Coping: Goal: Level of anxiety will decrease 11/26/2022 1524 by Val Eagle, RN Outcome: Adequate for Discharge 11/26/2022 1524 by Val Eagle, RN Outcome: Adequate for Discharge   Problem: Elimination: Goal: Will not experience complications related to bowel motility 11/26/2022 1524 by Val Eagle, RN Outcome: Adequate for Discharge 11/26/2022 1524 by Val Eagle, RN Outcome: Adequate for Discharge Goal: Will not experience complications related to urinary retention 11/26/2022 1524 by Val Eagle, RN Outcome: Adequate for Discharge 11/26/2022 1524 by Val Eagle, RN Outcome: Adequate for Discharge   Problem: Pain Managment: Goal: General experience of comfort will improve 11/26/2022 1524 by Val Eagle, RN Outcome: Adequate for Discharge 11/26/2022 1524 by Val Eagle, RN Outcome: Adequate for Discharge   Problem: Safety: Goal: Ability to remain free from injury will improve 11/26/2022 1524 by Val Eagle, RN Outcome: Adequate for Discharge 11/26/2022 1524 by Val Eagle, RN Outcome: Adequate for Discharge   Problem: Skin Integrity: Goal: Risk for impaired skin integrity will decrease 11/26/2022 1524 by Val Eagle, RN Outcome: Adequate for Discharge 11/26/2022 1524 by Val Eagle, RN Outcome: Adequate for Discharge

## 2022-11-26 NOTE — Care Management Obs Status (Signed)
MEDICARE OBSERVATION STATUS NOTIFICATION   Patient Details  Name: Kayla Mcclure MRN: [id number] Date of Birth: 05-09-1945   Medicare Observation Status Notification Given:       Howell Rucks, RN 11/26/2022, 10:21 AM

## 2022-11-27 ENCOUNTER — Ambulatory Visit (HOSPITAL_COMMUNITY): Payer: Medicare Other

## 2022-11-27 ENCOUNTER — Other Ambulatory Visit: Payer: Self-pay

## 2022-11-27 ENCOUNTER — Encounter (HOSPITAL_COMMUNITY): Payer: Self-pay | Admitting: Pulmonary Disease

## 2022-11-27 ENCOUNTER — Ambulatory Visit (HOSPITAL_BASED_OUTPATIENT_CLINIC_OR_DEPARTMENT_OTHER): Payer: Medicare Other | Admitting: Certified Registered Nurse Anesthetist

## 2022-11-27 ENCOUNTER — Encounter (HOSPITAL_COMMUNITY)
Admission: RE | Disposition: A | Discharge status: Home / Self Care | Payer: Self-pay | Source: Home / Self Care | Attending: Pulmonary Disease

## 2022-11-27 ENCOUNTER — Ambulatory Visit (HOSPITAL_COMMUNITY): Payer: Medicare Other | Admitting: Certified Registered Nurse Anesthetist

## 2022-11-27 ENCOUNTER — Ambulatory Visit (HOSPITAL_COMMUNITY)
Admission: RE | Admit: 2022-11-27 | Discharge: 2022-11-27 | Disposition: A | Discharge status: Home / Self Care | Payer: Medicare Other | Attending: Pulmonary Disease | Admitting: Pulmonary Disease

## 2022-11-27 DIAGNOSIS — R918 Other nonspecific abnormal finding of lung field: Secondary | ICD-10-CM | POA: Diagnosis not present

## 2022-11-27 DIAGNOSIS — K219 Gastro-esophageal reflux disease without esophagitis: Secondary | ICD-10-CM | POA: Diagnosis not present

## 2022-11-27 DIAGNOSIS — I5032 Chronic diastolic (congestive) heart failure: Secondary | ICD-10-CM | POA: Insufficient documentation

## 2022-11-27 DIAGNOSIS — N183 Chronic kidney disease, stage 3 unspecified: Secondary | ICD-10-CM | POA: Diagnosis not present

## 2022-11-27 DIAGNOSIS — C3411 Malignant neoplasm of upper lobe, right bronchus or lung: Secondary | ICD-10-CM | POA: Insufficient documentation

## 2022-11-27 DIAGNOSIS — F17210 Nicotine dependence, cigarettes, uncomplicated: Secondary | ICD-10-CM | POA: Insufficient documentation

## 2022-11-27 DIAGNOSIS — I2510 Atherosclerotic heart disease of native coronary artery without angina pectoris: Secondary | ICD-10-CM | POA: Diagnosis not present

## 2022-11-27 DIAGNOSIS — I130 Hypertensive heart and chronic kidney disease with heart failure and stage 1 through stage 4 chronic kidney disease, or unspecified chronic kidney disease: Secondary | ICD-10-CM | POA: Diagnosis not present

## 2022-11-27 DIAGNOSIS — N189 Chronic kidney disease, unspecified: Secondary | ICD-10-CM | POA: Insufficient documentation

## 2022-11-27 DIAGNOSIS — J40 Bronchitis, not specified as acute or chronic: Secondary | ICD-10-CM | POA: Diagnosis not present

## 2022-11-27 DIAGNOSIS — J440 Chronic obstructive pulmonary disease with acute lower respiratory infection: Secondary | ICD-10-CM | POA: Diagnosis not present

## 2022-11-27 DIAGNOSIS — Z48813 Encounter for surgical aftercare following surgery on the respiratory system: Secondary | ICD-10-CM | POA: Diagnosis not present

## 2022-11-27 DIAGNOSIS — R911 Solitary pulmonary nodule: Secondary | ICD-10-CM

## 2022-11-27 DIAGNOSIS — I252 Old myocardial infarction: Secondary | ICD-10-CM | POA: Diagnosis not present

## 2022-11-27 DIAGNOSIS — Z8673 Personal history of transient ischemic attack (TIA), and cerebral infarction without residual deficits: Secondary | ICD-10-CM | POA: Insufficient documentation

## 2022-11-27 DIAGNOSIS — E785 Hyperlipidemia, unspecified: Secondary | ICD-10-CM | POA: Insufficient documentation

## 2022-11-27 DIAGNOSIS — Z955 Presence of coronary angioplasty implant and graft: Secondary | ICD-10-CM | POA: Insufficient documentation

## 2022-11-27 DIAGNOSIS — I739 Peripheral vascular disease, unspecified: Secondary | ICD-10-CM | POA: Diagnosis not present

## 2022-11-27 DIAGNOSIS — R590 Localized enlarged lymph nodes: Secondary | ICD-10-CM | POA: Diagnosis not present

## 2022-11-27 DIAGNOSIS — J4489 Other specified chronic obstructive pulmonary disease: Secondary | ICD-10-CM | POA: Diagnosis not present

## 2022-11-27 DIAGNOSIS — I700 Atherosclerosis of aorta: Secondary | ICD-10-CM | POA: Diagnosis not present

## 2022-11-27 HISTORY — PX: FIDUCIAL MARKER PLACEMENT: SHX6858

## 2022-11-27 HISTORY — PX: BRONCHIAL NEEDLE ASPIRATION BIOPSY: SHX5106

## 2022-11-27 HISTORY — PX: ENDOBRONCHIAL ULTRASOUND: SHX5096

## 2022-11-27 HISTORY — PX: BRONCHIAL BIOPSY: SHX5109

## 2022-11-27 SURGERY — BRONCHOSCOPY, WITH BIOPSY USING ELECTROMAGNETIC NAVIGATION
Anesthesia: General | Laterality: Right

## 2022-11-27 MED ORDER — CHLORHEXIDINE GLUCONATE 0.12 % MT SOLN
15.0000 mL | Freq: Once | OROMUCOSAL | Status: AC
  Administered 2022-11-27: 15 mL via OROMUCOSAL

## 2022-11-27 MED ORDER — LIDOCAINE 2% (20 MG/ML) 5 ML SYRINGE
INTRAMUSCULAR | Status: DC | PRN
  Administered 2022-11-27: 80 mg via INTRAVENOUS

## 2022-11-27 MED ORDER — ACETAMINOPHEN 500 MG PO TABS: 1000.0000 mg | ORAL_TABLET | Freq: Once | ORAL | Status: AC

## 2022-11-27 MED ORDER — ACETAMINOPHEN 500 MG PO TABS
ORAL_TABLET | ORAL | Status: AC
  Administered 2022-11-27: 1000 mg via ORAL
  Filled 2022-11-27: qty 2

## 2022-11-27 MED ORDER — ROCURONIUM BROMIDE 10 MG/ML (PF) SYRINGE
PREFILLED_SYRINGE | INTRAVENOUS | Status: DC | PRN
  Administered 2022-11-27: 50 mg via INTRAVENOUS

## 2022-11-27 MED ORDER — FENTANYL CITRATE (PF) 100 MCG/2ML IJ SOLN
INTRAMUSCULAR | Status: DC | PRN
  Administered 2022-11-27: 50 ug via INTRAVENOUS

## 2022-11-27 MED ORDER — DEXAMETHASONE SODIUM PHOSPHATE 10 MG/ML IJ SOLN
INTRAMUSCULAR | Status: DC | PRN
  Administered 2022-11-27: 5 mg via INTRAVENOUS

## 2022-11-27 MED ORDER — PROPOFOL 500 MG/50ML IV EMUL
INTRAVENOUS | Status: DC | PRN
  Administered 2022-11-27: 75 ug/kg/min via INTRAVENOUS

## 2022-11-27 MED ORDER — SUGAMMADEX SODIUM 200 MG/2ML IV SOLN
INTRAVENOUS | Status: DC | PRN
  Administered 2022-11-27: 150 mg via INTRAVENOUS

## 2022-11-27 MED ORDER — PROPOFOL 10 MG/ML IV BOLUS
INTRAVENOUS | Status: DC | PRN
  Administered 2022-11-27: 110 mg via INTRAVENOUS

## 2022-11-27 MED ORDER — LACTATED RINGERS IV SOLN: INTRAVENOUS | Status: DC

## 2022-11-27 MED ORDER — ONDANSETRON HCL 4 MG/2ML IJ SOLN
INTRAMUSCULAR | Status: DC | PRN
Start: 2022-11-27 — End: 2022-11-27
  Administered 2022-11-27: 4 mg via INTRAVENOUS

## 2022-11-27 SURGICAL SUPPLY — 1 items: superlock fiducial marker IMPLANT

## 2022-11-27 NOTE — Discharge Instructions (Signed)
Flexible Bronchoscopy, Care After This sheet gives you information about how to care for yourself after your test. Your doctor may also give you more specific instructions. If you have problems or questions, contact your doctor. Follow these instructions at home: Eating and drinking Do not eat or drink anything (not even water) for 2 hours after your test, or until your numbing medicine (local anesthetic) wears off. When your numbness is gone and your cough and gag reflexes have come back, you may: Eat only soft foods. Slowly drink liquids. The day after the test, go back to your normal diet. Driving Do not drive for 24 hours if you were given a medicine to help you relax (sedative). Do not drive or use heavy machinery while taking prescription pain medicine. General instructions  Take over-the-counter and prescription medicines only as told by your doctor. Return to your normal activities as told. Ask what activities are safe for you. Do not use any products that have nicotine or tobacco in them. This includes cigarettes and e-cigarettes. If you need help quitting, ask your doctor. Keep all follow-up visits as told by your doctor. This is important. It is very important if you had a tissue sample (biopsy) taken. Get help right away if: You have shortness of breath that gets worse. You get light-headed. You feel like you are going to pass out (faint). You have chest pain. You cough up: More than a little blood. More blood than before. Summary Do not eat or drink anything (not even water) for 2 hours after your test, or until your numbing medicine wears off. Do not use cigarettes. Do not use e-cigarettes. Get help right away if you have chest pain.  This information is not intended to replace advice given to you by your health care provider. Make sure you discuss any questions you have with your health care provider. Document Released: 01/14/2009 Document Revised: 03/01/2017 Document  Reviewed: 04/06/2016 Elsevier Patient Education  2020 Elsevier Inc.  

## 2022-11-27 NOTE — Transfer of Care (Signed)
Immediate Anesthesia Transfer of Care Note  Patient: Kayla Mcclure  Procedure(s) Performed: ROBOTIC ASSISTED NAVIGATIONAL BRONCHOSCOPY WITH BIOPSY (Right) ENDOBRONCHIAL ULTRASOUND (Bilateral) BRONCHIAL NEEDLE ASPIRATION BIOPSIES FIDUCIAL MARKER PLACEMENT BRONCHIAL BIOPSIES  Patient Location: PACU  Anesthesia Type:General  Level of Consciousness: drowsy and patient cooperative  Airway & Oxygen Therapy: Patient Spontanous Breathing and Patient connected to face mask oxygen  Post-op Assessment: Report given to RN and Post -op Vital signs reviewed and stable  Post vital signs: Reviewed and stable  Last Vitals:  Vitals Value Taken Time  BP    Temp    Pulse 76 11/27/22 1256  Resp 17 11/27/22 1256  SpO2 100 % 11/27/22 1256  Vitals shown include unfiled device data.  Last Pain:  Vitals:   11/27/22 0957  TempSrc: Oral  PainSc: 0-No pain         Complications: No notable events documented.

## 2022-11-27 NOTE — Interval H&P Note (Signed)
History and Physical Interval Note:  11/27/2022 10:17 AM  Kayla Mcclure  has presented today for surgery, with the diagnosis of RIGHT LUNG APEX MASS.  The various methods of treatment have been discussed with the patient and family. After consideration of risks, benefits and other options for treatment, the patient has consented to  Procedure(s): ROBOTIC ASSISTED NAVIGATIONAL BRONCHOSCOPY WITH BIOPSY (Right) as a surgical intervention.  The patient's history has been reviewed, patient examined, no change in status, stable for surgery.  I have reviewed the patient's chart and labs.  Questions were answered to the patient's satisfaction.     Rachel Bo Tenesia Escudero

## 2022-11-27 NOTE — Anesthesia Procedure Notes (Signed)
Procedure Name: Intubation Date/Time: 11/27/2022 11:53 AM  Performed by: Owens Loffler, RNPre-anesthesia Checklist: Patient identified, Emergency Drugs available, Suction available and Patient being monitored Patient Re-evaluated:Patient Re-evaluated prior to induction Oxygen Delivery Method: Circle system utilized Preoxygenation: Pre-oxygenation with 100% oxygen Induction Type: IV induction Ventilation: Mask ventilation without difficulty Laryngoscope Size: Mac and 3 Grade View: Grade I Tube type: Oral Tube size: 8.5 mm Number of attempts: 1 Airway Equipment and Method: Stylet and Oral airway Placement Confirmation: ETT inserted through vocal cords under direct vision, positive ETCO2 and breath sounds checked- equal and bilateral Secured at: 22 cm Tube secured with: Tape Dental Injury: Teeth and Oropharynx as per pre-operative assessment

## 2022-11-27 NOTE — Op Note (Addendum)
Video Bronchoscopy with Robotic Assisted Bronchoscopic Navigation  Video Bronchoscopy with Endobronchial Ultrasound Procedure Note  Date of Operation: 11/27/2022   Pre-op Diagnosis: lung nodule, adenopathy  Post-op Diagnosis: lung nodule, adenopathy   Surgeon: Josephine Igo, DO   Assistants: None   Anesthesia: General endotracheal anesthesia  Operation: Flexible video fiberoptic bronchoscopy with robotic assistance and biopsies.  Estimated Blood Loss: Minimal  Complications: None  Indications and History: Kayla Mcclure is a 77 y.o. female with history of lung nodule. The risks, benefits, complications, treatment options and expected outcomes were discussed with the patient.  The possibilities of pneumothorax, pneumonia, reaction to medication, pulmonary aspiration, perforation of a viscus, bleeding, failure to diagnose a condition and creating a complication requiring transfusion or operation were discussed with the patient who freely signed the consent.    Description of Procedure: The patient was seen in the Preoperative Area, was examined and was deemed appropriate to proceed.  The patient was taken to [facility] endoscopy room 3, identified as Kayla Mcclure and the procedure verified as Flexible Video Fiberoptic Bronchoscopy.  A Time Out was held and the above information confirmed.   Prior to the date of the procedure a high-resolution CT scan of the chest was performed. Utilizing ION software program a virtual tracheobronchial tree was generated to allow the creation of distinct navigation pathways to the patient's parenchymal abnormalities. After being taken to the operating room general anesthesia was initiated and the patient  was orally intubated. The video fiberoptic bronchoscope was introduced via the endotracheal tube and a general inspection was performed which showed normal right and left lung anatomy, aspiration of the bilateral mainstems was completed to remove  any remaining secretions. Robotic catheter inserted into patient's endotracheal tube.   Target #1 Station 7: The video fiberoptic bronchoscope was introduced via the endotracheal tube and a general inspection was performed which showed normal airways. The standard scope was then withdrawn and the endobronchial ultrasound was used to identify and characterize the peritracheal, hilar and bronchial lymph nodes. Inspection showed enlarged station 7, the paratracheal node on previous images were. Using real-time ultrasound guidance Wang needle biopsies were take from Station 7 nodes and were sent for cytology.  We then completed examination of the paratracheal region which was seen on her previous images.  I do not see any obvious node.  We had little difficulty having good opposition of the ultrasound probe in that area but felt like we were at least able to examine enough to not identify any concerning lymph nodes.  Target #2 right upper lobe nodule: The distinct navigation pathways prepared prior to this procedure were then utilized to navigate to patient's lesion identified on CT scan. The robotic catheter was secured into place and the vision probe was withdrawn.  Lesion location was approximated using fluoroscopy and three-dimensional cone beam CT imaging for CT-guided needle placement and for peripheral targeting. Under fluoroscopic guidance, transbronchial needle biopsies, and transbronchial forceps biopsies were performed to be sent for cytology and pathology.  Following tissue sampling a single fiducial was placed within proximity of the lesion using the fiducial catheter wire and delivery kit.  At the end of the procedure a general airway inspection was performed and there was no evidence of active bleeding. The bronchoscope was removed.  The patient tolerated the procedure well. There was no significant blood loss and there were no obvious complications. A post-procedural chest x-ray is  pending.  Samples Target #1: 1. Wang needle biopsies from Station 7  node  Samples Target #2: 1. Transbronchial Wang needle biopsies from RUL  2. Transbronchial forceps biopsies from RUL  Plans:  The patient will be discharged from the PACU to home when recovered from anesthesia and after chest x-ray is reviewed. We will review the cytology, pathology results with the patient when they become available. Outpatient followup will be with Josephine Igo, DO   Josephine Igo, DO [facility] 11/27/2022 1:23 PM

## 2022-11-27 NOTE — H&P (Signed)
History of Present Illness Kayla Mcclure is a 77 y.o. female  former smoker  with past medical history of coronary disease, NSTEMI, CKD, chronic diastolic heart failure, history of MI, history of stroke. She is followed by the lung cancer screening program. She had an abnormal low dose CT Chest, and PET scan . She was seen by Dr. Tonia Brooms for bronchoscopy with tissue sampling on 06/06/2021. Biopsies were negative. Plan was for short term follow up imaging.        02/02/2022 Pt. Presents for  follow up after short term follow up after her  CT Chest. Her follow up CT Chest  shows stable nodules with the exception of some progressive nodularity in the right upper lobe including a new 0.6 x 0.7 cm right upper lobe nodule.  There is comment by radiology that these nodules could be due to atypical infection or entity such as sarcoidosis or neoplasm. There was also notation of cyclic bronchiectasis in both lungs, favoring upper lobes. Mild cardiomegaly, and a prominent main pulmonary artery suggesting pulmonary artery hypertension.  Patient is followed by cardiology.  Last echo was done October 2023 which does show increased right heart pressures however per Dr. Elissa Hefty note, both of these findings are stable when compared to the 2021 echo. Patient is currently not actively treating her bronchiectasis.  She states she coughs up thick white to green secretions every morning that are very difficult to get up.  She denies any fever.  She states she smokes about 1 pack of cigarettes a week.  I have asked her to work very hard on quitting.  She states she uses albuterol 1-2 times a week just as needed for shortness of breath or wheezing   11/27/2022: here today for repeat bronchoscopy. We will start with EBUS and possible NAV     04/12/2021 Low-dose screening CT Lung-RADS 4B, suspicious. Additional imaging evaluation or consultation with Pulmonology or Thoracic Surgery recommended. New nodules in  both upper lobes and the right lower lobe. Infectious and malignant etiologies are considered. These results will be called to the ordering clinician or representative by the Radiologist Assistant, and communication documented in the PACS or [employer]. 2. Aortic atherosclerosis (ICD10-I70.0). Coronary artery calcification. 3.  Emphysema (ICD10-J43.9). 4. Enlarged pulmonic trunk, indicative of pulmonary arterial hypertension.    Cytology 06/06/2021 LUL and RLL   FINAL MICROSCOPIC DIAGNOSIS:  A. LUNG, LUL, FINE NEEDLE ASPIRATION:  - No malignant cells identified   B. LUNG, LUL, BRUSH:  - No malignant cells identified    FINAL MICROSCOPIC DIAGNOSIS:  D. LUNG, RLL, FINE NEEDLE ASPIRATION:  - No malignant cells identified.   COMMENT:   Occasional giant cells are present, and there is a suggestion of a rare  granuloma.      Patient is a current some day smoker She smokes 1 pack of cigarettes per week Patient has a greater than 20-pack-year smoking history   Tobacco Cessation: Counseled on smoking cessation 02/02/2022 Past surgical hx, Family hx, Social hx all reviewed.       Current Outpatient Medications on File Prior to Visit  Medication Sig   albuterol (VENTOLIN HFA) 108 (90 Base) MCG/ACT inhaler Inhale 2 puffs into the lungs every 6 (six) hours as needed for wheezing or shortness of breath.   alendronate (FOSAMAX) 70 MG tablet Take 70 mg by mouth once a week.   amLODipine (NORVASC) 5 MG tablet Take 1 tablet (5 mg total) by mouth daily.   aspirin  EC 81 MG tablet Take 1 tablet (81 mg total) by mouth daily.   atorvastatin (LIPITOR) 80 MG tablet Take 1 tablet (80 mg total) by mouth daily.   clopidogrel (PLAVIX) 75 MG tablet Take 1 tablet (75 mg total) by mouth daily.   ezetimibe (ZETIA) 10 MG tablet Take 1 tablet (10 mg total) by mouth daily.   ferrous gluconate (FERGON) 324 MG tablet Take 324 mg by mouth daily with breakfast.   furosemide (LASIX) 20 MG tablet Take 20  mg  One tablet on Monday , Wednesday , Fridays , may take an additional  20 mg ff needed   isosorbide mononitrate (IMDUR) 60 MG 24 hr tablet TAKE 1 TABLET BY MOUTH  DAILY   metoprolol succinate (TOPROL-XL) 100 MG 24 hr tablet Take 2 tablets (200 mg total) by mouth at bedtime.   nitroGLYCERIN (NITROSTAT) 0.4 MG SL tablet Place 1 tablet (0.4 mg total) under the tongue every 5 (five) minutes as needed for chest pain.   ondansetron (ZOFRAN) 4 MG tablet Take 1 tablet (4 mg total) by mouth every 6 (six) hours as needed for nausea.   pantoprazole (PROTONIX) 40 MG tablet Take 1 tablet (40 mg total) by mouth daily.   raloxifene (EVISTA) 60 MG tablet Take 60 mg by mouth daily.   valsartan (DIOVAN) 80 MG tablet Take 1 tablet (80 mg total) by mouth daily.      No current facility-administered medications on file prior to visit.        Allergies  No Known Allergies     Review Of Systems:   Review of Systems  Constitutional:  Negative for chills, fever, malaise/fatigue and weight loss.  HENT:  Negative for hearing loss, sore throat and tinnitus.   Eyes:  Negative for blurred vision and double vision.  Respiratory:  Negative for cough, hemoptysis, sputum production, shortness of breath, wheezing and stridor.   Cardiovascular:  Negative for chest pain, palpitations, orthopnea, leg swelling and PND.  Gastrointestinal:  Negative for abdominal pain, constipation, diarrhea, heartburn, nausea and vomiting.  Genitourinary:  Negative for dysuria, hematuria and urgency.  Musculoskeletal:  Negative for joint pain and myalgias.  Skin:  Negative for itching and rash.  Neurological:  Negative for dizziness, tingling, weakness and headaches.  Endo/Heme/Allergies:  Negative for environmental allergies. Does not bruise/bleed easily.  Psychiatric/Behavioral:  Negative for depression. The patient is not nervous/anxious and does not have insomnia.   All other systems reviewed and are negative.       BP (!) 145/67    Pulse 73   Temp 98.3 F (36.8 C)   Resp 18   Ht 4\' 11"  (1.499 m)   Wt 55.3 kg   SpO2 94%   BMI 24.64 kg/m   General appearance: 77 y.o., female, NAD, conversant  Eyes: anicteric sclerae, moist conjunctivae; no lid-lag; PERRLA, tracking appropriately HENT: NCAT; oropharynx, MMM, no mucosal ulcerations; normal hard and soft palate Neck: Trachea midline; FROM, supple, lymphadenopathy, no JVD Lungs: CTAB, no crackles, no wheeze, with normal respiratory effort and no intercostal retractions CV: RRR, S1, S2, no MRGs  Abdomen: Soft, non-tender; non-distended, BS present  Extremities: No peripheral edema, radial and DP pulses present bilaterally  Skin: Normal temperature, turgor and texture; no rash Psych: Appropriate affect Neuro: Alert and oriented to person and place, no focal deficit       Assessment/Plan Lung nodules per CT imaging Previously biopsied in 05/2021, negative both sides Current someday smoker/tobacco abuse Recent imaging with progressive nodularity in the  right upper lobe Plan Here today for bronchoscopy  We discussed the risk and benefits  We will plan for EBUS followed by possible NAV   Josephine Igo, DO [facility] 11/27/2022 10:17 AM

## 2022-11-28 ENCOUNTER — Other Ambulatory Visit: Payer: Self-pay | Admitting: Cardiology

## 2022-11-28 LAB — CYTOLOGY - NON PAP

## 2022-11-28 NOTE — Anesthesia Postprocedure Evaluation (Signed)
Anesthesia Post Note  Patient: Kayla Mcclure  Procedure(s) Performed: ROBOTIC ASSISTED NAVIGATIONAL BRONCHOSCOPY WITH BIOPSY (Right) ENDOBRONCHIAL ULTRASOUND (Bilateral) BRONCHIAL NEEDLE ASPIRATION BIOPSIES FIDUCIAL MARKER PLACEMENT BRONCHIAL BIOPSIES     Patient location during evaluation: PACU Anesthesia Type: General Level of consciousness: awake and alert Pain management: pain level controlled Vital Signs Assessment: post-procedure vital signs reviewed and stable Respiratory status: spontaneous breathing, nonlabored ventilation, respiratory function stable and patient connected to nasal cannula oxygen Cardiovascular status: blood pressure returned to baseline and stable Postop Assessment: no apparent nausea or vomiting Anesthetic complications: no   No notable events documented.  Last Vitals:  Vitals:   11/27/22 1330 11/27/22 1359  BP: (!) 113/55 (!) 121/57  Pulse: 71 79  Resp: (!) 23 19  Temp:    SpO2: 96% 100%    Last Pain:  Vitals:   11/27/22 1359  TempSrc:   PainSc: 0-No pain                 Collene Schlichter

## 2022-11-29 ENCOUNTER — Encounter (HOSPITAL_COMMUNITY): Payer: Self-pay | Admitting: Pulmonary Disease

## 2022-12-05 ENCOUNTER — Ambulatory Visit: Payer: Medicare Other | Admitting: Acute Care

## 2022-12-05 ENCOUNTER — Encounter: Payer: Self-pay | Admitting: Acute Care

## 2022-12-05 VITALS — BP 130/64 | HR 70 | Ht 59.0 in | Wt 117.8 lb

## 2022-12-05 DIAGNOSIS — C3411 Malignant neoplasm of upper lobe, right bronchus or lung: Secondary | ICD-10-CM | POA: Diagnosis not present

## 2022-12-05 DIAGNOSIS — F17210 Nicotine dependence, cigarettes, uncomplicated: Secondary | ICD-10-CM

## 2022-12-05 DIAGNOSIS — Z9889 Other specified postprocedural states: Secondary | ICD-10-CM

## 2022-12-05 DIAGNOSIS — F17200 Nicotine dependence, unspecified, uncomplicated: Secondary | ICD-10-CM

## 2022-12-05 NOTE — Progress Notes (Signed)
Thoracic Location of Tumor / Histology: RUL Lung  Patient has been followed in the lung cancer screening program and had an abnormal low dose CT chest and PET scan in 2023.  Bronchoscopy 06/06/2021 revealed negative tissue.  She was followed with short term imaging.  Bronchoscopy 11/27/2022:   PET 11/09/2022: 2.9 cm irregular nodule in the medial right lung apex, suspicious for primary bronchogenic carcinoma.  Possible right paratracheal and right perihilar nodal metastases, equivocal.  Additional bilateral upper lobe opacities favor sequela of chronic infection/inflammation, possibly chronic atypical mycobacterial infection.  No evidence of metastatic disease in the abdomen/pelvis.  CT Chest 10/09/2022: aggressive appearing right upper lobe nodule concerning for neoplasm.    Biopsies of RUL Lung / Lymph Node station 7    Past/Anticipated interventions by pulmonary, if any:  Kayla Mcclure 12/05/2022 -We discussed her biopsy results.  -The biopsy of the right upper lobe pulmonary nodule was positive for squamous cell carcinoma.  -We also discussed that the biopsy of the #7 lymph node was negative for malignancy.  -She is not interested in surgery, and I am unsure with her age and her co-morbidities she would qualify.  -Plan is for referral to radiation oncology for consideration of SBRT.    Past/Anticipated interventions by cardiothoracic surgery, if any:   Past/Anticipated interventions by medical oncology, if any:   Tobacco/Marijuana/Snuff/ETOH use: Former Smoker   Signs/Symptoms Weight changes, if any:  Respiratory complaints, if any:  Hemoptysis, if any:  Pain issues, if any:    SAFETY ISSUES: Prior radiation?  Pacemaker/ICD?   Possible current pregnancy? Postmenopausal Is the patient on methotrexate?   Current Complaints / other details:

## 2022-12-05 NOTE — Patient Instructions (Addendum)
It is good to see you today. Your biopsy was positive for squamous cell lung cancer in the right upper lobe. I have made a referral to radiation oncology for treatment.  You will get a call to get this scheduled.  They will take great care of you.  Good luck with treatment.  Please work on quitting smoking. Try Hypnosis, or call and get free nicotine replacement therapy at the number below. .   You can receive free nicotine replacement therapy ( patches, gum or mints) by calling 1-800-QUIT NOW. Please call so we can get you on the path to becoming  a non-smoker. I know it is hard, but you can do this!  Other options for assistance in smoking cessation ( As covered by your insurance benefits)  Hypnosis for smoking cessation  [employer] [phone]  Acupuncture for smoking cessation  [employer] [phone]

## 2022-12-05 NOTE — Progress Notes (Signed)
History of Present Illness Kayla Mcclure is a 77 y.o. female   former smoker  with past medical history of pulmonary nodules suspicious for malignancy, coronary disease, NSTEMI, CKD, chronic diastolic heart failure, history of MI, history of stroke. She is followed by the lung cancer screening program, and Dr. Tonia Brooms. . She had an abnormal low dose CT Chest, and PET scan in 2023 . She was seen by Dr. Tonia Brooms for bronchoscopy with tissue sampling on 06/06/2021. Biopsies were negative. Plan was for short term follow up imaging.   Synopsis Kayla Mcclure is a 77 y.o. female  former smoker  with past medical history of coronary disease, NSTEMI, CKD, chronic diastolic heart failure, history of MI, history of stroke. She is followed by the lung cancer screening program. She had an abnormal low dose CT Chest, and PET scan 05/2021 . She was seen by Dr. Tonia Brooms for bronchoscopy with tissue sampling on 06/06/2021. Biopsies were negative. Plan was for short term follow up imaging which was done 01/2022, and 04/2022 and 10/2022. Initial scanning showed some increase in size, but also suspicion that the nodules of concern were infection or inflammation, however after treatment,repeat Ct chest 10/2022 showed an agressive appearing right upper lobe nodule concerning for neoplasm. PET scan was done, which showed 2.9 cm irregular nodule in the medial right lung apex, suspicious for primary bronchogenic carcinoma, with possible right paratracheal and right perihilar metastasis. She made the decision to undergo a second Flexible video fiberoptic bronchoscopy with robotic assistance and biopsies which was scheduled for 11/27/2022.  She is here today for follow up to ensure she has done well after biopsies and to discuss results of her biopsy results.     12/05/2022 Pt. Presents for follow up after bronchoscopy with biopsies. She is here today with 3 members of her family for support. She states she has done well after the  bronchoscopy. No fever, hemoptysis, discolored secretions or shortness of breath. She did have a sore throat for a day after the procedure. This has self resolved.  We discussed her biopsy results. The biopsy of the right upper lobe pulmonary nodule was positive for squamous cell carcinoma. We also discussed that the biopsy of the #7 lymph node was negative for malignancy. She is not interested in surgery, and I am unsure with her age and her co-morbidities she would qualify. Plan is for referral to radiation oncology for consideration of SBRT. She and her family are in agreement with this plan.   She is still smoking. She uses albuterol as needed. She only uses it about once a month. We discussed that she needs to quit smoking completely. She states she is trying and has cut down to 3 cigarettes daily since her bronchoscopy. We have discussed nicotine replacement therapy as well as hypnosis , acupuncture and medications. She states she will try the patches or hypnosis. She does understand the need to quit entirely.   Test Results: 11/27/2022 Cytology FINAL MICROSCOPIC DIAGNOSIS:  A. LYMPH NODE, STATION 7, FINE NEEDLE ASPIRATION:  - No malignant cells identified  - Lymphoid tissue present   LUNG, RUL, FINE NEEDLE ASPIRATION  BIOPSY:  - Squamous cell carcinoma  - See comment  There is insufficient tumor cellularity in the cell block for additional  testing.    PET 11/09/2022 2.9 cm irregular nodule in the medial right lung apex, suspicious for primary bronchogenic carcinoma.   Possible right paratracheal and right perihilar nodal metastases, equivocal.   Additional bilateral  upper lobe opacities favor sequela of chronic infection/inflammation, possibly chronic atypical mycobacterial infection.   No evidence of metastatic disease in the abdomen/pelvis.   10/09/2022 CT Chest  Enlarging aggressive appearing right upper lobe pulmonary nodule concerning for neoplasm. Further evaluation with  PET-CT and/or biopsy should be considered if clinically appropriate. 2. Imaging findings in the lungs suggestive of probable chronic indolent atypical infectious process redemonstrated, as above. 3. Aortic atherosclerosis, in addition to left main and three-vessel coronary artery disease. Assessment for potential risk factor modification, dietary therapy or pharmacologic therapy may be warranted, if clinically indicated. 4. Cardiomegaly with right atrial dilatation.   04/2022 CT Chest Two adjacent solid pulmonary nodules of the right upper lobe are increased in size when compared with prior exam and new when compared with September 15, 2021 prior. Possibly due to worsened atypical infection, although primary lung neoplasm is also a consideration. Consider further evaluation with tissue sampling. 2. Scattered areas of upper lobe predominant cystic bronchiectasis and other previously seen solid pulmonary nodules are unchanged when compared with prior and likely sequela of chronic atypical infection. 3. Aortic Atherosclerosis (ICD10-I70.0). 01/29/2022 CT Chest The dominant right perifissural nodule is stable in size from 09/15/2021, currently 1.1 by 0.7 cm and previously had a maximum SUV of 4.6. 2. The left upper lobe nodule is stable in size compared to 09/15/2021, and previously had a maximum SUV of 4.5. 3. There is some progressive nodularity in the right upper lobe including a new 0.6 by 0.7 cm right upper lobe nodule. 4. These nodules could be due to atypical infection, entity such as sarcoidosis, or neoplastic. 5. Other scattered pulmonary nodules are stable. 6. Stable mild mediastinal adenopathy. 7. Substantial airway thickening with scattered regions of cystic or varicoid bronchiectasis in both lungs, favoring the upper lobes. 8. Prominent main pulmonary artery suggesting pulmonary arterial hypertension. 9. Mild cardiomegaly. Coronary, aortic arch, and branch  vessel atherosclerotic vascular disease. 10. Thoracic spondylosis. 11. Benign Bosniak category 2 left renal cyst. This warrants no further workup.   Aortic Atherosclerosis (ICD10-I70.0).  04/12/2021 Low-dose screening CT Lung-RADS 4B, suspicious. Additional imaging evaluation or consultation with Pulmonology or Thoracic Surgery recommended. New nodules in both upper lobes and the right lower lobe. Infectious and malignant etiologies are considered. These results will be called to the ordering clinician or representative by the Radiologist Assistant, and communication documented in the PACS or [employer]. 2. Aortic atherosclerosis (ICD10-I70.0). Coronary artery calcification. 3.  Emphysema (ICD10-J43.9). 4. Enlarged pulmonic trunk, indicative of pulmonary arterial hypertension.  09/18/2021 CT chest without contrast No significant change in previously demonstrated bilateral pulmonary nodularity. Given associated chronic lung disease, relative stability and previous biopsy results, findings may reflect atypical infection such as atypical mycobacterial infection. Continued CT follow-up recommended. Previously identified mediastinal adenopathy appears slightly improved. No new findings identified. Coronary and Aortic Atherosclerosis (ICD10-I70.0).      Cytology 06/06/2021 LUL and RLL   FINAL MICROSCOPIC DIAGNOSIS:  A. LUNG, LUL, FINE NEEDLE ASPIRATION:  - No malignant cells identified   B. LUNG, LUL, BRUSH:  - No malignant cells identified    FINAL MICROSCOPIC DIAGNOSIS:  D. LUNG, RLL, FINE NEEDLE ASPIRATION:  - No malignant cells identified.      Latest Ref Rng & Units 11/26/2022    4:09 AM 11/25/2022    7:43 PM 11/25/2022   12:59 AM  CBC  WBC 4.0 - 10.5 K/uL 6.8  5.7  9.2   Hemoglobin 12.0 - 15.0 g/dL 40.9  81.1  12.1  Hematocrit 36.0 - 46.0 % 40.0  37.3  38.0   Platelets 150 - 400 K/uL 177  174  195        Latest Ref Rng & Units 11/26/2022    4:09 AM  11/25/2022   12:59 AM 03/23/2022    3:25 AM  BMP  Glucose 70 - 99 mg/dL 92  98  88   BUN 8 - 23 mg/dL 20  23  19    Creatinine 0.44 - 1.00 mg/dL 7.82  9.56  2.13   Sodium 135 - 145 mmol/L 135  139  139   Potassium 3.5 - 5.1 mmol/L 4.7  4.3  4.2   Chloride 98 - 111 mmol/L 105  106  107   CO2 22 - 32 mmol/L 25  25  27    Calcium 8.9 - 10.3 mg/dL 8.6  9.3  8.9     BNP    Component Value Date/Time   BNP 472.1 (H) 11/25/2022 0258    ProBNP No results found for: "PROBNP"  PFT No results found for: "FEV1PRE", "FEV1POST", "FVCPRE", "FVCPOST", "TLC", "DLCOUNC", "PREFEV1FVCRT", "PSTFEV1FVCRT"  DG Chest Port 1 View  Result Date: 11/27/2022 CLINICAL DATA:  Post bronchoscopy. EXAM: PORTABLE CHEST 1 VIEW COMPARISON:  03/19/2022 FINDINGS: Heart size and pulmonary vascularity are normal. Peribronchial thickening with increased interstitial changes likely representing chronic bronchitis. No airspace disease or consolidation. No pleural effusions. No pneumothorax. Mediastinal contours appear intact. Calcification of the aorta. IMPRESSION: Bronchitic changes in the lungs.  No focal consolidation. Electronically Signed   By: Burman Nieves M.D.   On: 11/27/2022 15:16   DG C-ARM BRONCHOSCOPY  Result Date: 11/27/2022 C-ARM BRONCHOSCOPY: Fluoroscopy was utilized by the requesting physician.  No radiographic interpretation.   VAS [place] LOWER EXTREMITY VENOUS (DVT)  Result Date: 11/25/2022  Lower Venous DVT Study Patient Name:  Kayla Mcclure  Date of Exam:   11/25/2022 Medical Rec #: [id number]              Accession #:    [id number] Date of Birth: [birth year]               Patient Gender: F Patient Age:   89 years Exam Location:  [facility] Procedure:      VAS [place] LOWER EXTREMITY VENOUS (DVT) Referring Phys: DAVID ORTIZ --------------------------------------------------------------------------------  Indications: Swelling.  Risk Factors: None identified. Comparison Study: No prior studies.  Performing Technologist: Chanda Busing RVT  Examination Guidelines: A complete evaluation includes B-mode imaging, spectral Doppler, color Doppler, and power Doppler as needed of all accessible portions of each vessel. Bilateral testing is considered an integral part of a complete examination. Limited examinations for reoccurring indications may be performed as noted. The reflux portion of the exam is performed with the patient in reverse Trendelenburg.  +---------+---------------+---------+-----------+----------+--------------+ RIGHT    CompressibilityPhasicitySpontaneityPropertiesThrombus Aging +---------+---------------+---------+-----------+----------+--------------+ CFV      Full           Yes      Yes                                 +---------+---------------+---------+-----------+----------+--------------+ SFJ      Full                                                        +---------+---------------+---------+-----------+----------+--------------+  FV Prox  Full                                                        +---------+---------------+---------+-----------+----------+--------------+ FV Mid   Full                                                        +---------+---------------+---------+-----------+----------+--------------+ FV DistalFull                                                        +---------+---------------+---------+-----------+----------+--------------+ PFV      Full                                                        +---------+---------------+---------+-----------+----------+--------------+ POP      Full           Yes      Yes                                 +---------+---------------+---------+-----------+----------+--------------+ PTV      Full                                                        +---------+---------------+---------+-----------+----------+--------------+ PERO     Full                                                         +---------+---------------+---------+-----------+----------+--------------+   +---------+---------------+---------+-----------+----------+--------------+ LEFT     CompressibilityPhasicitySpontaneityPropertiesThrombus Aging +---------+---------------+---------+-----------+----------+--------------+ CFV      Full           Yes      Yes                                 +---------+---------------+---------+-----------+----------+--------------+ SFJ      Full                                                        +---------+---------------+---------+-----------+----------+--------------+ FV Prox  Full                                                        +---------+---------------+---------+-----------+----------+--------------+  FV Mid   Full                                                        +---------+---------------+---------+-----------+----------+--------------+ FV DistalFull                                                        +---------+---------------+---------+-----------+----------+--------------+ PFV      Full                                                        +---------+---------------+---------+-----------+----------+--------------+ POP      Full           Yes      Yes                                 +---------+---------------+---------+-----------+----------+--------------+ PTV      Full                                                        +---------+---------------+---------+-----------+----------+--------------+ PERO     Full                                                        +---------+---------------+---------+-----------+----------+--------------+     Summary: RIGHT: - There is no evidence of deep vein thrombosis in the lower extremity.  - No cystic structure found in the popliteal fossa.  LEFT: - There is no evidence of deep vein thrombosis in the lower extremity.  - No cystic structure  found in the popliteal fossa.  *See table(s) above for measurements and observations. Electronically signed by Coral Else MD on 11/25/2022 at 8:53:27 PM.    Final    DG Shoulder Left  Result Date: 11/25/2022 CLINICAL DATA:  Neuropathic pain.  Left shoulder pain. EXAM: LEFT SHOULDER - 2+ VIEW COMPARISON:  None Available. FINDINGS: There is no evidence of fracture or dislocation. Minor acromioclavicular degenerative change. Glenohumeral joint space is preserved. No erosions. Soft tissues are unremarkable. IMPRESSION: Minor acromioclavicular degenerative change. Electronically Signed   By: Narda Rutherford M.D.   On: 11/25/2022 17:12   CT Angio Chest Pulmonary Embolism (PE) W or WO Contrast  Result Date: 11/25/2022 CLINICAL DATA:  Hypoxia EXAM: CT ANGIOGRAPHY CHEST WITH CONTRAST TECHNIQUE: Multidetector CT imaging of the chest was performed using the standard protocol during bolus administration of intravenous contrast. Multiplanar CT image reconstructions and MIPs were obtained to evaluate the vascular anatomy. RADIATION DOSE REDUCTION: This exam was performed according to the departmental dose-optimization program which includes automated exposure control, adjustment of the mA and/or kV according to patient size  and/or use of iterative reconstruction technique. CONTRAST:  60mL OMNIPAQUE IOHEXOL 350 MG/ML SOLN COMPARISON:  11/05/22, 10/09/2022 FINDINGS: Cardiovascular: Atherosclerotic calcifications of the thoracic aorta are noted. Heart is at the upper limits of normal in size. The pulmonary artery shows a normal branching pattern bilaterally. No filling defect to suggest pulmonary embolism is noted. Mediastinum/Nodes: Thoracic inlet is within normal limits. No hilar or mediastinal adenopathy is noted. The esophagus as visualized is normal limits. Lungs/Pleura: Left lung is well aerated. Soft tissue nodule is noted in the apex measuring up to 9 mm. This is best visualized on image number 43 of series 6. This  is stable from the most recent PET-CT. The right lung demonstrates an upper lobe somewhat bilobed nodular lesion which shows increased activity on recent PET-CT again suspicious for bronchogenic carcinoma. No new focal nodules noted on the right. No confluent infiltrate is seen. Upper Abdomen: No acute abnormality. Musculoskeletal: Degenerative changes of the thoracic spine are noted. No rib abnormality is seen. Review of the MIP images confirms the above findings. IMPRESSION: No evidence of pulmonary emboli. Bilateral upper lobe nodular densities similar to that seen on prior exams. Again the lesion in the right upper lobe is suspicious for underlying carcinoma. Aortic Atherosclerosis (ICD10-I70.0). Electronically Signed   By: Alcide Clever M.D.   On: 11/25/2022 02:28   DG Ankle Complete Right  Result Date: 11/24/2022 CLINICAL DATA:  Trauma to the right lower extremity. EXAM: RIGHT ANKLE - COMPLETE 3+ VIEW; RIGHT TIBIA AND FIBULA - 2 VIEW COMPARISON:  None Available. FINDINGS: There is no acute fracture or dislocation. The bones are osteopenic. No significant arthritic changes. There is diffuse subcutaneous edema. No radiopaque foreign object or soft tissue gas. IMPRESSION: 1. No acute fracture or dislocation. 2. Diffuse subcutaneous edema. Electronically Signed   By: Elgie Collard M.D.   On: 11/24/2022 23:40   DG Tibia/Fibula Right  Result Date: 11/24/2022 CLINICAL DATA:  Trauma to the right lower extremity. EXAM: RIGHT ANKLE - COMPLETE 3+ VIEW; RIGHT TIBIA AND FIBULA - 2 VIEW COMPARISON:  None Available. FINDINGS: There is no acute fracture or dislocation. The bones are osteopenic. No significant arthritic changes. There is diffuse subcutaneous edema. No radiopaque foreign object or soft tissue gas. IMPRESSION: 1. No acute fracture or dislocation. 2. Diffuse subcutaneous edema. Electronically Signed   By: Elgie Collard M.D.   On: 11/24/2022 23:40   DG Foot Complete Right  Result Date:  11/24/2022 CLINICAL DATA:  Right foot pain. EXAM: RIGHT FOOT COMPLETE - 3+ VIEW COMPARISON:  Right foot radiograph dated 10/23/2021 FINDINGS: There is no acute fracture or dislocation. The bones are osteopenic. No significant arthritic changes. There is soft tissue swelling of the dorsal foot. No opaque foreign object or soft tissue gas. IMPRESSION: 1. No acute fracture or dislocation. 2. Soft tissue swelling. Electronically Signed   By: Elgie Collard M.D.   On: 11/24/2022 23:06   VAS US RENAL ARTERY DUPLEX  Result Date: 11/14/2022 ABDOMINAL VISCERAL Patient Name:  Kayla Mcclure  Date of Exam:   11/14/2022 Medical Rec #: [id number]              Accession #:    [id number] Date of Birth: 01-09-1946               Patient Gender: F Patient Age:   25 years Exam Location:  [place] Procedure:      VAS US RENAL ARTERY DUPLEX Referring Phys: [id number] Apolinar Junes CHRISTOPHER CAIN -------------------------------------------------------------------------------- High Risk Factors:  Hypertension, prior MI, coronary artery disease. Vascular Interventions: 01/15/22 B/L renal artery stenting by Dr Randie Heinz. Comparison Study: 02/14/22 prior Performing Technologist: [place] Ponder RVS  Examination Guidelines: A complete evaluation includes B-mode imaging, spectral Doppler, color Doppler, and power Doppler as needed of all accessible portions of each vessel. Bilateral testing is considered an integral part of a complete examination. Limited examinations for reoccurring indications may be performed as noted.  Duplex Findings: +----------------------+--------+--------+------+--------+ Mesenteric            PSV cm/sEDV cm/sPlaqueComments +----------------------+--------+--------+------+--------+ Aorta Prox               77                          +----------------------+--------+--------+------+--------+ Celiac Artery Proximal   97      19                  +----------------------+--------+--------+------+--------+  SMA Proximal            131                          +----------------------+--------+--------+------+--------+    +------------------+--------+--------+-------+ Right Renal ArteryPSV cm/sEDV cm/sComment +------------------+--------+--------+-------+ Origin               78      18           +------------------+--------+--------+-------+ Proximal             77      18           +------------------+--------+--------+-------+ Mid                  70      17           +------------------+--------+--------+-------+ Distal               64      13           +------------------+--------+--------+-------+ +-----------------+--------+--------+-------+ Left Renal ArteryPSV cm/sEDV cm/sComment +-----------------+--------+--------+-------+ Origin              81      18           +-----------------+--------+--------+-------+ Proximal            59      12           +-----------------+--------+--------+-------+ Mid                 62      13           +-----------------+--------+--------+-------+ Distal              53      7            +-----------------+--------+--------+-------+ +------------+--------+--------+----+-----------+--------+--------+----+ Right KidneyPSV cm/sEDV cm/sRI  Left KidneyPSV cm/sEDV cm/sRI   +------------+--------+--------+----+-----------+--------+--------+----+ Upper Pole  38      8       0.79Upper Pole 17      5       0.71 +------------+--------+--------+----+-----------+--------+--------+----+ Mid         35      8       0.        24      7       0.71 +------------+--------+--------+----+-----------+--------+--------+----+ Lower Pole  38      8       0.79Lower Pole 21  6       0.71 +------------+--------+--------+----+-----------+--------+--------+----+ Hilar       61      15      0.75Hilar      54      13      0.76 +------------+--------+--------+----+-----------+--------+--------+----+  +------------------+----+------------------+---+ Right Kidney          Left Kidney           +------------------+----+------------------+---+ RAR                   RAR                   +------------------+----+------------------+---+ RAR (manual)      1.0 RAR (manual)      1.1 +------------------+----+------------------+---+ Cortex                Cortex                +------------------+----+------------------+---+ Cortex thickness      Corex thickness       +------------------+----+------------------+---+ Kidney length (cm)8.98Kidney length (cm)    +------------------+----+------------------+---+  Summary: Renal:  Right: Normal size right kidney. Abnormal right Resistive Index.        Patent stent without obvious evidence of stenosis. Left:  Normal size of left kidney. Abnormal left Resisitve Index.        Patent stent without obvious evidence of stenosis. Mesenteric: Normal Celiac artery and Superior Mesenteric artery findings.  *See table(s) above for measurements and observations.  Diagnosing physician: Lemar Livings MD  Electronically signed by Lemar Livings MD on 11/14/2022 at 10:34:44 AM.    Final      Past medical hx Past Medical History:  Diagnosis Date   Acute GI bleeding 08/07/2019   Initially was in September 2017   Asthma 10/31/2007   CAD (coronary artery disease) 08/05/2007   a) NSTEMI 2009: PCI to the RCA Promus DES 2.5 mm x 23 mm (3.0 mm). b) Myoview June 2017: Nonspecific ST changes. EF greater than 65%. LOW RISK, normal study. No ischemia or infarction.   Carotid artery disease [facility])    s/p Bilateral CEA March & May 2020.    Chronic back pain    Chronic diastolic heart failure (HCC)    CKD (chronic kidney disease), stage III (HCC)    Diverticulosis 04/11/2010   Colonoscopy   Dyslipidemia, goal LDL below 70    Essential hypertension    a. Normal renal arteries by PV angio 05/2013.   GERD (gastroesophageal reflux disease)    History of kidney stones  1975   surgery to remove   History of recent blood transfusion 12/2015   12-19-15, 12-20-15 and 12-21-15   Insomnia    Internal hemorrhoids 04/11/2010   By colonoscopy   Iron deficiency anemia    Lung nodules 05/2021   bilateral   Myocardial infarction [facility]) 2009   Peripheral arterial disease (HCC)    a. s/p PTA and stenting of L CIA stenosis 05/2013.   Stroke [facility])    mini stroke   Tobacco abuse    Quit in 05/2013     Social History   Tobacco Use   Smoking status: Some Days    Current packs/day: 0.00    Types: Cigarettes    Last attempt to quit: 05/04/2013    Years since quitting: 9.5    Passive exposure: Never   Smokeless tobacco: Never   Tobacco comments:    Patient states "I smoke 1-2 about every two to three  days. 10/12/22 Nancee Liter Use   Vaping status: Never Used  Substance Use Topics   Alcohol use: Yes    Comment: occasionally 3 times a year   Drug use: No    Ms.Kostrzewski reports that she has been smoking cigarettes. She has never been exposed to tobacco smoke. She has never used smokeless tobacco. She reports current alcohol use. She reports that she does not use drugs.  Tobacco Cessation: Current every day smoker , 3 per day since biopsy Before  biopsy 5-10 cigarettes a day.   Past surgical hx, Family hx, Social hx all reviewed.  Current Outpatient Medications on File Prior to Visit  Medication Sig   acetaminophen (TYLENOL) 325 MG tablet Take 650 mg by mouth every 6 (six) hours as needed for mild pain or moderate pain.   albuterol (VENTOLIN HFA) 108 (90 Base) MCG/ACT inhaler Inhale 2 puffs into the lungs every 6 (six) hours as needed for wheezing or shortness of breath.   alendronate (FOSAMAX) 70 MG tablet TAKE 1 TABLET BY MOUTH WEEKLY  WITH 8 OZ OF PLAIN WATER 30  MINUTES BEFORE FIRST FOOD, DRINK OR MEDS. STAY UPRIGHT FOR 30  MINS (Patient taking differently: Take 70 mg by mouth once a week.)   allopurinol (ZYLOPRIM) 100 MG tablet Take 100 mg by mouth daily.    aspirin EC 81 MG tablet Take 1 tablet (81 mg total) by mouth daily.   atorvastatin (LIPITOR) 80 MG tablet Take 1 tablet (80 mg total) by mouth daily.   ezetimibe (ZETIA) 10 MG tablet Take 1 tablet (10 mg total) by mouth daily.   ferrous gluconate (FERGON) 324 MG tablet Take 324 mg by mouth daily with breakfast.   gabapentin (NEURONTIN) 300 MG capsule Take 300 mg by mouth 3 (three) times daily.   metoprolol succinate (TOPROL-XL) 50 MG 24 hr tablet Take 1 tablet (50 mg total) by mouth at bedtime.   raloxifene (EVISTA) 60 MG tablet Take 60 mg by mouth daily.   valsartan (DIOVAN) 40 MG tablet Take 40 mg by mouth daily.   No current facility-administered medications on file prior to visit.     Allergies  Allergen Reactions   Diclofenac Sodium     Other Reaction(s): History of GI bleeds   Nsaids     Other Reaction(s): History of GI bleeds   Prednisone     Other Reaction(s): History of GI bleeds    Review Of Systems:  Constitutional:   No  weight loss, night sweats,  Fevers, chills, fatigue, or  lassitude.  HEENT:   No headaches,  Difficulty swallowing,  Tooth/dental problems, or  Sore throat,                No sneezing, itching, ear ache, nasal congestion, post nasal drip,   CV:  No chest pain,  Orthopnea, PND, swelling in lower extremities, anasarca, dizziness, palpitations, syncope.   GI  No heartburn, indigestion, abdominal pain, nausea, vomiting, diarrhea, change in bowel habits, loss of appetite, bloody stools.   Resp: No shortness of breath with exertion or at rest.  No excess mucus, no productive cough,  No non-productive cough,  No coughing up of blood.  No change in color of mucus.  No wheezing.  No chest wall deformity  Skin: no rash or lesions.  GU: no dysuria, change in color of urine, no urgency or frequency.  No flank pain, no hematuria   MS:  No joint pain or swelling.  No decreased range of motion.  No back pain.  Psych:  No change in mood or affect. No depression  or anxiety.  No memory loss.   Vital Signs BP 130/64   Pulse 70   Ht 4\' 11"  (1.499 m)   Wt 117 lb 12.8 oz (53.4 kg)   SpO2 94%   BMI 23.79 kg/m    Physical Exam:  General- No distress,  A&Ox3, pleasant  ENT: No sinus tenderness, TM clear, pale nasal mucosa, no oral exudate,no post nasal drip, no LAN Cardiac: S1, S2, regular rate and rhythm, no murmur Chest: No wheeze/ rales/ dullness; no accessory muscle use, no nasal flaring, no sternal retractions, slightly diminished per bases Abd.: Soft Non-tender, ND, BS +, Body mass index is 23.79 kg/m.  Ext: No clubbing cyanosis, edema Neuro:  normal strength, MAE x 4, A&O x 3, very appropriate Skin: No rashes, warm and dry, no lesions  Psych: normal mood and behavior   Assessment/Plan New diagnosis squamous cell lung cancer RUL  in a current every day smoker  #7 Node negative for malignancy on tissue sampling   Plan Your biopsy was positive for squamous cell lung cancer in the right upper lobe. I have made a referral to radiation oncology for treatment.  You will get a call to get this scheduled.  They will take great care of you.  Good luck with treatment.  Please work on quitting smoking. Try Hypnosis, or call and get free nicotine replacement therapy at the number below. Please contact office for sooner follow up if symptoms do not improve or worsen or seek emergency care   .   You can receive free nicotine replacement therapy ( patches, gum or mints) by calling 1-800-QUIT NOW. Please call so we can get you on the path to becoming  a non-smoker. I know it is hard, but you can do this!  Other options for assistance in smoking cessation ( As covered by your insurance benefits)  Hypnosis for smoking cessation  [employer] [phone]  Acupuncture for smoking cessation  [employer] [phone]     I spent 35 minutes dedicated to the care of this patient on the date of this encounter to include  pre-visit review of records, face-to-face time with the patient discussing conditions above, post visit ordering of testing, clinical documentation with the electronic health record, making appropriate referrals as documented, and communicating necessary information to the patient's healthcare team.    Bevelyn Ngo, NP 12/05/2022  3:08 PM

## 2022-12-06 ENCOUNTER — Ambulatory Visit
Admission: RE | Admit: 2022-12-06 | Discharge: 2022-12-06 | Disposition: A | Discharge status: Home / Self Care | Payer: Medicare Other | Source: Ambulatory Visit | Attending: Radiation Oncology | Admitting: Radiation Oncology

## 2022-12-06 ENCOUNTER — Other Ambulatory Visit: Payer: Self-pay

## 2022-12-06 ENCOUNTER — Encounter: Payer: Self-pay | Admitting: Radiation Oncology

## 2022-12-06 VITALS — BP 152/87 | HR 74 | Temp 98.2°F | Resp 18 | Ht 59.0 in | Wt 118.4 lb

## 2022-12-06 DIAGNOSIS — C3411 Malignant neoplasm of upper lobe, right bronchus or lung: Secondary | ICD-10-CM | POA: Insufficient documentation

## 2022-12-06 DIAGNOSIS — Z8673 Personal history of transient ischemic attack (TIA), and cerebral infarction without residual deficits: Secondary | ICD-10-CM | POA: Diagnosis not present

## 2022-12-06 DIAGNOSIS — M8580 Other specified disorders of bone density and structure, unspecified site: Secondary | ICD-10-CM | POA: Diagnosis not present

## 2022-12-06 DIAGNOSIS — Z7982 Long term (current) use of aspirin: Secondary | ICD-10-CM | POA: Diagnosis not present

## 2022-12-06 DIAGNOSIS — Z87442 Personal history of urinary calculi: Secondary | ICD-10-CM | POA: Diagnosis not present

## 2022-12-06 DIAGNOSIS — G4700 Insomnia, unspecified: Secondary | ICD-10-CM | POA: Diagnosis not present

## 2022-12-06 DIAGNOSIS — M792 Neuralgia and neuritis, unspecified: Secondary | ICD-10-CM | POA: Insufficient documentation

## 2022-12-06 DIAGNOSIS — I130 Hypertensive heart and chronic kidney disease with heart failure and stage 1 through stage 4 chronic kidney disease, or unspecified chronic kidney disease: Secondary | ICD-10-CM | POA: Insufficient documentation

## 2022-12-06 DIAGNOSIS — Z803 Family history of malignant neoplasm of breast: Secondary | ICD-10-CM | POA: Diagnosis not present

## 2022-12-06 DIAGNOSIS — J45909 Unspecified asthma, uncomplicated: Secondary | ICD-10-CM | POA: Insufficient documentation

## 2022-12-06 DIAGNOSIS — I700 Atherosclerosis of aorta: Secondary | ICD-10-CM | POA: Insufficient documentation

## 2022-12-06 DIAGNOSIS — Z79899 Other long term (current) drug therapy: Secondary | ICD-10-CM | POA: Diagnosis not present

## 2022-12-06 DIAGNOSIS — I252 Old myocardial infarction: Secondary | ICD-10-CM | POA: Insufficient documentation

## 2022-12-06 DIAGNOSIS — E785 Hyperlipidemia, unspecified: Secondary | ICD-10-CM | POA: Diagnosis not present

## 2022-12-06 DIAGNOSIS — N183 Chronic kidney disease, stage 3 unspecified: Secondary | ICD-10-CM | POA: Diagnosis not present

## 2022-12-06 DIAGNOSIS — K219 Gastro-esophageal reflux disease without esophagitis: Secondary | ICD-10-CM | POA: Insufficient documentation

## 2022-12-06 DIAGNOSIS — F17210 Nicotine dependence, cigarettes, uncomplicated: Secondary | ICD-10-CM | POA: Diagnosis not present

## 2022-12-06 DIAGNOSIS — I2510 Atherosclerotic heart disease of native coronary artery without angina pectoris: Secondary | ICD-10-CM | POA: Insufficient documentation

## 2022-12-06 DIAGNOSIS — I739 Peripheral vascular disease, unspecified: Secondary | ICD-10-CM | POA: Diagnosis not present

## 2022-12-06 NOTE — Progress Notes (Signed)
Radiation Oncology         [phone] ________________________________  Name: Kayla Mcclure        MRN: [id number]  Date of Service: 12/06/2022 DOB: [birth year]  GM:WNUUVO, Ann Maki, PA  Icard, Rachel Bo, DO     REFERRING PHYSICIAN: Josephine Igo, DO   DIAGNOSIS: The encounter diagnosis was Malignant neoplasm of right upper lobe of lung (HCC).   HISTORY OF PRESENT ILLNESS: Kayla Mcclure is a 77 y.o. female seen at the request of Dr. Tonia Brooms for a newly diagnosed lung cancer.  The patient had been followed by pulmonary medicine since she had abnormal lung cancer screening CT scan in 2023 when she had an L RADS 4B suspicious finding in new nodules within both upper lobes and right lower lobe.  She underwent a PET scan on 05/16/2021 that showed hypermetabolic activity in the left upper lobe with an SUV of 4.5, the nodule measured 1.3 cm.  There was also a 1.3 cm right upper lobe nodule posterior to the bronchus with an SUV of 4.6, and the smaller left upper lobe nodule measuring 5 mm with SUV of 2.6.  She underwent a bronchoscopy on 06/06/2021 which showed no malignant cells in the fine-needle aspirate or brushings of the left upper lobe, and no malignant cells seen in the right lower lobe fine-needle aspirate.  She continued with surveillance imaging with some waxing and waning of multiple nodules in the lungs.  Recently, she had a CT chest on 10/09/2022 that showed a solitary lesion that was very irregular in appearance measuring 2.3 cm and this was macrolobulated with spiculated margins, a PET scan on 11/05/2022 showed the nodule to measure 2.9 cm with an SUV of 7.1 and was new from her scans in March 2023.  There was a right paratracheal lymph node that had an SUV of 3.5 and a right perihilar lymph node with an SUV of 3.4.  No other evidence of metastatic disease was appreciated.  She did have some shortness of breath and was seen for an urgent CTPA on 11/25/2022 that showed similar features in  comparison to her prior images related to her lung nodules and no evidence of pulmonary embolism.  She underwent a bronchoscopy on 11/27/2022, this showed no malignant cells in the station 7 lymph node, though fine-needle aspirate in the right upper lobe showed squamous cell carcinoma.  She is not felt to be a good candidate for surgical intervention and is seen today to discuss stereotactic body radiotherapy (SBRT) to the right upper lobe nodule.    PREVIOUS RADIATION THERAPY: No   PAST MEDICAL HISTORY:  Past Medical History:  Diagnosis Date   Acute GI bleeding 08/07/2019   Initially was in September 2017   Asthma 10/31/2007   CAD (coronary artery disease) 08/05/2007   a) NSTEMI 2009: PCI to the RCA Promus DES 2.5 mm x 23 mm (3.0 mm). b) Myoview June 2017: Nonspecific ST changes. EF greater than 65%. LOW RISK, normal study. No ischemia or infarction.   Carotid artery disease [facility])    s/p Bilateral CEA March & May 2020.    Chronic back pain    Chronic diastolic heart failure (HCC)    CKD (chronic kidney disease), stage III (HCC)    Diverticulosis 04/11/2010   Colonoscopy   Dyslipidemia, goal LDL below 70    Essential hypertension    a. Normal renal arteries by PV angio 05/2013.   GERD (gastroesophageal reflux disease)    History of  kidney stones 1975   surgery to remove   History of recent blood transfusion 12/2015   12-19-15, 12-20-15 and 12-21-15   Insomnia    Internal hemorrhoids 04/11/2010   By colonoscopy   Iron deficiency anemia    Lung nodules 05/2021   bilateral   Myocardial infarction [facility]) 2009   Peripheral arterial disease (HCC)    a. s/p PTA and stenting of L CIA stenosis 05/2013.   Stroke [facility]    mini stroke   Tobacco abuse    Quit in 05/2013       PAST SURGICAL HISTORY: Past Surgical History:  Procedure Laterality Date   BRONCHIAL BIOPSY  06/06/2021   Procedure: BRONCHIAL BIOPSIES;  Surgeon: Josephine Igo, DO;  Location: [facility] ENDOSCOPY;  Service: Pulmonary;;    BRONCHIAL BIOPSY  11/27/2022   Procedure: BRONCHIAL BIOPSIES;  Surgeon: Josephine Igo, DO;  Location: [facility]OS[facility];  Service: Pulmonary;;   BRONCHIAL BRUSHINGS  06/06/2021   Procedure: BRONCHIAL BRUSHINGS;  Surgeon: Josephine Igo, DO;  Location: [facility];  Service: Pulmonary;;   BRONCHIAL NEEDLE ASPIRATION BIOPSY  06/06/2021   Procedure: BRONCHIAL NEEDLE ASPIRATION BIOPSIES;  Surgeon: Josephine Igo, DO;  Location: [facility];  Service: Pulmonary;;   BRONCHIAL NEEDLE ASPIRATION BIOPSY  11/27/2022   Procedure: BRONCHIAL NEEDLE ASPIRATION BIOPSIES;  Surgeon: Josephine Igo, DO;  Location: [facility];  Service: Pulmonary;;   CAPSULE ENDOSCOPY  10/27/2019   [facility]) evidence of "pinhole leaking" in both stomach and proximal small bowel; no overt bleeding noted   CHOLECYSTECTOMY  1975   COLONOSCOPY  04/11/2010   COLONOSCOPY  11/30/2015   CORONARY ANGIOPLASTY WITH STENT PLACEMENT  08/05/2007   Promus DES 2.5 mm x 23 mm (3 mm)  to RCA; EF 60-70%   ENDARTERECTOMY Left 07/28/2019   Procedure: ENDARTERECTOMY CAROTID;  Surgeon: Maeola Harman, MD;  Location[facility];  Service: Vascular;  Laterality: Left;   ENDARTERECTOMY Right 08/27/2019   Procedure: ENDARTERECTOMY CAROTID RIGHT WITH PATCH ANGIOPLASTY;  Surgeon: Maeola Harman, MD;  Location: [facility] OR;  Service: Vascular;  Laterality: Right;   ENDOBRONCHIAL ULTRASOUND Bilateral 11/27/2022   Procedure: ENDOBRONCHIAL ULTRASOUND;  Surgeon: Josephine Igo, DO;  Location: [facility]OS[facility];  Service: Pulmonary;  Laterality: Bilateral;   ENTEROSCOPY N/A 12/27/2015   Procedure: ENTEROSCOPY;  Surgeon: Vida Rigger, MD;  Location: [facility] ENDOSCOPY;  Service: Endoscopy;  Laterality: N/A;  also needs slim egd scope   ENTEROSCOPY N/A 05/15/2019   Procedure: ENTEROSCOPY;  Surgeon: Bernette Redbird, MD;  Location: [facility] ENDOSCOPY;  Service: Endoscopy;  Laterality: N/A;  Push enteroscopy using either ultraslim colonoscope or push enteroscope    ENTEROSCOPY N/A 08/08/2019   Procedure: ENTEROSCOPY;  Surgeon: Kerin Salen, MD;  Location: [facility] ENDOSCOPY;  Service: Gastroenterology;  Laterality: N/A;   ENTEROSCOPY N/A 03/22/2022   Procedure: ENTEROSCOPY;  Surgeon: Willis Modena, MD;  Location: [facility] ENDOSCOPY;  Service: Gastroenterology;  Laterality: N/A;   ESOPHAGOGASTRODUODENOSCOPY N/A 09/13/2013   Procedure: ESOPHAGOGASTRODUODENOSCOPY (EGD);  Surgeon: Shirley Friar, MD;  Location: [facility] ENDOSCOPY;  Service: Endoscopy;  Laterality: N/A;   ESOPHAGOGASTRODUODENOSCOPY N/A 07/14/2015   Procedure: ESOPHAGOGASTRODUODENOSCOPY (EGD);  Surgeon: Vida Rigger, MD;  Location: [facility] ENDOSCOPY;  Service: Endoscopy;  Laterality: N/A;   ESOPHAGOGASTRODUODENOSCOPY (EGD) WITH PROPOFOL N/A 09/09/2020   Procedure: ESOPHAGOGASTRODUODENOSCOPY (EGD) WITH PROPOFOL;  Surgeon: Vida Rigger, MD;  Location: [facility] ENDOSCOPY;  Service: Endoscopy;  Laterality: N/A;   FIDUCIAL MARKER PLACEMENT  06/06/2021   Procedure: FIDUCIAL MARKER PLACEMENT;  Surgeon: Josephine Igo, DO;  Location:  [facility]OS[facility];  Service: Pulmonary;;   FIDUCIAL MARKER PLACEMENT  11/27/2022   Procedure: FIDUCIAL MARKER PLACEMENT;  Surgeon: Josephine Igo, DO;  Location: [facility] ENDOS[facility];  Service: Pulmonary;;   gi bleed  07/2015   blood given   GIVENS CAPSULE STUDY N/A 03/20/2022   Procedure: GIVENS CAPSULE STUDY;  Surgeon: Willis Modena, MD;  Location: [facility] ENDOSCOPY;  Service: Gastroenterology;  Laterality: N/A;   HOT HEMOSTASIS N/A 12/27/2015   Procedure: HOT HEMOSTASIS (ARGON PLASMA COAGULATION/BICAP);  Surgeon: Vida Rigger, MD;  Location: [facility] ENDOSCOPY;  Service: Endoscopy;  Laterality: N/A;   HOT HEMOSTASIS N/A 08/08/2019   Procedure: HOT HEMOSTASIS (ARGON PLASMA COAGULATION/BICAP);  Surgeon: Kerin Salen, MD;  Location: [facility] ENDOSCOPY;  Service: Gastroenterology;  Laterality: N/A;   HOT HEMOSTASIS N/A 09/09/2020   Procedure: HOT HEMOSTASIS (ARGON PLASMA COAGULATION/BICAP);  Surgeon: Vida Rigger, MD;  Location: [facility]  ENDOSCOPY;  Service: Endoscopy;  Laterality: N/A;   ILIAC ARTERY STENT Left 05/04/2013   L CIA-EIA -- Dr. Allyson Sabal   LEFT HEART CATH AND CORONARY ANGIOGRAPHY N/A 02/11/2019   Procedure: LEFT HEART CATH AND CORONARY ANGIOGRAPHY;  Surgeon: Marykay Lex, MD;  Location: [facility] INVASIVE CV LAB;;-  Patent RCA stent with minimal ISR after pRCA 50%).  Small caliber RPAV with ~75%.  pLAD 30%, m-dLAD 40%, D2 40%.  Normal LVEDP and LVEF.   LOWER EXTREMITY ANGIOGRAM Bilateral 05/04/2013   Procedure: LOWER EXTREMITY ANGIOGRAM;  Surgeon: Runell Gess, MD;  Location: [facility] CATH LAB;  Service: Cardiovascular;  Laterality: Bilateral;   NM MYOVIEW LTD  09/07/2015   EF > 65%. Nonspecific ST changes but no ischemic changes. No ischemia or infarction. LOW RISK   PATCH ANGIOPLASTY Left 07/28/2019   Procedure: PATCH ANGIOPLASTY USING Livia Snellen BIOLOGIC PATCH;  Surgeon: Maeola Harman, MD;  Location: [facility] OR;  Service: Vascular;  Laterality: Left;   RENAL ANGIOGRAPHY N/A 01/15/2022   Procedure: RENAL ANGIOGRAPHY;  Surgeon: Maeola Harman, MD;  Location: [facility] INVASIVE CV LAB;  Service: Cardiovascular;  Laterality: N/A;   TONSILLECTOMY  1960's   TRANSTHORACIC ECHOCARDIOGRAM  05/14/2019   Normal EF 60 to 65%.  No R WMA.   Normal RV but mildly elevated pressures.  Relatively normal aortic and mitral valves.   UPPER GI ENDOSCOPY  12/14/2015   VIDEO BRONCHOSCOPY WITH RADIAL ENDOBRONCHIAL ULTRASOUND  06/06/2021   Procedure: RADIAL ENDOBRONCHIAL ULTRASOUND;  Surgeon: Josephine Igo, DO;  Location: [facility] ENDOSCOPY;  Service: Pulmonary;;     FAMILY HISTORY:  Family History  Problem Relation Age of Onset   Diabetes Mother    Heart disease Mother    Heart attack Mother 81   Breast cancer Mother 42   Colon cancer Mother 59   Heart attack Father 19   Pneumonia Father    Diabetes Sister    Breast cancer Sister    Breast cancer Sister 31     SOCIAL HISTORY:  reports that she has been smoking cigarettes. She has  a 20 pack-year smoking history. She has never been exposed to tobacco smoke. She has never used smokeless tobacco. She reports current alcohol use. She reports that she does not use drugs. The patient is married and accompanied by her son and granddaughter.    ALLERGIES: Diclofenac sodium, Nsaids, and Prednisone   MEDICATIONS:  Current Outpatient Medications  Medication Sig Dispense Refill   acetaminophen (TYLENOL) 325 MG tablet Take 650 mg by mouth every 6 (six) hours as needed for mild pain or moderate pain.     albuterol (  VENTOLIN HFA) 108 (90 Base) MCG/ACT inhaler Inhale 2 puffs into the lungs every 6 (six) hours as needed for wheezing or shortness of breath.     alendronate (FOSAMAX) 70 MG tablet TAKE 1 TABLET BY MOUTH WEEKLY  WITH 8 OZ OF PLAIN WATER 30  MINUTES BEFORE FIRST FOOD, DRINK OR MEDS. STAY UPRIGHT FOR 30  MINS (Patient taking differently: Take 70 mg by mouth once a week.) 8 tablet 5   allopurinol (ZYLOPRIM) 100 MG tablet Take 100 mg by mouth daily.     aspirin EC 81 MG tablet Take 1 tablet (81 mg total) by mouth daily. 30 tablet    atorvastatin (LIPITOR) 80 MG tablet Take 1 tablet (80 mg total) by mouth daily. 90 tablet 3   ezetimibe (ZETIA) 10 MG tablet Take 1 tablet (10 mg total) by mouth daily. 90 tablet 3   ferrous gluconate (FERGON) 324 MG tablet Take 324 mg by mouth daily with breakfast.     gabapentin (NEURONTIN) 300 MG capsule Take 300 mg by mouth 3 (three) times daily.     metoprolol succinate (TOPROL-XL) 50 MG 24 hr tablet Take 1 tablet (50 mg total) by mouth at bedtime. 90 tablet 3   raloxifene (EVISTA) 60 MG tablet Take 60 mg by mouth daily.     valsartan (DIOVAN) 40 MG tablet Take 40 mg by mouth daily.     No current facility-administered medications for this encounter.     REVIEW OF SYSTEMS: On review of systems, the patient reports that she is doing okay. She has occasional cough that is non productive, less than 5 pounds of unintended weight loss, and no  shortness of breath or chest pain. No other complaints are verbalized.     PHYSICAL EXAM:  Wt Readings from Last 3 Encounters:  12/06/22 118 lb 6.4 oz (53.7 kg)  12/05/22 117 lb 12.8 oz (53.4 kg)  11/27/22 122 lb (55.3 kg)   Temp Readings from Last 3 Encounters:  12/06/22 98.2 F (36.8 C)  11/27/22 (!) 97.2 F (36.2 C) (Temporal)  11/26/22 98.4 F (36.9 C) (Oral)   BP Readings from Last 3 Encounters:  12/06/22 (!) 152/87  12/05/22 130/64  11/27/22 (!) 121/57   Pulse Readings from Last 3 Encounters:  12/06/22 74  12/05/22 70  11/27/22 79   Pain Assessment Pain Score: 0-No pain/10  In general this is a well appearing African American female in no acute distress. She's alert and oriented x4 and appropriate throughout the examination. Cardiopulmonary assessment is negative for acute distress and she exhibits normal effort.     ECOG = 1  0 - Asymptomatic (Fully active, able to carry on all predisease activities without restriction)  1 - Symptomatic but completely ambulatory (Restricted in physically strenuous activity but ambulatory and able to carry out work of a light or sedentary nature. For example, light housework, office work)  2 - Symptomatic, <50% in bed during the day (Ambulatory and capable of all self care but unable to carry out any work activities. Up and about more than 50% of waking hours)  3 - Symptomatic, >50% in bed, but not bedbound (Capable of only limited self-care, confined to bed or chair 50% or more of waking hours)  4 - Bedbound (Completely disabled. Cannot carry on any self-care. Totally confined to bed or chair)  5 - Death   Santiago Glad MM, Creech RH, Tormey DC, et al. [phone]). "Toxicity and response criteria of the [facility]". Am. Evlyn Clines. Oncol.  5 (6): 649-55    LABORATORY DATA:  Lab Results  Component Value Date   WBC 6.8 11/26/2022   HGB 12.2 11/26/2022   HCT 40.0 11/26/2022   MCV 91.3 11/26/2022   PLT 177  11/26/2022   Lab Results  Component Value Date   NA 135 11/26/2022   K 4.7 11/26/2022   CL 105 11/26/2022   CO2 25 11/26/2022   Lab Results  Component Value Date   ALT 10 11/26/2022   AST 16 11/26/2022   ALKPHOS 43 11/26/2022   BILITOT 0.9 11/26/2022      RADIOGRAPHY: DG Chest Port 1 View  Result Date: 11/27/2022 CLINICAL DATA:  Post bronchoscopy. EXAM: PORTABLE CHEST 1 VIEW COMPARISON:  03/19/2022 FINDINGS: Heart size and pulmonary vascularity are normal. Peribronchial thickening with increased interstitial changes likely representing chronic bronchitis. No airspace disease or consolidation. No pleural effusions. No pneumothorax. Mediastinal contours appear intact. Calcification of the aorta. IMPRESSION: Bronchitic changes in the lungs.  No focal consolidation. Electronically Signed   By: Burman Nieves M.D.   On: 11/27/2022 15:16   DG C-ARM BRONCHOSCOPY  Result Date: 11/27/2022 C-ARM BRONCHOSCOPY: Fluoroscopy was utilized by the requesting physician.  No radiographic interpretation.   VAS [place] LOWER EXTREMITY VENOUS (DVT)  Result Date: 11/25/2022  Lower Venous DVT Study Patient Name:  Kayla Mcclure  Date of Exam:   11/25/2022 Medical Rec #: [id number]              Accession #:    [id number] Date of Birth: February 01, 1946               Patient Gender: F Patient Age:   43 years Exam Location:  [facility] Procedure:      VAS [place] LOWER EXTREMITY VENOUS (DVT) Referring Phys: DAVID ORTIZ --------------------------------------------------------------------------------  Indications: Swelling.  Risk Factors: None identified. Comparison Study: No prior studies. Performing Technologist: Chanda Busing RVT  Examination Guidelines: A complete evaluation includes B-mode imaging, spectral Doppler, color Doppler, and power Doppler as needed of all accessible portions of each vessel. Bilateral testing is considered an integral part of a complete examination. Limited examinations for reoccurring  indications may be performed as noted. The reflux portion of the exam is performed with the patient in reverse Trendelenburg.  +---------+---------------+---------+-----------+----------+--------------+ RIGHT    CompressibilityPhasicitySpontaneityPropertiesThrombus Aging +---------+---------------+---------+-----------+----------+--------------+ CFV      Full           Yes      Yes                                 +---------+---------------+---------+-----------+----------+--------------+ SFJ      Full                                                        +---------+---------------+---------+-----------+----------+--------------+ FV Prox  Full                                                        +---------+---------------+---------+-----------+----------+--------------+ FV Mid   Full                                                        +---------+---------------+---------+-----------+----------+--------------+  FV DistalFull                                                        +---------+---------------+---------+-----------+----------+--------------+ PFV      Full                                                        +---------+---------------+---------+-----------+----------+--------------+ POP      Full           Yes      Yes                                 +---------+---------------+---------+-----------+----------+--------------+ PTV      Full                                                        +---------+---------------+---------+-----------+----------+--------------+ PERO     Full                                                        +---------+---------------+---------+-----------+----------+--------------+   +---------+---------------+---------+-----------+----------+--------------+ LEFT     CompressibilityPhasicitySpontaneityPropertiesThrombus Aging  +---------+---------------+---------+-----------+----------+--------------+ CFV      Full           Yes      Yes                                 +---------+---------------+---------+-----------+----------+--------------+ SFJ      Full                                                        +---------+---------------+---------+-----------+----------+--------------+ FV Prox  Full                                                        +---------+---------------+---------+-----------+----------+--------------+ FV Mid   Full                                                        +---------+---------------+---------+-----------+----------+--------------+ FV DistalFull                                                        +---------+---------------+---------+-----------+----------+--------------+  PFV      Full                                                        +---------+---------------+---------+-----------+----------+--------------+ POP      Full           Yes      Yes                                 +---------+---------------+---------+-----------+----------+--------------+ PTV      Full                                                        +---------+---------------+---------+-----------+----------+--------------+ PERO     Full                                                        +---------+---------------+---------+-----------+----------+--------------+     Summary: RIGHT: - There is no evidence of deep vein thrombosis in the lower extremity.  - No cystic structure found in the popliteal fossa.  LEFT: - There is no evidence of deep vein thrombosis in the lower extremity.  - No cystic structure found in the popliteal fossa.  *See table(s) above for measurements and observations. Electronically signed by Coral Else MD on 11/25/2022 at 8:53:27 PM.    Final    DG Shoulder Left  Result Date: 11/25/2022 CLINICAL DATA:  Neuropathic pain.  Left  shoulder pain. EXAM: LEFT SHOULDER - 2+ VIEW COMPARISON:  None Available. FINDINGS: There is no evidence of fracture or dislocation. Minor acromioclavicular degenerative change. Glenohumeral joint space is preserved. No erosions. Soft tissues are unremarkable. IMPRESSION: Minor acromioclavicular degenerative change. Electronically Signed   By: Narda Rutherford M.D.   On: 11/25/2022 17:12   CT Angio Chest Pulmonary Embolism (PE) W or WO Contrast  Result Date: 11/25/2022 CLINICAL DATA:  Hypoxia EXAM: CT ANGIOGRAPHY CHEST WITH CONTRAST TECHNIQUE: Multidetector CT imaging of the chest was performed using the standard protocol during bolus administration of intravenous contrast. Multiplanar CT image reconstructions and MIPs were obtained to evaluate the vascular anatomy. RADIATION DOSE REDUCTION: This exam was performed according to the departmental dose-optimization program which includes automated exposure control, adjustment of the mA and/or kV according to patient size and/or use of iterative reconstruction technique. CONTRAST:  60mL OMNIPAQUE IOHEXOL 350 MG/ML SOLN COMPARISON:  11/05/22, 10/09/2022 FINDINGS: Cardiovascular: Atherosclerotic calcifications of the thoracic aorta are noted. Heart is at the upper limits of normal in size. The pulmonary artery shows a normal branching pattern bilaterally. No filling defect to suggest pulmonary embolism is noted. Mediastinum/Nodes: Thoracic inlet is within normal limits. No hilar or mediastinal adenopathy is noted. The esophagus as visualized is normal limits. Lungs/Pleura: Left lung is well aerated. Soft tissue nodule is noted in the apex measuring up to 9 mm. This is best visualized on image number 43 of series 6. This is stable from the most recent PET-CT. The right lung demonstrates  an upper lobe somewhat bilobed nodular lesion which shows increased activity on recent PET-CT again suspicious for bronchogenic carcinoma. No new focal nodules noted on the right. No  confluent infiltrate is seen. Upper Abdomen: No acute abnormality. Musculoskeletal: Degenerative changes of the thoracic spine are noted. No rib abnormality is seen. Review of the MIP images confirms the above findings. IMPRESSION: No evidence of pulmonary emboli. Bilateral upper lobe nodular densities similar to that seen on prior exams. Again the lesion in the right upper lobe is suspicious for underlying carcinoma. Aortic Atherosclerosis (ICD10-I70.0). Electronically Signed   By: Alcide Clever M.D.   On: 11/25/2022 02:28   DG Ankle Complete Right  Result Date: 11/24/2022 CLINICAL DATA:  Trauma to the right lower extremity. EXAM: RIGHT ANKLE - COMPLETE 3+ VIEW; RIGHT TIBIA AND FIBULA - 2 VIEW COMPARISON:  None Available. FINDINGS: There is no acute fracture or dislocation. The bones are osteopenic. No significant arthritic changes. There is diffuse subcutaneous edema. No radiopaque foreign object or soft tissue gas. IMPRESSION: 1. No acute fracture or dislocation. 2. Diffuse subcutaneous edema. Electronically Signed   By: Elgie Collard M.D.   On: 11/24/2022 23:40   DG Tibia/Fibula Right  Result Date: 11/24/2022 CLINICAL DATA:  Trauma to the right lower extremity. EXAM: RIGHT ANKLE - COMPLETE 3+ VIEW; RIGHT TIBIA AND FIBULA - 2 VIEW COMPARISON:  None Available. FINDINGS: There is no acute fracture or dislocation. The bones are osteopenic. No significant arthritic changes. There is diffuse subcutaneous edema. No radiopaque foreign object or soft tissue gas. IMPRESSION: 1. No acute fracture or dislocation. 2. Diffuse subcutaneous edema. Electronically Signed   By: Elgie Collard M.D.   On: 11/24/2022 23:40   DG Foot Complete Right  Result Date: 11/24/2022 CLINICAL DATA:  Right foot pain. EXAM: RIGHT FOOT COMPLETE - 3+ VIEW COMPARISON:  Right foot radiograph dated 10/23/2021 FINDINGS: There is no acute fracture or dislocation. The bones are osteopenic. No significant arthritic changes. There is soft  tissue swelling of the dorsal foot. No opaque foreign object or soft tissue gas. IMPRESSION: 1. No acute fracture or dislocation. 2. Soft tissue swelling. Electronically Signed   By: Elgie Collard M.D.   On: 11/24/2022 23:06   VAS US RENAL ARTERY DUPLEX  Result Date: 11/14/2022 ABDOMINAL VISCERAL Patient Name:  Kayla Mcclure  Date of Exam:   11/14/2022 Medical Rec #: [id number]              Accession #:    [id number] Date of Birth: 1945/09/24               Patient Gender: F Patient Age:   59 years Exam Location:  [place] Procedure:      VAS US RENAL ARTERY DUPLEX Referring Phys: [id number] Apolinar Junes CHRISTOPHER CAIN -------------------------------------------------------------------------------- High Risk Factors: Hypertension, prior MI, coronary artery disease. Vascular Interventions: 01/15/22 B/L renal artery stenting by Dr Randie Heinz. Comparison Study: 02/14/22 prior Performing Technologist: [place] Ponder RVS  Examination Guidelines: A complete evaluation includes B-mode imaging, spectral Doppler, color Doppler, and power Doppler as needed of all accessible portions of each vessel. Bilateral testing is considered an integral part of a complete examination. Limited examinations for reoccurring indications may be performed as noted.  Duplex Findings: +----------------------+--------+--------+------+--------+ Mesenteric            PSV cm/sEDV cm/sPlaqueComments +----------------------+--------+--------+------+--------+ Aorta Prox               77                          +----------------------+--------+--------+------+--------+  Celiac Artery Proximal   97      19                  +----------------------+--------+--------+------+--------+ SMA Proximal            131                          +----------------------+--------+--------+------+--------+    +------------------+--------+--------+-------+ Right Renal ArteryPSV cm/sEDV cm/sComment  +------------------+--------+--------+-------+ Origin               78      18           +------------------+--------+--------+-------+ Proximal             77      18           +------------------+--------+--------+-------+ Mid                  70      17           +------------------+--------+--------+-------+ Distal               64      13           +------------------+--------+--------+-------+ +-----------------+--------+--------+-------+ Left Renal ArteryPSV cm/sEDV cm/sComment +-----------------+--------+--------+-------+ Origin              81      18           +-----------------+--------+--------+-------+ Proximal            59      12           +-----------------+--------+--------+-------+ Mid                 62      13           +-----------------+--------+--------+-------+ Distal              53      7            +-----------------+--------+--------+-------+ +------------+--------+--------+----+-----------+--------+--------+----+ Right KidneyPSV cm/sEDV cm/sRI  Left KidneyPSV cm/sEDV cm/sRI   +------------+--------+--------+----+-----------+--------+--------+----+ Upper Pole  38      8       0.79Upper Pole 17      5       0.71 +------------+--------+--------+----+-----------+--------+--------+----+ Mid         35      8       0.        24      7       0.71 +------------+--------+--------+----+-----------+--------+--------+----+ Lower Pole  38      8       0.79Lower Pole 21      6       0.71 +------------+--------+--------+----+-----------+--------+--------+----+ Hilar       61      15      0.75Hilar      54      13      0.76 +------------+--------+--------+----+-----------+--------+--------+----+ +------------------+----+------------------+---+ Right Kidney          Left Kidney           +------------------+----+------------------+---+ RAR                   RAR                    +------------------+----+------------------+---+ RAR (manual)      1.0 RAR (manual)      1.1 +------------------+----+------------------+---+ Cortex  Cortex                +------------------+----+------------------+---+ Cortex thickness      Corex thickness       +------------------+----+------------------+---+ Kidney length (cm)8.98Kidney length (cm)    +------------------+----+------------------+---+  Summary: Renal:  Right: Normal size right kidney. Abnormal right Resistive Index.        Patent stent without obvious evidence of stenosis. Left:  Normal size of left kidney. Abnormal left Resisitve Index.        Patent stent without obvious evidence of stenosis. Mesenteric: Normal Celiac artery and Superior Mesenteric artery findings.  *See table(s) above for measurements and observations.  Diagnosing physician: Lemar Livings MD  Electronically signed by Lemar Livings MD on 11/14/2022 at 10:34:44 AM.    Final        IMPRESSION/PLAN: 1. Stage IA3, cT1cN0M0, NSCLC, Squamous Cell Carcinoma of the RUL. Dr. Mitzi Hansen discusses the pathology findings and reviews the nature of early stage lung cancer. Dr. Mitzi Hansen reviews that the standard of care is for surgical resection. However for patients who are not medical candidates to undergo surgery, or who choose to forgo surgery, stereotactic body radiotherapy (SBRT) is an appropriate alternative. We discussed the risks, benefits, short, and long term effects of radiotherapy, as well as the curative intent, and the patient is interested in proceeding. Dr. Mitzi Hansen discusses the delivery and logistics of radiotherapy and anticipates a course of 3-5 fractions of radiotherapy. Written consent is obtained and placed in the chart, a copy was provided to the patient. The patient will be contacted to coordinate treatment planning by our simulation department.  2. Hypermetabolic activity in the mediastinal nodes, negative on cytology. These will be followed  closely as she completes treatment for #1.   In a visit lasting 60 minutes, greater than 50% of the time was spent face to face discussing the patient's condition, in preparation for the discussion, and coordinating the patient's care.   The above documentation reflects my direct findings during this shared patient visit. Please see the separate note by Dr. Mitzi Hansen on this date for the remainder of the patient's plan of care.    Osker Mason, [facility]   **Disclaimer: This note was dictated with voice recognition software. Similar sounding words can inadvertently be transcribed and this note may contain transcription errors which may not have been corrected upon publication of note.**

## 2022-12-06 NOTE — Progress Notes (Signed)
Reviewed at last OV  Thanks,  BLI  Josephine Igo, DO [facility] 12/06/2022 8:35 AM

## 2022-12-07 DIAGNOSIS — C3411 Malignant neoplasm of upper lobe, right bronchus or lung: Secondary | ICD-10-CM | POA: Insufficient documentation

## 2022-12-07 DIAGNOSIS — F17210 Nicotine dependence, cigarettes, uncomplicated: Secondary | ICD-10-CM | POA: Diagnosis not present

## 2022-12-07 NOTE — Progress Notes (Signed)
The proposed treatment discussed in conference is for discussion purpose only and is not a binding recommendation.  The patients have not been physically examined, or presented with their treatment options.  Therefore, final treatment plans cannot be decided.  

## 2022-12-11 DIAGNOSIS — I129 Hypertensive chronic kidney disease with stage 1 through stage 4 chronic kidney disease, or unspecified chronic kidney disease: Secondary | ICD-10-CM | POA: Diagnosis not present

## 2022-12-11 DIAGNOSIS — C3491 Malignant neoplasm of unspecified part of right bronchus or lung: Secondary | ICD-10-CM | POA: Diagnosis not present

## 2022-12-11 DIAGNOSIS — J9601 Acute respiratory failure with hypoxia: Secondary | ICD-10-CM | POA: Diagnosis not present

## 2022-12-11 DIAGNOSIS — M79671 Pain in right foot: Secondary | ICD-10-CM | POA: Diagnosis not present

## 2022-12-11 DIAGNOSIS — N1831 Chronic kidney disease, stage 3a: Secondary | ICD-10-CM | POA: Diagnosis not present

## 2022-12-14 ENCOUNTER — Ambulatory Visit
Admission: RE | Admit: 2022-12-14 | Discharge: 2022-12-14 | Disposition: A | Discharge status: Home / Self Care | Payer: Medicare Other | Source: Ambulatory Visit | Attending: Radiation Oncology | Admitting: Radiation Oncology

## 2022-12-14 DIAGNOSIS — Z510 Encounter for antineoplastic radiation therapy: Secondary | ICD-10-CM | POA: Insufficient documentation

## 2022-12-14 DIAGNOSIS — Z87891 Personal history of nicotine dependence: Secondary | ICD-10-CM | POA: Diagnosis not present

## 2022-12-14 DIAGNOSIS — F17210 Nicotine dependence, cigarettes, uncomplicated: Secondary | ICD-10-CM | POA: Diagnosis not present

## 2022-12-14 DIAGNOSIS — C3411 Malignant neoplasm of upper lobe, right bronchus or lung: Secondary | ICD-10-CM | POA: Insufficient documentation

## 2022-12-21 DIAGNOSIS — C3411 Malignant neoplasm of upper lobe, right bronchus or lung: Secondary | ICD-10-CM | POA: Diagnosis not present

## 2022-12-21 DIAGNOSIS — Z87891 Personal history of nicotine dependence: Secondary | ICD-10-CM | POA: Diagnosis not present

## 2022-12-21 DIAGNOSIS — Z510 Encounter for antineoplastic radiation therapy: Secondary | ICD-10-CM | POA: Diagnosis not present

## 2022-12-21 DIAGNOSIS — F17210 Nicotine dependence, cigarettes, uncomplicated: Secondary | ICD-10-CM | POA: Diagnosis not present

## 2022-12-26 ENCOUNTER — Other Ambulatory Visit: Payer: Self-pay

## 2022-12-26 ENCOUNTER — Ambulatory Visit
Admission: RE | Admit: 2022-12-26 | Discharge: 2022-12-26 | Disposition: A | Discharge status: Home / Self Care | Payer: Medicare Other | Source: Ambulatory Visit | Attending: Radiation Oncology | Admitting: Radiation Oncology

## 2022-12-26 DIAGNOSIS — C3411 Malignant neoplasm of upper lobe, right bronchus or lung: Secondary | ICD-10-CM | POA: Diagnosis not present

## 2022-12-26 DIAGNOSIS — Z510 Encounter for antineoplastic radiation therapy: Secondary | ICD-10-CM | POA: Diagnosis not present

## 2022-12-26 DIAGNOSIS — Z87891 Personal history of nicotine dependence: Secondary | ICD-10-CM | POA: Diagnosis not present

## 2022-12-26 LAB — RAD ONC ARIA SESSION SUMMARY
Course Elapsed Days: 0
Plan Fractions Treated to Date: 1
Plan Prescribed Dose Per Fraction: 12 Gy
Plan Total Fractions Prescribed: 5
Plan Total Prescribed Dose: 60 Gy
Reference Point Dosage Given to Date: 12 Gy
Reference Point Session Dosage Given: 12 Gy
Session Number: 1

## 2022-12-27 ENCOUNTER — Ambulatory Visit: Payer: Medicare Other

## 2022-12-28 ENCOUNTER — Ambulatory Visit
Admission: RE | Admit: 2022-12-28 | Discharge: 2022-12-28 | Disposition: A | Discharge status: Home / Self Care | Payer: Medicare Other | Source: Ambulatory Visit | Attending: Radiation Oncology | Admitting: Radiation Oncology

## 2022-12-28 ENCOUNTER — Other Ambulatory Visit: Payer: Self-pay

## 2022-12-28 DIAGNOSIS — C3411 Malignant neoplasm of upper lobe, right bronchus or lung: Secondary | ICD-10-CM | POA: Diagnosis not present

## 2022-12-28 DIAGNOSIS — Z87891 Personal history of nicotine dependence: Secondary | ICD-10-CM | POA: Diagnosis not present

## 2022-12-28 DIAGNOSIS — Z510 Encounter for antineoplastic radiation therapy: Secondary | ICD-10-CM | POA: Diagnosis not present

## 2022-12-28 LAB — RAD ONC ARIA SESSION SUMMARY
Course Elapsed Days: 2
Plan Fractions Treated to Date: 2
Plan Prescribed Dose Per Fraction: 12 Gy
Plan Total Fractions Prescribed: 5
Plan Total Prescribed Dose: 60 Gy
Reference Point Dosage Given to Date: 24 Gy
Reference Point Session Dosage Given: 12 Gy
Session Number: 2

## 2022-12-31 ENCOUNTER — Other Ambulatory Visit: Payer: Self-pay

## 2022-12-31 ENCOUNTER — Ambulatory Visit
Admission: RE | Admit: 2022-12-31 | Discharge: 2022-12-31 | Disposition: A | Discharge status: Home / Self Care | Payer: Medicare Other | Source: Ambulatory Visit | Attending: Radiation Oncology | Admitting: Radiation Oncology

## 2022-12-31 DIAGNOSIS — Z87891 Personal history of nicotine dependence: Secondary | ICD-10-CM | POA: Diagnosis not present

## 2022-12-31 DIAGNOSIS — Z510 Encounter for antineoplastic radiation therapy: Secondary | ICD-10-CM | POA: Diagnosis not present

## 2022-12-31 DIAGNOSIS — C3411 Malignant neoplasm of upper lobe, right bronchus or lung: Secondary | ICD-10-CM | POA: Diagnosis not present

## 2022-12-31 LAB — RAD ONC ARIA SESSION SUMMARY
Course Elapsed Days: 5
Plan Fractions Treated to Date: 3
Plan Prescribed Dose Per Fraction: 12 Gy
Plan Total Fractions Prescribed: 5
Plan Total Prescribed Dose: 60 Gy
Reference Point Dosage Given to Date: 36 Gy
Reference Point Session Dosage Given: 12 Gy
Session Number: 3

## 2023-01-01 ENCOUNTER — Ambulatory Visit: Payer: Medicare Other

## 2023-01-02 ENCOUNTER — Other Ambulatory Visit: Payer: Self-pay

## 2023-01-02 ENCOUNTER — Ambulatory Visit
Admission: RE | Admit: 2023-01-02 | Discharge: 2023-01-02 | Disposition: A | Discharge status: Home / Self Care | Payer: Medicare Other | Source: Ambulatory Visit | Attending: Radiation Oncology | Admitting: Radiation Oncology

## 2023-01-02 DIAGNOSIS — C3411 Malignant neoplasm of upper lobe, right bronchus or lung: Secondary | ICD-10-CM | POA: Diagnosis not present

## 2023-01-02 LAB — RAD ONC ARIA SESSION SUMMARY
Course Elapsed Days: 7
Plan Fractions Treated to Date: 4
Plan Prescribed Dose Per Fraction: 12 Gy
Plan Total Fractions Prescribed: 5
Plan Total Prescribed Dose: 60 Gy
Reference Point Dosage Given to Date: 48 Gy
Reference Point Session Dosage Given: 12 Gy
Session Number: 4

## 2023-01-03 DIAGNOSIS — N2581 Secondary hyperparathyroidism of renal origin: Secondary | ICD-10-CM | POA: Diagnosis not present

## 2023-01-03 DIAGNOSIS — I129 Hypertensive chronic kidney disease with stage 1 through stage 4 chronic kidney disease, or unspecified chronic kidney disease: Secondary | ICD-10-CM | POA: Diagnosis not present

## 2023-01-03 DIAGNOSIS — N1832 Chronic kidney disease, stage 3b: Secondary | ICD-10-CM | POA: Diagnosis not present

## 2023-01-03 DIAGNOSIS — I701 Atherosclerosis of renal artery: Secondary | ICD-10-CM | POA: Diagnosis not present

## 2023-01-04 ENCOUNTER — Ambulatory Visit
Admission: RE | Admit: 2023-01-04 | Discharge: 2023-01-04 | Disposition: A | Discharge status: Home / Self Care | Payer: Medicare Other | Source: Ambulatory Visit | Attending: Radiation Oncology | Admitting: Radiation Oncology

## 2023-01-04 ENCOUNTER — Other Ambulatory Visit: Payer: Self-pay

## 2023-01-04 DIAGNOSIS — C3411 Malignant neoplasm of upper lobe, right bronchus or lung: Secondary | ICD-10-CM | POA: Diagnosis not present

## 2023-01-04 DIAGNOSIS — F17210 Nicotine dependence, cigarettes, uncomplicated: Secondary | ICD-10-CM | POA: Diagnosis not present

## 2023-01-04 DIAGNOSIS — Z510 Encounter for antineoplastic radiation therapy: Secondary | ICD-10-CM | POA: Diagnosis not present

## 2023-01-04 LAB — RAD ONC ARIA SESSION SUMMARY
Course Elapsed Days: 9
Plan Fractions Treated to Date: 5
Plan Prescribed Dose Per Fraction: 12 Gy
Plan Total Fractions Prescribed: 5
Plan Total Prescribed Dose: 60 Gy
Reference Point Dosage Given to Date: 60 Gy
Reference Point Session Dosage Given: 12 Gy
Session Number: 5

## 2023-01-08 NOTE — Radiation Completion Notes (Addendum)
Radiation Oncology         [phone] ________________________________  Name: Kayla Mcclure MRN: [id number]  Date of Service: 01/04/2023  DOB: [birth year]  End of Treatment Note   Diagnosis: Stage IA3, cT1cN0M0, NSCLC, Squamous Cell Carcinoma of the RUL   Intent: Curative     ==========DELIVERED PLANS==========  First Treatment Date: 2022-12-26 - Last Treatment Date: 2023-01-04   Plan Name: Lung_R_SBRT Site: Lung, Right Technique: SBRT/SRT-IMRT Mode: Photon Dose Per Fraction: 12 Gy Prescribed Dose (Delivered / Prescribed): 60 Gy / 60 Gy Prescribed Fxs (Delivered / Prescribed): 5 / 5     ==========ON TREATMENT VISIT DATES========== 2022-12-26, 2022-12-28, 2022-12-31, 2023-01-02, 2023-01-04, 2023-01-04    See weekly On Treatment Notes in Epic for details. The patient tolerated radiation.   The patient will receive a call in about one month from the radiation oncology department. We will plan repeat CT in 6-8 weeks for new baseline.     Osker Mason, PAC

## 2023-01-14 ENCOUNTER — Other Ambulatory Visit: Payer: Self-pay | Admitting: Radiation Oncology

## 2023-01-14 DIAGNOSIS — C3411 Malignant neoplasm of upper lobe, right bronchus or lung: Secondary | ICD-10-CM

## 2023-01-15 LAB — LAB REPORT - SCANNED: EGFR: 48

## 2023-02-12 ENCOUNTER — Telehealth: Payer: Self-pay | Admitting: *Deleted

## 2023-02-12 NOTE — Telephone Encounter (Signed)
CALLED PATIENT TO INFORM OF CT FOR 02-25-23- ARRIVAL TIME- 1:15 PM @ [facility], NO RESTRICTIONS TO SCAN, PATIENT TO RECEIVE RESULTS FROM ALISON PERKINS ON 03-04-23 @ 3 PM VIA TELEPHONE, SPOKE WITH PATIENT AND SHE IS AWARE OF THESE APPTS. AND THE INSTRUCTIONS

## 2023-02-22 DIAGNOSIS — I701 Atherosclerosis of renal artery: Secondary | ICD-10-CM | POA: Diagnosis not present

## 2023-02-22 DIAGNOSIS — N189 Chronic kidney disease, unspecified: Secondary | ICD-10-CM | POA: Diagnosis not present

## 2023-02-22 DIAGNOSIS — N2581 Secondary hyperparathyroidism of renal origin: Secondary | ICD-10-CM | POA: Diagnosis not present

## 2023-02-22 DIAGNOSIS — D509 Iron deficiency anemia, unspecified: Secondary | ICD-10-CM | POA: Diagnosis not present

## 2023-02-22 DIAGNOSIS — Z23 Encounter for immunization: Secondary | ICD-10-CM | POA: Diagnosis not present

## 2023-02-22 DIAGNOSIS — I130 Hypertensive heart and chronic kidney disease with heart failure and stage 1 through stage 4 chronic kidney disease, or unspecified chronic kidney disease: Secondary | ICD-10-CM | POA: Diagnosis not present

## 2023-02-22 DIAGNOSIS — J449 Chronic obstructive pulmonary disease, unspecified: Secondary | ICD-10-CM | POA: Diagnosis not present

## 2023-02-22 DIAGNOSIS — Z0000 Encounter for general adult medical examination without abnormal findings: Secondary | ICD-10-CM | POA: Diagnosis not present

## 2023-02-22 DIAGNOSIS — E7800 Pure hypercholesterolemia, unspecified: Secondary | ICD-10-CM | POA: Diagnosis not present

## 2023-02-22 DIAGNOSIS — I739 Peripheral vascular disease, unspecified: Secondary | ICD-10-CM | POA: Diagnosis not present

## 2023-02-22 DIAGNOSIS — E785 Hyperlipidemia, unspecified: Secondary | ICD-10-CM | POA: Diagnosis not present

## 2023-02-22 DIAGNOSIS — C3411 Malignant neoplasm of upper lobe, right bronchus or lung: Secondary | ICD-10-CM | POA: Diagnosis not present

## 2023-02-22 DIAGNOSIS — I5032 Chronic diastolic (congestive) heart failure: Secondary | ICD-10-CM | POA: Diagnosis not present

## 2023-02-22 DIAGNOSIS — I10 Essential (primary) hypertension: Secondary | ICD-10-CM | POA: Diagnosis not present

## 2023-02-25 ENCOUNTER — Encounter (HOSPITAL_COMMUNITY): Payer: Self-pay

## 2023-02-25 ENCOUNTER — Ambulatory Visit (HOSPITAL_COMMUNITY)
Admission: RE | Admit: 2023-02-25 | Discharge: 2023-02-25 | Disposition: A | Discharge status: Home / Self Care | Payer: Medicare Other | Source: Ambulatory Visit | Attending: Radiation Oncology | Admitting: Radiation Oncology

## 2023-02-25 DIAGNOSIS — C3411 Malignant neoplasm of upper lobe, right bronchus or lung: Secondary | ICD-10-CM | POA: Insufficient documentation

## 2023-02-25 DIAGNOSIS — I700 Atherosclerosis of aorta: Secondary | ICD-10-CM | POA: Diagnosis not present

## 2023-02-25 DIAGNOSIS — J439 Emphysema, unspecified: Secondary | ICD-10-CM | POA: Diagnosis not present

## 2023-02-25 DIAGNOSIS — C3490 Malignant neoplasm of unspecified part of unspecified bronchus or lung: Secondary | ICD-10-CM | POA: Diagnosis not present

## 2023-02-25 LAB — POCT I-STAT CREATININE: Creatinine, Ser: 1.4 mg/dL — ABNORMAL HIGH (ref 0.44–1.00)

## 2023-02-25 MED ORDER — IOHEXOL 300 MG/ML  SOLN: 75.0000 mL | Freq: Once | INTRAMUSCULAR | Status: DC | PRN

## 2023-02-25 MED ORDER — IOHEXOL 300 MG/ML  SOLN
60.0000 mL | Freq: Once | INTRAMUSCULAR | Status: AC | PRN
  Administered 2023-02-25: 60 mL via INTRAVENOUS

## 2023-03-04 ENCOUNTER — Ambulatory Visit
Admission: RE | Admit: 2023-03-04 | Discharge: 2023-03-04 | Disposition: A | Discharge status: Home / Self Care | Payer: Medicare Other | Source: Ambulatory Visit | Attending: Radiation Oncology | Admitting: Radiation Oncology

## 2023-03-04 ENCOUNTER — Encounter: Payer: Self-pay | Admitting: Radiation Oncology

## 2023-03-04 NOTE — Progress Notes (Signed)
Telephone nursing appointment for review of most recent CT-Chest results. I verified patient's identity x2 and began nursing interview.   Patient reports doing well. Patient denies any related issues at this time.   Meaningful use complete.   Patient aware of their 3pm 03/04/2023 telephone appointment w/ Laurence Aly PA-C. I left my extension [phone] in case patient needs anything. Patient verbalized understanding. This concludes the nursing interview.   Patient contact [phone]     Ruel Favors, LPN

## 2023-03-06 ENCOUNTER — Telehealth: Payer: Self-pay | Admitting: Radiation Oncology

## 2023-03-06 DIAGNOSIS — C3411 Malignant neoplasm of upper lobe, right bronchus or lung: Secondary | ICD-10-CM

## 2023-03-06 NOTE — Telephone Encounter (Signed)
I called and spoke with the patient to review her most recent post treatment CT scan and the recommendation for a repeat in 6 months. She is in agreement with this plan.

## 2023-03-11 ENCOUNTER — Ambulatory Visit: Payer: Medicare Other | Admitting: Cardiology

## 2023-03-14 ENCOUNTER — Encounter: Payer: Self-pay | Admitting: Cardiology

## 2023-03-14 ENCOUNTER — Ambulatory Visit: Payer: Medicare Other | Attending: Cardiology | Admitting: Cardiology

## 2023-03-14 VITALS — BP 118/56 | HR 74 | Ht 59.0 in | Wt 123.0 lb

## 2023-03-14 DIAGNOSIS — R0609 Other forms of dyspnea: Secondary | ICD-10-CM | POA: Diagnosis not present

## 2023-03-14 DIAGNOSIS — M7989 Other specified soft tissue disorders: Secondary | ICD-10-CM | POA: Diagnosis not present

## 2023-03-14 DIAGNOSIS — E785 Hyperlipidemia, unspecified: Secondary | ICD-10-CM | POA: Diagnosis not present

## 2023-03-14 DIAGNOSIS — D500 Iron deficiency anemia secondary to blood loss (chronic): Secondary | ICD-10-CM

## 2023-03-14 DIAGNOSIS — I5032 Chronic diastolic (congestive) heart failure: Secondary | ICD-10-CM

## 2023-03-14 DIAGNOSIS — D649 Anemia, unspecified: Secondary | ICD-10-CM

## 2023-03-14 DIAGNOSIS — E669 Obesity, unspecified: Secondary | ICD-10-CM

## 2023-03-14 DIAGNOSIS — I2510 Atherosclerotic heart disease of native coronary artery without angina pectoris: Secondary | ICD-10-CM

## 2023-03-14 DIAGNOSIS — Z720 Tobacco use: Secondary | ICD-10-CM

## 2023-03-14 DIAGNOSIS — Z9861 Coronary angioplasty status: Secondary | ICD-10-CM

## 2023-03-14 DIAGNOSIS — Z87891 Personal history of nicotine dependence: Secondary | ICD-10-CM

## 2023-03-14 DIAGNOSIS — R002 Palpitations: Secondary | ICD-10-CM

## 2023-03-14 DIAGNOSIS — I739 Peripheral vascular disease, unspecified: Secondary | ICD-10-CM | POA: Diagnosis not present

## 2023-03-14 DIAGNOSIS — I10 Essential (primary) hypertension: Secondary | ICD-10-CM

## 2023-03-14 DIAGNOSIS — I25119 Atherosclerotic heart disease of native coronary artery with unspecified angina pectoris: Secondary | ICD-10-CM

## 2023-03-14 DIAGNOSIS — I6523 Occlusion and stenosis of bilateral carotid arteries: Secondary | ICD-10-CM | POA: Diagnosis not present

## 2023-03-14 DIAGNOSIS — I252 Old myocardial infarction: Secondary | ICD-10-CM | POA: Diagnosis not present

## 2023-03-14 MED ORDER — FUROSEMIDE 20 MG PO TABS: 20.0000 mg | ORAL_TABLET | ORAL | 6 refills | Status: AC | PRN

## 2023-03-14 NOTE — Progress Notes (Signed)
Cardiology Office Note:  .   Date:  03/14/2023  ID:  Kayla Mcclure, DOB 05/21/1945, MRN [id number] PCP: Kayla Lund, PA  Kayla Mcclure Cardiologist:  Kayla Lemma, MD     Chief Complaint  Patient presents with   Follow-up    6-8 months.   Shortness of Breath   Edema    Feet and ankles real bad.    Patient Profile: .     Kayla Mcclure is a previously obese (no longer) 77 y.o. female (essentially former smoker) with a PMH reviewed below who presents here for 27-month follow-up at the request of Kayla Mcclure, [place].  Pertinent PMH: CAD/non-STEMI-RCA PCI with residual small caliber distal disease. LifeScan Myoview November 2023: Hyperdynamic LV function with a EF of 77%.  No ischemia or infarction.  LOW RISK.  Prominent RV uptake suggesting elevated PAP. PAD-PTCA of L Com-Ext iliac (2005) Kayla Mcclure L Carotid CEA 07/2019 R Carotid CEA w/ Patch Angioplasty - Kayla Mcclure (07/2021) [id number] S/p Bilat Renal Stents (Kayla Mcclure -12/2021) HTN/ HLD H/o Near Syncope: Reassuring carotid Dopplers, Kayla Mcclure showed bradycardia but frequent episodes of PAT-nonsustained H/o GI Bld -> EGD showed erosive gastropathy. Thrombocytopenia-Plavix was held Bronchiectasis, and pulmonary nodules Former heavy smoker-reduce to 1 cigarettes every couple days. => almost done XRT for R L Ca    Kayla Mcclure was last seen on Aug 20, 2022 for follow-up.  She is doing well.  Very happy.  Feeling much better than previous visit.  Energy level improving.  Has had some GI bleeding and had some electrocautery on angiectasias.  This was done at Kayla Mcclure.  She was open to become more active and in doing so at denied any real exertional chest pain or pressure.  Only some mild exertional dyspnea likely related to anemia.  Occasional left-sided chest twinges but nothing significant.  Able to do routinel ADLs including shopping and cleaning.  I have been off furosemide and losartan.  Was only on Toprol  and statin plus ezetimibe.  Down to smoking maybe 1 or 2 cigarettes every couple days.  Mostly related to stress.  Left ankle/foot pain and joint swelling.  Subjective  Discussed the use of AI scribe software for clinical note transcription with the patient, who gave verbal consent to proceed.  History of Present Illness   The patient, a newly widowed elderly woman, currently living alone, with a PMH notable for CAD, PAD, HTN, HLD and COPD presents with intermittent nocturnal dyspnea that resolves with the use of an inhaler. She denies any associated chest pain, pressure, or tightness. She also reports significant bilateral lower extremity edema, more pronounced on the left, which improves with elevation overnight but worsens with activity during the day. The patient has not been using compression stockings.  The patient has a history of lung nodules and recently underwent radiation therapy for lung cancer. She denies any current tobacco use. She also reports a history of a minor stroke / TIA, which occasionally affects her speech but has not had any recurrent Sx of TIA/CVA or amaurosis fugax.   The patient has been managing her health conditions with a regimen of Toprol, statin, Zetia, and aspirin. She has previously been prescribed Lasix for fluid management, which she took every other day. She also takes Evista for bone health.  She was recently diagnosed with lung cancer, treated with XRT.  Has not smoked since his diagnosis.  The patient's weight has significantly decreased over time, and her cholesterol  levels are well-managed. Her last stress test and cardiac catheterization were conducted a year ago, with normal results. The patient denies any calf or thigh pain during walking, sudden changes in vision, or irregular heartbeats.   Unfortunately, her husband passed away about 3 months ago.  She now lives alone and is pending for self, but she does have water grandsons going to visit her.  She  sees to be handling this quite well.  She is sad and misses him but is moving arm without difficulty.      Cardiovascular ROS:  Per HPI  ROS:  Review of Systems - Negative except symptoms noted in HPI.    Objective   Current Meds  Medication Sig   acetaminophen (TYLENOL) 325 MG tablet Take 650 mg by mouth every 6 (six) hours as needed for mild pain or moderate pain.   albuterol (VENTOLIN HFA) 108 (90 Base) MCG/ACT inhaler Inhale 2 puffs into the lungs every 6 (six) hours as needed for wheezing or shortness of breath.   allopurinol (ZYLOPRIM) 100 MG tablet Take 100 mg by mouth daily.   aspirin EC 81 MG tablet Take 1 tablet (81 mg total) by mouth daily.   atorvastatin (LIPITOR) 80 MG tablet Take 1 tablet (80 mg total) by mouth daily.   ezetimibe (ZETIA) 10 MG tablet Take 1 tablet (10 mg total) by mouth daily.   ferrous gluconate (FERGON) 324 MG tablet Take 324 mg by mouth daily with breakfast.   furosemide (LASIX) 20 MG tablet Take 1 tablet (20 mg total) by mouth as needed. For swelling or shortness of breath   gabapentin (NEURONTIN) 300 MG capsule Take 300 mg by mouth 3 (three) times daily.   metoprolol succinate (TOPROL-XL) 50 MG 24 hr tablet Take 1 tablet (50 mg total) by mouth at bedtime.   raloxifene (EVISTA) 60 MG tablet Take 60 mg by mouth daily.    Studies Reviewed: Marland Kitchen   EKG Interpretation Date/Time:  Thursday March 14 2023 10:22:44 EST Ventricular Rate:  74 PR Interval:  122 QRS Duration:  60 QT Interval:  394 QTC Calculation: 437 R Axis:   89  Text Interpretation: Normal sinus rhythm T wave abnormality, consider inferior ischemia T wave abnormality, consider anterolateral ischemia When compared with ECG of 25-Nov-2022 05:36, Inverted T waves have replaced nonspecific T wave abnormality in Inferior leads T wave inversion now evident in Lateral leads Confirmed by Kayla Mcclure ([id number]) on 03/14/2023 10:35:18 AM    Results LABS Total cholesterol: 155  (08/10/2022) Triglycerides: 84 (08/10/2022) HDL: 77 (08/10/2022) LDL: 62 (08/10/2022)  RADIOLOGY Right leg ultrasound: No deep vein thrombosis (11/2022) Renal artery ultrasound: Normal kidneys, normal renal arteries Abdominal aorta ultrasound: Normal abdominal aorta  DIAGNOSTIC Nuclear stress test: Low risk, normal ejection fraction, no ischemia or infarction, hyperdynamic left ventricle (02/01/2022) Cardiac catheterization: Stent placement, no significant issues (02/2019) Cardiac Cath 02/11/2019: Patent RCA stent with minimal ISR after pRCA 50%).  Small caliber RPAV with ~75%.  pLAD 30%, m-dLAD 40%, D2 40%.  Normal LVEDP and LVEF.    Risk Assessment/Calculations:             Physical Exam:   VS:  BP (!) 118/56 (BP Location: Left Arm, Patient Position: Sitting, Cuff Size: Normal)   Pulse 74   Ht 4\' 11"  (1.499 m)   Wt 123 lb (55.8 kg)   BMI 24.84 kg/m    Wt Readings from Last 3 Encounters:  03/14/23 123 lb (55.8 kg)  12/06/22 118 lb 6.4 oz (53.7  kg)  12/05/22 117 lb 12.8 oz (53.4 kg)    GEN: Well nourished, well developed in no acute distress; healthy appearing NECK: No JVD; No carotid bruits CARDIAC: Normal S1, S2; RRR, no murmurs, rubs, gallops RESPIRATORY:  Clear to auscultation without rales, wheezing or rhonchi ; nonlabored, good air movement. ABDOMEN: Soft, non-tender, non-distended EXTREMITIES:  ~2+ BLE edema; No deformity     ASSESSMENT AND PLAN: .    Problem List Items Addressed This Visit       Cardiology Problems   Bilateral carotid artery disease (HCC) (Chronic)   Followed by Kayla Mcclure.      Relevant Medications   furosemide (LASIX) 20 MG tablet   Other Relevant Orders   EKG 12-Lead (Completed)   CAD S/P percutaneous coronary angioplasty -- Promus DES 2.5 mm x 20 mm postdilated to 3 mm (Chronic)   DES PCI to the RCA.  No longer on Plavix.  Is on aspirin monotherapy.      Relevant Medications   furosemide (LASIX) 20 MG tablet   Other Relevant  Orders   EKG 12-Lead (Completed)   Chronic diastolic CHF (congestive heart failure) (HCC) (Chronic)   NYHA class I symptoms.  Continue Toprol.  No longer on afterload reduction or diuretic.      Relevant Medications   furosemide (LASIX) 20 MG tablet   Coronary artery disease involving native coronary artery of native heart with angina pectoris (HCC) - Primary (Chronic)   No new symptoms of chest pain, pressure, or tightness. No palpitations or feelings of passing out. Last stress test a year ago showed low risk with normal pump function. Last cardiac catheterization was in November 2020. -Continue current medications:Toprol 50mg , Aspirin 81 mg, Rosuvastatin 80mg , and Zetia. -Plan for annual follow-up.      Relevant Medications   furosemide (LASIX) 20 MG tablet   Dyslipidemia, goal LDL below 70 (Chronic)   Relevant Medications   furosemide (LASIX) 20 MG tablet   Essential hypertension, benign (Chronic)   Relevant Medications   furosemide (LASIX) 20 MG tablet   Other Relevant Orders   EKG 12-Lead (Completed)   PAD (peripheral artery disease) - bilateral CIA & CFA 50-69%; Claudication (Chronic)   Relevant Medications   furosemide (LASIX) 20 MG tablet   Other Relevant Orders   EKG 12-Lead (Completed)     Other   Anemia   On iron supplementation. -Continue current management as per primary care provider.      Dyspnea, unspecified (Chronic)   Occurs occasionally at night when lying down, improves with use of inhaler. No associated chest pain or pressure. No nocturnal awakenings due to shortness of breath. -Use Lasix 20mg  as needed if shortness of breath occurs at night.  Spoke cessation counseling.      Former heavy cigarette smoker (20-39 per day) (Chronic)   Recent diagnosis of lung cancer and efforts to quit smoking. Recently try to quit, hopefully she will stop.      History of MI (myocardial infarction)  - NSTEMI with PCI of RCA. (Chronic)   Distant history of RCA PCI.   Thankfully she has not had any further breakthrough spells of angina.  Relatively stable medical regimen.      Iron deficiency anemia (Chronic)   Localized swelling of lower extremity (Chronic)   Noted bilateral lower extremity swelling, worse on the left. No pain in calves or thighs. Swelling improves with elevation at night. No current use of compression stockings. -Start using compression stockings, especially in the morning. -  Elevate legs above the level of the heart when sitting. -Prescribe Lasix 20mg  as needed for worsening swelling.  Explained that she will take this may be 3-3 times a week.      Obesity (BMI 30-39.9) (Chronic)   Weight loss.  Congratulated her efforts.      Palpitations (Chronic)   Short bursts of PAT on monitor.  Stable on current dose of Toprol.      Tobacco use    Lung Cancer Completed radiation therapy for right lung cancer. No current symptoms related to this. -Continue current management as per oncology.  Gout History of gout, currently not active. -Monitor for potential gout flare if using Lasix frequently.  Anemia  Follow-up Stable condition, plan for annual follow-up.           Follow-Up: Return in about 1 year (around 03/13/2024) for 1 Yr Follow-up.  Total time spent: 20 min spent with patient + 17 min spent charting = 37 min     Signed, Marykay Lex, MD, MS Kayla Mcclure, M.D., M.S. Interventional Cardiologist  [facility]  Pager # [phone] Phone # [phone] [address] [place], [place] [address]

## 2023-03-14 NOTE — Assessment & Plan Note (Signed)
DES PCI to the RCA.  No longer on Plavix.  Is on aspirin monotherapy.

## 2023-03-14 NOTE — Assessment & Plan Note (Signed)
Weight loss.  Congratulated her efforts.

## 2023-03-14 NOTE — Assessment & Plan Note (Signed)
NYHA class I symptoms.  Continue Toprol.  No longer on afterload reduction or diuretic.

## 2023-03-14 NOTE — Assessment & Plan Note (Signed)
Noted bilateral lower extremity swelling, worse on the left. No pain in calves or thighs. Swelling improves with elevation at night. No current use of compression stockings. -Start using compression stockings, especially in the morning. -Elevate legs above the level of the heart when sitting. -Prescribe Lasix 20mg  as needed for worsening swelling.  Explained that she will take this may be 3-3 times a week.

## 2023-03-14 NOTE — Patient Instructions (Addendum)
Medication Instructions:   Lasix ( furosemide) 20 mg as needed for swelling or shortness of breath and/or when lying down at night   *If you need a refill on your cardiac medications before your next appointment, please call your pharmacy*   Lab Work: Not needed    Testing/Procedures:    Follow-Up: At [facility], you and your health needs are our priority.  As part of our continuing mission to provide you with exceptional heart care, we have created designated Provider Care Teams.  These Care Teams include your primary Cardiologist (physician) and Advanced Practice Providers (APPs -  Physician Assistants and Nurse Practitioners) who all work together to provide you with the care you need, when you need it.     Your next appointment:   12 month(s)  The format for your next appointment:   In Person  Provider:   Bryan Lemma, MD    Other instructions   recommends you purchase some compression  socks/hose (   Another Name is travel socks ) from [employer] in [place] ,[place].   You do not need an prescription to purchase the items.  Address  [address] [place], [place] [address]  Phone  [phone]   Compression   strength   X 8-15 mmHg 15-20 mmHg                           20-30 mmHg  30-40 mmHg.  You may also try a medical supply store, department store (i.e.- [employer], [employer], [employer], specialty shoe stores ( shoe market), Sports administrator uniform store.

## 2023-03-14 NOTE — Assessment & Plan Note (Addendum)
Distant history of RCA PCI.  Thankfully she has not had any further breakthrough spells of angina.  Relatively stable medical regimen.

## 2023-03-14 NOTE — Assessment & Plan Note (Signed)
On iron supplementation. -Continue current management as per primary care provider.

## 2023-03-14 NOTE — Assessment & Plan Note (Signed)
Short bursts of PAT on monitor.  Stable on current dose of Toprol.

## 2023-03-14 NOTE — Assessment & Plan Note (Signed)
No new symptoms of chest pain, pressure, or tightness. No palpitations or feelings of passing out. Last stress test a year ago showed low risk with normal pump function. Last cardiac catheterization was in November 2020. -Continue current medications:Toprol 50mg , Aspirin 81 mg, Rosuvastatin 80mg , and Zetia. -Plan for annual follow-up.

## 2023-03-14 NOTE — Assessment & Plan Note (Addendum)
Occurs occasionally at night when lying down, improves with use of inhaler. No associated chest pain or pressure. No nocturnal awakenings due to shortness of breath. -Use Lasix 20mg  as needed if shortness of breath occurs at night.  Spoke cessation counseling.

## 2023-03-14 NOTE — Assessment & Plan Note (Signed)
Followed by Dr. Berry. 

## 2023-03-14 NOTE — Assessment & Plan Note (Addendum)
Recent diagnosis of lung cancer and efforts to quit smoking. Recently try to quit, hopefully she will stop.

## 2023-04-20 ENCOUNTER — Other Ambulatory Visit: Payer: Self-pay | Admitting: Cardiology

## 2023-06-03 ENCOUNTER — Other Ambulatory Visit: Payer: Self-pay | Admitting: Family Medicine

## 2023-06-03 DIAGNOSIS — Z1231 Encounter for screening mammogram for malignant neoplasm of breast: Secondary | ICD-10-CM

## 2023-06-04 DIAGNOSIS — R600 Localized edema: Secondary | ICD-10-CM | POA: Diagnosis not present

## 2023-07-01 DIAGNOSIS — I701 Atherosclerosis of renal artery: Secondary | ICD-10-CM | POA: Diagnosis not present

## 2023-07-01 DIAGNOSIS — I129 Hypertensive chronic kidney disease with stage 1 through stage 4 chronic kidney disease, or unspecified chronic kidney disease: Secondary | ICD-10-CM | POA: Diagnosis not present

## 2023-07-01 DIAGNOSIS — R609 Edema, unspecified: Secondary | ICD-10-CM | POA: Diagnosis not present

## 2023-07-01 DIAGNOSIS — N2581 Secondary hyperparathyroidism of renal origin: Secondary | ICD-10-CM | POA: Diagnosis not present

## 2023-07-01 DIAGNOSIS — N1832 Chronic kidney disease, stage 3b: Secondary | ICD-10-CM | POA: Diagnosis not present

## 2023-07-01 DIAGNOSIS — N189 Chronic kidney disease, unspecified: Secondary | ICD-10-CM | POA: Diagnosis not present

## 2023-07-03 LAB — LAB REPORT - SCANNED
Albumin, Urine POC: 155.3
Creatinine, POC: 30 mg/dL
EGFR: 31
Microalb Creat Ratio: 518

## 2023-07-04 ENCOUNTER — Other Ambulatory Visit: Payer: Self-pay | Admitting: Nephrology

## 2023-07-04 DIAGNOSIS — I701 Atherosclerosis of renal artery: Secondary | ICD-10-CM

## 2023-07-05 ENCOUNTER — Ambulatory Visit
Admission: RE | Admit: 2023-07-05 | Discharge: 2023-07-05 | Disposition: A | Discharge status: Home / Self Care | Source: Ambulatory Visit | Attending: Family Medicine | Admitting: Family Medicine

## 2023-07-05 DIAGNOSIS — Z1231 Encounter for screening mammogram for malignant neoplasm of breast: Secondary | ICD-10-CM | POA: Diagnosis not present

## 2023-08-06 ENCOUNTER — Ambulatory Visit
Admission: RE | Admit: 2023-08-06 | Discharge: 2023-08-06 | Disposition: A | Discharge status: Home / Self Care | Source: Ambulatory Visit | Attending: Nephrology | Admitting: Nephrology

## 2023-08-06 DIAGNOSIS — I701 Atherosclerosis of renal artery: Secondary | ICD-10-CM

## 2023-08-06 DIAGNOSIS — Z95828 Presence of other vascular implants and grafts: Secondary | ICD-10-CM | POA: Diagnosis not present

## 2023-08-15 ENCOUNTER — Telehealth: Payer: Self-pay | Admitting: *Deleted

## 2023-08-15 NOTE — Telephone Encounter (Signed)
 Called patient to inform of Ct for 09-04-23- arrival time- 12:30 pm  @ [facility], no restrictions to scan, patient to receive Ct results from Allana Ishikawa on 09-09-23 @ 1 pm via telephone, spoke with patient and she is aware of these appts. and the instructions

## 2023-08-21 DIAGNOSIS — J449 Chronic obstructive pulmonary disease, unspecified: Secondary | ICD-10-CM | POA: Diagnosis not present

## 2023-08-21 DIAGNOSIS — E7800 Pure hypercholesterolemia, unspecified: Secondary | ICD-10-CM | POA: Diagnosis not present

## 2023-08-21 DIAGNOSIS — N189 Chronic kidney disease, unspecified: Secondary | ICD-10-CM | POA: Diagnosis not present

## 2023-08-21 DIAGNOSIS — J4530 Mild persistent asthma, uncomplicated: Secondary | ICD-10-CM | POA: Diagnosis not present

## 2023-08-21 DIAGNOSIS — D509 Iron deficiency anemia, unspecified: Secondary | ICD-10-CM | POA: Diagnosis not present

## 2023-08-21 DIAGNOSIS — I2510 Atherosclerotic heart disease of native coronary artery without angina pectoris: Secondary | ICD-10-CM | POA: Diagnosis not present

## 2023-08-21 DIAGNOSIS — G479 Sleep disorder, unspecified: Secondary | ICD-10-CM | POA: Diagnosis not present

## 2023-08-21 DIAGNOSIS — N2581 Secondary hyperparathyroidism of renal origin: Secondary | ICD-10-CM | POA: Diagnosis not present

## 2023-08-21 DIAGNOSIS — I10 Essential (primary) hypertension: Secondary | ICD-10-CM | POA: Diagnosis not present

## 2023-08-21 DIAGNOSIS — E785 Hyperlipidemia, unspecified: Secondary | ICD-10-CM | POA: Diagnosis not present

## 2023-08-21 DIAGNOSIS — C3411 Malignant neoplasm of upper lobe, right bronchus or lung: Secondary | ICD-10-CM | POA: Diagnosis not present

## 2023-08-21 DIAGNOSIS — Z8739 Personal history of other diseases of the musculoskeletal system and connective tissue: Secondary | ICD-10-CM | POA: Diagnosis not present

## 2023-08-28 ENCOUNTER — Other Ambulatory Visit: Payer: Self-pay | Admitting: Vascular Surgery

## 2023-08-28 DIAGNOSIS — I6523 Occlusion and stenosis of bilateral carotid arteries: Secondary | ICD-10-CM

## 2023-08-28 DIAGNOSIS — Z8249 Family history of ischemic heart disease and other diseases of the circulatory system: Secondary | ICD-10-CM

## 2023-08-28 DIAGNOSIS — I701 Atherosclerosis of renal artery: Secondary | ICD-10-CM

## 2023-09-04 ENCOUNTER — Ambulatory Visit (HOSPITAL_COMMUNITY)
Admission: RE | Admit: 2023-09-04 | Discharge: 2023-09-04 | Disposition: A | Discharge status: Home / Self Care | Source: Ambulatory Visit | Attending: Radiation Oncology | Admitting: Radiation Oncology

## 2023-09-04 ENCOUNTER — Other Ambulatory Visit: Payer: Self-pay | Admitting: Radiation Oncology

## 2023-09-04 DIAGNOSIS — C3411 Malignant neoplasm of upper lobe, right bronchus or lung: Secondary | ICD-10-CM | POA: Diagnosis not present

## 2023-09-04 DIAGNOSIS — I700 Atherosclerosis of aorta: Secondary | ICD-10-CM | POA: Diagnosis not present

## 2023-09-04 LAB — POCT I-STAT CREATININE: Creatinine, Ser: 1.7 mg/dL — ABNORMAL HIGH (ref 0.44–1.00)

## 2023-09-04 MED ORDER — SODIUM CHLORIDE (PF) 0.9 % IJ SOLN
INTRAMUSCULAR | Status: AC
Start: 2023-09-04 — End: ?
  Filled 2023-09-04: qty 50

## 2023-09-06 ENCOUNTER — Encounter: Payer: Self-pay | Admitting: Radiation Oncology

## 2023-09-06 NOTE — Progress Notes (Addendum)
 Completed on 09/06/2023  Telephone nursing appointment for review of most recent CT-Chest results. I verified patient's identity x2 and began nursing interview.   Diagnosis:  Stage IA3, cT1cN0M0, NSCLC, Squamous Cell Carcinoma of the RUL   Patient states issues as follows...  -Pain: Denies -Fatigue: Denies -Skin: Denies -Lungs: Occasional SOB. Albuterol  helps. -Appetite: Good   Patient denies any other related issues at this time.   Meaningful use complete.   Patient aware of their telephone appointment w/ A PA-C. I left my extension [phone] in case patient needs anything. Patient verbalized understanding. This concludes the nursing interview.   Patient preferred phone # [phone]   Avery Bodo, LPN

## 2023-09-09 ENCOUNTER — Ambulatory Visit
Admission: RE | Admit: 2023-09-09 | Discharge: 2023-09-09 | Disposition: A | Discharge status: Home / Self Care | Payer: Medicare Other | Source: Ambulatory Visit | Attending: Radiation Oncology | Admitting: Radiation Oncology

## 2023-09-09 VITALS — Ht 59.0 in | Wt 118.0 lb

## 2023-09-09 DIAGNOSIS — C3411 Malignant neoplasm of upper lobe, right bronchus or lung: Secondary | ICD-10-CM

## 2023-09-09 DIAGNOSIS — F17210 Nicotine dependence, cigarettes, uncomplicated: Secondary | ICD-10-CM | POA: Diagnosis not present

## 2023-09-09 NOTE — Progress Notes (Signed)
 Radiation Oncology         [phone]) [phone] ________________________________   Outpatient Follow Up - Conducted via telephone at patient request.  I spoke with the patient to conduct this consult visit via telephone. The patient was notified in advance and was offered an in person or telemedicine meeting to allow for face to face communication but instead preferred to proceed with a telephone visit.    Name: Kayla Mcclure        MRN: [id number]  Date of Service: 09/09/2023 DOB: 20-Jul-1945  NW:GNFAOZ, [name], PA  Sinda Duel, [place]     REFERRING PHYSICIAN: Sinda Duel, [place]   DIAGNOSIS: The encounter diagnosis was Malignant neoplasm of right upper lobe of lung (HCC).   HISTORY OF PRESENT ILLNESS: Kayla Mcclure is a 78 y.o. female with a Stage IA3, cT1cN0M0, NSCLC, Squamous Cell Carcinoma of the RUL diagnosed after lung cancer screening scan identified a mass in the RUL in 2023. This was worked up with Stage IA3, cT1cN0M0, NSCLC, Squamous Cell Carcinoma of the RUL PET scan on 05/16/2021 that showed hypermetabolic activity in the left upper lobe with an SUV of 4.5, the nodule measured 1.3 cm.  There was also a 1.3 cm right upper lobe nodule posterior to the bronchus with an SUV of 4.6, and the smaller left upper lobe nodule measuring 5 mm with SUV of 2.6.  She underwent a bronchoscopy on 06/06/2021 which showed no malignant cells in the fine-needle aspirate or brushings of the left upper lobe, and no malignant cells seen in the right lower lobe fine-needle aspirate.  She continued with surveillance imaging with some waxing and waning of multiple nodules in the lungs. A CT chest on 10/09/2022 that showed a solitary lesion that was very irregular in appearance measuring 2.3 cm and this was macrolobulated with spiculated margins, a PET scan on 11/05/2022 showed the nodule to measure 2.9 cm with an SUV of 7.1 and was new from her scans in March 2023.  There was a right paratracheal lymph node that  had an SUV of 3.5 and a right perihilar lymph node with an SUV of 3.4.  No other evidence of metastatic disease was appreciated.  She did have some shortness of breath and was seen for an urgent CTPA on 11/25/2022 that showed similar features in comparison to her prior images related to her lung nodules and no evidence of pulmonary embolism.  She underwent a bronchoscopy on 11/27/2022, this showed no malignant cells in the station 7 lymph node, though fine-needle aspirate in the right upper lobe showed squamous cell carcinoma.  She was not felt to be a good candidate for surgical intervention, and rather completed stereotactic body radiotherapy (SBRT) in October of 2024.  Since her treatment she's done well and been without signs of disease. Her most recent CT scan on 09/04/22 has been performed but is not yet read. She's contacted to communicate this information.     PREVIOUS RADIATION THERAPY:   12/26/22-01/04/23 Plan Name: Lung_R_SBRT Site: Lung, Right Technique: SBRT/SRT-IMRT Mode: Photon Dose Per Fraction: 12 Gy Prescribed Dose (Delivered / Prescribed): 60 Gy / 60 Gy Prescribed Fxs (Delivered / Prescribed): 5 / 5   PAST MEDICAL HISTORY:  Past Medical History:  Diagnosis Date   Acute GI bleeding 08/07/2019   Initially was in September 2017   Asthma 10/31/2007   CAD (coronary artery disease) 08/05/2007   a) NSTEMI 2009: PCI to the RCA Promus DES 2.5 mm x 23 mm (  3.0 mm). b) Myoview  June 2017: Nonspecific ST changes. EF greater than 65%. LOW RISK, normal study. No ischemia or infarction.   Carotid artery disease [facility])    s/p Bilateral CEA March & May 2020.    Chronic back pain    Chronic diastolic heart failure (HCC)    CKD (chronic kidney disease), stage III (HCC)    Diverticulosis 04/11/2010   Colonoscopy   Dyslipidemia, goal LDL below 70    Essential hypertension    a. Normal renal arteries by PV angio 05/2013.   GERD (gastroesophageal reflux disease)    History of kidney stones  1975   surgery to remove   History of recent blood transfusion 12/2015   12-19-15, 12-20-15 and 12-21-15   Insomnia    Internal hemorrhoids 04/11/2010   By colonoscopy   Iron  deficiency anemia    Lung nodules 05/2021   bilateral   Myocardial infarction [facility]) 2009   Peripheral arterial disease (HCC)    a. s/p PTA and stenting of L CIA stenosis 05/2013.   Stroke [facility])    mini stroke   Tobacco abuse    Quit in 05/2013       PAST SURGICAL HISTORY: Past Surgical History:  Procedure Laterality Date   BRONCHIAL BIOPSY  06/06/2021   Procedure: BRONCHIAL BIOPSIES;  Surgeon: Prudy Brownie, DO;  Location: [facility] ENDOSCOPY;  Service: Pulmonary;;   BRONCHIAL BIOPSY  11/27/2022   Procedure: BRONCHIAL BIOPSIES;  Surgeon: Prudy Brownie, DO;  Location: [facility] ENDOSCOPY;  Service: Pulmonary;;   BRONCHIAL BRUSHINGS  06/06/2021   Procedure: BRONCHIAL BRUSHINGS;  Surgeon: Prudy Brownie, DO;  Location: [facility] ENDOSCOPY;  Service: Pulmonary;;   BRONCHIAL NEEDLE ASPIRATION BIOPSY  06/06/2021   Procedure: BRONCHIAL NEEDLE ASPIRATION BIOPSIES;  Surgeon: Prudy Brownie, DO;  Location: [facility] ENDOSCOPY;  Service: Pulmonary;;   BRONCHIAL NEEDLE ASPIRATION BIOPSY  11/27/2022   Procedure: BRONCHIAL NEEDLE ASPIRATION BIOPSIES;  Surgeon: Prudy Brownie, DO;  Location: [facility] ENDOSCOPY;  Service: Pulmonary;;   CAPSULE ENDOSCOPY  10/27/2019   [facility]) evidence of "pinhole leaking" in both stomach and proximal small bowel; no overt bleeding noted   CHOLECYSTECTOMY  1975   COLONOSCOPY  04/11/2010   COLONOSCOPY  11/30/2015   CORONARY ANGIOPLASTY WITH STENT PLACEMENT  08/05/2007   Promus DES 2.5 mm x 23 mm (3 mm)  to RCA; EF 60-70%   ENDARTERECTOMY Left 07/28/2019   Procedure: ENDARTERECTOMY CAROTID;  Surgeon: Adine Hoof, MD;  Location: [facility] OR;  Service: Vascular;  Laterality: Left;   ENDARTERECTOMY Right 08/27/2019   Procedure: ENDARTERECTOMY CAROTID RIGHT WITH PATCH ANGIOPLASTY;  Surgeon: Adine Hoof, MD;   Location: [facility] OR;  Service: Vascular;  Laterality: Right;   ENDOBRONCHIAL ULTRASOUND Bilateral 11/27/2022   Procedure: ENDOBRONCHIAL ULTRASOUND;  Surgeon: Prudy Brownie, DO;  Location: [facility] ENDOSCOPY;  Service: Pulmonary;  Laterality: Bilateral;   ENTEROSCOPY N/A 12/27/2015   Procedure: ENTEROSCOPY;  Surgeon: Ozell Blunt, MD;  Location: [facility] ENDOSCOPY;  Service: Endoscopy;  Laterality: N/A;  also needs slim egd scope   ENTEROSCOPY N/A 05/15/2019   Procedure: ENTEROSCOPY;  Surgeon: Lanita Pitman, MD;  Location: [facility] ENDOSCOPY;  Service: Endoscopy;  Laterality: N/A;  Push enteroscopy using either ultraslim colonoscope or push enteroscope   ENTEROSCOPY N/A 08/08/2019   Procedure: ENTEROSCOPY;  Surgeon: Genell Ken, MD;  Location: [facility] ENDOSCOPY;  Service: Gastroenterology;  Laterality: N/A;   ENTEROSCOPY N/A 03/22/2022   Procedure: ENTEROSCOPY;  Surgeon: Evangeline Hilts, MD;  Location: [facility] ENDOSCOPY;  Service: Gastroenterology;  Laterality:  N/A;   ESOPHAGOGASTRODUODENOSCOPY N/A 09/13/2013   Procedure: ESOPHAGOGASTRODUODENOSCOPY (EGD);  Surgeon: Yvetta Herbert, MD;  Location: [facility] ENDOSCOPY;  Service: Endoscopy;  Laterality: N/A;   ESOPHAGOGASTRODUODENOSCOPY N/A 07/14/2015   Procedure: ESOPHAGOGASTRODUODENOSCOPY (EGD);  Surgeon: Ozell Blunt, MD;  Location: [facility] ENDOSCOPY;  Service: Endoscopy;  Laterality: N/A;   ESOPHAGOGASTRODUODENOSCOPY (EGD) WITH PROPOFOL  N/A 09/09/2020   Procedure: ESOPHAGOGASTRODUODENOSCOPY (EGD) WITH PROPOFOL ;  Surgeon: Ozell Blunt, MD;  Location: [facility] ENDOSCOPY;  Service: Endoscopy;  Laterality: N/A;   FIDUCIAL MARKER PLACEMENT  06/06/2021   Procedure: FIDUCIAL MARKER PLACEMENT;  Surgeon: Prudy Brownie, DO;  Location: [facility] ENDOSCOPY;  Service: Pulmonary;;   FIDUCIAL MARKER PLACEMENT  11/27/2022   Procedure: FIDUCIAL MARKER PLACEMENT;  Surgeon: Prudy Brownie, DO;  Location: [facility] ENDOSCOPY;  Service: Pulmonary;;   gi bleed  07/2015   blood given   GIVENS CAPSULE STUDY N/A 03/20/2022    Procedure: GIVENS CAPSULE STUDY;  Surgeon: Evangeline Hilts, MD;  Location: [facility] ENDOSCOPY;  Service: Gastroenterology;  Laterality: N/A;   HOT HEMOSTASIS N/A 12/27/2015   Procedure: HOT HEMOSTASIS (ARGON PLASMA COAGULATION/BICAP);  Surgeon: Ozell Blunt, MD;  Location: [facility];  Service: Endoscopy;  Laterality: N/A;   HOT HEMOSTASIS N/A 08/08/2019   Procedure: HOT HEMOSTASIS (ARGON PLASMA COAGULATION/BICAP);  Surgeon: Genell Ken, MD;  Location: [facility] ENDOSCOPY;  Service: Gastroenterology;  Laterality: N/A;   HOT HEMOSTASIS N/A 09/09/2020   Procedure: HOT HEMOSTASIS (ARGON PLASMA COAGULATION/BICAP);  Surgeon: Ozell Blunt, MD;  Location: [facility] ENDOSCOPY;  Service: Endoscopy;  Laterality: N/A;   ILIAC ARTERY STENT Left 05/04/2013   L CIA-EIA -- Dr. Katheryne Pane   LEFT HEART CATH AND CORONARY ANGIOGRAPHY N/A 02/11/2019   Procedure: LEFT HEART CATH AND CORONARY ANGIOGRAPHY;  Surgeon: Arleen Lacer, MD;  Location: [facility] INVASIVE CV LAB;;-  Patent RCA stent with minimal ISR after pRCA 50%).  Small caliber RPAV with ~75%.  pLAD 30%, m-dLAD 40%, D2 40%.  Normal LVEDP and LVEF.   LOWER EXTREMITY ANGIOGRAM Bilateral 05/04/2013   Procedure: LOWER EXTREMITY ANGIOGRAM;  Surgeon: Avanell Leigh, MD;  Location: [facility] CATH LAB;  Service: Cardiovascular;  Laterality: Bilateral;   NM MYOVIEW  LTD  09/07/2015   EF > 65%. Nonspecific ST changes but no ischemic changes. No ischemia or infarction. LOW RISK   PATCH ANGIOPLASTY Left 07/28/2019   Procedure: PATCH ANGIOPLASTY USING Corinna Dickens BIOLOGIC PATCH;  Surgeon: Adine Hoof, MD;  Location: [facility] OR;  Service: Vascular;  Laterality: Left;   RENAL ANGIOGRAPHY N/A 01/15/2022   Procedure: RENAL ANGIOGRAPHY;  Surgeon: Adine Hoof, MD;  Location: [facility] INVASIVE CV LAB;  Service: Cardiovascular;  Laterality: N/A;   TONSILLECTOMY  1960's   TRANSTHORACIC ECHOCARDIOGRAM  05/14/2019   Normal EF 60 to 65%.  No R WMA.   Normal RV but mildly elevated pressures.  Relatively normal  aortic and mitral valves.   UPPER GI ENDOSCOPY  12/14/2015   VIDEO BRONCHOSCOPY WITH RADIAL ENDOBRONCHIAL ULTRASOUND  06/06/2021   Procedure: RADIAL ENDOBRONCHIAL ULTRASOUND;  Surgeon: Prudy Brownie, DO;  Location: [facility] ENDOSCOPY;  Service: Pulmonary;;     FAMILY HISTORY:  Family History  Problem Relation Age of Onset   Diabetes Mother    Heart disease Mother    Heart attack Mother 68   Breast cancer Mother 74   Colon cancer Mother 48   Heart attack Father 82   Pneumonia Father    Diabetes Sister    Breast cancer Sister    Breast cancer Sister 61     SOCIAL HISTORY:  reports that she has been smoking cigarettes. She has a 20 pack-year smoking history. She has never been exposed to tobacco smoke. She has never used smokeless tobacco. She reports current alcohol use. She reports that she does not use drugs. The patient is married and lives in [place]. She has 5 grand children, and 5 great grandchildren, most of whom she sees quite often.    ALLERGIES: Diclofenac sodium, Nsaids, and Prednisone   MEDICATIONS:  Current Outpatient Medications  Medication Sig Dispense Refill   acetaminophen  (TYLENOL ) 325 MG tablet Take 650 mg by mouth every 6 (six) hours as needed for mild pain or moderate pain.     albuterol  (VENTOLIN  HFA) 108 (90 Base) MCG/ACT inhaler Inhale 2 puffs into the lungs every 6 (six) hours as needed for wheezing or shortness of breath.     allopurinol  (ZYLOPRIM ) 100 MG tablet Take 100 mg by mouth daily.     aspirin  EC 81 MG tablet Take 1 tablet (81 mg total) by mouth daily. 30 tablet    atorvastatin  (LIPITOR ) 80 MG tablet Take 1 tablet (80 mg total) by mouth daily. 90 tablet 3   ezetimibe  (ZETIA ) 10 MG tablet Take 1 tablet (10 mg total) by mouth daily. 90 tablet 3   ferrous gluconate  (FERGON) 324 MG tablet Take 324 mg by mouth daily with breakfast.     furosemide  (LASIX ) 20 MG tablet Take 1 tablet (20 mg total) by mouth as needed. For swelling or shortness of breath 20  tablet 6   gabapentin  (NEURONTIN ) 300 MG capsule Take 300 mg by mouth 3 (three) times daily.     metoprolol  succinate (TOPROL -XL) 50 MG 24 hr tablet Take 1 tablet (50 mg total) by mouth at bedtime. 90 tablet 3   raloxifene (EVISTA) 60 MG tablet Take 60 mg by mouth daily.     No current facility-administered medications for this encounter.     REVIEW OF SYSTEMS: On review of systems, the patient reports that she is doing well. She had a nice birthday this weekend with her family and friends. She is getting around well in her words and has some baseline shortness of breath that is relieved by her rescue inhaler. No other complaints are verbalized.     PHYSICAL EXAM:  Unable to assess due to encounter type.    ECOG = 1  0 - Asymptomatic (Fully active, able to carry on all predisease activities without restriction)  1 - Symptomatic but completely ambulatory (Restricted in physically strenuous activity but ambulatory and able to carry out work of a light or sedentary nature. For example, light housework, office work)  2 - Symptomatic, <50% in bed during the day (Ambulatory and capable of all self care but unable to carry out any work activities. Up and about more than 50% of waking hours)  3 - Symptomatic, >50% in bed, but not bedbound (Capable of only limited self-care, confined to bed or chair 50% or more of waking hours)  4 - Bedbound (Completely disabled. Cannot carry on any self-care. Totally confined to bed or chair)  5 - Death   Aurea Blossom MM, Creech RH, Tormey DC, et al. [phone]). "Toxicity and response criteria of the [facility] Group". Am. Hillard Lowes. Oncol. 5 (6): 649-55    LABORATORY DATA:  Lab Results  Component Value Date   WBC 6.8 11/26/2022   HGB 12.2 11/26/2022   HCT 40.0 11/26/2022   MCV 91.3 11/26/2022   PLT 177 11/26/2022   Lab Results  Component Value Date   NA 135 11/26/2022   K 4.7 11/26/2022   CL 105 11/26/2022   CO2 25 11/26/2022   Lab  Results  Component Value Date   ALT 10 11/26/2022   AST 16 11/26/2022   ALKPHOS 43 11/26/2022   BILITOT 0.9 11/26/2022      RADIOGRAPHY: No results found.     IMPRESSION/PLAN: 1. Stage IA3, cT1cN0M0, NSCLC, Squamous Cell Carcinoma of the RUL. The patient is clinically doing well and I will contact her once her scan has been read. I suspect we will have reassuring findings and continue with scans in 6 month intervals. She's in agreement with this plan.  2. Hypermetabolic activity in the mediastinal nodes, negative on cytology. These will be followed closely on scan.   This encounter was conducted via telephone.  The patient has provided two factor identification and has given verbal consent for this type of encounter and has been advised to only accept a meeting of this type in a secure network environment. The time spent during this encounter was 35 minutes including preparation, discussion, and coordination of the patient's care. The attendants for this meeting include   Bettejane Brownie  and Camiya Lizette Righter.  During the encounter,   Bettejane Brownie was located at [facility] Radiation Oncology Department.  Kayla Mcclure was located at home.    Shelvia Dick, [facility]   **Disclaimer: This note was dictated with voice recognition software. Similar sounding words can inadvertently be transcribed and this note may contain transcription errors which may not have been corrected upon publication of note.**

## 2023-09-12 ENCOUNTER — Telehealth: Payer: Self-pay | Admitting: Radiation Oncology

## 2023-09-12 NOTE — Telephone Encounter (Signed)
 I called the patient and reviewed her CT scan and our plans to repeat in 6 months time. She is in agreement with this from our prior discussion on Monday this week.

## 2023-09-13 ENCOUNTER — Ambulatory Visit: Payer: Self-pay | Admitting: Cardiology

## 2023-09-17 NOTE — Progress Notes (Signed)
 HISTORY AND PHYSICAL     CC:  follow up. Requesting Provider:  Sinda Duel, PA  HPI: This is a 78 y.o. female here for follow up for carotid artery stenosis.  She has hx of left CEA on 07/28/2019 by Dr. Vikki Graves for symptomatic carotid artery stenosis.  She had a right CEA 08/27/2019 for asymptomatic carotid artery stenosis on 08/27/2019 by Dr. Vikki Graves.  She had arteriogram with bilateral renal artery stenting on 01/15/2022 by Dr. Vikki Graves.     She has hx of left iliac artery stenting in 2015 by Dr. Katheryne Pane.   Pt was last seen 11/14/2022 and at that time she was not having and claudication, abdominal or back pain.   Pt returns today for follow up.    Pt denies any amaurosis fugax, weakness, numbness, paralysis or clumsiness or facial droop.  She states that she has had some trouble finding her words since her carotid surgery and this is not new.  She states that she saw a neurologist recently but I am unable to find documentation of this.    She denies any claudication, rest pain or non healing wounds.  She states she has some cramps in her toes at night and some ankle pains bilaterally that is not associated with walking or rest.   She is compliant with her asa/statin.  She states that she has quit smoking for a while now.   She follows with nephrologist for kidney disease.   The pt is on a statin for cholesterol management.  The pt is on a daily aspirin .   Other AC:  none The pt is on diuretic, BB for hypertension.   The pt is not on medication for diabetes Tobacco hx:  current  Pt does not have family hx of AAA.  Past Medical History:  Diagnosis Date   Acute GI bleeding 08/07/2019   Initially was in September 2017   Asthma 10/31/2007   CAD (coronary artery disease) 08/05/2007   a) NSTEMI 2009: PCI to the RCA Promus DES 2.5 mm x 23 mm (3.0 mm). b) Myoview  June 2017: Nonspecific ST changes. EF greater than 65%. LOW RISK, normal study. No ischemia or infarction.   Carotid artery disease [facility])     s/p Bilateral CEA March & May 2020.    Chronic back pain    Chronic diastolic heart failure (HCC)    CKD (chronic kidney disease), stage III (HCC)    Diverticulosis 04/11/2010   Colonoscopy   Dyslipidemia, goal LDL below 70    Essential hypertension    a. Normal renal arteries by PV angio 05/2013.   GERD (gastroesophageal reflux disease)    History of kidney stones 1975   surgery to remove   History of recent blood transfusion 12/2015   12-19-15, 12-20-15 and 12-21-15   Insomnia    Internal hemorrhoids 04/11/2010   By colonoscopy   Iron  deficiency anemia    Lung nodules 05/2021   bilateral   Myocardial infarction [facility]) 2009   Peripheral arterial disease (HCC)    a. s/p PTA and stenting of L CIA stenosis 05/2013.   Stroke [facility])    mini stroke   Tobacco abuse    Quit in 05/2013    Past Surgical History:  Procedure Laterality Date   BRONCHIAL BIOPSY  06/06/2021   Procedure: BRONCHIAL BIOPSIES;  Surgeon: Prudy Brownie, DO;  Location: [facility] ENDOSCOPY;  Service: Pulmonary;;   BRONCHIAL BIOPSY  11/27/2022   Procedure: BRONCHIAL BIOPSIES;  Surgeon: Prudy Brownie,  DO;  Location: [facility];  Service: Pulmonary;;   BRONCHIAL BRUSHINGS  06/06/2021   Procedure: BRONCHIAL BRUSHINGS;  Surgeon: Prudy Brownie, DO;  Location: [facility];  Service: Pulmonary;;   BRONCHIAL NEEDLE ASPIRATION BIOPSY  06/06/2021   Procedure: BRONCHIAL NEEDLE ASPIRATION BIOPSIES;  Surgeon: Prudy Brownie, DO;  Location: [facility];  Service: Pulmonary;;   BRONCHIAL NEEDLE ASPIRATION BIOPSY  11/27/2022   Procedure: BRONCHIAL NEEDLE ASPIRATION BIOPSIES;  Surgeon: Prudy Brownie, DO;  Location: [facility];  Service: Pulmonary;;   CAPSULE ENDOSCOPY  10/27/2019   [facility]) evidence of pinhole leaking in both stomach and proximal small bowel; no overt bleeding noted   CHOLECYSTECTOMY  1975   COLONOSCOPY  04/11/2010   COLONOSCOPY  11/30/2015   CORONARY ANGIOPLASTY WITH STENT PLACEMENT  08/05/2007   Promus DES  2.5 mm x 23 mm (3 mm)  to RCA; EF 60-70%   ENDARTERECTOMY Left 07/28/2019   Procedure: ENDARTERECTOMY CAROTID;  Surgeon: Adine Hoof, MD;  Location: [facility] OR;  Service: Vascular;  Laterality: Left;   ENDARTERECTOMY Right 08/27/2019   Procedure: ENDARTERECTOMY CAROTID RIGHT WITH PATCH ANGIOPLASTY;  Surgeon: Adine Hoof, MD;  Location: [facility] OR;  Service: Vascular;  Laterality: Right;   ENDOBRONCHIAL ULTRASOUND Bilateral 11/27/2022   Procedure: ENDOBRONCHIAL ULTRASOUND;  Surgeon: Prudy Brownie, DO;  Location: [facility];  Service: Pulmonary;  Laterality: Bilateral;   ENTEROSCOPY N/A 12/27/2015   Procedure: ENTEROSCOPY;  Surgeon: Ozell Blunt, MD;  Location: [facility] END[facility];  Service: Endoscopy;  Laterality: N/A;  also needs slim egd scope   ENTEROSCOPY N/A 05/15/2019   Procedure: ENTEROSCOPY;  Surgeon: Lanita Pitman, MD;  Location: [facility] ENDOSCOPY;  Service: Endoscopy;  Laterality: N/A;  Push enteroscopy using either ultraslim colonoscope or push enteroscope   ENTEROSCOPY N/A 08/08/2019   Procedure: ENTEROSCOPY;  Surgeon: Genell Ken, MD;  Location: [facility] ENDOSCOPY;  Service: Gastroenterology;  Laterality: N/A;   ENTEROSCOPY N/A 03/22/2022   Procedure: ENTEROSCOPY;  Surgeon: Evangeline Hilts, MD;  Location: [facility] ENDOSCOPY;  Service: Gastroenterology;  Laterality: N/A;   ESOPHAGOGASTRODUODENOSCOPY N/A 09/13/2013   Procedure: ESOPHAGOGASTRODUODENOSCOPY (EGD);  Surgeon: Yvetta Herbert, MD;  Location: [facility] ENDOSCOPY;  Service: Endoscopy;  Laterality: N/A;   ESOPHAGOGASTRODUODENOSCOPY N/A 07/14/2015   Procedure: ESOPHAGOGASTRODUODENOSCOPY (EGD);  Surgeon: Ozell Blunt, MD;  Location: [facility] ENDOSCOPY;  Service: Endoscopy;  Laterality: N/A;   ESOPHAGOGASTRODUODENOSCOPY (EGD) WITH PROPOFOL  N/A 09/09/2020   Procedure: ESOPHAGOGASTRODUODENOSCOPY (EGD) WITH PROPOFOL ;  Surgeon: Ozell Blunt, MD;  Location: [facility] ENDOSCOPY;  Service: Endoscopy;  Laterality: N/A;   FIDUCIAL MARKER PLACEMENT  06/06/2021    Procedure: FIDUCIAL MARKER PLACEMENT;  Surgeon: Prudy Brownie, DO;  Location: [facility];  Service: Pulmonary;;   FIDUCIAL MARKER PLACEMENT  11/27/2022   Procedure: FIDUCIAL MARKER PLACEMENT;  Surgeon: Prudy Brownie, DO;  Location: [facility];  Service: Pulmonary;;   gi bleed  07/2015   blood given   GIVENS CAPSULE STUDY N/A 03/20/2022   Procedure: GIVENS CAPSULE STUDY;  Surgeon: Evangeline Hilts, MD;  Location: [facility] ENDOSCOPY;  Service: Gastroenterology;  Laterality: N/A;   HOT HEMOSTASIS N/A 12/27/2015   Procedure: HOT HEMOSTASIS (ARGON PLASMA COAGULATION/BICAP);  Surgeon: Ozell Blunt, MD;  Location: [facility];  Service: Endoscopy;  Laterality: N/A;   HOT HEMOSTASIS N/A 08/08/2019   Procedure: HOT HEMOSTASIS (ARGON PLASMA COAGULATION/BICAP);  Surgeon: Genell Ken, MD;  Location: [facility] ENDOSCOPY;  Service: Gastroenterology;  Laterality: N/A;   HOT HEMOSTASIS N/A 09/09/2020   Procedure: HOT HEMOSTASIS (ARGON PLASMA COAGULATION/BICAP);  Surgeon: Ozell Blunt, MD;  Location: [facility]  ENDOSCOPY;  Service: Endoscopy;  Laterality: N/A;   ILIAC ARTERY STENT Left 05/04/2013   L CIA-EIA -- Dr. Katheryne Pane   LEFT HEART CATH AND CORONARY ANGIOGRAPHY N/A 02/11/2019   Procedure: LEFT HEART CATH AND CORONARY ANGIOGRAPHY;  Surgeon: Arleen Lacer, MD;  Location: [facility] INVASIVE CV LAB;;-  Patent RCA stent with minimal ISR after pRCA 50%).  Small caliber RPAV with ~75%.  pLAD 30%, m-dLAD 40%, D2 40%.  Normal LVEDP and LVEF.   LOWER EXTREMITY ANGIOGRAM Bilateral 05/04/2013   Procedure: LOWER EXTREMITY ANGIOGRAM;  Surgeon: Avanell Leigh, MD;  Location: [facility] CATH LAB;  Service: Cardiovascular;  Laterality: Bilateral;   NM MYOVIEW  LTD  09/07/2015   EF > 65%. Nonspecific ST changes but no ischemic changes. No ischemia or infarction. LOW RISK   PATCH ANGIOPLASTY Left 07/28/2019   Procedure: PATCH ANGIOPLASTY USING Corinna Dickens BIOLOGIC PATCH;  Surgeon: Adine Hoof, MD;  Location: [facility] OR;  Service: Vascular;  Laterality:  Left;   RENAL ANGIOGRAPHY N/A 01/15/2022   Procedure: RENAL ANGIOGRAPHY;  Surgeon: Adine Hoof, MD;  Location: [facility] INVASIVE CV LAB;  Service: Cardiovascular;  Laterality: N/A;   TONSILLECTOMY  1960's   TRANSTHORACIC ECHOCARDIOGRAM  05/14/2019   Normal EF 60 to 65%.  No R WMA.   Normal RV but mildly elevated pressures.  Relatively normal aortic and mitral valves.   UPPER GI ENDOSCOPY  12/14/2015   VIDEO BRONCHOSCOPY WITH RADIAL ENDOBRONCHIAL ULTRASOUND  06/06/2021   Procedure: RADIAL ENDOBRONCHIAL ULTRASOUND;  Surgeon: Prudy Brownie, DO;  Location: [facility] ENDOSCOPY;  Service: Pulmonary;;    Allergies  Allergen Reactions   Diclofenac Sodium     Other Reaction(s): History of GI bleeds   Nsaids     Other Reaction(s): History of GI bleeds   Prednisone     Other Reaction(s): History of GI bleeds    Current Outpatient Medications  Medication Sig Dispense Refill   acetaminophen  (TYLENOL ) 325 MG tablet Take 650 mg by mouth every 6 (six) hours as needed for mild pain or moderate pain.     albuterol  (VENTOLIN  HFA) 108 (90 Base) MCG/ACT inhaler Inhale 2 puffs into the lungs every 6 (six) hours as needed for wheezing or shortness of breath.     allopurinol  (ZYLOPRIM ) 100 MG tablet Take 100 mg by mouth daily.     aspirin  EC 81 MG tablet Take 1 tablet (81 mg total) by mouth daily. 30 tablet    atorvastatin  (LIPITOR ) 80 MG tablet Take 1 tablet (80 mg total) by mouth daily. 90 tablet 3   ezetimibe  (ZETIA ) 10 MG tablet Take 1 tablet (10 mg total) by mouth daily. 90 tablet 3   ferrous gluconate  (FERGON) 324 MG tablet Take 324 mg by mouth daily with breakfast.     furosemide  (LASIX ) 20 MG tablet Take 1 tablet (20 mg total) by mouth as needed. For swelling or shortness of breath 20 tablet 6   gabapentin  (NEURONTIN ) 300 MG capsule Take 300 mg by mouth 3 (three) times daily.     metoprolol  succinate (TOPROL -XL) 50 MG 24 hr tablet Take 1 tablet (50 mg total) by mouth at bedtime. 90 tablet 3    raloxifene (EVISTA) 60 MG tablet Take 60 mg by mouth daily.     No current facility-administered medications for this visit.    Family History  Problem Relation Age of Onset   Diabetes Mother    Heart disease Mother    Heart attack Mother 49   Breast cancer Mother 75  Colon cancer Mother 67   Heart attack Father 101   Pneumonia Father    Diabetes Sister    Breast cancer Sister    Breast cancer Sister 40    Social History   Socioeconomic History   Marital status: Married    Spouse name: Not on file   Number of children: 1   Years of education: 12   Highest education level: High school graduate  Occupational History    Employer: RETIRED  Tobacco Use   Smoking status: Every Day    Current packs/day: 0.00    Average packs/day: 0.5 packs/day for 40.0 years (20.0 ttl pk-yrs)    Types: Cigarettes    Last attempt to quit: 05/04/2013    Years since quitting: 10.3    Passive exposure: Never   Smokeless tobacco: Never   Tobacco comments:    Patient states I smoke 1-2 about every two to three days. 10/12/22 Tay  Vaping Use   Vaping status: Never Used  Substance and Sexual Activity   Alcohol use: Yes    Comment: occasionally 3 times a year   Drug use: No   Sexual activity: Not Currently    Partners: Male    Birth control/protection: None, Post-menopausal  Other Topics Concern   Not on file  Social History Narrative   Married. Has one child. Grandmother for a great-grandmother and 1.   No real exercise.   Occasional alcohol consumption.   Quit smoking inf Feb 2015 -- after L Iliac Stent   Right-handed   Caffeine: 1 cup every morning   Right-handed.   Lives at home with husband.   Social Drivers of Corporate investment banker Strain: Not on file  Food Insecurity: No Food Insecurity (09/06/2023)   Hunger Vital Sign    Worried About Running Out of Food in the Last Year: Never true    Ran Out of Food in the Last Year: Never true  Transportation Needs: No Transportation  Needs (09/06/2023)   PRAPARE - Administrator, Civil Service (Medical): No    Lack of Transportation (Non-Medical): No  Physical Activity: Not on file  Stress: Not on file  Social Connections: Not on file  Intimate Partner Violence: Not At Risk (09/06/2023)   Humiliation, Afraid, Rape, and Kick questionnaire    Fear of Current or Ex-Partner: No    Emotionally Abused: No    Physically Abused: No    Sexually Abused: No     REVIEW OF SYSTEMS:   [X]  denotes positive finding, [ ]  denotes negative finding Cardiac  Comments:  Chest pain or chest pressure:    Shortness of breath upon exertion:    Short of breath when lying flat:    Irregular heart rhythm:        Vascular    Pain in calf, thigh, or hip brought on by ambulation:    Pain in feet at night that wakes you up from your sleep:     Blood clot in your veins:    Leg swelling:         Pulmonary    Oxygen  at home:    Productive cough:     Wheezing:         Neurologic    Sudden weakness in arms or legs:     Sudden numbness in arms or legs:     Sudden onset of difficulty speaking or slurred speech:    Temporary loss of vision in one eye:  Problems with dizziness:         Gastrointestinal    Blood in stool:     Vomited blood:         Genitourinary    Burning when urinating:     Blood in urine:        Psychiatric    Major depression:         Hematologic    Bleeding problems:    Problems with blood clotting too easily:        Skin    Rashes or ulcers:        Constitutional    Fever or chills:      PHYSICAL EXAMINATION:  Today's Vitals   09/18/23 0946 09/18/23 0948  BP: (!) 153/62 (!) 153/88  Pulse: 62   Temp: 97.9 F (36.6 C)   TempSrc: Temporal   SpO2: 91%   Weight: 113 lb (51.3 kg)   Height: 4' 11 (1.499 m)   PainSc: 0-No pain    Body mass index is 22.82 kg/m.   General:  WDWN in NAD; vital signs documented above Gait: Not observed HENT: WNL, normocephalic Pulmonary: normal  non-labored breathing Cardiac: regular HR, without carotid bruits Abdomen: soft, NT; aortic pulse is faintly palpable Skin: without rashes Vascular Exam/Pulses:  Right Left  Radial 2+ (normal) 2+ (normal)  DP 2+ (normal) 2+ (normal)   Extremities: without open wounds Musculoskeletal: no muscle wasting or atrophy  Neurologic: A&O X 3; moving all extremities equally; speech is fluent/normal Psychiatric:  The pt has Normal affect.   Non-Invasive Vascular Imaging:   Carotid Duplex on 09/18/2023 Right:  1-39% ICA stenosis Left:  1-39% ICA stenosis   Aorta medicare screening 09/18/2023: Abdominal Aorta Findings:  +-------------+-------+----------+----------+--------+--------+--------+  Location    AP (cm)Trans (cm)PSV (cm/s)WaveformThrombusComments  +-------------+-------+----------+----------+--------+--------+--------+  Proximal    2.00   2.20      114                                 +-------------+-------+----------+----------+--------+--------+--------+  Mid         1.60   1.90      108                                 +-------------+-------+----------+----------+--------+--------+--------+  Distal      2.30   2.20      102                                 +-------------+-------+----------+----------+--------+--------+--------+  RT CIA Prox                   135       biphasic                  +-------------+-------+----------+----------+--------+--------+--------+  RT CIA Mid                    101       biphasic                  +-------------+-------+----------+----------+--------+--------+--------+  RT EIA Prox                   115       biphasic                  +-------------+-------+----------+----------+--------+--------+--------+  RT EIA Mid                    120       biphasic                  +-------------+-------+----------+----------+--------+--------+--------+  RT EIA Distal                 174        biphasic                  +-------------+-------+----------+----------+--------+--------+--------+  LT EIA Prox                   238       biphasic                  +-------------+-------+----------+----------+--------+--------+--------+  LT EIA Mid                    155       biphasic                  +-------------+-------+----------+----------+--------+--------+--------+  LT EIA Distal                 155       biphasic                  +-------------+-------+----------+----------+--------+--------+--------+   Right CFA 131 biphasic waveform, Left CFA 131 cm/s biphasic waveform.   Left Stent(s):  +---------------+--------+---------------+--------+--------+  CIA           PSV cm/sStenosis       WaveformComments  +---------------+--------+---------------+--------+--------+  Prox to Stent  189                    biphasic          +---------------+--------+---------------+--------+--------+  Proximal Stent 198                    biphasic          +---------------+--------+---------------+--------+--------+  Mid Stent      226     50-99% stenosisbiphasic          +---------------+--------+---------------+--------+--------+  Distal Stent   212     50-99% stenosisbiphasic          +---------------+--------+---------------+--------+--------+  Distal to Stent180                    biphasic          +---------------+--------+---------------+--------+--------+   Summary:  Stenosis: +-------------------+-------------+  Location           Stenosis       +-------------------+-------------+  Left External Iliac>50% stenosis  +-------------------+-------------+  IVC/Iliac: 50 - 99 5% stenosis in the left common iliac artery stent.   Previous Carotid duplex on 08/01/2022: Right: 1-39% ICA stenosis Left:   1-39% ICA stenosis  Previous renal artery duplex 08/06/2023: IMPRESSION: 1. No evidence of renal artery stenosis  bilaterally. 2. Echogenic kidneys bilaterally consistent with medical renal disease.   ASSESSMENT/PLAN:: 78 y.o. female here for follow up carotid artery stenosis and has hx of left CEA on 07/28/2019 by Dr. Vikki Graves for symptomatic carotid artery stenosis.  She had a right CEA 08/27/2019 for asymptomatic carotid artery stenosis on 08/27/2019 by Dr. Vikki Graves.  She had arteriogram with bilateral renal artery stenting on 01/15/2022 by Dr. Vikki Graves.    Bilateral carotid artery stenosis -duplex today reveals 1-39% bilateral ICA stenosis.  She states she has had  trouble word finding that has been going on since 2021 after surgery.  She states she has seen a neurologist recently but I cannot find documentation of this.   -discussed s/s of stroke with pt and she understands should she develop any of these sx, she will go to the nearest ER or call 911. -pt will f/u in one year with carotid duplex  Hx left iliac stenting 2015 by Dr. Katheryne Pane -last duplex in 2020 revealed no stenosis of left iliac stents.  Medicare screening today evaluated iliac stent and there is elevated velocities in the iliac stent.  She has palpable pedal pulses and is asymptomatic.  Will f/u in one year with repeat duplex.  She will call sooner if she develops any non healing wounds or rest pain.    She will try mustard for her toe cramps at night.   S/p bilateral renal artery stenting -duplex done last month revealed no evidence of renal artery stenosis -renal artery duplex in one year  Former smoker -fortunately she has quit smoking.  Congratulated her and encouraged her to continue this.  -pt will call sooner should she have any issues. -continue statin/asa    Maryanna Smart, [facility] [phone]  Clinic MD:  Vikki Graves

## 2023-09-18 ENCOUNTER — Ambulatory Visit

## 2023-09-18 ENCOUNTER — Encounter: Payer: Self-pay | Admitting: Physician Assistant

## 2023-09-18 ENCOUNTER — Ambulatory Visit (HOSPITAL_COMMUNITY): Admission: RE | Admit: 2023-09-18 | Source: Ambulatory Visit

## 2023-09-18 ENCOUNTER — Other Ambulatory Visit: Payer: Self-pay | Admitting: Physician Assistant

## 2023-09-18 ENCOUNTER — Ambulatory Visit (HOSPITAL_COMMUNITY)
Admission: RE | Admit: 2023-09-18 | Discharge: 2023-09-18 | Disposition: A | Discharge status: Home / Self Care | Source: Ambulatory Visit | Attending: Vascular Surgery | Admitting: Vascular Surgery

## 2023-09-18 ENCOUNTER — Ambulatory Visit (HOSPITAL_COMMUNITY)
Admission: RE | Admit: 2023-09-18 | Discharge: 2023-09-18 | Discharge status: Home / Self Care | Source: Ambulatory Visit | Attending: Vascular Surgery

## 2023-09-18 VITALS — BP 153/88 | HR 62 | Temp 97.9°F | Ht 59.0 in | Wt 113.0 lb

## 2023-09-18 DIAGNOSIS — Z8249 Family history of ischemic heart disease and other diseases of the circulatory system: Secondary | ICD-10-CM

## 2023-09-18 DIAGNOSIS — I739 Peripheral vascular disease, unspecified: Secondary | ICD-10-CM | POA: Insufficient documentation

## 2023-09-18 DIAGNOSIS — I708 Atherosclerosis of other arteries: Secondary | ICD-10-CM

## 2023-09-18 DIAGNOSIS — I701 Atherosclerosis of renal artery: Secondary | ICD-10-CM | POA: Insufficient documentation

## 2023-09-18 DIAGNOSIS — I6523 Occlusion and stenosis of bilateral carotid arteries: Secondary | ICD-10-CM

## 2023-09-20 DIAGNOSIS — D696 Thrombocytopenia, unspecified: Secondary | ICD-10-CM | POA: Diagnosis not present

## 2023-09-20 DIAGNOSIS — Z8739 Personal history of other diseases of the musculoskeletal system and connective tissue: Secondary | ICD-10-CM | POA: Diagnosis not present

## 2023-09-20 DIAGNOSIS — N189 Chronic kidney disease, unspecified: Secondary | ICD-10-CM | POA: Diagnosis not present

## 2023-10-18 ENCOUNTER — Encounter: Payer: Self-pay | Admitting: Advanced Practice Midwife

## 2023-10-21 IMAGING — CT NM PET TUM IMG INITIAL (PI) SKULL BASE T - THIGH
1 of 7 series · 1 of 25 positions shown · non-contrast
Comparison: CT chest 04/12/2021

CLINICAL DATA: Initial treatment strategy for lung nodule.

EXAM:
NUCLEAR MEDICINE PET SKULL BASE TO THIGH
TECHNIQUE: 6.0 mCi F-18 FDG was injected intravenously. Full-ring PET imaging
was performed from the skull base to thigh after the radiotracer. CT
data was obtained and used for attenuation correction and anatomic
localization.
Fasting blood glucose: 92 mg/dl

[Series 4: ct sk_thigh 5.0 bf37 · axial · 5.0mm · 0.98mm/px · 1 of 204 slices shown]
[im 204/204  brain]
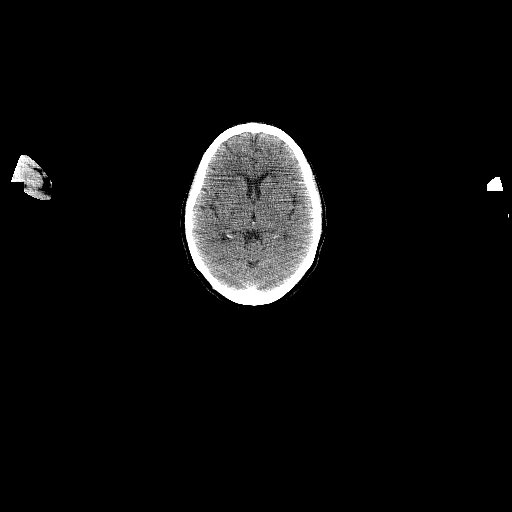

[1 of 25 positions shown; findings below may reference images not displayed]

FINDINGS: Mediastinal blood pool activity: SUV max

Liver activity: SUV max N/A

NECK: No significant abnormal hypermetabolic activity in this
region.

Incidental CT findings: Prior carotid endarterectomy.

CHEST: [DATE] by 1.0 cm left upper lobe nodule on image 53 series 4 has
maximum SUV of 4.5, suspicious for malignancy.

The right upper lobe nodule posterior to the right upper lobe
bronchus on image 64 series 4 measures about 1.3 by 0.9 cm and has a
maximum SUV of 4.6, suspicious for malignancy.

The smaller left upper lobe nodule measuring about 0.5 cm in
diameter on image 58 series 4 has maximum SUV of 2.6, likewise
suspicious given its small size.

Incidental CT findings: Scattered scarring in the lungs. Coronary,
aortic arch, and branch vessel atherosclerotic vascular disease.
Emphysema noted.

ABDOMEN/PELVIS: No significant abnormal hypermetabolic activity in
this region.

Incidental CT findings: Atherosclerosis is present, including
aortoiliac atherosclerotic disease. Densely calcified uterine
fibroid.

SKELETON: No significant abnormal hypermetabolic activity in this
region.

Incidental CT findings: none
IMPRESSION: 1. The 3 pulmonary nodules of concern on recent CT are all
hypermetabolic, favoring multifocal malignancy.
2. Other imaging findings of potential clinical significance: Aortic
Atherosclerosis (4DPT5-WIS.S). Systemic atherosclerosis. Coronary
atherosclerosis. Densely calcified uterine fibroid. Scattered
scarring in the lungs. Emphysema (4DPT5-HPB.X).

## 2023-10-29 NOTE — Telephone Encounter (Signed)
 Kayla Mcclure

## 2023-10-29 NOTE — Progress Notes (Signed)
 This encounter was created in error - please disregard.

## 2024-01-09 DIAGNOSIS — R609 Edema, unspecified: Secondary | ICD-10-CM | POA: Diagnosis not present

## 2024-01-09 DIAGNOSIS — I129 Hypertensive chronic kidney disease with stage 1 through stage 4 chronic kidney disease, or unspecified chronic kidney disease: Secondary | ICD-10-CM | POA: Diagnosis not present

## 2024-01-09 DIAGNOSIS — N1832 Chronic kidney disease, stage 3b: Secondary | ICD-10-CM | POA: Diagnosis not present

## 2024-01-09 DIAGNOSIS — R809 Proteinuria, unspecified: Secondary | ICD-10-CM | POA: Diagnosis not present

## 2024-01-09 DIAGNOSIS — I701 Atherosclerosis of renal artery: Secondary | ICD-10-CM | POA: Diagnosis not present

## 2024-01-09 DIAGNOSIS — N2581 Secondary hyperparathyroidism of renal origin: Secondary | ICD-10-CM | POA: Diagnosis not present

## 2024-01-09 DIAGNOSIS — R10A1 Flank pain, right side: Secondary | ICD-10-CM | POA: Diagnosis not present

## 2024-03-03 ENCOUNTER — Telehealth: Payer: Self-pay | Admitting: *Deleted

## 2024-03-03 NOTE — Telephone Encounter (Signed)
 CALLED PATIENT'S SON - ANTHONY Delossantos TO INFORM OF CT FOR HIS MOM ON 03-10-24- ARRIVAL TIME- 4:45 PM @ [facility], NO RESTRICTIONS TO SCAN, PATIENT TO RECEIVE RESULTS FROM [facility] PERKINS ON 03-23-24 @ 1 PM VIA TELEPHONE, SPOKE WITH PATIENT'S SON- ANTHONY AND HE IS AWARE OF THESE APPTS. AND THE INSTRUCTIONS

## 2024-03-10 ENCOUNTER — Other Ambulatory Visit: Payer: Self-pay | Admitting: Radiation Oncology

## 2024-03-10 ENCOUNTER — Ambulatory Visit (HOSPITAL_COMMUNITY)
Admission: RE | Admit: 2024-03-10 | Discharge: 2024-03-10 | Attending: Radiation Oncology | Admitting: Radiation Oncology

## 2024-03-10 DIAGNOSIS — C3411 Malignant neoplasm of upper lobe, right bronchus or lung: Secondary | ICD-10-CM

## 2024-03-10 LAB — POCT I-STAT CREATININE: Creatinine, Ser: 1.8 mg/dL — ABNORMAL HIGH (ref 0.44–1.00)

## 2024-03-10 MED ORDER — IOHEXOL 300 MG/ML  SOLN: 75.0000 mL | Freq: Once | INTRAMUSCULAR | Status: DC | PRN

## 2024-03-16 NOTE — Progress Notes (Incomplete)
 Follow up call to discuss results from chest CT on 03/10/24.  IMPRESSION: 1. Presumed radiation-induced interstitial thickening within the right apex. More focal anterior apical soft tissue density is similar to the prior exam and most likely represents evolving radiation change. This can be reevaluated on follow up. 2. Mediastinal adenopathy which is similar to the prior exam but new/increased since 02/25/23. Cannot exclude nodal metastasis. Potential clinical strategies include CT follow up in 3 months versus further evaluation with PET. 3. No Change in a left upper lobe 9x8 mm pulmonary nodule. 4. Similar diffuse upper lobe predominant interstitial thickening, likely postinfectious/inflammatory. 5. Aortic atherosclerosis (ICD10-I70.0) and coronary artery atherosclerosis. 6. Pulmonary artery enlargement suggests pulmonary arterial hypertension.

## 2024-03-18 ENCOUNTER — Ambulatory Visit: Payer: Self-pay | Admitting: Radiation Oncology

## 2024-03-18 DIAGNOSIS — C3411 Malignant neoplasm of upper lobe, right bronchus or lung: Secondary | ICD-10-CM

## 2024-03-18 NOTE — Telephone Encounter (Addendum)
 I spoke with the patient regarding her recent CT scan on 03/10/24. She was unable to have IV contrast due to a istat creatinine of 1.8. She is seeing Dr. Dennise at [facility] and I will copy this to him on this as well.   We discussed the reassuring post treatment findings on her CT in the [facility], and she had suspicious appearing node at the time of her work up but her cytology from bronchoscopy was negative. Her nodes have been followed and stable though slightly enlarged, her most recent scan showed an increase in the right paratracheal node from 8 mm in November 2024 to 1.3 cm now, and subcarinal change from 1.2 cm in November 2024 to 1.4 cm now. Evaluation was limited due to lack of IV contrast.  Dr. Dewey has reviewed her scan and recommended a repeat CT in 4 months after personally reviewing her films. She is in agreement with this plan and I will order it without contrast given her renal function. If there becomes greater concern for her nodes in the future we could consider PET imaging. Of note she has noticed discomfort and an increase in the size of her right breast. She had a normal mammogram in April of this year but C density breasts and a family history of breast cancer. I will reach out to our navigators to see if they can help facilitate diagnostic imaging at the [facility] [place].     Donald KYM Husband, PAC

## 2024-03-23 ENCOUNTER — Ambulatory Visit: Admitting: Radiation Oncology

## 2024-04-03 ENCOUNTER — Ambulatory Visit
Admission: RE | Admit: 2024-04-03 | Discharge: 2024-04-03 | Disposition: A | Discharge status: Home / Self Care | Source: Ambulatory Visit | Attending: Radiation Oncology | Admitting: Radiation Oncology

## 2024-04-03 DIAGNOSIS — C3411 Malignant neoplasm of upper lobe, right bronchus or lung: Secondary | ICD-10-CM

## 2024-04-06 ENCOUNTER — Ambulatory Visit: Payer: Self-pay | Admitting: Radiation Oncology

## 2024-04-06 NOTE — Telephone Encounter (Signed)
 I spoke with the patient to review her recent mammogram. She is pleased that her studies were negative, and plans to resume screening mammogram in April, and her follow up CT chest for her history of lung cancer with me in April/early May. She will call if she has further questions or concerns sooner.

## 2024-04-20 ENCOUNTER — Telehealth: Payer: Self-pay | Admitting: *Deleted

## 2024-04-20 NOTE — Telephone Encounter (Signed)
 Called patient to inform of CT for 07-01-24- arrival time- 11:15 am @ [facility], no restrictions to scan, patient to receive results from PA Donald Husband on 07-13-24 @ 2:30 pm via telephone, spoke with patient and she is aware of these appts. and the instructions

## 2024-07-01 ENCOUNTER — Ambulatory Visit (HOSPITAL_COMMUNITY)

## 2024-07-13 ENCOUNTER — Ambulatory Visit: Admitting: Radiation Oncology
